# Patient Record
Sex: Female | Born: 1950
Health system: Southern US, Community
[De-identification: ages and names within clinical notes are randomized; demographics above are authoritative.]

## PROBLEM LIST (undated history)

## (undated) ENCOUNTER — Ambulatory Visit

## (undated) DIAGNOSIS — M1990 Unspecified osteoarthritis, unspecified site: Secondary | ICD-10-CM

## (undated) DIAGNOSIS — R531 Weakness: Secondary | ICD-10-CM

## (undated) DIAGNOSIS — J479 Bronchiectasis, uncomplicated: Secondary | ICD-10-CM

## (undated) DIAGNOSIS — F329 Major depressive disorder, single episode, unspecified: Secondary | ICD-10-CM

## (undated) DIAGNOSIS — N3941 Urge incontinence: Secondary | ICD-10-CM

## (undated) DIAGNOSIS — J45909 Unspecified asthma, uncomplicated: Secondary | ICD-10-CM

## (undated) DIAGNOSIS — E119 Type 2 diabetes mellitus without complications: Secondary | ICD-10-CM

## (undated) DIAGNOSIS — U071 COVID-19: Secondary | ICD-10-CM

## (undated) DIAGNOSIS — N390 Urinary tract infection, site not specified: Secondary | ICD-10-CM

## (undated) DIAGNOSIS — F5101 Primary insomnia: Secondary | ICD-10-CM

## (undated) DIAGNOSIS — R000 Tachycardia, unspecified: Secondary | ICD-10-CM

## (undated) DIAGNOSIS — G629 Polyneuropathy, unspecified: Secondary | ICD-10-CM

## (undated) DIAGNOSIS — I10 Essential (primary) hypertension: Secondary | ICD-10-CM

## (undated) DIAGNOSIS — G709 Myoneural disorder, unspecified: Secondary | ICD-10-CM

## (undated) DIAGNOSIS — B019 Varicella without complication: Secondary | ICD-10-CM

## (undated) DIAGNOSIS — K219 Gastro-esophageal reflux disease without esophagitis: Secondary | ICD-10-CM

## (undated) DIAGNOSIS — G4730 Sleep apnea, unspecified: Secondary | ICD-10-CM

## (undated) DIAGNOSIS — E782 Mixed hyperlipidemia: Secondary | ICD-10-CM

## (undated) DIAGNOSIS — G2581 Restless legs syndrome: Secondary | ICD-10-CM

## (undated) DIAGNOSIS — J189 Pneumonia, unspecified organism: Secondary | ICD-10-CM

## (undated) DIAGNOSIS — K589 Irritable bowel syndrome without diarrhea: Secondary | ICD-10-CM

## (undated) DIAGNOSIS — G603 Idiopathic progressive neuropathy: Secondary | ICD-10-CM

## (undated) DIAGNOSIS — F32A Depression, unspecified: Secondary | ICD-10-CM

## (undated) DIAGNOSIS — J9601 Acute respiratory failure with hypoxia: Secondary | ICD-10-CM

## (undated) HISTORY — DX: Acute respiratory failure with hypoxia: J96.01

## (undated) HISTORY — DX: Urge incontinence: N39.41

## (undated) HISTORY — DX: Irritable bowel syndrome, unspecified: K58.9

## (undated) HISTORY — DX: Bronchiectasis, uncomplicated: J47.9

## (undated) HISTORY — DX: Urinary tract infection, site not specified: N39.0

## (undated) HISTORY — DX: Type 2 diabetes mellitus without complications: E11.9

## (undated) HISTORY — DX: Gastro-esophageal reflux disease without esophagitis: K21.9

## (undated) HISTORY — DX: Primary insomnia: F51.01

## (undated) HISTORY — DX: Essential (primary) hypertension: I10

## (undated) HISTORY — DX: Major depressive disorder, single episode, unspecified: F32.9

## (undated) HISTORY — DX: Polyneuropathy, unspecified: G62.9

## (undated) HISTORY — PX: APPENDECTOMY: SHX54

## (undated) HISTORY — PX: REPLACEMENT TOTAL KNEE: SUR1224

## (undated) HISTORY — DX: Restless legs syndrome: G25.81

## (undated) HISTORY — DX: Depression, unspecified: F32.A

## (undated) HISTORY — DX: Mixed hyperlipidemia: E78.2

## (undated) HISTORY — PX: ABDOMINAL HYSTERECTOMY: SHX81

## (undated) HISTORY — DX: COVID-19: U07.1

## (undated) HISTORY — DX: Weakness: R53.1

## (undated) HISTORY — DX: Unspecified osteoarthritis, unspecified site: M19.90

## (undated) HISTORY — DX: Idiopathic progressive neuropathy: G60.3

## (undated) HISTORY — DX: Varicella without complication: B01.9

## (undated) HISTORY — PX: CHOLECYSTECTOMY: SHX55

## (undated) HISTORY — PX: TONSILLECTOMY: SUR1361

## (undated) HISTORY — DX: Tachycardia, unspecified: R00.0

## (undated) HISTORY — PX: HERNIA REPAIR: SHX51

---

## 2013-10-11 ENCOUNTER — Ambulatory Visit (INDEPENDENT_AMBULATORY_CARE_PROVIDER_SITE_OTHER): Payer: Self-pay | Admitting: Neurology

## 2013-10-11 ENCOUNTER — Encounter: Payer: Self-pay | Admitting: Neurology

## 2013-10-11 VITALS — BP 126/76 | HR 106 | Ht 63.0 in | Wt 194.0 lb

## 2013-10-11 DIAGNOSIS — R292 Abnormal reflex: Secondary | ICD-10-CM

## 2013-10-11 DIAGNOSIS — R262 Difficulty in walking, not elsewhere classified: Secondary | ICD-10-CM

## 2013-10-11 DIAGNOSIS — M62838 Other muscle spasm: Secondary | ICD-10-CM

## 2013-10-11 MED ORDER — CYCLOBENZAPRINE HCL 5 MG PO TABS: 5.0000 mg | ORAL_TABLET | Freq: Three times a day (TID) | ORAL | Status: DC | PRN

## 2013-10-11 NOTE — Patient Instructions (Signed)
1. EMG/NCV upper and lower extremity 2. Start Flexeril 5mg  at bedtime for muscle spasms 3. We will obtain records and discs of studies done at [facility] 4. Follow-up after EMG/NCV

## 2013-10-11 NOTE — Progress Notes (Signed)
NEUROLOGY CONSULTATION NOTE  Hailey Greene MRN: [id number] DOB: 01/29/1951  Referring provider: Dr. Malva Cogan [facility]) Primary care provider: none  Reason for consult:  Weakness, difficulty walking  Dear Dr Addison Lank:  Thank you for your kind referral of Hailey Greene for consultation of the above symptoms. Although her history is well known to you, please allow me to reiterate it for the purpose of our medical record. Records from [facility] ER were reviewed.  Patient was at [facility] x 2, records unavailable for review at this time.  HISTORY OF PRESENT ILLNESS: This is a pleasant 63 year old right-handed woman with no significant past medical history, no medical follow-up due to insurance issues, who reports that symptoms started last winter when she got depressed and saw a physician in [place] in June to ask for medication.  She does not recall medications prescribed, per records she was prescribed Celexa, Wellbutrin, Atarax, Ciprofloxacin, and Prilosec.  She states she was active, mobile, and independent prior to taking these medications, then she one day woke up and could not walk.  She stopped all the medications, however after a week continued to have difficulties ambulating.  She had been to [facility] twice, per [facility] records she was diagnosed with a UTI and anxiety on 6/25, CBC normal, Na, K, Cl low and treated, LFTs, CK, ammonia normal.  CT head negative.  She returned on 7/1 per records labs normal, MRI brain 6/29 negative.  She tells me that she had imaging of her spine but no results are available.  She had PT for 2 days in [facility] and was discharged home with PT exercises.  Her husband brought her to [facility] ER on 10/04/13 due to worsening mobility, now wheelchair-bound, and difficulty taking care of her.    She reports that she can move all her extremities without difficulty but cannot ambulate, "they won't  go where they are supposed to."  She has constant tingling on the right side of her face and both arms up to her elbows.  She denies any paresthesias or pain in her legs.  She has had difficulty sleeping because of uncontrollable kicking in both feet, and has been taking over the counter sleep medications.  She denies any bowel or bladder dysfunction, no perineal numbness.  She denies any neck or back pain.  She denies any shortness of breath except when she is moving around more. No dysarthria, dysphagia, ptosis, headaches, dizziness, diplopia.  No family history of similar symptoms.  She does endorse "mild depression" after her dogs died and her husband being sick recently.    PAST MEDICAL HISTORY: No past medical history on file.  PAST SURGICAL HISTORY: No past surgical history on file.  MEDICATIONS: No current outpatient prescriptions on file prior to visit.   No current facility-administered medications on file prior to visit.    ALLERGIES: No Known Allergies  FAMILY HISTORY: No family history on file.  SOCIAL HISTORY: History   Social History  . Marital Status: Married    Spouse Name: N/A    Number of Children: N/A  . Years of Education: N/A   Occupational History  . Not on file.   Social History Main Topics  . Smoking status: Former Smoker    Types: Cigarettes  . Smokeless tobacco: Not on file  . Alcohol Use: No  . Drug Use: No  . Sexual Activity: Not on file   Other Topics Concern  .  Not on file   Social History Narrative  . No narrative on file    REVIEW OF SYSTEMS: Constitutional: No fevers, chills, or sweats, no generalized fatigue, change in appetite Eyes: No visual changes, double vision, eye pain Ear, nose and throat: No hearing loss, ear pain, nasal congestion, sore throat Cardiovascular: No chest pain, palpitations Respiratory:  No shortness of breath at rest or with exertion, wheezes GastrointestinaI: No nausea, vomiting, diarrhea, abdominal pain,  fecal incontinence Genitourinary:  No dysuria, urinary retention or frequency Musculoskeletal:  No neck pain, back pain Integumentary: No rash, pruritus, skin lesions Neurological: as above Psychiatric: No depression, insomnia, anxiety Endocrine: No palpitations, fatigue, diaphoresis, mood swings, change in appetite, change in weight, increased thirst Hematologic/Lymphatic:  No anemia, purpura, petechiae. Allergic/Immunologic: no itchy/runny eyes, nasal congestion, recent allergic reactions, rashes  PHYSICAL EXAM: Filed Vitals:   10/11/13 1029  BP: 126/76  Pulse: 106   General: No acute distress Head:  Normocephalic/atraumatic Eyes: Fundoscopic exam shows bilateral sharp discs, no vessel changes, exudates, or hemorrhages Neck: supple, no paraspinal tenderness, full range of motion Back: No paraspinal tenderness Heart: regular rate and rhythm Lungs: Clear to auscultation bilaterally. Vascular: No carotid bruits. Skin/Extremities: No rash, no edema Neurological Exam: Mental status: alert and oriented to person, place, and time, no dysarthria or aphasia, Fund of knowledge is appropriate.  Recent and remote memory are intact.  Attention and concentration are normal.    Able to name objects and repeat phrases. Cranial nerves: CN I: not tested CN II: pupils equal, round and reactive to light, visual fields intact, fundi unremarkable. CN III, IV, VI:  full range of motion, no nystagmus, no ptosis CN V: facial sensation intact CN VII: upper and lower face symmetric CN VIII: hearing intact to finger rub CN IX, X: gag intact, uvula midline CN XI: sternocleidomastoid and trapezius muscles intact CN XII: tongue midline Bulk & Tone: normal, no fasciculations. Motor: 5/5 throughout with no pronator drift. Sensation: intact to light touch, cold, pin, vibration and joint position sense.  No extinction to double simultaneous stimulation.   Deep Tendon Reflexes: unable to elicit reflexes  throughout, no ankle clonus Plantar responses: downgoing bilaterally Cerebellar: no incoordination on finger to nose, heel to shin. No dysdiadochokinesia Gait: patient needed 2-person assist to stand up, she was able to hold herself up standing in place, then unable to take steps without support Tremor: none  IMPRESSION: This is a 63 year old right-handed woman with no significant past medical history presenting for inability to ambulate over the past month.  Her neurological exam shows 5/5 strength when sitting down, sensory exam normal, with note of absent reflexes throughout.  She is able to stand with assist, holds herself up when standing, but unable to take steps without support (?ataxic but difficult to determine due to limited ability to take steps). No other cerebellar signs on exam.  The etiology of her symptoms is unclear, she reports having an MRI of the brain and spine, however only MRI brain report is available from [facility] notes.  [facility] from [facility] will be requested for review.  EMG/NCV of the upper and lower extremity will be ordered to assess for polyradiculoneuropathy.  She is concerned about the spasms in her legs at night and will try a muscle relaxant to hopefully help with sleep as well.  She will continue with PT home exercises and will follow-up after the EMG.  Thank you for allowing me to participate in the care of  this patient. Please do not hesitate to call for any questions or concerns.   Patrcia Dolly, M.D.

## 2013-10-12 ENCOUNTER — Encounter: Payer: Self-pay | Admitting: Neurology

## 2013-10-12 DIAGNOSIS — R292 Abnormal reflex: Secondary | ICD-10-CM | POA: Insufficient documentation

## 2013-10-12 DIAGNOSIS — M62838 Other muscle spasm: Secondary | ICD-10-CM | POA: Insufficient documentation

## 2013-10-12 DIAGNOSIS — R262 Difficulty in walking, not elsewhere classified: Secondary | ICD-10-CM | POA: Insufficient documentation

## 2013-10-23 ENCOUNTER — Telehealth: Payer: Self-pay | Admitting: Neurology

## 2013-10-23 NOTE — Telephone Encounter (Signed)
Please let him know I would agree with seeing Behavioral Health, can you pls send referral. Also, we have not received records from [facility], pls request again. Thanks!

## 2013-10-23 NOTE — Telephone Encounter (Signed)
Pt's POA/husband called with concerns regarding patients mental health. He says she seems to be getting worse, still requiring daily help from a hired caregiver. Pt still having nightmares. Spouse reports pt is speaking to her father who has passed away. Spouse is very concerned and worried. Wonders if she needs sometime of Mental Health referral. Please call [phone] / Sherri S.

## 2013-10-23 NOTE — Telephone Encounter (Signed)
Please advise 

## 2013-10-30 ENCOUNTER — Other Ambulatory Visit: Payer: Self-pay | Admitting: *Deleted

## 2013-10-30 DIAGNOSIS — R4689 Other symptoms and signs involving appearance and behavior: Secondary | ICD-10-CM

## 2013-10-30 NOTE — Telephone Encounter (Signed)
Patients spouse notified of referral being made to [facility]

## 2013-11-11 ENCOUNTER — Ambulatory Visit: Payer: Self-pay | Admitting: Neurology

## 2013-11-20 ENCOUNTER — Ambulatory Visit (INDEPENDENT_AMBULATORY_CARE_PROVIDER_SITE_OTHER): Payer: Self-pay | Admitting: Neurology

## 2013-11-20 ENCOUNTER — Encounter: Payer: Self-pay | Admitting: Neurology

## 2013-11-20 DIAGNOSIS — G610 Guillain-Barre syndrome: Secondary | ICD-10-CM

## 2013-11-20 DIAGNOSIS — R262 Difficulty in walking, not elsewhere classified: Secondary | ICD-10-CM

## 2013-11-20 DIAGNOSIS — M62838 Other muscle spasm: Secondary | ICD-10-CM

## 2013-11-20 DIAGNOSIS — R292 Abnormal reflex: Secondary | ICD-10-CM

## 2013-11-20 NOTE — Procedures (Signed)
[facility]  [address] [place], [address]  [place], [place] [address] Tel: [phone] Fax:  [phone] Test Date:  11/20/2013  Patient: Hailey Greene DOB: 01/12/1951 Physician: Nita Sickle  Sex: Female Height:  Ref Phys: Patrcia Dolly  ID#: [id number] Temp: 34.0C Technician: Ala Bent R. NCS T.   Patient Complaints: Patient is a 63 year old female here for evaluation of bilateral leg and hand numbness and facial numbness.  NCV & EMG Findings: Extensive electrodiagnostic testing of the right upper and lower extremity shows:  1. Nearly all sensory responses including the median, ulnar, sural, and superficial peroneal sensory responses are absent. The radial sensory response is reduced in amplitude (R3.4 V). 2. Median motor study recorded from abductor pollicis brevis (APB) muscles obtained by stimulating the nerve at the wrist normal response, however proximal stimulation showed greater than 50% reduction in amplitude with decreased conduction velocity (Elbow-Wrist, 41 m/s), suggestive of conduction block in the forearm.  Additionally, recording from APB, the ulnar nerve was stimulated at the wrist and cubital tunnel and showed motor response, showing dual innervation of APB by median and ulnar nerves, consistent with an Riche-Cannieu anastomosis (RCA), a normal variant.  Right ulnar motor response recording at the abductor digiti minimi shows normal amplitude and latency, however there is decreased conduction velocity (A Elbow-B Elbow, 45 m/s). The ulnar motor response recording at the first dorsal interosseous muscle shows reduced amplitude (R4.71mV). 3. In the lower extremity, peroneal motor response shows reduced amplitude and conduction velocity at the extensor digitorum brevis and when recording at the tibialis anterior, there is prolonged distal onset latency (4.6 ms) and reduced amplitude (2.1 mV). The tibial motor response is within normal limits. 4. F wave responses  are within normal limits. H-reflex studies indicate that the left tibial H-reflex has prolonged latency (36.60 ms) and the right is absent.   5. Needle electrode examination shows chronic motor axon loss changes affecting the gastrocnemius, flexor digitorum longus and abductor digiti minimi muscles with active changes isolated to the medial gastrocnemius.   Impression: The electrophysiologic findings are most consistent with a patchy and subacute polyradiculoneuropathy, predominantly affecting sensory nerves greater than motor nerves. With respect to sensory abnormalities, these findings are severe in degree electrically.  Incidentally, there is evidence of a right Riche-Cannieu anastomosis, a normal variant.    ___________________________ Nita Sickle    Nerve Conduction Studies Anti Sensory Summary Table   Stim Site NR Peak (ms) Norm Peak (ms) P-T Amp (V) Norm P-T Amp  Right Median Anti Sensory (2nd Digit)  Wrist NR  <3.8  >10  Right Radial Anti Sensory (Base 1st Digit)  Wrist    2.8 <2.8 3.4 >10  Right Sup Peroneal Anti Sensory (Ant Lat Mall)  12 cm NR  <4.6  >3  Right Sural Anti Sensory (Lat Mall)  Calf NR  <4.6  >3  Right Ulnar Anti Sensory (5th Digit)  Wrist NR  <3.2  >5   Motor Summary Table   Stim Site NR Onset (ms) Norm Onset (ms) O-P Amp (mV) Norm O-P Amp Site1 Site2 Delta-0 (ms) Dist (cm) Vel (m/s) Norm Vel (m/s)  Right Median Motor (Abd Poll Brev)  Wrist    4.0 <4.0 10.7 >5 Elbow Wrist 5.6 23.0 41 >50  Elbow    9.6  5.5  Ulnar-wrist Elbow 4.9 0.0    Ulnar-wrist    4.7  7.9  Ulnar A-elbow Ulnar-wrist 4.8 0.0    Ulnar A-elbow    9.5  5.8         Right Peroneal Motor (Ext Dig Brev)  Ankle    4.7 <6.0 1.6 >2.5 B Fib Ankle 8.0 30.0 38 >40  B Fib    12.7  1.2  Poplt B Fib 2.7 9.0 33 >40  Poplt    15.4  1.0         Right Peroneal TA Motor (Tib Ant)  Fib Head    4.6 <4.5 2.1 >3 Poplit Fib Head 1.6 8.5 53 >40  Poplit    6.2  2.0         Post-exercise    2.7  2.7          Right Tibial Motor (Abd Hall Brev)  Ankle    4.4 <6.0 7.9 >4 Knee Ankle 8.9 37.0 42 >40  Knee    13.3  4.0         Right Ulnar Motor (Abd Dig Minimi)  Wrist    2.2 <3.1 7.7 >7 B Elbow Wrist 3.6 19.5 54 >50  B Elbow    5.8  6.9  A Elbow B Elbow 2.2 10.0 45 >50  A Elbow    8.0  6.9         Right Ulnar (FDI) Motor (1st DI)  Wrist    3.4 <4.5 4.1 >7         F Wave Studies   NR F-Lat (ms) Lat Norm (ms) L-R F-Lat (ms)  Right Tibial (Mrkrs) (Abd Hallucis)     54.54 <55   Right Ulnar (Mrkrs) (Abd Dig Min)     28.03 <33    H Reflex Studies   NR H-Lat (ms) Lat Norm (ms) L-R H-Lat (ms)  Left Tibial (Gastroc)     36.60 <35   Right Tibial (Gastroc)  NR  <35    EMG   Side Muscle Ins Act Fibs Psw Fasc Number Recrt Dur Dur. Amp Amp. Poly Poly. Comment  Right AntTibialis Nml Nml Nml Nml Nml Nml Nml Nml Nml Nml Nml Nml N/A  Right Gastroc Nml 1+ Nml Nml 1- Mod Nml Nml Nml Nml Nml Nml N/A  Right Flex Dig Long Nml Nml Nml Nml 1- Mod-R Some 1+ Some 1+ Nml Nml N/A  Right RectFemoris Nml Nml Nml Nml Nml Nml Nml Nml Nml Nml Nml Nml N/A  Right GluteusMed Nml Nml Nml Nml Nml Nml Nml Nml Nml Nml Nml Nml N/A  Right Lumbo Parasp Low Nml Nml Nml Nml Nml Nml Nml Nml Nml Nml Nml Nml N/A  Right 1stDorInt Nml Nml Nml Nml Nml Nml Nml Nml Nml Nml Nml Nml N/A  Right Abd Poll Brev Nml Nml Nml Nml Nml Nml Few 1+ Few 1- Nml Nml N/A  Right FlexPolLong Nml Nml Nml Nml Nml Nml Nml Nml Nml Nml Nml Nml N/A  Right Ext Indicis Nml Nml Nml Nml Nml Nml Nml Nml Nml Nml Nml Nml N/A  Right PronatorTeres Nml Nml Nml Nml Nml Nml Nml Nml Nml Nml Nml Nml N/A  Right Biceps Nml Nml Nml Nml Nml Nml Nml Nml Nml Nml Nml Nml N/A  Right Triceps Nml Nml Nml Nml Nml Nml Nml Nml Nml Nml Nml Nml N/A  Right Deltoid Nml Nml Nml Nml Nml Nml Nml Nml Nml Nml Nml Nml N/A  Right Cervical Parasp Low Nml Nml Nml Nml Nml Nml Nml Nml Nml Nml Nml Nml N/A  Right FlexDigProf 4,5 Nml Nml Nml Nml Nml Nml Nml Nml Nml Nml Nml Nml N/A  Right ABD Dig Min  Nml Nml Nml Nml 1- Mod Few 1+ Nml Nml Nml Nml N/A      Waveforms:

## 2013-11-20 NOTE — Progress Notes (Signed)
Hailey Greene brought in a note she wrote with more symptoms that have developed since her last visit with Dr. Karel Jarvis, she was here today for NCV/EMG studies. Note states as follows: hallucinations, dizziness, from elbows to fingertips tingling, legs and feet hurt badly.

## 2013-11-21 ENCOUNTER — Telehealth: Payer: Self-pay | Admitting: Neurology

## 2013-11-21 ENCOUNTER — Other Ambulatory Visit: Payer: Self-pay | Admitting: Family Medicine

## 2013-11-21 DIAGNOSIS — G610 Guillain-Barre syndrome: Secondary | ICD-10-CM

## 2013-11-21 NOTE — Telephone Encounter (Signed)
Called patient. Lmovm to return my call.

## 2013-11-21 NOTE — Telephone Encounter (Signed)
1-1/2 wks ago hallucinations , different each time. When she looks outside, straw people walking around her yard, not scary. Next time saw baby carriage. One time saw a guy with horse. No auditory hallucinations. When she turns, it goes away. None inside the house. Also started to have tingling on the both arms. Legs hurt, at night especially. Discussed EMG findings showing polyradiculoneuropathy. Would do more tests to further evaluated for cause of symptoms.  Tiffany, pls schedule MRI C-spine and L-spine with and without contrast for dx: polyradiculoneuropathy.  Also, pls schedule a lumbar puncture, send CSF for cell count, glucose, protein, IgG Index, oligoclonal bands, ACE level, Lyme Ab, flow cytometry, cytology.  She has a f/u on Monday, but let's get her scheduled for the tests. Thanks

## 2013-11-22 NOTE — Telephone Encounter (Signed)
Orders have been placed. Patient will receive a call from [facility] about LP appt. MRI scans have been scheduled for 12/09/13 @ [facility]. Patient has been made aware of MRI appts. F/U with Dr. Karel Jarvis has been moved to 12/25/13.

## 2013-11-25 ENCOUNTER — Ambulatory Visit: Payer: Self-pay | Admitting: Neurology

## 2013-11-28 ENCOUNTER — Encounter: Payer: Self-pay | Admitting: Neurology

## 2013-12-04 ENCOUNTER — Other Ambulatory Visit (HOSPITAL_COMMUNITY)
Admission: RE | Admit: 2013-12-04 | Discharge: 2013-12-04 | Disposition: A | Discharge status: Home / Self Care | Payer: Self-pay | Source: Ambulatory Visit | Attending: Neurology | Admitting: Neurology

## 2013-12-04 ENCOUNTER — Ambulatory Visit
Admission: RE | Admit: 2013-12-04 | Discharge: 2013-12-04 | Disposition: A | Discharge status: Home / Self Care | Payer: Self-pay | Source: Ambulatory Visit | Attending: Neurology | Admitting: Neurology

## 2013-12-04 ENCOUNTER — Other Ambulatory Visit: Payer: Self-pay | Admitting: *Deleted

## 2013-12-04 DIAGNOSIS — R292 Abnormal reflex: Secondary | ICD-10-CM

## 2013-12-04 DIAGNOSIS — R262 Difficulty in walking, not elsewhere classified: Secondary | ICD-10-CM

## 2013-12-04 DIAGNOSIS — M62838 Other muscle spasm: Secondary | ICD-10-CM

## 2013-12-04 DIAGNOSIS — G610 Guillain-Barre syndrome: Secondary | ICD-10-CM

## 2013-12-04 LAB — CSF CELL COUNT WITH DIFFERENTIAL
RBC COUNT CSF: 50 /mm3 — AB
Tube #: 1
WBC, CSF: 1 /mm3 (ref 0–5)

## 2013-12-04 LAB — PROTEIN, CSF: TOTAL PROTEIN, CSF: 30 mg/dL (ref 15–45)

## 2013-12-04 LAB — GLUCOSE, CSF: GLUCOSE CSF: 58 mg/dL (ref 43–76)

## 2013-12-04 MED ORDER — DIAZEPAM 5 MG PO TABS
10.0000 mg | ORAL_TABLET | Freq: Once | ORAL | Status: AC
  Administered 2013-12-04: 10 mg via ORAL

## 2013-12-04 NOTE — Progress Notes (Signed)
Two tiger-topped tubes of blood drawn for LP labs from right [facility] space without difficulty; site unremarkable.  Discharge instructions explained to patient and her husband.  Questions answered.  jkl

## 2013-12-04 NOTE — Discharge Instructions (Signed)
Lumbar Puncture Discharge Instructions  1. Go home and rest quietly for the next 24 hours.  It is important to lie flat for the next 24 hours.  Get up only to go to the restroom.  You may lie in the bed or on a couch on your back, your stomach, your left side or your right side.  You may have one pillow under your head.  You may have pillows between your knees while you are on your side or under your knees while you are on your back.  2. DO NOT drive today.  Recline the seat as far back as it will go, while still wearing your seat belt, on the way home.  3. You may get up to go to the bathroom as needed.  You may sit up for 10 minutes to eat.  You may resume your normal diet and medications unless otherwise indicated.  Drink plenty of extra fluids today and tomorrow.  4. The incidence of a spinal headache with nausea and/or vomiting is about 5% (one in 20 patients).  If you develop a headache, lie flat and drink plenty of fluids until the headache goes away.  Caffeinated beverages may be helpful.  If you develop severe nausea and vomiting or a headache that does not go away with flat bed rest, please call the physician who referred you here.  5. You may resume normal activities after your 24 hours of bed rest is over; however, do not exert yourself strongly or do any heavy lifting tomorrow.  Please call us at [phone] with any questions.  6. Call your physician for a follow-up appointment.

## 2013-12-06 LAB — ANGIOTENSIN CONVERTING ENZYME, CSF: ANGIO CONVERT ENZYME: 11 U/L (ref <=15)

## 2013-12-07 LAB — CSF IGG: IgG, CSF: 2.6 mg/dL (ref 0.8–7.7)

## 2013-12-09 ENCOUNTER — Ambulatory Visit (HOSPITAL_COMMUNITY)
Admission: RE | Admit: 2013-12-09 | Discharge: 2013-12-09 | Disposition: A | Discharge status: Home / Self Care | Payer: Self-pay | Source: Ambulatory Visit | Attending: Neurology | Admitting: Neurology

## 2013-12-09 DIAGNOSIS — G610 Guillain-Barre syndrome: Secondary | ICD-10-CM

## 2013-12-09 DIAGNOSIS — M47812 Spondylosis without myelopathy or radiculopathy, cervical region: Secondary | ICD-10-CM | POA: Insufficient documentation

## 2013-12-09 LAB — POCT I-STAT CREATININE: Creatinine, Ser: 1 mg/dL (ref 0.50–1.10)

## 2013-12-09 MED ORDER — GADOBENATE DIMEGLUMINE 529 MG/ML IV SOLN
20.0000 mL | Freq: Once | INTRAVENOUS | Status: AC | PRN
  Administered 2013-12-09: 18 mL via INTRAVENOUS

## 2013-12-10 LAB — OLIGOCLONAL BANDS, CSF + SERM

## 2013-12-11 ENCOUNTER — Telehealth: Payer: Self-pay | Admitting: Neurology

## 2013-12-11 NOTE — Telephone Encounter (Signed)
Discussed unremarkable MRI cervical and lumbar spine with patient. Discussed lumbar puncture results which are normal. I would like for her to see our neuromuscular specialist Dr. Allena Katz to weigh in for further evaluation and treatment. Patient expressed understanding.

## 2013-12-23 LAB — B. BURGDORFI ANTIBODIES, CSF: LYME AB: NEGATIVE

## 2013-12-24 ENCOUNTER — Ambulatory Visit (INDEPENDENT_AMBULATORY_CARE_PROVIDER_SITE_OTHER): Payer: Self-pay | Admitting: Neurology

## 2013-12-24 ENCOUNTER — Encounter: Payer: Self-pay | Admitting: Neurology

## 2013-12-24 ENCOUNTER — Telehealth: Payer: Self-pay | Admitting: Neurology

## 2013-12-24 VITALS — BP 120/74 | HR 102 | Ht 63.0 in | Wt 184.2 lb

## 2013-12-24 DIAGNOSIS — G609 Hereditary and idiopathic neuropathy, unspecified: Secondary | ICD-10-CM

## 2013-12-24 MED ORDER — NORTRIPTYLINE HCL 10 MG PO CAPS: 10.0000 mg | ORAL_CAPSULE | Freq: Every day | ORAL | Status: DC

## 2013-12-24 NOTE — Telephone Encounter (Signed)
Patient's husband notified that Rx has been sent to pharmacy.

## 2013-12-24 NOTE — Telephone Encounter (Signed)
Pt wants to knows if rx has been called in

## 2013-12-24 NOTE — Progress Notes (Signed)
[facility] Clinic Note - Initial Visit   Date: 12/24/2013  Hailey Greene MRN: [id number] DOB: 10/27/1950   Dear Dr. Delice Lesch:  Thank you for your kind referral of Hailey Greene for consultation of sensory neuropathy. Although her history is well known to you, please allow [place] to reiterate it for the purpose of our medical record. The patient was accompanied to the clinic by self.   History of Present Illness: Hailey Greene is a 63 y.o. right-handed Caucasian female with history of depression presenting for evaluation of sensory ataxia and gait problems.    She reports suffering from depression during early 2015.  Her husband suffered a heart attack in December and then kidney stone in March 2015.  She also lost two of her dogs very close to each other. She went to see her PCP because of problems with low energy, depression, and abdominal pain.  She was started on Celexa, Wellbutrin, Atarax, Ciprofloxacin, and Prilosec. She took it for a week and reported that it was not working. She was switched to another medication.  Prior to taking these medications, she was very active and healthy with no medical problems.  Overnight, she developed generalized shaking of her body and weakness.  Her husband and friend carried her to the car and took her to the emergency department at which time all her medications were stopped. She was readmitted one week later to [facility] and was diagnosed with UTI and anxiety on 6/25.  Labs including CBC normal, Na, K, Cl low and treated, LFTs, CK, ammonia normal. CT head negative. She returned on 7/1 per records labs normal, MRI brain 6/29 negative. She had PT for 2 days in [facility] and was discharged home with PT exercises. Again, she was admitted on 10/04/13 due to worsening mobility, now wheelchair-bound, and difficulty taking care of her.   Currently, she feels that she her legs are stronger than before.  She is  able to use a walker at home, but also has a wheelchair.    She is able to feed herself, bathe, and dress herself. She has not done any rehab because of financial reasons.  Initially she was having positional dizziness, but that has improved over the past few months.  She has numbness/tingling of the hands only and says that sensation of her fingers are like "brillo pads".  Denies any abnormal sensation of the feet.  She had difficulty with fine motor movement and is dropping things.  She had one fall last week and was able to stand up herself.    She is not update with her cancer screening.  She reports drinking 3 glasses of wine per day from December until April.  She saw Dr. Delice Lesch for these complaints in early July.  EMG performed by my self was concerning for sensory polyradiculoneuropathy so she underwent CSF testing which did not show any signs of inflammation or elevated protein.  Patient referred to be for further evaluation.   PMHx: None  PSHx: Appendectomy, hysterectomy, and tonsillectomy  Allergies:  NKDA  Medication:  mulitivitamin  Social History:  Lives with husband of 17 years. No children.  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  She quit smoking in 2010 and was previously smoking 1ppd for 5 years.  Highest level of education:  high school  Family history:  No family history of neuropathy.    Review of Systems:  CONSTITUTIONAL: No fevers, chills, night sweats, or weight loss.   EYES: No visual changes or eye pain ENT: No hearing changes.  No history of nose bleeds.   RESPIRATORY: No cough, wheezing and shortness of breath.   CARDIOVASCULAR: Negative for chest pain, and palpitations.   GI: Negative for abdominal discomfort, blood in stools or black stools.  No recent change in bowel habits.   GU:  No history of incontinence.   MUSCLOSKELETAL: +history of joint pain or swelling.  No myalgias.     SKIN: Negative for lesions, rash, and itching.   HEMATOLOGY/ONCOLOGY: Negative for prolonged bleeding, bruising easily, and swollen nodes.  No history of cancer.   ENDOCRINE: Negative for cold or heat intolerance, polydipsia or goiter.   PSYCH:  +depression or anxiety symptoms.   NEURO: As Above.   Vital Signs:  BP 120/74  Pulse 102  Ht '5\' 3"'  (1.6 m)  Wt 184 lb 4 oz (83.575 kg)  BMI 32.65 kg/m2  SpO2 97%  Neurological Exam: MENTAL STATUS including orientation to time, place, person, recent and remote memory, attention span and concentration, language, and fund of knowledge is normal.  Speech is not dysarthric.  CRANIAL NERVES: II:  No visual field defects.  Unremarkable fundi.   III-IV-VI: Pupils equal round and reactive to light.  Normal conjugate, extra-ocular eye movements in all directions of gaze.  No nystagmus.  No ptosis. V:  Normal facial sensation.   VII:  Normal facial symmetry and movements.  No pathologic facial reflexes.  VIII:  Normal hearing and vestibular function.   IX-X:  Normal palatal movement.   XI:  Normal shoulder shrug and head rotation.   XII:  Normal tongue strength and range of motion, no deviation or fasciculation.  MOTOR:  No atrophy, fasciculations or abnormal movements.  No pronator drift.  Tone is normal.    Right Upper Extremity:    Left Upper Extremity:    Deltoid  5/5   Deltoid  5/5   Biceps  5/5   Biceps  5/5   Triceps  5/5   Triceps  5/5   Wrist extensors  5/5   Wrist extensors  5/5   Wrist flexors  5/5   Wrist flexors  5/5   Finger extensors  5/5   Finger extensors  5/5   Finger flexors  5/5   Finger flexors  5/5   Dorsal interossei  4/5   Dorsal interossei  4/5   Abductor pollicis  5/5   Abductor pollicis  5/5   Tone (Ashworth scale)  0  Tone (Ashworth scale)  0   Right Lower Extremity:    Left Lower Extremity:    Hip flexors  4/5   Hip flexors  4/5   Adductor 4/5  Adductor 4/5  Hip extensors  5/5   Hip extensors  5/5   Knee  flexors  5/5   Knee flexors  5/5   Knee extensors  5/5   Knee extensors  5/5   Dorsiflexors  5/5   Dorsiflexors  5/5   Plantarflexors  5/5   Plantarflexors  5/5   Toe extensors  5/5   Toe extensors  5/5   Toe flexors  5/5   Toe flexors  5/5   Tone (Ashworth scale)  0  Tone (Ashworth scale)  0   MSRs:  Right  Left brachioradialis 2+  brachioradialis 2+  biceps 2+  biceps 2+  triceps 2+  triceps 2+  patellar 0  Patellar 0  ankle jerk 0  ankle jerk 0  Hoffman no  Hoffman no  plantar response down  plantar response down   SENSORY:  Pin prick and vibration diminished distal to knees bilaterally.  COORDINATION/GAIT: Normal finger-to- nose-finger and heel-to-shin.  Intact rapid alternating movements bilaterally.  Able to rise from a chair with using arms, but too unsteady to walk.   Data: CSF 12/09/2013: W1 R50 G58  P30, Lyme negative, ACE 11, IgG inex 2.6, OCB none, cytology neg  EMG 11/20/2913: The electrophysiologic findings are most consistent with a patchy and subacute polyradiculoneuropathy, predominantly affecting sensory nerves greater than motor nerves. With respect to sensory abnormalities, these findings are severe in degree electrically.  Incidentally, there is evidence of a right Riche-Cannieu anastomosis, a normal variant.   MRI lumbar spine wwo contrast 12/09/2013: 1. Moderate facet disease at L4-5 with bilateral facet joint effusions and synovial enhancement. There is an extraforaminal annular fissure on the left without resulting L4 nerve root encroachment.  2. Otherwise mild lumbar spondylosis with disc bulging and facet hypertrophy from L1-2 through L3-4. No significant spinal stenosis or nerve root encroachment.   MRI cervical spine wwo contrast 12/09/2013:  Mild multilevel cervical spondylosis    IMPRESSION: Mrs. Woodstock is a 63 year-old female presenting for evaluation of subacute onset of weakness and  sensory loss, starting in April 2015.  Around the same time, she was being treated for depression and UTI and her symptoms started very abruptly after taking these medications (Celexa, Wellbutrin, Atarax, Ciprofloxacin, and Prilosec), but she does not recall specifically if there was any particular one which may have triggered symptoms.  Her neurological exam shows a sensory neuropathy affecting the hands and feet which is consistent with her EMG (severe polyradiculoneuropathy affecting upper and lower extremity), except that there is also proximal leg weakness. Imaging shows multilevel disc bulge without spinal stenosis involving the lumbar region, which seems too mild to be causing her proximal leg weakness.  Her CSF studies do not show elevated protein or inflammatory changes, making AIDP/GBS less likely.  Several factors that could possibly contribute to sensory neuropathy:  (1) She reports to drinking 3 glasses of wine nightly for several months prior to onset of symptoms (December - April), (2) medication effect? (3) and need to exclude other possibilities such as vitamin deficiencies, autoimmune disorders (Srjogren's, Lupus), celiac disease, heavy metal toxicity, and possibly paraneoplastic sensory neuropathy, also.     PLAN/RECOMMENDATIONS:  1.  Check ESR, vitamin B12, vitamin B1, vitamin B6, ANA, SSA/B, celiac panel, copper, heavy metal screen, TSH, SPEP/UPEP with IFE, glucose tolerance test 2.  Going forward consider checking cryoglobulins, ANCA, RPR 3.  Start physical therapy for leg strengthening 4.  Encouraged to have cancer screening such as mammogram and colonoscopy by her PCP 5.  Return to clinic in 2-3 months.   The duration of this appointment visit was 60 minutes of face-to-face time with the patient.  Greater than 50% of this time was spent in counseling, explanation of diagnosis, planning of further management, and coordination of care.   Thank you for allowing me to  participate in patient's care.  If I can answer any additional questions, I would be pleased to do so.    Sincerely,    Donika K. Posey Pronto, DO

## 2013-12-25 ENCOUNTER — Other Ambulatory Visit: Payer: Self-pay | Admitting: *Deleted

## 2013-12-25 ENCOUNTER — Ambulatory Visit: Payer: Self-pay | Admitting: Neurology

## 2013-12-25 DIAGNOSIS — G609 Hereditary and idiopathic neuropathy, unspecified: Secondary | ICD-10-CM

## 2013-12-25 NOTE — Progress Notes (Signed)
Labs ordered and patient notified.  She said she would come in for labs at the beginning of next week.

## 2014-01-07 ENCOUNTER — Telehealth: Payer: Self-pay | Admitting: *Deleted

## 2014-01-07 ENCOUNTER — Telehealth: Payer: Self-pay | Admitting: Neurology

## 2014-01-07 ENCOUNTER — Other Ambulatory Visit: Payer: Self-pay | Admitting: Neurology

## 2014-01-07 LAB — TSH: TSH: 3.984 u[IU]/mL (ref 0.350–4.500)

## 2014-01-07 LAB — VITAMIN B12: VITAMIN B 12: 430 pg/mL (ref 211–911)

## 2014-01-07 LAB — SEDIMENTATION RATE: SED RATE: 40 mm/h — AB (ref 0–22)

## 2014-01-07 NOTE — Telephone Encounter (Signed)
Spoke with patient's husband and he said that they don't have money to do the other labs right now.  Informed  him that he may be able to get financial assistance.  Verlon AuLeslie in the lab instructed him to take his paper work to the [facility] financial assistance where they are currently getting help to see if they will cover part of the cost.  He agreed to try this.

## 2014-01-07 NOTE — Telephone Encounter (Signed)
Onalee Huaavid, Pt's spouse called requesting to speak to a nurse regarding the facility where she needs to have her lab work. C/B [phone]

## 2014-01-07 NOTE — Telephone Encounter (Signed)
Magda Paganini from [facility] called to let us know that patient is self pay and her labs would cost in the thousands.  Dr. Posey Pronto said that patient could just get the B12, ssa and ssb, TSH, copper and ESR today.  Magda Paganini will check the prices and let the patient know.

## 2014-01-08 LAB — SJOGREN'S SYNDROME ANTIBODS(SSA + SSB)
SSA (Ro) (ENA) Antibody, IgG: <1
SSB (La) (ENA) Antibody, IgG: <1

## 2014-01-08 LAB — COPPER, SERUM: Copper: 127 ug/dL (ref 70–175)

## 2014-01-17 ENCOUNTER — Telehealth: Payer: Self-pay | Admitting: Neurology

## 2014-01-17 NOTE — Telephone Encounter (Signed)
Called and discussed labs results are normal.  I would ideally like to order the remainder of the labs, but due to finanacial reasons, she is unable to afford any more testing.  They are looking into financial hardship resources.  She is very eager on trying steroids for treatment, which I am not opposed to but would like to complete the remaining of serology testing.  At this juncture, she expresses understanding that without knowing the underlying etiology, it's difficult to determine treatment options but would still like to try steroids.    We will look into the cost of solumedrol 1g once weekly for three weeks so she can decide if this is feasible and also let us know about additional blood work.  Hailey Fennell K. Allena KatzPatel, DO

## 2014-01-23 NOTE — Telephone Encounter (Signed)
I called Hailey Greene at short stay and she gave me the number to the pre-cert office for patient to call and answer some financial questions.  Called patient and gave her the number.  [phone])  She said that her husband has been over to speak with someone in the financial department and they told him she may be covered for everything.  She will call me back as soon as she finds out for sure and then go get her blood work.  Informed her that maybe we can schedule the injections after we get the lab results.

## 2014-02-05 ENCOUNTER — Ambulatory Visit (INDEPENDENT_AMBULATORY_CARE_PROVIDER_SITE_OTHER): Payer: Self-pay | Admitting: Neurology

## 2014-02-05 ENCOUNTER — Encounter: Payer: Self-pay | Admitting: Neurology

## 2014-02-05 VITALS — BP 130/78 | HR 88 | Ht 63.0 in | Wt 196.5 lb

## 2014-02-05 DIAGNOSIS — G549 Nerve root and plexus disorder, unspecified: Secondary | ICD-10-CM

## 2014-02-05 DIAGNOSIS — R278 Other lack of coordination: Secondary | ICD-10-CM

## 2014-02-05 DIAGNOSIS — G8220 Paraplegia, unspecified: Secondary | ICD-10-CM

## 2014-02-05 DIAGNOSIS — G610 Guillain-Barre syndrome: Secondary | ICD-10-CM

## 2014-02-05 MED ORDER — NORTRIPTYLINE HCL 25 MG PO CAPS: 25.0000 mg | ORAL_CAPSULE | Freq: Every day | ORAL | Status: DC

## 2014-02-05 NOTE — Progress Notes (Signed)
Follow-up Visit   Date: 02/05/2014    Hailey Greene MRN: [id number] DOB: 1950/06/23   Interim History: Hailey Greene is a 63 y.o. right-handed Caucasian female with history of depression returning to the clinic for follow-up of sensory neuropathy with paraparesis.  The patient was accompanied to the clinic by her husband who also provides collateral information.    History of present illness: She reports suffering from depression during early 2015. Her husband suffered a heart attack in December and then kidney stone in March 2015. She also lost two of her dogs very close to each other. She went to see her PCP because of problems with low energy, depression, and abdominal pain. She was started on Celexa, Wellbutrin, Atarax, Ciprofloxacin, and Prilosec. She took it for a week and reported that it was not working. She was switched to another medication. Prior to taking these medications, she was very active and healthy with no medical problems. Overnight, she developed generalized shaking of her body and weakness. Her husband and friend carried her to the car and took her to the emergency department at which time all her medications were stopped. She was readmitted one week later to [facility] and was diagnosed with UTI and anxiety on 6/25. Labs including CBC normal, Na, K, Cl low and treated, LFTs, CK, ammonia normal. CT head negative. She returned on 7/1 per records labs normal, MRI brain 6/29 negative. She had PT for 2 days in [facility] and was discharged home with PT exercises. Again, she was admitted on 10/04/13 due to worsening mobility, now wheelchair-bound, and difficulty taking care of her.   Currently, she feels that she her legs are stronger than before. She is able to use a walker at home, but also has a wheelchair. She is able to feed herself, bathe, and dress herself. She has not done any rehab because of financial reasons. Initially she was having  positional dizziness, but that has improved over the past few months.  She has numbness/tingling of the hands only and says that sensation of her fingers are like "brillo pads". Denies any abnormal sensation of the feet. She had difficulty with fine motor movement and is dropping things. She had one fall last week and was able to stand up herself.   She is not update with her cancer screening. She reports drinking 3 glasses of wine per day from December until April.  She saw Dr. Delice Lesch for these complaints in early July. EMG performed by my self was concerning for sensory polyradiculoneuropathy so she underwent CSF testing which did not show any signs of inflammation or elevated protein. Patient referred to be for further evaluation.  UPDATE 02/05/2014:  Since her last visit a month ago, she reports making slightly positive improvement, such that she is now able to bathe and dress herself.  She is able to walk with a walker much better and is less dependent on her husband.  She cannot walk unassisted due to imbalance, but does not complain of weakness.  No interval falls.  Her paresthesias of her hands have also improved slightly, but numbness of the legs remains unchanged.  Her husband is still working on Dietitian clearance from [place], but reports should be approved in the next 1-2 weeks.  For this reason, all her blood work which was ordered was not completed.      Medications:  Nortriptyline $RemoveBefore'10mg'[id number]$  qhs  Allergies: No Known Allergies  Review of Systems:  CONSTITUTIONAL: No fevers,  chills, night sweats, or weight loss.   EYES: No visual changes or eye pain ENT: No hearing changes.  No history of nose bleeds.   RESPIRATORY: No cough, wheezing and shortness of breath.   CARDIOVASCULAR: Negative for chest pain, and palpitations.   GI: Negative for abdominal discomfort, blood in stools or black stools.  No recent change in bowel habits.   GU:  No history of incontinence.     MUSCLOSKELETAL: No history of joint pain or swelling.  No myalgias.   SKIN: Negative for lesions, rash, and itching.   ENDOCRINE: Negative for cold or heat intolerance, polydipsia or goiter.   PSYCH:  No depression or anxiety symptoms.   NEURO: As Above.   Vital Signs:  BP 130/78 mmHg  Pulse 88  Ht $R'5\' 3"'AH$  (1.6 m)  Wt 196 lb 8 oz (89.132 kg)  BMI 34.82 kg/m2  SpO2 99%  Neurological Exam: MENTAL STATUS including orientation to time, place, person, recent and remote memory, attention span and concentration, language, and fund of knowledge is normal.  Speech is not dysarthric.  CRANIAL NERVES: No visual field defects. Pupils equal round and reactive to light.  Normal conjugate, extra-ocular eye movements in all directions of gaze.  No ptosis. Normal facial sensation.  Face is symmetric. Palate elevates symmetrically.  Tongue is midline.  MOTOR:  No atrophy, fasciculations or abnormal movements.  No pronator drift.  Tone is normal.    Right Upper Extremity:    Left Upper Extremity:    Deltoid  5/5   Deltoid  5/5   Biceps  5/5   Biceps  5/5   Triceps  5/5   Triceps  5/5   Wrist extensors  5/5   Wrist extensors  5/5   Wrist flexors  5/5   Wrist flexors  5/5   Finger extensors  5/5   Finger extensors  5/5   Finger flexors  5/5   Finger flexors  5/5   Dorsal interossei  5/5   Dorsal interossei  5/5   Abductor pollicis  5/5   Abductor pollicis  5/5   Tone (Ashworth scale)  0  Tone (Ashworth scale)  0   Right Lower Extremity:    Left Lower Extremity:    Hip flexors  5-/5   Hip flexors  5-/5 (improved)  Hip extensors  5/5   Hip extensors  5/5  (improved)  Knee flexors  5/5   Knee flexors  5/5   Knee extensors  5/5   Knee extensors  5/5   Dorsiflexors  5/5   Dorsiflexors  5/5   Plantarflexors  5/5   Plantarflexors  5/5   Toe extensors  5/5   Toe extensors  5/5   Toe flexors  5/5   Toe flexors  5/5   Tone (Ashworth scale)  0  Tone (Ashworth scale)  0    MSRs:  Right                                                                  Left brachioradialis 2+  brachioradialis 2+  biceps 2+  biceps 2+  triceps 2+  triceps 2+  patellar 0  Patellar 0  ankle jerk 0  ankle jerk 0  Hoffman no  Hoffman no  plantar response down  plantar response down   SENSORY:  Vibration intact at MCP, right knee 70%, left knee 100%, and absent at ankles bilaterally.  Pin prick intact in the upper extremities and remains absent in the upper and distal legs.  Proprioception is impaired at great toe bilaterally, intact at the ankles.  GAIT:  She is able to stand unassisted, but needs assistance to walk due to gait ataxia  Data: CSF 12/09/2013: W1 R50 G58 P30, Lyme negative, ACE 11, IgG inex 2.6, OCB none, cytology neg  EMG 11/20/2913: The electrophysiologic findings are most consistent with a patchy and subacute polyradiculoneuropathy, predominantly affecting sensory nerves greater than motor nerves. With respect to sensory abnormalities, these findings are severe in degree electrically.  Incidentally, there is evidence of a right Riche-Cannieu anastomosis, a normal variant.   MRI lumbar spine wwo contrast 12/09/2013: 1. Moderate facet disease at L4-5 with bilateral facet joint effusions and synovial enhancement. There is an extraforaminal annular fissure on the left without resulting L4 nerve root encroachment.  2. Otherwise mild lumbar spondylosis with disc bulging and facet hypertrophy from L1-2 through L3-4. No significant spinal stenosis or nerve root encroachment.   MRI cervical spine wwo contrast 12/09/2013: Mild multilevel cervical spondylosis   Labs 01/07/2014:  SSA/B neg, ESR 40, TSH 3.9, vitamin B12 430, copper 127,   IMPRESSION: Hailey Greene is a 63 year-old female returning for evaluation of sensory polyradiculoneuropathy manifesting as subacute onset of weakness and sensory loss, starting in April 2015. Around the same time, she was being treated for depression and UTI and her symptoms  started very abruptly after taking these medications (Celexa, Wellbutrin, Atarax, Ciprofloxacin, and Prilosec), but she does not recall specifically if there was any particular one which may have triggered symptoms.  Her neurological exam today shows improved motor strength of her proximal leg and intrinsic hand muscles. Has sensory deficits also showing improvement such that vibration is now present and her knees but remains absent distal to ankles bilaterally. Head greatest impairment continues to be secondary to sensory ataxia and gait imbalance. Slowly, she is now able to perform her own basic ADLs and is less dependent on her husband except for when walking unassisted.  Unfortunately, due to financial constraints, I have been unable to obtain complete serological testing for neuropathy. Preliminary workup looking for Sjogren's disease, inflammatory screening conditions. Thyroid disease, vitamin and cooperative deficiency has been normal.  Her CSF studies do not show elevated protein or inflammatory changes, making AIDP/GBS less likely.  Overall, I am pleased that she is showing slight improvement and there has been no progression since her last visit. At this time I would like to wait until she obtains financial clearance and will complete laboratory evaluation looking for other potential causes of neuropathy. Since there has been steady improvement without any intervention, alcohol-induced neuropathy versus medication effectis definitely possible.  I will continue to follow her clinically and see how symptoms evolve.    PLAN/RECOMMENDATIONS:  1. Check vitamin B1, vitamin B6, RPR, ANA, celiac panel, heavy metal screen, SPEP/UPEP with IFE, glucose tolerance test when financially approved for assistance 2. Increase notriptyline 25mg  at bedtime 3.  Literature on fall precautions provided 4.  Return to clinic in 10-months   The duration of this appointment visit was 30 minutes of face-to-face time  with the patient.  Greater than 50% of this time was spent in counseling, explanation of diagnosis, planning of further management, and coordination of care.   Thank you for allowing  me to participate in patient's care.  If I can answer any additional questions, I would be pleased to do so.    Sincerely,    Baeleigh Devincent K. Posey Pronto, DO

## 2014-02-05 NOTE — Patient Instructions (Addendum)
1.  Increase notriptyline 25mg  at bedtime 2.  Please let me know when your financial assistance is approved, so we can move forward with additional laboratory testing 3.  Return to clinic in 62-months   Neurology  Preventing Falls in the Home   Falls are common, often dreaded events in the lives of older people. Aside from the obvious injuries and even death that may result, falls can cause wide-ranging consequences including loss of independence, mental decline, decreased activity, and mobility. Younger people are also at risk of falling, especially those with chronic illnesses and fatigue.  Ways to reduce the risk for falling:  * Examine diet and medications. Warm foods and alcohol dilate blood vessels, which can lead to dizziness when standing. Sleep aids, antidepressants, and pain medications can also increase the likelihood of a fall.  * Get a vison exam. Poor vision, cataracts, and glaucoma increase the chances of falling.  * Check foot gear. Shoes should fit snugly and have a sturdy, nonskid sole and broad, low heel.  * Participate in a physician-approved exercise program to build and maintain muscle strength and improve balance and coordination.  * Increase vitamin D intake. Vitamin D improves muscle strength and increases the amount of calcium the body is able to absorb and deposit in bones.  How to prevent falls from common hazards:  * Floors - Remove all loose wires, cords, and throw rugs. Minimize clutter. Make sure rugs are anchored and smooth. Keep furniture in its usual place.  * Chairs - Use chairs with straight backs, armrests, and firm seats. Add firm cushions to existing pieces to add height.  * Bathroom - Install grab bars and non-skid tape in the tub or shower. Use a bathtub transfer bench or a shower chair with a back support. Use an elevated toilet seat and/or safety rails to assist standing from a low surface. Do not use towel racks or bathroom tissue holders to help  you stand.  * Lighting - Make sure halls, stairways, and entrances are well-lit. Install a night light in your bathroom or hallway. Make sure there is a light switch at the top and bottom of the staircase. Turn lights on if you get up in the middle of the night. Make sure lamps or light switches are within reach of the bed if you have to get up during the night.  * Kitchen - Install non-skid rubber mats near the sink and stove. Clean spills immediately. Store frequently used utensils, pots, and pans between waist and eye level. This helps prevent reaching and bending. Sit when getting things out of the lower cupboards.  * Living room / Bedrooms - Place furniture with wide spaces in between, giving enough room to move around. Establish a route through the living room that gives you something to hold onto as you walk.  * Stairs - Make sure treads, rails, and rugs are secure. Install a rail on both sides of the stairs. If stairs are a threat, it might be helpful to arrange most of your activities on the lower level to reduce the number of times you must climb the stairs.  * Entrances and doorways - Install metal handles on the walls adjacent to the doorknobs of all doors to make it more secure as you travel through the doorway.  Tips for maintaining balance:  * Keep at least one hand free at all times Try using a backpack or fanny pack to hold things rather than carrying them in your hands.  Never carry objects in both hands when walking as this interferes with keeping your balance.  * Attempt to swing both arms from front to back while walking. This might require a conscious effort if Parkinson's disease has diminished your movement. It will, however, help you to maintain balance and posture, and reduce fatigue.  * Consciously lift your feet off the ground when walking. Shuffling and dragging of the feet is a common culprit in losing your balance.  * When trying to navigate turns, use a "U" technique of  facing forward and making a wide turn, rather than pivoting sharply.  * Try to stand with your feet shoulder-length apart. When your feet are close together for any length of time, you increase your risk of losing your balance and falling.  * Do one thing at a time. Do not try to walk and accomplish another task, such as reading or looking around. The decrease in your automatic reflexes complicates motor function, so the less distraction, the better.  * Do not wear rubber or gripping soled shoes, they might "catch" on the floor and cause tripping.  * Move slowly when changing positions. Use deliberate, concentrated movements and, if needed, use a grab bar or walking aid. Count fifteen (15) seconds after standing to begin walking.  * If balance is a continuous problem, you might want to consider a walking aid such as a cane, walking stick, or walker. Once you have mastered walking with help, you may be ready to try it again on your own.  This information is provided by [facility] and is not intended to replace the medical advice of your physician or other health care providers. Please consult your physician or other health care providers for advice regarding your specific medical condition.

## 2014-02-25 ENCOUNTER — Telehealth: Payer: Self-pay | Admitting: Neurology

## 2014-02-25 NOTE — Telephone Encounter (Signed)
Left message for patient to call me back. 

## 2014-02-25 NOTE — Telephone Encounter (Signed)
The patient is now covered with [facility] financial aid and would like to finish up lab work.  Which labs are we waiting on doing?

## 2014-02-25 NOTE — Telephone Encounter (Signed)
Pt called and wants to talk to you about the blood work please call [phone]

## 2014-02-25 NOTE — Telephone Encounter (Signed)
Pt returning your call, please call back / Sherri S.

## 2014-02-26 ENCOUNTER — Other Ambulatory Visit: Payer: Self-pay | Admitting: *Deleted

## 2014-02-26 DIAGNOSIS — R292 Abnormal reflex: Secondary | ICD-10-CM

## 2014-02-26 DIAGNOSIS — M62838 Other muscle spasm: Secondary | ICD-10-CM

## 2014-02-26 DIAGNOSIS — R262 Difficulty in walking, not elsewhere classified: Secondary | ICD-10-CM

## 2014-02-26 NOTE — Telephone Encounter (Signed)
Left message for patient to call me back. 

## 2014-02-26 NOTE — Telephone Encounter (Signed)
Pt is returning your call please call her back  

## 2014-02-26 NOTE — Telephone Encounter (Signed)
Patient informed that lab orders are ready.

## 2014-02-26 NOTE — Telephone Encounter (Signed)
That's great  - let's check Vitamin B1, vitamin B6, RPR, ANA, celiac panel, heavy metal screen, SPEP/UPEP with IFE, glucose tolerance test.

## 2014-02-27 NOTE — Telephone Encounter (Signed)
Pt called/returning your call regarding her blood work. [phone]

## 2014-02-27 NOTE — Telephone Encounter (Signed)
Patient called to let us know that she is having her lab work done on 03-11-14.

## 2014-03-11 ENCOUNTER — Other Ambulatory Visit: Payer: Self-pay | Admitting: *Deleted

## 2014-03-11 ENCOUNTER — Other Ambulatory Visit: Payer: Self-pay

## 2014-03-11 DIAGNOSIS — G610 Guillain-Barre syndrome: Secondary | ICD-10-CM

## 2014-03-11 LAB — GLUCOSE TOLERANCE, 2 HOURS
Glucose, 1 Hour GTT: 229 mg/dL
Glucose, 2 hour: 217 mg/dL
Glucose, Fasting: 102 mg/dL — ABNORMAL HIGH (ref 70–99)

## 2014-03-12 LAB — GLIADIN ANTIBODIES, SERUM
GLIADIN IGG: 2 U (ref <20)
Gliadin IgA: 5 Units (ref <20)

## 2014-03-12 LAB — TISSUE TRANSGLUTAMINASE, IGA: Tissue Transglutaminase Ab, IgA: 1 U/mL (ref <4)

## 2014-03-12 LAB — RPR

## 2014-03-12 LAB — ANA: ANA: NEGATIVE

## 2014-03-13 LAB — HEAVY METALS, BLOOD: LEAD: 3 ug/dL (ref <10)

## 2014-03-13 LAB — UIFE/LIGHT CHAINS/TP QN, 24-HR UR
Albumin, U: DETECTED
TOTAL PROTEIN, URINE-UPE24: 4 mg/dL — AB (ref 5–24)

## 2014-03-14 LAB — VITAMIN B6: Vitamin B6: 27.6 ng/mL — ABNORMAL HIGH (ref 2.1–21.7)

## 2014-03-14 LAB — SPEP & IFE WITH QIG
ALBUMIN ELP: 56.1 % (ref 55.8–66.1)
ALPHA-1-GLOBULIN: 5.6 % — AB (ref 2.9–4.9)
Alpha-2-Globulin: 13.4 % — ABNORMAL HIGH (ref 7.1–11.8)
Beta 2: 6 % (ref 3.2–6.5)
Beta Globulin: 7.1 % (ref 4.7–7.2)
Gamma Globulin: 11.8 % (ref 11.1–18.8)
IgA: 192 mg/dL (ref 69–380)
IgG (Immunoglobin G), Serum: 786 mg/dL (ref 690–1700)
IgM, Serum: 114 mg/dL (ref 52–322)
Total Protein, Serum Electrophoresis: 6.8 g/dL (ref 6.0–8.3)

## 2014-03-14 LAB — RETICULIN ANTIBODIES, IGA W TITER: RETICULIN AB, IGA: NEGATIVE

## 2014-03-15 LAB — VITAMIN B1: VITAMIN B1 (THIAMINE): 43 nmol/L — AB (ref 8–30)

## 2014-04-11 ENCOUNTER — Encounter: Payer: Self-pay | Admitting: *Deleted

## 2014-04-11 ENCOUNTER — Ambulatory Visit (INDEPENDENT_AMBULATORY_CARE_PROVIDER_SITE_OTHER): Payer: Self-pay | Admitting: Neurology

## 2014-04-11 ENCOUNTER — Encounter: Payer: Self-pay | Admitting: Neurology

## 2014-04-11 VITALS — BP 154/88 | HR 95 | Ht 63.0 in | Wt 204.2 lb

## 2014-04-11 DIAGNOSIS — G603 Idiopathic progressive neuropathy: Secondary | ICD-10-CM | POA: Insufficient documentation

## 2014-04-11 DIAGNOSIS — G610 Guillain-Barre syndrome: Secondary | ICD-10-CM

## 2014-04-11 DIAGNOSIS — G549 Nerve root and plexus disorder, unspecified: Secondary | ICD-10-CM

## 2014-04-11 DIAGNOSIS — E1140 Type 2 diabetes mellitus with diabetic neuropathy, unspecified: Secondary | ICD-10-CM

## 2014-04-11 DIAGNOSIS — G629 Polyneuropathy, unspecified: Secondary | ICD-10-CM

## 2014-04-11 MED ORDER — GABAPENTIN 300 MG PO CAPS: ORAL_CAPSULE | ORAL | Status: DC

## 2014-04-11 NOTE — Patient Instructions (Addendum)
1.  Start gabapentin as follows: take 1 tablet bedtime for 1 week, then increase to 1 tablet twice daily. 2.  Continue notriptyline 25mg  at bedtime 3.  Establish care with PCP for diabetes management 4.  Return to clinic in 153-months

## 2014-04-11 NOTE — Progress Notes (Signed)
Follow-up Visit   Date: 04/11/2014    DIANARA SMULLEN MRN: [id number] DOB: 06-26-1950   Interim History: KATELAND LEISINGER is a 64 y.o. right-handed Caucasian female with history of depression returning to the clinic for follow-up of sensory neuropathy.  The patient was accompanied to the clinic by her husband who also provides collateral information.    History of present illness: She reports suffering from depression during early 2015. Her husband suffered a heart attack in December and then kidney stone in March 2015. She also lost two of her dogs very close to each other. She went to see her PCP because of problems with low energy, depression, and abdominal pain. She was started on Celexa, Wellbutrin, Atarax, Ciprofloxacin, and Prilosec. She took it for a week and reported that it was not working. She was switched to another medication. Prior to taking these medications, she was very active and healthy with no medical problems. Overnight, she developed generalized shaking of her body and weakness. Her husband and friend carried her to the car and took her to the emergency department at which time all her medications were stopped. She was readmitted one week later to [facility] and was diagnosed with UTI and anxiety on 6/25. Labs including CBC normal, Na, K, Cl low and treated, LFTs, CK, ammonia normal. CT head negative. She returned on 7/1 per records labs normal, MRI brain 6/29 negative. She had PT for 2 days in [facility] and was discharged home with PT exercises. Again, she was admitted on 10/04/13 due to worsening mobility, now wheelchair-bound, and difficulty taking care of her.   She has numbness/tingling of the hands only and says that sensation of her fingers are like "brillo pads". Denies any abnormal sensation of the feet. She had difficulty with fine motor movement and is dropping things. She had one fall last week and was able to stand up herself.   She  is not up-to-date with her cancer screening. She reports drinking 3 glasses of wine per day from December until April.  She saw Dr. Delice Lesch for these complaints in early July. EMG performed by my self was concerning for sensory polyradiculoneuropathy so she underwent CSF testing which did not show any signs of inflammation or elevated protein. Patient referred to be for further evaluation.  UPDATE 02/05/2014:  Since her last visit, she reports making slightly positive improvement, such that she is now able to bathe and dress herself.  She is able to walk with a walker much better and is less dependent on her husband.  She cannot walk unassisted due to imbalance, but does not complain of weakness.  No interval falls.  Her paresthesias of her hands have also improved slightly, but numbness of the legs remains unchanged.  Her husband is still working on Dietitian clearance from [place], but reports should be approved in the next 1-2 weeks.  For this reason, all her blood work which was ordered was not completed.     UPDATE 04/11/2013:  She has made significant improvements since her last visit and even walked unassisted to her appointment today!  She is using a walker only as needed at home.  Balance is much better, no interval falls.  She continues to have burning tingling and pain of the feet which is most bothersome.  Her husband is applying icyhot which helps somewhat.  She is helping out with chores around the home such as making the bed and laundry.  Her blood work  showed abnormal glucose tolerance test and we recommended that she f/u with a PCP but has not been able to do this yet.  Mood is also much better, husband says she smiles so much more now.     Medications:  Nortriptyline $RemoveBefore'25mg'[id number]$  qhs  Allergies: No Known Allergies  Review of Systems:  CONSTITUTIONAL: No fevers, chills, night sweats, or weight loss.   EYES: No visual changes or eye pain ENT: No hearing changes.  No history of nose  bleeds.   RESPIRATORY: No cough, wheezing and shortness of breath.   CARDIOVASCULAR: Negative for chest pain, and palpitations.   GI: Negative for abdominal discomfort, blood in stools or black stools.  No recent change in bowel habits.   GU:  No history of incontinence.   MUSCLOSKELETAL: No history of joint pain or swelling.  No myalgias.   SKIN: Negative for lesions, rash, and itching.   ENDOCRINE: Negative for cold or heat intolerance, polydipsia or goiter.   PSYCH:  No depression or anxiety symptoms.   NEURO: As Above.   Vital Signs:  BP 154/88 mmHg  Pulse 95  Ht $R'5\' 3"'HU$  (1.6 m)  Wt 204 lb 3 oz (92.619 kg)  BMI 36.18 kg/m2  SpO2 99%  Neurological Exam: MENTAL STATUS including orientation to time, place, person, recent and remote memory, attention span and concentration, language, and fund of knowledge is normal.  Speech is not dysarthric.  CRANIAL NERVES: No visual field defects. Pupils equal round and reactive to light.  Normal conjugate, extra-ocular eye movements in all directions of gaze.  No ptosis. Normal facial sensation.  Face is symmetric. Palate elevates symmetrically.  Tongue is midline.  MOTOR:  No atrophy, fasciculations or abnormal movements.  No pronator drift.  Tone is normal.    Right Upper Extremity:    Left Upper Extremity:    Deltoid  5/5   Deltoid  5/5   Biceps  5/5   Biceps  5/5   Triceps  5/5   Triceps  5/5   Wrist extensors  5/5   Wrist extensors  5/5   Wrist flexors  5/5   Wrist flexors  5/5   Finger extensors  5/5   Finger extensors  5/5   Finger flexors  5/5   Finger flexors  5/5   Dorsal interossei  5/5   Dorsal interossei  5/5   Abductor pollicis  5/5   Abductor pollicis  5/5   Tone (Ashworth scale)  0  Tone (Ashworth scale)  0   Right Lower Extremity:    Left Lower Extremity:    Hip flexors  5/5   Hip flexors  5/5 (improved)  Hip extensors  5/5   Hip extensors  5/5    Knee flexors  5/5   Knee flexors  5/5   Knee extensors  5/5   Knee  extensors  5/5   Dorsiflexors  5/5   Dorsiflexors  5/5   Plantarflexors  5/5   Plantarflexors  5/5   Toe extensors  5/5   Toe extensors  5/5   Toe flexors  5/5   Toe flexors  5/5   Tone (Ashworth scale)  0  Tone (Ashworth scale)  0    MSRs:  Right  Left brachioradialis 2+  brachioradialis 2+  biceps 2+  biceps 2+  triceps 2+  triceps 2+  patellar 0  Patellar 0  ankle jerk 0  ankle jerk 0  Hoffman no  Hoffman no  plantar response down  plantar response down   SENSORY:  Vibration intact at MCP, right knee 70%, left knee 70%, and 40-50% at ankles bilaterally.  Pin prick intact in the upper extremities and remains absent in the distal legs.  Proprioception is impaired at great toe bilaterally, intact at the ankles.  There is mild sway with Rhomberg testing.  GAIT: She is able to walk unassisted and rise from seated position without using arms!!  Gait appears slightly wide-based, but stable.  She is able to perform stressed gait, mild unsteadiness with tandem gait.  Data: CSF 12/09/2013: W1 R50 G58 P30, Lyme negative, ACE 11, IgG inex 2.6, OCB none, cytology neg  EMG 11/20/2913: The electrophysiologic findings are most consistent with a patchy and subacute polyradiculoneuropathy, predominantly affecting sensory nerves greater than motor nerves. With respect to sensory abnormalities, these findings are severe in degree electrically.  Incidentally, there is evidence of a right Riche-Cannieu anastomosis, a normal variant.   MRI lumbar spine wwo contrast 12/09/2013: 1. Moderate facet disease at L4-5 with bilateral facet joint effusions and synovial enhancement. There is an extraforaminal annular fissure on the left without resulting L4 nerve root encroachment.  2. Otherwise mild lumbar spondylosis with disc bulging and facet hypertrophy from L1-2 through L3-4. No significant spinal stenosis or nerve root encroachment.   MRI cervical  spine wwo contrast 12/09/2013: Mild multilevel cervical spondylosis   Labs 01/07/2014:  SSA/B neg, ESR 40*, TSH 3.9, vitamin B12 430, copper 127, 2hr glucose tolerance test abnormal 102/229, 217*, heavy metal screen negative, celiac panel negative, SPEP/UPEP no M protein, ANA neg, RPR neg, vitamin B1 43, vitamin B6 27   IMPRESSION: Mrs. 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 Around the same time, she was being treated for depression and UTI and her symptoms started very abruptly after taking these medications (Celexa, Wellbutrin, Atarax, Ciprofloxacin, and Prilosec), but she does not recall specifically if there was any particular one which may have triggered symptoms.  Her neurological exam today shows markedly improved gait.  She continues to have sensory deficits, but she is now able to perceive vibration at the ankle which was previously absent. Functionally, she is also doing much better and less dependent on her husband.  Mood is also doing great.  Her laboratory testing has largely been negative, except that her glucose tolerance test is suggestive of diabetes. Workup looking for Sjogren's disease, inflammatory/autoimmune conditions is negative. Thyroid, vitamin B and copper levels has been normal.  Her CSF studies do not show elevated protein or inflammatory changes, making AIDP/GBS less likely.  Clinically, she is doing great and making progress independent of any intervention.  With her new findings of diabetes, I have encouraged her to establish care with PCP for management, as this is likely a contributing factor to her neuropathy.  Other possibilities remain alcohol-induced neuropathy versus medication effects.   PLAN/RECOMMENDATIONS:  1. Start gabapentin 300mg  twice daily 2. Increase notriptyline 25mg  at bedtime 3.  Encouraged to establish care with  PCP for diabetes management 4.  Return to clinic in 45-months   The duration of this appointment visit was 30 minutes of face-to-face time with the patient.  Greater than 50% of this time was spent in counseling, explanation of diagnosis, planning of further management, and coordination of care.   Thank you for allowing me to participate in patient's care.  If I can answer any additional questions, I would be pleased to do so.    Sincerely,    Donika K. Posey Pronto, DO

## 2014-04-15 ENCOUNTER — Telehealth: Payer: Self-pay | Admitting: Neurology

## 2014-04-15 NOTE — Telephone Encounter (Signed)
Pt called/returning your call at 8:04AM. Pt stated that she is having a hard time with her phone. If you can not get in touch please leave her a message or she will call back. C/b [phone]

## 2014-04-15 NOTE — Telephone Encounter (Signed)
Left message for patient that her appointment is on February 29 at 2:00 with Marcos EkeGreg Calone, NP in internal medicine.  [address]  The payment for that day will be $80.

## 2014-05-26 ENCOUNTER — Encounter: Payer: Self-pay | Admitting: Family

## 2014-05-26 ENCOUNTER — Other Ambulatory Visit (INDEPENDENT_AMBULATORY_CARE_PROVIDER_SITE_OTHER): Payer: Self-pay

## 2014-05-26 ENCOUNTER — Ambulatory Visit (INDEPENDENT_AMBULATORY_CARE_PROVIDER_SITE_OTHER): Payer: Self-pay | Admitting: Family

## 2014-05-26 ENCOUNTER — Telehealth: Payer: Self-pay | Admitting: Family

## 2014-05-26 VITALS — BP 134/70 | HR 109 | Temp 98.1°F | Resp 18

## 2014-05-26 DIAGNOSIS — E1165 Type 2 diabetes mellitus with hyperglycemia: Secondary | ICD-10-CM

## 2014-05-26 DIAGNOSIS — G629 Polyneuropathy, unspecified: Secondary | ICD-10-CM

## 2014-05-26 DIAGNOSIS — IMO0002 Reserved for concepts with insufficient information to code with codable children: Secondary | ICD-10-CM | POA: Insufficient documentation

## 2014-05-26 DIAGNOSIS — E1142 Type 2 diabetes mellitus with diabetic polyneuropathy: Secondary | ICD-10-CM | POA: Insufficient documentation

## 2014-05-26 DIAGNOSIS — E119 Type 2 diabetes mellitus without complications: Secondary | ICD-10-CM | POA: Insufficient documentation

## 2014-05-26 LAB — HEMOGLOBIN A1C: Hgb A1c MFr Bld: 5.9 % (ref 4.6–6.5)

## 2014-05-26 NOTE — Progress Notes (Signed)
Pre visit review using our clinic review tool, if applicable. No additional management support is needed unless otherwise documented below in the visit note. 

## 2014-05-26 NOTE — Progress Notes (Signed)
Subjective:    Patient ID: Hailey Greene, female    DOB: Mar 18, 1951, 64 y.o.   MRN: [id number]  Chief Complaint  Patient presents with  . Establish Care    has diabetes and not on any medication, would like to see what type she has and what to do    HPI:  Hailey Greene is a 64 y.o. female who presents today to establish care and discuss her diabetes.   1) Diabetes - Was recently diagnosed in December of 2015 through an OGTT. Denies any associated symptoms polydipisia, polyphagia, or polyuria. Not currently prescribed any medication.  2) Polyradiculoneuropathy - This has been going for just under 1 year. Experiencing the associated symptoms of neuropathic pain in her legs, hands and feet. Pain is described as shooting/stabbing/heaviness and the intensity of the pain is rated a 10/10. Currently treated with gabapentin and nortriptyline and managed by Dr. Allena Katz of [facility].    No Known Allergies   Current Outpatient Prescriptions on File Prior to Visit  Medication Sig Dispense Refill  . gabapentin (NEURONTIN) 300 MG capsule Take 1 tablet bedtime for 1 week, then increase to 1 tablet twice daily. 60 capsule 5  . nortriptyline (PAMELOR) 25 MG capsule Take 1 capsule (25 mg total) by mouth at bedtime. 30 capsule 5   No current facility-administered medications on file prior to visit.    Past Medical History  Diagnosis Date  . Diabetes   . Neuropathy   . Depression   . Weakness   . Tachycardia   . Chicken pox   . Hypertension   . UTI (lower urinary tract infection)     Past Surgical History  Procedure Laterality Date  . Appendectomy    . Abdominal hysterectomy    . Tonsillectomy      Family History  Problem Relation Age of Onset  . Brain cancer Father   . Heart disease Maternal Grandmother     History   Social History  . Marital Status: Married    Spouse Name: N/A  . Number of Children: 3  . Years of Education: 12   Occupational History  .  Housewife    Social History Main Topics  . Smoking status: Former Smoker -- 2.00 packs/day for 12 years    Types: Cigarettes    Quit date: 05/25/2010  . Smokeless tobacco: Never Used  . Alcohol Use: No  . Drug Use: No  . Sexual Activity: Not on file   Other Topics Concern  . Not on file   Social History Narrative   Born and raised in [place], [place]. Currently lives in a house with her husband. 1 dog. Fun: Garden, feed birds, swimming   Denies any religious beliefs effecting health care.     Review of Systems  Eyes:       Denies changes in vision  Endocrine: Negative for polydipsia, polyphagia and polyuria.  Neurological: Positive for numbness.      Objective:    BP 134/70 mmHg  Pulse 109  Temp(Src) 98.1 F (36.7 C) (Oral)  Resp 18  SpO2 92% Nursing note and vital signs reviewed.  Physical Exam  Constitutional: She is oriented to person, place, and time. She appears well-developed and well-nourished. No distress.  Cardiovascular: Normal rate, regular rhythm, normal heart sounds and intact distal pulses.   Pulmonary/Chest: Effort normal and breath sounds normal.  Neurological: She is alert and oriented to person, place, and time.  Decreased sensation noted in all 4 extremities.  Patient is unable to determine sharp/dull or vibration.  Skin: Skin is warm and dry.  Psychiatric: She has a normal mood and affect. Her behavior is normal. Judgment and thought content normal.       Assessment & Plan:

## 2014-05-26 NOTE — Assessment & Plan Note (Signed)
Newly diagnosed type 2 diabetes. Obtain A1c. Continue lifestyle and dietary management at this time pending results. Discussed with patient importance of prevention for end organ damage. Patient has limited financial resources. If A1c is greater than 7 start metformin. Follow-up in 3 months or sooner pending blood work.

## 2014-05-26 NOTE — Telephone Encounter (Signed)
Please inform the patient that her blood work shows her A1c is 5.9. Therefore there are no changes that are needed for now and no medication is needed at this time. Therefore we can continue to monitor and recheck her A1c in about 6 months.

## 2014-05-26 NOTE — Assessment & Plan Note (Signed)
>>  ASSESSMENT AND PLAN FOR T2DM (TYPE 2 DIABETES MELLITUS) (HCC) WRITTEN ON 05/26/2014  3:37 PM BY CALONE, GREGORY, FNP  Newly diagnosed type 2 diabetes. Obtain A1c. Continue lifestyle and dietary management at this time pending results. Discussed with patient importance of prevention for end organ damage. Patient has limited financial resources. If A1c is greater than 7 start metformin. Follow-up in 3 months or sooner pending blood work.

## 2014-05-26 NOTE — Assessment & Plan Note (Signed)
Stable and slightly improving per patient. Currently treated with gabapentin and nortriptyline which is managed by Dr. Allena KatzPatel. Follow-up as needed.

## 2014-05-26 NOTE — Patient Instructions (Signed)
Thank you for choosing [facility].  Summary/Instructions:  Please stop by the lab on the basement level of the building for your blood work. Your results will be released to MyChart (or called to you) after review, usually within 72 hours after test completion. If any changes need to be made, you will be notified at that same time.  If your symptoms worsen or fail to improve, please contact our office for further instruction, or in case of emergency go directly to the emergency room at the closest medical facility.    Type 2 Diabetes Mellitus Type 2 diabetes mellitus, often simply referred to as type 2 diabetes, is a long-lasting (chronic) disease. In type 2 diabetes, the pancreas does not make enough insulin (a hormone), the cells are less responsive to the insulin that is made (insulin resistance), or both. Normally, insulin moves sugars from food into the tissue cells. The tissue cells use the sugars for energy. The lack of insulin or the lack of normal response to insulin causes excess sugars to build up in the blood instead of going into the tissue cells. As a result, high blood sugar (hyperglycemia) develops. The effect of high sugar (glucose) levels can cause many complications. Type 2 diabetes was also previously called adult-onset diabetes, but it can occur at any age.  RISK FACTORS  A person is predisposed to developing type 2 diabetes if someone in the family has the disease and also has one or more of the following primary risk factors:  Overweight.  An inactive lifestyle.  A history of consistently eating high-calorie foods. Maintaining a normal weight and regular physical activity can reduce the chance of developing type 2 diabetes. SYMPTOMS  A person with type 2 diabetes may not show symptoms initially. The symptoms of type 2 diabetes appear slowly. The symptoms include:  Increased thirst (polydipsia).  Increased urination (polyuria).  Increased urination during  the night (nocturia).  Weight loss. This weight loss may be rapid.  Frequent, recurring infections.  Tiredness (fatigue).  Weakness.  Vision changes, such as blurred vision.  Fruity smell to your breath.  Abdominal pain.  Nausea or vomiting.  Cuts or bruises which are slow to heal.  Tingling or numbness in the hands or feet. DIAGNOSIS Type 2 diabetes is frequently not diagnosed until complications of diabetes are present. Type 2 diabetes is diagnosed when symptoms or complications are present and when blood glucose levels are increased. Your blood glucose level may be checked by one or more of the following blood tests:  A fasting blood glucose test. You will not be allowed to eat for at least 8 hours before a blood sample is taken.  A random blood glucose test. Your blood glucose is checked at any time of the day regardless of when you ate.  A hemoglobin A1c blood glucose test. A hemoglobin A1c test provides information about blood glucose control over the previous 3 months.  An oral glucose tolerance test (OGTT). Your blood glucose is measured after you have not eaten (fasted) for 2 hours and then after you drink a glucose-containing beverage. TREATMENT   You may need to take insulin or diabetes medicine daily to keep blood glucose levels in the desired range.  If you use insulin, you may need to adjust the dosage depending on the carbohydrates that you eat with each meal or snack. The treatment goal is to maintain the before meal blood sugar (preprandial glucose) level at 70-130 mg/dL. HOME CARE INSTRUCTIONS   Have your  hemoglobin A1c level checked twice a year.  Perform daily blood glucose monitoring as directed by your health care provider.  Monitor urine ketones when you are ill and as directed by your health care provider.  Take your diabetes medicine or insulin as directed by your health care provider to maintain your blood glucose levels in the desired  range.  Never run out of diabetes medicine or insulin. It is needed every day.  If you are using insulin, you may need to adjust the amount of insulin given based on your intake of carbohydrates. Carbohydrates can raise blood glucose levels but need to be included in your diet. Carbohydrates provide vitamins, minerals, and fiber which are an essential part of a healthy diet. Carbohydrates are found in fruits, vegetables, whole grains, dairy products, legumes, and foods containing added sugars.  Eat healthy foods. You should make an appointment to see a registered dietitian to help you create an eating plan that is right for you.  Lose weight if you are overweight.  Carry a medical alert card or wear your medical alert jewelry.  Carry a 15-gram carbohydrate snack with you at all times to treat low blood glucose (hypoglycemia). Some examples of 15-gram carbohydrate snacks include:  Glucose tablets, 3 or 4.  Glucose gel, 15-gram tube.  Raisins, 2 tablespoons (24 grams).  Jelly beans, 6.  Animal crackers, 8.  Regular pop, 4 ounces (120 mL).  Gummy treats, 9.  Recognize hypoglycemia. Hypoglycemia occurs with blood glucose levels of 70 mg/dL and below. The risk for hypoglycemia increases when fasting or skipping meals, during or after intense exercise, and during sleep. Hypoglycemia symptoms can include:  Tremors or shakes.  Decreased ability to concentrate.  Sweating.  Increased heart rate.  Headache.  Dry mouth.  Hunger.  Irritability.  Anxiety.  Restless sleep.  Altered speech or coordination.  Confusion.  Treat hypoglycemia promptly. If you are alert and able to safely swallow, follow the 15:15 rule:  Take 15-20 grams of rapid-acting glucose or carbohydrate. Rapid-acting options include glucose gel, glucose tablets, or 4 ounces (120 mL) of fruit juice, regular soda, or low-fat milk.  Check your blood glucose level 15 minutes after taking the glucose.  Take  15-20 grams more of glucose if the repeat blood glucose level is still 70 mg/dL or below.  Eat a meal or snack within 1 hour once blood glucose levels return to normal.  Be alert to feeling very thirsty and urinating more frequently than usual, which are early signs of hyperglycemia. An early awareness of hyperglycemia allows for prompt treatment. Treat hyperglycemia as directed by your health care provider.  Engage in at least 150 minutes of moderate-intensity physical activity a week, spread over at least 3 days of the week or as directed by your health care provider. In addition, you should engage in resistance exercise at least 2 times a week or as directed by your health care provider. Try to spend no more than 90 minutes at one time inactive.  Adjust your medicine and food intake as needed if you start a new exercise or sport.  Follow your sick-day plan anytime you are unable to eat or drink as usual.  Do not use any tobacco products including cigarettes, chewing tobacco, or electronic cigarettes. If you need help quitting, ask your health care provider.  Limit alcohol intake to no more than 1 drink per day for nonpregnant women and 2 drinks per day for men. You should drink alcohol only when you are  also eating food. Talk with your health care provider whether alcohol is safe for you. Tell your health care provider if you drink alcohol several times a week.  Keep all follow-up visits as directed by your health care provider. This is important.  Schedule an eye exam soon after the diagnosis of type 2 diabetes and then annually.  Perform daily skin and foot care. Examine your skin and feet daily for cuts, bruises, redness, nail problems, bleeding, blisters, or sores. A foot exam by a health care provider should be done annually.  Brush your teeth and gums at least twice a day and floss at least once a day. Follow up with your dentist regularly.  Share your diabetes management plan with  your workplace or school.  Stay up-to-date with immunizations. It is recommended that people with diabetes who are over 63 years old get the pneumonia vaccine. In some cases, two separate shots may be given. Ask your health care provider if your pneumonia vaccination is up-to-date.  Learn to manage stress.  Obtain ongoing diabetes education and support as needed.  Participate in or seek rehabilitation as needed to maintain or improve independence and quality of life. Request a physical or occupational therapy referral if you are having foot or hand numbness, or difficulties with grooming, dressing, eating, or physical activity. SEEK MEDICAL CARE IF:   You are unable to eat food or drink fluids for more than 6 hours.  You have nausea and vomiting for more than 6 hours.  Your blood glucose level is over 240 mg/dL.  There is a change in mental status.  You develop an additional serious illness.  You have diarrhea for more than 6 hours.  You have been sick or have had a fever for a couple of days and are not getting better.  You have pain during any physical activity.  SEEK IMMEDIATE MEDICAL CARE IF:  You have difficulty breathing.  You have moderate to large ketone levels. MAKE SURE YOU:  Understand these instructions.  Will watch your condition.  Will get help right away if you are not doing well or get worse. Document Released: 03/14/2005 Document Revised: 07/29/2013 Document Reviewed: 10/11/2011 [facility] Patient Information 2015 [employer], [place]. This information is not intended to replace advice given to you by your health care provider. Make sure you discuss any questions you have with your health care provider.

## 2014-05-27 NOTE — Telephone Encounter (Signed)
LVM for pt to call back.

## 2014-05-27 NOTE — Telephone Encounter (Signed)
Pt aware of results 

## 2014-07-15 ENCOUNTER — Ambulatory Visit: Payer: Self-pay | Admitting: Neurology

## 2014-07-15 DIAGNOSIS — Z029 Encounter for administrative examinations, unspecified: Secondary | ICD-10-CM

## 2014-07-17 ENCOUNTER — Encounter: Payer: Self-pay | Admitting: Neurology

## 2016-11-03 DIAGNOSIS — K432 Incisional hernia without obstruction or gangrene: Secondary | ICD-10-CM | POA: Insufficient documentation

## 2018-02-28 DIAGNOSIS — Z01818 Encounter for other preprocedural examination: Secondary | ICD-10-CM

## 2018-06-01 DIAGNOSIS — Z01818 Encounter for other preprocedural examination: Secondary | ICD-10-CM

## 2018-12-10 DIAGNOSIS — Z01818 Encounter for other preprocedural examination: Secondary | ICD-10-CM

## 2018-12-11 DIAGNOSIS — G8929 Other chronic pain: Secondary | ICD-10-CM | POA: Insufficient documentation

## 2018-12-11 DIAGNOSIS — M25562 Pain in left knee: Secondary | ICD-10-CM | POA: Insufficient documentation

## 2018-12-31 ENCOUNTER — Encounter (HOSPITAL_COMMUNITY): Payer: Self-pay | Admitting: Family Medicine

## 2018-12-31 ENCOUNTER — Emergency Department (HOSPITAL_COMMUNITY): Payer: 59

## 2018-12-31 ENCOUNTER — Inpatient Hospital Stay (HOSPITAL_COMMUNITY)
Admission: EM | Admit: 2018-12-31 | Discharge: 2019-01-04 | DRG: 177 | Disposition: A | Discharge status: Home Health | Payer: 59 | Attending: Internal Medicine | Admitting: Internal Medicine

## 2018-12-31 ENCOUNTER — Other Ambulatory Visit: Payer: Self-pay

## 2018-12-31 DIAGNOSIS — R627 Adult failure to thrive: Secondary | ICD-10-CM | POA: Diagnosis present

## 2018-12-31 DIAGNOSIS — I10 Essential (primary) hypertension: Secondary | ICD-10-CM

## 2018-12-31 DIAGNOSIS — E1159 Type 2 diabetes mellitus with other circulatory complications: Secondary | ICD-10-CM

## 2018-12-31 DIAGNOSIS — E876 Hypokalemia: Secondary | ICD-10-CM | POA: Diagnosis present

## 2018-12-31 DIAGNOSIS — E669 Obesity, unspecified: Secondary | ICD-10-CM | POA: Diagnosis present

## 2018-12-31 DIAGNOSIS — Z87891 Personal history of nicotine dependence: Secondary | ICD-10-CM | POA: Diagnosis not present

## 2018-12-31 DIAGNOSIS — R197 Diarrhea, unspecified: Secondary | ICD-10-CM | POA: Diagnosis present

## 2018-12-31 DIAGNOSIS — E1142 Type 2 diabetes mellitus with diabetic polyneuropathy: Secondary | ICD-10-CM | POA: Diagnosis present

## 2018-12-31 DIAGNOSIS — U071 COVID-19: Secondary | ICD-10-CM | POA: Diagnosis present

## 2018-12-31 DIAGNOSIS — I152 Hypertension secondary to endocrine disorders: Secondary | ICD-10-CM

## 2018-12-31 DIAGNOSIS — E1169 Type 2 diabetes mellitus with other specified complication: Secondary | ICD-10-CM | POA: Diagnosis not present

## 2018-12-31 DIAGNOSIS — Z8249 Family history of ischemic heart disease and other diseases of the circulatory system: Secondary | ICD-10-CM | POA: Diagnosis not present

## 2018-12-31 DIAGNOSIS — R9431 Abnormal electrocardiogram [ECG] [EKG]: Secondary | ICD-10-CM | POA: Diagnosis present

## 2018-12-31 DIAGNOSIS — J9601 Acute respiratory failure with hypoxia: Secondary | ICD-10-CM | POA: Diagnosis present

## 2018-12-31 DIAGNOSIS — Z96659 Presence of unspecified artificial knee joint: Secondary | ICD-10-CM | POA: Diagnosis present

## 2018-12-31 DIAGNOSIS — Z6841 Body Mass Index (BMI) 40.0 and over, adult: Secondary | ICD-10-CM | POA: Diagnosis not present

## 2018-12-31 DIAGNOSIS — Z79899 Other long term (current) drug therapy: Secondary | ICD-10-CM | POA: Diagnosis not present

## 2018-12-31 DIAGNOSIS — J1289 Other viral pneumonia: Secondary | ICD-10-CM | POA: Diagnosis present

## 2018-12-31 DIAGNOSIS — G603 Idiopathic progressive neuropathy: Secondary | ICD-10-CM

## 2018-12-31 DIAGNOSIS — K648 Other hemorrhoids: Secondary | ICD-10-CM | POA: Diagnosis present

## 2018-12-31 DIAGNOSIS — F329 Major depressive disorder, single episode, unspecified: Secondary | ICD-10-CM | POA: Diagnosis present

## 2018-12-31 DIAGNOSIS — Z808 Family history of malignant neoplasm of other organs or systems: Secondary | ICD-10-CM

## 2018-12-31 DIAGNOSIS — E119 Type 2 diabetes mellitus without complications: Secondary | ICD-10-CM | POA: Diagnosis present

## 2018-12-31 DIAGNOSIS — IMO0002 Reserved for concepts with insufficient information to code with codable children: Secondary | ICD-10-CM | POA: Diagnosis present

## 2018-12-31 DIAGNOSIS — G629 Polyneuropathy, unspecified: Secondary | ICD-10-CM

## 2018-12-31 DIAGNOSIS — E1165 Type 2 diabetes mellitus with hyperglycemia: Secondary | ICD-10-CM | POA: Diagnosis present

## 2018-12-31 HISTORY — DX: COVID-19: U07.1

## 2018-12-31 LAB — CBC WITH DIFFERENTIAL/PLATELET
Abs Immature Granulocytes: 0.03 10*3/uL (ref 0.00–0.07)
Basophils Absolute: 0 10*3/uL (ref 0.0–0.1)
Basophils Relative: 0 %
Eosinophils Absolute: 0 10*3/uL (ref 0.0–0.5)
Eosinophils Relative: 0 %
HCT: 43.6 % (ref 36.0–46.0)
Hemoglobin: 14.6 g/dL (ref 12.0–15.0)
Immature Granulocytes: 1 %
Lymphocytes Relative: 10 %
Lymphs Abs: 0.5 10*3/uL — ABNORMAL LOW (ref 0.7–4.0)
MCH: 28.2 pg (ref 26.0–34.0)
MCHC: 33.5 g/dL (ref 30.0–36.0)
MCV: 84.3 fL (ref 80.0–100.0)
Monocytes Absolute: 0.5 10*3/uL (ref 0.1–1.0)
Monocytes Relative: 9 %
Neutro Abs: 4.4 10*3/uL (ref 1.7–7.7)
Neutrophils Relative %: 80 %
Platelets: 231 10*3/uL (ref 150–400)
RBC: 5.17 MIL/uL — ABNORMAL HIGH (ref 3.87–5.11)
RDW: 14.9 % (ref 11.5–15.5)
WBC: 5.4 10*3/uL (ref 4.0–10.5)
nRBC: 0 % (ref 0.0–0.2)

## 2018-12-31 LAB — COMPREHENSIVE METABOLIC PANEL
ALT: 26 U/L (ref 0–44)
AST: 22 U/L (ref 15–41)
Albumin: 4.1 g/dL (ref 3.5–5.0)
Alkaline Phosphatase: 89 U/L (ref 38–126)
Anion gap: 12 (ref 5–15)
BUN: 18 mg/dL (ref 8–23)
CO2: 24 mmol/L (ref 22–32)
Calcium: 8.9 mg/dL (ref 8.9–10.3)
Chloride: 98 mmol/L (ref 98–111)
Creatinine, Ser: 0.78 mg/dL (ref 0.44–1.00)
GFR calc Af Amer: >60 mL/min (ref >60)
GFR calc non Af Amer: >60 mL/min (ref >60)
Glucose, Bld: 155 mg/dL — ABNORMAL HIGH (ref 70–99)
Potassium: 3.2 mmol/L — ABNORMAL LOW (ref 3.5–5.1)
Sodium: 134 mmol/L — ABNORMAL LOW (ref 135–145)
Total Bilirubin: 0.8 mg/dL (ref 0.3–1.2)
Total Protein: 7.4 g/dL (ref 6.5–8.1)

## 2018-12-31 LAB — LACTIC ACID, PLASMA
Lactic Acid, Venous: 1.2 mmol/L (ref 0.5–1.9)
Lactic Acid, Venous: 0.8 mmol/L (ref 0.5–1.9)

## 2018-12-31 LAB — FERRITIN: Ferritin: 421 ng/mL — ABNORMAL HIGH (ref 11–307)

## 2018-12-31 LAB — POC OCCULT BLOOD, ED: Fecal Occult Bld: POSITIVE — AB

## 2018-12-31 LAB — BRAIN NATRIURETIC PEPTIDE: B Natriuretic Peptide: 51.3 pg/mL (ref 0.0–100.0)

## 2018-12-31 LAB — TYPE AND SCREEN
ABO/RH(D): A POS
Antibody Screen: NEGATIVE

## 2018-12-31 LAB — LIPASE, BLOOD: Lipase: 27 U/L (ref 11–51)

## 2018-12-31 LAB — C-REACTIVE PROTEIN: CRP: 0.8 mg/dL (ref <1.0)

## 2018-12-31 LAB — TROPONIN I (HIGH SENSITIVITY)
Troponin I (High Sensitivity): 4 ng/L (ref <18)
Troponin I (High Sensitivity): 5 ng/L (ref <18)

## 2018-12-31 LAB — D-DIMER, QUANTITATIVE: D-Dimer, Quant: 0.43 ug/mL-FEU (ref 0.00–0.50)

## 2018-12-31 LAB — GLUCOSE, CAPILLARY: Glucose-Capillary: 121 mg/dL — ABNORMAL HIGH (ref 70–99)

## 2018-12-31 LAB — SARS CORONAVIRUS 2 BY RT PCR (HOSPITAL ORDER, PERFORMED IN ~~LOC~~ HOSPITAL LAB): SARS Coronavirus 2: POSITIVE — AB

## 2018-12-31 LAB — PROCALCITONIN: Procalcitonin: <0.1 ng/mL

## 2018-12-31 LAB — LACTATE DEHYDROGENASE: LDH: 152 U/L (ref 98–192)

## 2018-12-31 MED ORDER — METHYLPREDNISOLONE SODIUM SUCC 125 MG IJ SOLR
60.0000 mg | Freq: Once | INTRAMUSCULAR | Status: AC
  Administered 2018-12-31: 60 mg via INTRAVENOUS
  Filled 2018-12-31: qty 2

## 2018-12-31 MED ORDER — ENOXAPARIN SODIUM 60 MG/0.6ML ~~LOC~~ SOLN
50.0000 mg | SUBCUTANEOUS | Status: DC
  Administered 2018-12-31 – 2019-01-03 (×4): 50 mg via SUBCUTANEOUS
  Filled 2018-12-31 (×5): qty 0.6

## 2018-12-31 MED ORDER — DEXAMETHASONE SODIUM PHOSPHATE 10 MG/ML IJ SOLN: 6.0000 mg | INTRAMUSCULAR | Status: DC

## 2018-12-31 MED ORDER — DIPHENHYDRAMINE HCL 25 MG PO CAPS
25.0000 mg | ORAL_CAPSULE | Freq: Once | ORAL | Status: AC
  Administered 2018-12-31: 21:00:00 25 mg via ORAL
  Filled 2018-12-31: qty 1

## 2018-12-31 MED ORDER — NORTRIPTYLINE HCL 25 MG PO CAPS: 25.0000 mg | ORAL_CAPSULE | Freq: Every day | ORAL | Status: DC

## 2018-12-31 MED ORDER — ACETAMINOPHEN 325 MG PO TABS
650.0000 mg | ORAL_TABLET | Freq: Four times a day (QID) | ORAL | Status: DC | PRN
  Administered 2019-01-03: 650 mg via ORAL
  Filled 2018-12-31: qty 2

## 2018-12-31 MED ORDER — ZINC SULFATE 220 (50 ZN) MG PO CAPS
220.0000 mg | ORAL_CAPSULE | Freq: Every day | ORAL | Status: DC
  Administered 2018-12-31 – 2019-01-04 (×5): 220 mg via ORAL
  Filled 2018-12-31 (×5): qty 1

## 2018-12-31 MED ORDER — POTASSIUM CHLORIDE 2 MEQ/ML IV SOLN
INTRAVENOUS | Status: AC
  Administered 2018-12-31: 20:00:00 via INTRAVENOUS
  Filled 2018-12-31: qty 1000

## 2018-12-31 MED ORDER — HYDROCOD POLST-CPM POLST ER 10-8 MG/5ML PO SUER: 5.0000 mL | Freq: Two times a day (BID) | ORAL | Status: DC | PRN

## 2018-12-31 MED ORDER — INSULIN ASPART 100 UNIT/ML ~~LOC~~ SOLN
0.0000 [IU] | Freq: Three times a day (TID) | SUBCUTANEOUS | Status: DC
  Administered 2018-12-31: 18:00:00 1 [IU] via SUBCUTANEOUS
  Administered 2019-01-02 – 2019-01-03 (×2): 2 [IU] via SUBCUTANEOUS
  Administered 2019-01-03: 13:00:00 1 [IU] via SUBCUTANEOUS
  Administered 2019-01-04: 12:00:00 3 [IU] via SUBCUTANEOUS
  Administered 2019-01-04: 08:00:00 1 [IU] via SUBCUTANEOUS
  Filled 2018-12-31: qty 0.09

## 2018-12-31 MED ORDER — SODIUM CHLORIDE 0.9 % IV SOLN
100.0000 mg | INTRAVENOUS | Status: AC
  Administered 2019-01-01 – 2019-01-04 (×4): 100 mg via INTRAVENOUS
  Filled 2018-12-31 (×4): qty 20

## 2018-12-31 MED ORDER — METHYLPREDNISOLONE SODIUM SUCC 125 MG IJ SOLR
60.0000 mg | Freq: Two times a day (BID) | INTRAMUSCULAR | Status: DC
  Administered 2019-01-01: 05:00:00 60 mg via INTRAVENOUS
  Filled 2018-12-31: qty 2

## 2018-12-31 MED ORDER — SODIUM CHLORIDE 0.9 % IV SOLN
200.0000 mg | Freq: Once | INTRAVENOUS | Status: AC
  Administered 2018-12-31: 200 mg via INTRAVENOUS
  Filled 2018-12-31: qty 40

## 2018-12-31 MED ORDER — POTASSIUM CHLORIDE CRYS ER 20 MEQ PO TBCR
40.0000 meq | EXTENDED_RELEASE_TABLET | Freq: Once | ORAL | Status: AC
  Administered 2018-12-31: 19:00:00 40 meq via ORAL
  Filled 2018-12-31: qty 2

## 2018-12-31 MED ORDER — VITAMIN C 500 MG PO TABS
500.0000 mg | ORAL_TABLET | Freq: Every day | ORAL | Status: DC
  Administered 2018-12-31 – 2019-01-04 (×5): 500 mg via ORAL
  Filled 2018-12-31 (×5): qty 1

## 2018-12-31 MED ORDER — ONDANSETRON HCL 4 MG/2ML IJ SOLN
4.0000 mg | Freq: Once | INTRAMUSCULAR | Status: AC
  Administered 2018-12-31: 11:00:00 4 mg via INTRAVENOUS
  Filled 2018-12-31: qty 2

## 2018-12-31 MED ORDER — ONDANSETRON HCL 4 MG/2ML IJ SOLN
4.0000 mg | Freq: Four times a day (QID) | INTRAMUSCULAR | Status: DC | PRN
  Administered 2018-12-31 – 2019-01-02 (×3): 4 mg via INTRAVENOUS
  Filled 2018-12-31 (×3): qty 2

## 2018-12-31 MED ORDER — GUAIFENESIN-DM 100-10 MG/5ML PO SYRP: 10.0000 mL | ORAL_SOLUTION | ORAL | Status: DC | PRN

## 2018-12-31 MED ORDER — ACETAMINOPHEN 325 MG PO TABS
650.0000 mg | ORAL_TABLET | Freq: Once | ORAL | Status: AC
  Administered 2018-12-31: 12:00:00 650 mg via ORAL
  Filled 2018-12-31: qty 2

## 2018-12-31 MED ORDER — SODIUM CHLORIDE 0.9 % IV BOLUS
1000.0000 mL | Freq: Once | INTRAVENOUS | Status: AC
  Administered 2018-12-31: 1000 mL via INTRAVENOUS

## 2018-12-31 MED ORDER — METHYLPREDNISOLONE SODIUM SUCC 125 MG IJ SOLR
60.0000 mg | INTRAMUSCULAR | Status: DC
  Administered 2018-12-31: 60 mg via INTRAVENOUS

## 2018-12-31 MED ORDER — SODIUM CHLORIDE 0.9% FLUSH
3.0000 mL | Freq: Two times a day (BID) | INTRAVENOUS | Status: DC
  Administered 2018-12-31 – 2019-01-04 (×8): 3 mL via INTRAVENOUS

## 2018-12-31 NOTE — ED Notes (Signed)
Notified patient's husband of transfer to [facility] and room #

## 2018-12-31 NOTE — ED Notes (Signed)
Patient stated to PA that she is having no symptoms r/t COVID. Spoke to patient's husband, husband states patient is coughing and gurgling, hasn't eaten in 3/4 days. Husband states patient has diarrhea. Husband states over weekend, patient went to [facility] for same symptoms and was later discharged. Husband says patient is in denial of diagnosis. Husband says patient has not eaten at all or had any hydration in 3/4 days.

## 2018-12-31 NOTE — ED Notes (Signed)
POC occult blood collected by PA at bedside with writer present. When patient rolled on to side for collection, o2 sat again dropped to 88% and patient became short of breath. Patient required assistance with bed mobility due to SOBR.

## 2018-12-31 NOTE — ED Notes (Signed)
Contacted [facility] to transport patient to [place]. Was given ETA of 30 minutes.

## 2018-12-31 NOTE — H&P (Signed)
History and Physical  Hailey Greene IOE:[id number] DOB: 28-Jan-1951 DOA: 12/31/2018  PCP: Blane Ohara, MD   Chief Complaint: Short of breath, COVID positive  HPI:  68 year old woman PMH including diabetes, hypertension, diagnosis COVID 10/3, PRESENTED to [facility] emergency department for failure to thrive, approximately 7 days without food and little to drink, shortness of breath, diarrhea.  Patient developed symptoms last Thursday 10/1 with cough, myalgias, shortness of breath and dyspnea on exertion as well as nausea, vomiting and diarrhea.  Symptoms gradually worsened.  She was seen in the emergency department at [facility] yesterday and discharged home.  Came back today for worsening symptoms.  Chart review: . 10/4 [place] ER visit: Presented for failure to thrive, diarrhea.  COVID positive.  Chest x-ray vague airspace opacities left midlung and lower lung, consider viral pneumonia.  EKG sinus rhythm, prolonged QT 469.  WBC 2.6, sodium 128, chloride 93, creatinine 1.7, ferritin 642, ALT 35, AST 40, LDH 595, CRP 11.2 . No hospitalizations or clinic notes in epic within the last 3 years.  ED Course: Nursing documentation summary: Patient coughing and gurgling, has not eaten in 3 to 4 days, diarrhea, seen at [facility] in the last 48 hours, patient denial of diagnosis.  Positive COVID test 12/29/2018 at [place].  Sat dropped to 89% and developed shortness of breath when collecting rectal temperature.  Review of Systems:  No fever, visual changes, sore throat, rash, chest pain, abdominal pain, diarrhea now, dysuria, bleeding  Positive for myalgias, vomiting, diarrhea recently, cough  PMH . Diabetes mellitus type 2 with peripheral neuropathy . Essential hypertension . Depression  PSH . Abdominal hysterectomy . Appendectomy . Tonsillectomy  Knee replacement  Social History . Drinks alcohol, no drugs, former smoker . Uses a walker  Allergies . None  Family history  includes: . Father with brain cancer, maternal grandmother with heart disease  Meds include: . Gabapentin 300 mg at night . Nortriptyline 25 mg at night  Physical Exam: Vitals:  . Temperature 100.1, respirations 19, pulse 86, blood pressure 161/81  Constitutional:   . Appears calm, mildly uncomfortable, nontoxic Eyes:  . pupils and irises appear normal . Normal lids ENMT:  . grossly normal hearing  . Lips appear normal Neck:  . neck appears normal, no masses . no thyromegaly Respiratory:  . Coarse breath sounds bilaterally with some rhonchi..  . Mild increased respiratory effort.  Able to speak in full sentences.  Wet cough. Cardiovascular:  . RRR, no m/r/g . No LE extremity edema   Abdomen:  . Obese, soft, nontender, nondistended.  No hernias noted. Musculoskeletal:  . Digits/nails BUE/bilateral lower extremities: no clubbing, cyanosis, petechiae, infection . RUE, LUE, RLE, LLE   . Grossly normal tone Skin:  . No rashes, lesions, ulcers . palpation of skin: no induration or nodules Psychiatric:  . Mental status o Mood, affect appropriate o Orientation to person, hospital, year  I have personally reviewed following labs and imaging studies  Labs:  Marland Kitchen Potassium 3.2, LFTs unremarkable . BNP 51, high-sensitivity troponin 4 . Lactic acid reassuring at 1.2 . CBC unremarkable . Fecal occult blood positive . D-dimer 0.43  Medical tests:   EKG independently reviewed: Sinus rhythm, prolonged QT  Significant Hospital Events   . 10/5 admit for COVID pneumonitis/pneumonia   Consults:  .    Procedures:  .   Significant Diagnostic Tests:  . 10/5 SARS-CoV-2 positive . 10/5 chest x-ray opacities left chest consistent with viral pneumonia (independently reviewed)   Micro Data:  .  10/5 blood cultures >    Antimicrobials:  .   ASSESSMENT/PLAN  COVID pneumonitis/pneumonia with associated acute hypoxic respiratory failure, FTT, abnormal chest x-ray, nausea,  vomiting and diarrhea. --Appears calm and comfortable on no oxygen at the present moment but was hypoxic earlier with movement.  Steroids 10/5 > , remdesivir 10/5 >, supplemental oxygen for SPO2 less than 94%. --COVID protocol, inflammatory labs now and in the morning --No signs or symptoms to suggest bacterial infection --CRP was high at [facility], follow-up study here.  Given minimal oxygen requirement and well-appearing status, and pending procalcitonin will defer Actemra for now.  She has no contraindications identified and was counseled and accepts the medication if needed.  Diabetes mellitus type 2 with peripheral neuropathy --Sliding scale insulin  Essential hypertension --Stable  Hypokalemia --Replete  Prolonged QT --Replete potassium.  Check magnesium.  Check EKG in a.m.  DVT prophylaxis:enoxaparin Code Status: Full Family Communication: husband by telephone Consults called: none    Time spent: 60 minutes  Murray Hodgkins, MD  [facility] Direct contact: see [email or web address]  7PM-7AM contact night coverage as below   1. Check the care team in [facility] and look for a) attending/consulting [facility] provider listed and b) the [facility] team listed 2. Log into [email or web address] and use [place]'s universal password to access. If you do not have the password, please contact the hospital operator. 3. Locate the [facility] provider you are looking for under [facility] and page to a number that you can be directly reached. 4. If you still have difficulty reaching the provider, please page the [facility] (Director on Call) for the Hospitalists listed on amion for assistance.  Severity of Illness: The appropriate patient status for this patient is INPATIENT. Inpatient status is judged to be reasonable and necessary in order to provide the required intensity of service to ensure the patient's safety. The patient's presenting symptoms, physical exam findings, and initial radiographic and laboratory data  in the context of their chronic comorbidities is felt to place them at high risk for further clinical deterioration. Furthermore, it is not anticipated that the patient will be medically stable for discharge from the hospital within 2 midnights of admission. The following factors support the patient status of inpatient.   " The patient's presenting symptoms include short of breath, n/v/d, cough. " The worrisome physical exam findings include bilateral rhonchi, cough. " The initial radiographic and laboratory data are worrisome because of left CXR abnormality. " The chronic co-morbidities include DM.   * I certify that at the point of admission it is my clinical judgment that the patient will require inpatient hospital care spanning beyond 2 midnights from the point of admission due to high intensity of service, high risk for further deterioration and high frequency of surveillance required.*   12/31/2018, 3:46 PM   Principal Problem:   2019 novel coronavirus disease (COVID-19) Active Problems:   Sensory neuropathy   Type II diabetes mellitus, uncontrolled ([place])   Benign essential HTN   Prolonged QT interval

## 2018-12-31 NOTE — Progress Notes (Signed)
Pharmacy Note - Remdesivir Dosing  O:  ALT: 26 CXR: Vague airspace opacities in the left chest, - or - Requiring supplemental O2: none currently   A/P:  Patient meets criteria for remdesivir.  Begin remdesivir 200 mg IV x 1, followed by 100 mg IV daily x 4 days  Monitor ALT, clinical progress  Peggyann Juba, PharmD, [place] [phone] 12/31/2018 5:09 PM

## 2018-12-31 NOTE — ED Notes (Signed)
ED TO INPATIENT HANDOFF REPORT  ED Nurse Name and Phone #: Elonzo Sopp RN [phone]  S Name/Age/Gender Hailey Greene 68 y.o. female Room/Bed: WA13/WA13  Code Status   Code Status: Not on file  Home/SNF/Other Home Patient oriented to: self, place, time and situation Is this baseline? Yes   Triage Complete: Triage complete  Chief Complaint covid pos  Triage Note Patient denies symptoms. Patient's husband states patient has had vomiting and diarrhea for 4 days. Per husband, patient hasn't eaten any food since last Tuesday, nothing to drink in 4 days.    Allergies No Known Allergies  Level of Care/Admitting Diagnosis ED Disposition    ED Disposition Condition Comment   Admit  Hospital Area: [facility] [[id number]]  Level of Care: Med-Surg [16]  Covid Evaluation: Confirmed COVID Positive  Diagnosis: 2019 novel coronavirus disease (COVID-19) [[id number]]  Admitting Physician: Standley Brooking [[id number]]  Attending Physician: Standley Brooking [[id number]]  Estimated length of stay: past midnight tomorrow  Certification:: I certify this patient will need inpatient services for at least 2 midnights  PT Class (Do Not Modify): Inpatient [101]  PT Acc Code (Do Not Modify): Private [1]       B Medical/Surgery History Past Medical History:  Diagnosis Date  . Chicken pox   . Depression   . Diabetes   . Hypertension   . Neuropathy   . Tachycardia   . UTI (lower urinary tract infection)   . Weakness    Past Surgical History:  Procedure Laterality Date  . ABDOMINAL HYSTERECTOMY    . APPENDECTOMY    . TONSILLECTOMY       A IV Location/Drains/Wounds Patient Lines/Drains/Airways Status   Active Line/Drains/Airways    Name:   Placement date:   Placement time:   Site:   Days:   Peripheral IV 12/31/18 Right Antecubital   12/31/18    1025    Antecubital   less than 1          Intake/Output Last 24 hours No intake or output data in the 24 hours ending  12/31/18 1322  Labs/Imaging Results for orders placed or performed during the hospital encounter of 12/31/18 (from the past 48 hour(s))  CBC with Differential     Status: Abnormal   Collection Time: 12/31/18 10:28 AM  Result Value Ref Range   WBC 5.4 4.0 - 10.5 K/uL   RBC 5.17 (H) 3.87 - 5.11 MIL/uL   Hemoglobin 14.6 12.0 - 15.0 g/dL   HCT 85.2 77.8 - 24.2 %   MCV 84.3 80.0 - 100.0 fL   MCH 28.2 26.0 - 34.0 pg   MCHC 33.5 30.0 - 36.0 g/dL   RDW 35.3 61.4 - 43.1 %   Platelets 231 150 - 400 K/uL   nRBC 0.0 0.0 - 0.2 %   Neutrophils Relative % 80 %   Neutro Abs 4.4 1.7 - 7.7 K/uL   Lymphocytes Relative 10 %   Lymphs Abs 0.5 (L) 0.7 - 4.0 K/uL   Monocytes Relative 9 %   Monocytes Absolute 0.5 0.1 - 1.0 K/uL   Eosinophils Relative 0 %   Eosinophils Absolute 0.0 0.0 - 0.5 K/uL   Basophils Relative 0 %   Basophils Absolute 0.0 0.0 - 0.1 K/uL   Immature Granulocytes 1 %   Abs Immature Granulocytes 0.03 0.00 - 0.07 K/uL    Comment: Performed at [facility], [address], [place], [place] [address]  Comprehensive metabolic panel  Status: Abnormal   Collection Time: 12/31/18 10:28 AM  Result Value Ref Range   Sodium 134 (L) 135 - 145 mmol/L   Potassium 3.2 (L) 3.5 - 5.1 mmol/L   Chloride 98 98 - 111 mmol/L   CO2 24 22 - 32 mmol/L   Glucose, Bld 155 (H) 70 - 99 mg/dL   BUN 18 8 - 23 mg/dL   Creatinine, Ser 0.62 0.44 - 1.00 mg/dL   Calcium 8.9 8.9 - 37.6 mg/dL   Total Protein 7.4 6.5 - 8.1 g/dL   Albumin 4.1 3.5 - 5.0 g/dL   AST 22 15 - 41 U/L   ALT 26 0 - 44 U/L   Alkaline Phosphatase 89 38 - 126 U/L   Total Bilirubin 0.8 0.3 - 1.2 mg/dL   GFR calc non Af Amer >60 >60 mL/min   GFR calc Af Amer >60 >60 mL/min   Anion gap 12 5 - 15    Comment: Performed at [facility], [address] [place], [place] [address]  Lipase, blood     Status: None   Collection Time: 12/31/18 10:28 AM  Result Value Ref Range   Lipase 27 11 - 51 U/L     Comment: Performed at [facility], [address], [place], [place] [address]  Troponin I (High Sensitivity)     Status: None   Collection Time: 12/31/18 10:28 AM  Result Value Ref Range   Troponin I (High Sensitivity) 4 <18 ng/L    Comment: (NOTE) Elevated high sensitivity troponin I (hsTnI) values and significant  changes across serial measurements may suggest ACS but many other  chronic and acute conditions are known to elevate hsTnI results.  Refer to the "Links" section for chest pain algorithms and additional  guidance. Performed at [facility], [address], [place], [place] [address]   D-dimer, quantitative (not at [facility])     Status: None   Collection Time: 12/31/18 10:28 AM  Result Value Ref Range   D-Dimer, Quant 0.43 0.00 - 0.50 ug/mL-FEU    Comment: (NOTE) At the manufacturer cut-off of 0.50 ug/mL FEU, this assay has been documented to exclude PE with a sensitivity and negative predictive value of 97 to 99%.  At this time, this assay has not been approved by the FDA to exclude DVT/VTE. Results should be correlated with clinical presentation. Performed at [facility], [address] [place], [place] [address]   Lactic acid, plasma     Status: None   Collection Time: 12/31/18 10:28 AM  Result Value Ref Range   Lactic Acid, Venous 1.2 0.5 - 1.9 mmol/L    Comment: Performed at [facility], [address], [place], [place] [address]  Blood culture (routine x 2)     Status: None (Preliminary result)   Collection Time: 12/31/18 10:40 AM   Specimen: BLOOD  Result Value Ref Range   Specimen Description      BLOOD RIGHT ANTECUBITAL Performed at [facility], [address] [place], [place] [address]    Special Requests      BOTTLES DRAWN AEROBIC AND ANAEROBIC Blood Culture adequate volume Performed at [facility], [address] [place], [place] [address]     Culture PENDING    Report Status PENDING   SARS Coronavirus 2 [facility] order, Performed in [facility] hospital lab) Nasopharyngeal Nasopharyngeal Swab     Status: Abnormal   Collection Time: 12/31/18 10:46 AM  Specimen: Nasopharyngeal Swab  Result Value Ref Range   SARS Coronavirus 2 POSITIVE (A) NEGATIVE    Comment: RESULT CALLED TO, READ BACK BY AND VERIFIED WITH:  MADDIE 12/31/18 1151 JM (NOTE) If result is NEGATIVE SARS-CoV-2 target nucleic acids are NOT DETECTED. The SARS-CoV-2 RNA is generally detectable in upper and lower  respiratory specimens during the acute phase of infection. The lowest  concentration of SARS-CoV-2 viral copies this assay can detect is 250  copies / mL. A negative result does not preclude SARS-CoV-2 infection  and should not be used as the sole basis for treatment or other  patient management decisions.  A negative result may occur with  improper specimen collection / handling, submission of specimen other  than nasopharyngeal swab, presence of viral mutation(s) within the  areas targeted by this assay, and inadequate number of viral copies  (<250 copies / mL). A negative result must be combined with clinical  observations, patient history, and epidemiological information. If result is POSITIVE SARS-CoV-2 target nucleic acids are DETECTED. The SARS-CoV- 2 RNA is generally detectable in upper and lower  respiratory specimens during the acute phase of infection.  Positive  results are indicative of active infection with SARS-CoV-2.  Clinical  correlation with patient history and other diagnostic information is  necessary to determine patient infection status.  Positive results do  not rule out bacterial infection or co-infection with other viruses. If result is PRESUMPTIVE POSTIVE SARS-CoV-2 nucleic acids MAY BE PRESENT.   A presumptive positive result was obtained on the submitted specimen  and confirmed on repeat testing.  While 2019 novel coronavirus   (SARS-CoV-2) nucleic acids may be present in the submitted sample  additional confirmatory testing may be necessary for epidemiological  and / or clinical management purposes  to differentiate between  SARS-CoV-2 and other Sarbecovirus currently known to infect humans.  If clinically indicated additional testing with an alternate test  methodology [phone]) is advised. Th e SARS-CoV-2 RNA is generally  detectable in upper and lower respiratory specimens during the acute  phase of infection. The expected result is Negative. Fact Sheet for Patients:  BoilerBrush.com.cyhttps://www.fda.gov/media/136312/download Fact Sheet for Healthcare Providers: https://pope.com/https://www.fda.gov/media/136313/download This test is not yet approved or cleared by the [place] FDA and has been authorized for detection and/or diagnosis of SARS-CoV-2 by FDA under an Emergency Use Authorization (EUA).  This EUA will remain in effect (meaning this test can be used) for the duration of the COVID-19 declaration under Section 564(b)(1) of the Act, 21 U.S.C. section 360bbb-3(b)(1), unless the authorization is terminated or revoked sooner. Performed at [facility], [address] [place], [place] [id number]   POC occult blood, ED RN will collect     Status: Abnormal   Collection Time: 12/31/18 12:06 PM  Result Value Ref Range   Fecal Occult Bld POSITIVE (A) NEGATIVE   Dg Chest Portable 1 View  Result Date: 12/31/2018 CLINICAL DATA:  Coban positive, shortness of breath. EXAM: PORTABLE CHEST 1 VIEW COMPARISON:  12/30/2018 FINDINGS: Heart size mediastinal contours are stable. Persistent left basilar and peripheral opacities are noted. No signs of dense consolidation or pleural effusion. No acute bone finding. IMPRESSION: Vague airspace opacities in the left chest, could be seen in setting of viral pneumonia. Consider follow-up to ensure resolution. Electronically Signed   By: Donzetta KohutGeoffrey  Wile M.D.   On: 12/31/2018 11:30     Pending Labs [employer] (From admission, onward)    Start     Ordered  12/31/18 1017  Lactic acid, plasma  Now then every 2 hours,   STAT     12/31/18 1016   12/31/18 1017  Blood culture (routine x 2)  BLOOD CULTURE X 2,   STAT     12/31/18 1016   12/31/18 1011  Urinalysis, Routine w reflex microscopic  Once,   STAT     12/31/18 1010   12/31/18 1010  Brain natriuretic peptide  Once,   STAT     12/31/18 1010          Vitals/Pain Today's Vitals   12/31/18 1130 12/31/18 1146 12/31/18 1230 12/31/18 1300  BP: (!) 159/74  (!) 158/77 (!) 155/87  Pulse: 87  94 89  Resp: 15  17 20   Temp:      TempSrc:      SpO2: 98%  94% 94%  Weight:  106.6 kg    Height:  5\' 3"  (1.6 m)    PainSc:  0-No pain      Isolation Precautions Airborne and Contact precautions  Medications Medications  sodium chloride 0.9 % bolus 1,000 mL (0 mLs Intravenous Stopped 12/31/18 1310)  ondansetron (ZOFRAN) injection 4 mg (4 mg Intravenous Given 12/31/18 1042)  acetaminophen (TYLENOL) tablet 650 mg (650 mg Oral Given 12/31/18 1206)    Mobility walks with device Low fall risk   Focused Assessments    R Recommendations: See Admitting Provider Note  Report given to:   Additional Notes:

## 2018-12-31 NOTE — ED Notes (Addendum)
Attempted to call report to [phone]. Staff not available to take report at this time.

## 2018-12-31 NOTE — ED Triage Notes (Signed)
Patient denies symptoms. Patient's husband states patient has had vomiting and diarrhea for 4 days. Per husband, patient hasn't eaten any food since last Tuesday, nothing to drink in 4 days.

## 2018-12-31 NOTE — ED Notes (Signed)
When rolling patient from side to side to collect rectal temp, patient's o2 sat dropped to 89% and patient became short of breath.

## 2018-12-31 NOTE — ED Notes (Addendum)
Called [facility] to speak with charge to give report. Staff notified Probation officer that charge nurse is not there either. Informed [facility] staff we will have to send to patient when [facility] arrives.  [facility] Charge Patty contacted [facility]. [facility] to contact [facility].

## 2018-12-31 NOTE — ED Notes (Signed)
Patient positive COVID test 12/29/2018 at [facility]

## 2018-12-31 NOTE — Plan of Care (Signed)
Pt admitted for covid. Education given at this time, will continue to reinforce throughout admission.    Problem: Education: Goal: Knowledge of risk factors and measures for prevention of condition will improve Outcome: Progressing   Problem: Coping: Goal: Psychosocial and spiritual needs will be supported Outcome: Progressing   Problem: Respiratory: Goal: Will maintain a patent airway Outcome: Progressing Goal: Complications related to the disease process, condition or treatment will be avoided or minimized Outcome: Progressing

## 2018-12-31 NOTE — ED Provider Notes (Signed)
[facility]-EMERGENCY DEPT Provider Note   CSN: [id number] Arrival date & time: 12/31/18  4098   History   Chief Complaint Chief Complaint  Patient presents with  . Shortness of Breath    COVID +    HPI Hailey Greene is a 68 y.o. female with past medical history significant for depression, diabetes, hypertension who presents for evaluation of multiple complaints.  Patient diagnosed with COVID on Saturday.  Patient she was taken via EMS to [facility] on Saturday for decreased p.o. intake.  They discharged her home.  According to patient's husband patient has had not any solid food intake since last Tuesday.  She has occasionally taken sips of ginger ale however has no appetite.  Patient with complaints of shortness of breath, watery diarrhea as well as generalized weakness.  Patient states she has not been taking anything for her symptoms.  She denies headache, vision changes, sore throat, congestion, rhinorrhea, chest pain, dysuria.  Denies additional aggravating or alleviating factors.  Patient's husband called their family provider who will family to take patient to the emergency department again due to decreased p.o. intake at home.  History obtained from patient, husband over the phone and past medical records. No interpreter was used.  Husband states patient is at baseline mentation.    HPI  Past Medical History:  Diagnosis Date  . Chicken pox   . Depression   . Diabetes   . Hypertension   . Neuropathy   . Tachycardia   . UTI (lower urinary tract infection)   . Weakness     Patient Active Problem List   Diagnosis Date Noted  . 2019 novel coronavirus disease (COVID-19) 12/31/2018  . Type II diabetes mellitus, uncontrolled (HCC) 05/26/2014  . Sensory neuropathy 04/11/2014  . Muscle spasm 10/12/2013  . Inability to walk 10/12/2013  . Areflexia 10/12/2013    Past Surgical History:  Procedure Laterality Date  . ABDOMINAL HYSTERECTOMY    .  APPENDECTOMY    . TONSILLECTOMY       OB History   No obstetric history on file.      Home Medications    Prior to Admission medications   Medication Sig Start Date End Date Taking? Authorizing Provider  gabapentin (NEURONTIN) 300 MG capsule Take 1 tablet bedtime for 1 week, then increase to 1 tablet twice daily. 04/11/14   Nita Sickle K, DO  nortriptyline (PAMELOR) 25 MG capsule Take 1 capsule (25 mg total) by mouth at bedtime. 02/05/14   Glendale Chard, DO    Family History Family History  Problem Relation Age of Onset  . Brain cancer Father   . Heart disease Maternal Grandmother     Social History Social History   Tobacco Use  . Smoking status: Former Smoker    Packs/day: 2.00    Years: 12.00    Pack years: 24.00    Types: Cigarettes    Quit date: 05/25/2010    Years since quitting: 8.6  . Smokeless tobacco: Never Used  Substance Use Topics  . Alcohol use: No  . Drug use: No     Allergies   Patient has no known allergies.   Review of Systems Review of Systems  Constitutional: Positive for activity change and appetite change. Negative for chills, diaphoresis, fatigue and fever.  HENT: Negative.   Respiratory: Positive for cough and shortness of breath. Negative for apnea, choking, chest tightness, wheezing and stridor.   Cardiovascular: Negative.   Gastrointestinal: Positive for abdominal  pain (Generalized), diarrhea and nausea. Negative for anal bleeding, blood in stool, constipation and vomiting.  Genitourinary: Negative.   Musculoskeletal: Negative.   Skin: Negative.   Neurological: Negative.   All other systems reviewed and are negative.  Physical Exam Updated Vital Signs BP (!) 155/87   Pulse 89   Temp 100.1 F (37.8 C) (Rectal)   Resp 20   Ht  (1.6 m)   Wt 106.6 kg   SpO2 94%   BMI 41.63 kg/m   Physical Exam Vitals signs and nursing note reviewed.  Constitutional:      General: She is not in acute distress.    Appearance: She  is not ill-appearing, toxic-appearing or diaphoretic.  HENT:     Head: Normocephalic and atraumatic.     Jaw: There is normal jaw occlusion.     Mouth/Throat:     Mouth: Mucous membranes are moist.     Pharynx: Oropharynx is clear.  Neck:     Musculoskeletal: Full passive range of motion without pain, normal range of motion and neck supple.     Vascular: No carotid bruit or JVD.     Trachea: Trachea and phonation normal.  Cardiovascular:     Rate and Rhythm: Normal rate.     Pulses: Normal pulses.          Radial pulses are 2+ on the right side and 2+ on the left side.       Posterior tibial pulses are 2+ on the right side and 2+ on the left side.     Heart sounds: Normal heart sounds.  Pulmonary:     Effort: Pulmonary effort is normal.     Breath sounds: Normal breath sounds and air entry.  Abdominal:     Comments: Soft, nontender without rebound or guarding.  Genitourinary:    Rectum: Guaiac result positive. Internal hemorrhoid present. No mass, tenderness, anal fissure or external hemorrhoid. Normal anal tone.     Comments: Brown stool in rectal vault with good tone.  Internal hemorrhoids. Musculoskeletal: Normal range of motion.     Right lower leg: She exhibits no tenderness. No edema.     Left lower leg: She exhibits no tenderness. No edema.     Comments: Moves all 4 extremities without difficulty.  Lower extremity without edema, tenderness, warmth.  Skin:    Comments: Brisk capillary refill.  No pallor.  Neurological:     Mental Status: She is alert.     Comments: Cranial nerves II through XII grossly intact.  Negative finger-to-nose, negative heel-to-shin.  No facial droop.    ED Treatments / Results  Labs (all labs ordered are listed, but only abnormal results are displayed) Labs Reviewed  SARS CORONAVIRUS 2 (HOSPITAL ORDER, PERFORMED IN [facility]) - Abnormal; Notable for the following components:      Result Value   SARS Coronavirus 2 POSITIVE (*)     All other components within normal limits  CBC WITH DIFFERENTIAL/PLATELET - Abnormal; Notable for the following components:   RBC 5.17 (*)    Lymphs Abs 0.5 (*)    All other components within normal limits  COMPREHENSIVE METABOLIC PANEL - Abnormal; Notable for the following components:   Sodium 134 (*)    Potassium 3.2 (*)    Glucose, Bld 155 (*)    All other components within normal limits  POC OCCULT BLOOD, ED - Abnormal; Notable for the following components:   Fecal Occult Bld POSITIVE (*)    All  other components within normal limits  CULTURE, BLOOD (ROUTINE X 2)  CULTURE, BLOOD (ROUTINE X 2)  LIPASE, BLOOD  D-DIMER, QUANTITATIVE (NOT AT [facility])  LACTIC ACID, PLASMA  BRAIN NATRIURETIC PEPTIDE  URINALYSIS, ROUTINE W REFLEX MICROSCOPIC  LACTIC ACID, PLASMA  POC OCCULT BLOOD, ED  TROPONIN I (HIGH SENSITIVITY)  TROPONIN I (HIGH SENSITIVITY)    EKG None  Radiology Dg Chest Portable 1 View  Result Date: 12/31/2018 CLINICAL DATA:  Coban positive, shortness of breath. EXAM: PORTABLE CHEST 1 VIEW COMPARISON:  12/30/2018 FINDINGS: Heart size mediastinal contours are stable. Persistent left basilar and peripheral opacities are noted. No signs of dense consolidation or pleural effusion. No acute bone finding. IMPRESSION: Vague airspace opacities in the left chest, could be seen in setting of viral pneumonia. Consider follow-up to ensure resolution. Electronically Signed   By: Donzetta KohutGeoffrey  Wile M.D.   On: 12/31/2018 11:30    Procedures Procedures (including critical care time)  Medications Ordered in ED Medications  sodium chloride 0.9 % bolus 1,000 mL (0 mLs Intravenous Stopped 12/31/18 1310)  ondansetron (ZOFRAN) injection 4 mg (4 mg Intravenous Given 12/31/18 1042)  acetaminophen (TYLENOL) tablet 650 mg (650 mg Oral Given 12/31/18 1206)   Initial Impression / Assessment and Plan / ED Course  I have reviewed the triage vital signs and the nursing notes.  Pertinent labs & imaging  results that were available during my care of the patient were reviewed by me and considered in my medical decision making (see chart for details).  68 year old female presents for evaluation multiple complaints.  Nonseptic appearing.  Rectal temp at 100.1.  Decreased p.o. intake, shortness of breath, generalized abdominal pain as well as diarrhea. [facility] without resolve of symptoms on Saturday.  Heart and lungs clear. She does appear mildly dry membranes. Abdomen soft, nontender without rebound or guarding.  She has had generalized weakness however nonfocal neurologic exam with negative finger-to-nose, heel-to-shin.  No unilateral weakness, sudden onset thunderclap headache.  Low suspicion for CVA, dissection as cause of weakness.  No evidence of DVT on exam however she has had decreased movement since her COVID diagnosis.  Will obtain labs, imaging.  I do not see any findings of her COVID diagnosis in epic.  Will also provide symptomatic management.  Labs and imaging personally reviewed: Lactic acid 1.2 Ddimer 0.43 CBC without leukocytosis. Hgb 14.6 Urinalysis pending Lipase 27 Troponin 4 BNP pending Occult blood positive, internal hemorrhoids on exam. No gross blood on exam. Have low suspicion for gastrointestinal hemorrhage, infectious process causing her occult positive stool. COVID positive DG chest airspace opacities in Left chest, possibly viral in nature. She  Rectal temp at 100.1. Given Tylenol PO.  Patient became hypoxic to 88% on RA, tachypnic and SOB with movement to the edge of the bed. Recovers when resting in bed. See nursing note. Uses walker at baseline and does not want to attempt further ambulation. Will need admission for hypoxia likely due to COVID 19. Low suspicion for ACS, PE, dissection, [place].  On reevaluation patient abdomen soft, nontender without rebound or guarding.  Does have occult positive however she does have internal hemorrhoids on exam.   No gross blood or melanotic stool. On reevaluation her abdomen is soft, nontender without rebound or guarding.  Low suspicion for diverticulitis, acute GI bleed, acute bacterial infectious process as cause of her diarrhea. Nonsurgical abdomen. Continues to have nonfocal neurologic exam without deficits.  Patient to be admitted for hypoxia likely due to her COVID  status.  No evidence of sepsis or sirs at this time.  The patient appears reasonably stabilized for admission considering the current resources, flow, and capabilities available in the ED at this time, and I doubt any other [facility] requiring further screening and/or treatment in the ED prior to admission.  Patient was discussed with attending physician, Dr. Vallery Ridge who agrees with above treatment, plan and disposition.       Hailey Greene was evaluated in Emergency Department on 12/31/2018 for the symptoms described in the history of present illness. She was evaluated in the context of the global COVID-19 pandemic, which necessitated consideration that the patient might be at risk for infection with the SARS-CoV-2 virus that causes COVID-19. Institutional protocols and algorithms that pertain to the evaluation of patients at risk for COVID-19 are in a state of rapid change based on information released by regulatory bodies including the CDC and federal and state organizations. These policies and algorithms were followed during the patient's care in the ED. Final Clinical Impressions(s) / ED Diagnoses   Final diagnoses:  COVID-19  Acute respiratory failure with hypoxia (HCC)  Diarrhea, unspecified type    ED Discharge Orders    None       Ridhaan Dreibelbis A, PA-C 12/31/18 1314    Charlesetta Shanks, MD 01/01/19 [phone]

## 2018-12-31 NOTE — ED Notes (Signed)
Date and time results received: 12/31/18 11:56 (use smartphrase ".now" to insert current time)  Test: COVID Critical Value: positive  Name of Provider Notified: Britni PA

## 2018-12-31 NOTE — Progress Notes (Signed)
Called and updated the pt's husband Shanon Brow. Informed him of pt's condition at this time and medications she will be receiving. He denies any questions or concerns. I explained that a nurse will be updating him twice daily for the duration of her stay. Encouraged him to contact the staff for any concerns.

## 2019-01-01 LAB — CBC WITH DIFFERENTIAL/PLATELET
Abs Immature Granulocytes: 0.02 10*3/uL (ref 0.00–0.07)
Basophils Absolute: 0 10*3/uL (ref 0.0–0.1)
Basophils Relative: 0 %
Eosinophils Absolute: 0 10*3/uL (ref 0.0–0.5)
Eosinophils Relative: 0 %
HCT: 40.7 % (ref 36.0–46.0)
Hemoglobin: 13.3 g/dL (ref 12.0–15.0)
Immature Granulocytes: 1 %
Lymphocytes Relative: 15 %
Lymphs Abs: 0.4 10*3/uL — ABNORMAL LOW (ref 0.7–4.0)
MCH: 27.8 pg (ref 26.0–34.0)
MCHC: 32.7 g/dL (ref 30.0–36.0)
MCV: 85 fL (ref 80.0–100.0)
Monocytes Absolute: 0.1 10*3/uL (ref 0.1–1.0)
Monocytes Relative: 4 %
Neutro Abs: 2 10*3/uL (ref 1.7–7.7)
Neutrophils Relative %: 80 %
Platelets: 226 10*3/uL (ref 150–400)
RBC: 4.79 MIL/uL (ref 3.87–5.11)
RDW: 15.1 % (ref 11.5–15.5)
WBC: 2.5 10*3/uL — ABNORMAL LOW (ref 4.0–10.5)
nRBC: 0 % (ref 0.0–0.2)

## 2019-01-01 LAB — MAGNESIUM: Magnesium: 2 mg/dL (ref 1.7–2.4)

## 2019-01-01 LAB — ABO/RH: ABO/RH(D): A POS

## 2019-01-01 LAB — TYPE AND SCREEN
ABO/RH(D): A POS
Antibody Screen: NEGATIVE

## 2019-01-01 LAB — HIV ANTIBODY (ROUTINE TESTING W REFLEX): HIV Screen 4th Generation wRfx: NONREACTIVE

## 2019-01-01 LAB — COMPREHENSIVE METABOLIC PANEL
ALT: 22 U/L (ref 0–44)
AST: 19 U/L (ref 15–41)
Albumin: 3.4 g/dL — ABNORMAL LOW (ref 3.5–5.0)
Alkaline Phosphatase: 81 U/L (ref 38–126)
Anion gap: 13 (ref 5–15)
BUN: 16 mg/dL (ref 8–23)
CO2: 22 mmol/L (ref 22–32)
Calcium: 8.4 mg/dL — ABNORMAL LOW (ref 8.9–10.3)
Chloride: 103 mmol/L (ref 98–111)
Creatinine, Ser: 0.65 mg/dL (ref 0.44–1.00)
GFR calc Af Amer: >60 mL/min (ref >60)
GFR calc non Af Amer: >60 mL/min (ref >60)
Glucose, Bld: 144 mg/dL — ABNORMAL HIGH (ref 70–99)
Potassium: 3.5 mmol/L (ref 3.5–5.1)
Sodium: 138 mmol/L (ref 135–145)
Total Bilirubin: 0.5 mg/dL (ref 0.3–1.2)
Total Protein: 6.4 g/dL — ABNORMAL LOW (ref 6.5–8.1)

## 2019-01-01 LAB — GLUCOSE, CAPILLARY
Glucose-Capillary: 126 mg/dL — ABNORMAL HIGH (ref 70–99)
Glucose-Capillary: 150 mg/dL — ABNORMAL HIGH (ref 70–99)
Glucose-Capillary: 128 mg/dL — ABNORMAL HIGH (ref 70–99)
Glucose-Capillary: 135 mg/dL — ABNORMAL HIGH (ref 70–99)

## 2019-01-01 LAB — BRAIN NATRIURETIC PEPTIDE: B Natriuretic Peptide: 108.5 pg/mL — ABNORMAL HIGH (ref 0.0–100.0)

## 2019-01-01 LAB — C-REACTIVE PROTEIN: CRP: 1 mg/dL — ABNORMAL HIGH (ref <1.0)

## 2019-01-01 LAB — D-DIMER, QUANTITATIVE: D-Dimer, Quant: 0.42 ug/mL-FEU (ref 0.00–0.50)

## 2019-01-01 LAB — HEMOGLOBIN A1C
Hgb A1c MFr Bld: 6.2 % — ABNORMAL HIGH (ref 4.8–5.6)
Mean Plasma Glucose: 131.24 mg/dL

## 2019-01-01 LAB — FERRITIN: Ferritin: 380 ng/mL — ABNORMAL HIGH (ref 11–307)

## 2019-01-01 MED ORDER — POTASSIUM CHLORIDE 2 MEQ/ML IV SOLN
INTRAVENOUS | Status: DC
  Administered 2019-01-01: 08:00:00 via INTRAVENOUS
  Filled 2019-01-01: qty 1000

## 2019-01-01 MED ORDER — POTASSIUM CHLORIDE CRYS ER 20 MEQ PO TBCR
40.0000 meq | EXTENDED_RELEASE_TABLET | Freq: Once | ORAL | Status: AC
  Administered 2019-01-01: 40 meq via ORAL
  Filled 2019-01-01: qty 2

## 2019-01-01 MED ORDER — PANTOPRAZOLE SODIUM 40 MG PO TBEC
40.0000 mg | DELAYED_RELEASE_TABLET | Freq: Every day | ORAL | Status: DC
  Administered 2019-01-01 – 2019-01-04 (×4): 40 mg via ORAL
  Filled 2019-01-01 (×4): qty 1

## 2019-01-01 MED ORDER — DULOXETINE HCL 60 MG PO CPEP
60.0000 mg | ORAL_CAPSULE | Freq: Two times a day (BID) | ORAL | Status: DC
  Administered 2019-01-01 – 2019-01-04 (×7): 60 mg via ORAL
  Filled 2019-01-01 (×10): qty 1

## 2019-01-01 MED ORDER — LORAZEPAM 2 MG/ML IJ SOLN
1.0000 mg | Freq: Once | INTRAMUSCULAR | Status: AC
  Administered 2019-01-01: 1 mg via INTRAVENOUS
  Filled 2019-01-01: qty 1

## 2019-01-01 MED ORDER — FUROSEMIDE 10 MG/ML IJ SOLN
20.0000 mg | Freq: Once | INTRAMUSCULAR | Status: AC
  Administered 2019-01-01: 11:00:00 20 mg via INTRAVENOUS
  Filled 2019-01-01: qty 2

## 2019-01-01 MED ORDER — CLONAZEPAM 1 MG PO TABS
1.0000 mg | ORAL_TABLET | Freq: Two times a day (BID) | ORAL | Status: DC
  Administered 2019-01-01 – 2019-01-04 (×7): 1 mg via ORAL
  Filled 2019-01-01 (×7): qty 1

## 2019-01-01 MED ORDER — NORTRIPTYLINE HCL 25 MG PO CAPS
25.0000 mg | ORAL_CAPSULE | Freq: Every day | ORAL | Status: DC
  Administered 2019-01-01 – 2019-01-03 (×3): 25 mg via ORAL
  Filled 2019-01-01 (×4): qty 1

## 2019-01-01 MED ORDER — GABAPENTIN 100 MG PO CAPS
200.0000 mg | ORAL_CAPSULE | Freq: Three times a day (TID) | ORAL | Status: DC
  Administered 2019-01-01 – 2019-01-04 (×11): 200 mg via ORAL
  Filled 2019-01-01 (×11): qty 2

## 2019-01-01 MED ORDER — ROPINIROLE HCL 1 MG PO TABS
3.0000 mg | ORAL_TABLET | Freq: Every day | ORAL | Status: DC
  Administered 2019-01-01 – 2019-01-03 (×3): 3 mg via ORAL
  Filled 2019-01-01 (×4): qty 3

## 2019-01-01 MED ORDER — HYDROCHLOROTHIAZIDE 25 MG PO TABS
25.0000 mg | ORAL_TABLET | Freq: Every day | ORAL | Status: DC
  Administered 2019-01-01 – 2019-01-04 (×4): 25 mg via ORAL
  Filled 2019-01-01 (×4): qty 1

## 2019-01-01 MED ORDER — METHYLPREDNISOLONE SODIUM SUCC 40 MG IJ SOLR
40.0000 mg | Freq: Every day | INTRAMUSCULAR | Status: DC
  Administered 2019-01-02 – 2019-01-03 (×2): 40 mg via INTRAVENOUS
  Filled 2019-01-01 (×3): qty 1

## 2019-01-01 NOTE — Evaluation (Signed)
Physical Therapy Evaluation Patient Details Name: Hailey Greene MRN: [id number] DOB: [birth year] Today's Date: 01/01/2019   History of Present Illness  Pt is a 68 y.o. female admitted 12/31/18 with COVID-19. PMH includes HTN, DM2, peripheral neuropathy, GERD.  Clinical Impression  Pt presents with an overall decrease in functional mobility secondary to above. PTA, pt ambulatory with rollator, lives with family who assist with ADL tasks as needed. Today, pt ambulatory with RW and min guard; limited by DOE with minimal activity. Pt would benefit from continued acute PT services to maximize functional mobility and independence prior to d/c home     Follow Up Recommendations Home health PT;Supervision for mobility/OOB(may progress to no PT)    Equipment Recommendations  None recommended by PT    Recommendations for Other Services       Precautions / Restrictions Precautions Precautions: Fall;Other (comment) Precaution Comments: Peripheral neuropathy; urinary frequency Restrictions Weight Bearing Restrictions: No      Mobility  Bed Mobility               General bed mobility comments: Received sitting in recliner  Transfers Overall transfer level: Needs assistance Equipment used: Rolling walker (2 wheeled) Transfers: Sit to/from Stand Sit to Stand: Supervision         General transfer comment: Stood from recliner and BSC to RW with supervision, reliant on BUE support to push into standing  Ambulation/Gait Ambulation/Gait assistance: Min guard Gait Distance (Feet): 24 Feet Assistive device: Rolling walker (2 wheeled) Gait Pattern/deviations: Step-through pattern;Decreased stride length;Trunk flexed Gait velocity: Decreased   General Gait Details: Slow gait with RW and min guard for balance due to 1x self-correct instability; pt declined further distance due to fatigue. DOE 3/4, unable to get reliable pulse ox read with ambulation. SpO2 >90% on RA upon return to  sitting  Stairs            Wheelchair Mobility    Modified Rankin (Stroke Patients Only)       Balance Overall balance assessment: Needs assistance   Sitting balance-Leahy Scale: Good Sitting balance - Comments: Assist to don socks sitting edge of recliner     Standing balance-Leahy Scale: Fair Standing balance comment: Can static stand without UE support; dynamic stability task improved with single UE support (posterior pericare)                             Pertinent Vitals/Pain Pain Assessment: No/denies pain    Home Living Family/patient expects to be discharged to:: Private residence Living Arrangements: Spouse/significant other;Children Available Help at Discharge: Family;Available 24 hours/day Type of Home: House Home Access: Stairs to enter Entrance Stairs-Rails: Right Entrance Stairs-Number of Steps: 2 Home Layout: One level Home Equipment: Walker - 2 wheels;Walker - 4 wheels;Tub bench;Hand held shower head      Prior Function Level of Independence: Needs assistance   Gait / Transfers Assistance Needed: "Furniture surfs" inside; uses RW or rollator for outdoor/community ambulation; limited by h/o bilateral foot neuropathy.  ADL's / Homemaking Assistance Needed: Reports husband assists as needed for bathing (uses tub bench and HH shower head with husband's assist) and toileting. Has been using Depends/diapers "since getting sick." Husband does 60 of household tasks. Difficulty reaching feet, wears slip-on shoes        Hand Dominance        Extremity/Trunk Assessment   Upper Extremity Assessment Upper Extremity Assessment: Generalized weakness(h/o peripheral neuropathy resulting in decreased fine motor control)  Lower Extremity Assessment Lower Extremity Assessment: Generalized weakness(h/o peripheral neuropathy, decreased light touch sensation in feet)    Cervical / Trunk Assessment Cervical / Trunk Assessment: Normal   Communication   Communication: No difficulties  Cognition Arousal/Alertness: Awake/alert Behavior During Therapy: WFL for tasks assessed/performed Overall Cognitive Status: Within Functional Limits for tasks assessed                                        General Comments General comments (skin integrity, edema, etc.): Pt initially stating she could not work with therapy today due to fatigue - education on disease process and max encouragement on importance of frequent mobility while admitted    Exercises     Assessment/Plan    PT Assessment Patient needs continued PT services  PT Problem List Decreased strength;Decreased activity tolerance;Decreased balance;Decreased mobility;Cardiopulmonary status limiting activity       PT Treatment Interventions Gait training;DME instruction;Stair training;Functional mobility training;Therapeutic activities;Therapeutic exercise;Balance training;Patient/family education    PT Goals (Current goals can be found in the Care Plan section)  Acute Rehab PT Goals Patient Stated Goal: Return home PT Goal Formulation: With patient Time For Goal Achievement: 01/15/19 Potential to Achieve Goals: Good    Frequency Min 3X/week   Barriers to discharge        Co-evaluation               AM-PAC PT "6 Clicks" Mobility  Outcome Measure Help needed turning from your back to your side while in a flat bed without using bedrails?: None Help needed moving from lying on your back to sitting on the side of a flat bed without using bedrails?: None Help needed moving to and from a bed to a chair (including a wheelchair)?: A Little Help needed standing up from a chair using your arms (e.g., wheelchair or bedside chair)?: A Little Help needed to walk in hospital room?: A Little Help needed climbing 3-5 steps with a railing? : A Little 6 Click Score: 20    End of Session   Activity Tolerance: Patient tolerated treatment well Patient  left: in chair;with call bell/phone within reach Nurse Communication: Mobility status PT Visit Diagnosis: Other abnormalities of gait and mobility (R26.89);Muscle weakness (generalized) (M62.81)    Time: 7209-4709 PT Time Calculation (min) (ACUTE ONLY): 31 min   Charges:   PT Evaluation $PT Eval Moderate Complexity: 1 Mod PT Treatments $Therapeutic Activity: 8-22 mins   Ina Homes, PT, DPT Acute Rehabilitation Services  Pager [phone] Office [phone]  Malachy Chamber 01/01/2019, 6:29 PM

## 2019-01-01 NOTE — Progress Notes (Signed)
PROGRESS NOTE                                                                                                                                                                                                             Patient Demographics:    Hailey Greene, is a 68 y.o. female, DOB - Oct 22, 1950, IOX:[id number]  Outpatient Primary MD for the patient is Cox, Elnita Maxwell, MD    LOS - 1  Admit date - 12/31/2018    Chief Complaint  Patient presents with   Shortness of Breath    COVID +        Brief Narrative -    Subjective:    Hailey Greene today has, No headache, No chest pain, No abdominal pain - No Nausea, No new weakness tingling or numbness, improved Cough & SOB.     Assessment  & Plan :     1. Acute Hypoxic Resp. Failure due to Acute Covid 19 Viral Pneumonitis during the ongoing 2020 Covid 19 Pandemic - mild disease, placed on steroid and rhabdo severe combination, already feeling better inflammatory markers stable.  Encouraged to sit up in chair in the daytime to use I-S and flutter valve for pulmonary toiletry.  Prone in bed at night.  Will try and titrate off oxygen.    COVID-19 Labs  Recent Labs    12/31/18 1028 12/31/18 1609 01/01/19 0215  DDIMER 0.43  --  0.42  FERRITIN  --  421* 380*  LDH  --  152  --   CRP  --  0.8 1.0*    Lab Results  Component Value Date   SARSCOV2NAA POSITIVE (A) 12/31/2018     Hepatic Function Latest Ref Rng & Units 01/01/2019 12/31/2018  Total Protein 6.5 - 8.1 g/dL 6.4(L) 7.4  Albumin 3.5 - 5.0 g/dL 3.4(L) 4.1  AST 15 - 41 U/L 19 22  ALT 0 - 44 U/L 22 26  Alk Phosphatase 38 - 126 U/L 81 89  Total Bilirubin 0.3 - 1.2 mg/dL 0.5 0.8        Component Value Date/Time   BNP 108.5 (H) 01/01/2019 0215      2.  Prolonged QTC.  Due to hypokalemia.  Resolved.  3.  GERD.  Added PPI.  4.  Diabetic peripheral neuropathy.  On Cymbalta and Neurontin continue.  5.  Mild  leukopenia.  Likely viral infection related.  Monitor.    6.  Hypertension.  On HCTZ resume from tomorrow.  Will give low-dose Lasix x1 on 01/01/2019 as she has mild peripheral edema.  Stop any IV fluids.  7. DM type II.  Glucophage held here on sliding scale.  Will check A1c for outpatient control.  Lab Results  Component Value Date   HGBA1C 5.9 05/26/2014   CBG (last 3)  Recent Labs    12/31/18 1804 01/01/19 0749  GLUCAP 121* 126*         Condition - Fair  Family Communication  :  None  Code Status :  Full  Diet :   Diet Order            Diet Heart Room service appropriate? Yes; Fluid consistency: Thin  Diet effective now               Disposition Plan  : Home on 01/05/2019 after she finishes Remdisvir course.  Consults  :  None  Procedures  :    PUD Prophylaxis : PPI  DVT Prophylaxis  :  Lovenox    Lab Results  Component Value Date   PLT 226 01/01/2019    Inpatient Medications  Scheduled Meds:  enoxaparin (LOVENOX) injection  50 mg Subcutaneous Q24H   insulin aspart  0-9 Units Subcutaneous TID WC   [START ON 01/02/2019] methylPREDNISolone (SOLU-MEDROL) injection  40 mg Intravenous Daily   sodium chloride flush  3 mL Intravenous Q12H   vitamin C  500 mg Oral Daily   zinc sulfate  220 mg Oral Daily   Continuous Infusions:  lactated ringers with kcl 50 mL/hr at 01/01/19 2094   remdesivir 100 mg in NS 250 mL     PRN Meds:.acetaminophen, chlorpheniramine-HYDROcodone, guaiFENesin-dextromethorphan, ondansetron (ZOFRAN) IV  Antibiotics  :    Anti-infectives (From admission, onward)   Start     Dose/Rate Route Frequency Ordered Stop   01/01/19 1600  remdesivir 100 mg in sodium chloride 0.9 % 250 mL IVPB     100 mg 500 mL/hr over 30 Minutes Intravenous Every 24 hours 12/31/18 1716 01/05/19 1559   12/31/18 1830  remdesivir 200 mg in sodium chloride 0.9 % 250 mL IVPB     200 mg 500 mL/hr over 30 Minutes Intravenous Once 12/31/18 1716  12/31/18 1900       Time Spent in minutes  30   Hailey Greene M.D on 01/01/2019 at 10:36 AM  To page go to [email or web address] - password [email or web address]  [facility] -  Office  [phone]    See all Orders from today for further details    Objective:   Vitals:   12/31/18 1736 12/31/18 2016 01/01/19 0400 01/01/19 0800  BP: (!) 146/71 (!) 151/73 (!) 144/76   Pulse: 76 86 84   Resp: '18 20  18  ' Temp: 97.9 F (36.6 C) 99 F (37.2 C) 98.8 F (37.1 C) 97.8 F (36.6 C)  TempSrc: Oral Oral Oral Axillary  SpO2: 93% 91% 97%   Weight:      Height:        Wt Readings from Last 3 Encounters:  12/31/18 106.6 kg  04/11/14 92.6 kg  02/05/14 89.1 kg     Intake/Output Summary (Last 24 hours) at 01/01/2019 1036 Last data filed at 12/31/2018 2300 Gross per 24 hour  Intake 400 ml  Output --  Net 400 ml     Physical Exam  Awake Alert, Oriented X 3, No new  F.N deficits, Normal affect [place].AT,PERRAL Supple Neck,No JVD, No cervical lymphadenopathy appriciated.  Symmetrical Chest wall movement, Good air movement bilaterally, CTAB RRR,No Gallops,Rubs or new Murmurs, No Parasternal Heave +ve B.Sounds, Abd Soft, No tenderness, No organomegaly appriciated, No rebound - guarding or rigidity. No Cyanosis, Clubbing or edema, No new Rash or bruise       Data Review:    CBC Recent Labs  Lab 12/31/18 1028 01/01/19 0215  WBC 5.4 2.5*  HGB 14.6 13.3  HCT 43.6 40.7  PLT 231 226  MCV 84.3 85.0  MCH 28.2 27.8  MCHC 33.5 32.7  RDW 14.9 15.1  LYMPHSABS 0.5* 0.4*  MONOABS 0.5 0.1  EOSABS 0.0 0.0  BASOSABS 0.0 0.0    Chemistries  Recent Labs  Lab 12/31/18 1028 01/01/19 0215  NA 134* 138  K 3.2* 3.5  CL 98 103  CO2 24 22  GLUCOSE 155* 144*  BUN 18 16  CREATININE 0.78 0.65  CALCIUM 8.9 8.4*  MG  --  2.0  AST 22 19  ALT 26 22  ALKPHOS 89 81  BILITOT 0.8 0.5    ------------------------------------------------------------------------------------------------------------------ No results for input(s): CHOL, HDL, LDLCALC, TRIG, CHOLHDL, LDLDIRECT in the last 72 hours.  Lab Results  Component Value Date   HGBA1C 5.9 05/26/2014   ------------------------------------------------------------------------------------------------------------------ No results for input(s): TSH, T4TOTAL, T3FREE, THYROIDAB in the last 72 hours.  Invalid input(s): FREET3  Cardiac Enzymes No results for input(s): CKMB, TROPONINI, MYOGLOBIN in the last 168 hours.  Invalid input(s): CK ------------------------------------------------------------------------------------------------------------------    Component Value Date/Time   BNP 108.5 (H) 01/01/2019 0215    Micro Results Recent Results (from the past 240 hour(s))  Blood culture (routine x 2)     Status: None (Preliminary result)   Collection Time: 12/31/18 10:40 AM   Specimen: BLOOD  Result Value Ref Range Status   Specimen Description   Final    BLOOD RIGHT ANTECUBITAL Performed at [facility], [place] [address], [place], [place] [address]    Special Requests   Final    BOTTLES DRAWN AEROBIC AND ANAEROBIC Blood Culture adequate volume Performed at [facility] [address], [place], [place] [address]    Culture   Final    NO GROWTH <12 HOURS Performed at [facility] [address], [place], [place] [address]    Report Status PENDING  Incomplete  SARS Coronavirus 2 [facility] order, Performed in [facility] hospital lab) Nasopharyngeal Nasopharyngeal Swab     Status: Abnormal   Collection Time: 12/31/18 10:46 AM   Specimen: Nasopharyngeal Swab  Result Value Ref Range Status   SARS Coronavirus 2 POSITIVE (A) NEGATIVE Final    Comment: RESULT CALLED TO, READ BACK BY AND VERIFIED WITH:  MADDIE 12/31/18 1151 JM (NOTE) If result is NEGATIVE SARS-CoV-2 target nucleic acids  are NOT DETECTED. The SARS-CoV-2 RNA is generally detectable in upper and lower  respiratory specimens during the acute phase of infection. The lowest  concentration of SARS-CoV-2 viral copies this assay can detect is 250  copies / mL. A negative result does not preclude SARS-CoV-2 infection  and should not be used as the sole basis for treatment or other  patient management decisions.  A negative result may occur with  improper specimen collection / handling, submission of specimen other  than nasopharyngeal swab, presence of viral mutation(s) within the  areas targeted by this assay, and inadequate number of viral copies  (<250 copies / mL). A negative result  must be combined with clinical  observations, patient history, and epidemiological information. If result is POSITIVE SARS-CoV-2 target nucleic acids are DETECTED. The SARS-CoV- 2 RNA is generally detectable in upper and lower  respiratory specimens during the acute phase of infection.  Positive  results are indicative of active infection with SARS-CoV-2.  Clinical  correlation with patient history and other diagnostic information is  necessary to determine patient infection status.  Positive results do  not rule out bacterial infection or co-infection with other viruses. If result is PRESUMPTIVE POSTIVE SARS-CoV-2 nucleic acids MAY BE PRESENT.   A presumptive positive result was obtained on the submitted specimen  and confirmed on repeat testing.  While 2019 novel coronavirus  (SARS-CoV-2) nucleic acids may be present in the submitted sample  additional confirmatory testing may be necessary for epidemiological  and / or clinical management purposes  to differentiate between  SARS-CoV-2 and other Sarbecovirus currently known to infect humans.  If clinically indicated additional testing with an alternate test  methodology [phone]) is advised. Th e SARS-CoV-2 RNA is generally  detectable in upper and lower respiratory specimens  during the acute  phase of infection. The expected result is Negative. Fact Sheet for Patients:  StrictlyIdeas.no Fact Sheet for Healthcare Providers: BankingDealers.co.za This test is not yet approved or cleared by the [place] FDA and has been authorized for detection and/or diagnosis of SARS-CoV-2 by FDA under an Emergency Use Authorization (EUA).  This EUA will remain in effect (meaning this test can be used) for the duration of the COVID-19 declaration under Section 564(b)(1) of the Act, 21 U.S.C. section 360bbb-3(b)(1), unless the authorization is terminated or revoked sooner. Performed at [facility], [place] [address] [place], [place] [address]   Blood culture (routine x 2)     Status: None (Preliminary result)   Collection Time: 12/31/18 10:50 AM   Specimen: BLOOD LEFT HAND  Result Value Ref Range Status   Specimen Description   Final    BLOOD LEFT HAND Performed at [facility] [address], [place], [place] [address]    Special Requests   Final    BOTTLES DRAWN AEROBIC AND ANAEROBIC Blood Culture adequate volume Performed at [facility] [address] [place], [place] [address]    Culture   Final    NO GROWTH <12 HOURS Performed at [facility] [address] [place], [place] [address]    Report Status PENDING  Incomplete    Radiology Reports Dg Chest Portable 1 View  Result Date: 12/31/2018 CLINICAL DATA:  [place] positive, shortness of breath. EXAM: PORTABLE CHEST 1 VIEW COMPARISON:  12/30/2018 FINDINGS: Heart size mediastinal contours are stable. Persistent left basilar and peripheral opacities are noted. No signs of dense consolidation or pleural effusion. No acute bone finding. IMPRESSION: Vague airspace opacities in the left chest, could be seen in setting of viral pneumonia. Consider follow-up to ensure resolution. Electronically Signed   By:  Zetta Bills M.D.   On: 12/31/2018 11:30

## 2019-01-01 NOTE — Plan of Care (Signed)
Will continue with plan of care. 

## 2019-01-01 NOTE — TOC Initial Note (Signed)
Transition of Care [facility]) - Initial/Assessment Note    Patient Details  Name: Hailey Greene MRN: [id number] Date of Birth: 02-17-1951  [facility] of [facility]) CM/SW Contact:    Ninfa Meeker, RN Phone Number: [phone] (working remotely) 01/01/2019, 1:27 PM  Clinical Narrative:   68 yr old female admitted for COVID 19 treatment. Patient lives home with husband. On Remdesivir, IV steroids, supplemental oxygen as needed. Per MD note patient may discharge home on 01/05/19 after last dose of Remdesivir. Case manager will continue to monitor for needs as patient medically improves, may she be blessed to do so.                        Patient Goals and CMS Choice        Expected Discharge Plan and Services                                                Prior Living Arrangements/Services                       Activities of Daily Living Home Assistive Devices/Equipment: Cane (specify quad or straight), Eyeglasses, Walker (specify type)(2-4 wheeled walkers, single point cane) ADL Screening (condition at time of admission) Patient's cognitive ability adequate to safely complete daily activities?: Yes Is the patient deaf or have difficulty hearing?: No Does the patient have difficulty seeing, even when wearing glasses/contacts?: No Does the patient have difficulty concentrating, remembering, or making decisions?: No Patient able to express need for assistance with ADLs?: No Does the patient have difficulty dressing or bathing?: No Independently performs ADLs?: Yes (appropriate for developmental age) Communication: Independent Dressing (OT): Independent Grooming: Independent Feeding: Independent Bathing: Independent Toileting: Independent In/Out Bed: Independent Walks in Home: Independent Does the patient have difficulty walking or climbing stairs?: Yes(secondary to weakness) Weakness of Legs: Both Weakness of Arms/Hands: None  Permission  Sought/Granted                  Emotional Assessment              Admission diagnosis:  Acute respiratory failure with hypoxia ([place]) [J96.01] Diarrhea, unspecified type [R19.7] COVID-19 [U07.1] Patient Active Problem List   Diagnosis Date Noted  . 2019 novel coronavirus disease (COVID-19) 12/31/2018  . Benign essential HTN 12/31/2018  . Prolonged QT interval 12/31/2018  . Type II diabetes mellitus, uncontrolled ([place]) 05/26/2014  . Sensory neuropathy 04/11/2014  . Muscle spasm 10/12/2013  . Inability to walk 10/12/2013  . Areflexia 10/12/2013   PCP:  Rochel Brome, MD Pharmacy:   [facility], [place] - [name] [address] Phone: [phone] Fax: [phone]     Social Determinants of Health (SDOH) Interventions    Readmission Risk Interventions No flowsheet data found.

## 2019-01-02 DIAGNOSIS — I10 Essential (primary) hypertension: Secondary | ICD-10-CM

## 2019-01-02 LAB — COMPREHENSIVE METABOLIC PANEL
ALT: 20 U/L (ref 0–44)
AST: 17 U/L (ref 15–41)
Albumin: 3.5 g/dL (ref 3.5–5.0)
Alkaline Phosphatase: 69 U/L (ref 38–126)
Anion gap: 15 (ref 5–15)
BUN: 25 mg/dL — ABNORMAL HIGH (ref 8–23)
CO2: 24 mmol/L (ref 22–32)
Calcium: 8.8 mg/dL — ABNORMAL LOW (ref 8.9–10.3)
Chloride: 99 mmol/L (ref 98–111)
Creatinine, Ser: 0.78 mg/dL (ref 0.44–1.00)
GFR calc Af Amer: >60 mL/min (ref >60)
GFR calc non Af Amer: >60 mL/min (ref >60)
Glucose, Bld: 98 mg/dL (ref 70–99)
Potassium: 3.5 mmol/L (ref 3.5–5.1)
Sodium: 138 mmol/L (ref 135–145)
Total Bilirubin: 0.4 mg/dL (ref 0.3–1.2)
Total Protein: 6.4 g/dL — ABNORMAL LOW (ref 6.5–8.1)

## 2019-01-02 LAB — CBC WITH DIFFERENTIAL/PLATELET
Abs Immature Granulocytes: 0.08 10*3/uL — ABNORMAL HIGH (ref 0.00–0.07)
Basophils Absolute: 0 10*3/uL (ref 0.0–0.1)
Basophils Relative: 0 %
Eosinophils Absolute: 0 10*3/uL (ref 0.0–0.5)
Eosinophils Relative: 0 %
HCT: 41.6 % (ref 36.0–46.0)
Hemoglobin: 13.4 g/dL (ref 12.0–15.0)
Immature Granulocytes: 1 %
Lymphocytes Relative: 13 %
Lymphs Abs: 1.2 10*3/uL (ref 0.7–4.0)
MCH: 27.8 pg (ref 26.0–34.0)
MCHC: 32.2 g/dL (ref 30.0–36.0)
MCV: 86.3 fL (ref 80.0–100.0)
Monocytes Absolute: 0.8 10*3/uL (ref 0.1–1.0)
Monocytes Relative: 9 %
Neutro Abs: 7.4 10*3/uL (ref 1.7–7.7)
Neutrophils Relative %: 77 %
Platelets: 255 10*3/uL (ref 150–400)
RBC: 4.82 MIL/uL (ref 3.87–5.11)
RDW: 15.5 % (ref 11.5–15.5)
WBC: 9.6 10*3/uL (ref 4.0–10.5)
nRBC: 0 % (ref 0.0–0.2)

## 2019-01-02 LAB — URINALYSIS, ROUTINE W REFLEX MICROSCOPIC
Bilirubin Urine: NEGATIVE
Glucose, UA: NEGATIVE mg/dL
Hgb urine dipstick: NEGATIVE
Ketones, ur: NEGATIVE mg/dL
Leukocytes,Ua: NEGATIVE
Nitrite: NEGATIVE
Protein, ur: NEGATIVE mg/dL
Specific Gravity, Urine: 1.02 (ref 1.005–1.030)
pH: 6 (ref 5.0–8.0)

## 2019-01-02 LAB — FERRITIN: Ferritin: 353 ng/mL — ABNORMAL HIGH (ref 11–307)

## 2019-01-02 LAB — BRAIN NATRIURETIC PEPTIDE: B Natriuretic Peptide: 62.9 pg/mL (ref 0.0–100.0)

## 2019-01-02 LAB — MAGNESIUM: Magnesium: 2 mg/dL (ref 1.7–2.4)

## 2019-01-02 LAB — GLUCOSE, CAPILLARY
Glucose-Capillary: 114 mg/dL — ABNORMAL HIGH (ref 70–99)
Glucose-Capillary: 102 mg/dL — ABNORMAL HIGH (ref 70–99)
Glucose-Capillary: 120 mg/dL — ABNORMAL HIGH (ref 70–99)
Glucose-Capillary: 164 mg/dL — ABNORMAL HIGH (ref 70–99)
Glucose-Capillary: 152 mg/dL — ABNORMAL HIGH (ref 70–99)

## 2019-01-02 LAB — D-DIMER, QUANTITATIVE: D-Dimer, Quant: 0.53 ug/mL-FEU — ABNORMAL HIGH (ref 0.00–0.50)

## 2019-01-02 LAB — C-REACTIVE PROTEIN: CRP: <0.8 mg/dL (ref <1.0)

## 2019-01-02 NOTE — Progress Notes (Signed)
PROGRESS NOTE                                                                                                                                                                                                             Patient Demographics:    Hailey Greene, is a 68 y.o. female, DOB - 1950-06-30, ZOX:[id number]  Outpatient Primary MD for the patient is Cox, Fritzi MandesKirsten, MD   Admit date - 12/31/2018   LOS - 2  Chief Complaint  Patient presents with  . Shortness of Breath    COVID +        Brief Narrative: Patient is a 68 y.o. female with PMHx of HTN, DM-2-diagnosed with COVID-19 on 10/3-presented with worsening shortness of breath, failure to thrive symptoms-she was found to have acute hypoxic respiratory failure secondary to COVID-19 pneumonia and admitted to the hospitalist service.  See below for further details.   Subjective:    Hailey DockerKathleen Cromie today feels much better-she is on room air.  No nausea or vomiting.   Assessment  & Plan :   Acute Hypoxic Resp Failure due to Covid 19 Viral pneumonia: Improved-on room air.  Continue steroids and Remdesivir.  Fever: afebrile/  O2 requirements: On room air  COVID-19 Labs: Recent Labs    12/31/18 1028 12/31/18 1609 01/01/19 0215 01/02/19 0207  DDIMER 0.43  --  0.42 0.53*  FERRITIN  --  421* 380* 353*  LDH  --  152  --   --   CRP  --  0.8 1.0* <0.8    Lab Results  Component Value Date   SARSCOV2NAA POSITIVE (A) 12/31/2018     COVID-19 Medications: Steroids: 10/5>> Remdesivir: 10/5>> Actemra: Not indicated Convalescent Plasma: Not indicated Research Studies:N/A  Other medications: Diuretics:Euvolemic-no need for lasix Antibiotics:Not needed as no evidence of bacterial infection  Prone/Incentive Spirometry:  encourage incentive spirometry use 3-4/hour.  DVT Prophylaxis  :  Lovenox  Prolonged QTC: Secondary to hypokalemia-resolved  GERD: Continue PPI   Diabetic peripheral neuropathy: Continue Cymbalta/Neurontin-appears stable  HTN: Controlled-continue HCTZ  DM-2: CBG stable with SSI  CBG (last 3)  Recent Labs    01/01/19 2107 01/02/19 0718 01/02/19 1119  GLUCAP 135* 102* 120*    Obesity: Estimated body mass index is 41.63 kg/m as calculated from the following:   Height as of this encounter: 5\' 3"  (  1.6 m).   Weight as of this encounter: 106.6 kg.   ABG: No results found for: PHART, PCO2ART, PO2ART, HCO3, TCO2, ACIDBASEDEF, O2SAT  Vent Settings: N/A  Condition - Stable  Family Communication  :  Spouse updated over the phone  Code Status :  Full Code  Diet :  Diet Order            Diet Heart Room service appropriate? Yes; Fluid consistency: Thin  Diet effective now               Disposition Plan  :  Remain hospitalized-  Barriers to discharge: IV Remdesivir x5 days  Consults  :  None  Procedures  :  None  Antibiotics  :    Anti-infectives (From admission, onward)   Start     Dose/Rate Route Frequency Ordered Stop   01/01/19 1600  remdesivir 100 mg in sodium chloride 0.9 % 250 mL IVPB     100 mg 500 mL/hr over 30 Minutes Intravenous Every 24 hours 12/31/18 1716 01/05/19 1559   12/31/18 1830  remdesivir 200 mg in sodium chloride 0.9 % 250 mL IVPB     200 mg 500 mL/hr over 30 Minutes Intravenous Once 12/31/18 1716 12/31/18 1900      Inpatient Medications  Scheduled Meds: . clonazePAM  1 mg Oral BID  . DULoxetine  60 mg Oral BID  . enoxaparin (LOVENOX) injection  50 mg Subcutaneous Q24H  . gabapentin  200 mg Oral TID  . hydrochlorothiazide  25 mg Oral Daily  . insulin aspart  0-9 Units Subcutaneous TID WC  . methylPREDNISolone (SOLU-MEDROL) injection  40 mg Intravenous Daily  . nortriptyline  25 mg Oral QHS  . pantoprazole  40 mg Oral Daily  . rOPINIRole  3 mg Oral QHS  . sodium chloride flush  3 mL Intravenous Q12H  . vitamin C  500 mg Oral Daily  . zinc sulfate  220 mg Oral Daily    Continuous Infusions: . remdesivir 100 mg in NS 250 mL Stopped (01/01/19 1632)   PRN Meds:.acetaminophen, chlorpheniramine-HYDROcodone, guaiFENesin-dextromethorphan, ondansetron (ZOFRAN) IV   Time Spent in minutes  25  See all Orders from today for further details   Oren Binet M.D on 01/02/2019 at 2:33 PM  To page go to [email or web address] - use universal password  [facility] -  Office  [phone]    Objective:   Vitals:   01/01/19 1625 01/01/19 2000 01/02/19 0442 01/02/19 0745  BP: 116/65 (!) 137/54 128/73 124/68  Pulse: 82 81 91 83  Resp: 18 18 20 18   Temp: 98.1 F (36.7 C) 99.8 F (37.7 C) 98.1 F (36.7 C) (!) 97.5 F (36.4 C)  TempSrc: Oral Oral Oral Oral  SpO2: 92% 94% 92% 90%  Weight:      Height:        Wt Readings from Last 3 Encounters:  12/31/18 106.6 kg  04/11/14 92.6 kg  02/05/14 89.1 kg     Intake/Output Summary (Last 24 hours) at 01/02/2019 1433 Last data filed at 01/01/2019 2000 Gross per 24 hour  Intake 640 ml  Output -  Net 640 ml     Physical Exam Gen Exam:Alert awake-not in any distress HEENT:atraumatic, normocephalic Chest: B/L clear to auscultation anteriorly CVS:S1S2 regular Abdomen:soft non tender, non distended Extremities:no edema Neurology: Non focal Skin: no rash   Data Review:    CBC Recent Labs  Lab 12/31/18 1028 01/01/19 0215 01/02/19 0207  WBC 5.4 2.5* 9.6  HGB 14.6  13.3 13.4  HCT 43.6 40.7 41.6  PLT 231 226 255  MCV 84.3 85.0 86.3  MCH 28.2 27.8 27.8  MCHC 33.5 32.7 32.2  RDW 14.9 15.1 15.5  LYMPHSABS 0.5* 0.4* 1.2  MONOABS 0.5 0.1 0.8  EOSABS 0.0 0.0 0.0  BASOSABS 0.0 0.0 0.0    Chemistries  Recent Labs  Lab 12/31/18 1028 01/01/19 0215 01/02/19 0207  NA 134* 138 138  K 3.2* 3.5 3.5  CL 98 103 99  CO2 24 22 24   GLUCOSE 155* 144* 98  BUN 18 16 25*  CREATININE 0.78 0.65 0.78  CALCIUM 8.9 8.4* 8.8*  MG  --  2.0 2.0  AST 22 19 17   ALT 26 22 20   ALKPHOS 89 81 69  BILITOT 0.8 0.5 0.4    ------------------------------------------------------------------------------------------------------------------ No results for input(s): CHOL, HDL, LDLCALC, TRIG, CHOLHDL, LDLDIRECT in the last 72 hours.  Lab Results  Component Value Date   HGBA1C 6.2 (H) 01/01/2019   ------------------------------------------------------------------------------------------------------------------ No results for input(s): TSH, T4TOTAL, T3FREE, THYROIDAB in the last 72 hours.  Invalid input(s): FREET3 ------------------------------------------------------------------------------------------------------------------ Recent Labs    01/01/19 0215 01/02/19 0207  FERRITIN 380* 353*    Coagulation profile No results for input(s): INR, PROTIME in the last 168 hours.  Recent Labs    01/01/19 0215 01/02/19 0207  DDIMER 0.42 0.53*    Cardiac Enzymes No results for input(s): CKMB, TROPONINI, MYOGLOBIN in the last 168 hours.  Invalid input(s): CK ------------------------------------------------------------------------------------------------------------------    Component Value Date/Time   BNP 62.9 01/02/2019 0207    Micro Results Recent Results (from the past 240 hour(s))  Blood culture (routine x 2)     Status: None (Preliminary result)   Collection Time: 12/31/18 10:40 AM   Specimen: BLOOD  Result Value Ref Range Status   Specimen Description   Final    BLOOD RIGHT ANTECUBITAL Performed at [facility], [address], [place], [facility] [address]    Special Requests   Final    BOTTLES DRAWN AEROBIC AND ANAEROBIC Blood Culture adequate volume Performed at [facility], [address] [place], [place]    Culture   Final    NO GROWTH 2 DAYS Performed at [facility], [address] [place], [facility] [address]    Report Status PENDING  Incomplete  SARS Coronavirus 2 [facility] order, Performed in [facility] hospital lab) Nasopharyngeal  Nasopharyngeal Swab     Status: Abnormal   Collection Time: 12/31/18 10:46 AM   Specimen: Nasopharyngeal Swab  Result Value Ref Range Status   SARS Coronavirus 2 POSITIVE (A) NEGATIVE Final    Comment: RESULT CALLED TO, READ BACK BY AND VERIFIED WITH:  MADDIE 12/31/18 1151 JM (NOTE) If result is NEGATIVE SARS-CoV-2 target nucleic acids are NOT DETECTED. The SARS-CoV-2 RNA is generally detectable in upper and lower  respiratory specimens during the acute phase of infection. The lowest  concentration of SARS-CoV-2 viral copies this assay can detect is 250  copies / mL. A negative result does not preclude SARS-CoV-2 infection  and should not be used as the sole basis for treatment or other  patient management decisions.  A negative result may occur with  improper specimen collection / handling, submission of specimen other  than nasopharyngeal swab, presence of viral mutation(s) within the  areas targeted by this assay, and inadequate number of viral copies  (<250 copies / mL). A negative result must be combined with clinical  observations, patient history, and  epidemiological information. If result is POSITIVE SARS-CoV-2 target nucleic acids are DETECTED. The SARS-CoV- 2 RNA is generally detectable in upper and lower  respiratory specimens during the acute phase of infection.  Positive  results are indicative of active infection with SARS-CoV-2.  Clinical  correlation with patient history and other diagnostic information is  necessary to determine patient infection status.  Positive results do  not rule out bacterial infection or co-infection with other viruses. If result is PRESUMPTIVE POSTIVE SARS-CoV-2 nucleic acids MAY BE PRESENT.   A presumptive positive result was obtained on the submitted specimen  and confirmed on repeat testing.  While 2019 novel coronavirus  (SARS-CoV-2) nucleic acids may be present in the submitted sample  additional confirmatory testing may be necessary  for epidemiological  and / or clinical management purposes  to differentiate between  SARS-CoV-2 and other Sarbecovirus currently known to infect humans.  If clinically indicated additional testing with an alternate test  methodology [phone]) is advised. Th e SARS-CoV-2 RNA is generally  detectable in upper and lower respiratory specimens during the acute  phase of infection. The expected result is Negative. Fact Sheet for Patients:  BoilerBrush.com.cy Fact Sheet for Healthcare Providers: https://pope.com/ This test is not yet approved or cleared by the [place] FDA and has been authorized for detection and/or diagnosis of SARS-CoV-2 by FDA under an Emergency Use Authorization (EUA).  This EUA will remain in effect (meaning this test can be used) for the duration of the COVID-19 declaration under Section 564(b)(1) of the Act, 21 U.S.C. section 360bbb-3(b)(1), unless the authorization is terminated or revoked sooner. Performed at [facility], [address], [place], [place] [address]   Blood culture (routine x 2)     Status: None (Preliminary result)   Collection Time: 12/31/18 10:50 AM   Specimen: BLOOD LEFT HAND  Result Value Ref Range Status   Specimen Description   Final    BLOOD LEFT HAND Performed at [facility], [address] [place], [place] [address]    Special Requests   Final    BOTTLES DRAWN AEROBIC AND ANAEROBIC Blood Culture adequate volume Performed at [facility], [address] [place], [place] [address]    Culture   Final    NO GROWTH 2 DAYS Performed at [facility], [address] [place], [place] [address]    Report Status PENDING  Incomplete    Radiology Reports Dg Chest Portable 1 View  Result Date: 12/31/2018 CLINICAL DATA:  C[place]an positive, shortness of breath. EXAM: PORTABLE CHEST 1 VIEW COMPARISON:  12/30/2018 FINDINGS: Heart  size mediastinal contours are stable. Persistent left basilar and peripheral opacities are noted. No signs of dense consolidation or pleural effusion. No acute bone finding. IMPRESSION: Vague airspace opacities in the left chest, could be seen in setting of viral pneumonia. Consider follow-up to ensure resolution. Electronically Signed   By: Donzetta Kohut M.D.   On: 12/31/2018 11:30

## 2019-01-02 NOTE — Plan of Care (Signed)
Will continue with plan of care. 

## 2019-01-03 LAB — COMPREHENSIVE METABOLIC PANEL
ALT: 21 U/L (ref 0–44)
AST: 18 U/L (ref 15–41)
Albumin: 3.4 g/dL — ABNORMAL LOW (ref 3.5–5.0)
Alkaline Phosphatase: 71 U/L (ref 38–126)
Anion gap: 13 (ref 5–15)
BUN: 25 mg/dL — ABNORMAL HIGH (ref 8–23)
CO2: 26 mmol/L (ref 22–32)
Calcium: 8.5 mg/dL — ABNORMAL LOW (ref 8.9–10.3)
Chloride: 94 mmol/L — ABNORMAL LOW (ref 98–111)
Creatinine, Ser: 0.81 mg/dL (ref 0.44–1.00)
GFR calc Af Amer: >60 mL/min (ref >60)
GFR calc non Af Amer: >60 mL/min (ref >60)
Glucose, Bld: 96 mg/dL (ref 70–99)
Potassium: 3.4 mmol/L — ABNORMAL LOW (ref 3.5–5.1)
Sodium: 133 mmol/L — ABNORMAL LOW (ref 135–145)
Total Bilirubin: 0.8 mg/dL (ref 0.3–1.2)
Total Protein: 6.4 g/dL — ABNORMAL LOW (ref 6.5–8.1)

## 2019-01-03 LAB — CBC WITH DIFFERENTIAL/PLATELET
Abs Immature Granulocytes: 0.02 10*3/uL (ref 0.00–0.07)
Basophils Absolute: 0 10*3/uL (ref 0.0–0.1)
Basophils Relative: 0 %
Eosinophils Absolute: 0 10*3/uL (ref 0.0–0.5)
Eosinophils Relative: 0 %
HCT: 41.6 % (ref 36.0–46.0)
Hemoglobin: 13.6 g/dL (ref 12.0–15.0)
Immature Granulocytes: 0 %
Lymphocytes Relative: 33 %
Lymphs Abs: 1.5 10*3/uL (ref 0.7–4.0)
MCH: 27.9 pg (ref 26.0–34.0)
MCHC: 32.7 g/dL (ref 30.0–36.0)
MCV: 85.4 fL (ref 80.0–100.0)
Monocytes Absolute: 0.6 10*3/uL (ref 0.1–1.0)
Monocytes Relative: 13 %
Neutro Abs: 2.4 10*3/uL (ref 1.7–7.7)
Neutrophils Relative %: 54 %
Platelets: 262 10*3/uL (ref 150–400)
RBC: 4.87 MIL/uL (ref 3.87–5.11)
RDW: 15 % (ref 11.5–15.5)
WBC: 4.6 10*3/uL (ref 4.0–10.5)
nRBC: 0 % (ref 0.0–0.2)

## 2019-01-03 LAB — C-REACTIVE PROTEIN: CRP: 6.5 mg/dL — ABNORMAL HIGH (ref <1.0)

## 2019-01-03 LAB — GLUCOSE, CAPILLARY
Glucose-Capillary: 84 mg/dL (ref 70–99)
Glucose-Capillary: 156 mg/dL — ABNORMAL HIGH (ref 70–99)
Glucose-Capillary: 197 mg/dL — ABNORMAL HIGH (ref 70–99)

## 2019-01-03 LAB — BRAIN NATRIURETIC PEPTIDE: B Natriuretic Peptide: 29.1 pg/mL (ref 0.0–100.0)

## 2019-01-03 LAB — D-DIMER, QUANTITATIVE: D-Dimer, Quant: 0.39 ug/mL-FEU (ref 0.00–0.50)

## 2019-01-03 LAB — FERRITIN: Ferritin: 326 ng/mL — ABNORMAL HIGH (ref 11–307)

## 2019-01-03 LAB — MAGNESIUM: Magnesium: 1.9 mg/dL (ref 1.7–2.4)

## 2019-01-03 MED ORDER — METHYLPREDNISOLONE SODIUM SUCC 40 MG IJ SOLR
40.0000 mg | Freq: Two times a day (BID) | INTRAMUSCULAR | Status: DC
  Administered 2019-01-03 – 2019-01-04 (×2): 40 mg via INTRAVENOUS
  Filled 2019-01-03 (×2): qty 1

## 2019-01-03 MED ORDER — POTASSIUM CHLORIDE CRYS ER 20 MEQ PO TBCR
40.0000 meq | EXTENDED_RELEASE_TABLET | Freq: Once | ORAL | Status: AC
  Administered 2019-01-03: 10:00:00 40 meq via ORAL
  Filled 2019-01-03: qty 2

## 2019-01-03 MED ORDER — MAGNESIUM SULFATE 2 GM/50ML IV SOLN
2.0000 g | Freq: Once | INTRAVENOUS | Status: AC
  Administered 2019-01-03: 13:00:00 2 g via INTRAVENOUS
  Filled 2019-01-03: qty 50

## 2019-01-03 MED ORDER — POTASSIUM CHLORIDE CRYS ER 20 MEQ PO TBCR
40.0000 meq | EXTENDED_RELEASE_TABLET | Freq: Four times a day (QID) | ORAL | Status: AC
  Administered 2019-01-03 (×2): 40 meq via ORAL
  Filled 2019-01-03 (×2): qty 2

## 2019-01-03 NOTE — Progress Notes (Signed)
Physical Therapy Treatment Patient Details Name: Hailey Greene MRN: [id number] DOB: [birth year] Today's Date: 01/03/2019    History of Present Illness Pt is a 68 y.o. female admitted 12/31/18 with COVID-19. PMH includes HTN, DM2, peripheral neuropathy, GERD.    PT Comments    Pt progressing with tx, was able to complete self care tasks on her own prior to tx. Is ambulating greater distances, on room air and with less assistance. 02 sats are remaining in 90s throughout tasks. Have reinforced seated there ex to be completed during down times.    Follow Up Recommendations  Home health PT;Supervision for mobility/OOB     Equipment Recommendations  None recommended by PT    Recommendations for Other Services       Precautions / Restrictions Precautions Precautions: Fall Precaution Comments: Peripheral neuropathy; urinary frequency Restrictions Weight Bearing Restrictions: No    Mobility  Bed Mobility               General bed mobility comments: pt received sitting in recliner  Transfers Overall transfer level: Needs assistance Equipment used: Rolling walker (2 wheeled) Transfers: Sit to/from Stand Sit to Stand: Modified independent (Device/Increase time)         General transfer comment: stands and transfers with mod I and RW  Ambulation/Gait Ambulation/Gait assistance: Supervision Gait Distance (Feet): 250 Feet Assistive device: Rolling walker (2 wheeled) Gait Pattern/deviations: Step-through pattern;Decreased stride length;Trunk flexed     General Gait Details: ambulates slowly, initially able to have conversation with therapist but towards end is breathing hard DOE 3/4 but sats on monitor via earlobe probe are >90 throughout.    Stairs             Wheelchair Mobility    Modified Rankin (Stroke Patients Only)       Balance Overall balance assessment: Needs assistance   Sitting balance-Leahy Scale: Good   Postural control: Other  (comment)(does not lean) Standing balance support: Bilateral upper extremity supported Standing balance-Leahy Scale: Fair                              Cognition Arousal/Alertness: Awake/alert Behavior During Therapy: WFL for tasks assessed/performed Overall Cognitive Status: Within Functional Limits for tasks assessed                                        Exercises General Exercises - Lower Extremity Ankle Circles/Pumps: 10 reps Long Arc Quad: 10 reps Hip ABduction/ADduction: 10 reps Hip Flexion/Marching: 10 reps    General Comments General comments (skin integrity, edema, etc.): Pt seems to be doing better with mobility, she is able to complete self care on her own, able to ambulate longer distances with less assistance and also on room air and maintain sats in 90s.      Pertinent Vitals/Pain Pain Assessment: No/denies pain    Home Living                      Prior Function            PT Goals (current goals can now be found in the care plan section) Acute Rehab PT Goals Patient Stated Goal: states she is to go home with spouse tomorrow Time For Goal Achievement: 01/15/19 Potential to Achieve Goals: Good Progress towards PT goals: Progressing toward goals    Frequency  Min 3X/week      PT Plan Current plan remains appropriate    Co-evaluation              AM-PAC PT "6 Clicks" Mobility   Outcome Measure  Help needed turning from your back to your side while in a flat bed without using bedrails?: None Help needed moving from lying on your back to sitting on the side of a flat bed without using bedrails?: None Help needed moving to and from a bed to a chair (including a wheelchair)?: A Little Help needed standing up from a chair using your arms (e.g., wheelchair or bedside chair)?: None Help needed to walk in hospital room?: A Little Help needed climbing 3-5 steps with a railing? : A Little 6 Click Score: 21     End of Session   Activity Tolerance: Patient tolerated treatment well Patient left: in chair;with call bell/phone within reach Nurse Communication: Mobility status PT Visit Diagnosis: Other abnormalities of gait and mobility (R26.89);Muscle weakness (generalized) (M62.81)     Time: 8341-9622 PT Time Calculation (min) (ACUTE ONLY): 26 min  Charges:  $Gait Training: 8-22 mins $Therapeutic Exercise: 8-22 mins                     Hailey Greene, PT    Hailey Greene 01/03/2019, 1:04 PM

## 2019-01-03 NOTE — Progress Notes (Signed)
PROGRESS NOTE                                                                                                                                                                                                             Patient Demographics:    Hailey Greene, is a 68 y.o. female, DOB - [birth year], TWS:[id number]  Outpatient Primary MD for the patient is Cox, Fritzi Mandes, MD   Admit date - 12/31/2018   LOS - 3  Chief Complaint  Patient presents with  . Shortness of Breath    COVID +        Brief Narrative: Patient is a 68 y.o. female with PMHx of HTN, DM-2-diagnosed with COVID-19 on 10/3-presented with worsening shortness of breath, failure to thrive symptoms-she was found to have acute hypoxic respiratory failure secondary to COVID-19 pneumonia and admitted to the hospitalist service.  See below for further details.   Subjective:   Lying comfortably in bed-no chest pain or shortness of breath.  Anxious about going home soon.   Assessment  & Plan :   Acute Hypoxic Resp Failure due to Covid 19 Viral pneumonia: Continues to slowly improve-on room air.  Plan is to continue steroids and Remdesivir.   Fever: afebrile  O2 requirements: On room air  COVID-19 Labs: Recent Labs    12/31/18 1609 01/01/19 0215 01/02/19 0207 01/03/19 0437  DDIMER  --  0.42 0.53* 0.39  FERRITIN 421* 380* 353* 326*  LDH 152  --   --   --   CRP 0.8 1.0* <0.8 6.5*    Lab Results  Component Value Date   SARSCOV2NAA POSITIVE (A) 12/31/2018     COVID-19 Medications: Steroids: 10/5>> Remdesivir: 10/5>> Actemra: Not indicated Convalescent Plasma: Not indicated Research Studies:N/A  Other medications: Diuretics:Euvolemic-1 dose of Lasix to maintain negative balance. Antibiotics:Not needed as no evidence of bacterial infection  Prone/Incentive Spirometry:  encourage incentive spirometry use 3-4/hour.  DVT Prophylaxis  :  Lovenox   Prolonged QTC: Secondary to hypokalemia-resolved  Hypokalemia: Replete and recheck.  GERD: Continue PPI  Diabetic peripheral neuropathy: Continue Cymbalta/Neurontin-appears stable  HTN: Controlled-continue HCTZ  DM-2: CBG stable with SSI  CBG (last 3)  Recent Labs    01/02/19 1645 01/02/19 2026 01/03/19 0756  GLUCAP 164* 152* 84    Obesity: Estimated body mass index is 41.63 kg/m as calculated  from the following:   Height as of this encounter: 5\' 3"  (1.6 m).   Weight as of this encounter: 106.6 kg.   ABG: No results found for: PHART, PCO2ART, PO2ART, HCO3, TCO2, ACIDBASEDEF, O2SAT  Vent Settings: N/A  Condition - Stable  Family Communication  :  Spouse updated over the phone on 10/7-we will update on 10/9.  Code Status :  Full Code  Diet :  Diet Order            Diet Heart Room service appropriate? Yes; Fluid consistency: Thin  Diet effective now               Disposition Plan  :  Remain hospitalized-suspect home in the next day or so if clinical improvement continues.  Barriers to discharge: IV Remdesivir x5 days  Consults  :  None  Procedures  :  None  Antibiotics  :    Anti-infectives (From admission, onward)   Start     Dose/Rate Route Frequency Ordered Stop   01/01/19 1600  remdesivir 100 mg in sodium chloride 0.9 % 250 mL IVPB     100 mg 500 mL/hr over 30 Minutes Intravenous Every 24 hours 12/31/18 1716 01/05/19 1559   12/31/18 1830  remdesivir 200 mg in sodium chloride 0.9 % 250 mL IVPB     200 mg 500 mL/hr over 30 Minutes Intravenous Once 12/31/18 1716 12/31/18 1900      Inpatient Medications  Scheduled Meds: . clonazePAM  1 mg Oral BID  . DULoxetine  60 mg Oral BID  . enoxaparin (LOVENOX) injection  50 mg Subcutaneous Q24H  . gabapentin  200 mg Oral TID  . hydrochlorothiazide  25 mg Oral Daily  . insulin aspart  0-9 Units Subcutaneous TID WC  . methylPREDNISolone (SOLU-MEDROL) injection  40 mg Intravenous Daily  . nortriptyline   25 mg Oral QHS  . pantoprazole  40 mg Oral Daily  . rOPINIRole  3 mg Oral QHS  . sodium chloride flush  3 mL Intravenous Q12H  . vitamin C  500 mg Oral Daily  . zinc sulfate  220 mg Oral Daily   Continuous Infusions: . remdesivir 100 mg in NS 250 mL Stopped (01/02/19 1707)   PRN Meds:.acetaminophen, chlorpheniramine-HYDROcodone, guaiFENesin-dextromethorphan, ondansetron (ZOFRAN) IV   Time Spent in minutes  25  See all Orders from today for further details   Jeoffrey MassedShanker Lui Bellis M.D on 01/03/2019 at 11:06 AM  To page go to [email or web address] - use universal password  [facility] -  Office  [phone]    Objective:   Vitals:   01/02/19 1600 01/02/19 1928 01/03/19 0430 01/03/19 0756  BP: (!) 110/55 112/90 121/72 (!) 103/92  Pulse:  82 84 74  Resp: 18 18 18 16   Temp: 98.3 F (36.8 C) 98.6 F (37 C) 99 F (37.2 C) 98.5 F (36.9 C)  TempSrc: Oral Oral Oral Oral  SpO2:  100% 98% 98%  Weight:      Height:        Wt Readings from Last 3 Encounters:  12/31/18 106.6 kg  04/11/14 92.6 kg  02/05/14 89.1 kg     Intake/Output Summary (Last 24 hours) at 01/03/2019 1106 Last data filed at 01/03/2019 1008 Gross per 24 hour  Intake 1330 ml  Output 400 ml  Net 930 ml     Physical Exam Gen Exam:Alert awake-not in any distress HEENT:atraumatic, normocephalic Chest: B/L clear to auscultation anteriorly CVS:S1S2 regular Abdomen:soft non tender, non distended Extremities:no edema Neurology: Non  focal Skin: no rash   Data Review:    CBC Recent Labs  Lab 12/31/18 1028 01/01/19 0215 01/02/19 0207 01/03/19 0437  WBC 5.4 2.5* 9.6 4.6  HGB 14.6 13.3 13.4 13.6  HCT 43.6 40.7 41.6 41.6  PLT 231 226 255 262  MCV 84.3 85.0 86.3 85.4  MCH 28.2 27.8 27.8 27.9  MCHC 33.5 32.7 32.2 32.7  RDW 14.9 15.1 15.5 15.0  LYMPHSABS 0.5* 0.4* 1.2 1.5  MONOABS 0.5 0.1 0.8 0.6  EOSABS 0.0 0.0 0.0 0.0  BASOSABS 0.0 0.0 0.0 0.0    Chemistries  Recent Labs  Lab 12/31/18 1028  01/01/19 0215 01/02/19 0207 01/03/19 0437  NA 134* 138 138 133*  K 3.2* 3.5 3.5 3.4*  CL 98 103 99 94*  CO2 24 22 24 26   GLUCOSE 155* 144* 98 96  BUN 18 16 25* 25*  CREATININE 0.78 0.65 0.78 0.81  CALCIUM 8.9 8.4* 8.8* 8.5*  MG  --  2.0 2.0 1.9  AST 22 19 17 18   ALT 26 22 20 21   ALKPHOS 89 81 69 71  BILITOT 0.8 0.5 0.4 0.8   ------------------------------------------------------------------------------------------------------------------ No results for input(s): CHOL, HDL, LDLCALC, TRIG, CHOLHDL, LDLDIRECT in the last 72 hours.  Lab Results  Component Value Date   HGBA1C 6.2 (H) 01/01/2019   ------------------------------------------------------------------------------------------------------------------ No results for input(s): TSH, T4TOTAL, T3FREE, THYROIDAB in the last 72 hours.  Invalid input(s): FREET3 ------------------------------------------------------------------------------------------------------------------ Recent Labs    01/02/19 0207 01/03/19 0437  FERRITIN 353* 326*    Coagulation profile No results for input(s): INR, PROTIME in the last 168 hours.  Recent Labs    01/02/19 0207 01/03/19 0437  DDIMER 0.53* 0.39    Cardiac Enzymes No results for input(s): CKMB, TROPONINI, MYOGLOBIN in the last 168 hours.  Invalid input(s): CK ------------------------------------------------------------------------------------------------------------------    Component Value Date/Time   BNP 29.1 01/03/2019 0437    Micro Results Recent Results (from the past 240 hour(s))  Blood culture (routine x 2)     Status: None (Preliminary result)   Collection Time: 12/31/18 10:40 AM   Specimen: BLOOD  Result Value Ref Range Status   Specimen Description   Final    BLOOD RIGHT ANTECUBITAL Performed at [facility], [place] [address], [place], [place] [address]    Special Requests   Final    BOTTLES DRAWN AEROBIC AND ANAEROBIC Blood Culture adequate volume  Performed at [facility] [address], [place], [place] [address]    Culture   Final    NO GROWTH 2 DAYS Performed at [facility] [address] [place], [place] [address]    Report Status PENDING  Incomplete  SARS Coronavirus 2 [facility] order, Performed in [facility] hospital lab) Nasopharyngeal Nasopharyngeal Swab     Status: Abnormal   Collection Time: 12/31/18 10:46 AM   Specimen: Nasopharyngeal Swab  Result Value Ref Range Status   SARS Coronavirus 2 POSITIVE (A) NEGATIVE Final    Comment: RESULT CALLED TO, READ BACK BY AND VERIFIED WITH:  MADDIE 12/31/18 1151 JM (NOTE) If result is NEGATIVE SARS-CoV-2 target nucleic acids are NOT DETECTED. The SARS-CoV-2 RNA is generally detectable in upper and lower  respiratory specimens during the acute phase of infection. The lowest  concentration of SARS-CoV-2 viral copies this assay can detect is 250  copies / mL. A negative result does not preclude SARS-CoV-2 infection  and should not be used as the sole basis for treatment or other  patient  management decisions.  A negative result may occur with  improper specimen collection / handling, submission of specimen other  than nasopharyngeal swab, presence of viral mutation(s) within the  areas targeted by this assay, and inadequate number of viral copies  (<250 copies / mL). A negative result must be combined with clinical  observations, patient history, and epidemiological information. If result is POSITIVE SARS-CoV-2 target nucleic acids are DETECTED. The SARS-CoV- 2 RNA is generally detectable in upper and lower  respiratory specimens during the acute phase of infection.  Positive  results are indicative of active infection with SARS-CoV-2.  Clinical  correlation with patient history and other diagnostic information is  necessary to determine patient infection status.  Positive results do  not rule out bacterial infection or co-infection with other  viruses. If result is PRESUMPTIVE POSTIVE SARS-CoV-2 nucleic acids MAY BE PRESENT.   A presumptive positive result was obtained on the submitted specimen  and confirmed on repeat testing.  While 2019 novel coronavirus  (SARS-CoV-2) nucleic acids may be present in the submitted sample  additional confirmatory testing may be necessary for epidemiological  and / or clinical management purposes  to differentiate between  SARS-CoV-2 and other Sarbecovirus currently known to infect humans.  If clinically indicated additional testing with an alternate test  methodology [phone]) is advised. Th e SARS-CoV-2 RNA is generally  detectable in upper and lower respiratory specimens during the acute  phase of infection. The expected result is Negative. Fact Sheet for Patients:  BoilerBrush.com.cy Fact Sheet for Healthcare Providers: https://pope.com/ This test is not yet approved or cleared by the [place] FDA and has been authorized for detection and/or diagnosis of SARS-CoV-2 by FDA under an Emergency Use Authorization (EUA).  This EUA will remain in effect (meaning this test can be used) for the duration of the COVID-19 declaration under Section 564(b)(1) of the Act, 21 U.S.C. section 360bbb-3(b)(1), unless the authorization is terminated or revoked sooner. Performed at [facility], [address] [place], [place] [address]   Blood culture (routine x 2)     Status: None (Preliminary result)   Collection Time: 12/31/18 10:50 AM   Specimen: BLOOD LEFT HAND  Result Value Ref Range Status   Specimen Description   Final    BLOOD LEFT HAND Performed at [facility], [address] [place], [place] [address]    Special Requests   Final    BOTTLES DRAWN AEROBIC AND ANAEROBIC Blood Culture adequate volume Performed at [facility], [address] [place], [place] [address]    Culture    Final    NO GROWTH 2 DAYS Performed at [facility], [address] [place], [place] [address]    Report Status PENDING  Incomplete    Radiology Reports Dg Chest Portable 1 View  Result Date: 12/31/2018 CLINICAL DATA:  [place] positive, shortness of breath. EXAM: PORTABLE CHEST 1 VIEW COMPARISON:  12/30/2018 FINDINGS: Heart size mediastinal contours are stable. Persistent left basilar and peripheral opacities are noted. No signs of dense consolidation or pleural effusion. No acute bone finding. IMPRESSION: Vague airspace opacities in the left chest, could be seen in setting of viral pneumonia. Consider follow-up to ensure resolution. Electronically Signed   By: Donzetta Kohut M.D.   On: 12/31/2018 11:30

## 2019-01-03 NOTE — Progress Notes (Signed)
Spoke with patient's spouse, updated him and answered any questions.

## 2019-01-03 NOTE — Progress Notes (Signed)
Pt stated had talked with family during the day/evening.

## 2019-01-03 NOTE — Progress Notes (Signed)
Pt inquired about what meds, if any, she would be doing home when discharged.  Nurse explained it will depend.  Pt did inquire about an inhaler.  Nurse asked if she has one at home and know how to use it. Pt did stated she has one at home but uncomfortable with use one. Nurse walked pt through how to use the inhaler - before use and during.  Nurse stressed on importance of holding breathing after inhaling meds.  Pt stated she understand the demo and explanation on proper use of hand held inhaler.

## 2019-01-04 LAB — COMPREHENSIVE METABOLIC PANEL
ALT: 19 U/L (ref 0–44)
AST: 14 U/L — ABNORMAL LOW (ref 15–41)
Albumin: 3.3 g/dL — ABNORMAL LOW (ref 3.5–5.0)
Alkaline Phosphatase: 69 U/L (ref 38–126)
Anion gap: 11 (ref 5–15)
BUN: 24 mg/dL — ABNORMAL HIGH (ref 8–23)
CO2: 28 mmol/L (ref 22–32)
Calcium: 8.9 mg/dL (ref 8.9–10.3)
Chloride: 91 mmol/L — ABNORMAL LOW (ref 98–111)
Creatinine, Ser: 0.76 mg/dL (ref 0.44–1.00)
GFR calc Af Amer: >60 mL/min (ref >60)
GFR calc non Af Amer: >60 mL/min (ref >60)
Glucose, Bld: 158 mg/dL — ABNORMAL HIGH (ref 70–99)
Potassium: 5.4 mmol/L — ABNORMAL HIGH (ref 3.5–5.1)
Sodium: 130 mmol/L — ABNORMAL LOW (ref 135–145)
Total Bilirubin: 0.5 mg/dL (ref 0.3–1.2)
Total Protein: 6.3 g/dL — ABNORMAL LOW (ref 6.5–8.1)

## 2019-01-04 LAB — FERRITIN: Ferritin: 321 ng/mL — ABNORMAL HIGH (ref 11–307)

## 2019-01-04 LAB — CBC WITH DIFFERENTIAL/PLATELET
Abs Immature Granulocytes: 0.03 10*3/uL (ref 0.00–0.07)
Basophils Absolute: 0 10*3/uL (ref 0.0–0.1)
Basophils Relative: 0 %
Eosinophils Absolute: 0 10*3/uL (ref 0.0–0.5)
Eosinophils Relative: 0 %
HCT: 40.9 % (ref 36.0–46.0)
Hemoglobin: 13.4 g/dL (ref 12.0–15.0)
Immature Granulocytes: 1 %
Lymphocytes Relative: 15 %
Lymphs Abs: 0.6 10*3/uL — ABNORMAL LOW (ref 0.7–4.0)
MCH: 27.7 pg (ref 26.0–34.0)
MCHC: 32.8 g/dL (ref 30.0–36.0)
MCV: 84.7 fL (ref 80.0–100.0)
Monocytes Absolute: 0.2 10*3/uL (ref 0.1–1.0)
Monocytes Relative: 4 %
Neutro Abs: 3.2 10*3/uL (ref 1.7–7.7)
Neutrophils Relative %: 80 %
Platelets: 276 10*3/uL (ref 150–400)
RBC: 4.83 MIL/uL (ref 3.87–5.11)
RDW: 14.5 % (ref 11.5–15.5)
WBC: 4 10*3/uL (ref 4.0–10.5)
nRBC: 0 % (ref 0.0–0.2)

## 2019-01-04 LAB — BRAIN NATRIURETIC PEPTIDE: B Natriuretic Peptide: 57.9 pg/mL (ref 0.0–100.0)

## 2019-01-04 LAB — D-DIMER, QUANTITATIVE: D-Dimer, Quant: 0.35 ug/mL-FEU (ref 0.00–0.50)

## 2019-01-04 LAB — GLUCOSE, CAPILLARY
Glucose-Capillary: 137 mg/dL — ABNORMAL HIGH (ref 70–99)
Glucose-Capillary: 239 mg/dL — ABNORMAL HIGH (ref 70–99)

## 2019-01-04 LAB — C-REACTIVE PROTEIN: CRP: 3.3 mg/dL — ABNORMAL HIGH (ref <1.0)

## 2019-01-04 LAB — MAGNESIUM: Magnesium: 2.1 mg/dL (ref 1.7–2.4)

## 2019-01-04 MED ORDER — SODIUM ZIRCONIUM CYCLOSILICATE 10 G PO PACK
10.0000 g | PACK | Freq: Every day | ORAL | Status: AC
  Administered 2019-01-04: 10 g via ORAL
  Filled 2019-01-04: qty 1

## 2019-01-04 MED ORDER — FUROSEMIDE 10 MG/ML IJ SOLN
20.0000 mg | Freq: Once | INTRAMUSCULAR | Status: AC
  Administered 2019-01-04: 20 mg via INTRAVENOUS
  Filled 2019-01-04: qty 2

## 2019-01-04 MED ORDER — PREDNISONE 10 MG PO TABS: ORAL_TABLET | ORAL | 0 refills | Status: DC

## 2019-01-04 NOTE — Discharge Summary (Signed)
PATIENT DETAILS Name: Hailey Greene Age: 68 y.o. Sex: female Date of Birth: 11-May-1950 MRN: [id number]. Admitting Physician: Standley Brooking, MD PCP:Cox, Fritzi Mandes, MD  Admit Date: 12/31/2018 Discharge date: 01/04/2019  Recommendations for Outpatient Follow-up:  1. Follow up with PCP in 1-2 weeks 2. Please obtain BMP/CBC in 2-3 days  Admitted From:  Home  Disposition: Home with home health services   Home Health:  Yes  Equipment/Devices: None  Discharge Condition: Stable  CODE STATUS: FULL CODE  Diet recommendation:  Diet Order            Diet - low sodium heart healthy        Diet Heart Room service appropriate? Yes; Fluid consistency: Thin  Diet effective now               Brief Summary: See H&P, Labs, Consult and Test reports for all details in brief,Patient is a 68 y.o. female with PMHx of HTN, DM-2-diagnosed with COVID-19 on 10/3-presented with worsening shortness of breath, failure to thrive symptoms-she was found to have acute hypoxic respiratory failure secondary to COVID-19 pneumonia and admitted to the hospitalist service.  See below for further details.  Brief Hospital Course: Acute Hypoxic Resp Failure due to Covid 19 Viral pneumonia: Much improved-has been on room air for the past several days-has completed a course of Remdesivir-Taper steroids over the next few days.  COVID-19 Medications: Steroids: 10/5>> Remdesivir: 10/5>>10/9 Actemra: Not indicated Convalescent Plasma: Not indicated Research Studies:N/A  COVID-19 Labs  Recent Labs    01/02/19 0207 01/03/19 0437 01/04/19 0400  DDIMER 0.53* 0.39 0.35  FERRITIN 353* 326* 321*  CRP <0.8 6.5* 3.3*    Lab Results  Component Value Date   SARSCOV2NAA POSITIVE (A) 12/31/2018    Prolonged QTC: Secondary to hypokalemia-resolved  Hypokalemia: Repleted-now with mild hyperkalemia-will be treated with Lokelma and Lasix x1.  Have asked patient to follow-up with PCP early next  week.  GERD: Continue PPI  Diabetic peripheral neuropathy: Continue Cymbalta/Neurontin-appears stable  HTN: Controlled-continue HCTZ  DM-2: CBG stable with SSI-resume oral hypoglycemic agents on discharge  Obesity: Estimated body mass index is 41.63 kg/m as calculated from the following:   Height as of this encounter: 5\' 3"  (1.6 m).   Weight as of this encounter: 106.6 kg.    Procedures/Studies: None  Discharge Diagnoses:  Principal Problem:   2019 novel coronavirus disease (COVID-19) Active Problems:   Sensory neuropathy   Type II diabetes mellitus, uncontrolled (HCC)   Benign essential HTN   Prolonged QT interval   Discharge Instructions:  Activity:  As tolerated   Discharge Instructions    Call MD for:  difficulty breathing, headache or visual disturbances   Complete by: As directed    Call MD for:  persistant dizziness or light-headedness   Complete by: As directed    Diet - low sodium heart healthy   Complete by: As directed    Increase activity slowly   Complete by: As directed      Allergies as of 01/04/2019   No Known Allergies     Medication List    STOP taking these medications   azithromycin 250 MG tablet Commonly known as: ZITHROMAX     TAKE these medications   clonazePAM 1 MG tablet Commonly known as: KLONOPIN Take 1 mg by mouth 2 (two) times daily.   DULoxetine 60 MG capsule Commonly known as: CYMBALTA Take 60 mg by mouth 2 (two) times daily.   gabapentin 300 MG capsule Commonly  known as: NEURONTIN Take 1 tablet bedtime for 1 week, then increase to 1 tablet twice daily.   hydrochlorothiazide 25 MG tablet Commonly known as: HYDRODIURIL Take 25 mg by mouth daily.   meloxicam 15 MG tablet Commonly known as: MOBIC Take 15 mg by mouth daily.   metFORMIN 500 MG tablet Commonly known as: GLUCOPHAGE Take 500 mg by mouth daily with breakfast.   nortriptyline 25 MG capsule Commonly known as: PAMELOR Take 1 capsule (25 mg  total) by mouth at bedtime.   omeprazole 20 MG capsule Commonly known as: PRILOSEC Take 20 mg by mouth daily.   ondansetron 4 MG disintegrating tablet Commonly known as: ZOFRAN-ODT Take 4 mg by mouth every 8 (eight) hours as needed for nausea or vomiting.   predniSONE 10 MG tablet Commonly known as: DELTASONE Take 40 mg daily for 1 day, 30 mg daily for 1 day, 20 mg daily for 1 days,10 mg daily for 1 day, then stop   rOPINIRole 3 MG tablet Commonly known as: REQUIP Take 3 mg by mouth at bedtime.      Follow-up Information    Care, [facility] Follow up.   Specialty: [place] Why: A representative from [facility] will contact you to arrange start date and time for your therapy.  Contact information: [place] [phone]        Rochel Brome, MD. Schedule an appointment as soon as possible for a visit in 1 week(s).   Specialties: Family Medicine, Interventional Cardiology, Radiology, Anesthesiology Contact information: [address] [place] [address] [phone]          No Known Allergies  Consultations:   None   Other Procedures/Studies: Dg Chest Portable 1 View  Result Date: 12/31/2018 CLINICAL DATA:  Coban positive, shortness of breath. EXAM: PORTABLE CHEST 1 VIEW COMPARISON:  12/30/2018 FINDINGS: Heart size mediastinal contours are stable. Persistent left basilar and peripheral opacities are noted. No signs of dense consolidation or pleural effusion. No acute bone finding. IMPRESSION: Vague airspace opacities in the left chest, could be seen in setting of viral pneumonia. Consider follow-up to ensure resolution. Electronically Signed   By: Zetta Bills M.D.   On: 12/31/2018 11:30      TODAY-DAY OF DISCHARGE:  Subjective:   Hailey Greene today has no headache,no chest abdominal pain,no new weakness tingling or numbness, feels much better wants to go home today.    Objective:   Blood pressure 116/80, pulse 85, temperature 98.1 F (36.7 C), temperature source Oral, resp. rate 18, height 5\' 3"  (1.6 m), weight 106.6 kg, SpO2 100 %.  Intake/Output Summary (Last 24 hours) at 01/04/2019 1003 Last data filed at 01/03/2019 1830 Gross per 24 hour  Intake 1490 ml  Output --  Net 1490 ml   Filed Weights   12/31/18 1146  Weight: 106.6 kg    Exam: Awake Alert, Oriented *3, No new F.N deficits, Normal affect [place].AT,PERRAL Supple Neck,No JVD, No cervical lymphadenopathy appriciated.  Symmetrical Chest wall movement, Good air movement bilaterally, CTAB RRR,No Gallops,Rubs or new Murmurs, No Parasternal Heave +ve B.Sounds, Abd Soft, Non tender, No organomegaly appriciated, No rebound -guarding or rigidity. No Cyanosis, Clubbing or edema, No new Rash or bruise   PERTINENT RADIOLOGIC STUDIES: Dg Chest Portable 1 View  Result Date: 12/31/2018 CLINICAL DATA:  Coban positive, shortness of breath. EXAM: PORTABLE CHEST 1 VIEW COMPARISON:  12/30/2018 FINDINGS: Heart size mediastinal contours are stable. Persistent left  basilar and peripheral opacities are noted. No signs of dense consolidation or pleural effusion. No acute bone finding. IMPRESSION: Vague airspace opacities in the left chest, could be seen in setting of viral pneumonia. Consider follow-up to ensure resolution. Electronically Signed   By: Donzetta Kohut M.D.   On: 12/31/2018 11:30     PERTINENT LAB RESULTS: CBC: Recent Labs    01/03/19 0437 01/04/19 0400  WBC 4.6 4.0  HGB 13.6 13.4  HCT 41.6 40.9  PLT 262 276   CMET CMP     Component Value Date/Time   NA 130 (L) 01/04/2019 0400   K 5.4 (H) 01/04/2019 0400   CL 91 (L) 01/04/2019 0400   CO2 28 01/04/2019 0400   GLUCOSE 158 (H) 01/04/2019 0400   BUN 24 (H) 01/04/2019 0400   CREATININE 0.76 01/04/2019 0400   CALCIUM 8.9 01/04/2019 0400   PROT 6.3 (L) 01/04/2019 0400   ALBUMIN 3.3 (L) 01/04/2019 0400   AST 14 (L) 01/04/2019 0400    ALT 19 01/04/2019 0400   ALKPHOS 69 01/04/2019 0400   BILITOT 0.5 01/04/2019 0400   GFRNONAA >60 01/04/2019 0400   GFRAA >60 01/04/2019 0400    GFR Estimated Creatinine Clearance: 79.8 mL/min (by C-G formula based on SCr of 0.76 mg/dL). No results for input(s): LIPASE, AMYLASE in the last 72 hours. No results for input(s): CKTOTAL, CKMB, CKMBINDEX, TROPONINI in the last 72 hours. Invalid input(s): POCBNP Recent Labs    01/03/19 0437 01/04/19 0400  DDIMER 0.39 0.35   No results for input(s): HGBA1C in the last 72 hours. No results for input(s): CHOL, HDL, LDLCALC, TRIG, CHOLHDL, LDLDIRECT in the last 72 hours. No results for input(s): TSH, T4TOTAL, T3FREE, THYROIDAB in the last 72 hours.  Invalid input(s): FREET3 Recent Labs    01/03/19 0437 01/04/19 0400  FERRITIN 326* 321*   Coags: No results for input(s): INR in the last 72 hours.  Invalid input(s): PT Microbiology: Recent Results (from the past 240 hour(s))  Blood culture (routine x 2)     Status: None (Preliminary result)   Collection Time: 12/31/18 10:40 AM   Specimen: BLOOD  Result Value Ref Range Status   Specimen Description   Final    BLOOD RIGHT ANTECUBITAL Performed at [facility], [address], [place], [place] [address]    Special Requests   Final    BOTTLES DRAWN AEROBIC AND ANAEROBIC Blood Culture adequate volume Performed at [facility], [address] [place], [place] [address]    Culture   Final    NO GROWTH 3 DAYS Performed at [facility], [address] [place], [place] [address]    Report Status PENDING  Incomplete  SARS Coronavirus 2 [facility] order, Performed in [facility] hospital lab) Nasopharyngeal Nasopharyngeal Swab     Status: Abnormal   Collection Time: 12/31/18 10:46 AM   Specimen: Nasopharyngeal Swab  Result Value Ref Range Status   SARS Coronavirus 2 POSITIVE (A) NEGATIVE Final    Comment: RESULT CALLED TO, READ BACK BY AND VERIFIED  WITH:  MADDIE 12/31/18 1151 JM (NOTE) If result is NEGATIVE SARS-CoV-2 target nucleic acids are NOT DETECTED. The SARS-CoV-2 RNA is generally detectable in upper and lower  respiratory specimens during the acute phase of infection. The lowest  concentration of SARS-CoV-2 viral copies this assay can detect is 250  copies / mL. A negative result does not preclude SARS-CoV-2 infection  and should not be used as the sole  basis for treatment or other  patient management decisions.  A negative result may occur with  improper specimen collection / handling, submission of specimen other  than nasopharyngeal swab, presence of viral mutation(s) within the  areas targeted by this assay, and inadequate number of viral copies  (<250 copies / mL). A negative result must be combined with clinical  observations, patient history, and epidemiological information. If result is POSITIVE SARS-CoV-2 target nucleic acids are DETECTED. The SARS-CoV- 2 RNA is generally detectable in upper and lower  respiratory specimens during the acute phase of infection.  Positive  results are indicative of active infection with SARS-CoV-2.  Clinical  correlation with patient history and other diagnostic information is  necessary to determine patient infection status.  Positive results do  not rule out bacterial infection or co-infection with other viruses. If result is PRESUMPTIVE POSTIVE SARS-CoV-2 nucleic acids MAY BE PRESENT.   A presumptive positive result was obtained on the submitted specimen  and confirmed on repeat testing.  While 2019 novel coronavirus  (SARS-CoV-2) nucleic acids may be present in the submitted sample  additional confirmatory testing may be necessary for epidemiological  and / or clinical management purposes  to differentiate between  SARS-CoV-2 and other Sarbecovirus currently known to infect humans.  If clinically indicated additional testing with an alternate test  methodology [phone]) is  advised. Th e SARS-CoV-2 RNA is generally  detectable in upper and lower respiratory specimens during the acute  phase of infection. The expected result is Negative. Fact Sheet for Patients:  BoilerBrush.com.cyhttps://www.fda.gov/media/136312/download Fact Sheet for Healthcare Providers: https://pope.com/https://www.fda.gov/media/136313/download This test is not yet approved or cleared by the [place] FDA and has been authorized for detection and/or diagnosis of SARS-CoV-2 by FDA under an Emergency Use Authorization (EUA).  This EUA will remain in effect (meaning this test can be used) for the duration of the COVID-19 declaration under Section 564(b)(1) of the Act, 21 U.S.C. section 360bbb-3(b)(1), unless the authorization is terminated or revoked sooner. Performed at [facility], [address] [place], [place] [address]   Blood culture (routine x 2)     Status: None (Preliminary result)   Collection Time: 12/31/18 10:50 AM   Specimen: BLOOD LEFT HAND  Result Value Ref Range Status   Specimen Description   Final    BLOOD LEFT HAND Performed at [facility], [address] [place], [place] [address]    Special Requests   Final    BOTTLES DRAWN AEROBIC AND ANAEROBIC Blood Culture adequate volume Performed at [facility], [address] [place], [place] [address]    Culture   Final    NO GROWTH 3 DAYS Performed at [name], [address] [place], [place] [address]    Report Status PENDING  Incomplete    FURTHER DISCHARGE INSTRUCTIONS:  Get Medicines reviewed and adjusted: Please take all your medications with you for your next visit with your Primary MD  Laboratory/radiological data: Please request your Primary MD to go over all hospital tests and procedure/radiological results at the follow up, please ask your Primary MD to get all Hospital records sent to his/her office.  In some cases, they will be blood work, cultures and  biopsy results pending at the time of your discharge. Please request that your primary care M.D. goes through all the records of your hospital data and follows up on these results.  Also Note the following: If you experience worsening of your admission symptoms,  develop shortness of breath, life threatening emergency, suicidal or homicidal thoughts you must seek medical attention immediately by calling 911 or calling your MD immediately  if symptoms less severe.  You must read complete instructions/literature along with all the possible adverse reactions/side effects for all the Medicines you take and that have been prescribed to you. Take any new Medicines after you have completely understood and accpet all the possible adverse reactions/side effects.   Do not drive when taking Pain medications or sleeping medications (Benzodaizepines)  Do not take more than prescribed Pain, Sleep and Anxiety Medications. It is not advisable to combine anxiety,sleep and pain medications without talking with your primary care practitioner  Special Instructions: If you have smoked or chewed Tobacco  in the last 2 yrs please stop smoking, stop any regular Alcohol  and or any Recreational drug use.  Wear Seat belts while driving.  Please note: You were cared for by a hospitalist during your hospital stay. Once you are discharged, your primary care physician will handle any further medical issues. Please note that NO REFILLS for any discharge medications will be authorized once you are discharged, as it is imperative that you return to your primary care physician (or establish a relationship with a primary care physician if you do not have one) for your post hospital discharge needs so that they can reassess your need for medications and monitor your lab values.  Total Time spent coordinating discharge including counseling, education and face to face time equals 35 minutes.  Signed: Matti Killingsworth 01/04/2019 10:03  AM

## 2019-01-04 NOTE — TOC Transition Note (Signed)
Transition of Care [facility]) - CM/SW Discharge Note   Patient Details  Name: Hailey Greene MRN: [id number] Date of Birth: 1951/01/17  [facility] CM/SW Contact:  Ninfa Meeker, RN Phone Number: [phone] (working remotely) 01/04/2019, 9:42 AM   Clinical Narrative:   68 yr old female admitted and treated for COVID 19. Thankfully, patient is improving and will discharge home today. Case manager spoke with patient via telephone to discuss need for Home Helaht. Choice was offered. Patient will have support of her husband at discharge, no further needs identified.       Final next level of care: [place] Barriers to Discharge: No Barriers Identified   Patient Goals and CMS Choice Patient states their goals for this hospitalization and ongoing recovery are:: continue to recover CMS Medicare.gov Compare Post Acute Care list provided to:: Patient Choice offered to / list presented to : Patient  Discharge Placement                       Discharge Plan and Services In-house Referral: NA Discharge Planning Services: CM Consult Post Acute Care Choice: Home Health          DME Arranged: N/A DME Agency: NA       HH Arranged: PT HH Agency: [place] Date [facility] Agency Contacted: 01/04/19 Time [place]: [phone] Representative spoke with at [place]: Adela Lank  Social Determinants of Health (SDOH) Interventions     Readmission Risk Interventions No flowsheet data found.

## 2019-01-04 NOTE — Care Management Important Message (Signed)
Important Message  Patient Details  Name: Hailey Greene MRN: [id number] Date of Birth: 24-Jun-1950   Medicare Important Message Given:  Yes - Important Message mailed due to current National Emergency  Verbal consent obtained due to current National Emergency  Relationship to patient: Spouse/Significant Other Contact Name: Shrika Milos Call Date: 01/04/19  Time: 1419 Phone: [phone] Outcome: Spoke with contact Important Message mailed to: Patient address on file    [place] 01/04/2019, 2:19 PM

## 2019-01-04 NOTE — Progress Notes (Signed)
PIV removed, Last dose of remdesivir Iv given, No Respiratory distress, RA 99%. D/c via wheelchair.

## 2019-01-05 LAB — CULTURE, BLOOD (ROUTINE X 2)
Culture: NO GROWTH
Culture: NO GROWTH
Special Requests: ADEQUATE
Special Requests: ADEQUATE

## 2019-01-25 ENCOUNTER — Ambulatory Visit: Payer: Self-pay | Admitting: Sports Medicine

## 2019-01-26 ENCOUNTER — Other Ambulatory Visit: Payer: Self-pay

## 2019-01-26 ENCOUNTER — Emergency Department (HOSPITAL_COMMUNITY): Payer: 59

## 2019-01-26 ENCOUNTER — Emergency Department (HOSPITAL_COMMUNITY)
Admission: EM | Admit: 2019-01-26 | Discharge: 2019-01-26 | Disposition: A | Discharge status: Home / Self Care | Payer: 59 | Attending: Emergency Medicine | Admitting: Emergency Medicine

## 2019-01-26 ENCOUNTER — Encounter (HOSPITAL_COMMUNITY): Payer: Self-pay | Admitting: Emergency Medicine

## 2019-01-26 DIAGNOSIS — R112 Nausea with vomiting, unspecified: Secondary | ICD-10-CM | POA: Diagnosis not present

## 2019-01-26 DIAGNOSIS — Z7984 Long term (current) use of oral hypoglycemic drugs: Secondary | ICD-10-CM | POA: Insufficient documentation

## 2019-01-26 DIAGNOSIS — Z96659 Presence of unspecified artificial knee joint: Secondary | ICD-10-CM | POA: Diagnosis not present

## 2019-01-26 DIAGNOSIS — Z79899 Other long term (current) drug therapy: Secondary | ICD-10-CM | POA: Insufficient documentation

## 2019-01-26 DIAGNOSIS — R61 Generalized hyperhidrosis: Secondary | ICD-10-CM | POA: Diagnosis not present

## 2019-01-26 DIAGNOSIS — E876 Hypokalemia: Secondary | ICD-10-CM

## 2019-01-26 DIAGNOSIS — Z87891 Personal history of nicotine dependence: Secondary | ICD-10-CM | POA: Diagnosis not present

## 2019-01-26 DIAGNOSIS — R531 Weakness: Secondary | ICD-10-CM | POA: Insufficient documentation

## 2019-01-26 DIAGNOSIS — G4700 Insomnia, unspecified: Secondary | ICD-10-CM | POA: Diagnosis not present

## 2019-01-26 DIAGNOSIS — E1140 Type 2 diabetes mellitus with diabetic neuropathy, unspecified: Secondary | ICD-10-CM | POA: Diagnosis not present

## 2019-01-26 DIAGNOSIS — I10 Essential (primary) hypertension: Secondary | ICD-10-CM | POA: Insufficient documentation

## 2019-01-26 LAB — CBC WITH DIFFERENTIAL/PLATELET
Abs Immature Granulocytes: 0.03 10*3/uL (ref 0.00–0.07)
Basophils Absolute: 0 10*3/uL (ref 0.0–0.1)
Basophils Relative: 0 %
Eosinophils Absolute: 0 10*3/uL (ref 0.0–0.5)
Eosinophils Relative: 0 %
HCT: 39.6 % (ref 36.0–46.0)
Hemoglobin: 13.3 g/dL (ref 12.0–15.0)
Immature Granulocytes: 0 %
Lymphocytes Relative: 15 %
Lymphs Abs: 1.2 10*3/uL (ref 0.7–4.0)
MCH: 29.2 pg (ref 26.0–34.0)
MCHC: 33.6 g/dL (ref 30.0–36.0)
MCV: 86.8 fL (ref 80.0–100.0)
Monocytes Absolute: 0.6 10*3/uL (ref 0.1–1.0)
Monocytes Relative: 8 %
Neutro Abs: 5.9 10*3/uL (ref 1.7–7.7)
Neutrophils Relative %: 77 %
Platelets: 228 10*3/uL (ref 150–400)
RBC: 4.56 MIL/uL (ref 3.87–5.11)
RDW: 15.4 % (ref 11.5–15.5)
WBC: 7.8 10*3/uL (ref 4.0–10.5)
nRBC: 0 % (ref 0.0–0.2)

## 2019-01-26 LAB — BASIC METABOLIC PANEL
Anion gap: 8 (ref 5–15)
BUN: 7 mg/dL — ABNORMAL LOW (ref 8–23)
CO2: 26 mmol/L (ref 22–32)
Calcium: 8.8 mg/dL — ABNORMAL LOW (ref 8.9–10.3)
Chloride: 99 mmol/L (ref 98–111)
Creatinine, Ser: 0.6 mg/dL (ref 0.44–1.00)
GFR calc Af Amer: >60 mL/min (ref >60)
GFR calc non Af Amer: >60 mL/min (ref >60)
Glucose, Bld: 126 mg/dL — ABNORMAL HIGH (ref 70–99)
Potassium: 3.2 mmol/L — ABNORMAL LOW (ref 3.5–5.1)
Sodium: 133 mmol/L — ABNORMAL LOW (ref 135–145)

## 2019-01-26 LAB — CBG MONITORING, ED: Glucose-Capillary: 141 mg/dL — ABNORMAL HIGH (ref 70–99)

## 2019-01-26 MED ORDER — ONDANSETRON HCL 4 MG/2ML IJ SOLN
4.0000 mg | Freq: Once | INTRAMUSCULAR | Status: AC
  Administered 2019-01-26: 4 mg via INTRAVENOUS
  Filled 2019-01-26: qty 2

## 2019-01-26 MED ORDER — POTASSIUM CHLORIDE CRYS ER 20 MEQ PO TBCR
40.0000 meq | EXTENDED_RELEASE_TABLET | Freq: Once | ORAL | Status: AC
  Administered 2019-01-26: 17:00:00 40 meq via ORAL
  Filled 2019-01-26: qty 2

## 2019-01-26 MED ORDER — SODIUM CHLORIDE 0.9 % IV BOLUS
1000.0000 mL | Freq: Once | INTRAVENOUS | Status: AC
  Administered 2019-01-26: 16:00:00 1000 mL via INTRAVENOUS

## 2019-01-26 NOTE — ED Notes (Signed)
Patient tolerated PO medication and fluids w/o any complaints or difficulties.

## 2019-01-26 NOTE — ED Notes (Signed)
Patient made aware of urinalysis. Patient stated they are unable to provide sample at this time.

## 2019-01-26 NOTE — Discharge Instructions (Signed)
Please continue medicines as prescribed Drink plenty of fluids  Follow up with your doctor

## 2019-01-26 NOTE — ED Provider Notes (Signed)
  Face-to-face evaluation   History: Patient presents for eval difficulty sleeping, general weakness, decreased appetite, for several days.  She reports periods of sweating mostly at nighttime.  She denies shortness of breath or cough.  She was hospitalized for COVID-19 infection, about 3 weeks ago.  No complications during hospitalization and discharged at 5 days.  Physical exam: Obese, alert and cooperative.  Abdomen soft nontender.  No respiratory distress.  Normal range of motion arms legs bilaterally.  Medical screening examination/treatment/procedure(s) were conducted as a shared visit with non-physician practitioner(s) and myself.  I personally evaluated the patient during the encounter    Daleen Bo, MD 01/27/19 1113

## 2019-01-26 NOTE — ED Provider Notes (Signed)
[facility]-EMERGENCY DEPT Provider Note   CSN: [id number] Arrival date & time: 01/26/19  1423     History   Chief Complaint Chief Complaint  Patient presents with  . Fatigue    HPI Hailey Greene is a 68 y.o. female with recent COVID-19 infection, DM, and HTN presents with generalized weakness. She was admitted to [facility] hospital from 10/5-10/9. After clinical improvement she was discharged home with home health services. She completed a course of Remdesivir and steroids while hospitalized.  She followed up with her doctor who started her on Keflex yesterday for possible secondary bacterial infection.  She normally ambulates with a walker.  Over the past 3 days she has developed sweats, insomnia, fatigue and generalized weakness.  She has not been eating and drinking well.  She has been taking her medicines as prescribed.  She reports ongoing nausea with one episode of vomiting this morning despite taking Zofran.  Her diarrhea has resolved but she is still having loose stools.  She denies fever, headache, chest pain, shortness of breath, cough, abdominal pain, urinary symptoms.  She says she is currently in the process of moving from [place] to [place] and she is felt so fatigued that she decided to come back to the emergency department.     HPI  Past Medical History:  Diagnosis Date  . Chicken pox   . Depression   . Diabetes (HCC)   . Hypertension   . Neuropathy   . Tachycardia   . UTI (lower urinary tract infection)   . Weakness     Patient Active Problem List   Diagnosis Date Noted  . 2019 novel coronavirus disease (COVID-19) 12/31/2018  . Benign essential HTN 12/31/2018  . Prolonged QT interval 12/31/2018  . Type II diabetes mellitus, uncontrolled (HCC) 05/26/2014  . Sensory neuropathy 04/11/2014  . Muscle spasm 10/12/2013  . Inability to walk 10/12/2013  . Areflexia 10/12/2013    Past Surgical History:  Procedure Laterality Date  . ABDOMINAL  HYSTERECTOMY    . APPENDECTOMY    . REPLACEMENT TOTAL KNEE    . TONSILLECTOMY       OB History   No obstetric history on file.      Home Medications    Prior to Admission medications   Medication Sig Start Date End Date Taking? Authorizing Provider  clonazePAM (KLONOPIN) 1 MG tablet Take 1 mg by mouth 2 (two) times daily.    [provider]  DULoxetine (CYMBALTA) 60 MG capsule Take 60 mg by mouth 2 (two) times daily.    [provider]  gabapentin (NEURONTIN) 300 MG capsule Take 1 tablet bedtime for 1 week, then increase to 1 tablet twice daily. Patient not taking: Reported on 12/31/2018 04/11/14   Nita Sickle K, DO  hydrochlorothiazide (HYDRODIURIL) 25 MG tablet Take 25 mg by mouth daily.    [provider]  meloxicam (MOBIC) 15 MG tablet Take 15 mg by mouth daily.    [provider]  metFORMIN (GLUCOPHAGE) 500 MG tablet Take 500 mg by mouth daily with breakfast.    [provider]  nortriptyline (PAMELOR) 25 MG capsule Take 1 capsule (25 mg total) by mouth at bedtime. Patient not taking: Reported on 12/31/2018 02/05/14   Nita Sickle K, DO  omeprazole (PRILOSEC) 20 MG capsule Take 20 mg by mouth daily.    [provider]  ondansetron (ZOFRAN-ODT) 4 MG disintegrating tablet Take 4 mg by mouth every 8 (eight) hours as needed for nausea  or vomiting.    [provider]  predniSONE (DELTASONE) 10 MG tablet Take 40 mg daily for 1 day, 30 mg daily for 1 day, 20 mg daily for 1 days,10 mg daily for 1 day, then stop 01/04/19   Ghimire, Werner LeanShanker M, MD  rOPINIRole (REQUIP) 3 MG tablet Take 3 mg by mouth at bedtime.    [provider]    Family History Family History  Problem Relation Age of Onset  . Brain cancer Father   . Heart disease Maternal Grandmother     Social History Social History   Tobacco Use  . Smoking status: Former Smoker    Packs/day: 2.00    Years: 12.00    Pack years: 24.00    Types: Cigarettes     Quit date: 05/25/2010    Years since quitting: 8.6  . Smokeless tobacco: Never Used  Substance Use Topics  . Alcohol use: Yes    Comment: occasional  . Drug use: No     Allergies   Patient has no known allergies.   Review of Systems Review of Systems  Constitutional: Positive for activity change, appetite change, diaphoresis and fatigue. Negative for fever.  Respiratory: Negative for cough, shortness of breath and wheezing.   Cardiovascular: Negative for chest pain.  Gastrointestinal: Positive for nausea and vomiting. Negative for abdominal pain and diarrhea.  Genitourinary: Negative for dysuria and frequency.  Neurological: Positive for weakness. Negative for syncope and headaches.  Psychiatric/Behavioral: Positive for sleep disturbance.  All other systems reviewed and are negative.    Physical Exam Updated Vital Signs BP (!) 147/68 (BP Location: Left Arm)   Pulse 77   Temp 97.6 F (36.4 C) (Oral)   Resp 18   Ht 5\' 3"  (1.6 m)   Wt 102.1 kg   SpO2 96%   BMI 39.86 kg/m   Physical Exam Vitals signs and nursing note reviewed.  Constitutional:      General: She is not in acute distress.    Appearance: She is well-developed. She is obese. She is not ill-appearing.  HENT:     Head: Normocephalic and atraumatic.  Eyes:     General: No scleral icterus.       Right eye: No discharge.        Left eye: No discharge.     Conjunctiva/sclera: Conjunctivae normal.     Pupils: Pupils are equal, round, and reactive to light.  Neck:     Musculoskeletal: Normal range of motion.  Cardiovascular:     Rate and Rhythm: Normal rate and regular rhythm.  Pulmonary:     Effort: Pulmonary effort is normal. No respiratory distress.     Breath sounds: Normal breath sounds.  Abdominal:     General: There is no distension.     Palpations: Abdomen is soft.     Tenderness: There is no abdominal tenderness.  Skin:    General: Skin is warm and dry.  Neurological:     Mental Status:  She is alert and oriented to person, place, and time.  Psychiatric:        Mood and Affect: Mood normal.        Behavior: Behavior normal.      ED Treatments / Results  Labs (all labs ordered are listed, but only abnormal results are displayed) Labs Reviewed  BASIC METABOLIC PANEL - Abnormal; Notable for the following components:      Result Value   Sodium 133 (*)    Potassium 3.2 (*)  Glucose, Bld 126 (*)    BUN 7 (*)    Calcium 8.8 (*)    All other components within normal limits  CBG MONITORING, ED - Abnormal; Notable for the following components:   Glucose-Capillary 141 (*)    All other components within normal limits  CBC WITH DIFFERENTIAL/PLATELET    EKG EKG Interpretation  Date/Time:  Saturday January 26 2019 15:18:59 EDT Ventricular Rate:  76 PR Interval:    QRS Duration: 100 QT Interval:  415 QTC Calculation: 467 R Axis:   23 Text Interpretation: Sinus rhythm Baseline wander in lead(s) V2 since last tracing no significant change Confirmed by Daleen Bo [phone]) on 01/26/2019 4:11:42 PM   Radiology Dg Chest Port 1 View  Result Date: 01/26/2019 CLINICAL DATA:  Fatigue and diaphoresis. COVID-19 viral infection. EXAM: PORTABLE CHEST 1 VIEW COMPARISON:  12/31/2018 FINDINGS: The heart size and mediastinal contours are within normal limits. Pleural-parenchymal scarring again seen in the left lung base. No evidence of pulmonary infiltrate or edema. No evidence of pleural effusion. IMPRESSION: Stable pleural-parenchymal scarring in left lung base. No active disease. Electronically Signed   By: Marlaine Hind M.D.   On: 01/26/2019 16:44    Procedures Procedures (including critical care time)  Medications Ordered in ED Medications  sodium chloride 0.9 % bolus 1,000 mL (0 mLs Intravenous Stopped 01/26/19 1833)  ondansetron (ZOFRAN) injection 4 mg (4 mg Intravenous Given 01/26/19 1614)  potassium chloride SA (KLOR-CON) CR tablet 40 mEq (40 mEq Oral Given 01/26/19  1708)     Initial Impression / Assessment and Plan / ED Course  I have reviewed the triage vital signs and the nursing notes.  Pertinent labs & imaging results that were available during my care of the patient were reviewed by me and considered in my medical decision making (see chart for details).  68 year old female presents with generalized weakness, fatigue, nausea, and sweats for the past several days.  She tested positive for Covid earlier this month and was hospitalized.  She is hypertensive but otherwise vital signs are normal.  Exam is unremarkable.  Will obtain lab work, chest x-ray, urine and reevaluate.  Will give IV fluids.  Shared visit with Dr. Eulis Foster.  CBC is normal.  BMP is remarkable for mild hyponatremia (133), mild hypokalemia (3.2).  Chest x-ray is negative.  Patient informed nursing that she was ready to be discharged.  UA still pending.  She denies any urinary symptoms.  I feel is reasonable that we do not need this specially since she is already on Keflex.  She tolerated p.o.  Encouraged follow-up with her primary care provider.  Final Clinical Impressions(s) / ED Diagnoses   Final diagnoses:  Generalized weakness  Hypokalemia    ED Discharge Orders    None       Recardo Evangelist, PA-C 01/26/19 2015    Daleen Bo, MD 01/27/19 1113

## 2019-01-26 NOTE — ED Triage Notes (Signed)
Patient arrived by self from home.   Pt c/o cold sweats and fatigue since yesterday. Pt c/o decreased appetite.   Pt was admitted for COVID-19 around October 5th.   History of DM, Neuropathy.

## 2019-01-28 ENCOUNTER — Other Ambulatory Visit: Payer: Self-pay | Admitting: Orthopedic Surgery

## 2019-01-29 ENCOUNTER — Other Ambulatory Visit: Payer: Self-pay

## 2019-01-30 ENCOUNTER — Other Ambulatory Visit: Payer: Self-pay | Admitting: Sports Medicine

## 2019-01-30 ENCOUNTER — Ambulatory Visit: Payer: Self-pay | Admitting: Sports Medicine

## 2019-01-30 DIAGNOSIS — M2041 Other hammer toe(s) (acquired), right foot: Secondary | ICD-10-CM

## 2019-01-30 DIAGNOSIS — M2042 Other hammer toe(s) (acquired), left foot: Secondary | ICD-10-CM

## 2019-02-25 ENCOUNTER — Ambulatory Visit: Admit: 2019-02-25 | Payer: 59 | Admitting: Orthopedic Surgery

## 2019-02-25 SURGERY — ARTHROPLASTY, KNEE, TOTAL
Anesthesia: Spinal | Site: Knee | Laterality: Left

## 2019-05-07 ENCOUNTER — Telehealth: Payer: Self-pay

## 2019-05-07 NOTE — Telephone Encounter (Signed)
Mrs. Talerico needs an appointment (televisit or in person.) We left patient a message. KC

## 2019-05-07 NOTE — Telephone Encounter (Signed)
Hailey Greene called to request a antibiotic.  Her husband and her son tested negative for covid and she continues to have low grade fever and generally not feeling well.  She has a follow-up scheduled with you but requested that you send something in if possible.

## 2019-05-08 ENCOUNTER — Ambulatory Visit (INDEPENDENT_AMBULATORY_CARE_PROVIDER_SITE_OTHER): Payer: 59 | Admitting: Family Medicine

## 2019-05-08 VITALS — BP 122/82 | HR 88 | Temp 97.1°F | Resp 14 | Ht 62.0 in | Wt 205.0 lb

## 2019-05-08 DIAGNOSIS — J0100 Acute maxillary sinusitis, unspecified: Secondary | ICD-10-CM

## 2019-05-08 DIAGNOSIS — J019 Acute sinusitis, unspecified: Secondary | ICD-10-CM | POA: Insufficient documentation

## 2019-05-08 MED ORDER — AMOXICILLIN-POT CLAVULANATE 875-125 MG PO TABS: 1.0000 | ORAL_TABLET | Freq: Two times a day (BID) | ORAL | 0 refills | Status: DC

## 2019-05-10 LAB — HM MAMMOGRAPHY

## 2019-05-10 LAB — HM DEXA SCAN: HM Dexa Scan: NORMAL

## 2019-05-12 NOTE — Progress Notes (Signed)
Acute Office Visit  Subjective:    Patient ID: Hailey Greene, female    DOB: 08/22/1950, 69 y.o.   MRN: [id number]  Chief Complaint  Patient presents with  . Sinusitis    HPI Patient is in today for sinus symptoms.  Patient complains of sore throat, headache, stuffy nose, insomnia for 5 days.  He has vomited 7-8 times but is able to hydrate.  Her shortness of breath is at its baseline.  She had COVID-19 in the fall 2020.  She denies any loss of sense of taste or smell.  Past Medical History:  Diagnosis Date  . Chicken pox   . Depression   . Diabetes (HCC)   . Hypertension   . Neuropathy   . Tachycardia   . UTI (lower urinary tract infection)   . Weakness     Past Surgical History:  Procedure Laterality Date  . ABDOMINAL HYSTERECTOMY    . APPENDECTOMY    . REPLACEMENT TOTAL KNEE    . TONSILLECTOMY      Family History  Problem Relation Age of Onset  . Brain cancer Father   . Heart disease Maternal Grandmother     Social History   Socioeconomic History  . Marital status: Married    Spouse name: Not on file  . Number of children: 3  . Years of education: 52  . Highest education level: Not on file  Occupational History  . Occupation: Housewife  Tobacco Use  . Smoking status: Former Smoker    Packs/day: 2.00    Years: 12.00    Pack years: 24.00    Types: Cigarettes    Quit date: 05/25/2010    Years since quitting: 8.9  . Smokeless tobacco: Never Used  Substance and Sexual Activity  . Alcohol use: Yes    Comment: occasional  . Drug use: No  . Sexual activity: Not on file  Other Topics Concern  . Not on file  Social History Narrative   Born and raised in [place], [place]. Currently lives in a house with her husband. 1 dog. Fun: Garden, feed birds, swimming   Denies any religious beliefs effecting health care.    Social Determinants of Health   Financial Resource Strain:   . Difficulty of Paying Living Expenses: Not on file  Food Insecurity:   .  Worried About Programme researcher, broadcasting/film/video in the Last Year: Not on file  . Ran Out of Food in the Last Year: Not on file  Transportation Needs:   . Lack of Transportation (Medical): Not on file  . Lack of Transportation (Non-Medical): Not on file  Physical Activity:   . Days of Exercise per Week: Not on file  . Minutes of Exercise per Session: Not on file  Stress:   . Feeling of Stress : Not on file  Social Connections:   . Frequency of Communication with Friends and Family: Not on file  . Frequency of Social Gatherings with Friends and Family: Not on file  . Attends Religious Services: Not on file  . Active Member of Clubs or Organizations: Not on file  . Attends Banker Meetings: Not on file  . Marital Status: Not on file  Intimate Partner Violence:   . Fear of Current or Ex-Partner: Not on file  . Emotionally Abused: Not on file  . Physically Abused: Not on file  . Sexually Abused: Not on file    Outpatient Medications Prior to Visit  Medication Sig Dispense Refill  .  BELSOMRA 10 MG TABS Take 1 tablet by mouth at bedtime.    . clonazePAM (KLONOPIN) 1 MG tablet Take 1 mg by mouth 2 (two) times daily.    . DULoxetine (CYMBALTA) 60 MG capsule Take 60 mg by mouth 2 (two) times daily.    Marland Kitchen gabapentin (NEURONTIN) 300 MG capsule Take 1 tablet bedtime for 1 week, then increase to 1 tablet twice daily. (Patient not taking: Reported on 12/31/2018) 60 capsule 5  . hydrochlorothiazide (HYDRODIURIL) 25 MG tablet Take 25 mg by mouth daily.    . meloxicam (MOBIC) 15 MG tablet Take 15 mg by mouth daily.    . metFORMIN (GLUCOPHAGE) 500 MG tablet Take 500 mg by mouth daily with breakfast.    . nortriptyline (PAMELOR) 25 MG capsule Take 1 capsule (25 mg total) by mouth at bedtime. (Patient not taking: Reported on 12/31/2018) 30 capsule 5  . omeprazole (PRILOSEC) 20 MG capsule Take 20 mg by mouth daily.    . ondansetron (ZOFRAN) 4 MG tablet     . ondansetron (ZOFRAN-ODT) 4 MG disintegrating  tablet Take 4 mg by mouth every 8 (eight) hours as needed for nausea or vomiting.    . predniSONE (DELTASONE) 10 MG tablet Take 40 mg daily for 1 day, 30 mg daily for 1 day, 20 mg daily for 1 days,10 mg daily for 1 day, then stop 10 tablet 0  . pregabalin (LYRICA) 200 MG capsule     . rOPINIRole (REQUIP) 3 MG tablet Take 3 mg by mouth at bedtime.    . sertraline (ZOLOFT) 100 MG tablet Take 100 mg by mouth daily.    . solifenacin (VESICARE) 5 MG tablet     . SYMBICORT 160-4.5 MCG/ACT inhaler SMARTSIG:2 Puff(s) Via Inhaler Morning-Night    . traMADol (ULTRAM) 50 MG tablet     . TRINTELLIX 10 MG TABS tablet Take 10 mg by mouth daily.    . valACYclovir (VALTREX) 500 MG tablet     . amoxicillin (AMOXIL) 875 MG tablet Take 875 mg by mouth 2 (two) times daily.     No facility-administered medications prior to visit.    No Known Allergies  Review of Systems Review of Systems  Constitutional: Negative for chills, diaphoresis, fever and malaise/fatigue.  HENT: see hpi.   Respiratory: Negative for cough and sputum production.   Cardiovascular: Negative for chest pain and palpitations.  Gastrointestinal: Negative for abdominal pain, constipation, diarrhea. Musculoskeletal: Negative for myalgias.   Neurological: Negative for dizziness and headaches.     Objective:     Physical Exam VS  BP 122/82, T 97.1, P88, Wt 205 lbs, RR 14.   Physical Exam Vitals reviewed.  Constitutional:      Appearance: Normal appearance.  Neck:     Vascular: No carotid bruit.  Heent: ncat, nose edema/erythema., TMs normal, Throat - mild erythema. No exudate. BL maxillary sinus tenderness.  Cardiovascular:     Rate and Rhythm: Normal rate and regular rhythm.     Heart sounds: Normal heart sounds.  Pulmonary:     Effort: Pulmonary effort is normal.     Breath sounds: Normal breath sounds. No wheezing, rhonchi or rales.  Abdominal:     General: Bowel sounds are normal.     Palpations: Abdomen is soft.      Tenderness: There is no abdominal tenderness.  Neurological:     Mental Status: ALERT Psychiatric:        Mood and Affect: Mood normal.  Behavior: Behavior normal.    Health Maintenance Due  Topic Date Due  . Hepatitis C Screening  02/14/1951  . FOOT EXAM  05/11/1961  . OPHTHALMOLOGY EXAM  05/11/1961  . URINE MICROALBUMIN  05/11/1961  . COLONOSCOPY  05/11/2001  . DEXA SCAN  05/11/2016  . PNA vac Low Risk Adult (1 of 2 - PCV13) 05/11/2016  . INFLUENZA VACCINE  10/27/2018    There are no preventive care reminders to display for this patient.   Lab Results  Component Value Date   TSH 3.984 01/07/2014   Lab Results  Component Value Date   WBC 7.8 01/26/2019   HGB 13.3 01/26/2019   HCT 39.6 01/26/2019   MCV 86.8 01/26/2019   PLT 228 01/26/2019   Lab Results  Component Value Date   NA 133 (L) 01/26/2019   K 3.2 (L) 01/26/2019   CO2 26 01/26/2019   GLUCOSE 126 (H) 01/26/2019   BUN 7 (L) 01/26/2019   CREATININE 0.60 01/26/2019   BILITOT 0.5 01/04/2019   ALKPHOS 69 01/04/2019   AST 14 (L) 01/04/2019   ALT 19 01/04/2019   PROT 6.3 (L) 01/04/2019   ALBUMIN 3.3 (L) 01/04/2019   CALCIUM 8.8 (L) 01/26/2019   ANIONGAP 8 01/26/2019   No results found for: CHOL No results found for: HDL No results found for: LDLCALC No results found for: TRIG No results found for: CHOLHDL Lab Results  Component Value Date   HGBA1C 6.2 (H) 01/01/2019       Assessment & Plan:  Acute non-recurrent maxillary sinusitis Augmentin rx given.  Recommend rest and fluids.     Problem List Items Addressed This Visit      Respiratory   Acute non-recurrent maxillary sinusitis - Primary   Relevant Medications   amoxicillin-clavulanate (AUGMENTIN) 875-125 MG tablet   valACYclovir (VALTREX) 500 MG tablet       Meds ordered this encounter  Medications  . amoxicillin-clavulanate (AUGMENTIN) 875-125 MG tablet    Sig: Take 1 tablet by mouth 2 (two) times daily.    Dispense:  20  tablet    Refill:  0     Blane Ohara, MD

## 2019-05-14 ENCOUNTER — Telehealth: Payer: Self-pay

## 2019-05-14 NOTE — Telephone Encounter (Signed)
Attempted to contact patient, no coicemail available. Per Dr. Sedalia Muta DEXA was normal no osteoporosis.

## 2019-05-15 ENCOUNTER — Ambulatory Visit: Payer: 59 | Admitting: Family Medicine

## 2019-05-15 ENCOUNTER — Other Ambulatory Visit: Payer: Self-pay

## 2019-05-16 ENCOUNTER — Encounter: Payer: Self-pay | Admitting: Family Medicine

## 2019-05-20 ENCOUNTER — Encounter: Payer: Self-pay | Admitting: Family Medicine

## 2019-05-20 NOTE — Patient Instructions (Signed)
Acute non-recurrent maxillary sinusitis Augmentin rx given.  Recommend rest and fluids.

## 2019-05-20 NOTE — Assessment & Plan Note (Signed)
Augmentin rx given.  Recommend rest and fluids.

## 2019-05-21 NOTE — Progress Notes (Signed)
Diagnostic Mammogram has been scheduled for 05/27/2019 at 8:50am.  Hailey Greene has been called and notified of appointment date and time.

## 2019-05-23 ENCOUNTER — Encounter: Payer: Self-pay | Admitting: Family Medicine

## 2019-05-23 ENCOUNTER — Other Ambulatory Visit: Payer: Self-pay

## 2019-05-23 DIAGNOSIS — M810 Age-related osteoporosis without current pathological fracture: Secondary | ICD-10-CM

## 2019-05-24 ENCOUNTER — Encounter: Payer: Self-pay | Admitting: Family Medicine

## 2019-05-24 DIAGNOSIS — Z1231 Encounter for screening mammogram for malignant neoplasm of breast: Secondary | ICD-10-CM

## 2019-05-28 ENCOUNTER — Other Ambulatory Visit: Payer: Self-pay | Admitting: Family Medicine

## 2019-05-30 ENCOUNTER — Encounter: Payer: Self-pay | Admitting: Family Medicine

## 2019-05-31 ENCOUNTER — Ambulatory Visit (INDEPENDENT_AMBULATORY_CARE_PROVIDER_SITE_OTHER): Payer: 59 | Admitting: Family Medicine

## 2019-05-31 ENCOUNTER — Other Ambulatory Visit: Payer: Self-pay

## 2019-05-31 ENCOUNTER — Encounter: Payer: Self-pay | Admitting: Family Medicine

## 2019-05-31 ENCOUNTER — Telehealth: Payer: Self-pay

## 2019-05-31 VITALS — BP 124/68 | HR 87 | Temp 96.4°F | Ht 63.0 in | Wt 213.0 lb

## 2019-05-31 DIAGNOSIS — Z01810 Encounter for preprocedural cardiovascular examination: Secondary | ICD-10-CM

## 2019-05-31 DIAGNOSIS — J4520 Mild intermittent asthma, uncomplicated: Secondary | ICD-10-CM

## 2019-05-31 DIAGNOSIS — M1712 Unilateral primary osteoarthritis, left knee: Secondary | ICD-10-CM | POA: Insufficient documentation

## 2019-05-31 DIAGNOSIS — G603 Idiopathic progressive neuropathy: Secondary | ICD-10-CM

## 2019-05-31 DIAGNOSIS — E1142 Type 2 diabetes mellitus with diabetic polyneuropathy: Secondary | ICD-10-CM | POA: Diagnosis not present

## 2019-05-31 DIAGNOSIS — I10 Essential (primary) hypertension: Secondary | ICD-10-CM

## 2019-05-31 DIAGNOSIS — E662 Morbid (severe) obesity with alveolar hypoventilation: Secondary | ICD-10-CM

## 2019-05-31 DIAGNOSIS — Z6837 Body mass index (BMI) 37.0-37.9, adult: Secondary | ICD-10-CM

## 2019-05-31 DIAGNOSIS — Z0000 Encounter for general adult medical examination without abnormal findings: Secondary | ICD-10-CM | POA: Insufficient documentation

## 2019-05-31 HISTORY — DX: Unilateral primary osteoarthritis, left knee: M17.12

## 2019-05-31 LAB — POCT URINALYSIS DIP (CLINITEK)
Bilirubin, UA: NEGATIVE
Blood, UA: NEGATIVE
Glucose, UA: NEGATIVE mg/dL
Ketones, POC UA: NEGATIVE mg/dL
Leukocytes, UA: NEGATIVE
Nitrite, UA: NEGATIVE
POC PROTEIN,UA: NEGATIVE
Spec Grav, UA: 1.01 (ref 1.010–1.025)
Urobilinogen, UA: 0.2 E.U./dL
pH, UA: 7.5 (ref 5.0–8.0)

## 2019-05-31 LAB — POCT UA - MICROALBUMIN: Microalbumin Ur, POC: 30 mg/L

## 2019-05-31 MED ORDER — SYMBICORT 160-4.5 MCG/ACT IN AERO: 2.0000 | INHALATION_SPRAY | Freq: Two times a day (BID) | RESPIRATORY_TRACT | 3 refills | Status: DC

## 2019-05-31 MED ORDER — PHENTERMINE HCL 37.5 MG PO CAPS: 37.5000 mg | ORAL_CAPSULE | ORAL | 1 refills | Status: DC

## 2019-05-31 NOTE — Progress Notes (Signed)
Subjective:  Patient ID: Hailey Greene, female    DOB: 1950-06-27  Age: 69 y.o. MRN: [id number]  Chief Complaint  Patient presents with  . Diabetes  . Hypertension  . left knee pain    Diabetes She presents for her follow-up diabetic visit. She has type 2 diabetes mellitus. The initial diagnosis of diabetes was made 1 year ago. Her disease course has been stable. Pertinent negatives for hypoglycemia include no dizziness, headaches or nervousness/anxiousness. Associated symptoms include polydipsia. Pertinent negatives for diabetes include no chest pain, no fatigue, no polyphagia and no polyuria. (Neuropathy in feet and hands.) Current diabetic treatment includes diet (Victoza). Her overall blood glucose range is 90-110 mg/dl.  Hypertension Pertinent negatives include no chest pain, headaches or shortness of breath.  Patient's hypertension is well controlled on lisinopril 10 mg once daily and HCTZ 25 mg once daily..  Patient has asthma that is well controlled on Symbicort.  She also suffers from depression and anxiety.  She currently takes Trintellix and clonazepam.  These help.  The patient presents for preoperative clearance for left TKR. She had her right TKR done last year and did well.   She had COVID 19 in November 2020 and has recovered.    Social History   Socioeconomic History  . Marital status: Married    Spouse name: Not on file  . Number of children: 3  . Years of education: 37  . Highest education level: Not on file  Occupational History  . Occupation: Housewife  Tobacco Use  . Smoking status: Former Smoker    Packs/day: 2.00    Years: 12.00    Pack years: 24.00    Types: Cigarettes    Quit date: 05/25/2010    Years since quitting: 9.0  . Smokeless tobacco: Never Used  Substance and Sexual Activity  . Alcohol use: Yes    Comment: occasional  . Drug use: No  . Sexual activity: Not on file  Other Topics Concern  . Not on file  Social History Narrative   Born and raised in [place], [place]. Currently lives in a house with her husband. 1 dog. Fun: Garden, feed birds, swimming   Denies any religious beliefs effecting health care.    Social Determinants of Health   Financial Resource Strain:   . Difficulty of Paying Living Expenses: Not on file  Food Insecurity:   . Worried About Charity fundraiser in the Last Year: Not on file  . Ran Out of Food in the Last Year: Not on file  Transportation Needs:   . Lack of Transportation (Medical): Not on file  . Lack of Transportation (Non-Medical): Not on file  Physical Activity:   . Days of Exercise per Week: Not on file  . Minutes of Exercise per Session: Not on file  Stress:   . Feeling of Stress : Not on file  Social Connections:   . Frequency of Communication with Friends and Family: Not on file  . Frequency of Social Gatherings with Friends and Family: Not on file  . Attends Religious Services: Not on file  . Active Member of Clubs or Organizations: Not on file  . Attends Archivist Meetings: Not on file  . Marital Status: Not on file   Past Medical History:  Diagnosis Date  . Acute hypoxemic respiratory failure due to COVID-19 ([place])   . Chicken pox   . Depression   . Diabetes ([place])   . GERD (gastroesophageal reflux disease)   .  Hypertension   . Idiopathic progressive neuropathy   . Major depressive disorder   . Mixed hyperlipidemia   . Neuropathy   . Osteoarthritis   . Primary insomnia   . RLS (restless legs syndrome)   . Tachycardia   . Urge incontinence   . UTI (lower urinary tract infection)   . Weakness    Family History  Problem Relation Age of Onset  . Thyroid disease Mother   . Cancer Mother   . Migraines Mother   . Brain cancer Father   . Heart disease Maternal Grandmother   . Diabetes Daughter    Past Surgical History:  Procedure Laterality Date  . ABDOMINAL HYSTERECTOMY    . APPENDECTOMY    . CHOLECYSTECTOMY    . HERNIA REPAIR    . REPLACEMENT  TOTAL KNEE Right   . TONSILLECTOMY     Review of Systems  Constitutional: Negative for chills, fatigue and fever.       Patient is concerned about her weight and would like to try adipex again. She was only on it briefly previously, but does not report any adverse affects. The patient is unable to exercise due to her left knee pain.   HENT: Negative for congestion, ear pain and sore throat.   Respiratory: Negative for cough and shortness of breath.   Cardiovascular: Negative for chest pain.  Gastrointestinal: Negative for abdominal pain, constipation, diarrhea, nausea and vomiting.  Endocrine: Positive for polydipsia. Negative for polyphagia and polyuria.  Genitourinary: Negative for dysuria and urgency.  Musculoskeletal: Positive for arthralgias. Negative for myalgias.       Left knee.  Neurological: Negative for dizziness and headaches.  Psychiatric/Behavioral: Negative for dysphoric mood. The patient is not nervous/anxious.      Objective:  BP 124/68 (BP Location: Right Arm, Patient Position: Sitting)   Pulse 87   Temp (!) 96.4 F (35.8 C) (Temporal)   Ht 5\' 3"  (1.6 m)   Wt 213 lb (96.6 kg)   SpO2 98%   BMI 37.73 kg/m   BP/Weight 05/31/2019 05/08/2019 01/26/2019  Systolic BP 124 122 153  Diastolic BP 68 82 68  Wt. (Lbs) 213 205 225  BMI 37.73 37.49 39.86    Physical Exam Vitals reviewed.  Constitutional:      General: She is not in acute distress.    Appearance: Normal appearance. She is obese.  Eyes:     Conjunctiva/sclera: Conjunctivae normal.  Neck:     Thyroid: No thyroid mass.     Vascular: No carotid bruit.  Cardiovascular:     Rate and Rhythm: Normal rate and regular rhythm.     Pulses: Normal pulses.     Heart sounds: No murmur.  Pulmonary:     Effort: Pulmonary effort is normal.     Breath sounds: Normal breath sounds.  Abdominal:     General: Bowel sounds are normal.     Palpations: Abdomen is soft. There is no mass.     Tenderness: There is no  abdominal tenderness.  Skin:    General: Skin is warm and dry.  Neurological:     Mental Status: She is alert and oriented to person, place, and time.     Cranial Nerves: No cranial nerve deficit.  Psychiatric:        Mood and Affect: Mood normal.        Behavior: Behavior normal.     Lab Results  Component Value Date   WBC 9.0 05/31/2019   HGB 14.3 05/31/2019  HCT 43.6 05/31/2019   PLT 248 05/31/2019   GLUCOSE 98 05/31/2019   CHOL 151 05/31/2019   TRIG 220 (H) 05/31/2019   HDL 45 05/31/2019   LDLCALC 70 05/31/2019   ALT 19 01/04/2019   AST 19 05/31/2019   NA 135 05/31/2019   K 4.9 05/31/2019   CL 93 (L) 05/31/2019   CREATININE 0.89 05/31/2019   BUN 8 05/31/2019   CO2 26 01/26/2019   TSH 1.520 05/31/2019   HGBA1C 5.6 05/31/2019   MICROALBUR 30 05/31/2019      Assessment & Plan:  Benign essential HTN Well controlled.  No changes to medicines.  Continue to work on eating a healthy diet and exercise.  Labs drawn today.   Mild intermittent asthma without complication Well controlled.  No changes to medicines.   Diabetic polyneuropathy associated with type 2 diabetes mellitus (HCC) Well controlled.  No changes to medicines.  Continue to work on eating a healthy diet and exercise.  Labs drawn today.   Idiopathic progressive neuropathy Fairly well controlled.  No changes to medicines.   Primary osteoarthritis of left knee Not Cleared for knee surgery. See below.  Preoperative cardiovascular examination Not Cleared for surgery. Chest xray ordered. There was a delay in getting CXR due to patient developed acute gastroenteritis the week of  CXR showed very small infiltrate. Recommend ct scan of chest.  EKG normal Labs good.  Urinalysis normal. Chest xray ordered.   Class 2 obesity with alveolar hypoventilation, serious comorbidity, and body mass index (BMI) of 37.0 to 37.9 in adult [facility]) Start on phentermine 37.5 mg once daily. Patient has been on this  briefly in the past. Work on diet and exercise.  Education given.  Follow up in 2 months.    Meds ordered this encounter  Medications  . SYMBICORT 160-4.5 MCG/ACT inhaler    Sig: Inhale 2 puffs into the lungs 2 times daily at 12 noon and 4 pm.    Dispense:  1 Inhaler    Refill:  3  . phentermine 37.5 MG capsule    Sig: Take 1 capsule (37.5 mg total) by mouth every morning.    Dispense:  30 capsule    Refill:  1       Follow-up: Return in about 2 months (around 07/31/2019) for weight  management. .  AVS was given to patient prior to departure.  Blane Ohara [facility] [phone]

## 2019-05-31 NOTE — Patient Instructions (Addendum)
Benign essential HTN Well controlled.  No changes to medicines.  Continue to work on eating a healthy diet and exercise.  Labs drawn today.   Mild intermittent asthma without complication Well controlled.  No changes to medicines.    Diabetic polyneuropathy associated with type 2 diabetes mellitus (HCC) Well controlled.  No changes to medicines.  Continue to work on eating a healthy diet and exercise.  Labs drawn today.   Idiopathic progressive neuropathy Fairly well controlled.  No changes to medicines.    Primary osteoarthritis of left knee Cleared for knee surgery.  Preoperative cardiovascular examination Cleared for surgery.  EKG normal Labs good.  Urinalysis normal. Chest xray ordered.   Class 2 obesity with alveolar hypoventilation, serious comorbidity, and body mass index (BMI) of 37.0 to 37.9 in adult [facility]) Start on phentermine 37.5 mg once daily. Patient has been on this briefly in the past. Work on diet and exercise.  Education given.  Follow up in 2 months.      Diabetes Mellitus and Nutrition, Adult When you have diabetes (diabetes mellitus), it is very important to have healthy eating habits because your blood sugar (glucose) levels are greatly affected by what you eat and drink. Eating healthy foods in the appropriate amounts, at about the same times every day, can help you:  Control your blood glucose.  Lower your risk of heart disease.  Improve your blood pressure.  Reach or maintain a healthy weight. Every person with diabetes is different, and each person has different needs for a meal plan. Your health care provider may recommend that you work with a diet and nutrition specialist (dietitian) to make a meal plan that is best for you. Your meal plan may vary depending on factors such as:  The calories you need.  The medicines you take.  Your weight.  Your blood glucose, blood pressure, and cholesterol levels.  Your activity level.  Other  health conditions you have, such as heart or kidney disease. How do carbohydrates affect me? Carbohydrates, also called carbs, affect your blood glucose level more than any other type of food. Eating carbs naturally raises the amount of glucose in your blood. Carb counting is a method for keeping track of how many carbs you eat. Counting carbs is important to keep your blood glucose at a healthy level, especially if you use insulin or take certain oral diabetes medicines. It is important to know how many carbs you can safely have in each meal. This is different for every person. Your dietitian can help you calculate how many carbs you should have at each meal and for each snack. Foods that contain carbs include:  Bread, cereal, rice, pasta, and crackers.  Potatoes and corn.  Peas, beans, and lentils.  Milk and yogurt.  Fruit and juice.  Desserts, such as cakes, cookies, ice cream, and candy. How does alcohol affect me? Alcohol can cause a sudden decrease in blood glucose (hypoglycemia), especially if you use insulin or take certain oral diabetes medicines. Hypoglycemia can be a life-threatening condition. Symptoms of hypoglycemia (sleepiness, dizziness, and confusion) are similar to symptoms of having too much alcohol. If your health care provider says that alcohol is safe for you, follow these guidelines:  Limit alcohol intake to no more than 1 drink per day for nonpregnant women and 2 drinks per day for men. One drink equals 12 oz of beer, 5 oz of wine, or 1 oz of hard liquor.  Do not drink on an empty stomach.  Keep yourself hydrated with water, diet soda, or unsweetened iced tea.  Keep in mind that regular soda, juice, and other mixers may contain a lot of sugar and must be counted as carbs. What are tips for following this plan?  Reading food labels  Start by checking the serving size on the "Nutrition Facts" label of packaged foods and drinks. The amount of calories, carbs,  fats, and other nutrients listed on the label is based on one serving of the item. Many items contain more than one serving per package.  Check the total grams (g) of carbs in one serving. You can calculate the number of servings of carbs in one serving by dividing the total carbs by 15. For example, if a food has 30 g of total carbs, it would be equal to 2 servings of carbs.  Check the number of grams (g) of saturated and trans fats in one serving. Choose foods that have low or no amount of these fats.  Check the number of milligrams (mg) of salt (sodium) in one serving. Most people should limit total sodium intake to less than 2,300 mg per day.  Always check the nutrition information of foods labeled as "low-fat" or "nonfat". These foods may be higher in added sugar or refined carbs and should be avoided.  Talk to your dietitian to identify your daily goals for nutrients listed on the label. Shopping  Avoid buying canned, premade, or processed foods. These foods tend to be high in fat, sodium, and added sugar.  Shop around the outside edge of the grocery store. This includes fresh fruits and vegetables, bulk grains, fresh meats, and fresh dairy. Cooking  Use low-heat cooking methods, such as baking, instead of high-heat cooking methods like deep frying.  Cook using healthy oils, such as olive, canola, or sunflower oil.  Avoid cooking with butter, cream, or high-fat meats. Meal planning  Eat meals and snacks regularly, preferably at the same times every day. Avoid going long periods of time without eating.  Eat foods high in fiber, such as fresh fruits, vegetables, beans, and whole grains. Talk to your dietitian about how many servings of carbs you can eat at each meal.  Eat 4-6 ounces (oz) of lean protein each day, such as lean meat, chicken, fish, eggs, or tofu. One oz of lean protein is equal to: ? 1 oz of meat, chicken, or fish. ? 1 egg. ?  cup of tofu.  Eat some foods each  day that contain healthy fats, such as avocado, nuts, seeds, and fish. Lifestyle  Check your blood glucose regularly.  Exercise regularly as told by your health care provider. This may include: ? 150 minutes of moderate-intensity or vigorous-intensity exercise each week. This could be brisk walking, biking, or water aerobics. ? Stretching and doing strength exercises, such as yoga or weightlifting, at least 2 times a week.  Take medicines as told by your health care provider.  Do not use any products that contain nicotine or tobacco, such as cigarettes and e-cigarettes. If you need help quitting, ask your health care provider.  Work with a Social worker or diabetes educator to identify strategies to manage stress and any emotional and social challenges. Questions to ask a health care provider  Do I need to meet with a diabetes educator?  Do I need to meet with a dietitian?  What number can I call if I have questions?  When are the best times to check my blood glucose? Where to find more  information:  American Diabetes Association: diabetes.org  Academy of Nutrition and Dietetics: www.eatright.[employer] of Diabetes and Digestive and Kidney Diseases (NIH): CarFlippers.tn Summary  A healthy meal plan will help you control your blood glucose and maintain a healthy lifestyle.  Working with a diet and nutrition specialist (dietitian) can help you make a meal plan that is best for you.  Keep in mind that carbohydrates (carbs) and alcohol have immediate effects on your blood glucose levels. It is important to count carbs and to use alcohol carefully. This information is not intended to replace advice given to you by your health care provider. Make sure you discuss any questions you have with your health care provider. Document Revised: 02/24/2017 Document Reviewed: 04/18/2016 Elsevier Patient Education  2020 Elsevier Inc. DASH Eating Plan DASH stands for "Dietary  Approaches to Stop Hypertension." The DASH eating plan is a healthy eating plan that has been shown to reduce high blood pressure (hypertension). It may also reduce your risk for type 2 diabetes, heart disease, and stroke. The DASH eating plan may also help with weight loss. What are tips for following this plan?  General guidelines  Avoid eating more than 2,300 mg (milligrams) of salt (sodium) a day. If you have hypertension, you may need to reduce your sodium intake to 1,500 mg a day.  Limit alcohol intake to no more than 1 drink a day for nonpregnant women and 2 drinks a day for men. One drink equals 12 oz of beer, 5 oz of wine, or 1 oz of hard liquor.  Work with your health care provider to maintain a healthy body weight or to lose weight. Ask what an ideal weight is for you.  Get at least 30 minutes of exercise that causes your heart to beat faster (aerobic exercise) most days of the week. Activities may include walking, swimming, or biking.  Work with your health care provider or diet and nutrition specialist (dietitian) to adjust your eating plan to your individual calorie needs. Reading food labels   Check food labels for the amount of sodium per serving. Choose foods with less than 5 percent of the Daily Value of sodium. Generally, foods with less than 300 mg of sodium per serving fit into this eating plan.  To find whole grains, look for the word "whole" as the first word in the ingredient list. Shopping  Buy products labeled as "low-sodium" or "no salt added."  Buy fresh foods. Avoid canned foods and premade or frozen meals. Cooking  Avoid adding salt when cooking. Use salt-free seasonings or herbs instead of table salt or sea salt. Check with your health care provider or pharmacist before using salt substitutes.  Do not fry foods. Cook foods using healthy methods such as baking, boiling, grilling, and broiling instead.  Cook with heart-healthy oils, such as olive, canola,  soybean, or sunflower oil. Meal planning  Eat a balanced diet that includes: ? 5 or more servings of fruits and vegetables each day. At each meal, try to fill half of your plate with fruits and vegetables. ? Up to 6-8 servings of whole grains each day. ? Less than 6 oz of lean meat, poultry, or fish each day. A 3-oz serving of meat is about the same size as a deck of cards. One egg equals 1 oz. ? 2 servings of low-fat dairy each day. ? A serving of nuts, seeds, or beans 5 times each week. ? Heart-healthy fats. Healthy fats called Omega-3 fatty acids are  found in foods such as flaxseeds and coldwater fish, like sardines, salmon, and mackerel.  Limit how much you eat of the following: ? Canned or prepackaged foods. ? Food that is high in trans fat, such as fried foods. ? Food that is high in saturated fat, such as fatty meat. ? Sweets, desserts, sugary drinks, and other foods with added sugar. ? Full-fat dairy products.  Do not salt foods before eating.  Try to eat at least 2 vegetarian meals each week.  Eat more home-cooked food and less restaurant, buffet, and fast food.  When eating at a restaurant, ask that your food be prepared with less salt or no salt, if possible. What foods are recommended? The items listed may not be a complete list. Talk with your dietitian about what dietary choices are best for you. Grains Whole-grain or whole-wheat bread. Whole-grain or whole-wheat pasta. Brown rice. Orpah Cobb. Bulgur. Whole-grain and low-sodium cereals. Pita bread. Low-fat, low-sodium crackers. Whole-wheat flour tortillas. Vegetables Fresh or frozen vegetables (raw, steamed, roasted, or grilled). Low-sodium or reduced-sodium tomato and vegetable juice. Low-sodium or reduced-sodium tomato sauce and tomato paste. Low-sodium or reduced-sodium canned vegetables. Fruits All fresh, dried, or frozen fruit. Canned fruit in natural juice (without added sugar). Meat and other protein  foods Skinless chicken or [place]. Ground chicken or [place]. Pork with fat trimmed off. Fish and seafood. Egg whites. Dried beans, peas, or lentils. Unsalted nuts, nut butters, and seeds. Unsalted canned beans. Lean cuts of beef with fat trimmed off. Low-sodium, lean deli meat. Dairy Low-fat (1%) or fat-free (skim) milk. Fat-free, low-fat, or reduced-fat cheeses. Nonfat, low-sodium ricotta or cottage cheese. Low-fat or nonfat yogurt. Low-fat, low-sodium cheese. Fats and oils Soft margarine without trans fats. Vegetable oil. Low-fat, reduced-fat, or light mayonnaise and salad dressings (reduced-sodium). Canola, safflower, olive, soybean, and sunflower oils. Avocado. Seasoning and other foods Herbs. Spices. Seasoning mixes without salt. Unsalted popcorn and pretzels. Fat-free sweets. What foods are not recommended? The items listed may not be a complete list. Talk with your dietitian about what dietary choices are best for you. Grains Baked goods made with fat, such as croissants, muffins, or some breads. Dry pasta or rice meal packs. Vegetables Creamed or fried vegetables. Vegetables in a cheese sauce. Regular canned vegetables (not low-sodium or reduced-sodium). Regular canned tomato sauce and paste (not low-sodium or reduced-sodium). Regular tomato and vegetable juice (not low-sodium or reduced-sodium). Rosita Fire. Olives. Fruits Canned fruit in a light or heavy syrup. Fried fruit. Fruit in cream or butter sauce. Meat and other protein foods Fatty cuts of meat. Ribs. Fried meat. Tomasa Blase. Sausage. Bologna and other processed lunch meats. Salami. Fatback. Hotdogs. Bratwurst. Salted nuts and seeds. Canned beans with added salt. Canned or smoked fish. Whole eggs or egg yolks. Chicken or [place] with skin. Dairy Whole or 2% milk, cream, and half-and-half. Whole or full-fat cream cheese. Whole-fat or sweetened yogurt. Full-fat cheese. Nondairy creamers. Whipped toppings. Processed cheese and cheese  spreads. Fats and oils Butter. Stick margarine. Lard. Shortening. Ghee. Bacon fat. Tropical oils, such as coconut, palm kernel, or palm oil. Seasoning and other foods Salted popcorn and pretzels. Onion salt, garlic salt, seasoned salt, table salt, and sea salt. Worcestershire sauce. Tartar sauce. Barbecue sauce. Teriyaki sauce. Soy sauce, including reduced-sodium. Steak sauce. Canned and packaged gravies. Fish sauce. Oyster sauce. Cocktail sauce. Horseradish that you find on the shelf. Ketchup. Mustard. Meat flavorings and tenderizers. Bouillon cubes. Hot sauce and Tabasco sauce. Premade or packaged marinades. Premade or packaged taco seasonings.  Relishes. Regular salad dressings. Where to find more information:  National Heart, Lung, and Blood Institute: PopSteam.is  American Heart Association: www.heart.org Summary  The DASH eating plan is a healthy eating plan that has been shown to reduce high blood pressure (hypertension). It may also reduce your risk for type 2 diabetes, heart disease, and stroke.  With the DASH eating plan, you should limit salt (sodium) intake to 2,300 mg a day. If you have hypertension, you may need to reduce your sodium intake to 1,500 mg a day.  When on the DASH eating plan, aim to eat more fresh fruits and vegetables, whole grains, lean proteins, low-fat dairy, and heart-healthy fats.  Work with your health care provider or diet and nutrition specialist (dietitian) to adjust your eating plan to your individual calorie needs. This information is not intended to replace advice given to you by your health care provider. Make sure you discuss any questions you have with your health care provider. Document Revised: 02/24/2017 Document Reviewed: 03/07/2016 Elsevier Patient Education  2020 [employer].

## 2019-06-01 LAB — LIPID PANEL
Chol/HDL Ratio: 3.4 ratio (ref 0.0–4.4)
Cholesterol, Total: 151 mg/dL (ref 100–199)
HDL: 45 mg/dL (ref >39)
LDL Chol Calc (NIH): 70 mg/dL (ref 0–99)
Triglycerides: 220 mg/dL — ABNORMAL HIGH (ref 0–149)
VLDL Cholesterol Cal: 36 mg/dL (ref 5–40)

## 2019-06-01 LAB — COMP. METABOLIC PANEL (12)
AST: 19 IU/L (ref 0–40)
Albumin/Globulin Ratio: 1.6 (ref 1.2–2.2)
Albumin: 4 g/dL (ref 3.8–4.8)
Alkaline Phosphatase: 117 IU/L (ref 39–117)
BUN/Creatinine Ratio: 9 — ABNORMAL LOW (ref 12–28)
BUN: 8 mg/dL (ref 8–27)
Bilirubin Total: 0.3 mg/dL (ref 0.0–1.2)
Calcium: 9.5 mg/dL (ref 8.7–10.3)
Chloride: 93 mmol/L — ABNORMAL LOW (ref 96–106)
Creatinine, Ser: 0.89 mg/dL (ref 0.57–1.00)
GFR calc Af Amer: 77 mL/min/{1.73_m2} (ref >59)
GFR calc non Af Amer: 67 mL/min/{1.73_m2} (ref >59)
Globulin, Total: 2.5 g/dL (ref 1.5–4.5)
Glucose: 98 mg/dL (ref 65–99)
Potassium: 4.9 mmol/L (ref 3.5–5.2)
Sodium: 135 mmol/L (ref 134–144)
Total Protein: 6.5 g/dL (ref 6.0–8.5)

## 2019-06-01 LAB — CBC WITH DIFFERENTIAL/PLATELET
Basophils Absolute: 0.1 10*3/uL (ref 0.0–0.2)
Basos: 1 %
EOS (ABSOLUTE): 0.2 10*3/uL (ref 0.0–0.4)
Eos: 2 %
Hematocrit: 43.6 % (ref 34.0–46.6)
Hemoglobin: 14.3 g/dL (ref 11.1–15.9)
Immature Grans (Abs): 0.1 10*3/uL (ref 0.0–0.1)
Immature Granulocytes: 1 %
Lymphocytes Absolute: 1.9 10*3/uL (ref 0.7–3.1)
Lymphs: 21 %
MCH: 29.4 pg (ref 26.6–33.0)
MCHC: 32.8 g/dL (ref 31.5–35.7)
MCV: 90 fL (ref 79–97)
Monocytes Absolute: 0.6 10*3/uL (ref 0.1–0.9)
Monocytes: 7 %
Neutrophils Absolute: 6.2 10*3/uL (ref 1.4–7.0)
Neutrophils: 68 %
Platelets: 248 10*3/uL (ref 150–450)
RBC: 4.87 x10E6/uL (ref 3.77–5.28)
RDW: 12.5 % (ref 11.7–15.4)
WBC: 9 10*3/uL (ref 3.4–10.8)

## 2019-06-01 LAB — TSH: TSH: 1.52 u[IU]/mL (ref 0.450–4.500)

## 2019-06-01 LAB — HEMOGLOBIN A1C
Est. average glucose Bld gHb Est-mCnc: 114 mg/dL
Hgb A1c MFr Bld: 5.6 % (ref 4.8–5.6)

## 2019-06-01 LAB — CARDIOVASCULAR RISK ASSESSMENT

## 2019-06-01 LAB — URINE CULTURE: Organism ID, Bacteria: NO GROWTH

## 2019-06-02 ENCOUNTER — Encounter: Payer: Self-pay | Admitting: Family Medicine

## 2019-06-02 DIAGNOSIS — Z6836 Body mass index (BMI) 36.0-36.9, adult: Secondary | ICD-10-CM | POA: Insufficient documentation

## 2019-06-02 DIAGNOSIS — J4520 Mild intermittent asthma, uncomplicated: Secondary | ICD-10-CM | POA: Insufficient documentation

## 2019-06-02 DIAGNOSIS — E662 Morbid (severe) obesity with alveolar hypoventilation: Secondary | ICD-10-CM | POA: Insufficient documentation

## 2019-06-02 LAB — MRSA CULTURE: MRSA Screen: NEGATIVE

## 2019-06-02 NOTE — Assessment & Plan Note (Signed)
Fairly well controlled.  No changes to medicines.   

## 2019-06-02 NOTE — Assessment & Plan Note (Signed)
Well controlled.  ?No changes to medicines.  ?Continue to work on eating a healthy diet and exercise.  ?Labs drawn today.  ?

## 2019-06-02 NOTE — Assessment & Plan Note (Signed)
Start on phentermine 37.5 mg once daily. Patient has been on this briefly in the past. Work on diet and exercise.  Education given.  Follow up in 2 months.

## 2019-06-02 NOTE — Assessment & Plan Note (Signed)
Cleared for knee surgery.

## 2019-06-02 NOTE — Assessment & Plan Note (Signed)
Well controlled.  No changes to medicines.  .   

## 2019-06-02 NOTE — Assessment & Plan Note (Signed)
Cleared for surgery.  EKG normal Labs good.  Urinalysis normal. Chest xray ordered.

## 2019-06-04 ENCOUNTER — Telehealth: Payer: Self-pay

## 2019-06-04 ENCOUNTER — Ambulatory Visit: Payer: 59 | Admitting: Family Medicine

## 2019-06-04 NOTE — Telephone Encounter (Signed)
Patient needs increased hydration. Tylenol for aching.

## 2019-06-04 NOTE — Telephone Encounter (Signed)
Mrs. Sammuel Bailiff called with c/o nausea and diarrhea.  She has been having chills but no fever since Friday.  She did receive her 1st covid vaccine last Monday but she did not experience any adverse symptoms during the first 48-72 hours.  She is concerned that she has the flu.

## 2019-06-04 NOTE — Telephone Encounter (Signed)
Patient informed. 

## 2019-06-06 ENCOUNTER — Telehealth (INDEPENDENT_AMBULATORY_CARE_PROVIDER_SITE_OTHER): Payer: 59 | Admitting: Family Medicine

## 2019-06-06 ENCOUNTER — Encounter: Payer: Self-pay | Admitting: Family Medicine

## 2019-06-06 VITALS — BP 137/76 | HR 72 | Temp 98.7°F | Ht 67.0 in | Wt 210.0 lb

## 2019-06-06 DIAGNOSIS — R197 Diarrhea, unspecified: Secondary | ICD-10-CM | POA: Diagnosis not present

## 2019-06-06 MED ORDER — ONDANSETRON HCL 4 MG PO TABS: ORAL_TABLET | ORAL | 0 refills | Status: DC

## 2019-06-06 NOTE — Progress Notes (Signed)
Virtual Visit via Telephone Note   This visit type was conducted due to national recommendations for restrictions regarding the COVID-19 Pandemic (e.g. social distancing) in an effort to limit this patient's exposure and mitigate transmission in our community.  Due to her co-morbid illnesses, this patient is at least at moderate risk for complications without adequate follow up.  This format is felt to be most appropriate for this patient at this time.  The patient did not have access to video technology/had technical difficulties with video requiring transitioning to audio format only (telephone).  All issues noted in this document were discussed and addressed.  No physical exam could be performed with this format.  Patient verbally consented to a telehealth visit.   Date:  06/06/2019   ID:  Hailey Greene, DOB [birth year], MRN [id number]  Patient Location: Home Provider Location: Office  PCP:  Rochel Brome, MD   Evaluation Performed:  Follow-Up Visit  Chief Complaint:  diarrhea  History of Present Illness:    Hailey Greene is a 69 y.o. female with nausea and diarrhea. Having 3-4 stools daily (water).  Experiencing abdominal cramping. No vomiting. No dysuria or increase frequency. Patient is drinking water or 7 up. No food. Taking emetrol for nausea, but not helping. Also has tried phenergan without relief.  The patient does not have symptoms concerning for COVID-19 infection (fever, chills, cough, or new shortness of breath).     Past Medical History:  Diagnosis Date  . Acute hypoxemic respiratory failure due to COVID-19 ([place])   . Chicken pox   . Depression   . Diabetes ([place])   . GERD (gastroesophageal reflux disease)   . Hypertension   . Idiopathic progressive neuropathy   . Major depressive disorder   . Mixed hyperlipidemia   . Neuropathy   . Osteoarthritis   . Primary insomnia   . RLS (restless legs syndrome)   . Tachycardia   . Urge incontinence   . UTI (lower  urinary tract infection)   . Weakness    Past Surgical History:  Procedure Laterality Date  . ABDOMINAL HYSTERECTOMY    . APPENDECTOMY    . CHOLECYSTECTOMY    . HERNIA REPAIR    . REPLACEMENT TOTAL KNEE Right   . TONSILLECTOMY       No outpatient medications have been marked as taking for the 06/06/19 encounter (Video Visit) with Rochel Brome, MD.     Allergies:   Patient has no known allergies.   Social History   Tobacco Use  . Smoking status: Former Smoker    Packs/day: 2.00    Years: 12.00    Pack years: 24.00    Types: Cigarettes    Quit date: 05/25/2010    Years since quitting: 9.0  . Smokeless tobacco: Never Used  Substance Use Topics  . Alcohol use: Yes    Comment: occasional  . Drug use: No     Family Hx: The patient's family history includes Brain cancer in her father; Cancer in her mother; Diabetes in her daughter; Heart disease in her maternal grandmother; Migraines in her mother; Thyroid disease in her mother.  ROS:   Review of Systems  Constitutional: Negative for chills, fever. Very malaise/fatigue.  HENT: Negative for congestion, ear pain and sore throat.   Respiratory: Negative for cough and shortness of breath  Cardiovascular: Negative for chest pain and palpitations.  Gastrointestinal: See HPI Genitourinary: Negative for dysuria and urgency.  Musculoskeletal: Achy.  Psychiatric/Behavioral: Negative for depression. Negative for  anhedonia. Negative for anxiety.     Labs/Other Tests and Data Reviewed:    Recent Labs: 01/04/2019: ALT 19; B Natriuretic Peptide 57.9; Magnesium 2.1 05/31/2019: BUN 8; Creatinine, Ser 0.89; Hemoglobin 14.3; Platelets 248; Potassium 4.9; Sodium 135; TSH 1.520   Recent Lipid Panel Lab Results  Component Value Date/Time   CHOL 151 05/31/2019 11:20 AM   TRIG 220 (H) 05/31/2019 11:20 AM   HDL 45 05/31/2019 11:20 AM   CHOLHDL 3.4 05/31/2019 11:20 AM   LDLCALC 70 05/31/2019 11:20 AM    Wt Readings from Last 3 Encounters:    06/06/19 210 lb (95.3 kg)  05/31/19 213 lb (96.6 kg)  05/08/19 205 lb (93 kg)     Objective:    Vital Signs:  BP 137/76   Pulse 72   Temp 98.7 F (37.1 C)   Ht 5\' 7"  (1.702 m)   Wt 210 lb (95.3 kg)   SpO2 98%   BMI 32.89 kg/m    VITAL SIGNS:  reviewed. No acute distress on phone.  ASSESSMENT & PLAN:   Diarrhea of presumed infectious origin Push fluids. Recommend bland diet.  Ordered ondansetron for nausea. Ordering stool studies - husband to come and pick up.   COVID-19 Education: The signs and symptoms of COVID-19 were discussed with the patient and how to seek care for testing (follow up with PCP or arrange E-visit). The importance of social distancing was discussed today.  Time:   Today, I have spent 10 minutes with the patient with telehealth technology discussing the above problems.     Medication Adjustments/Labs and Tests Ordered: Current medicines are reviewed at length with the patient today.  Concerns regarding medicines are outlined above.   Tests Ordered: Orders Placed This Encounter  Procedures  . Cdiff NAA+O+P+Stool Culture    Medication Changes: Meds ordered this encounter  Medications  . ondansetron (ZOFRAN) 4 MG tablet    Sig: TAKE ONE TABLET BY MOUTH EVERY EIGHT HOURS AS NEEDED NAUSEA    Dispense:  90 tablet    Refill:  0    Follow Up:  In Person prn  Signed, , MD  06/06/2019 3:10 PM    [facility] [place]

## 2019-06-06 NOTE — Telephone Encounter (Signed)
error 

## 2019-06-06 NOTE — Assessment & Plan Note (Signed)
Push fluids. Recommend bland diet.  Ordered ondansetron for nausea.

## 2019-06-06 NOTE — Patient Instructions (Signed)
Onsdansetron Rx sent for nausea. Ordered stool studies.  Diarrhea, Adult Diarrhea is when you pass loose and watery poop (stool) often. Diarrhea can make you feel weak and cause you to lose water in your body (get dehydrated). Losing water in your body can cause you to:  Feel tired and thirsty.  Have a dry mouth.  Go pee (urinate) less often. Diarrhea often lasts 2-3 days. However, it can last longer if it is a sign of something more serious. It is important to treat your diarrhea as told by your doctor. Follow these instructions at home: Eating and drinking     Follow these instructions as told by your doctor:  Take an ORS (oral rehydration solution). This is a drink that helps you replace fluids and minerals your body lost. It is sold at pharmacies and stores.  Drink plenty of fluids, such as: ? Water. ? Ice chips. ? Diluted fruit juice. ? Low-calorie sports drinks. ? Milk, if you want.  Avoid drinking fluids that have a lot of sugar or caffeine in them.  Eat bland, easy-to-digest foods in small amounts as you are able. These foods include: ? Bananas. ? Applesauce. ? Rice. ? Low-fat (lean) meats. ? Toast. ? Crackers.  Avoid alcohol.  Avoid spicy or fatty foods.  Medicines  Take over-the-counter and prescription medicines only as told by your doctor.  If you were prescribed an antibiotic medicine, take it as told by your doctor. Do not stop using the antibiotic even if you start to feel better. General instructions   Wash your hands often using soap and water. If soap and water are not available, use a hand sanitizer. Others in your home should wash their hands as well. Hands should be washed: ? After using the toilet or changing a diaper. ? Before preparing, cooking, or serving food. ? While caring for a sick person. ? While visiting someone in a hospital.  Drink enough fluid to keep your pee (urine) pale yellow.  Rest at home while you get better.  Watch  your condition for any changes.  Take a warm bath to help with any burning or pain from having diarrhea.  Keep all follow-up visits as told by your doctor. This is important. Contact a doctor if:  You have a fever.  Your diarrhea gets worse.  You have new symptoms.  You cannot keep fluids down.  You feel light-headed or dizzy.  You have a headache.  You have muscle cramps. Get help right away if:  You have chest pain.  You feel very weak or you pass out (faint).  You have bloody or black poop or poop that looks like tar.  You have very bad pain, cramping, or bloating in your belly (abdomen).  You have trouble breathing or you are breathing very quickly.  Your heart is beating very quickly.  Your skin feels cold and clammy.  You feel confused.  You have signs of losing too much water in your body, such as: ? Dark pee, very little pee, or no pee. ? Cracked lips. ? Dry mouth. ? Sunken eyes. ? Sleepiness. ? Weakness. Summary  Diarrhea is when you pass loose and watery poop (stool) often.  Diarrhea can make you feel weak and cause you to lose water in your body (get dehydrated).  Take an ORS (oral rehydration solution). This is a drink that is sold at pharmacies and stores.  Eat bland, easy-to-digest foods in small amounts as you are able.  Contact a  doctor if your condition gets worse. Get help right away if you have signs that you have lost too much water in your body. This information is not intended to replace advice given to you by your health care provider. Make sure you discuss any questions you have with your health care provider. Document Revised: 08/18/2017 Document Reviewed: 08/18/2017 Elsevier Patient Education  [place].

## 2019-06-10 ENCOUNTER — Other Ambulatory Visit: Payer: Self-pay | Admitting: Family Medicine

## 2019-06-10 ENCOUNTER — Other Ambulatory Visit: Payer: Self-pay

## 2019-06-10 MED ORDER — LEVOFLOXACIN 500 MG PO TABS: 500.0000 mg | ORAL_TABLET | Freq: Every day | ORAL | 0 refills | Status: DC

## 2019-06-11 ENCOUNTER — Other Ambulatory Visit: Payer: Self-pay | Admitting: Family Medicine

## 2019-06-13 ENCOUNTER — Other Ambulatory Visit: Payer: Self-pay

## 2019-06-13 ENCOUNTER — Other Ambulatory Visit: Payer: Self-pay | Admitting: Family Medicine

## 2019-06-13 DIAGNOSIS — Z01810 Encounter for preprocedural cardiovascular examination: Secondary | ICD-10-CM

## 2019-06-13 DIAGNOSIS — E1142 Type 2 diabetes mellitus with diabetic polyneuropathy: Secondary | ICD-10-CM

## 2019-06-21 ENCOUNTER — Other Ambulatory Visit: Payer: Self-pay | Admitting: Legal Medicine

## 2019-06-21 ENCOUNTER — Telehealth: Payer: Self-pay

## 2019-06-21 NOTE — Telephone Encounter (Signed)
Hailey Greene called requesting chest xray results.  Dr. Marina Goodell reviewed the results with suspected persistent small focus of pneumonia in the anterior right upper lobe.  He is going to let Dr. Sedalia Muta decide if further medication is needed.  He does not recommend surgery until xray is cleared.

## 2019-06-23 NOTE — Telephone Encounter (Signed)
Recommend ct scan of lungs to better characterize pulmonary infiltrate. Kc

## 2019-06-24 NOTE — Telephone Encounter (Signed)
Patient informed.  She is willing to proceed with referral for CT.

## 2019-06-26 ENCOUNTER — Encounter: Payer: Self-pay | Admitting: Family Medicine

## 2019-06-26 ENCOUNTER — Other Ambulatory Visit: Payer: Self-pay | Admitting: Family Medicine

## 2019-06-26 DIAGNOSIS — R0602 Shortness of breath: Secondary | ICD-10-CM

## 2019-06-27 ENCOUNTER — Other Ambulatory Visit: Payer: Self-pay | Admitting: Family Medicine

## 2019-06-27 ENCOUNTER — Encounter: Payer: Self-pay | Admitting: Family Medicine

## 2019-07-05 ENCOUNTER — Other Ambulatory Visit: Payer: Self-pay | Admitting: Orthopedic Surgery

## 2019-07-08 ENCOUNTER — Other Ambulatory Visit: Payer: Self-pay | Admitting: Family Medicine

## 2019-07-09 NOTE — Patient Instructions (Signed)
DUE TO COVID-19 ONLY ONE VISITOR IS ALLOWED TO COME WITH YOU AND STAY IN THE WAITING ROOM ONLY DURING PRE OP AND PROCEDURE DAY OF SURGERY. THE 1 VISITOR MAY VISIT WITH YOU AFTER SURGERY IN YOUR PRIVATE ROOM DURING VISITING HOURS ONLY!  YOU NEED TO HAVE A COVID 19 TEST ON 07/11/2019 @ 1:10 pm, THIS TEST MUST BE DONE BEFORE SURGERY, COME  [address], [place] , [address].  [facility]) ONCE YOUR COVID TEST IS COMPLETED, PLEASE BEGIN THE QUARANTINE INSTRUCTIONS AS OUTLINED IN YOUR HANDOUT.                TILA MILLIRONS  07/09/2019   Your procedure is scheduled on: Monday 07/15/2019   Report to [facility] Main  Entrance   Report to admitting at 0700     Call this number if you have problems the morning of surgery [phone]    Remember: Do not eat food after midnight; clear liquids from midnight till 0630 day of surgery consuming entire G2 ERAS/presurgery drink 0630 then nothing by mouth. BRUSH YOUR TEETH MORNING OF SURGERY AND RINSE YOUR MOUTH OUT, NO CHEWING GUM CANDY OR MINTS.     Take these medicines the morning of surgery with A SIP OF WATER: Duloxetine(Cymbalta); Omeprazole (Prilosec); Lyrica; Vesicare; Symbicort Inhaler; Trintellix  DO NOT TAKE ANY DIABETIC MEDICATIONS DAY OF YOUR SURGERY                               You may not have any metal on your body including hair pins and              piercings  Do not wear jewelry, make-up, lotions, powders or perfumes, deodorant             Do not wear nail polish on your fingernails.  Do not shave  48 hours prior to surgery.               Do not bring valuables to the hospital. [facility] IS NOT             RESPONSIBLE   FOR VALUABLES.  Contacts, dentures or bridgework may not be worn into surgery.  Leave suitcase in the car. After surgery it may be brought to your room.     Patients discharged the day of surgery will not be allowed to drive home. IF YOU ARE HAVING SURGERY AND GOING HOME THE SAME  DAY, YOU MUST HAVE AN ADULT TO DRIVE YOU HOME AND BE WITH YOU FOR 24 HOURS. YOU MAY GO HOME BY TAXI OR UBER OR ORTHERWISE, BUT AN ADULT MUST ACCOMPANY YOU HOME AND STAY WITH YOU FOR 24 HOURS.   _____________________________________________________________________             [facility] - Preparing for Surgery Before surgery, you can play an important role.  Because skin is not sterile, your skin needs to be as free of germs as possible.  You can reduce the number of germs on your skin by washing with CHG (chlorahexidine gluconate) soap before surgery.  CHG is an antiseptic cleaner which kills germs and bonds with the skin to continue killing germs even after washing. Please DO NOT use if you have an allergy to CHG or antibacterial soaps.  If your skin becomes reddened/irritated stop using the CHG and inform your nurse when you arrive at Short Stay. Do not shave (including legs and  underarms) for at least 48 hours prior to the first CHG shower.  You may shave your face/neck. Please follow these instructions carefully:  1.  Shower with CHG Soap the night before surgery and the  morning of Surgery.  2.  If you choose to wash your hair, wash your hair first as usual with your  normal  shampoo.  3.  After you shampoo, rinse your hair and body thoroughly to remove the  shampoo.                           4.  Use CHG as you would any other liquid soap.  You can apply chg directly  to the skin and wash                       Gently with a scrungie or clean washcloth.  5.  Apply the CHG Soap to your body ONLY FROM THE NECK DOWN.   Do not use on face/ open                           Wound or open sores. Avoid contact with eyes, ears mouth and genitals (private parts).                       Wash face,  Genitals (private parts) with your normal soap.             6.  Wash thoroughly, paying special attention to the area where your surgery  will be performed.  7.  Thoroughly rinse your body with warm water from  the neck down.  8.  DO NOT shower/wash with your normal soap after using and rinsing off  the CHG Soap.                9.  Pat yourself dry with a clean towel.            10.  Wear clean pajamas.            11.  Place clean sheets on your bed the night of your first shower and do not  sleep with pets. Day of Surgery : Do not apply any lotions/deodorants the morning of surgery.  Please wear clean clothes to the hospital/surgery center.  FAILURE TO FOLLOW THESE INSTRUCTIONS MAY RESULT IN THE CANCELLATION OF YOUR SURGERY PATIENT SIGNATURE_________________________________  NURSE SIGNATURE__________________________________  ________________________________________________________________________    CLEAR LIQUID DIET   Foods Allowed                                                                     Foods Excluded  Coffee and tea, regular and decaf                             liquids that you cannot  Plain Jell-O any favor except red or purple  see through such as: Fruit ices (not with fruit pulp)                                     milk, soups, orange juice  Iced Popsicles                                    All solid food Carbonated beverages, regular and diet                                    Cranberry, grape and apple juices Sports drinks like Gatorade Lightly seasoned clear broth or consume(fat free) Sugar, honey syrup  Sample Menu Breakfast                                Lunch                                     Supper Cranberry juice                    Beef broth                            Chicken broth Jell-O                                     Grape juice                           Apple juice Coffee or tea                        Jell-O                                      Popsicle                                                Coffee or tea                        Coffee or  tea  _____________________________________________________________________     CLEAR LIQUID DIET   Foods Allowed                                                                     Foods Excluded  Coffee and tea, regular and decaf  liquids that you cannot  Plain Jell-O any favor except red or purple                                           see through such as: Fruit ices (not with fruit pulp)                                     milk, soups, orange juice  Iced Popsicles                                    All solid food Carbonated beverages, regular and diet                                    Cranberry, grape and apple juices Sports drinks like Gatorade Lightly seasoned clear broth or consume(fat free) Sugar, honey syrup  Sample Menu Breakfast                                Lunch                                     Supper Cranberry juice                    Beef broth                            Chicken broth Jell-O                                     Grape juice                           Apple juice Coffee or tea                        Jell-O                                      Popsicle                                                Coffee or tea                        Coffee or tea  _____________________________________________________________________ How to Manage Your Diabetes Before and After Surgery  Why is it important to control my blood sugar before and after surgery? . Improving blood sugar levels before and after surgery helps healing and can limit problems. . A way of improving blood sugar control is eating a healthy diet by: o  Eating less sugar and carbohydrates o  Increasing activity/exercise o  Talking with your doctor about reaching your blood sugar goals . High blood sugars (greater than 180 mg/dL) can raise your risk of infections and slow your recovery, so you will need to focus on controlling your diabetes during the weeks before  surgery. . Make sure that the doctor who takes care of your diabetes knows about your planned surgery including the date and location.  How do I manage my blood sugar before surgery? . Check your blood sugar at least 4 times a day, starting 2 days before surgery, to make sure that the level is not too high or low. o Check your blood sugar the morning of your surgery when you wake up and every 2 hours until you get to the Short Stay unit. . If your blood sugar is less than 70 mg/dL, you will need to treat for low blood sugar: o Do not take insulin. o Treat a low blood sugar (less than 70 mg/dL) with  cup of clear juice (cranberry or apple), 4 glucose tablets, OR glucose gel. o Recheck blood sugar in 15 minutes after treatment (to make sure it is greater than 70 mg/dL). If your blood sugar is not greater than 70 mg/dL on recheck, call [phone] for further instructions. . Report your blood sugar to the short stay nurse when you get to Short Stay.  . If you are admitted to the hospital after surgery: o Your blood sugar will be checked by the staff and you will probably be given insulin after surgery (instead of oral diabetes medicines) to make sure you have good blood sugar levels. o The goal for blood sugar control after surgery is 80-180 mg/dL.   WHAT DO I DO ABOUT MY DIABETES MEDICATION?  Marland Kitchen. Do not take oral diabetes medicines (pills) the morning of surgery.  . THE NIGHT BEFORE SURGERY, take     units of       insulin.       . THE MORNING OF SURGERY, take   units of         insulin.  . The day of surgery, do not take other diabetes injectables, including Byetta (exenatide), Bydureon (exenatide ER), Victoza (liraglutide), or Trulicity (dulaglutide).  . If your CBG is greater than 220 mg/dL, you may take  of your sliding scale  . (correction) dose of insulin.    For patients with insulin pumps: Contact your diabetes doctor for specific instructions before surgery. Decrease basal  rates by 20% at midnight the night before your surgery. Note that if your surgery is planned to be longer than 2 hours, your insulin pump will be removed and intravenous (IV) insulin will be started and managed by the nurses and the anesthesiologist. You will be able to restart your insulin pump once you are awake and able to manage it.  Make sure to bring insulin pump supplies to the hospital with you in case the  site needs to be changed.  Patient Signature:  Date:   Nurse Signature:  Date:   Reviewed and Endorsed by [facility] Patient Education Committee, August 2015

## 2019-07-09 NOTE — Progress Notes (Signed)
LVMM with pts primary MD / Dr Mcneil Sober nurse Rayfield Citizen in regards to if pt had CT of lung preformed and questioning clearance for upcoming surgery scheduled for Monday 07/15/2019.

## 2019-07-09 NOTE — Progress Notes (Signed)
Spoke with Dr Tobin Chad office in regards to pts need for CT lung prior to receiving clearance. Office faxing clearance and updated report.

## 2019-07-11 ENCOUNTER — Encounter (HOSPITAL_COMMUNITY)
Admission: RE | Admit: 2019-07-11 | Discharge: 2019-07-11 | Disposition: A | Discharge status: Home / Self Care | Payer: 59 | Source: Ambulatory Visit | Attending: Orthopedic Surgery | Admitting: Orthopedic Surgery

## 2019-07-11 ENCOUNTER — Other Ambulatory Visit: Payer: Self-pay

## 2019-07-11 ENCOUNTER — Other Ambulatory Visit (HOSPITAL_COMMUNITY)
Admission: RE | Admit: 2019-07-11 | Discharge: 2019-07-11 | Disposition: A | Discharge status: Home / Self Care | Payer: 59 | Source: Ambulatory Visit | Attending: Orthopedic Surgery | Admitting: Orthopedic Surgery

## 2019-07-11 ENCOUNTER — Encounter (HOSPITAL_COMMUNITY): Payer: Self-pay

## 2019-07-11 DIAGNOSIS — Z20822 Contact with and (suspected) exposure to covid-19: Secondary | ICD-10-CM | POA: Insufficient documentation

## 2019-07-11 DIAGNOSIS — Z01812 Encounter for preprocedural laboratory examination: Secondary | ICD-10-CM | POA: Diagnosis not present

## 2019-07-11 HISTORY — DX: Pneumonia, unspecified organism: J18.9

## 2019-07-11 LAB — COMPREHENSIVE METABOLIC PANEL
ALT: 13 U/L (ref 0–44)
AST: 17 U/L (ref 15–41)
Albumin: 4 g/dL (ref 3.5–5.0)
Alkaline Phosphatase: 83 U/L (ref 38–126)
Anion gap: 11 (ref 5–15)
BUN: 13 mg/dL (ref 8–23)
CO2: 27 mmol/L (ref 22–32)
Calcium: 9.1 mg/dL (ref 8.9–10.3)
Chloride: 98 mmol/L (ref 98–111)
Creatinine, Ser: 0.8 mg/dL (ref 0.44–1.00)
GFR calc Af Amer: >60 mL/min (ref >60)
GFR calc non Af Amer: >60 mL/min (ref >60)
Glucose, Bld: 99 mg/dL (ref 70–99)
Potassium: 4.3 mmol/L (ref 3.5–5.1)
Sodium: 136 mmol/L (ref 135–145)
Total Bilirubin: 0.4 mg/dL (ref 0.3–1.2)
Total Protein: 7.2 g/dL (ref 6.5–8.1)

## 2019-07-11 LAB — CBC WITH DIFFERENTIAL/PLATELET
Abs Immature Granulocytes: 0.04 10*3/uL (ref 0.00–0.07)
Basophils Absolute: 0 10*3/uL (ref 0.0–0.1)
Basophils Relative: 0 %
Eosinophils Absolute: 0.1 10*3/uL (ref 0.0–0.5)
Eosinophils Relative: 1 %
HCT: 44.3 % (ref 36.0–46.0)
Hemoglobin: 13.8 g/dL (ref 12.0–15.0)
Immature Granulocytes: 0 %
Lymphocytes Relative: 23 %
Lymphs Abs: 2.1 10*3/uL (ref 0.7–4.0)
MCH: 28.5 pg (ref 26.0–34.0)
MCHC: 31.2 g/dL (ref 30.0–36.0)
MCV: 91.5 fL (ref 80.0–100.0)
Monocytes Absolute: 0.7 10*3/uL (ref 0.1–1.0)
Monocytes Relative: 7 %
Neutro Abs: 6.4 10*3/uL (ref 1.7–7.7)
Neutrophils Relative %: 69 %
Platelets: 291 10*3/uL (ref 150–400)
RBC: 4.84 MIL/uL (ref 3.87–5.11)
RDW: 14.5 % (ref 11.5–15.5)
WBC: 9.4 10*3/uL (ref 4.0–10.5)
nRBC: 0 % (ref 0.0–0.2)

## 2019-07-11 LAB — SURGICAL PCR SCREEN
MRSA, PCR: NEGATIVE
Staphylococcus aureus: NEGATIVE

## 2019-07-11 LAB — SARS CORONAVIRUS 2 (TAT 6-24 HRS): SARS Coronavirus 2: NEGATIVE

## 2019-07-11 NOTE — Progress Notes (Signed)
PCP Dr Blane Ohara  CXR / epic 05/31/2019  CT scan results in chart with surgical clearance  EKG / epic 05/31/2019  Patient verbalized understanding of instructions that were given to them at the PAT appointment. Patient was also instructed that they will need to review over the PAT instructions again at home before surgery.

## 2019-07-14 MED ORDER — BUPIVACAINE LIPOSOME 1.3 % IJ SUSP
20.0000 mL | Freq: Once | INTRAMUSCULAR | Status: DC
  Filled 2019-07-14: qty 20

## 2019-07-14 NOTE — Anesthesia Preprocedure Evaluation (Addendum)
Anesthesia Evaluation  Patient identified by MRN, date of birth, ID band Patient awake    Reviewed: Allergy & Precautions, NPO status , Patient's Chart, lab work & pertinent test results  Airway Mallampati: III  TM Distance: >3 FB Neck ROM: Full    Dental no notable dental hx. (+) Teeth Intact   Pulmonary asthma , pneumonia, resolved, former smoker,  Covid +, 12/31/2018   Pulmonary exam normal breath sounds clear to auscultation       Cardiovascular hypertension, Pt. on medications Normal cardiovascular exam Rhythm:Regular Rate:Normal     Neuro/Psych PSYCHIATRIC DISORDERS Depression Diabetic neuropathy Restless legs syndrome  Neuromuscular disease    GI/Hepatic Neg liver ROS, GERD  Medicated and Controlled,  Endo/Other  diabetes, Well Controlled, Type 2Obesity Hyperlipidemia  Renal/GU negative Renal ROS  negative genitourinary   Musculoskeletal  (+) Arthritis , Osteoarthritis,  OA left knee   Abdominal (+) + obese,   Peds  Hematology   Anesthesia Other Findings   Reproductive/Obstetrics                            Anesthesia Physical Anesthesia Plan  ASA: II  Anesthesia Plan: Spinal   Post-op Pain Management:  Regional for Post-op pain   Induction:   PONV Risk Score and Plan: 3 and Propofol infusion, Ondansetron and Treatment may vary due to age or medical condition  Airway Management Planned: Natural Airway and Simple Face Mask  Additional Equipment:   Intra-op Plan:   Post-operative Plan:   Informed Consent: I have reviewed the patients History and Physical, chart, labs and discussed the procedure including the risks, benefits and alternatives for the proposed anesthesia with the patient or authorized representative who has indicated his/her understanding and acceptance.     Dental advisory given  Plan Discussed with: CRNA and Surgeon  Anesthesia Plan Comments:         Anesthesia Quick Evaluation

## 2019-07-15 ENCOUNTER — Other Ambulatory Visit: Payer: Self-pay

## 2019-07-15 ENCOUNTER — Ambulatory Visit (HOSPITAL_COMMUNITY): Payer: 59 | Admitting: Anesthesiology

## 2019-07-15 ENCOUNTER — Encounter (HOSPITAL_COMMUNITY): Payer: Self-pay | Admitting: Orthopedic Surgery

## 2019-07-15 ENCOUNTER — Observation Stay (HOSPITAL_COMMUNITY)
Admission: RE | Admit: 2019-07-15 | Discharge: 2019-07-16 | Disposition: A | Discharge status: Home / Self Care | Payer: 59 | Source: Ambulatory Visit | Attending: Orthopedic Surgery | Admitting: Orthopedic Surgery

## 2019-07-15 ENCOUNTER — Encounter (HOSPITAL_COMMUNITY)
Admission: RE | Disposition: A | Discharge status: Home / Self Care | Payer: Self-pay | Source: Ambulatory Visit | Attending: Orthopedic Surgery

## 2019-07-15 DIAGNOSIS — E782 Mixed hyperlipidemia: Secondary | ICD-10-CM | POA: Insufficient documentation

## 2019-07-15 DIAGNOSIS — J4520 Mild intermittent asthma, uncomplicated: Secondary | ICD-10-CM | POA: Diagnosis not present

## 2019-07-15 DIAGNOSIS — Z6836 Body mass index (BMI) 36.0-36.9, adult: Secondary | ICD-10-CM | POA: Insufficient documentation

## 2019-07-15 DIAGNOSIS — Z96651 Presence of right artificial knee joint: Secondary | ICD-10-CM | POA: Insufficient documentation

## 2019-07-15 DIAGNOSIS — Z8616 Personal history of COVID-19: Secondary | ICD-10-CM | POA: Diagnosis not present

## 2019-07-15 DIAGNOSIS — Z79899 Other long term (current) drug therapy: Secondary | ICD-10-CM | POA: Insufficient documentation

## 2019-07-15 DIAGNOSIS — K219 Gastro-esophageal reflux disease without esophagitis: Secondary | ICD-10-CM | POA: Diagnosis not present

## 2019-07-15 DIAGNOSIS — Z791 Long term (current) use of non-steroidal anti-inflammatories (NSAID): Secondary | ICD-10-CM | POA: Insufficient documentation

## 2019-07-15 DIAGNOSIS — Z87891 Personal history of nicotine dependence: Secondary | ICD-10-CM | POA: Diagnosis not present

## 2019-07-15 DIAGNOSIS — M1712 Unilateral primary osteoarthritis, left knee: Principal | ICD-10-CM | POA: Insufficient documentation

## 2019-07-15 DIAGNOSIS — F329 Major depressive disorder, single episode, unspecified: Secondary | ICD-10-CM | POA: Diagnosis not present

## 2019-07-15 DIAGNOSIS — G603 Idiopathic progressive neuropathy: Secondary | ICD-10-CM | POA: Diagnosis not present

## 2019-07-15 DIAGNOSIS — E1142 Type 2 diabetes mellitus with diabetic polyneuropathy: Secondary | ICD-10-CM | POA: Diagnosis not present

## 2019-07-15 DIAGNOSIS — G2581 Restless legs syndrome: Secondary | ICD-10-CM | POA: Diagnosis not present

## 2019-07-15 DIAGNOSIS — Z7951 Long term (current) use of inhaled steroids: Secondary | ICD-10-CM | POA: Diagnosis not present

## 2019-07-15 DIAGNOSIS — Z7984 Long term (current) use of oral hypoglycemic drugs: Secondary | ICD-10-CM | POA: Insufficient documentation

## 2019-07-15 DIAGNOSIS — I10 Essential (primary) hypertension: Secondary | ICD-10-CM | POA: Diagnosis not present

## 2019-07-15 DIAGNOSIS — Z96659 Presence of unspecified artificial knee joint: Secondary | ICD-10-CM

## 2019-07-15 DIAGNOSIS — E662 Morbid (severe) obesity with alveolar hypoventilation: Secondary | ICD-10-CM | POA: Diagnosis not present

## 2019-07-15 HISTORY — DX: Presence of unspecified artificial knee joint: Z96.659

## 2019-07-15 HISTORY — PX: TOTAL KNEE ARTHROPLASTY: SHX125

## 2019-07-15 LAB — GLUCOSE, CAPILLARY
Glucose-Capillary: 113 mg/dL — ABNORMAL HIGH (ref 70–99)
Glucose-Capillary: 108 mg/dL — ABNORMAL HIGH (ref 70–99)

## 2019-07-15 SURGERY — ARTHROPLASTY, KNEE, TOTAL
Anesthesia: Spinal | Site: Knee | Laterality: Left

## 2019-07-15 MED ORDER — ACETAMINOPHEN 500 MG PO TABS
1000.0000 mg | ORAL_TABLET | Freq: Four times a day (QID) | ORAL | Status: AC
  Administered 2019-07-15 (×3): 1000 mg via ORAL
  Filled 2019-07-15 (×5): qty 2

## 2019-07-15 MED ORDER — PREGABALIN 100 MG PO CAPS
200.0000 mg | ORAL_CAPSULE | Freq: Three times a day (TID) | ORAL | Status: DC
  Administered 2019-07-15 – 2019-07-16 (×3): 200 mg via ORAL
  Filled 2019-07-15 (×3): qty 2

## 2019-07-15 MED ORDER — OXYCODONE HCL 5 MG/5ML PO SOLN: 5.0000 mg | Freq: Once | ORAL | Status: DC | PRN

## 2019-07-15 MED ORDER — DULOXETINE HCL 60 MG PO CPEP
60.0000 mg | ORAL_CAPSULE | Freq: Two times a day (BID) | ORAL | Status: DC
  Administered 2019-07-16: 08:00:00 60 mg via ORAL
  Filled 2019-07-15: qty 1

## 2019-07-15 MED ORDER — MOMETASONE FURO-FORMOTEROL FUM 200-5 MCG/ACT IN AERO
2.0000 | INHALATION_SPRAY | Freq: Two times a day (BID) | RESPIRATORY_TRACT | Status: DC
  Administered 2019-07-15 – 2019-07-16 (×2): 2 via RESPIRATORY_TRACT
  Filled 2019-07-15: qty 8.8

## 2019-07-15 MED ORDER — ONDANSETRON HCL 4 MG/2ML IJ SOLN
INTRAMUSCULAR | Status: DC | PRN
  Administered 2019-07-15: 4 mg via INTRAVENOUS

## 2019-07-15 MED ORDER — TRANEXAMIC ACID-NACL 1000-0.7 MG/100ML-% IV SOLN
1000.0000 mg | INTRAVENOUS | Status: AC
  Administered 2019-07-15: 1000 mg via INTRAVENOUS
  Filled 2019-07-15: qty 100

## 2019-07-15 MED ORDER — METHOCARBAMOL 500 MG IVPB - SIMPLE MED
500.0000 mg | Freq: Four times a day (QID) | INTRAVENOUS | Status: DC | PRN
  Filled 2019-07-15: qty 50

## 2019-07-15 MED ORDER — ONDANSETRON HCL 4 MG/2ML IJ SOLN: 4.0000 mg | Freq: Once | INTRAMUSCULAR | Status: DC | PRN

## 2019-07-15 MED ORDER — SODIUM CHLORIDE 0.9% FLUSH
INTRAVENOUS | Status: DC | PRN
  Administered 2019-07-15: 20 mL

## 2019-07-15 MED ORDER — MEPERIDINE HCL 50 MG/ML IJ SOLN: 6.2500 mg | INTRAMUSCULAR | Status: DC | PRN

## 2019-07-15 MED ORDER — PROPOFOL 1000 MG/100ML IV EMUL
INTRAVENOUS | Status: AC
  Filled 2019-07-15: qty 100

## 2019-07-15 MED ORDER — BUPIVACAINE-EPINEPHRINE 0.5% -1:200000 IJ SOLN
INTRAMUSCULAR | Status: AC
  Filled 2019-07-15: qty 1

## 2019-07-15 MED ORDER — 0.9 % SODIUM CHLORIDE (POUR BTL) OPTIME
TOPICAL | Status: DC | PRN
  Administered 2019-07-15: 1000 mL

## 2019-07-15 MED ORDER — ONDANSETRON HCL 4 MG/2ML IJ SOLN
INTRAMUSCULAR | Status: AC
  Filled 2019-07-15: qty 2

## 2019-07-15 MED ORDER — SENNOSIDES-DOCUSATE SODIUM 8.6-50 MG PO TABS: 1.0000 | ORAL_TABLET | Freq: Every evening | ORAL | Status: DC | PRN

## 2019-07-15 MED ORDER — ATORVASTATIN CALCIUM 20 MG PO TABS
20.0000 mg | ORAL_TABLET | Freq: Every day | ORAL | Status: DC
  Administered 2019-07-16: 08:00:00 20 mg via ORAL
  Filled 2019-07-15: qty 1

## 2019-07-15 MED ORDER — BISACODYL 5 MG PO TBEC: 5.0000 mg | DELAYED_RELEASE_TABLET | Freq: Every day | ORAL | Status: DC | PRN

## 2019-07-15 MED ORDER — FENTANYL CITRATE (PF) 100 MCG/2ML IJ SOLN
50.0000 ug | INTRAMUSCULAR | Status: DC
  Administered 2019-07-15: 09:00:00 50 ug via INTRAVENOUS
  Filled 2019-07-15: qty 2

## 2019-07-15 MED ORDER — MENTHOL 3 MG MT LOZG: 1.0000 | LOZENGE | OROMUCOSAL | Status: DC | PRN

## 2019-07-15 MED ORDER — LIRAGLUTIDE 18 MG/3ML ~~LOC~~ SOPN: 1.6000 mg | PEN_INJECTOR | Freq: Every day | SUBCUTANEOUS | Status: DC

## 2019-07-15 MED ORDER — DEXAMETHASONE SODIUM PHOSPHATE 10 MG/ML IJ SOLN: 8.0000 mg | Freq: Once | INTRAMUSCULAR | Status: DC

## 2019-07-15 MED ORDER — BUPIVACAINE LIPOSOME 1.3 % IJ SUSP
INTRAMUSCULAR | Status: DC | PRN
  Administered 2019-07-15: 20 mL

## 2019-07-15 MED ORDER — ROPIVACAINE HCL 7.5 MG/ML IJ SOLN
INTRAMUSCULAR | Status: DC | PRN
  Administered 2019-07-15: 20 mL via PERINEURAL

## 2019-07-15 MED ORDER — PROPOFOL 10 MG/ML IV BOLUS
INTRAVENOUS | Status: DC | PRN
  Administered 2019-07-15 (×3): 20 mg via INTRAVENOUS

## 2019-07-15 MED ORDER — ROPINIROLE HCL 1 MG PO TABS
3.0000 mg | ORAL_TABLET | Freq: Every day | ORAL | Status: DC
  Administered 2019-07-15: 22:00:00 3 mg via ORAL
  Filled 2019-07-15: qty 3

## 2019-07-15 MED ORDER — SODIUM CHLORIDE 0.9 % IV SOLN: INTRAVENOUS | Status: DC

## 2019-07-15 MED ORDER — DEXAMETHASONE SODIUM PHOSPHATE 10 MG/ML IJ SOLN
INTRAMUSCULAR | Status: AC
  Filled 2019-07-15: qty 1

## 2019-07-15 MED ORDER — FERROUS SULFATE 325 (65 FE) MG PO TABS
325.0000 mg | ORAL_TABLET | Freq: Three times a day (TID) | ORAL | Status: DC
  Administered 2019-07-15 – 2019-07-16 (×4): 325 mg via ORAL
  Filled 2019-07-15 (×4): qty 1

## 2019-07-15 MED ORDER — BUPIVACAINE-EPINEPHRINE 0.5% -1:200000 IJ SOLN
INTRAMUSCULAR | Status: DC | PRN
  Administered 2019-07-15: 30 mL

## 2019-07-15 MED ORDER — SODIUM CHLORIDE (PF) 0.9 % IJ SOLN
INTRAMUSCULAR | Status: AC
  Filled 2019-07-15: qty 20

## 2019-07-15 MED ORDER — GABAPENTIN 300 MG PO CAPS
300.0000 mg | ORAL_CAPSULE | Freq: Once | ORAL | Status: AC
  Administered 2019-07-15: 300 mg via ORAL
  Filled 2019-07-15: qty 1

## 2019-07-15 MED ORDER — CEFAZOLIN SODIUM-DEXTROSE 2-4 GM/100ML-% IV SOLN
2.0000 g | Freq: Four times a day (QID) | INTRAVENOUS | Status: AC
  Administered 2019-07-15 (×2): 2 g via INTRAVENOUS
  Filled 2019-07-15 (×2): qty 100

## 2019-07-15 MED ORDER — ASPIRIN EC 325 MG PO TBEC
325.0000 mg | DELAYED_RELEASE_TABLET | Freq: Two times a day (BID) | ORAL | Status: DC
  Administered 2019-07-16: 08:00:00 325 mg via ORAL
  Filled 2019-07-15: qty 1

## 2019-07-15 MED ORDER — DICYCLOMINE HCL 20 MG PO TABS
10.0000 mg | ORAL_TABLET | Freq: Four times a day (QID) | ORAL | Status: DC | PRN
  Filled 2019-07-15: qty 1

## 2019-07-15 MED ORDER — TRAMADOL HCL 50 MG PO TABS
50.0000 mg | ORAL_TABLET | Freq: Four times a day (QID) | ORAL | Status: DC
  Administered 2019-07-15 – 2019-07-16 (×4): 50 mg via ORAL
  Filled 2019-07-15 (×4): qty 1

## 2019-07-15 MED ORDER — PANTOPRAZOLE SODIUM 40 MG PO TBEC
40.0000 mg | DELAYED_RELEASE_TABLET | Freq: Every day | ORAL | Status: DC
  Filled 2019-07-15: qty 1

## 2019-07-15 MED ORDER — ONDANSETRON HCL 4 MG/2ML IJ SOLN: 4.0000 mg | Freq: Four times a day (QID) | INTRAMUSCULAR | Status: DC | PRN

## 2019-07-15 MED ORDER — WATER FOR IRRIGATION, STERILE IR SOLN
Status: DC | PRN
  Administered 2019-07-15: 2000 mL

## 2019-07-15 MED ORDER — VORTIOXETINE HBR 5 MG PO TABS
10.0000 mg | ORAL_TABLET | Freq: Every day | ORAL | Status: DC
  Administered 2019-07-16: 08:00:00 10 mg via ORAL
  Filled 2019-07-15: qty 2

## 2019-07-15 MED ORDER — DEXAMETHASONE SODIUM PHOSPHATE 10 MG/ML IJ SOLN
INTRAMUSCULAR | Status: DC | PRN
  Administered 2019-07-15: 10 mg via INTRAVENOUS

## 2019-07-15 MED ORDER — CELECOXIB 200 MG PO CAPS
400.0000 mg | ORAL_CAPSULE | Freq: Once | ORAL | Status: AC
  Administered 2019-07-15: 07:00:00 400 mg via ORAL
  Filled 2019-07-15: qty 2

## 2019-07-15 MED ORDER — PHENYLEPHRINE 40 MCG/ML (10ML) SYRINGE FOR IV PUSH (FOR BLOOD PRESSURE SUPPORT)
PREFILLED_SYRINGE | INTRAVENOUS | Status: DC | PRN
  Administered 2019-07-15: 80 ug via INTRAVENOUS

## 2019-07-15 MED ORDER — SUVOREXANT 10 MG PO TABS
10.0000 mg | ORAL_TABLET | Freq: Every evening | ORAL | Status: DC | PRN
  Filled 2019-07-15: qty 1

## 2019-07-15 MED ORDER — PROPOFOL 500 MG/50ML IV EMUL
INTRAVENOUS | Status: DC | PRN
  Administered 2019-07-15: 100 ug/kg/min via INTRAVENOUS

## 2019-07-15 MED ORDER — DOCUSATE SODIUM 100 MG PO CAPS
100.0000 mg | ORAL_CAPSULE | Freq: Two times a day (BID) | ORAL | Status: DC
  Administered 2019-07-15 – 2019-07-16 (×2): 100 mg via ORAL
  Filled 2019-07-15 (×2): qty 1

## 2019-07-15 MED ORDER — OXYCODONE HCL 5 MG PO TABS: 5.0000 mg | ORAL_TABLET | Freq: Once | ORAL | Status: DC | PRN

## 2019-07-15 MED ORDER — PROPOFOL 10 MG/ML IV BOLUS
INTRAVENOUS | Status: AC
  Filled 2019-07-15: qty 20

## 2019-07-15 MED ORDER — METOCLOPRAMIDE HCL 5 MG/ML IJ SOLN: 5.0000 mg | Freq: Three times a day (TID) | INTRAMUSCULAR | Status: DC | PRN

## 2019-07-15 MED ORDER — METFORMIN HCL 500 MG PO TABS
500.0000 mg | ORAL_TABLET | Freq: Every day | ORAL | Status: DC
  Administered 2019-07-16: 08:00:00 500 mg via ORAL
  Filled 2019-07-15: qty 1

## 2019-07-15 MED ORDER — GABAPENTIN 300 MG PO CAPS
300.0000 mg | ORAL_CAPSULE | Freq: Three times a day (TID) | ORAL | Status: DC
  Administered 2019-07-15 – 2019-07-16 (×3): 300 mg via ORAL
  Filled 2019-07-15 (×3): qty 1

## 2019-07-15 MED ORDER — DEXAMETHASONE SODIUM PHOSPHATE 10 MG/ML IJ SOLN
10.0000 mg | Freq: Once | INTRAMUSCULAR | Status: AC
  Administered 2019-07-16: 10 mg via INTRAVENOUS
  Filled 2019-07-15: qty 1

## 2019-07-15 MED ORDER — FENTANYL CITRATE (PF) 100 MCG/2ML IJ SOLN: 25.0000 ug | INTRAMUSCULAR | Status: DC | PRN

## 2019-07-15 MED ORDER — FLEET ENEMA 7-19 GM/118ML RE ENEM: 1.0000 | ENEMA | Freq: Once | RECTAL | Status: DC | PRN

## 2019-07-15 MED ORDER — DARIFENACIN HYDROBROMIDE ER 7.5 MG PO TB24
7.5000 mg | ORAL_TABLET | Freq: Every day | ORAL | Status: DC
  Administered 2019-07-16: 08:00:00 7.5 mg via ORAL
  Filled 2019-07-15: qty 1

## 2019-07-15 MED ORDER — MIRTAZAPINE 15 MG PO TABS
15.0000 mg | ORAL_TABLET | Freq: Every day | ORAL | Status: DC
  Administered 2019-07-15: 22:00:00 15 mg via ORAL
  Filled 2019-07-15: qty 1

## 2019-07-15 MED ORDER — OXYCODONE HCL 5 MG PO TABS
5.0000 mg | ORAL_TABLET | ORAL | Status: DC | PRN
  Administered 2019-07-15 – 2019-07-16 (×4): 10 mg via ORAL
  Filled 2019-07-15 (×4): qty 2

## 2019-07-15 MED ORDER — PANTOPRAZOLE SODIUM 40 MG PO TBEC
40.0000 mg | DELAYED_RELEASE_TABLET | Freq: Every day | ORAL | Status: DC
  Administered 2019-07-15 – 2019-07-16 (×2): 40 mg via ORAL
  Filled 2019-07-15: qty 1

## 2019-07-15 MED ORDER — AMITRIPTYLINE HCL 50 MG PO TABS
50.0000 mg | ORAL_TABLET | Freq: Every day | ORAL | Status: DC
  Administered 2019-07-15: 22:00:00 50 mg via ORAL
  Filled 2019-07-15 (×2): qty 1

## 2019-07-15 MED ORDER — DIPHENHYDRAMINE HCL 12.5 MG/5ML PO ELIX: 12.5000 mg | ORAL_SOLUTION | ORAL | Status: DC | PRN

## 2019-07-15 MED ORDER — LACTATED RINGERS IV SOLN: INTRAVENOUS | Status: DC

## 2019-07-15 MED ORDER — ZOLPIDEM TARTRATE 5 MG PO TABS
5.0000 mg | ORAL_TABLET | Freq: Every evening | ORAL | Status: DC | PRN
  Administered 2019-07-15: 22:00:00 5 mg via ORAL
  Filled 2019-07-15: qty 1

## 2019-07-15 MED ORDER — TRANEXAMIC ACID-NACL 1000-0.7 MG/100ML-% IV SOLN
1000.0000 mg | Freq: Once | INTRAVENOUS | Status: AC
  Administered 2019-07-15: 14:00:00 1000 mg via INTRAVENOUS
  Filled 2019-07-15: qty 100

## 2019-07-15 MED ORDER — METOCLOPRAMIDE HCL 5 MG PO TABS: 5.0000 mg | ORAL_TABLET | Freq: Three times a day (TID) | ORAL | Status: DC | PRN

## 2019-07-15 MED ORDER — PHENOL 1.4 % MT LIQD: 1.0000 | OROMUCOSAL | Status: DC | PRN

## 2019-07-15 MED ORDER — ONDANSETRON HCL 4 MG PO TABS
4.0000 mg | ORAL_TABLET | Freq: Four times a day (QID) | ORAL | Status: DC | PRN
  Administered 2019-07-16: 4 mg via ORAL
  Filled 2019-07-15: qty 1

## 2019-07-15 MED ORDER — HYDROCHLOROTHIAZIDE 25 MG PO TABS
25.0000 mg | ORAL_TABLET | Freq: Every day | ORAL | Status: DC
  Administered 2019-07-15 – 2019-07-16 (×2): 25 mg via ORAL
  Filled 2019-07-15 (×2): qty 1

## 2019-07-15 MED ORDER — POVIDONE-IODINE 10 % EX SWAB
2.0000 "application " | Freq: Once | CUTANEOUS | Status: AC
  Administered 2019-07-15: 2 via TOPICAL

## 2019-07-15 MED ORDER — METHOCARBAMOL 500 MG PO TABS
500.0000 mg | ORAL_TABLET | Freq: Four times a day (QID) | ORAL | Status: DC | PRN
  Administered 2019-07-15 – 2019-07-16 (×3): 500 mg via ORAL
  Filled 2019-07-15 (×3): qty 1

## 2019-07-15 MED ORDER — HYDROMORPHONE HCL 1 MG/ML IJ SOLN
0.5000 mg | INTRAMUSCULAR | Status: DC | PRN
  Administered 2019-07-15: 15:00:00 1 mg via INTRAVENOUS
  Filled 2019-07-15: qty 1

## 2019-07-15 MED ORDER — ACETAMINOPHEN 500 MG PO TABS
1000.0000 mg | ORAL_TABLET | Freq: Once | ORAL | Status: AC
  Administered 2019-07-15: 07:00:00 1000 mg via ORAL
  Filled 2019-07-15: qty 2

## 2019-07-15 MED ORDER — MIDAZOLAM HCL 2 MG/2ML IJ SOLN
1.0000 mg | INTRAMUSCULAR | Status: DC
  Administered 2019-07-15: 2 mg via INTRAVENOUS
  Filled 2019-07-15: qty 2

## 2019-07-15 MED ORDER — MEPIVACAINE HCL (PF) 2 % IJ SOLN
INTRAMUSCULAR | Status: DC | PRN
  Administered 2019-07-15: 3 mL via INTRATHECAL

## 2019-07-15 MED ORDER — ALUM & MAG HYDROXIDE-SIMETH 200-200-20 MG/5ML PO SUSP: 30.0000 mL | ORAL | Status: DC | PRN

## 2019-07-15 MED ORDER — CEFAZOLIN SODIUM-DEXTROSE 2-4 GM/100ML-% IV SOLN
2.0000 g | INTRAVENOUS | Status: AC
  Administered 2019-07-15: 2 g via INTRAVENOUS
  Filled 2019-07-15: qty 100

## 2019-07-15 MED ORDER — SODIUM CHLORIDE 0.9 % IR SOLN
Status: DC | PRN
  Administered 2019-07-15: 1000 mL

## 2019-07-15 SURGICAL SUPPLY — 63 items
ARTISURF 16M PLY L 6-9CD KNEE (Knees) ×3 IMPLANT
BAG ZIPLOCK 12X15 (MISCELLANEOUS) ×3 IMPLANT
BLADE SAGITTAL 13X1.27X60 (BLADE) ×2 IMPLANT
BLADE SAGITTAL 13X1.27X60MM (BLADE) ×1
BLADE SAW SGTL 83.5X18.5 (BLADE) ×3 IMPLANT
BLADE SURG 15 STRL LF DISP TIS (BLADE) ×1 IMPLANT
BLADE SURG 15 STRL SS (BLADE) ×2
BLADE SURG SZ10 CARB STEEL (BLADE) ×6 IMPLANT
BNDG ELASTIC 6X5.8 VLCR STR LF (GAUZE/BANDAGES/DRESSINGS) ×3 IMPLANT
BOWL SMART MIX CTS (DISPOSABLE) ×3 IMPLANT
CEMENT BONE SIMPLEX SPEEDSET (Cement) ×6 IMPLANT
CLOSURE STERI-STRIP 1/2X4 (GAUZE/BANDAGES/DRESSINGS) ×2
CLOSURE WOUND 1/2 X4 (GAUZE/BANDAGES/DRESSINGS) ×1
CLSR STERI-STRIP ANTIMIC 1/2X4 (GAUZE/BANDAGES/DRESSINGS) ×4 IMPLANT
COVER SURGICAL LIGHT HANDLE (MISCELLANEOUS) ×3 IMPLANT
COVER WAND RF STERILE (DRAPES) ×3 IMPLANT
CUFF TOURN SGL QUICK 34 (TOURNIQUET CUFF) ×2
CUFF TRNQT CYL 34X4.125X (TOURNIQUET CUFF) ×1 IMPLANT
DECANTER SPIKE VIAL GLASS SM (MISCELLANEOUS) ×6 IMPLANT
DRAPE INCISE IOBAN 66X45 STRL (DRAPES) ×6 IMPLANT
DRAPE U-SHAPE 47X51 STRL (DRAPES) ×3 IMPLANT
DRESSING AQUACEL AG SP 3.5X10 (GAUZE/BANDAGES/DRESSINGS) ×1 IMPLANT
DRSG AQUACEL AG ADV 3.5X10 (GAUZE/BANDAGES/DRESSINGS) ×3 IMPLANT
DRSG AQUACEL AG SP 3.5X10 (GAUZE/BANDAGES/DRESSINGS) ×3
DURAPREP 26ML APPLICATOR (WOUND CARE) ×6 IMPLANT
ELECT REM PT RETURN 15FT ADLT (MISCELLANEOUS) ×3 IMPLANT
FEMUR  CMT CCR STD SZ7 L KNEE (Knees) ×2 IMPLANT
FEMUR CMT CCR STD SZ7 L KNEE (Knees) ×1 IMPLANT
FEMUR CMTD CCR STD SZ7 L KNEE (Knees) ×1 IMPLANT
GLOVE BIOGEL M STRL SZ7.5 (GLOVE) ×3 IMPLANT
GLOVE BIOGEL PI IND STRL 7.5 (GLOVE) ×1 IMPLANT
GLOVE BIOGEL PI IND STRL 8.5 (GLOVE) ×2 IMPLANT
GLOVE BIOGEL PI INDICATOR 7.5 (GLOVE) ×2
GLOVE BIOGEL PI INDICATOR 8.5 (GLOVE) ×4
GLOVE SURG ORTHO 8.0 STRL STRW (GLOVE) ×9 IMPLANT
GOWN STRL REUS W/ TWL XL LVL3 (GOWN DISPOSABLE) ×2 IMPLANT
GOWN STRL REUS W/TWL XL LVL3 (GOWN DISPOSABLE) ×4
HANDPIECE INTERPULSE COAX TIP (DISPOSABLE) ×2
HOLDER FOLEY CATH W/STRAP (MISCELLANEOUS) ×3 IMPLANT
HOOD PEEL AWAY FLYTE STAYCOOL (MISCELLANEOUS) ×9 IMPLANT
KIT TURNOVER KIT A (KITS) IMPLANT
MANIFOLD NEPTUNE II (INSTRUMENTS) ×3 IMPLANT
NEEDLE HYPO 22GX1.5 SAFETY (NEEDLE) ×3 IMPLANT
NS IRRIG 1000ML POUR BTL (IV SOLUTION) ×3 IMPLANT
PACK TOTAL KNEE CUSTOM (KITS) ×3 IMPLANT
PENCIL SMOKE EVACUATOR (MISCELLANEOUS) IMPLANT
PROTECTOR NERVE ULNAR (MISCELLANEOUS) ×3 IMPLANT
SET HNDPC FAN SPRY TIP SCT (DISPOSABLE) ×1 IMPLANT
STEM POLY PAT PLY 32M KNEE (Knees) ×3 IMPLANT
STEM TIBIA 5 DEG SZ D L KNEE (Knees) ×1 IMPLANT
STRIP CLOSURE SKIN 1/2X4 (GAUZE/BANDAGES/DRESSINGS) ×2 IMPLANT
SUT BONE WAX W31G (SUTURE) ×3 IMPLANT
SUT MNCRL AB 3-0 PS2 18 (SUTURE) ×3 IMPLANT
SUT STRATAFIX 0 PDS 27 VIOLET (SUTURE) ×3
SUT STRATAFIX PDS+ 0 24IN (SUTURE) ×3 IMPLANT
SUT VIC AB 1 CT1 36 (SUTURE) ×3 IMPLANT
SUTURE STRATFX 0 PDS 27 VIOLET (SUTURE) ×1 IMPLANT
SYR CONTROL 10ML LL (SYRINGE) ×6 IMPLANT
TIBIA STEM 5 DEG SZ D L KNEE (Knees) ×3 IMPLANT
TRAY FOLEY MTR SLVR 16FR STAT (SET/KITS/TRAYS/PACK) ×3 IMPLANT
WATER STERILE IRR 1000ML POUR (IV SOLUTION) ×6 IMPLANT
WRAP KNEE MAXI GEL POST OP (GAUZE/BANDAGES/DRESSINGS) ×3 IMPLANT
YANKAUER SUCT BULB TIP 10FT TU (MISCELLANEOUS) ×3 IMPLANT

## 2019-07-15 NOTE — Anesthesia Procedure Notes (Signed)
Spinal  Patient location during procedure: OR Start time: 07/15/2019 9:31 AM End time: 07/15/2019 9:41 AM Staffing Performed: anesthesiologist  Anesthesiologist: Mal Amabile, MD Preanesthetic Checklist Completed: patient identified, IV checked, site marked, risks and benefits discussed, surgical consent, monitors and equipment checked, pre-op evaluation and timeout performed Spinal Block Patient position: sitting Prep: DuraPrep and site prepped and draped Patient monitoring: heart rate, cardiac monitor, continuous pulse ox and blood pressure Approach: midline Location: L4-5 Injection technique: single-shot Needle Needle type: Pencan  Needle gauge: 24 G Needle length: 9 cm Needle insertion depth: 9 cm Assessment Sensory level: T4 Additional Notes Patient tolerated procedure well. Adequate sensory level. Difficult with multiple attempts.

## 2019-07-15 NOTE — H&P (Signed)
Hailey Greene MRN:  [id number] DOB/SEX:  1950-05-07/female  CHIEF COMPLAINT:  Painful left Knee  HISTORY: Patient is a 69 y.o. female presented with a history of pain in the left knee. Onset of symptoms was gradual starting a few years ago with gradually worsening course since that time. Patient has been treated conservatively with over-the-counter NSAIDs and activity modification. Patient currently rates pain in the knee at 10 out of 10 with activity. There is pain at night.  PAST MEDICAL HISTORY: Patient Active Problem List   Diagnosis Date Noted  . Diarrhea of presumed infectious origin 06/06/2019  . Class 2 obesity with alveolar hypoventilation, serious comorbidity, and body mass index (BMI) of 37.0 to 37.9 in adult (HCC) 06/02/2019  . Mild intermittent asthma without complication 06/02/2019  . Preoperative cardiovascular examination 05/31/2019  . Primary osteoarthritis of left knee 05/31/2019  . Diabetic polyneuropathy associated with type 2 diabetes mellitus (HCC) 05/31/2019  . Acute non-recurrent maxillary sinusitis 05/08/2019  . 2019 novel coronavirus disease (COVID-19) 12/31/2018  . Benign essential HTN 12/31/2018  . Chronic pain of left knee 12/11/2018  . Incisional hernia 11/03/2016  . Idiopathic progressive neuropathy 04/11/2014   Past Medical History:  Diagnosis Date  . Acute hypoxemic respiratory failure due to COVID-19 (HCC)   . Chicken pox   . Depression   . Diabetes (HCC)   . GERD (gastroesophageal reflux disease)   . Hypertension   . Idiopathic progressive neuropathy   . Major depressive disorder   . Mixed hyperlipidemia   . Neuropathy   . Osteoarthritis   . Pneumonia   . Primary insomnia   . RLS (restless legs syndrome)   . Tachycardia   . Urge incontinence   . UTI (lower urinary tract infection)   . Weakness    Past Surgical History:  Procedure Laterality Date  . ABDOMINAL HYSTERECTOMY    . APPENDECTOMY    . CHOLECYSTECTOMY    . HERNIA REPAIR     . REPLACEMENT TOTAL KNEE Right   . TONSILLECTOMY       MEDICATIONS:   Medications Prior to Admission  Medication Sig Dispense Refill Last Dose  . acetaminophen (TYLENOL) 500 MG tablet Take 500-1,000 mg by mouth every 6 (six) hours as needed (for pain).     Marland Kitchen amitriptyline (ELAVIL) 50 MG tablet Take 50 mg by mouth at bedtime.     Marland Kitchen atorvastatin (LIPITOR) 20 MG tablet TAKE ONE (1) TABLET ONCE DAILY (Patient taking differently: Take 20 mg by mouth daily. ) 90 tablet 0   . BELSOMRA 10 MG TABS Take 10 mg by mouth at bedtime as needed (sleep.).      Marland Kitchen cyclobenzaprine (FLEXERIL) 10 MG tablet TAKE 1 TABLET 3 TIMES A DAY AS NEEDED (Patient taking differently: Take 10 mg by mouth at bedtime. ) 270 tablet 0   . dicyclomine (BENTYL) 20 MG tablet TAKE ONE TABLET EVERY 6 HOURS AS NEEDED (Patient taking differently: Take 10 mg by mouth every 6 (six) hours as needed (stomach problems/abdominal spasms.). ) 360 tablet 0   . DULoxetine (CYMBALTA) 60 MG capsule Take 60 mg by mouth 2 (two) times daily.      . hydrochlorothiazide (HYDRODIURIL) 25 MG tablet Take 25 mg by mouth daily.     . meloxicam (MOBIC) 15 MG tablet Take 15 mg by mouth daily as needed (pain.).      Marland Kitchen metFORMIN (GLUCOPHAGE) 500 MG tablet Take 500 mg by mouth daily with breakfast.     . mirtazapine (REMERON)  15 MG tablet Take 15 mg by mouth at bedtime.     . Multiple Vitamin (MULTIVITAMIN WITH MINERALS) TABS tablet Take 1 tablet by mouth daily.     Marland Kitchen omeprazole (PRILOSEC) 20 MG capsule TAKE ONE CAPSULE BY MOUTH DAILY BEFORE AMEAL (Patient taking differently: Take 20 mg by mouth daily. ) 90 capsule 1   . pregabalin (LYRICA) 200 MG capsule TAKE ONE (1) CAPSULE THREE (3) TIMES EACH DAY (Patient taking differently: Take 200 mg by mouth in the morning, at noon, and at bedtime. ) 270 capsule 0   . rOPINIRole (REQUIP) 3 MG tablet Take 3 mg by mouth at bedtime.     . solifenacin (VESICARE) 5 MG tablet TAKE ONE (1) TABLET ONCE DAILY (Patient taking  differently: Take 5 mg by mouth daily. ) 30 tablet 2   . SYMBICORT 160-4.5 MCG/ACT inhaler Inhale 2 puffs into the lungs 2 times daily at 12 noon and 4 pm. 1 Inhaler 3   . TRINTELLIX 10 MG TABS tablet TAKE 1 TABLET BY MOUTH ONCE DAILY AT [facility] TIME [facility] DAY (Patient taking differently: Take 10 mg by mouth daily. ) 90 tablet 0   . glucose blood (ONETOUCH VERIO) test strip      . Lancets (ONETOUCH DELICA PLUS LANCET33G) MISC      . levofloxacin (LEVAQUIN) 500 MG tablet Take 1 tablet (500 mg total) by mouth daily. Take 1 daily (Patient not taking: Reported on 07/09/2019) 10 tablet 0 Not Taking at Unknown time  . ondansetron (ZOFRAN) 4 MG tablet TAKE ONE TABLET BY MOUTH EVERY EIGHT HOURS AS NEEDED NAUSEA 90 tablet 0   . phentermine (ADIPEX-P) 37.5 MG tablet Take 37.5 mg by mouth daily before breakfast.     . traMADol (ULTRAM) 50 MG tablet Take 50-100 mg by mouth every 6 (six) hours as needed (pain).     Marland Kitchen VICTOZA 18 MG/3ML SOPN INJECT 1.2 MG ONCE DAILY (Patient taking differently: Inject 1.6 mg into the skin daily. ) 3 pen 4     ALLERGIES:  No Known Allergies  REVIEW OF SYSTEMS:  A comprehensive review of systems was negative except for: Musculoskeletal: positive for arthralgias and bone pain   FAMILY HISTORY:   Family History  Problem Relation Age of Onset  . Thyroid disease Mother   . Cancer Mother   . Migraines Mother   . Brain cancer Father   . Heart disease Maternal Grandmother   . Diabetes Daughter     SOCIAL HISTORY:   Social History   Tobacco Use  . Smoking status: Former Smoker    Packs/day: 2.00    Years: 12.00    Pack years: 24.00    Types: Cigarettes    Quit date: 05/25/2010    Years since quitting: 9.1  . Smokeless tobacco: Never Used  Substance Use Topics  . Alcohol use: Yes    Comment: occasional     EXAMINATION:  Vital signs in last 24 hours:    There were no vitals taken for this visit.  General Appearance:    Alert, cooperative, no distress, appears  stated age  Head:    Normocephalic, without obvious abnormality, atraumatic  Eyes:    PERRL, conjunctiva/corneas clear, EOM's intact, fundi    benign, both eyes  Ears:    Normal TM's and external ear canals, both ears  Nose:   Nares normal, septum midline, mucosa normal, no drainage    or sinus tenderness  Throat:   Lips, mucosa, and tongue normal; teeth  and gums normal  Neck:   Supple, symmetrical, trachea midline, no adenopathy;    thyroid:  no enlargement/tenderness/nodules; no carotid   bruit or JVD  Back:     Symmetric, no curvature, ROM normal, no CVA tenderness  Lungs:     Clear to auscultation bilaterally, respirations unlabored  Chest Wall:    No tenderness or deformity   Heart:    Regular rate and rhythm, S1 and S2 normal, no murmur, rub   or gallop  Breast Exam:    No tenderness, masses, or nipple abnormality  Abdomen:     Soft, non-tender, bowel sounds active all four quadrants,    no masses, no organomegaly  Genitalia:    Normal female without lesion, discharge or tenderness  Rectal:    Normal tone, no masses or tenderness;   guaiac negative stool  Extremities:   Extremities normal, atraumatic, no cyanosis or edema  Pulses:   2+ and symmetric all extremities  Skin:   Skin color, texture, turgor normal, no rashes or lesions  Lymph nodes:   Cervical, supraclavicular, and axillary nodes normal  Neurologic:   CNII-XII intact, normal strength, sensation and reflexes    throughout    Musculoskeletal:  ROM 0-120, Ligaments intact,  Imaging Review Plain radiographs demonstrate severe degenerative joint disease of the left knee. The overall alignment is neutral. The bone quality appears to be good for age and reported activity level.  Assessment/Plan: Primary osteoarthritis, left knee   The patient history, physical examination and imaging studies are consistent with advanced degenerative joint disease of the left knee. The patient has failed conservative treatment.  The  clearance notes were reviewed.  After discussion with the patient it was felt that Total Knee Replacement was indicated. The procedure,  risks, and benefits of total knee arthroplasty were presented and reviewed. The risks including but not limited to aseptic loosening, infection, blood clots, vascular injury, stiffness, patella tracking problems complications among others were discussed. The patient acknowledged the explanation, agreed to proceed with the plan.  Preoperative templating of the joint replacement has been completed, documented, and submitted to the Operating Room personnel in order to optimize intra-operative equipment management.    Patient's anticipated LOS is less than 2 midnights, meeting these requirements: - Lives within 1 hour of care - Has a competent adult at home to recover with post-op recover - NO history of  - Chronic pain requiring opiods  - Diabetes  - Coronary Artery Disease  - Heart failure  - Heart attack  - Stroke  - DVT/VTE  - Cardiac arrhythmia  - Respiratory Failure/COPD  - Renal failure  - Anemia  - Advanced Liver disease       Donia Ast 07/15/2019, 6:59 AM

## 2019-07-15 NOTE — Op Note (Signed)
TOTAL KNEE REPLACEMENT OPERATIVE NOTE:  07/15/2019  11:16 AM  PATIENT:  Hailey Greene  69 y.o. female  PRE-OPERATIVE DIAGNOSIS:  Primary Osteoarthritis Left Knee  POST-OPERATIVE DIAGNOSIS:  Primary Osteoarthritis Left Knee  PROCEDURE:  Procedure(s): TOTAL KNEE ARTHROPLASTY  SURGEON:  Surgeon(s): Vickey Huger, MD  PHYSICIAN ASSISTANT: Carlyon Shadow, PA-C   ANESTHESIA:   spinal  SPECIMEN: None  COUNTS:  Correct  TOURNIQUET:   Total Tourniquet Time Documented: Thigh (Left) - 37 minutes Total: Thigh (Left) - 37 minutes   DICTATION:  Indication for procedure:    The patient is a 69 y.o. female who has failed conservative treatment for Primary Osteoarthritis Left Knee.  Informed consent was obtained prior to anesthesia. The risks versus benefits of the operation were explain and in a way the patient can, and did, understand.    Description of procedure:     The patient was taken to the operating room and placed under anesthesia.  The patient was positioned in the usual fashion taking care that all body parts were adequately padded and/or protected.  A tourniquet was applied and the leg prepped and draped in the usual sterile fashion.  The extremity was exsanguinated with the esmarch and tourniquet inflated to 350 mmHg.  Pre-operative range of motion was normal.    A midline incision approximately 6-7 inches long was made with a #10 blade.  A new blade was used to make a parapatellar arthrotomy going 2-3 cm into the quadriceps tendon, over the patella, and alongside the medial aspect of the patellar tendon.  A synovectomy was then performed with the #10 blade and forceps. I then elevated the deep MCL off the medial tibial metaphysis subperiosteally around to the semimembranosus attachment.    I everted the patella and used calipers to measure patellar thickness.  I used the reamer to ream down to appropriate thickness to recreate the native thickness.  I then removed excess  bone with the rongeur and sagittal saw.  I used the appropriately sized template and drilled the three lug holes.  I then put the trial in place and measured the thickness with the calipers to ensure recreation of the native thickness.  The trial was then removed and the patella subluxed and the knee brought into flexion.  A homan retractor was place to retract and protect the patella and lateral structures.  A Z-retractor was place medially to protect the medial structures.  The extra-medullary alignment system was used to make cut the tibial articular surface perpendicular to the anamotic axis of the tibia and in 3 degrees of posterior slope.  The cut surface and alignment jig was removed.  I then used the intramedullary alignment guide to make a valgus cut on the distal femur.  I then marked out the epicondylar axis on the distal femur.    I then used the anterior referencing sizer and measured the femur to be a size 7.  The 4-In-1 cutting block was screwed into place in external rotation matching the posterior condylar angle, making our cuts perpendicular to the epicondylar axis.  Anterior, posterior and chamfer cuts were made with the sagittal saw.  The cutting block and cut pieces were removed.  A lamina spreader was placed in 90 degrees of flexion.  The ACL, PCL, menisci, and posterior condylar osteophytes were removed.  A 16 mm spacer blocked was found to offer good flexion and extension gap balance after minimal in degree releasing.   The scoop retractor was then placed and  the femoral finishing block was pinned in place.  The small sagittal saw was used as well as the lug drill to finish the femur.  The block and cut surfaces were removed and the medullary canal hole filled with autograft bone from the cut pieces.  The tibia was delivered forward in deep flexion and external rotation.  A size D tray was selected and pinned into place centered on the medial 1/3 of the tibial tubercle.  The reamer  and keel was used to prepare the tibia through the tray.    I then trialed with the size 7 femur, size D tibia, a 16 mm insert and the 32 patella.  I had excellent flexion/extension gap balance, excellent patella tracking.  Flexion was full and beyond 120 degrees; extension was zero.  These components were chosen and the staff opened them to me on the back table while the knee was lavaged copiously and the cement mixed.  The soft tissue was infiltrated with 60cc of exparel 1.3% through a 21 gauge needle.  I cemented in the components and removed all excess cement.  The polyethylene tibial component was snapped into place and the knee placed in extension while cement was hardening.  The capsule was infilltrated with a 60cc exparel/marcaine/saline mixture.   Once the cement was hard, the tourniquet was let down.  Hemostasis was obtained.  The arthrotomy was closed using a #1 stratofix running suture.  The deep soft tissues were closed with #0 vicryls and the subcuticular layer closed with #2-0 vicryl.  The skin was reapproximated and closed with 3.0 Monocryl.  The wound was covered with steristrips, aquacel dressing, and a TED stocking.   The patient was then awakened, extubated, and taken to the recovery room in stable condition.  BLOOD LOSS:  300cc COMPLICATIONS:  None.  PLAN OF CARE: Admit for overnight observation  PATIENT DISPOSITION:  PACU - hemodynamically stable.    Please fax a copy of this op note to my office at [phone] (please only include page 1 and 2 of the Case Information op note)

## 2019-07-15 NOTE — Plan of Care (Signed)
  Problem: Education: Goal: Knowledge of General Education information will improve Description: Including pain rating scale, medication(s)/side effects and non-pharmacologic comfort measures Outcome: Progressing   Problem: Activity: Goal: Risk for activity intolerance will decrease Outcome: Progressing   Problem: Coping: Goal: Level of anxiety will decrease Outcome: Progressing   Problem: Pain Managment: Goal: General experience of comfort will improve Outcome: Progressing   Problem: Education: Goal: Knowledge of the prescribed therapeutic regimen will improve Outcome: Progressing Goal: Individualized Educational Video(s) Outcome: Progressing   

## 2019-07-15 NOTE — Progress Notes (Signed)
Orthopedic Tech Progress Note Patient Details:  Hailey Greene 03-13-1951 [id number]  CPM Left Knee CPM Left Knee: On Left Knee Flexion (Degrees): 90 Left Knee Extension (Degrees): 0  Post Interventions Patient Tolerated: Well Instructions Provided: Care of device Ortho Devices Type of Ortho Device: CPM padding Ortho Device/Splint Location: LLE Ortho Device/Splint Interventions: Ordered, Application, Adjustment   Post Interventions Patient Tolerated: Well Instructions Provided: Care of device   Ancil Linsey 07/15/2019, 11:57 AM

## 2019-07-15 NOTE — Anesthesia Procedure Notes (Signed)
Anesthesia Regional Block: Adductor canal block   Pre-Anesthetic Checklist: ,, timeout performed, Correct Patient, Correct Site, Correct Laterality, Correct Procedure, Correct Position, site marked, Risks and benefits discussed,  Surgical consent,  Pre-op evaluation,  At surgeon's request and post-op pain management  Laterality: Left  Prep: chloraprep       Needles:  Injection technique: Single-shot  Needle Type: Echogenic Stimulator Needle     Needle Length: 9cm  Needle Gauge: 21   Needle insertion depth: 8 cm   Additional Needles:   Procedures:,,,, ultrasound used (permanent image in chart),,,,  Narrative:  Start time: 07/15/2019 8:45 AM End time: 07/15/2019 8:49 AM Injection made incrementally with aspirations every 5 mL.  Performed by: Personally  Anesthesiologist: Mal Amabile, MD  Additional Notes: Timeout performed. Patient sedated. Relevant anatomy ID'd using [place]. Incremental 2-75ml injection of LA with frequent aspiration. Patient tolerated procedure well.        Left Adductor Canal Block

## 2019-07-15 NOTE — Progress Notes (Signed)
Patient notified by this nurse that victoza medication SQ will need to be supplied by home medication supply- says that she will let her husband know. Call bell within reach- call bell within reach

## 2019-07-15 NOTE — Transfer of Care (Signed)
Immediate Anesthesia Transfer of Care Note  Patient: SUHEILY BIRKS  Procedure(s) Performed: TOTAL KNEE ARTHROPLASTY (Left Knee)  Patient Location: PACU  Anesthesia Type:Spinal  Level of Consciousness: awake and drowsy  Airway & Oxygen Therapy: Patient Spontanous Breathing and Patient connected to face mask oxygen  Post-op Assessment: Report given to RN and Post -op Vital signs reviewed and stable  Post vital signs: Reviewed and stable  Last Vitals:  Vitals Value Taken Time  BP 113/66 07/15/19 1106  Temp    Pulse 86 07/15/19 1107  Resp 15 07/15/19 1107  SpO2 93 % 07/15/19 1107  Vitals shown include unvalidated device data.  Last Pain:  Vitals:   07/15/19 0704  TempSrc: Oral         Complications: No apparent anesthesia complications

## 2019-07-15 NOTE — Plan of Care (Signed)

## 2019-07-15 NOTE — Anesthesia Postprocedure Evaluation (Signed)
Anesthesia Post Note  Patient: CORYN MOSSO  Procedure(s) Performed: TOTAL KNEE ARTHROPLASTY (Left Knee)     Patient location during evaluation: PACU Anesthesia Type: Spinal Level of consciousness: oriented, awake and alert and awake Pain management: pain level controlled Vital Signs Assessment: post-procedure vital signs reviewed and stable Respiratory status: spontaneous breathing, respiratory function stable and patient connected to nasal cannula oxygen Cardiovascular status: blood pressure returned to baseline and stable Postop Assessment: no headache, no backache, no apparent nausea or vomiting, spinal receding and patient able to bend at knees Anesthetic complications: no    Last Vitals:  Vitals:   07/15/19 1537 07/15/19 1823  BP: 118/61 (!) 118/51  Pulse: 81 90  Resp: 17 18  Temp: 36.5 C 36.7 C  SpO2: 95% 93%    Last Pain:  Vitals:   07/15/19 1823  TempSrc: Oral  PainSc:                  Tammie Ellsworth A.

## 2019-07-15 NOTE — Progress Notes (Signed)
AssistedDr. Foster with left, ultrasound guided, adductor canal block. Side rails up, monitors on throughout procedure. See vital signs in flow sheet. Tolerated Procedure well.  

## 2019-07-15 NOTE — Evaluation (Signed)
Physical Therapy Evaluation Patient Details Name: Hailey Greene MRN: [id number] DOB: 1950-08-16 Today's Date: 07/15/2019   History of Present Illness  Patient is 69 y.o. female s/p Lt TKA on 07/15/19 with PMH significant for OA, neuropathy, HLS, depression, HTN, DM, Acute Respiratory Failure due to COVID-19, Rt TKA.     Clinical Impression  [name] is a 69 y.o. feamel POD 0 s/p Lt TKA. Patient reports modified independence with RW for mobility at baseline. Patient is now limited by functional impairments (see PT problem list below) and requires min assist for transfers and gait with RW. Patient was able to ambulate ~55 feet with RW and min assist. Patient instructed in exercise to facilitate ROM and circulation. Patient will benefit from continued skilled PT interventions to address impairments and progress towards PLOF. Acute PT will follow to progress mobility and stair training in preparation for safe discharge home.     Follow Up Recommendations Follow surgeon's recommendation for DC plan and follow-up therapies;Home health PT    Equipment Recommendations  Rolling walker with 5" wheels    Recommendations for Other Services       Precautions / Restrictions Precautions Precautions: Fall Restrictions Weight Bearing Restrictions: Yes Other Position/Activity Restrictions: WBAT      Mobility  Bed Mobility Overal bed mobility: Needs Assistance Bed Mobility: Supine to Sit     Supine to sit: Min assist     General bed mobility comments: cues required for use of bed rail, assist needed to raise trunk up and to bring LE's to EOB.  Transfers Overall transfer level: Needs assistance Equipment used: Rolling walker (2 wheeled) Transfers: Sit to/from Stand Sit to Stand: Min assist            Ambulation/Gait Ambulation/Gait assistance: Min assist Gait Distance (Feet): 55 Feet Assistive device: Rolling walker (2 wheeled) Gait Pattern/deviations: Step-to  pattern;Decreased stride length;Decreased stance time - left;Decreased weight shift to left Gait velocity: decreased.   General Gait Details: verbal cues needed for safe step pattern and hand placement on RW. assist to manage walker position and safe proximity while negotiating turns and doorway.   Stairs       Wheelchair Mobility    Modified Rankin (Stroke Patients Only)       Balance Overall balance assessment: Needs assistance Sitting-balance support: Feet supported Sitting balance-Leahy Scale: Good     Standing balance support: During functional activity;Bilateral upper extremity supported Standing balance-Leahy Scale: Poor              Pertinent Vitals/Pain Pain Assessment: 0-10 Pain Score: 8  Pain Location: Lt TKA Pain Descriptors / Indicators: Aching Pain Intervention(s): Limited activity within patient's tolerance;Monitored during session;Repositioned;Ice applied    Home Living Family/patient expects to be discharged to:: Private residence Living Arrangements: Spouse/significant other;Children Available Help at Discharge: Family Type of Home: House Home Access: Stairs to enter Entrance Stairs-Rails: Can reach both(both entrances have rails) Entrance Stairs-Number of Steps: 3 in back and 6 at front Home Layout: One level Home Equipment: [place] - 2 wheels;Walker - 4 wheels;Cane - single point;Grab bars - toilet;Grab bars - tub/shower;Bedside commode;Electric scooter(lift chair, and scooter in storgae) Additional Comments: pt recently moved in November to a new home    Prior Function Level of Independence: Independent   Gait / Transfers Assistance Needed: using RW for gait           Hand Dominance   Dominant Hand: Right    Extremity/Trunk Assessment   Upper Extremity Assessment Upper Extremity  Assessment: Overall WFL for tasks assessed    Lower Extremity Assessment Lower Extremity Assessment: LLE deficits/detail LLE Deficits / Details: good  quad activation, no extensor lag with SLR LLE Sensation: WNL LLE Coordination: WNL    Cervical / Trunk Assessment Cervical / Trunk Assessment: Normal  Communication   Communication: No difficulties  Cognition Arousal/Alertness: Awake/alert Behavior During Therapy: WFL for tasks assessed/performed Overall Cognitive Status: Within Functional Limits for tasks assessed         General Comments      Exercises Total Joint Exercises Ankle Circles/Pumps: AROM;Both;20 reps;Seated Quad Sets: AROM;Left;5 reps;Seated Heel Slides: AROM;Left;5 reps;Seated   Assessment/Plan    PT Assessment Patient needs continued PT services  PT Problem List Decreased strength;Decreased range of motion;Decreased activity tolerance;Decreased balance;Decreased mobility;Decreased knowledge of use of DME;Decreased knowledge of precautions       PT Treatment Interventions DME instruction;Gait training;Stair training;Functional mobility training;Therapeutic activities;Therapeutic exercise;Balance training;Patient/family education    PT Goals (Current goals can be found in the Care Plan section)  Acute Rehab PT Goals Patient Stated Goal: to get back to gardening PT Goal Formulation: With patient Time For Goal Achievement: 07/22/19 Potential to Achieve Goals: Good    Frequency 7X/week    AM-PAC PT "6 Clicks" Mobility  Outcome Measure Help needed turning from your back to your side while in a flat bed without using bedrails?: None Help needed moving from lying on your back to sitting on the side of a flat bed without using bedrails?: A Little Help needed moving to and from a bed to a chair (including a wheelchair)?: A Little Help needed standing up from a chair using your arms (e.g., wheelchair or bedside chair)?: A Little Help needed to walk in hospital room?: A Little Help needed climbing 3-5 steps with a railing? : A Little 6 Click Score: 19    End of Session Equipment Utilized During Treatment:  Gait belt Activity Tolerance: Patient tolerated treatment well Patient left: in chair;with call bell/phone within reach;with chair alarm set Nurse Communication: Mobility status PT Visit Diagnosis: Muscle weakness (generalized) (M62.81);Difficulty in walking, not elsewhere classified (R26.2)    Time: 4270-6237 PT Time Calculation (min) (ACUTE ONLY): 28 min   Charges:   PT Evaluation $PT Eval Low Complexity: 1 Low PT Treatments $Gait Training: 8-22 mins        Wynn Maudlin, DPT Physical Therapist with [facility] [phone]  07/15/2019 7:01 PM

## 2019-07-16 ENCOUNTER — Encounter: Payer: Self-pay | Admitting: *Deleted

## 2019-07-16 DIAGNOSIS — M1712 Unilateral primary osteoarthritis, left knee: Secondary | ICD-10-CM | POA: Diagnosis not present

## 2019-07-16 LAB — CBC
HCT: 38 % (ref 36.0–46.0)
Hemoglobin: 11.8 g/dL — ABNORMAL LOW (ref 12.0–15.0)
MCH: 28.8 pg (ref 26.0–34.0)
MCHC: 31.1 g/dL (ref 30.0–36.0)
MCV: 92.7 fL (ref 80.0–100.0)
Platelets: 267 10*3/uL (ref 150–400)
RBC: 4.1 MIL/uL (ref 3.87–5.11)
RDW: 14.4 % (ref 11.5–15.5)
WBC: 14.5 10*3/uL — ABNORMAL HIGH (ref 4.0–10.5)
nRBC: 0 % (ref 0.0–0.2)

## 2019-07-16 LAB — BASIC METABOLIC PANEL
Anion gap: 11 (ref 5–15)
BUN: 16 mg/dL (ref 8–23)
CO2: 25 mmol/L (ref 22–32)
Calcium: 8.9 mg/dL (ref 8.9–10.3)
Chloride: 101 mmol/L (ref 98–111)
Creatinine, Ser: 0.91 mg/dL (ref 0.44–1.00)
GFR calc Af Amer: >60 mL/min (ref >60)
GFR calc non Af Amer: >60 mL/min (ref >60)
Glucose, Bld: 169 mg/dL — ABNORMAL HIGH (ref 70–99)
Potassium: 4.4 mmol/L (ref 3.5–5.1)
Sodium: 137 mmol/L (ref 135–145)

## 2019-07-16 MED ORDER — OXYCODONE HCL 5 MG PO TABS: 5.0000 mg | ORAL_TABLET | Freq: Four times a day (QID) | ORAL | 0 refills | Status: DC | PRN

## 2019-07-16 MED ORDER — ASPIRIN 325 MG PO TBEC: 325.0000 mg | DELAYED_RELEASE_TABLET | Freq: Two times a day (BID) | ORAL | 0 refills | Status: DC

## 2019-07-16 NOTE — Progress Notes (Signed)
SPORTS MEDICINE AND JOINT REPLACEMENT  Georgena Spurling, MD    Laurier Nancy, PA-C [address] [place], [place], [place]  [address]                             [phone]   PROGRESS NOTE  Subjective:  negative for Chest Pain  negative for Shortness of Breath  negative for Nausea/Vomiting   negative for Calf Pain  negative for Bowel Movement   Tolerating Diet: yes         Patient reports pain as 3 on 0-10 scale.    Objective: Vital signs in last 24 hours:    Patient Vitals for the past 24 hrs:  BP Temp Temp src Pulse Resp SpO2 Height Weight  07/16/19 0638 106/71 98.4 F (36.9 C) Oral 86 18 94 % -- --  07/16/19 0305 119/60 98 F (36.7 C) Oral 86 16 94 % -- --  07/15/19 2150 (!) 110/59 98.7 F (37.1 C) -- 86 18 91 % -- --  07/15/19 1823 (!) 118/51 98 F (36.7 C) Oral 90 18 93 % -- --  07/15/19 1537 118/61 97.7 F (36.5 C) Oral 81 17 95 % -- --  07/15/19 1419 117/61 97.6 F (36.4 C) Oral 91 18 96 % -- --  07/15/19 1324 137/68 97.6 F (36.4 C) Oral 84 17 98 % -- --  07/15/19 1300 -- -- -- -- -- -- 5\' 3"  (1.6 m) 92.5 kg  07/15/19 1234 128/88 97.6 F (36.4 C) Oral 80 14 99 % -- --  07/15/19 1215 133/73 97.7 F (36.5 C) -- 73 13 95 % -- --  07/15/19 1200 135/71 -- -- 76 18 100 % -- --  07/15/19 1145 129/70 -- -- 73 12 100 % -- --  07/15/19 1130 115/72 -- -- 80 (!) 24 100 % -- --  07/15/19 1115 117/63 -- -- 83 15 100 % -- --  07/15/19 1107 113/66 97.7 F (36.5 C) -- 85 16 100 % -- --  07/15/19 0851 -- -- -- 83 17 99 % -- --  07/15/19 0850 113/69 -- -- 81 (!) 25 98 % -- --  07/15/19 0849 -- -- -- 83 18 100 % -- --  07/15/19 0848 -- -- -- 82 13 100 % -- --  07/15/19 0847 -- -- -- 87 17 97 % -- --  07/15/19 0846 134/65 -- -- 81 15 100 % -- --  07/15/19 0845 -- -- -- 85 14 100 % -- --  07/15/19 0844 -- -- -- 83 17 100 % -- --  07/15/19 0843 -- -- -- (!) 168 19 96 % -- --  07/15/19 0842 -- -- -- 100 15 (!) 89 % -- --  07/15/19 0841 132/87 -- -- 73 17 90 % -- --     @flow {1959:LAST@   Intake/Output from previous day:   04/19 0701 - 04/20 0700 In: 3261.3 [P.O.:480; I.V.:2481.3] Out: 1500 [Urine:1400]   Intake/Output this shift:   No intake/output data recorded.   Intake/Output      04/19 0701 - 04/20 0700 04/20 0701 - 04/21 0700   P.O. 480    I.V. (mL/kg) 2481.3 (26.8)    IV Piggyback 300    Total Intake(mL/kg) 3261.3 (35.3)    Urine (mL/kg/hr) 1400 (0.6)    Blood 100    Total Output 1500    Net +1761.3            LABORATORY  DATA: Recent Labs    07/11/19 1151 07/16/19 0307  WBC 9.4 14.5*  HGB 13.8 11.8*  HCT 44.3 38.0  PLT 291 267   Recent Labs    07/11/19 1151 07/16/19 0307  NA 136 137  K 4.3 4.4  CL 98 101  CO2 27 25  BUN 13 16  CREATININE 0.80 0.91  GLUCOSE 99 169*  CALCIUM 9.1 8.9   No results found for: INR, PROTIME  Examination:  General appearance: alert, cooperative and no distress Extremities: extremities normal, atraumatic, no cyanosis or edema  Wound Exam: clean, dry, intact   Drainage:  None: wound tissue dry  Motor Exam: Quadriceps and Hamstrings Intact  Sensory Exam: Superficial Peroneal, Deep Peroneal and Tibial normal   Assessment:    1 Day Post-Op  Procedure(s) (LRB): TOTAL KNEE ARTHROPLASTY (Left)  ADDITIONAL DIAGNOSIS:  Active Problems:   S/P total knee replacement     Plan: Physical Therapy as ordered Weight Bearing as Tolerated (WBAT)  DVT Prophylaxis:  Aspirin  DISCHARGE PLAN: Home  Patient doing well, limited by pain. Should discharge home today       Patient's anticipated LOS is less than 2 midnights, meeting these requirements: - Lives within 1 hour of care - Has a competent adult at home to recover with post-op recover - NO history of  - Chronic pain requiring opiods  - Diabetes  - Coronary Artery Disease  - Heart failure  - Heart attack  - Stroke  - DVT/VTE  - Cardiac arrhythmia  - Respiratory Failure/COPD  - Renal failure  - Anemia  - Advanced Liver  disease        Donia Ast 07/16/2019, 7:29 AM

## 2019-07-16 NOTE — TOC Progression Note (Signed)
Transition of Care [facility]) - Progression Note    Patient Details  Name: Hailey Greene MRN: [id number] Date of Birth: 02-May-1950  [facility] of [facility]) CM/SW Contact  Clearance Coots, LCSW Phone Number: 07/16/2019, 2:47 PM  Clinical Narrative:    3 IN 1 ordered through [employer] and delivered to bedside   Expected Discharge Plan: Home/Self Care Barriers to Discharge: Barriers Resolved  Expected Discharge Plan and Services Expected Discharge Plan: Home/Self Care         Expected Discharge Date: 07/16/19               DME Arranged: 3-N-1 DME Agency: [employer] Date DME Agency Contacted: 07/16/19 Time DME Agency Contacted: [phone] Representative spoke with at DME Agency: Onsite order HH Arranged: NA HH Agency: NA         Social Determinants of Health (SDOH) Interventions    Readmission Risk Interventions No flowsheet data found.

## 2019-07-16 NOTE — TOC Progression Note (Signed)
Transition of Care [facility]) - Progression Note    Patient Details  Name: Hailey Greene MRN: [id number] Date of Birth: [birth year]  Transition of Care [facility]) CM/SW Contact  Clearance Coots, LCSW Phone Number: 07/16/2019, 9:32 AM  Clinical Narrative:    Therapy Plan: OPPT,  DME-Has RW and CPM   Expected Discharge Plan: Home/Self Care Barriers to Discharge: Barriers Resolved  Expected Discharge Plan and Services Expected Discharge Plan: Home/Self Care         Expected Discharge Date: 07/16/19               DME Arranged: N/A(Has RW and CPM) DME Agency: NA       HH Arranged: NA HH Agency: NA         Social Determinants of Health (SDOH) Interventions    Readmission Risk Interventions No flowsheet data found.

## 2019-07-16 NOTE — Progress Notes (Signed)
Physical Therapy Treatment Patient Details Name: Hailey Greene MRN: [id number] DOB: Jan 13, 1951 Today's Date: 07/16/2019    History of Present Illness Patient is 69 y.o. female s/p Lt TKA on 07/15/19 with PMH significant for OA, neuropathy, HLS, depression, HTN, DM, Acute Respiratory Failure due to COVID-19, Rt TKA.     PT Comments    POD # 1 am session Assisted to bathroom.  Assisted with amb in hallway and practiced stairs.  Then returned to room to perform some TE's following HEP handout.  Instructed on proper tech, freq as well as use of ICE.     Follow Up Recommendations  Follow surgeon's recommendation for DC plan and follow-up therapies;Home health PT     Equipment Recommendations  Rolling walker with 5" wheels;3in1 (PT)    Recommendations for Other Services       Precautions / Restrictions Precautions Precautions: Fall Precaution Comments: reviewed no pillow under knee Restrictions Weight Bearing Restrictions: No Other Position/Activity Restrictions: WBAT    Mobility  Bed Mobility               General bed mobility comments: OOB in recliner  Transfers Overall transfer level: Needs assistance Equipment used: Rolling walker (2 wheeled) Transfers: Sit to/from [employer] Sit to Stand: Supervision;Min guard Stand pivot transfers: Min guard       General transfer comment: 25% VC's on proper hand placement and safety with turns  Ambulation/Gait Ambulation/Gait assistance: Supervision;Min guard Gait Distance (Feet): 65 Feet Assistive device: Rolling walker (2 wheeled) Gait Pattern/deviations: Step-to pattern;Decreased stride length;Decreased stance time - left;Decreased weight shift to left Gait velocity: decreased.   General Gait Details: 25% VC's on proper walker to self distance and safety with turns   Stairs Stairs: Yes Stairs assistance: Supervision;Min guard Stair Management: Two rails;Step to pattern;Forwards Number of  Stairs: 2 General stair comments: 25% VC's on proper sequencing and safety   Wheelchair Mobility    Modified Rankin (Stroke Patients Only)       Balance                                            Cognition Arousal/Alertness: Awake/alert Behavior During Therapy: WFL for tasks assessed/performed Overall Cognitive Status: Within Functional Limits for tasks assessed                                 General Comments: AxO x 3      Exercises   Total Knee Replacement TE's following HEP handout 10 reps B LE ankle pumps 05 reps towel squeezes 05 reps knee presses 05 reps heel slides  05 reps SAQ's 05 reps SLR's 05 reps ABD Educated on use of gait belt to assist with TE's Followed by ICE     General Comments        Pertinent Vitals/Pain Pain Assessment: 0-10 Pain Score: 7  Pain Location: Lt TKA Pain Descriptors / Indicators: Aching;Grimacing;Operative site guarding;Tender Pain Intervention(s): Monitored during session;Premedicated before session;Repositioned;Ice applied    Home Living                      Prior Function            PT Goals (current goals can now be found in the care plan section) Progress towards PT goals: Progressing toward goals  Frequency    7X/week      PT Plan Current plan remains appropriate    Co-evaluation              AM-PAC PT "6 Clicks" Mobility   Outcome Measure  Help needed turning from your back to your side while in a flat bed without using bedrails?: A Little Help needed moving from lying on your back to sitting on the side of a flat bed without using bedrails?: A Little Help needed moving to and from a bed to a chair (including a wheelchair)?: A Little Help needed standing up from a chair using your arms (e.g., wheelchair or bedside chair)?: A Little Help needed to walk in hospital room?: A Little Help needed climbing 3-5 steps with a railing? : A Little 6 Click Score:  18    End of Session   Activity Tolerance: Patient tolerated treatment well Patient left: in chair;with call bell/phone within reach;with chair alarm set Nurse Communication: Mobility status PT Visit Diagnosis: Muscle weakness (generalized) (M62.81);Difficulty in walking, not elsewhere classified (R26.2)     Time: 1100-1140 PT Time Calculation (min) (ACUTE ONLY): 40 min  Charges:  $Gait Training: 8-22 mins $Therapeutic Exercise: 8-22 mins $Therapeutic Activity: 8-22 mins                     Rica Koyanagi  PTA Acute  Rehabilitation Services Pager      [phone] Office      [phone]

## 2019-07-16 NOTE — Progress Notes (Signed)
Physical Therapy Treatment Patient Details Name: Hailey Greene MRN: [id number] DOB: 1950/05/08 Today's Date: 07/16/2019    History of Present Illness Patient is 69 y.o. female s/p Lt TKA on 07/15/19 with PMH significant for OA, neuropathy, HLS, depression, HTN, DM, Acute Respiratory Failure due to COVID-19, Rt TKA.     PT Comments    POD # 1 pm session Assisted to bathroom.  Assisted with amb a greater distance in hallway. Addressed all mobility questions, discussed appropriate activity, educated on use of ICE.  Pt ready for D/C to home.   Follow Up Recommendations  Follow surgeon's recommendation for DC plan and follow-up therapies;Home health PT     Equipment Recommendations  Rolling walker with 5" wheels;3in1 (PT)    Recommendations for Other Services       Precautions / Restrictions Precautions Precautions: Fall Precaution Comments: reviewed no pillow under knee Restrictions Weight Bearing Restrictions: No Other Position/Activity Restrictions: WBAT    Mobility  Bed Mobility               General bed mobility comments: OOB in recliner  Transfers Overall transfer level: Needs assistance Equipment used: Rolling walker (2 wheeled) Transfers: Sit to/from [employer] Sit to Stand: Supervision;Min guard Stand pivot transfers: Min guard       General transfer comment: 25% VC's on proper hand placement and safety with turns  Ambulation/Gait Ambulation/Gait assistance: Supervision;Min guard Gait Distance (Feet): 75 Feet Assistive device: Rolling walker (2 wheeled) Gait Pattern/deviations: Step-to pattern;Decreased stride length;Decreased stance time - left;Decreased weight shift to left Gait velocity: decreased.   General Gait Details: 25% VC's on proper walker to self distance and safety with turns   Stairs Wheelchair Mobility    Modified Rankin (Stroke Patients Only)       Balance                                             Cognition Arousal/Alertness: Awake/alert Behavior During Therapy: WFL for tasks assessed/performed Overall Cognitive Status: Within Functional Limits for tasks assessed                                 General Comments: AxO x 3      Exercises      General Comments        Pertinent Vitals/Pain Pain Assessment: 0-10 Pain Score: 7  Pain Location: Lt TKA Pain Descriptors / Indicators: Aching;Grimacing;Operative site guarding;Tender Pain Intervention(s): Monitored during session;Premedicated before session;Repositioned;Ice applied    Home Living                      Prior Function            PT Goals (current goals can now be found in the care plan section) Progress towards PT goals: Progressing toward goals    Frequency    7X/week      PT Plan Current plan remains appropriate    Co-evaluation              AM-PAC PT "6 Clicks" Mobility   Outcome Measure  Help needed turning from your back to your side while in a flat bed without using bedrails?: A Little Help needed moving from lying on your back to sitting on the side of a flat bed without using bedrails?:  A Little Help needed moving to and from a bed to a chair (including a wheelchair)?: A Little Help needed standing up from a chair using your arms (e.g., wheelchair or bedside chair)?: A Little Help needed to walk in hospital room?: A Little Help needed climbing 3-5 steps with a railing? : A Little 6 Click Score: 18    End of Session   Activity Tolerance: Patient tolerated treatment well Patient left: in chair;with call bell/phone within reach;with chair alarm set Nurse Communication: Mobility status PT Visit Diagnosis: Muscle weakness (generalized) (M62.81);Difficulty in walking, not elsewhere classified (R26.2)     Time: 1315-1340 PT Time Calculation (min) (ACUTE ONLY): 25 min  Charges:  $Gait Training: 8-22 mins $Therapeutic Activity: 8-22 mins                      Felecia Shelling  PTA Acute  Rehabilitation Services Pager      [phone] Office      [phone]

## 2019-07-16 NOTE — Plan of Care (Signed)

## 2019-07-16 NOTE — Discharge Summary (Signed)
SPORTS MEDICINE & JOINT REPLACEMENT   Georgena Spurling, MD   Laurier Nancy, PA-C [address] [place], [place], [place]  [address]                             [phone]  PATIENT ID: Hailey Greene        MRN:  [id number]          DOB/AGE: June 05, 1950 / 69 y.o.    DISCHARGE SUMMARY  ADMISSION DATE:    07/15/2019 DISCHARGE DATE:   07/16/2019   ADMISSION DIAGNOSIS: S/P total knee replacement [Z96.659]    DISCHARGE DIAGNOSIS:  Primary Osteoarthritis Left Knee    ADDITIONAL DIAGNOSIS: Active Problems:   S/P total knee replacement  Past Medical History:  Diagnosis Date  . Acute hypoxemic respiratory failure due to COVID-19 (HCC)   . Chicken pox   . Depression   . Diabetes (HCC)   . GERD (gastroesophageal reflux disease)   . Hypertension   . Idiopathic progressive neuropathy   . Major depressive disorder   . Mixed hyperlipidemia   . Neuropathy   . Osteoarthritis   . Pneumonia   . Primary insomnia   . RLS (restless legs syndrome)   . Tachycardia   . Urge incontinence   . UTI (lower urinary tract infection)   . Weakness     PROCEDURE: Procedure(s): TOTAL KNEE ARTHROPLASTY on 07/15/2019  CONSULTS:    HISTORY:  See H&P in chart  HOSPITAL COURSE:  Hailey Greene is a 69 y.o. admitted on 07/15/2019 and found to have a diagnosis of Primary Osteoarthritis Left Knee.  After appropriate laboratory studies were obtained  they were taken to the operating room on 07/15/2019 and underwent Procedure(s): TOTAL KNEE ARTHROPLASTY.   They were given perioperative antibiotics:  Anti-infectives (From admission, onward)   Start     Dose/Rate Route Frequency Ordered Stop   07/15/19 1600  ceFAZolin (ANCEF) IVPB 2g/100 mL premix     2 g 200 mL/hr over 30 Minutes Intravenous Every 6 hours 07/15/19 1240 07/15/19 2220   07/15/19 0645  ceFAZolin (ANCEF) IVPB 2g/100 mL premix     2 g 200 mL/hr over 30 Minutes Intravenous On call to O.R. 07/15/19 [id number] 07/15/19 0948    .  Patient given  tranexamic acid IV or topical and exparel intra-operatively.  Tolerated the procedure well.    POD# 1: Vital signs were stable.  Patient denied Chest pain, shortness of breath, or calf pain.  Patient was started on Aspirin twice daily at 8am.  Consults to PT, OT, and care management were made.  The patient was weight bearing as tolerated.  CPM was placed on the operative leg 0-90 degrees for 6-8 hours a day. When out of the CPM, patient was placed in the foam block to achieve full extension. Incentive spirometry was taught.  Dressing was changed.       POD #2, Continued  PT for ambulation and exercise program.  IV saline locked.  O2 discontinued.    The remainder of the hospital course was dedicated to ambulation and strengthening.   The patient was discharged on 1 Day Post-Op in  Good condition.  Blood products given:none  DIAGNOSTIC STUDIES: Recent vital signs:  Patient Vitals for the past 24 hrs:  BP Temp Temp src Pulse Resp SpO2 Height Weight  07/16/19 0638 106/71 98.4 F (36.9 C) Oral 86 18 94 % -- --  07/16/19 0305 119/60 98 F (  36.7 C) Oral 86 16 94 % -- --  07/15/19 2150 (!) 110/59 98.7 F (37.1 C) -- 86 18 91 % -- --  07/15/19 1823 (!) 118/51 98 F (36.7 C) Oral 90 18 93 % -- --  07/15/19 1537 118/61 97.7 F (36.5 C) Oral 81 17 95 % -- --  07/15/19 1419 117/61 97.6 F (36.4 C) Oral 91 18 96 % -- --  07/15/19 1324 137/68 97.6 F (36.4 C) Oral 84 17 98 % -- --  07/15/19 1300 -- -- -- -- -- -- 5\' 3"  (1.6 m) 92.5 kg  07/15/19 1234 128/88 97.6 F (36.4 C) Oral 80 14 99 % -- --  07/15/19 1215 133/73 97.7 F (36.5 C) -- 73 13 95 % -- --  07/15/19 1200 135/71 -- -- 76 18 100 % -- --  07/15/19 1145 129/70 -- -- 73 12 100 % -- --  07/15/19 1130 115/72 -- -- 80 (!) 24 100 % -- --  07/15/19 1115 117/63 -- -- 83 15 100 % -- --  07/15/19 1107 113/66 97.7 F (36.5 C) -- 85 16 100 % -- --  07/15/19 0851 -- -- -- 83 17 99 % -- --  07/15/19 0850 113/69 -- -- 81 (!) 25 98 % -- --   07/15/19 0849 -- -- -- 83 18 100 % -- --  07/15/19 0848 -- -- -- 82 13 100 % -- --  07/15/19 0847 -- -- -- 87 17 97 % -- --  07/15/19 0846 134/65 -- -- 81 15 100 % -- --  07/15/19 0845 -- -- -- 85 14 100 % -- --  07/15/19 0844 -- -- -- 83 17 100 % -- --  07/15/19 0843 -- -- -- (!) 168 19 96 % -- --  07/15/19 0842 -- -- -- 100 15 (!) 89 % -- --  07/15/19 0841 132/87 -- -- 73 17 90 % -- --       Recent laboratory studies: Recent Labs    07/11/19 1151 07/16/19 0307  WBC 9.4 14.5*  HGB 13.8 11.8*  HCT 44.3 38.0  PLT 291 267   Recent Labs    07/11/19 1151 07/16/19 0307  NA 136 137  K 4.3 4.4  CL 98 101  CO2 27 25  BUN 13 16  CREATININE 0.80 0.91  GLUCOSE 99 169*  CALCIUM 9.1 8.9   No results found for: INR, PROTIME   Recent Radiographic Studies :  No results found.  DISCHARGE INSTRUCTIONS: Discharge Instructions    Call MD / Call 911   Complete by: As directed    If you experience chest pain or shortness of breath, CALL 911 and be transported to the hospital emergency room.  If you develope a fever above 101 F, pus (white drainage) or increased drainage or redness at the wound, or calf pain, call your surgeon's office.   Constipation Prevention   Complete by: As directed    Drink plenty of fluids.  Prune juice may be helpful.  You may use a stool softener, such as Colace (over the counter) 100 mg twice a day.  Use MiraLax (over the counter) for constipation as needed.   Diet - low sodium heart healthy   Complete by: As directed    Discharge instructions   Complete by: As directed    INSTRUCTIONS AFTER JOINT REPLACEMENT   Remove items at home which could result in a fall. This includes throw rugs or furniture in walking pathways ICE to  the affected joint every three hours while awake for 30 minutes at a time, for at least the first 3-5 days, and then as needed for pain and swelling.  Continue to use ice for pain and swelling. You may notice swelling that will progress  down to the foot and ankle.  This is normal after surgery.  Elevate your leg when you are not up walking on it.   Continue to use the breathing machine you got in the hospital (incentive spirometer) which will help keep your temperature down.  It is common for your temperature to cycle up and down following surgery, especially at night when you are not up moving around and exerting yourself.  The breathing machine keeps your lungs expanded and your temperature down.   DIET:  As you were doing prior to hospitalization, we recommend a well-balanced diet.  DRESSING / WOUND CARE / SHOWERING  Keep the surgical dressing until follow up.  The dressing is water proof, so you can shower without any extra covering.  IF THE DRESSING FALLS OFF or the wound gets wet inside, change the dressing with sterile gauze.  Please use good hand washing techniques before changing the dressing.  Do not use any lotions or creams on the incision until instructed by your surgeon.    ACTIVITY  Increase activity slowly as tolerated, but follow the weight bearing instructions below.   No driving for 6 weeks or until further direction given by your physician.  You cannot drive while taking narcotics.  No lifting or carrying greater than 10 lbs. until further directed by your surgeon. Avoid periods of inactivity such as sitting longer than an hour when not asleep. This helps prevent blood clots.  You may return to work once you are authorized by your doctor.     WEIGHT BEARING   Weight bearing as tolerated with assist device (walker, cane, etc) as directed, use it as long as suggested by your surgeon or therapist, typically at least 4-6 weeks.   EXERCISES  Results after joint replacement surgery are often greatly improved when you follow the exercise, range of motion and muscle strengthening exercises prescribed by your doctor. Safety measures are also important to protect the joint from further injury. Any time any of  these exercises cause you to have increased pain or swelling, decrease what you are doing until you are comfortable again and then slowly increase them. If you have problems or questions, call your caregiver or physical therapist for advice.   Rehabilitation is important following a joint replacement. After just a few days of immobilization, the muscles of the leg can become weakened and shrink (atrophy).  These exercises are designed to build up the tone and strength of the thigh and leg muscles and to improve motion. Often times heat used for twenty to thirty minutes before working out will loosen up your tissues and help with improving the range of motion but do not use heat for the first two weeks following surgery (sometimes heat can increase post-operative swelling).   These exercises can be done on a training (exercise) mat, on the floor, on a table or on a bed. Use whatever works the best and is most comfortable for you.    Use music or television while you are exercising so that the exercises are a pleasant break in your day. This will make your life better with the exercises acting as a break in your routine that you can look forward to.   Perform  all exercises about fifteen times, three times per day or as directed.  You should exercise both the operative leg and the other leg as well.   Exercises include:   Quad Sets - Tighten up the muscle on the front of the thigh (Quad) and hold for 5-10 seconds.   Straight Leg Raises - With your knee straight (if you were given a brace, keep it on), lift the leg to 60 degrees, hold for 3 seconds, and slowly lower the leg.  Perform this exercise against resistance later as your leg gets stronger.  Leg Slides: Lying on your back, slowly slide your foot toward your buttocks, bending your knee up off the floor (only go as far as is comfortable). Then slowly slide your foot back down until your leg is flat on the floor again.  Angel Wings: Lying on your back  spread your legs to the side as far apart as you can without causing discomfort.  Hamstring Strength:  Lying on your back, push your heel against the floor with your leg straight by tightening up the muscles of your buttocks.  Repeat, but this time bend your knee to a comfortable angle, and push your heel against the floor.  You may put a pillow under the heel to make it more comfortable if necessary.   A rehabilitation program following joint replacement surgery can speed recovery and prevent re-injury in the future due to weakened muscles. Contact your doctor or a physical therapist for more information on knee rehabilitation.    CONSTIPATION  Constipation is defined medically as fewer than three stools per week and severe constipation as less than one stool per week.  Even if you have a regular bowel pattern at home, your normal regimen is likely to be disrupted due to multiple reasons following surgery.  Combination of anesthesia, postoperative narcotics, change in appetite and fluid intake all can affect your bowels.   YOU MUST use at least one of the following options; they are listed in order of increasing strength to get the job done.  They are all available over the counter, and you may need to use some, POSSIBLY even all of these options:    Drink plenty of fluids (prune juice may be helpful) and high fiber foods Colace 100 mg by mouth twice a day  Senokot for constipation as directed and as needed Dulcolax (bisacodyl), take with full glass of water  Miralax (polyethylene glycol) once or twice a day as needed.  If you have tried all these things and are unable to have a bowel movement in the first 3-4 days after surgery call either your surgeon or your primary doctor.    If you experience loose stools or diarrhea, hold the medications until you stool forms back up.  If your symptoms do not get better within 1 week or if they get worse, check with your doctor.  If you experience "the  worst abdominal pain ever" or develop nausea or vomiting, please contact the office immediately for further recommendations for treatment.   ITCHING:  If you experience itching with your medications, try taking only a single pain pill, or even half a pain pill at a time.  You can also use Benadryl over the counter for itching or also to help with sleep.   TED HOSE STOCKINGS:  Use stockings on both legs until for at least 2 weeks or as directed by physician office. They may be removed at night for sleeping.  MEDICATIONS:  See  your medication summary on the "After Visit Summary" that nursing will review with you.  You may have some home medications which will be placed on hold until you complete the course of blood thinner medication.  It is important for you to complete the blood thinner medication as prescribed.  PRECAUTIONS:  If you experience chest pain or shortness of breath - call 911 immediately for transfer to the hospital emergency department.   If you develop a fever greater that 101 F, purulent drainage from wound, increased redness or drainage from wound, foul odor from the wound/dressing, or calf pain - CONTACT YOUR SURGEON.                                                   FOLLOW-UP APPOINTMENTS:  If you do not already have a post-op appointment, please call the office for an appointment to be seen by your surgeon.  Guidelines for how soon to be seen are listed in your "After Visit Summary", but are typically between 1-4 weeks after surgery.  OTHER INSTRUCTIONS:   Knee Replacement:  Do not place pillow under knee, focus on keeping the knee straight while resting. CPM instructions: 0-90 degrees, 2 hours in the morning, 2 hours in the afternoon, and 2 hours in the evening. Place foam block, curve side up under heel at all times except when in CPM or when walking.  DO NOT modify, tear, cut, or change the foam block in any way.   DENTAL ANTIBIOTICS:  In most cases prophylactic  antibiotics for Dental procdeures after total joint surgery are not necessary.  Exceptions are as follows:  1. History of prior total joint infection  2. Severely immunocompromised (Organ Transplant, cancer chemotherapy, Rheumatoid biologic meds such as Humera)  3. Poorly controlled diabetes (A1C &gt; 8.0, blood glucose over 200)  If you have one of these conditions, contact your surgeon for an antibiotic prescription, prior to your dental procedure.   MAKE SURE YOU:  Understand these instructions.  Get help right away if you are not doing well or get worse.    Thank you for letting us be a part of your medical care team.  It is a privilege we respect greatly.  We hope these instructions will help you stay on track for a fast and full recovery!   Increase activity slowly as tolerated   Complete by: As directed       DISCHARGE MEDICATIONS:   Allergies as of 07/16/2019   No Known Allergies     Medication List    STOP taking these medications   levofloxacin 500 MG tablet Commonly known as: LEVAQUIN   meloxicam 15 MG tablet Commonly known as: MOBIC   phentermine 37.5 MG tablet Commonly known as: ADIPEX-P     TAKE these medications   acetaminophen 500 MG tablet Commonly known as: TYLENOL Take 500-1,000 mg by mouth every 6 (six) hours as needed (for pain).   amitriptyline 50 MG tablet Commonly known as: ELAVIL Take 50 mg by mouth at bedtime.   aspirin 325 MG EC tablet Take 1 tablet (325 mg total) by mouth 2 (two) times daily.   atorvastatin 20 MG tablet Commonly known as: LIPITOR TAKE ONE (1) TABLET ONCE DAILY What changed: See the new instructions.   Belsomra 10 MG Tabs Generic drug: Suvorexant Take 10 mg by mouth at  bedtime as needed (sleep.).   cyclobenzaprine 10 MG tablet Commonly known as: FLEXERIL TAKE 1 TABLET 3 TIMES A DAY AS NEEDED What changed: See the new instructions.   dicyclomine 20 MG tablet Commonly known as: BENTYL TAKE ONE TABLET  EVERY 6 HOURS AS NEEDED What changed: See the new instructions.   DULoxetine 60 MG capsule Commonly known as: CYMBALTA Take 60 mg by mouth 2 (two) times daily.   hydrochlorothiazide 25 MG tablet Commonly known as: HYDRODIURIL Take 25 mg by mouth daily.   metFORMIN 500 MG tablet Commonly known as: GLUCOPHAGE Take 500 mg by mouth daily with breakfast.   mirtazapine 15 MG tablet Commonly known as: REMERON Take 15 mg by mouth at bedtime.   multivitamin with minerals Tabs tablet Take 1 tablet by mouth daily.   omeprazole 20 MG capsule Commonly known as: PRILOSEC TAKE ONE CAPSULE BY MOUTH DAILY BEFORE AMEAL What changed: See the new instructions.   ondansetron 4 MG tablet Commonly known as: ZOFRAN TAKE ONE TABLET BY MOUTH EVERY EIGHT HOURS AS NEEDED NAUSEA   OneTouch Delica Plus Lancet33G Misc   OneTouch Verio test strip Generic drug: glucose blood   oxyCODONE 5 MG immediate release tablet Commonly known as: Oxy IR/ROXICODONE Take 1-2 tablets (5-10 mg total) by mouth every 6 (six) hours as needed for moderate pain (pain score 4-6).   pregabalin 200 MG capsule Commonly known as: LYRICA TAKE ONE (1) CAPSULE THREE (3) TIMES EACH DAY What changed: See the new instructions.   rOPINIRole 3 MG tablet Commonly known as: REQUIP Take 3 mg by mouth at bedtime.   solifenacin 5 MG tablet Commonly known as: VESICARE TAKE ONE (1) TABLET ONCE DAILY What changed: See the new instructions.   Symbicort 160-4.5 MCG/ACT inhaler Generic drug: budesonide-formoterol Inhale 2 puffs into the lungs 2 times daily at 12 noon and 4 pm.   traMADol 50 MG tablet Commonly known as: ULTRAM Take 50-100 mg by mouth every 6 (six) hours as needed (pain).   Trintellix 10 MG Tabs tablet Generic drug: vortioxetine HBr TAKE 1 TABLET BY MOUTH ONCE DAILY AT [facility] TIME [facility] DAY What changed: See the new instructions.   Victoza 18 MG/3ML Sopn Generic drug: liraglutide INJECT 1.2 MG ONCE DAILY What  changed: See the new instructions.            Durable Medical Equipment  (From admission, onward)         Start     Ordered   07/15/19 1241  DME Walker rolling  Once    Question:  Patient needs a walker to treat with the following condition  Answer:  S/P total knee replacement   07/15/19 1240   07/15/19 1241  DME 3 n 1  Once     07/15/19 1240   07/15/19 1241  DME Bedside commode  Once    Question:  Patient needs a bedside commode to treat with the following condition  Answer:  S/P total knee replacement   07/15/19 1240          FOLLOW UP VISIT:    DISPOSITION: HOME VS. SNF  CONDITION:  Good   Guy Sandifer 07/16/2019, 7:33 AM

## 2019-07-19 ENCOUNTER — Emergency Department (HOSPITAL_COMMUNITY): Payer: 59

## 2019-07-19 ENCOUNTER — Other Ambulatory Visit: Payer: Self-pay

## 2019-07-19 ENCOUNTER — Encounter (HOSPITAL_COMMUNITY): Payer: Self-pay

## 2019-07-19 ENCOUNTER — Inpatient Hospital Stay (HOSPITAL_COMMUNITY)
Admission: EM | Admit: 2019-07-19 | Discharge: 2019-07-21 | DRG: 641 | Disposition: A | Discharge status: Home Health | Payer: 59 | Attending: Internal Medicine | Admitting: Internal Medicine

## 2019-07-19 ENCOUNTER — Telehealth: Payer: Self-pay

## 2019-07-19 DIAGNOSIS — R112 Nausea with vomiting, unspecified: Secondary | ICD-10-CM | POA: Diagnosis present

## 2019-07-19 DIAGNOSIS — F329 Major depressive disorder, single episode, unspecified: Secondary | ICD-10-CM | POA: Diagnosis present

## 2019-07-19 DIAGNOSIS — E1142 Type 2 diabetes mellitus with diabetic polyneuropathy: Secondary | ICD-10-CM | POA: Diagnosis present

## 2019-07-19 DIAGNOSIS — F419 Anxiety disorder, unspecified: Secondary | ICD-10-CM | POA: Diagnosis present

## 2019-07-19 DIAGNOSIS — Z79891 Long term (current) use of opiate analgesic: Secondary | ICD-10-CM

## 2019-07-19 DIAGNOSIS — Z833 Family history of diabetes mellitus: Secondary | ICD-10-CM

## 2019-07-19 DIAGNOSIS — Z96659 Presence of unspecified artificial knee joint: Secondary | ICD-10-CM

## 2019-07-19 DIAGNOSIS — F5101 Primary insomnia: Secondary | ICD-10-CM | POA: Diagnosis present

## 2019-07-19 DIAGNOSIS — Z90710 Acquired absence of both cervix and uterus: Secondary | ICD-10-CM

## 2019-07-19 DIAGNOSIS — Z7982 Long term (current) use of aspirin: Secondary | ICD-10-CM

## 2019-07-19 DIAGNOSIS — K219 Gastro-esophageal reflux disease without esophagitis: Secondary | ICD-10-CM | POA: Diagnosis present

## 2019-07-19 DIAGNOSIS — Z8349 Family history of other endocrine, nutritional and metabolic diseases: Secondary | ICD-10-CM

## 2019-07-19 DIAGNOSIS — E876 Hypokalemia: Secondary | ICD-10-CM | POA: Diagnosis not present

## 2019-07-19 DIAGNOSIS — Z96652 Presence of left artificial knee joint: Secondary | ICD-10-CM

## 2019-07-19 DIAGNOSIS — Z96653 Presence of artificial knee joint, bilateral: Secondary | ICD-10-CM | POA: Diagnosis present

## 2019-07-19 DIAGNOSIS — D649 Anemia, unspecified: Secondary | ICD-10-CM | POA: Diagnosis present

## 2019-07-19 DIAGNOSIS — Z20822 Contact with and (suspected) exposure to covid-19: Secondary | ICD-10-CM | POA: Diagnosis present

## 2019-07-19 DIAGNOSIS — I10 Essential (primary) hypertension: Secondary | ICD-10-CM | POA: Diagnosis not present

## 2019-07-19 DIAGNOSIS — E860 Dehydration: Secondary | ICD-10-CM | POA: Diagnosis not present

## 2019-07-19 DIAGNOSIS — Z7951 Long term (current) use of inhaled steroids: Secondary | ICD-10-CM

## 2019-07-19 DIAGNOSIS — I152 Hypertension secondary to endocrine disorders: Secondary | ICD-10-CM | POA: Diagnosis present

## 2019-07-19 DIAGNOSIS — G8929 Other chronic pain: Secondary | ICD-10-CM | POA: Diagnosis present

## 2019-07-19 DIAGNOSIS — G2581 Restless legs syndrome: Secondary | ICD-10-CM | POA: Diagnosis present

## 2019-07-19 DIAGNOSIS — E669 Obesity, unspecified: Secondary | ICD-10-CM | POA: Diagnosis present

## 2019-07-19 DIAGNOSIS — Z79899 Other long term (current) drug therapy: Secondary | ICD-10-CM

## 2019-07-19 DIAGNOSIS — M25562 Pain in left knee: Secondary | ICD-10-CM | POA: Diagnosis present

## 2019-07-19 DIAGNOSIS — Z6838 Body mass index (BMI) 38.0-38.9, adult: Secondary | ICD-10-CM

## 2019-07-19 DIAGNOSIS — R4182 Altered mental status, unspecified: Secondary | ICD-10-CM

## 2019-07-19 DIAGNOSIS — Z808 Family history of malignant neoplasm of other organs or systems: Secondary | ICD-10-CM

## 2019-07-19 DIAGNOSIS — M1990 Unspecified osteoarthritis, unspecified site: Secondary | ICD-10-CM | POA: Diagnosis present

## 2019-07-19 DIAGNOSIS — Z8249 Family history of ischemic heart disease and other diseases of the circulatory system: Secondary | ICD-10-CM

## 2019-07-19 DIAGNOSIS — J4520 Mild intermittent asthma, uncomplicated: Secondary | ICD-10-CM | POA: Diagnosis present

## 2019-07-19 DIAGNOSIS — G9340 Encephalopathy, unspecified: Secondary | ICD-10-CM | POA: Diagnosis not present

## 2019-07-19 DIAGNOSIS — E1159 Type 2 diabetes mellitus with other circulatory complications: Secondary | ICD-10-CM | POA: Diagnosis present

## 2019-07-19 DIAGNOSIS — E782 Mixed hyperlipidemia: Secondary | ICD-10-CM | POA: Diagnosis present

## 2019-07-19 DIAGNOSIS — Z7984 Long term (current) use of oral hypoglycemic drugs: Secondary | ICD-10-CM

## 2019-07-19 LAB — COMPREHENSIVE METABOLIC PANEL
ALT: 18 U/L (ref 0–44)
AST: 17 U/L (ref 15–41)
Albumin: 3.3 g/dL — ABNORMAL LOW (ref 3.5–5.0)
Alkaline Phosphatase: 84 U/L (ref 38–126)
Anion gap: 12 (ref 5–15)
BUN: 20 mg/dL (ref 8–23)
CO2: 23 mmol/L (ref 22–32)
Calcium: 8.7 mg/dL — ABNORMAL LOW (ref 8.9–10.3)
Chloride: 102 mmol/L (ref 98–111)
Creatinine, Ser: 0.7 mg/dL (ref 0.44–1.00)
GFR calc Af Amer: >60 mL/min (ref >60)
GFR calc non Af Amer: >60 mL/min (ref >60)
Glucose, Bld: 143 mg/dL — ABNORMAL HIGH (ref 70–99)
Potassium: 3.1 mmol/L — ABNORMAL LOW (ref 3.5–5.1)
Sodium: 137 mmol/L (ref 135–145)
Total Bilirubin: 1.2 mg/dL (ref 0.3–1.2)
Total Protein: 6.8 g/dL (ref 6.5–8.1)

## 2019-07-19 LAB — CBC
HCT: 38.3 % (ref 36.0–46.0)
Hemoglobin: 12.5 g/dL (ref 12.0–15.0)
MCH: 29 pg (ref 26.0–34.0)
MCHC: 32.6 g/dL (ref 30.0–36.0)
MCV: 88.9 fL (ref 80.0–100.0)
Platelets: 307 10*3/uL (ref 150–400)
RBC: 4.31 MIL/uL (ref 3.87–5.11)
RDW: 14.4 % (ref 11.5–15.5)
WBC: 12.2 10*3/uL — ABNORMAL HIGH (ref 4.0–10.5)
nRBC: 0 % (ref 0.0–0.2)

## 2019-07-19 LAB — RESPIRATORY PANEL BY RT PCR (FLU A&B, COVID)
Influenza A by PCR: NEGATIVE
Influenza B by PCR: NEGATIVE
SARS Coronavirus 2 by RT PCR: NEGATIVE

## 2019-07-19 LAB — AMMONIA: Ammonia: 10 umol/L (ref 9–35)

## 2019-07-19 LAB — TSH: TSH: 0.885 u[IU]/mL (ref 0.350–4.500)

## 2019-07-19 LAB — VITAMIN B12: Vitamin B-12: 114 pg/mL — ABNORMAL LOW (ref 180–914)

## 2019-07-19 LAB — MAGNESIUM: Magnesium: 2.2 mg/dL (ref 1.7–2.4)

## 2019-07-19 LAB — CBG MONITORING, ED: Glucose-Capillary: 144 mg/dL — ABNORMAL HIGH (ref 70–99)

## 2019-07-19 MED ORDER — POTASSIUM CHLORIDE CRYS ER 20 MEQ PO TBCR
40.0000 meq | EXTENDED_RELEASE_TABLET | Freq: Once | ORAL | Status: AC
  Administered 2019-07-19: 40 meq via ORAL
  Filled 2019-07-19: qty 2

## 2019-07-19 MED ORDER — METOCLOPRAMIDE HCL 5 MG/ML IJ SOLN
5.0000 mg | Freq: Once | INTRAMUSCULAR | Status: AC
  Administered 2019-07-19: 5 mg via INTRAVENOUS
  Filled 2019-07-19: qty 2

## 2019-07-19 MED ORDER — ENOXAPARIN SODIUM 40 MG/0.4ML ~~LOC~~ SOLN
40.0000 mg | Freq: Every day | SUBCUTANEOUS | Status: DC
  Administered 2019-07-19 – 2019-07-20 (×2): 40 mg via SUBCUTANEOUS
  Filled 2019-07-19 (×2): qty 0.4

## 2019-07-19 MED ORDER — SODIUM CHLORIDE 0.9% FLUSH
3.0000 mL | Freq: Two times a day (BID) | INTRAVENOUS | Status: DC
  Administered 2019-07-19 – 2019-07-21 (×2): 3 mL via INTRAVENOUS

## 2019-07-19 MED ORDER — CYCLOBENZAPRINE HCL 10 MG PO TABS
10.0000 mg | ORAL_TABLET | Freq: Every day | ORAL | Status: DC
  Filled 2019-07-19: qty 1

## 2019-07-19 MED ORDER — ATORVASTATIN CALCIUM 10 MG PO TABS
20.0000 mg | ORAL_TABLET | Freq: Every day | ORAL | Status: DC
  Administered 2019-07-20 – 2019-07-21 (×2): 20 mg via ORAL
  Filled 2019-07-19 (×2): qty 2

## 2019-07-19 MED ORDER — SODIUM CHLORIDE 0.9 % IV SOLN: INTRAVENOUS | Status: AC

## 2019-07-19 MED ORDER — HYDROCODONE-ACETAMINOPHEN 5-325 MG PO TABS: 1.0000 | ORAL_TABLET | Freq: Four times a day (QID) | ORAL | Status: DC | PRN

## 2019-07-19 MED ORDER — PREGABALIN 50 MG PO CAPS
200.0000 mg | ORAL_CAPSULE | Freq: Three times a day (TID) | ORAL | Status: DC
  Administered 2019-07-20 – 2019-07-21 (×3): 200 mg via ORAL
  Filled 2019-07-19 (×5): qty 4

## 2019-07-19 MED ORDER — VORTIOXETINE HBR 5 MG PO TABS
10.0000 mg | ORAL_TABLET | Freq: Every day | ORAL | Status: DC
  Administered 2019-07-21: 10 mg via ORAL
  Filled 2019-07-19 (×2): qty 2

## 2019-07-19 MED ORDER — ONDANSETRON HCL 4 MG/2ML IJ SOLN
4.0000 mg | Freq: Once | INTRAMUSCULAR | Status: AC
  Administered 2019-07-19: 4 mg via INTRAVENOUS
  Filled 2019-07-19: qty 2

## 2019-07-19 MED ORDER — ONDANSETRON HCL 4 MG/2ML IJ SOLN
4.0000 mg | Freq: Four times a day (QID) | INTRAMUSCULAR | Status: DC | PRN
  Administered 2019-07-19 – 2019-07-20 (×4): 4 mg via INTRAVENOUS
  Filled 2019-07-19 (×4): qty 2

## 2019-07-19 MED ORDER — SENNOSIDES-DOCUSATE SODIUM 8.6-50 MG PO TABS: 1.0000 | ORAL_TABLET | Freq: Every evening | ORAL | Status: DC | PRN

## 2019-07-19 MED ORDER — MOMETASONE FURO-FORMOTEROL FUM 200-5 MCG/ACT IN AERO
2.0000 | INHALATION_SPRAY | Freq: Two times a day (BID) | RESPIRATORY_TRACT | Status: DC
  Administered 2019-07-20 – 2019-07-21 (×2): 2 via RESPIRATORY_TRACT
  Filled 2019-07-19: qty 8.8

## 2019-07-19 MED ORDER — ACETAMINOPHEN 325 MG PO TABS
650.0000 mg | ORAL_TABLET | Freq: Four times a day (QID) | ORAL | Status: DC | PRN
  Administered 2019-07-21: 650 mg via ORAL
  Filled 2019-07-19: qty 2

## 2019-07-19 MED ORDER — DICYCLOMINE HCL 10 MG PO CAPS: 10.0000 mg | ORAL_CAPSULE | Freq: Four times a day (QID) | ORAL | Status: DC | PRN

## 2019-07-19 MED ORDER — DARIFENACIN HYDROBROMIDE ER 7.5 MG PO TB24
7.5000 mg | ORAL_TABLET | Freq: Every day | ORAL | Status: DC
  Administered 2019-07-20 – 2019-07-21 (×2): 7.5 mg via ORAL
  Filled 2019-07-19 (×2): qty 1

## 2019-07-19 MED ORDER — AMITRIPTYLINE HCL 25 MG PO TABS
50.0000 mg | ORAL_TABLET | Freq: Every day | ORAL | Status: DC
  Administered 2019-07-20: 22:00:00 50 mg via ORAL
  Filled 2019-07-19: qty 2

## 2019-07-19 MED ORDER — SODIUM CHLORIDE 0.9 % IV BOLUS
1000.0000 mL | Freq: Once | INTRAVENOUS | Status: AC
  Administered 2019-07-19: 1000 mL via INTRAVENOUS

## 2019-07-19 MED ORDER — SODIUM CHLORIDE 0.9% FLUSH: 3.0000 mL | Freq: Once | INTRAVENOUS | Status: DC

## 2019-07-19 MED ORDER — MIRTAZAPINE 15 MG PO TABS
15.0000 mg | ORAL_TABLET | Freq: Every day | ORAL | Status: DC
  Administered 2019-07-20: 15 mg via ORAL
  Filled 2019-07-19 (×2): qty 1

## 2019-07-19 MED ORDER — MORPHINE SULFATE (PF) 4 MG/ML IV SOLN: 4.0000 mg | INTRAVENOUS | Status: DC | PRN

## 2019-07-19 MED ORDER — BISACODYL 5 MG PO TBEC: 5.0000 mg | DELAYED_RELEASE_TABLET | Freq: Every day | ORAL | Status: DC | PRN

## 2019-07-19 MED ORDER — DULOXETINE HCL 30 MG PO CPEP
60.0000 mg | ORAL_CAPSULE | Freq: Two times a day (BID) | ORAL | Status: DC
  Administered 2019-07-20 – 2019-07-21 (×3): 60 mg via ORAL
  Filled 2019-07-19 (×4): qty 2

## 2019-07-19 MED ORDER — INSULIN ASPART 100 UNIT/ML ~~LOC~~ SOLN
0.0000 [IU] | Freq: Three times a day (TID) | SUBCUTANEOUS | Status: DC
  Administered 2019-07-20 – 2019-07-21 (×2): 1 [IU] via SUBCUTANEOUS

## 2019-07-19 MED ORDER — PANTOPRAZOLE SODIUM 40 MG PO TBEC
40.0000 mg | DELAYED_RELEASE_TABLET | Freq: Every day | ORAL | Status: DC
  Administered 2019-07-20 – 2019-07-21 (×2): 40 mg via ORAL
  Filled 2019-07-19 (×2): qty 1

## 2019-07-19 MED ORDER — ROPINIROLE HCL 1 MG PO TABS
3.0000 mg | ORAL_TABLET | Freq: Every day | ORAL | Status: DC
  Administered 2019-07-20: 3 mg via ORAL
  Filled 2019-07-19 (×2): qty 3

## 2019-07-19 NOTE — ED Triage Notes (Signed)
Patient had a left knee replacement in the past 2-3 days. Patient's husband states that the patient has not been right since surgery. Patient's husband states that the patient continuously states, "get me out of here." Patient ws saying the same things when writer saw the patient.

## 2019-07-19 NOTE — H&P (Signed)
History and Physical    Hailey Greene:[id number] DOB: 1951-02-27 DOA: 07/19/2019  PCP: Rochel Brome, MD   Patient coming from: Home   Chief Complaint: Left knee pain, confusion   HPI: Hailey Greene is a 69 y.o. female with medical history significant for type 2 diabetes mellitus, hypertension, BMI 36, depression, and chronic knee pain status post left total knee arthroplasty on 07/15/2019, now presenting to the emergency department with severe left knee pain and confusion.  The patient is accompanied by her husband who assists with the history.  Patient was discharged from the hospital on 07/16/2019 following her knee replacement, was given prescription for oxycodone in addition to her chronic pain medications she had already been taking, but has continued to complain of severe knee pain despite this and has been confused since returning home.  She has not complained of anything aside from her knee pain but has appeared anxious at times and frequently repeats something along the lines of "get me out of here."  Aside from the oxycodone, there has not been any new medications.  Patient has not fallen or hit her head.  She has not been eating or drinking much since returning home.  She had similar symptoms little over 10 years ago per report of her husband that was suspected secondary to medications at that time and had completely resolved.  ED Course: Upon arrival to the ED, patient is found to be afebrile, saturating well on room air, and with stable blood pressure.  Noncontrast head CT is negative for acute findings.  Chemistry panel with potassium 3.1.  CBC with leukocytosis that has decreased from recent hospitalization.  Patient was given a liter of saline and Zofran in the ED.  Urinalysis and Covid screening tests are pending.  Review of Systems:  All other systems reviewed and apart from HPI, are negative.  Past Medical History:  Diagnosis Date  . Acute hypoxemic respiratory failure  due to COVID-19 ([place])   . Chicken pox   . Depression   . Diabetes ([place])   . GERD (gastroesophageal reflux disease)   . Hypertension   . Idiopathic progressive neuropathy   . Major depressive disorder   . Mixed hyperlipidemia   . Neuropathy   . Osteoarthritis   . Pneumonia   . Primary insomnia   . RLS (restless legs syndrome)   . Tachycardia   . Urge incontinence   . UTI (lower urinary tract infection)   . Weakness     Past Surgical History:  Procedure Laterality Date  . ABDOMINAL HYSTERECTOMY    . APPENDECTOMY    . CHOLECYSTECTOMY    . HERNIA REPAIR    . REPLACEMENT TOTAL KNEE Right   . TONSILLECTOMY    . TOTAL KNEE ARTHROPLASTY Left 07/15/2019   Procedure: TOTAL KNEE ARTHROPLASTY;  Surgeon: Vickey Huger, MD;  Location: [facility];  Service: Orthopedics;  Laterality: Left;     reports that she quit smoking about 9 years ago. Her smoking use included cigarettes. She has a 24.00 pack-year smoking history. She has never used smokeless tobacco. She reports current alcohol use. She reports that she does not use drugs.  No Known Allergies  Family History  Problem Relation Age of Onset  . Thyroid disease Mother   . Cancer Mother   . Migraines Mother   . Brain cancer Father   . Heart disease Maternal Grandmother   . Diabetes Daughter      Prior to Admission medications   Medication  Sig Start Date End Date Taking? Authorizing Provider  acetaminophen (TYLENOL) 500 MG tablet Take 500-1,000 mg by mouth every 6 (six) hours as needed (for pain).   Yes [provider]  amitriptyline (ELAVIL) 50 MG tablet Take 50 mg by mouth at bedtime. 07/08/19  Yes [provider]  aspirin EC 325 MG EC tablet Take 1 tablet (325 mg total) by mouth 2 (two) times daily. 07/16/19  Yes Guy Sandifer, PA  atorvastatin (LIPITOR) 20 MG tablet TAKE ONE (1) TABLET ONCE DAILY Patient taking differently: Take 20 mg by mouth daily.  06/26/19  Yes Marianne Sofia, PA-C  BELSOMRA 10 MG TABS Take  10 mg by mouth at bedtime as needed (sleep.).  01/11/19  Yes [provider]  cyclobenzaprine (FLEXERIL) 10 MG tablet TAKE 1 TABLET 3 TIMES A DAY AS NEEDED Patient taking differently: Take 10 mg by mouth at bedtime.  06/27/19  Yes Marianne Sofia, PA-C  dicyclomine (BENTYL) 20 MG tablet TAKE ONE TABLET EVERY 6 HOURS AS NEEDED Patient taking differently: Take 10 mg by mouth every 6 (six) hours as needed (stomach problems/abdominal spasms.).  06/27/19  Yes Marianne Sofia, PA-C  DULoxetine (CYMBALTA) 60 MG capsule Take 60 mg by mouth 2 (two) times daily.  06/27/19  Yes [provider]  hydrochlorothiazide (HYDRODIURIL) 25 MG tablet Take 25 mg by mouth daily.   Yes [provider]  metFORMIN (GLUCOPHAGE) 500 MG tablet Take 500 mg by mouth daily with breakfast.   Yes [provider]  mirtazapine (REMERON) 15 MG tablet Take 15 mg by mouth at bedtime. 04/23/19  Yes [provider]  Multiple Vitamin (MULTIVITAMIN WITH MINERALS) TABS tablet Take 1 tablet by mouth daily.   Yes [provider]  omeprazole (PRILOSEC) 20 MG capsule TAKE ONE CAPSULE BY MOUTH DAILY BEFORE AMEAL Patient taking differently: Take 20 mg by mouth daily.  06/11/19  Yes Marianne Sofia, PA-C  ondansetron (ZOFRAN) 4 MG tablet TAKE ONE TABLET BY MOUTH EVERY EIGHT HOURS AS NEEDED NAUSEA Patient taking differently: Take 4 mg by mouth every 8 (eight) hours as needed for nausea.  06/06/19  Yes Cox, Kirsten, MD  oxyCODONE (OXY IR/ROXICODONE) 5 MG immediate release tablet Take 1-2 tablets (5-10 mg total) by mouth every 6 (six) hours as needed for moderate pain (pain score 4-6). 07/16/19  Yes Guy Sandifer, PA  pregabalin (LYRICA) 200 MG capsule TAKE ONE (1) CAPSULE THREE (3) TIMES EACH DAY Patient taking differently: Take 200 mg by mouth in the morning, at noon, and at bedtime.  06/27/19  Yes Marianne Sofia, PA-C  rOPINIRole (REQUIP) 3 MG tablet Take 3 mg by mouth at bedtime. 06/27/19  Yes [provider]   solifenacin (VESICARE) 5 MG tablet TAKE ONE (1) TABLET ONCE DAILY Patient taking differently: Take 5 mg by mouth daily.  06/27/19  Yes Marianne Sofia, PA-C  SYMBICORT 160-4.5 MCG/ACT inhaler Inhale 2 puffs into the lungs 2 times daily at 12 noon and 4 pm. 05/31/19  Yes Cox, Kirsten, MD  traMADol (ULTRAM) 50 MG tablet Take 50-100 mg by mouth every 6 (six) hours as needed (pain).   Yes [provider]  TRINTELLIX 10 MG TABS tablet TAKE 1 TABLET BY MOUTH ONCE DAILY AT [facility] TIME [facility] DAY Patient taking differently: Take 10 mg by mouth daily.  07/08/19  Yes Cox, Kirsten, MD  VICTOZA 18 MG/3ML SOPN INJECT 1.2 MG ONCE DAILY Patient taking differently: Inject 1.6 mg into the skin daily.  06/13/19  Yes Cox, Kirsten,  MD  glucose blood (ONETOUCH VERIO) test strip  09/11/18   [provider]  Lancets [facility] [place] PLUS [place]) MISC  09/11/18   [provider]    Physical Exam: Vitals:   07/19/19 1750 07/19/19 1800 07/19/19 1815 07/19/19 2000  BP: 138/67 125/71 132/63 (!) 154/78  Pulse: 93 91 91 92  Resp: 16   16  Temp:      TempSrc:      SpO2: 99% 97% 96% 95%  Weight:      Height:        Constitutional: NAD, calm  Eyes: PERTLA, lids and conjunctivae normal ENMT: Mucous membranes are moist. Posterior pharynx clear of any exudate or lesions.   Neck: normal, supple, no masses, no thyromegaly Respiratory:  no wheezing, no crackles. No accessory muscle use.  Cardiovascular: S1 & S2 heard, regular rate and rhythm. Mild LE edema.   Abdomen: No distension, no tenderness, soft. Bowel sounds active.  Musculoskeletal: no clubbing / cyanosis. No joint deformity upper and lower extremities.   Skin: Left knee slight erythema and dry/clean surgical dressing. Warm, dry, well-perfused. Neurologic: CN 2-12 grossly intact. Sensation intact. Strength 5/5 in all 4 limbs.  Psychiatric: Alert. Oriented to person and place only. Anxious. Cooperative.    Labs and Imaging on Admission: I  have personally reviewed following labs and imaging studies  CBC: Recent Labs  Lab 07/16/19 0307 07/19/19 1820  WBC 14.5* 12.2*  HGB 11.8* 12.5  HCT 38.0 38.3  MCV 92.7 88.9  PLT 267 307   Basic Metabolic Panel: Recent Labs  Lab 07/16/19 0307 07/19/19 1820  NA 137 137  K 4.4 3.1*  CL 101 102  CO2 25 23  GLUCOSE 169* 143*  BUN 16 20  CREATININE 0.91 0.70  CALCIUM 8.9 8.7*   GFR: Estimated Creatinine Clearance: 72.7 mL/min (by C-G formula based on SCr of 0.7 mg/dL). Liver Function Tests: Recent Labs  Lab 07/19/19 1820  AST 17  ALT 18  ALKPHOS 84  BILITOT 1.2  PROT 6.8  ALBUMIN 3.3*   No results for input(s): LIPASE, AMYLASE in the last 168 hours. No results for input(s): AMMONIA in the last 168 hours. Coagulation Profile: No results for input(s): INR, PROTIME in the last 168 hours. Cardiac Enzymes: No results for input(s): CKTOTAL, CKMB, CKMBINDEX, TROPONINI in the last 168 hours. BNP (last 3 results) No results for input(s): PROBNP in the last 8760 hours. HbA1C: No results for input(s): HGBA1C in the last 72 hours. CBG: Recent Labs  Lab 07/15/19 0658 07/15/19 1128 07/19/19 1757  GLUCAP 113* 108* 144*   Lipid Profile: No results for input(s): CHOL, HDL, LDLCALC, TRIG, CHOLHDL, LDLDIRECT in the last 72 hours. Thyroid Function Tests: No results for input(s): TSH, T4TOTAL, FREET4, T3FREE, THYROIDAB in the last 72 hours. Anemia Panel: No results for input(s): VITAMINB12, FOLATE, FERRITIN, TIBC, IRON, RETICCTPCT in the last 72 hours. Urine analysis:    Component Value Date/Time   COLORURINE YELLOW 12/31/2018 1028   APPEARANCEUR HAZY (A) 12/31/2018 1028   LABSPEC 1.020 12/31/2018 1028   PHURINE 6.0 12/31/2018 1028   GLUCOSEU NEGATIVE 12/31/2018 1028   HGBUR NEGATIVE 12/31/2018 1028   BILIRUBINUR negative 05/31/2019 1120   KETONESUR negative 05/31/2019 1120   KETONESUR NEGATIVE 12/31/2018 1028   PROTEINUR NEGATIVE 12/31/2018 1028   UROBILINOGEN 0.2  05/31/2019 1120   NITRITE Negative 05/31/2019 1120   NITRITE NEGATIVE 12/31/2018 1028   LEUKOCYTESUR Negative 05/31/2019 1120   LEUKOCYTESUR NEGATIVE 12/31/2018 1028   Sepsis Labs: @  LABRCNTIP(procalcitonin:4,lacticidven:4) ) Recent Results (from the past 240 hour(s))  SARS CORONAVIRUS 2 (TAT 6-24 HRS) Nasopharyngeal Nasopharyngeal Swab     Status: None   Collection Time: 07/11/19 11:07 AM   Specimen: Nasopharyngeal Swab  Result Value Ref Range Status   SARS Coronavirus 2 NEGATIVE NEGATIVE Final    Comment: (NOTE) SARS-CoV-2 target nucleic acids are NOT DETECTED. The SARS-CoV-2 RNA is generally detectable in upper and lower respiratory specimens during the acute phase of infection. Negative results do not preclude SARS-CoV-2 infection, do not rule out co-infections with other pathogens, and should not be used as the sole basis for treatment or other patient management decisions. Negative results must be combined with clinical observations, patient history, and epidemiological information. The expected result is Negative. Fact Sheet for Patients: HairSlick.nohttps://www.fda.gov/media/138098/download Fact Sheet for Healthcare Providers: quierodirigir.comhttps://www.fda.gov/media/138095/download This test is not yet approved or cleared by the [place] FDA and  has been authorized for detection and/or diagnosis of SARS-CoV-2 by FDA under an Emergency Use Authorization (EUA). This EUA will remain  in effect (meaning this test can be used) for the duration of the COVID-19 declaration under Section 56 4(b)(1) of the Act, 21 U.S.C. section 360bbb-3(b)(1), unless the authorization is terminated or revoked sooner. Performed at [facility], [address] [place], [place] [address]   Surgical pcr screen     Status: None   Collection Time: 07/11/19 11:51 AM   Specimen: Nasal Mucosa; Nasal Swab  Result Value Ref Range Status   MRSA, PCR NEGATIVE NEGATIVE Final   Staphylococcus aureus NEGATIVE NEGATIVE  Final    Comment: (NOTE) The Xpert SA Assay (FDA approved for NASAL specimens in patients 68 years of age and older), is one component of a comprehensive surveillance program. It is not intended to diagnose infection nor to guide or monitor treatment. Performed at [facility], [address] [place], [place] [address]      Radiological Exams on Admission: CT Head Wo Contrast  Result Date: 07/19/2019 CLINICAL DATA:  Altered mental status and confusion since knee surgery 2-3 days prior EXAM: CT HEAD WITHOUT CONTRAST TECHNIQUE: Contiguous axial images were obtained from the base of the skull through the vertex without intravenous contrast. COMPARISON:  CT November 03, 2016 FINDINGS: Brain: Prominent extra-axial CSF collection along the bilateral frontal convexities, possibly related to more diffuse changes of parenchymal volume loss or possible chronic subdural hygroma, overall unchanged from comparison study. No evidence of acute infarction, hemorrhage, hydrocephalus, extra-axial collection or mass lesion/mass effect. Patchy areas of white matter hypoattenuation are most compatible with chronic microvascular angiopathy. Vascular: This Atherosclerotic calcification of the carotid siphons. No hyperdense vessel. Skull: No calvarial fracture or suspicious osseous lesion. No scalp swelling or hematoma. Sinuses/Orbits: Paranasal sinuses and mastoid air cells are predominantly clear. Orbital structures are unremarkable aside from prior lens extractions. Other: Moderate right TMJ arthrosis. Anterior translation of the left occipital condyle at the time of exam. Possibly positional IMPRESSION: 1. No acute intracranial abnormality. 2. Prominent extra-axial CSF collection along the bilateral frontal convexities, possibly related to more diffuse changes of parenchymal volume loss or possible chronic subdural hygroma, overall unchanged from comparison study. 3. Chronic microvascular angiopathy. 4.  Moderate right TMJ arthrosis. Anterior translation of the left mandibular condyle, possibly positional though could correlate for symptoms of TMJ dysfunction. Electronically Signed   By: Kreg ShropshirePrice  DeHay M.D.   On: 07/19/2019 19:57    Assessment/Plan   1. Acute encephalopathy  - Presents with knee pain and confusion  since returning home on 4/20 after left TKA on 4/19  - Head CT negative for acute findings and no focal neurologic deficits identified on exam  - She is on several medications that could cause confusion but had been tolerating these previously and her presentation is more likely secondary to uncontrolled pain and/or dehydration  - Plan for pain-control, IVF hydration, check AMS labs, continue supportive care and monitoring    2. Hypokalemia  - Serum potasium is 3.1 in ED  - Replace, repeat chem panel in am   3. Depression  - Continue home medications (Remeron, Trintellix, Cymbalta)   4. Asthma  - Stable, continue ICS/LABA    5. Hypertension  - BP at goal, hold HCTZ while hydrating    6. Type II DM  - A1c was 5.6% in March 2021  - Check CBGs and use a low-intensity SSI for now     DVT prophylaxis: Lovenox  Code Status: Full  Family Communication: Husband updated at bedside Disposition Plan:  Patient is from: Home  Anticipated d/c is to: Home  Anticipated d/c date is: 07/20/19  Patient currently: Requiring ongoing workup for AMS  Consults called: None  Admission status: Observation     Briscoe Deutscher, MD [facility] Pager: See [email or web address]  If 7AM-7PM, please contact the daytime attending [email or web address]  07/19/2019, 9:40 PM

## 2019-07-19 NOTE — Telephone Encounter (Signed)
Hailey Greene called with c/o pain following her knee replacement with questions about when she is going to begin physical therapy.  She has medication on hand for the pain but it does not seem to be working well.  She was instructed to call the orthopedic office and speak to the nurse.

## 2019-07-19 NOTE — ED Provider Notes (Signed)
[facility]-EMERGENCY DEPT Provider Note   CSN: [id number] Arrival date & time: 07/19/19  1649     History No chief complaint on file.   Hailey Greene is a 69 y.o. female.  69 year old female brought in by husband for altered mental status.  Patient's husband states that she had a left knee replacement on Monday, no complications and was discharged home Tuesday.  Husband states since coming home on Tuesday, patient seems confused, is hallucinating, is not eating or drinking.  Patient asked to come to the hospital today, upon arrival kept saying "get me out of here."  Patient complains of feeling nauseous at this time, denies any pain, has difficulty answering other questions.  No other complaints or concerns.  Level 5 caveat applies due to altered mental status.        Past Medical History:  Diagnosis Date  . Acute hypoxemic respiratory failure due to COVID-19 (HCC)   . Chicken pox   . Depression   . Diabetes (HCC)   . GERD (gastroesophageal reflux disease)   . Hypertension   . Idiopathic progressive neuropathy   . Major depressive disorder   . Mixed hyperlipidemia   . Neuropathy   . Osteoarthritis   . Pneumonia   . Primary insomnia   . RLS (restless legs syndrome)   . Tachycardia   . Urge incontinence   . UTI (lower urinary tract infection)   . Weakness     Patient Active Problem List   Diagnosis Date Noted  . Acute encephalopathy 07/19/2019  . Hypokalemia 07/19/2019  . S/P total knee replacement 07/15/2019  . Diarrhea of presumed infectious origin 06/06/2019  . Class 2 obesity with alveolar hypoventilation, serious comorbidity, and body mass index (BMI) of 37.0 to 37.9 in adult (HCC) 06/02/2019  . Mild intermittent asthma without complication 06/02/2019  . Preoperative cardiovascular examination 05/31/2019  . Primary osteoarthritis of left knee 05/31/2019  . Diabetic polyneuropathy associated with type 2 diabetes mellitus (HCC) 05/31/2019   . Acute non-recurrent maxillary sinusitis 05/08/2019  . 2019 novel coronavirus disease (COVID-19) 12/31/2018  . Benign essential HTN 12/31/2018  . Chronic pain of left knee 12/11/2018  . Incisional hernia 11/03/2016  . Idiopathic progressive neuropathy 04/11/2014    Past Surgical History:  Procedure Laterality Date  . ABDOMINAL HYSTERECTOMY    . APPENDECTOMY    . CHOLECYSTECTOMY    . HERNIA REPAIR    . REPLACEMENT TOTAL KNEE Right   . TONSILLECTOMY    . TOTAL KNEE ARTHROPLASTY Left 07/15/2019   Procedure: TOTAL KNEE ARTHROPLASTY;  Surgeon: Dannielle Huh, MD;  Location: [facility];  Service: Orthopedics;  Laterality: Left;     OB History   No obstetric history on file.     Family History  Problem Relation Age of Onset  . Thyroid disease Mother   . Cancer Mother   . Migraines Mother   . Brain cancer Father   . Heart disease Maternal Grandmother   . Diabetes Daughter     Social History   Tobacco Use  . Smoking status: Former Smoker    Packs/day: 2.00    Years: 12.00    Pack years: 24.00    Types: Cigarettes    Quit date: 05/25/2010    Years since quitting: 9.1  . Smokeless tobacco: Never Used  Substance Use Topics  . Alcohol use: Yes    Comment: occasional  . Drug use: No    Home Medications Prior to Admission medications   Medication  Sig Start Date End Date Taking? Authorizing Provider  acetaminophen (TYLENOL) 500 MG tablet Take 500-1,000 mg by mouth every 6 (six) hours as needed (for pain).   Yes [provider]  amitriptyline (ELAVIL) 50 MG tablet Take 50 mg by mouth at bedtime. 07/08/19  Yes [provider]  aspirin EC 325 MG EC tablet Take 1 tablet (325 mg total) by mouth 2 (two) times daily. 07/16/19  Yes Guy Sandifer, PA  atorvastatin (LIPITOR) 20 MG tablet TAKE ONE (1) TABLET ONCE DAILY Patient taking differently: Take 20 mg by mouth daily.  06/26/19  Yes Marianne Sofia, PA-C  BELSOMRA 10 MG TABS Take 10 mg by mouth at bedtime as needed  (sleep.).  01/11/19  Yes [provider]  cyclobenzaprine (FLEXERIL) 10 MG tablet TAKE 1 TABLET 3 TIMES A DAY AS NEEDED Patient taking differently: Take 10 mg by mouth at bedtime.  06/27/19  Yes Marianne Sofia, PA-C  dicyclomine (BENTYL) 20 MG tablet TAKE ONE TABLET EVERY 6 HOURS AS NEEDED Patient taking differently: Take 10 mg by mouth every 6 (six) hours as needed (stomach problems/abdominal spasms.).  06/27/19  Yes Marianne Sofia, PA-C  DULoxetine (CYMBALTA) 60 MG capsule Take 60 mg by mouth 2 (two) times daily.  06/27/19  Yes [provider]  hydrochlorothiazide (HYDRODIURIL) 25 MG tablet Take 25 mg by mouth daily.   Yes [provider]  metFORMIN (GLUCOPHAGE) 500 MG tablet Take 500 mg by mouth daily with breakfast.   Yes [provider]  mirtazapine (REMERON) 15 MG tablet Take 15 mg by mouth at bedtime. 04/23/19  Yes [provider]  Multiple Vitamin (MULTIVITAMIN WITH MINERALS) TABS tablet Take 1 tablet by mouth daily.   Yes [provider]  omeprazole (PRILOSEC) 20 MG capsule TAKE ONE CAPSULE BY MOUTH DAILY BEFORE AMEAL Patient taking differently: Take 20 mg by mouth daily.  06/11/19  Yes Marianne Sofia, PA-C  ondansetron (ZOFRAN) 4 MG tablet TAKE ONE TABLET BY MOUTH EVERY EIGHT HOURS AS NEEDED NAUSEA Patient taking differently: Take 4 mg by mouth every 8 (eight) hours as needed for nausea.  06/06/19  Yes Cox, Kirsten, MD  oxyCODONE (OXY IR/ROXICODONE) 5 MG immediate release tablet Take 1-2 tablets (5-10 mg total) by mouth every 6 (six) hours as needed for moderate pain (pain score 4-6). 07/16/19  Yes Guy Sandifer, PA  pregabalin (LYRICA) 200 MG capsule TAKE ONE (1) CAPSULE THREE (3) TIMES EACH DAY Patient taking differently: Take 200 mg by mouth in the morning, at noon, and at bedtime.  06/27/19  Yes Marianne Sofia, PA-C  rOPINIRole (REQUIP) 3 MG tablet Take 3 mg by mouth at bedtime. 06/27/19  Yes [provider]  solifenacin (VESICARE) 5 MG  tablet TAKE ONE (1) TABLET ONCE DAILY Patient taking differently: Take 5 mg by mouth daily.  06/27/19  Yes Marianne Sofia, PA-C  SYMBICORT 160-4.5 MCG/ACT inhaler Inhale 2 puffs into the lungs 2 times daily at 12 noon and 4 pm. 05/31/19  Yes Cox, Kirsten, MD  traMADol (ULTRAM) 50 MG tablet Take 50-100 mg by mouth every 6 (six) hours as needed (pain).   Yes [provider]  TRINTELLIX 10 MG TABS tablet TAKE 1 TABLET BY MOUTH ONCE DAILY AT [facility] TIME [facility] DAY Patient taking differently: Take 10 mg by mouth daily.  07/08/19  Yes Cox, Kirsten, MD  VICTOZA 18 MG/3ML SOPN INJECT 1.2 MG ONCE DAILY Patient taking differently: Inject 1.6 mg into the skin daily.  06/13/19  Yes Cox, Kirsten,  MD  glucose blood (ONETOUCH VERIO) test strip  09/11/18   [provider]  Lancets [facility] [place] PLUS [place]) MISC  09/11/18   [provider]    Allergies    Patient has no known allergies.  Review of Systems   Review of Systems  Unable to perform ROS: Mental status change  Constitutional: Negative for fever.  Respiratory: Negative for shortness of breath.   Cardiovascular: Negative for chest pain.  Gastrointestinal: Positive for nausea. Negative for abdominal pain and vomiting.  Psychiatric/Behavioral: Positive for confusion and hallucinations.    Physical Exam Updated Vital Signs BP (!) 154/78   Pulse 92   Temp 97.7 F (36.5 C) (Oral)   Resp 16   Ht 5\' 3"  (1.6 m)   Wt 92.5 kg   SpO2 95%   BMI 36.12 kg/m   Physical Exam Vitals and nursing note reviewed.  Constitutional:      General: She is not in acute distress.    Appearance: She is well-developed. She is not diaphoretic.  HENT:     Head: Normocephalic and atraumatic.     Mouth/Throat:     Mouth: Mucous membranes are dry.  Eyes:     Extraocular Movements: Extraocular movements intact.     Pupils: Pupils are equal, round, and reactive to light.  Cardiovascular:     Rate and Rhythm: Normal rate and regular rhythm.      Pulses: Normal pulses.     Heart sounds: Normal heart sounds.  Pulmonary:     Effort: Pulmonary effort is normal.     Breath sounds: Normal breath sounds.  Abdominal:     Palpations: Abdomen is soft.     Tenderness: There is no abdominal tenderness.  Musculoskeletal:     Right lower leg: No edema.     Left lower leg: No edema.     Comments: Surgical site with dressing in place, no surrounding erythema, no significant leg/calf swelling  Skin:    General: Skin is warm and dry.     Findings: No erythema or rash.  Neurological:     Mental Status: She is alert.     Cranial Nerves: No cranial nerve deficit.     Sensory: No sensory deficit.     Motor: No weakness.     Coordination: Finger-Nose-Finger Test normal.     Comments: Alert to person, place, not alert to dates, events. Follows commands.  Psychiatric:        Behavior: Behavior normal.     ED Results / Procedures / Treatments   Labs (all labs ordered are listed, but only abnormal results are displayed) Labs Reviewed  COMPREHENSIVE METABOLIC PANEL - Abnormal; Notable for the following components:      Result Value   Potassium 3.1 (*)    Glucose, Bld 143 (*)    Calcium 8.7 (*)    Albumin 3.3 (*)    All other components within normal limits  CBC - Abnormal; Notable for the following components:   WBC 12.2 (*)    All other components within normal limits  CBG MONITORING, ED - Abnormal; Notable for the following components:   Glucose-Capillary 144 (*)    All other components within normal limits  RESPIRATORY PANEL BY RT PCR (FLU A&B, COVID)  URINALYSIS, ROUTINE W REFLEX MICROSCOPIC    EKG None  Radiology CT Head Wo Contrast  Result Date: 07/19/2019 CLINICAL DATA:  Altered mental status and confusion since knee surgery 2-3 days prior EXAM: CT HEAD WITHOUT CONTRAST  TECHNIQUE: Contiguous axial images were obtained from the base of the skull through the vertex without intravenous contrast. COMPARISON:  CT November 03, 2016 FINDINGS: Brain: Prominent extra-axial CSF collection along the bilateral frontal convexities, possibly related to more diffuse changes of parenchymal volume loss or possible chronic subdural hygroma, overall unchanged from comparison study. No evidence of acute infarction, hemorrhage, hydrocephalus, extra-axial collection or mass lesion/mass effect. Patchy areas of white matter hypoattenuation are most compatible with chronic microvascular angiopathy. Vascular: This Atherosclerotic calcification of the carotid siphons. No hyperdense vessel. Skull: No calvarial fracture or suspicious osseous lesion. No scalp swelling or hematoma. Sinuses/Orbits: Paranasal sinuses and mastoid air cells are predominantly clear. Orbital structures are unremarkable aside from prior lens extractions. Other: Moderate right TMJ arthrosis. Anterior translation of the left occipital condyle at the time of exam. Possibly positional IMPRESSION: 1. No acute intracranial abnormality. 2. Prominent extra-axial CSF collection along the bilateral frontal convexities, possibly related to more diffuse changes of parenchymal volume loss or possible chronic subdural hygroma, overall unchanged from comparison study. 3. Chronic microvascular angiopathy. 4. Moderate right TMJ arthrosis. Anterior translation of the left mandibular condyle, possibly positional though could correlate for symptoms of TMJ dysfunction. Electronically Signed   By: Lovena Le M.D.   On: 07/19/2019 19:57    Procedures Procedures (including critical care time)  Medications Ordered in ED Medications  sodium chloride 0.9 % bolus 1,000 mL (1,000 mLs Intravenous New Bag/Given (Non-Interop) 07/19/19 1830)  ondansetron (ZOFRAN) injection 4 mg (4 mg Intravenous Given 07/19/19 1831)    ED Course  I have reviewed the triage vital signs and the nursing notes.  Pertinent labs & imaging results that were available during my care of the patient were reviewed by me and  considered in my medical decision making (see chart for details).  Clinical Course as of Jul 19 2038  Fri Jul 18, 8877  7675 69 year old female brought in by husband for altered mental status since returning home from surgery on Tuesday.  Patient is postop from left knee replacement.  Has been states the patient is not eating or drinking, is confused with repetitive questioning and hallucinating at times.  Patient is alert to person and place, does not know date or events such as president.  Patient does have repetitive questioning such as I want to go there, just get me there.  CBC with mild leukocytosis with white count of 12,000, CMP with mild hypokalemia with potassium of 3.1.  CT head without acute findings to explain patient's condition.  Urinalysis pending, patient has had 1 L of normal saline and has not voided. Discussed results with Dr. Tamera Punt, ER attending who has seen the patient, plan is to admit for altered mental status.  Discussed results and plan of care with patient and her husband who verbalized understanding.  Case discussed with Dr. Myna Hidalgo, on-call with hospitalist service who will consult for admission.   [LM]    Clinical Course User Index [LM] Roque Lias   MDM Rules/Calculators/A&P                      Final Clinical Impression(s) / ED Diagnoses Final diagnoses:  Altered mental status, unspecified altered mental status type    Rx / DC Orders ED Discharge Orders    None       Roque Lias 07/19/19 2040    Malvin Johns, MD 07/19/19 2352

## 2019-07-19 NOTE — Progress Notes (Signed)
Patient arrived to unit at 2134, oriented to person and place but not date and situation, however later was unable to recall her last name. VSS, lungs clear, SR on the monitor, skin intact other than surgical incision which is covered by a silver hydrofiber dressing, +pp, +csm, neg homans, pt had a small BM during transfer from ED, new purewick placed, educated patient on call bell, bed in low position with alarm on, pt having some nausea and dry heaving, zofran given but was ineffective, on-call provider text paged for alternative, awaiting orders, will continue to monitor.

## 2019-07-19 NOTE — ED Notes (Signed)
Pure wick has been placed. Suction set to 45mmHg.  

## 2019-07-19 NOTE — ED Notes (Signed)
ED TO INPATIENT HANDOFF REPORT  Name/Age/Gender Hailey Greene 69 y.o. female  Code Status Code Status History    Date Active Date Inactive Code Status Order ID Comments User Context   07/15/2019 1240 07/16/2019 2223 Full Code [id number]  Guy Sandifer, [place] Inpatient   12/31/2018 1725 01/04/2019 1925 Full Code [id number]  Standley Brooking, MD Inpatient   Advance Care Planning Activity      Home/SNF/Other Home  Chief Complaint Acute encephalopathy [G93.40]  Level of Care/Admitting Diagnosis ED Disposition    ED Disposition Condition Comment   Admit  Hospital Area: [facility] [[id number]]  Level of Care: Telemetry [5]  Admit to tele based on following criteria: Other see comments  Comments: acute neurologic condition  Covid Evaluation: Asymptomatic Screening Protocol (No Symptoms)  Diagnosis: Acute encephalopathy [389373]  Admitting Physician: Briscoe Deutscher [[id number]]  Attending Physician: Briscoe Deutscher [[id number]]       Medical History Past Medical History:  Diagnosis Date  . Acute hypoxemic respiratory failure due to COVID-19 (HCC)   . Chicken pox   . Depression   . Diabetes (HCC)   . GERD (gastroesophageal reflux disease)   . Hypertension   . Idiopathic progressive neuropathy   . Major depressive disorder   . Mixed hyperlipidemia   . Neuropathy   . Osteoarthritis   . Pneumonia   . Primary insomnia   . RLS (restless legs syndrome)   . Tachycardia   . Urge incontinence   . UTI (lower urinary tract infection)   . Weakness     Allergies No Known Allergies  IV Location/Drains/Wounds Patient Lines/Drains/Airways Status   Active Line/Drains/Airways    Name:   Placement date:   Placement time:   Site:   Days:   Peripheral IV 07/19/19 Right Forearm   07/19/19    1825    Forearm   less than 1   Incision (Closed) 07/15/19 Knee Left   07/15/19    1033     4          Labs/Imaging Results for orders placed or performed during the  hospital encounter of 07/19/19 (from the past 48 hour(s))  CBG monitoring, ED     Status: Abnormal   Collection Time: 07/19/19  5:57 PM  Result Value Ref Range   Glucose-Capillary 144 (H) 70 - 99 mg/dL    Comment: Glucose reference range applies only to samples taken after fasting for at least 8 hours.  Comprehensive metabolic panel     Status: Abnormal   Collection Time: 07/19/19  6:20 PM  Result Value Ref Range   Sodium 137 135 - 145 mmol/L   Potassium 3.1 (L) 3.5 - 5.1 mmol/L   Chloride 102 98 - 111 mmol/L   CO2 23 22 - 32 mmol/L   Glucose, Bld 143 (H) 70 - 99 mg/dL    Comment: Glucose reference range applies only to samples taken after fasting for at least 8 hours.   BUN 20 8 - 23 mg/dL   Creatinine, Ser 3.55 0.44 - 1.00 mg/dL   Calcium 8.7 (L) 8.9 - 10.3 mg/dL   Total Protein 6.8 6.5 - 8.1 g/dL   Albumin 3.3 (L) 3.5 - 5.0 g/dL   AST 17 15 - 41 U/L   ALT 18 0 - 44 U/L   Alkaline Phosphatase 84 38 - 126 U/L   Total Bilirubin 1.2 0.3 - 1.2 mg/dL   GFR calc non Af Amer >60 >60 mL/min   GFR  calc Af Amer >60 >60 mL/min   Anion gap 12 5 - 15    Comment: Performed at [facility], [address] [place], [place] [address]  CBC     Status: Abnormal   Collection Time: 07/19/19  6:20 PM  Result Value Ref Range   WBC 12.2 (H) 4.0 - 10.5 K/uL   RBC 4.31 3.87 - 5.11 MIL/uL   Hemoglobin 12.5 12.0 - 15.0 g/dL   HCT 16.0 10.9 - 32.3 %   MCV 88.9 80.0 - 100.0 fL   MCH 29.0 26.0 - 34.0 pg   MCHC 32.6 30.0 - 36.0 g/dL   RDW 55.7 32.2 - 02.5 %   Platelets 307 150 - 400 K/uL   nRBC 0.0 0.0 - 0.2 %    Comment: Performed at [facility], [address] [place], [place] [address]   CT Head Wo Contrast  Result Date: 07/19/2019 CLINICAL DATA:  Altered mental status and confusion since knee surgery 2-3 days prior EXAM: CT HEAD WITHOUT CONTRAST TECHNIQUE: Contiguous axial images were obtained from the base of the skull through the vertex without  intravenous contrast. COMPARISON:  CT November 03, 2016 FINDINGS: Brain: Prominent extra-axial CSF collection along the bilateral frontal convexities, possibly related to more diffuse changes of parenchymal volume loss or possible chronic subdural hygroma, overall unchanged from comparison study. No evidence of acute infarction, hemorrhage, hydrocephalus, extra-axial collection or mass lesion/mass effect. Patchy areas of white matter hypoattenuation are most compatible with chronic microvascular angiopathy. Vascular: This Atherosclerotic calcification of the carotid siphons. No hyperdense vessel. Skull: No calvarial fracture or suspicious osseous lesion. No scalp swelling or hematoma. Sinuses/Orbits: Paranasal sinuses and mastoid air cells are predominantly clear. Orbital structures are unremarkable aside from prior lens extractions. Other: Moderate right TMJ arthrosis. Anterior translation of the left occipital condyle at the time of exam. Possibly positional IMPRESSION: 1. No acute intracranial abnormality. 2. Prominent extra-axial CSF collection along the bilateral frontal convexities, possibly related to more diffuse changes of parenchymal volume loss or possible chronic subdural hygroma, overall unchanged from comparison study. 3. Chronic microvascular angiopathy. 4. Moderate right TMJ arthrosis. Anterior translation of the left mandibular condyle, possibly positional though could correlate for symptoms of TMJ dysfunction. Electronically Signed   By: Kreg Shropshire M.D.   On: 07/19/2019 19:57    Pending Labs Unresulted Labs (From admission, onward)    Start     Ordered   07/19/19 2046  HIV Antibody (routine testing w rflx)  (HIV Antibody (Routine testing w reflex) panel)  Once,   STAT     07/19/19 2045   07/19/19 2045  Magnesium  Once,   STAT     07/19/19 2045   07/19/19 2045  TSH  Once,   STAT     07/19/19 2045   07/19/19 2045  Ammonia  Once,   STAT     07/19/19 2045   07/19/19 2045  Vitamin B12   Once,   STAT     07/19/19 2045   07/19/19 2045  RPR  Once,   STAT     07/19/19 2045   07/19/19 2023  Respiratory Panel by RT PCR (Flu A&B, Covid) - Nasopharyngeal Swab  (Tier 2 Respiratory Panel by RT PCR (Flu A&B, Covid) (TAT 2 hrs))  Once,   STAT    Question Answer Comment  Is this test for diagnosis or screening Screening   Symptomatic for COVID-19 as defined by CDC No   Hospitalized for COVID-19  No   Admitted to ICU for COVID-19 No   Previously tested for COVID-19 Yes   Resident in a congregate (group) care setting No   Employed in healthcare setting No   Pregnant No   Has patient completed COVID vaccination(s) (2 doses of Pfizer/Moderna 1 dose of [place]) Unknown      07/19/19 2022   07/19/19 1804  Urinalysis, Routine w reflex microscopic  ONCE - STAT,   STAT     07/19/19 1804          Vitals/Pain Today's Vitals   07/19/19 1750 07/19/19 1800 07/19/19 1815 07/19/19 2000  BP: 138/67 125/71 132/63 (!) 154/78  Pulse: 93 91 91 92  Resp: 16   16  Temp:      TempSrc:      SpO2: 99% 97% 96% 95%  Weight:      Height:      PainSc:        Isolation Precautions No active isolations  Medications Medications  potassium chloride SA (KLOR-CON) CR tablet 40 mEq (has no administration in time range)  acetaminophen (TYLENOL) tablet 650 mg (has no administration in time range)  HYDROcodone-acetaminophen (NORCO/VICODIN) 5-325 MG per tablet 1-2 tablet (has no administration in time range)  morphine 4 MG/ML injection 4 mg (has no administration in time range)  ondansetron (ZOFRAN) injection 4 mg (has no administration in time range)  sodium chloride 0.9 % bolus 1,000 mL (0 mLs Intravenous Stopped 07/19/19 2110)  ondansetron (ZOFRAN) injection 4 mg (4 mg Intravenous Given 07/19/19 1831)    Mobility non-ambulatory

## 2019-07-20 DIAGNOSIS — Z8249 Family history of ischemic heart disease and other diseases of the circulatory system: Secondary | ICD-10-CM | POA: Diagnosis not present

## 2019-07-20 DIAGNOSIS — G9340 Encephalopathy, unspecified: Secondary | ICD-10-CM | POA: Diagnosis present

## 2019-07-20 DIAGNOSIS — F5101 Primary insomnia: Secondary | ICD-10-CM | POA: Diagnosis present

## 2019-07-20 DIAGNOSIS — K219 Gastro-esophageal reflux disease without esophagitis: Secondary | ICD-10-CM | POA: Diagnosis present

## 2019-07-20 DIAGNOSIS — Z808 Family history of malignant neoplasm of other organs or systems: Secondary | ICD-10-CM | POA: Diagnosis not present

## 2019-07-20 DIAGNOSIS — R112 Nausea with vomiting, unspecified: Secondary | ICD-10-CM | POA: Diagnosis present

## 2019-07-20 DIAGNOSIS — E876 Hypokalemia: Secondary | ICD-10-CM | POA: Diagnosis present

## 2019-07-20 DIAGNOSIS — E669 Obesity, unspecified: Secondary | ICD-10-CM | POA: Diagnosis present

## 2019-07-20 DIAGNOSIS — E782 Mixed hyperlipidemia: Secondary | ICD-10-CM | POA: Diagnosis present

## 2019-07-20 DIAGNOSIS — Z96653 Presence of artificial knee joint, bilateral: Secondary | ICD-10-CM | POA: Diagnosis present

## 2019-07-20 DIAGNOSIS — E860 Dehydration: Secondary | ICD-10-CM | POA: Diagnosis present

## 2019-07-20 DIAGNOSIS — I10 Essential (primary) hypertension: Secondary | ICD-10-CM | POA: Diagnosis present

## 2019-07-20 DIAGNOSIS — E1142 Type 2 diabetes mellitus with diabetic polyneuropathy: Secondary | ICD-10-CM

## 2019-07-20 DIAGNOSIS — G2581 Restless legs syndrome: Secondary | ICD-10-CM | POA: Diagnosis present

## 2019-07-20 DIAGNOSIS — F419 Anxiety disorder, unspecified: Secondary | ICD-10-CM | POA: Diagnosis present

## 2019-07-20 DIAGNOSIS — M25562 Pain in left knee: Secondary | ICD-10-CM

## 2019-07-20 DIAGNOSIS — F329 Major depressive disorder, single episode, unspecified: Secondary | ICD-10-CM | POA: Diagnosis present

## 2019-07-20 DIAGNOSIS — G8929 Other chronic pain: Secondary | ICD-10-CM

## 2019-07-20 DIAGNOSIS — Z6838 Body mass index (BMI) 38.0-38.9, adult: Secondary | ICD-10-CM | POA: Diagnosis not present

## 2019-07-20 DIAGNOSIS — J4520 Mild intermittent asthma, uncomplicated: Secondary | ICD-10-CM | POA: Diagnosis present

## 2019-07-20 DIAGNOSIS — Z20822 Contact with and (suspected) exposure to covid-19: Secondary | ICD-10-CM | POA: Diagnosis present

## 2019-07-20 DIAGNOSIS — M1990 Unspecified osteoarthritis, unspecified site: Secondary | ICD-10-CM | POA: Diagnosis present

## 2019-07-20 DIAGNOSIS — Z90710 Acquired absence of both cervix and uterus: Secondary | ICD-10-CM | POA: Diagnosis not present

## 2019-07-20 DIAGNOSIS — D649 Anemia, unspecified: Secondary | ICD-10-CM | POA: Diagnosis present

## 2019-07-20 LAB — URINALYSIS, ROUTINE W REFLEX MICROSCOPIC
Bilirubin Urine: NEGATIVE
Glucose, UA: NEGATIVE mg/dL
Hgb urine dipstick: NEGATIVE
Ketones, ur: 20 mg/dL — AB
Leukocytes,Ua: NEGATIVE
Nitrite: NEGATIVE
Protein, ur: NEGATIVE mg/dL
Specific Gravity, Urine: 1.02 (ref 1.005–1.030)
pH: 6 (ref 5.0–8.0)

## 2019-07-20 LAB — BASIC METABOLIC PANEL
Anion gap: 7 (ref 5–15)
BUN: 16 mg/dL (ref 8–23)
CO2: 23 mmol/L (ref 22–32)
Calcium: 7.8 mg/dL — ABNORMAL LOW (ref 8.9–10.3)
Chloride: 105 mmol/L (ref 98–111)
Creatinine, Ser: 0.68 mg/dL (ref 0.44–1.00)
GFR calc Af Amer: >60 mL/min (ref >60)
GFR calc non Af Amer: >60 mL/min (ref >60)
Glucose, Bld: 122 mg/dL — ABNORMAL HIGH (ref 70–99)
Potassium: 3.5 mmol/L (ref 3.5–5.1)
Sodium: 135 mmol/L (ref 135–145)

## 2019-07-20 LAB — GLUCOSE, CAPILLARY
Glucose-Capillary: 129 mg/dL — ABNORMAL HIGH (ref 70–99)
Glucose-Capillary: 116 mg/dL — ABNORMAL HIGH (ref 70–99)
Glucose-Capillary: 103 mg/dL — ABNORMAL HIGH (ref 70–99)
Glucose-Capillary: 94 mg/dL (ref 70–99)

## 2019-07-20 LAB — MAGNESIUM: Magnesium: 2.1 mg/dL (ref 1.7–2.4)

## 2019-07-20 LAB — CBC
HCT: 34.1 % — ABNORMAL LOW (ref 36.0–46.0)
Hemoglobin: 10.7 g/dL — ABNORMAL LOW (ref 12.0–15.0)
MCH: 28.8 pg (ref 26.0–34.0)
MCHC: 31.4 g/dL (ref 30.0–36.0)
MCV: 91.7 fL (ref 80.0–100.0)
Platelets: 278 10*3/uL (ref 150–400)
RBC: 3.72 MIL/uL — ABNORMAL LOW (ref 3.87–5.11)
RDW: 14.6 % (ref 11.5–15.5)
WBC: 9.3 10*3/uL (ref 4.0–10.5)
nRBC: 0 % (ref 0.0–0.2)

## 2019-07-20 LAB — RPR: RPR Ser Ql: NONREACTIVE

## 2019-07-20 LAB — PHOSPHORUS: Phosphorus: 3.4 mg/dL (ref 2.5–4.6)

## 2019-07-20 LAB — HIV ANTIBODY (ROUTINE TESTING W REFLEX): HIV Screen 4th Generation wRfx: NONREACTIVE

## 2019-07-20 MED ORDER — TRAMADOL HCL 50 MG PO TABS
50.0000 mg | ORAL_TABLET | Freq: Four times a day (QID) | ORAL | Status: DC | PRN
  Administered 2019-07-20: 50 mg via ORAL
  Filled 2019-07-20: qty 1

## 2019-07-20 MED ORDER — SODIUM CHLORIDE 0.9 % IV SOLN
INTRAVENOUS | Status: DC | PRN
  Administered 2019-07-20: 1000 mL via INTRAVENOUS

## 2019-07-20 MED ORDER — POTASSIUM CHLORIDE CRYS ER 20 MEQ PO TBCR
40.0000 meq | EXTENDED_RELEASE_TABLET | Freq: Once | ORAL | Status: DC
  Filled 2019-07-20: qty 2

## 2019-07-20 MED ORDER — POTASSIUM CHLORIDE 10 MEQ/100ML IV SOLN
INTRAVENOUS | Status: AC
  Filled 2019-07-20: qty 100

## 2019-07-20 MED ORDER — VITAMIN B-12 1000 MCG PO TABS
1000.0000 ug | ORAL_TABLET | Freq: Every day | ORAL | Status: DC
  Administered 2019-07-20 – 2019-07-21 (×2): 1000 ug via ORAL
  Filled 2019-07-20 (×2): qty 1

## 2019-07-20 MED ORDER — SODIUM CHLORIDE 0.9 % IV SOLN: INTRAVENOUS | Status: AC

## 2019-07-20 MED ORDER — POTASSIUM CHLORIDE 10 MEQ/100ML IV SOLN
10.0000 meq | INTRAVENOUS | Status: AC
  Administered 2019-07-20 (×4): 10 meq via INTRAVENOUS
  Filled 2019-07-20 (×3): qty 100

## 2019-07-20 MED ORDER — KETOROLAC TROMETHAMINE 30 MG/ML IJ SOLN: 30.0000 mg | Freq: Three times a day (TID) | INTRAMUSCULAR | Status: DC | PRN

## 2019-07-20 NOTE — Evaluation (Signed)
Physical Therapy Evaluation Patient Details Name: Hailey Greene MRN: [id number] DOB: [birth year] Today's Date: 07/20/2019   History of Present Illness  Patient is 68 y.o. female s/p Lt TKA on 07/15/19 with PMH significant for OA, neuropathy, HLS, depression, HTN, DM,  COVID-19 12/2018. Admitted 07/19/19 with AMS, hallucinating, not eating and drinking. CT head normal.  Clinical Impression  The patient presents with confusion, not fully oriented. Patient becomes very anxious while perform TKA exercises, able to perform ~ 5 reps then has increased respirations and states":I can't do any more" patient  Assisted to [facility] with mod assistance for safety, somewhat impulsive when had urgency. Patient is quite different than at DC last week post  LTKA.. Pt admitted with above diagnosis.  Pt currently with functional limitations due to the deficits listed below (see PT Problem List). Pt will benefit from skilled PT to increase their independence and safety with mobility to allow discharge to the venue listed below.       Follow Up Recommendations Follow surgeon's recommendation for DC plan and follow-up therapies;Home health PT    Equipment Recommendations  None recommended by PT    Recommendations for Other Services       Precautions / Restrictions Precautions Precautions: Fall Precaution Comments: reviewed no pillow under knee Restrictions Weight Bearing Restrictions: No Other Position/Activity Restrictions: WBAT      Mobility  Bed Mobility   Bed Mobility: Supine to Sit;Sit to Supine     Supine to sit: Supervision Sit to supine: Min assist   General bed mobility comments: supervision to sitting for safety as patient was in urgent need for BSC, assist with LLE back onto bed  Transfers Overall transfer level: Needs assistance Equipment used: 1 person hand held assist Transfers: Sit to/from [employer] Transfers Sit to Stand: Mod assist Stand pivot transfers: Mod assist        General transfer comment: Cues for safety and assist with lines, Pt. has purewick and IV and was tangled up.  Pivot to [facility] then back to bed.  Ambulation/Gait                Stairs            Wheelchair Mobility    Modified Rankin (Stroke Patients Only)       Balance Overall balance assessment: Needs assistance Sitting-balance support: Feet supported Sitting balance-Leahy Scale: Good     Standing balance support: During functional activity;Bilateral upper extremity supported Standing balance-Leahy Scale: Poor Standing balance comment: requires steady assist                             Pertinent Vitals/Pain Pain Assessment: Faces Faces Pain Scale: Hurts a little bit Pain Location: Lt TKA Pain Descriptors / Indicators: Discomfort Pain Intervention(s): Monitored during session    Home Living Family/patient expects to be discharged to:: Private residence Living Arrangements: Spouse/significant other Available Help at Discharge: Family Type of Home: House Home Access: Stairs to enter Entrance Stairs-Rails: Can reach both Entrance Stairs-Number of Steps: 3 in back and 6 at front Home Layout: One level Home Equipment: Walker - 2 wheels;Walker - 4 wheels;Cane - single point;Grab bars - toilet;Grab bars - tub/shower;Bedside commode;Electric scooter Additional Comments: pt recently moved in November to a new home    Prior Function Level of Independence: Needs assistance   Gait / Transfers Assistance Needed: using RW for gait since TKA  ADL's / Homemaking Assistance Needed: Reports husband assists as  needed for bathing (uses tub bench and HH shower head with husband's assist) and toileting. Has been using Depends/diapers "since getting sick." Husband does 49 of household tasks. Difficulty reaching feet, wears slip-on shoes, (info from previous admission)        Hand Dominance        Extremity/Trunk Assessment   Upper Extremity  Assessment Upper Extremity Assessment: Defer to OT evaluation    Lower Extremity Assessment Lower Extremity Assessment: LLE deficits/detail LLE Deficits / Details: has quad with Knee press, requires assist for SLR, Knee flexion only to about 3o* supine and 60* sitting LLE Sensation: WNL       Communication      Cognition Arousal/Alertness: Awake/alert Behavior During Therapy: Impulsive Overall Cognitive Status: Impaired/Different from baseline Area of Impairment: Orientation;Safety/judgement;Awareness                 Orientation Level: Time;Situation       Safety/Judgement: Decreased awareness of safety Awareness: Emergent   General Comments: O to "[place]", that she had a TKA last week, not O to date: wednesday, March. Patient  scrambled to sitting with urgency for BM      General Comments      Exercises Total Joint Exercises Ankle Circles/Pumps: AROM;Both;20 reps;Seated Quad Sets: AROM;Left;10 reps;Supine Heel Slides: Left;AAROM;10 reps;Supine Hip ABduction/ADduction: AROM;Left;10 reps;Supine Straight Leg Raises: AAROM;10 reps;Supine;Left   Assessment/Plan    PT Assessment Patient needs continued PT services  PT Problem List Decreased strength;Decreased range of motion;Decreased activity tolerance;Decreased balance;Decreased mobility;Decreased knowledge of use of DME;Decreased knowledge of precautions       PT Treatment Interventions DME instruction;Gait training;Stair training;Functional mobility training;Therapeutic activities;Therapeutic exercise;Balance training;Patient/family education    PT Goals (Current goals can be found in the Care Plan section)  Acute Rehab PT Goals Patient Stated Goal: agreed to therapy PT Goal Formulation: Patient unable to participate in goal setting Time For Goal Achievement: 08/09/19 Potential to Achieve Goals: Good    Frequency Min 5X/week   Barriers to discharge        Co-evaluation                AM-PAC PT "6 Clicks" Mobility  Outcome Measure Help needed turning from your back to your side while in a flat bed without using bedrails?: A Little Help needed moving from lying on your back to sitting on the side of a flat bed without using bedrails?: A Little Help needed moving to and from a bed to a chair (including a wheelchair)?: A Lot Help needed standing up from a chair using your arms (e.g., wheelchair or bedside chair)?: A Lot Help needed to walk in hospital room?: A Lot Help needed climbing 3-5 steps with a railing? : A Lot 6 Click Score: 14    End of Session   Activity Tolerance: Patient limited by fatigue Patient left: in chair;with call bell/phone within reach;with bed alarm set Nurse Communication: Mobility status PT Visit Diagnosis: Muscle weakness (generalized) (M62.81);Difficulty in walking, not elsewhere classified (R26.2)    Time: 1610-9604 PT Time Calculation (min) (ACUTE ONLY): 27 min   Charges:   PT Evaluation $PT Eval Low Complexity: 1 Low PT Treatments $Therapeutic Activity: 8-22 mins        Tresa Endo PT Acute Rehabilitation Services Pager [phone] Office [phone]   Claretha Cooper 07/20/2019, 12:34 PM

## 2019-07-20 NOTE — Evaluation (Signed)
Occupational Therapy Evaluation Patient Details Name: Hailey Greene MRN: [id number] DOB: March 07, 1951 Today's Date: 07/20/2019    History of Present Illness Patient is 69 y.o. female s/p Lt TKA on 07/15/19 with PMH significant for OA, neuropathy, HLS, depression, HTN, DM,  COVID-19 12/2018. Admitted 07/19/19 with AMS, hallucinating, not eating and drinking. CT head normal.   Clinical Impression   Limited session today due to pt's adamant refusal to demonstrate OOB mobility. Pt assisted to EOB with supervision and back to supine with min assist for L LE. Pt internally distracted and perseverating on Trump after being asked who is the current president. Pt was ambulating with RW and assistance of her husband and assisted with LB ADL and all IADL since her TKA. Will follow acutely. Pt is very eager to go home.     Follow Up Recommendations  Follow surgeon's recommendation for DC plan and follow-up therapies;Home health OT;Supervision/Assistance - 24 hour(depending on progress)    Equipment Recommendations  None recommended by OT    Recommendations for Other Services       Precautions / Restrictions Precautions Precautions: Fall Precaution Comments: reviewed no pillow under knee Restrictions Weight Bearing Restrictions: No Other Position/Activity Restrictions: WBAT      Mobility Bed Mobility Overal bed mobility: Needs Assistance Bed Mobility: Supine to Sit;Sit to Supine     Supine to sit: Supervision Sit to supine: Min assist   General bed mobility comments: min assist for L LE back into bed, decreased awareness of safety or IV line  Transfers Overall transfer level: Needs assistance Equipment used: 1 person hand held assist Transfers: Sit to/from [employer] Sit to Stand: Mod assist Stand pivot transfers: Mod assist       General transfer comment: pt adamantly refused     Balance Overall balance assessment: Needs assistance Sitting-balance support:  Feet supported Sitting balance-Leahy Scale: Good     Standing balance support: During functional activity;Bilateral upper extremity supported Standing balance-Leahy Scale: Poor Standing balance comment: requires steady assist                           ADL either performed or assessed with clinical judgement   ADL Overall ADL's : Needs assistance/impaired Eating/Feeding: Independent;Bed level   Grooming: Wash/dry hands;Wash/dry face;Sitting;Supervision/safety;Set up   Upper Body Bathing: Minimal assistance;Sitting   Lower Body Bathing: Moderate assistance;Sit to/from stand   Upper Body Dressing : Minimal assistance;Sitting   Lower Body Dressing: Moderate assistance;Sit to/from stand                 General ADL Comments: Assisted pt to EOB and she impulsively returned to supine when asked to get to chair or give urine sample on BSC.     Vision Patient Visual Report: No change from baseline       Perception     Praxis      Pertinent Vitals/Pain Pain Assessment: Faces Faces Pain Scale: Hurts a little bit Pain Location: L knee Pain Descriptors / Indicators: Discomfort Pain Intervention(s): Repositioned     Hand Dominance Right   Extremity/Trunk Assessment Upper Extremity Assessment Upper Extremity Assessment: Overall WFL for tasks assessed   Lower Extremity Assessment Lower Extremity Assessment: Defer to PT evaluation LLE Deficits / Details: has quad with Knee press, requires assist for SLR, Knee flexion only to about 3o* supine and 60* sitting LLE Sensation: WNL   Cervical / Trunk Assessment Cervical / Trunk Assessment: Normal   Communication Communication Communication:  No difficulties   Cognition Arousal/Alertness: Awake/alert Behavior During Therapy: Impulsive Overall Cognitive Status: Impaired/Different from baseline Area of Impairment: Orientation;Memory;Safety/judgement;Awareness;Problem solving                 Orientation  Level: Disoriented to;Place;Situation   Memory: Decreased short-term memory   Safety/Judgement: Decreased awareness of deficits;Decreased awareness of safety Awareness: Intellectual Problem Solving: Decreased initiation;Requires verbal cues;Requires tactile cues General Comments: Pt perseverating on Trump   General Comments       Exercises Total Joint Exercises Ankle Circles/Pumps: AROM;Both;20 reps;Seated Quad Sets: AROM;Left;10 reps;Supine Heel Slides: Left;AAROM;10 reps;Supine Hip ABduction/ADduction: AROM;Left;10 reps;Supine Straight Leg Raises: AAROM;10 reps;Supine;Left   Shoulder Instructions      Home Living Family/patient expects to be discharged to:: Private residence Living Arrangements: Spouse/significant other Available Help at Discharge: Family;Available 24 hours/day Type of Home: House Home Access: Stairs to enter [employer] of Steps: 3 in back and 6 at front Entrance Stairs-Rails: Can reach both Home Layout: One level     Bathroom Shower/Tub: Chief Strategy Officer: Standard     Home Equipment: Environmental consultant - 2 wheels;Walker - 4 wheels;Cane - single point;Grab bars - toilet;Grab bars - tub/shower;Bedside commode;Electric scooter;Hand held shower head;Shower seat   Additional Comments: pt recently moved in November to a new home      Prior Functioning/Environment Level of Independence: Needs assistance  Gait / Transfers Assistance Needed: using RW for gait since TKA ADL's / Homemaking Assistance Needed: Reports husband assists as needed for bathing (uses tub bench and HH shower head with husband's assist) and toileting. Has been using Depends/diapers "since getting sick." Husband does majority of household tasks. Difficulty reaching feet, wears slip-on shoes, (info from previous admission)            OT Problem List: Decreased knowledge of use of DME or AE;Decreased cognition;Decreased safety awareness;Pain;Impaired balance (sitting  and/or standing)      OT Treatment/Interventions: Self-care/ADL training;DME and/or AE instruction;Patient/family education;Balance training;Therapeutic activities    OT Goals(Current goals can be found in the care plan section) Acute Rehab OT Goals Patient Stated Goal: to go home OT Goal Formulation: With patient Time For Goal Achievement: 08/03/19 Potential to Achieve Goals: Good ADL Goals Pt Will Perform Grooming: with supervision;standing Pt Will Perform Lower Body Bathing: with min assist;with caregiver independent in assisting;sit to/from stand Pt Will Perform Lower Body Dressing: with min assist;sit to/from stand;with caregiver independent in assisting Pt Will Transfer to Toilet: with supervision;ambulating;bedside commode Pt Will Perform Toileting - Clothing Manipulation and hygiene: with supervision;sit to/from stand Additional ADL Goal #1: Pt will be knowledgeble in use and availability of AE for LB ADL. Additional ADL Goal #2: Pt will demonstrate good safety awareness during ADL and mobility.  OT Frequency: Min 2X/week   Barriers to D/C:            Co-evaluation              AM-PAC OT "6 Clicks" Daily Activity     Outcome Measure Help from another person eating meals?: None Help from another person taking care of personal grooming?: A Little Help from another person toileting, which includes using toliet, bedpan, or urinal?: A Lot Help from another person bathing (including washing, rinsing, drying)?: A Lot Help from another person to put on and taking off regular upper body clothing?: A Little Help from another person to put on and taking off regular lower body clothing?: A Lot 6 Click Score: 16   End of Session  Activity Tolerance:   Patient left: in bed;with call bell/phone within reach;with bed alarm set  OT Visit Diagnosis: Pain;Muscle weakness (generalized) (M62.81);Other symptoms and signs involving cognitive function                Time:  1350-1410 OT Time Calculation (min): 20 min Charges:  OT General Charges $OT Visit: 1 Visit OT Evaluation $OT Eval Moderate Complexity: 1 Mod  Nestor Lewandowsky, OTR/L Acute Rehabilitation Services Pager: [phone] Office: [phone]  Malka So 07/20/2019, 2:28 PM

## 2019-07-20 NOTE — Progress Notes (Signed)
PROGRESS NOTE    Hailey Greene  ZOX:[id number] Hailey FlingDOB: [birth year] DOA: 07/19/2019 PCP: Blane Oharaox, Kirsten, MD   Brief Narrative:  HPI per Dr. Odie Seraimothy Opyd on 07/20/19 Hailey FlingKathleen A Greene is a 69 y.o. female with medical history significant for type 2 diabetes mellitus, hypertension, BMI 36, depression, and chronic knee pain status post left total knee arthroplasty on 07/15/2019, now presenting to the emergency department with severe left knee pain and confusion.  The patient is accompanied by her husband who assists with the history.  Patient was discharged from the hospital on 07/16/2019 following her knee replacement, was given prescription for oxycodone in addition to her chronic pain medications she had already been taking, but has continued to complain of severe knee pain despite this and has been confused since returning home.  She has not complained of anything aside from her knee pain but has appeared anxious at times and frequently repeats something along the lines of "get me out of here."  Aside from the oxycodone, there has not been any new medications.  Patient has not fallen or hit her head.  She has not been eating or drinking much since returning home.  She had similar symptoms little over 10 years ago per report of her husband that was suspected secondary to medications at that time and had completely resolved.  ED Course: Upon arrival to the ED, patient is found to be afebrile, saturating well on room air, and with stable blood pressure.  Noncontrast head CT is negative for acute findings.  Chemistry panel with potassium 3.1.  CBC with leukocytosis that has decreased from recent hospitalization.  Patient was given a liter of saline and Zofran in the ED.  Urinalysis and Covid screening tests are pending.  **Interim History  She continues to remain somewhat confused so we will hold her cyclobenzaprine and pain medication.  PT OT recommending home health PT.  Assessment & Plan:   Principal  Problem:   Acute encephalopathy Active Problems:   Benign essential HTN   Chronic pain of left knee   Diabetic polyneuropathy associated with type 2 diabetes mellitus (HCC)   Mild intermittent asthma without complication   S/P total knee replacement   Hypokalemia   Major depressive disorder  Acute Encephalopathy, persisting  -Presents with knee pain and confusion since returning home on 4/20 after left TKA on 4/19  -Head CT negative for acute findings and no focal neurologic deficits identified on exam  -She is on several medications that could cause confusion but had been tolerating these previously and her presentation is more likely secondary to uncontrolled pain and/or dehydration  -We will continue normal saline at 75 MLS per hour for 10 hours; Given NS bolus this AM  -Continue with supportive care and continue to monitor next-we will place on delirium precautions -She is a little bit more awake but confusion still persist so we will continue to monitor her carefully and if she is not improving will consult neurology -We will hold her cyclobenzaprine and we will stop her p.o. hydrocodone -We will continue to replete electrolytes -Judicious Use of Pain Meds and will stop Hydrocodone-Acetaminophen, Oxycodone, and IV morphine -Has been on multiple psychiatric medications including muscle normal, cyclobenzaprine, duloxetine, amitriptyline, pregabalin, ropinirole, and Trintellix these are all home medications; suspect that the interaction with the pain medications that she may become extremely confused -We will stop her hydrocodone-acetaminophen as well as oxycodone and IV Morphine and use po Tramadol and consider IV Ketorolac for significant pain otherwise  will use acetaminophen  Hypokalemia  -Serum potasium is 3.1 in ED  -Is replete this morning and it was 3.5 so we will give her an additional 40 mEq however refused to take it p.o. so we will give it to her IV -Check magnesium level and  it was 2.1X-continue to monitor and replete as necessary -Repeat CMP in a.m.  Depression(MDD) and Anxiety and Neuropathy -She is on Amitriptyline 50 mg p.o. nightly 60 mg p.o. twice daily, Duloxetine  60 mg po BID, Mirtazepine 15 milligrams p.o. nightly as well as Vortioextine HBr and Pregabalin 200 mg po TID -Hold and adjust some of these medications but will definitely stop her Cyclobenzaprine   RLS -C/w Ropinirole 3 mg po qHS  Primary Insomnia -Hold her home Belsomra -C/w Mirtazepine 15 mg po qHS for now   Asthma  -Stable -Continue with Dulera 2 puffs IH twice daily as a substitution for Symbicort  Hypertension  - BP at goal, hold HCTZ 25 mg p.o. daily while hydrating    Type II DM  -She takes Metformin 500 mg p.o. daily with breakfast as well as Victoza -A1c was 5.6% in March 2021  -Check CBGs and use a low-intensity SSI for now   -CBG's ranging from 103-129  Normocytic Anemia -Patient's Hgb/Hct went from 12.5/38.3 -> 10.7/34.1 -Check Anemia Panel in the AM -B12 was low so started supplementation -Continue to Monitor for S/Sx of Bleeding; Currently no overt bleeding noted -Repeat CBC in AM   GERD -Continue with omeprazole substitution with pantoprazole 40 mils p.o. daily  Nausea -? Medication induced -C/w Ondansetron 4 mg IV q6hprn Nausea and Vomiting   Obesity -Estimated body mass index is 38.51 kg/m as calculated from the following:   Height as of this encounter: 5\' 3"  (1.6 m).   Weight as of this encounter: 98.6 kg. -Weight Loss and Dietary Counseling given   DVT prophylaxis: Enoxaparin 40 mg sq q24h Code Status: FULL CODE  Family Communication: Discussed with Husband over the phone Disposition Plan: Patient is from home and she is persistently confused today so we will need to monitor until she gets back to baseline and will go home with home health PT and OT  Status is: Inpatient  Remains inpatient appropriate because:Altered mental  status   Dispo: The patient is from: Home              Anticipated d/c is to: Home              Anticipated d/c date is: 2 days              Patient currently is not medically stable to d/c.   Consultants:   None   Procedures: None  Antimicrobials:  Anti-infectives (From admission, onward)   None     Subjective: Seen and examined at bedside and she is little bit more oriented however still remains somewhat confused and did not know where she lives.  Was more nauseous today.  Denies any chest pain.  States that her knee still bothering her but her main complaint was nausea.  No other concerns or complaints at this time and I updated the husband via the telephone.  Objective: Vitals:   07/20/19 0653 07/20/19 0818 07/20/19 1003 07/20/19 1326  BP: (!) 147/71 140/69 138/61 140/60  Pulse: 94 91 83 87  Resp: 17  14 20   Temp: 97.7 F (36.5 C) 98.6 F (37 C) 98.2 F (36.8 C) 97.6 F (36.4 C)  TempSrc: Oral Oral Oral  Oral  SpO2: 95% 96% 96% 96%  Weight:      Height:        Intake/Output Summary (Last 24 hours) at 07/20/2019 1735 Last data filed at 07/20/2019 1400 Gross per 24 hour  Intake 1939.73 ml  Output 600 ml  Net 1339.73 ml   Filed Weights   07/19/19 1717 07/20/19 0500  Weight: 92.5 kg 98.6 kg   Examination: Physical Exam:  Constitutional: WN/WD obese Caucasian female currently in NAD and appears slightly agitated and mildly uncomfortable Eyes: Lids and conjunctivae normal, sclerae anicteric  ENMT: External Ears, Nose appear normal. Grossly normal hearing.  Neck: Appears normal, supple, no cervical masses, normal ROM, no appreciable thyromegaly; no JVD Respiratory: Diminished to auscultation bilaterally, no wheezing, rales, rhonchi or crackles. Normal respiratory effort and patient is not tachypenic. No accessory muscle use.  Cardiovascular: RRR, no murmurs / rubs / gallops. S1 and S2 auscultated.  Left and knee leg is wrapped Abdomen: Soft, non-tender, distended  secondary body habitus. Bowel sounds positive.  GU: Deferred. Musculoskeletal: No clubbing / cyanosis of digits/nails. No joint deformity upper and lower extremities.  Skin: No rashes, lesions, ulcers on limited skin evaluation. No induration; Warm and dry.  Neurologic: CN 2-12 grossly intact with no focal deficits. Romberg sign cerebellar reflexes not assessed.  Psychiatric: Mildly impaired judgment and insight. Alert and oriented x 1.  Slightly anxious mood and appropriate affect.   Data Reviewed: I have personally reviewed following labs and imaging studies  CBC: Recent Labs  Lab 07/16/19 0307 07/19/19 1820 07/20/19 0437  WBC 14.5* 12.2* 9.3  HGB 11.8* 12.5 10.7*  HCT 38.0 38.3 34.1*  MCV 92.7 88.9 91.7  PLT 267 307 278   Basic Metabolic Panel: Recent Labs  Lab 07/16/19 0307 07/19/19 1820 07/19/19 2200 07/20/19 0437  NA 137 137  --  135  K 4.4 3.1*  --  3.5  CL 101 102  --  105  CO2 25 23  --  23  GLUCOSE 169* 143*  --  122*  BUN 16 20  --  16  CREATININE 0.91 0.70  --  0.68  CALCIUM 8.9 8.7*  --  7.8*  MG  --   --  2.2 2.1  PHOS  --   --   --  3.4   GFR: Estimated Creatinine Clearance: 75.3 mL/min (by C-G formula based on SCr of 0.68 mg/dL). Liver Function Tests: Recent Labs  Lab 07/19/19 1820  AST 17  ALT 18  ALKPHOS 84  BILITOT 1.2  PROT 6.8  ALBUMIN 3.3*   No results for input(s): LIPASE, AMYLASE in the last 168 hours. Recent Labs  Lab 07/19/19 2200  AMMONIA 10   Coagulation Profile: No results for input(s): INR, PROTIME in the last 168 hours. Cardiac Enzymes: No results for input(s): CKTOTAL, CKMB, CKMBINDEX, TROPONINI in the last 168 hours. BNP (last 3 results) No results for input(s): PROBNP in the last 8760 hours. HbA1C: No results for input(s): HGBA1C in the last 72 hours. CBG: Recent Labs  Lab 07/15/19 1128 07/19/19 1757 07/20/19 0747 07/20/19 1154 07/20/19 1658  GLUCAP 108* 144* 129* 116* 103*   Lipid Profile: No results for  input(s): CHOL, HDL, LDLCALC, TRIG, CHOLHDL, LDLDIRECT in the last 72 hours. Thyroid Function Tests: Recent Labs    07/19/19 2200  TSH 0.885   Anemia Panel: Recent Labs    07/19/19 2200  VITAMINB12 114*   Sepsis Labs: No results for input(s): PROCALCITON, LATICACIDVEN in the last 168 hours.  Recent Results (from the past 240 hour(s))  SARS CORONAVIRUS 2 (TAT 6-24 HRS) Nasopharyngeal Nasopharyngeal Swab     Status: None   Collection Time: 07/11/19 11:07 AM   Specimen: Nasopharyngeal Swab  Result Value Ref Range Status   SARS Coronavirus 2 NEGATIVE NEGATIVE Final    Comment: (NOTE) SARS-CoV-2 target nucleic acids are NOT DETECTED. The SARS-CoV-2 RNA is generally detectable in upper and lower respiratory specimens during the acute phase of infection. Negative results do not preclude SARS-CoV-2 infection, do not rule out co-infections with other pathogens, and should not be used as the sole basis for treatment or other patient management decisions. Negative results must be combined with clinical observations, patient history, and epidemiological information. The expected result is Negative. Fact Sheet for Patients: HairSlick.no Fact Sheet for Healthcare Providers: quierodirigir.com This test is not yet approved or cleared by the [place] FDA and  has been authorized for detection and/or diagnosis of SARS-CoV-2 by FDA under an Emergency Use Authorization (EUA). This EUA will remain  in effect (meaning this test can be used) for the duration of the COVID-19 declaration under Section 56 4(b)(1) of the Act, 21 U.S.C. section 360bbb-3(b)(1), unless the authorization is terminated or revoked sooner. Performed at [facility], [address] [place], [place] [address]   Surgical pcr screen     Status: None   Collection Time: 07/11/19 11:51 AM   Specimen: Nasal Mucosa; Nasal Swab  Result Value Ref Range Status    MRSA, PCR NEGATIVE NEGATIVE Final   Staphylococcus aureus NEGATIVE NEGATIVE Final    Comment: (NOTE) The Xpert SA Assay (FDA approved for NASAL specimens in patients 39 years of age and older), is one component of a comprehensive surveillance program. It is not intended to diagnose infection nor to guide or monitor treatment. Performed at [facility], [address], [place], [place] [address]   Respiratory Panel by RT PCR (Flu A&B, Covid) - Nasopharyngeal Swab     Status: None   Collection Time: 07/19/19  8:23 PM   Specimen: Nasopharyngeal Swab  Result Value Ref Range Status   SARS Coronavirus 2 by RT PCR NEGATIVE NEGATIVE Final    Comment: (NOTE) SARS-CoV-2 target nucleic acids are NOT DETECTED. The SARS-CoV-2 RNA is generally detectable in upper respiratoy specimens during the acute phase of infection. The lowest concentration of SARS-CoV-2 viral copies this assay can detect is 131 copies/mL. A negative result does not preclude SARS-Cov-2 infection and should not be used as the sole basis for treatment or other patient management decisions. A negative result may occur with  improper specimen collection/handling, submission of specimen other than nasopharyngeal swab, presence of viral mutation(s) within the areas targeted by this assay, and inadequate number of viral copies (<131 copies/mL). A negative result must be combined with clinical observations, patient history, and epidemiological information. The expected result is Negative. Fact Sheet for Patients:  https://www.moore.com/ Fact Sheet for Healthcare Providers:  https://www.young.biz/ This test is not yet ap proved or cleared by the [place] FDA and  has been authorized for detection and/or diagnosis of SARS-CoV-2 by FDA under an Emergency Use Authorization (EUA). This EUA will remain  in effect (meaning this test can be used) for the duration of  the COVID-19 declaration under Section 564(b)(1) of the Act, 21 U.S.C. section 360bbb-3(b)(1), unless the authorization is terminated or revoked sooner.    Influenza A by PCR NEGATIVE NEGATIVE Final   Influenza B by PCR NEGATIVE NEGATIVE Final  Comment: (NOTE) The Xpert Xpress SARS-CoV-2/FLU/RSV assay is intended as an aid in  the diagnosis of influenza from Nasopharyngeal swab specimens and  should not be used as a sole basis for treatment. Nasal washings and  aspirates are unacceptable for Xpert Xpress SARS-CoV-2/FLU/RSV  testing. Fact Sheet for Patients: PinkCheek.be Fact Sheet for Healthcare Providers: GravelBags.it This test is not yet approved or cleared by the [place] FDA and  has been authorized for detection and/or diagnosis of SARS-CoV-2 by  FDA under an Emergency Use Authorization (EUA). This EUA will remain  in effect (meaning this test can be used) for the duration of the  Covid-19 declaration under Section 564(b)(1) of the Act, 21  U.S.C. section 360bbb-3(b)(1), unless the authorization is  terminated or revoked. Performed at [facility], [place] [address], [place], [place] [address]      RN Pressure Injury Documentation:     Estimated body mass index is 38.51 kg/m as calculated from the following:   Height as of this encounter: 5\' 3"  (1.6 m).   Weight as of this encounter: 98.6 kg.  Malnutrition Type:      Malnutrition Characteristics:      Nutrition Interventions:      Radiology Studies: CT Head Wo Contrast  Result Date: 07/19/2019 CLINICAL DATA:  Altered mental status and confusion since knee surgery 2-3 days prior EXAM: CT HEAD WITHOUT CONTRAST TECHNIQUE: Contiguous axial images were obtained from the base of the skull through the vertex without intravenous contrast. COMPARISON:  CT November 03, 2016 FINDINGS: Brain: Prominent extra-axial CSF collection along the  bilateral frontal convexities, possibly related to more diffuse changes of parenchymal volume loss or possible chronic subdural hygroma, overall unchanged from comparison study. No evidence of acute infarction, hemorrhage, hydrocephalus, extra-axial collection or mass lesion/mass effect. Patchy areas of white matter hypoattenuation are most compatible with chronic microvascular angiopathy. Vascular: This Atherosclerotic calcification of the carotid siphons. No hyperdense vessel. Skull: No calvarial fracture or suspicious osseous lesion. No scalp swelling or hematoma. Sinuses/Orbits: Paranasal sinuses and mastoid air cells are predominantly clear. Orbital structures are unremarkable aside from prior lens extractions. Other: Moderate right TMJ arthrosis. Anterior translation of the left occipital condyle at the time of exam. Possibly positional IMPRESSION: 1. No acute intracranial abnormality. 2. Prominent extra-axial CSF collection along the bilateral frontal convexities, possibly related to more diffuse changes of parenchymal volume loss or possible chronic subdural hygroma, overall unchanged from comparison study. 3. Chronic microvascular angiopathy. 4. Moderate right TMJ arthrosis. Anterior translation of the left mandibular condyle, possibly positional though could correlate for symptoms of TMJ dysfunction. Electronically Signed   By: Lovena Le M.D.   On: 07/19/2019 19:57   Scheduled Meds: . amitriptyline  50 mg Oral QHS  . atorvastatin  20 mg Oral Daily  . darifenacin  7.5 mg Oral Daily  . DULoxetine  60 mg Oral BID  . enoxaparin (LOVENOX) injection  40 mg Subcutaneous QHS  . insulin aspart  0-9 Units Subcutaneous TID WC  . mirtazapine  15 mg Oral QHS  . mometasone-formoterol  2 puff Inhalation BID  . pantoprazole  40 mg Oral Daily  . pregabalin  200 mg Oral TID  . rOPINIRole  3 mg Oral QHS  . sodium chloride flush  3 mL Intravenous Q12H  . vitamin B-12  1,000 mcg Oral Daily  . vortioxetine  HBr  10 mg Oral Daily   Continuous Infusions: . sodium chloride 20 mL/hr at 07/20/19 1018  . potassium chloride  LOS: 0 days   Kerney Elbe, DO [facility] PAGER is on AMION  If 7PM-7AM, please contact night-coverage [email or web address]

## 2019-07-21 LAB — CBC WITH DIFFERENTIAL/PLATELET
Abs Immature Granulocytes: 0.16 10*3/uL — ABNORMAL HIGH (ref 0.00–0.07)
Basophils Absolute: 0 10*3/uL (ref 0.0–0.1)
Basophils Relative: 0 %
Eosinophils Absolute: 0.3 10*3/uL (ref 0.0–0.5)
Eosinophils Relative: 3 %
HCT: 33.4 % — ABNORMAL LOW (ref 36.0–46.0)
Hemoglobin: 10.6 g/dL — ABNORMAL LOW (ref 12.0–15.0)
Immature Granulocytes: 2 %
Lymphocytes Relative: 22 %
Lymphs Abs: 2.2 10*3/uL (ref 0.7–4.0)
MCH: 28.8 pg (ref 26.0–34.0)
MCHC: 31.7 g/dL (ref 30.0–36.0)
MCV: 90.8 fL (ref 80.0–100.0)
Monocytes Absolute: 0.5 10*3/uL (ref 0.1–1.0)
Monocytes Relative: 5 %
Neutro Abs: 6.7 10*3/uL (ref 1.7–7.7)
Neutrophils Relative %: 68 %
Platelets: 268 10*3/uL (ref 150–400)
RBC: 3.68 MIL/uL — ABNORMAL LOW (ref 3.87–5.11)
RDW: 14.3 % (ref 11.5–15.5)
WBC: 9.9 10*3/uL (ref 4.0–10.5)
nRBC: 0 % (ref 0.0–0.2)

## 2019-07-21 LAB — COMPREHENSIVE METABOLIC PANEL
ALT: 10 U/L (ref 0–44)
AST: 11 U/L — ABNORMAL LOW (ref 15–41)
Albumin: 2.7 g/dL — ABNORMAL LOW (ref 3.5–5.0)
Alkaline Phosphatase: 64 U/L (ref 38–126)
Anion gap: 7 (ref 5–15)
BUN: 12 mg/dL (ref 8–23)
CO2: 23 mmol/L (ref 22–32)
Calcium: 8.1 mg/dL — ABNORMAL LOW (ref 8.9–10.3)
Chloride: 104 mmol/L (ref 98–111)
Creatinine, Ser: 0.7 mg/dL (ref 0.44–1.00)
GFR calc Af Amer: >60 mL/min (ref >60)
GFR calc non Af Amer: >60 mL/min (ref >60)
Glucose, Bld: 83 mg/dL (ref 70–99)
Potassium: 3.7 mmol/L (ref 3.5–5.1)
Sodium: 134 mmol/L — ABNORMAL LOW (ref 135–145)
Total Bilirubin: 0.7 mg/dL (ref 0.3–1.2)
Total Protein: 5.6 g/dL — ABNORMAL LOW (ref 6.5–8.1)

## 2019-07-21 LAB — GLUCOSE, CAPILLARY
Glucose-Capillary: 81 mg/dL (ref 70–99)
Glucose-Capillary: 143 mg/dL — ABNORMAL HIGH (ref 70–99)

## 2019-07-21 LAB — MAGNESIUM: Magnesium: 2 mg/dL (ref 1.7–2.4)

## 2019-07-21 LAB — PHOSPHORUS: Phosphorus: 4 mg/dL (ref 2.5–4.6)

## 2019-07-21 MED ORDER — HYDROCHLOROTHIAZIDE 12.5 MG PO TABS: 12.5000 mg | ORAL_TABLET | Freq: Every day | ORAL | 0 refills | Status: DC

## 2019-07-21 MED ORDER — CYANOCOBALAMIN 1000 MCG PO TABS: 1000.0000 ug | ORAL_TABLET | Freq: Every day | ORAL | 0 refills | Status: DC

## 2019-07-21 MED ORDER — SENNOSIDES-DOCUSATE SODIUM 8.6-50 MG PO TABS: 1.0000 | ORAL_TABLET | Freq: Every evening | ORAL | 0 refills | Status: DC | PRN

## 2019-07-21 NOTE — Discharge Summary (Signed)
Physician Discharge Summary  JAZSMINE MACARI YHC:[id number] DOB: 1950-07-20 DOA: 07/19/2019  PCP: Blane Ohara, MD  Admit date: 07/19/2019 Discharge date: 07/21/2019  Admitted From: Home Disposition: Home with Home Health PT/OT  Recommendations for Outpatient Follow-up:  1. Follow up with PCP in 1-2 weeks 2. Follow up with Orthopedic Surgery within 1-2 weeks; Has an appointment in AM  3. Please obtain CMP/CBC, Mag, Phos in one week 4. Please follow up on the following pending results:  Home Health: YES Equipment/Devices: None  Discharge Condition: Stable  CODE STATUS: FULL CODE Diet recommendation: Heart Healthy Carb Modified Diet  Brief/Interim Summary: HPI per Dr. Marcial Pacas Opyd on 07/20/19 Hailey Million Bryantis a 69 y.o.femalewith medical history significant fortype 2 diabetes mellitus, hypertension, BMI 36, depression, and chronic knee pain status post left total knee arthroplasty on 07/15/2019, now presenting to the emergency department with severe left knee pain and confusion. The patient is accompanied by her husband who assists with the history. Patient was discharged from the hospital on 07/16/2019 following her knee replacement, was given prescription for oxycodone in addition to her chronic pain medications she had already been taking, but has continued to complain of severe knee pain despite this and has been confused since returning home. She has not complained of anything aside from her knee pain but has appeared anxious at times and frequently repeats something along the lines of "get me out of here." Aside from the oxycodone, there has not been any new medications. Patient has not fallen or hit her head. She has not been eating or drinking much since returning home. She had similar symptoms little over 10 years ago per report of her husband that was suspected secondary to medications at that time and had completely resolved.  ED Course:Upon arrival to the ED, patient is  found to be afebrile, saturating well on room air, and with stable blood pressure. Noncontrast head CT is negative for acute findings. Chemistry panel with potassium 3.1. CBC with leukocytosis that has decreased from recent hospitalization. Patient was given a liter of saline and Zofran in the ED. Urinalysis and Covid screening tests are pending.  **Interim History  She continued to remain somewhat confused so we will hold her cyclobenzaprine and pain medication.  PT OT recommending home health PT. Today she is much more awake and alert and back to baseline.  Labs are relatively stable and she did have a mild hyponatremia but her hemoglobin/marker is stable from yesterday.  With her primary care physician for further medications adjustments and she has a orthopedic appointment in the a.m.  Currently she is stable for discharge given that she is back to her baseline and husband was updated and appreciated of care.  Discharge Diagnoses:  Principal Problem:   Acute encephalopathy Active Problems:   Benign essential HTN   Chronic pain of left knee   Diabetic polyneuropathy associated with type 2 diabetes mellitus (HCC)   Mild intermittent asthma without complication   S/P total knee replacement   Hypokalemia   Major depressive disorder  Acute Encephalopathy now improved -Presented with knee pain and confusion since returning home on 4/20 after left TKA on 4/19 -Head CT negative for acute findings and no focal neurologic deficits identified on exam -She is on several medications that could cause confusion but had been tolerating these previously and her presentation is more likely secondary to uncontrolled pain and/or dehydration -We will continue normal saline at 75 MLS per hour for 10 hours; Given NS bolus this  AM  -Continue with supportive care and continue to monitor next-we will place on delirium precautions -She is a little bit more awake but confusion still persist so we will  continue to monitor her carefully and if she is not improving will consult neurology -We will hold her cyclobenzaprine and we will stop her p.o. hydrocodone and stop these at discharge -We will continue to replete electrolytes -Judicious Use of Pain Meds and will stop Hydrocodone-Acetaminophen, Oxycodone, and IV morphine; we resumed her home tramadol -Has been on multiple psychiatric medications including muscle normal, cyclobenzaprine, duloxetine, amitriptyline, pregabalin, ropinirole, and Trintellix these are all home medications; suspect that the interaction with the pain medications that she may become extremely confused -We will stop her hydrocodone-acetaminophen as well as oxycodone and IV Morphine and use po Tramadol and consider IV Ketorolac for significant pain otherwise will use acetaminophen -Follow-up with PCP for further medication adjustments and titrating off of some of her other medications  Hypokalemia -Serum potasium is 3.1 in ED  -I is now improved to 3.7 -Check magnesium level and it was 2.0 -continue to monitor and replete as necessary -Repeat CMP within 1 week  Depression(MDD)and Anxiety and Neuropathy -She is on Amitriptyline 50 mg p.o. nightly 60 mg p.o. twice daily, Duloxetine  60 mg po BID, Mirtazepine 15 milligrams p.o. nightly as well as Vortioextine HBr and Pregabalin 200 mg po TID -Hold and adjust some of these medications but will definitely stop her Cyclobenzaprine  -Have her PCP follow-up and adjust on her medications as well  RLS -C/w Ropinirole 3 mg po qHS  Primary Insomnia -Hold her home Belsomra and recommend stopping this at discharge -C/w Mirtazepine 15 mg po qHS for now   Asthma -Stable -Continue with Dulera 2 puffs IH twice daily as a substitution for Symbicort  Hypertension -BP at goal, hold HCTZ 25 mg p.o. daily while hydratingand will resume at half the dose at 12.5 mg daily  Type II DM -She takes Metformin 500 mg p.o. daily  with breakfast as well as Victoza -A1c was 5.6% in March 2021 -Check CBGs and use a low-intensity SSI for now -CBG's ranging from 81-1 43  Normocytic Anemia -Patient's Hgb/Hct went from 12.5/38.3 -> 10.7/34.1 -> 10.6/33.4 -Check Anemia Panel as an outpatient -B12 was low so started supplementation -Continue to Monitor for S/Sx of Bleeding; Currently no overt bleeding noted -Repeat CBC in AM   GERD -Continue with omeprazole substitution with pantoprazole 40 mils p.o. daily while hospitalized and resume home medications  Nausea -? Medication induced and this is much improved -C/w Ondansetron 4 mg IV q6hprn Nausea and Vomiting   Obesity -Estimated body mass index is 38.51 kg/m as calculated from the following:   Height as of this encounter: 5\' 3"  (1.6 m).   Weight as of this encounter: 98.6 kg. -Weight Loss and Dietary Counseling given   Hyponatremia -Mild at 134 -Continue monitor and trend in the outpatient setting repeat CMP within 1 week  Discharge Instructions  Discharge Instructions    Call MD for:  difficulty breathing, headache or visual disturbances   Complete by: As directed    Call MD for:  extreme fatigue   Complete by: As directed    Call MD for:  hives   Complete by: As directed    Call MD for:  persistant dizziness or light-headedness   Complete by: As directed    Call MD for:  persistant nausea and vomiting   Complete by: As directed    Call  MD for:  redness, tenderness, or signs of infection (pain, swelling, redness, odor or green/yellow discharge around incision site)   Complete by: As directed    Call MD for:  severe uncontrolled pain   Complete by: As directed    Call MD for:  temperature >100.4   Complete by: As directed    Diet - low sodium heart healthy   Complete by: As directed    Diet Carb Modified   Complete by: As directed    Discharge instructions   Complete by: As directed    You were cared for by a hospitalist during your  hospital stay. If you have any questions about your discharge medications or the care you received while you were in the hospital after you are discharged, you can call the unit and ask to speak with the hospitalist on call if the hospitalist that took care of you is not available. Once you are discharged, your primary care physician will handle any further medical issues. Please note that NO REFILLS for any discharge medications will be authorized once you are discharged, as it is imperative that you return to your primary care physician (or establish a relationship with a primary care physician if you do not have one) for your aftercare needs so that they can reassess your need for medications and monitor your lab values.  Follow up with PCP and Orthopedic Surgery within 1-2 weeks. Take all medications as prescribed. If symptoms change or worsen please return to the ED for evaluation   Increase activity slowly   Complete by: As directed      Allergies as of 07/21/2019   No Known Allergies     Medication List    STOP taking these medications   Belsomra 10 MG Tabs Generic drug: Suvorexant   cyclobenzaprine 10 MG tablet Commonly known as: FLEXERIL   oxyCODONE 5 MG immediate release tablet Commonly known as: Oxy IR/ROXICODONE     TAKE these medications   acetaminophen 500 MG tablet Commonly known as: TYLENOL Take 500-1,000 mg by mouth every 6 (six) hours as needed (for pain).   amitriptyline 50 MG tablet Commonly known as: ELAVIL Take 50 mg by mouth at bedtime.   aspirin 325 MG EC tablet Take 1 tablet (325 mg total) by mouth 2 (two) times daily.   atorvastatin 20 MG tablet Commonly known as: LIPITOR TAKE ONE (1) TABLET ONCE DAILY What changed: See the new instructions.   cyanocobalamin 1000 MCG tablet Take 1 tablet (1,000 mcg total) by mouth daily. Start taking on: July 22, 2019   dicyclomine 20 MG tablet Commonly known as: BENTYL TAKE ONE TABLET EVERY 6 HOURS AS  NEEDED What changed: See the new instructions.   DULoxetine 60 MG capsule Commonly known as: CYMBALTA Take 60 mg by mouth 2 (two) times daily.   hydrochlorothiazide 12.5 MG tablet Commonly known as: HYDRODIURIL Take 1 tablet (12.5 mg total) by mouth daily. What changed:   medication strength  how much to take   metFORMIN 500 MG tablet Commonly known as: GLUCOPHAGE Take 500 mg by mouth daily with breakfast.   mirtazapine 15 MG tablet Commonly known as: REMERON Take 15 mg by mouth at bedtime.   multivitamin with minerals Tabs tablet Take 1 tablet by mouth daily.   omeprazole 20 MG capsule Commonly known as: PRILOSEC TAKE ONE CAPSULE BY MOUTH DAILY BEFORE AMEAL What changed: See the new instructions.   ondansetron 4 MG tablet Commonly known as: ZOFRAN TAKE ONE TABLET BY  MOUTH EVERY EIGHT HOURS AS NEEDED NAUSEA What changed:   how much to take  how to take this  when to take this  reasons to take this  additional instructions   OneTouch Delica Plus [id number] Misc   OneTouch Verio test strip Generic drug: glucose blood   pregabalin 200 MG capsule Commonly known as: LYRICA TAKE ONE (1) CAPSULE THREE (3) TIMES EACH DAY What changed: See the new instructions.   rOPINIRole 3 MG tablet Commonly known as: REQUIP Take 3 mg by mouth at bedtime.   senna-docusate 8.6-50 MG tablet Commonly known as: Senokot-S Take 1 tablet by mouth at bedtime as needed for mild constipation.   solifenacin 5 MG tablet Commonly known as: VESICARE TAKE ONE (1) TABLET ONCE DAILY What changed: See the new instructions.   Symbicort 160-4.5 MCG/ACT inhaler Generic drug: budesonide-formoterol Inhale 2 puffs into the lungs 2 times daily at 12 noon and 4 pm.   traMADol 50 MG tablet Commonly known as: ULTRAM Take 50-100 mg by mouth every 6 (six) hours as needed (pain).   Trintellix 10 MG Tabs tablet Generic drug: vortioxetine HBr TAKE 1 TABLET BY MOUTH ONCE DAILY AT [facility] TIME  [facility] DAY What changed: See the new instructions.   Victoza 18 MG/3ML Sopn Generic drug: liraglutide INJECT 1.2 MG ONCE DAILY What changed: See the new instructions.       No Known Allergies  Consultations:  None  Procedures/Studies: CT Head Wo Contrast  Result Date: 07/19/2019 CLINICAL DATA:  Altered mental status and confusion since knee surgery 2-3 days prior EXAM: CT HEAD WITHOUT CONTRAST TECHNIQUE: Contiguous axial images were obtained from the base of the skull through the vertex without intravenous contrast. COMPARISON:  CT November 03, 2016 FINDINGS: Brain: Prominent extra-axial CSF collection along the bilateral frontal convexities, possibly related to more diffuse changes of parenchymal volume loss or possible chronic subdural hygroma, overall unchanged from comparison study. No evidence of acute infarction, hemorrhage, hydrocephalus, extra-axial collection or mass lesion/mass effect. Patchy areas of white matter hypoattenuation are most compatible with chronic microvascular angiopathy. Vascular: This Atherosclerotic calcification of the carotid siphons. No hyperdense vessel. Skull: No calvarial fracture or suspicious osseous lesion. No scalp swelling or hematoma. Sinuses/Orbits: Paranasal sinuses and mastoid air cells are predominantly clear. Orbital structures are unremarkable aside from prior lens extractions. Other: Moderate right TMJ arthrosis. Anterior translation of the left occipital condyle at the time of exam. Possibly positional IMPRESSION: 1. No acute intracranial abnormality. 2. Prominent extra-axial CSF collection along the bilateral frontal convexities, possibly related to more diffuse changes of parenchymal volume loss or possible chronic subdural hygroma, overall unchanged from comparison study. 3. Chronic microvascular angiopathy. 4. Moderate right TMJ arthrosis. Anterior translation of the left mandibular condyle, possibly positional though could correlate for symptoms of  TMJ dysfunction. Electronically Signed   By: Lovena Le M.D.   On: 07/19/2019 19:57     Subjective: Seen and examined at bedside she is much more awake and alert and oriented.  She had no complaints and states that her knee is doing okay.  No nausea or vomiting.  She is back to baseline and she is stable for discharge and she will follow up with orthopedic surgery in the a.m.  Gust with her about titrating off of her some of her medications and she understands agrees with plan of care and she will follow with PCP for this.  Husband was updated via telephone and he is appreciative care and had no further questions.  Discharge Exam: Vitals:   07/21/19 0600 07/21/19 0914  BP: 134/67   Pulse: 77   Resp: 18   Temp: 98.4 F (36.9 C)   SpO2: 97% 95%   Vitals:   07/20/19 2011 07/21/19 0500 07/21/19 0600 07/21/19 0914  BP: (!) 141/77  134/67   Pulse: 88  77   Resp: 20  18   Temp: 99.1 F (37.3 C)  98.4 F (36.9 C)   TempSrc: Oral  Oral   SpO2: 97%  97% 95%  Weight:  96.7 kg    Height:       General: Pt is alert, awake, not in acute distress Cardiovascular: RRR, S1/S2 +, no rubs, no gallops Respiratory: Slightly diminished bilaterally, no wheezing, no rhonchi Abdominal: Soft, NT, distended secondary body habitus, bowel sounds + Extremities: Trace edema, no cyanosis; left knee incisions appear clean dry intact  The results of significant diagnostics from this hospitalization (including imaging, microbiology, ancillary and laboratory) are listed below for reference.    Microbiology: Recent Results (from the past 240 hour(s))  Respiratory Panel by RT PCR (Flu A&B, Covid) - Nasopharyngeal Swab     Status: None   Collection Time: 07/19/19  8:23 PM   Specimen: Nasopharyngeal Swab  Result Value Ref Range Status   SARS Coronavirus 2 by RT PCR NEGATIVE NEGATIVE Final    Comment: (NOTE) SARS-CoV-2 target nucleic acids are NOT DETECTED. The SARS-CoV-2 RNA is generally detectable in upper  respiratoy specimens during the acute phase of infection. The lowest concentration of SARS-CoV-2 viral copies this assay can detect is 131 copies/mL. A negative result does not preclude SARS-Cov-2 infection and should not be used as the sole basis for treatment or other patient management decisions. A negative result may occur with  improper specimen collection/handling, submission of specimen other than nasopharyngeal swab, presence of viral mutation(s) within the areas targeted by this assay, and inadequate number of viral copies (<131 copies/mL). A negative result must be combined with clinical observations, patient history, and epidemiological information. The expected result is Negative. Fact Sheet for Patients:  https://www.moore.com/https://www.fda.gov/media/142436/download Fact Sheet for Healthcare Providers:  https://www.young.biz/https://www.fda.gov/media/142435/download This test is not yet ap proved or cleared by the [place] FDA and  has been authorized for detection and/or diagnosis of SARS-CoV-2 by FDA under an Emergency Use Authorization (EUA). This EUA will remain  in effect (meaning this test can be used) for the duration of the COVID-19 declaration under Section 564(b)(1) of the Act, 21 U.S.C. section 360bbb-3(b)(1), unless the authorization is terminated or revoked sooner.    Influenza A by PCR NEGATIVE NEGATIVE Final   Influenza B by PCR NEGATIVE NEGATIVE Final    Comment: (NOTE) The Xpert Xpress SARS-CoV-2/FLU/RSV assay is intended as an aid in  the diagnosis of influenza from Nasopharyngeal swab specimens and  should not be used as a sole basis for treatment. Nasal washings and  aspirates are unacceptable for Xpert Xpress SARS-CoV-2/FLU/RSV  testing. Fact Sheet for Patients: https://www.moore.com/https://www.fda.gov/media/142436/download Fact Sheet for Healthcare Providers: https://www.young.biz/https://www.fda.gov/media/142435/download This test is not yet approved or cleared by the [place] FDA and  has been authorized for  detection and/or diagnosis of SARS-CoV-2 by  FDA under an Emergency Use Authorization (EUA). This EUA will remain  in effect (meaning this test can be used) for the duration of the  Covid-19 declaration under Section 564(b)(1) of the Act, 21  U.S.C. section 360bbb-3(b)(1), unless the authorization is  terminated or revoked. Performed at [facility], [address] [place],  [place] [address]     Labs: BNP (last 3 results) Recent Labs    01/02/19 0207 01/03/19 0437 01/04/19 0400  BNP 62.9 29.1 57.9   Basic Metabolic Panel: Recent Labs  Lab 07/16/19 0307 07/19/19 1820 07/19/19 2200 07/20/19 0437 07/21/19 0529  NA 137 137  --  135 134*  K 4.4 3.1*  --  3.5 3.7  CL 101 102  --  105 104  CO2 25 23  --  23 23  GLUCOSE 169* 143*  --  122* 83  BUN 16 20  --  16 12  CREATININE 0.91 0.70  --  0.68 0.70  CALCIUM 8.9 8.7*  --  7.8* 8.1*  MG  --   --  2.2 2.1 2.0  PHOS  --   --   --  3.4 4.0   Liver Function Tests: Recent Labs  Lab 07/19/19 1820 07/21/19 0529  AST 17 11*  ALT 18 10  ALKPHOS 84 64  BILITOT 1.2 0.7  PROT 6.8 5.6*  ALBUMIN 3.3* 2.7*   No results for input(s): LIPASE, AMYLASE in the last 168 hours. Recent Labs  Lab 07/19/19 2200  AMMONIA 10   CBC: Recent Labs  Lab 07/16/19 0307 07/19/19 1820 07/20/19 0437 07/21/19 0529  WBC 14.5* 12.2* 9.3 9.9  NEUTROABS  --   --   --  6.7  HGB 11.8* 12.5 10.7* 10.6*  HCT 38.0 38.3 34.1* 33.4*  MCV 92.7 88.9 91.7 90.8  PLT 267 307 278 268   Cardiac Enzymes: No results for input(s): CKTOTAL, CKMB, CKMBINDEX, TROPONINI in the last 168 hours. BNP: Invalid input(s): POCBNP CBG: Recent Labs  Lab 07/20/19 1154 07/20/19 1658 07/20/19 2016 07/21/19 0753 07/21/19 1146  GLUCAP 116* 103* 94 81 143*   D-Dimer No results for input(s): DDIMER in the last 72 hours. Hgb A1c No results for input(s): HGBA1C in the last 72 hours. Lipid Profile No results for input(s): CHOL, HDL, LDLCALC, TRIG,  CHOLHDL, LDLDIRECT in the last 72 hours. Thyroid function studies Recent Labs    07/19/19 2200  TSH 0.885   Anemia work up Recent Labs    07/19/19 2200  VITAMINB12 114*   Urinalysis    Component Value Date/Time   COLORURINE YELLOW 07/19/2019 1804   APPEARANCEUR CLEAR 07/19/2019 1804   LABSPEC 1.020 07/19/2019 1804   PHURINE 6.0 07/19/2019 1804   GLUCOSEU NEGATIVE 07/19/2019 1804   HGBUR NEGATIVE 07/19/2019 1804   BILIRUBINUR NEGATIVE 07/19/2019 1804   BILIRUBINUR negative 05/31/2019 1120   KETONESUR 20 (A) 07/19/2019 1804   PROTEINUR NEGATIVE 07/19/2019 1804   UROBILINOGEN 0.2 05/31/2019 1120   NITRITE NEGATIVE 07/19/2019 1804   LEUKOCYTESUR NEGATIVE 07/19/2019 1804   Sepsis Labs Invalid input(s): PROCALCITONIN,  WBC,  LACTICIDVEN Microbiology Recent Results (from the past 240 hour(s))  Respiratory Panel by RT PCR (Flu A&B, Covid) - Nasopharyngeal Swab     Status: None   Collection Time: 07/19/19  8:23 PM   Specimen: Nasopharyngeal Swab  Result Value Ref Range Status   SARS Coronavirus 2 by RT PCR NEGATIVE NEGATIVE Final    Comment: (NOTE) SARS-CoV-2 target nucleic acids are NOT DETECTED. The SARS-CoV-2 RNA is generally detectable in upper respiratoy specimens during the acute phase of infection. The lowest concentration of SARS-CoV-2 viral copies this assay can detect is 131 copies/mL. A negative result does not preclude SARS-Cov-2 infection and should not be used as the sole basis for treatment or other patient management decisions. A negative result may occur with  improper specimen collection/handling, submission of specimen other than nasopharyngeal swab, presence of viral mutation(s) within the areas targeted by this assay, and inadequate number of viral copies (<131 copies/mL). A negative result must be combined with clinical observations, patient history, and epidemiological information. The expected result is Negative. Fact Sheet for Patients:   https://www.moore.com/ Fact Sheet for Healthcare Providers:  https://www.young.biz/ This test is not yet ap proved or cleared by the [place] FDA and  has been authorized for detection and/or diagnosis of SARS-CoV-2 by FDA under an Emergency Use Authorization (EUA). This EUA will remain  in effect (meaning this test can be used) for the duration of the COVID-19 declaration under Section 564(b)(1) of the Act, 21 U.S.C. section 360bbb-3(b)(1), unless the authorization is terminated or revoked sooner.    Influenza A by PCR NEGATIVE NEGATIVE Final   Influenza B by PCR NEGATIVE NEGATIVE Final    Comment: (NOTE) The Xpert Xpress SARS-CoV-2/FLU/RSV assay is intended as an aid in  the diagnosis of influenza from Nasopharyngeal swab specimens and  should not be used as a sole basis for treatment. Nasal washings and  aspirates are unacceptable for Xpert Xpress SARS-CoV-2/FLU/RSV  testing. Fact Sheet for Patients: https://www.moore.com/ Fact Sheet for Healthcare Providers: https://www.young.biz/ This test is not yet approved or cleared by the [place] FDA and  has been authorized for detection and/or diagnosis of SARS-CoV-2 by  FDA under an Emergency Use Authorization (EUA). This EUA will remain  in effect (meaning this test can be used) for the duration of the  Covid-19 declaration under Section 564(b)(1) of the Act, 21  U.S.C. section 360bbb-3(b)(1), unless the authorization is  terminated or revoked. Performed at [facility], [address], [place], [place] [address]    Time coordinating discharge: 35 minutes  SIGNED:  Merlene Laughter, DO [facility] 07/21/2019, 12:27 PM Pager is on AMION  If 7PM-7AM, please contact night-coverage [email or web address] Password [email or web address]

## 2019-07-21 NOTE — Progress Notes (Signed)
Physical Therapy Treatment Patient Details Name: Hailey Greene MRN: [id number] DOB: 01-23-1951 Today's Date: 07/21/2019    History of Present Illness 69 y.o. female s/p Lt TKA on 07/15/19 with PMH significant for OA, neuropathy, HLS, depression, HTN, DM,  COVID-19 12/2018. Admitted 07/19/19 with AMS, hallucinating, not eating and drinking. CT head normal.    PT Comments    Progressing with mobility. Pt was able to walk a short distance with use of a RW. Gait is antalgic but she was able to tolerate some Wbing. Pt stated she felt her husband and son would be able to get her into the house safely. She seems to be set to dc home on today.     Follow Up Recommendations  Home health PT;Supervision/Assistance - 24 hour     Equipment Recommendations  None recommended by PT    Recommendations for Other Services       Precautions / Restrictions Precautions Precautions: Fall Precaution Comments: reviewed no pillow under knee Restrictions Weight Bearing Restrictions: No Other Position/Activity Restrictions: WBAT    Mobility  Bed Mobility Overal bed mobility: Needs Assistance Bed Mobility: Supine to Sit;Sit to Supine     Supine to sit: Min assist;HOB elevated Sit to supine: HOB elevated;Min assist   General bed mobility comments: Assist for L LE. Increased time.  Transfers Overall transfer level: Needs assistance Equipment used: Rolling walker (2 wheeled) Transfers: Sit to/from Stand Sit to Stand: Min guard         General transfer comment: close guard for safety. Cues for safety, hand placement.  Ambulation/Gait Ambulation/Gait assistance: Min guard Gait Distance (Feet): 15 Feet Assistive device: Rolling walker (2 wheeled) Gait Pattern/deviations: Antalgic;Step-to pattern     General Gait Details: Pt able to walk around the room with use of a RW. Gait is antalgic but she can weight bear with use of the RW. Remained in room-pt did not wish to go out into the  hallway.   Stairs             Wheelchair Mobility    Modified Rankin (Stroke Patients Only)       Balance Overall balance assessment: Needs assistance         Standing balance support: Bilateral upper extremity supported Standing balance-Leahy Scale: Poor                              Cognition Arousal/Alertness: Awake/alert Behavior During Therapy: Impulsive                                 Problem Solving: Requires verbal cues General Comments: improved cognition on today      Exercises      General Comments        Pertinent Vitals/Pain Pain Assessment: Faces Faces Pain Scale: Hurts even more Pain Location: L knee Pain Descriptors / Indicators: Discomfort;Sore Pain Intervention(s): Limited activity within patient's tolerance;Monitored during session;Repositioned    Home Living                      Prior Function            PT Goals (current goals can now be found in the care plan section) Progress towards PT goals: Progressing toward goals    Frequency    Min 5X/week      PT Plan Current plan remains appropriate  Co-evaluation              AM-PAC PT "6 Clicks" Mobility   Outcome Measure  Help needed turning from your back to your side while in a flat bed without using bedrails?: A Little Help needed moving from lying on your back to sitting on the side of a flat bed without using bedrails?: A Little Help needed moving to and from a bed to a chair (including a wheelchair)?: A Little Help needed standing up from a chair using your arms (e.g., wheelchair or bedside chair)?: A Little Help needed to walk in hospital room?: A Little Help needed climbing 3-5 steps with a railing? : A Lot 6 Click Score: 17    End of Session   Activity Tolerance: Patient limited by pain Patient left: in bed;with call bell/phone within reach;with bed alarm set   PT Visit Diagnosis: Difficulty in walking, not  elsewhere classified (R26.2);Pain Pain - Right/Left: Left Pain - part of body: Knee     Time: 1155-1208 PT Time Calculation (min) (ACUTE ONLY): 13 min  Charges:  $Gait Training: 8-22 mins                         Faye Ramsay, PT Acute Rehabilitation

## 2019-07-21 NOTE — Progress Notes (Signed)
Patient had some complaints of knee pain. Discussed the importance of wearing the bone foam with the patient. Encouraged patient to have husband bring bone foam to her while in hospital/use when at home. Also reminded patient of the importance of keeping knee/leg straight during recovery. Patient stated she would have her husband bring her the bone foam. Ortho tech was contacted to see if they had any bone foams, but no response.

## 2019-07-21 NOTE — TOC Progression Note (Signed)
Transition of Care [facility]) - Progression Note    Patient Details  Name: Hailey Greene MRN: [id number] Date of Birth: [birth year]  [facility] CM/SW Contact  Armanda Heritage, RN Phone Number: 07/21/2019, 1:23 PM  Clinical Narrative:  CM spoke with patient who reports she was expecting to receive [facility] services after her surgery but did not get a call from any agency and had to return to the hospital before anyone came out to see her.  Patient set up with [facility] for HHPT/OT.  CM spoke with patient to notify her that this arrangement was made, patient was getting into her vehicle at the time of conversation to transport home.  Per patient requests CM called her on her cell phone number and left a voicemail with [facility] number.  Patient is aware that agency can come out on Wednesday at the latest, CM requested that agency come out earlier if at all possible.  Patient has all dme at home.        Expected Discharge Plan: Home w Home Health Services Barriers to Discharge: No Barriers Identified  Expected Discharge Plan and Services Expected Discharge Plan: Home w Home Health Services   Discharge Planning Services: CM Consult Post Acute Care Choice: Home Health Living arrangements for the past 2 months: Single Family Home Expected Discharge Date: 07/21/19                         HH Arranged: PT, OT HH Agency: [facility] (formerly [facility]) Date HH Agency Contacted: 07/21/19 Time HH Agency Contacted: 1323 Representative spoke with at [facility] Agency: Laurelyn Sickle   Social Determinants of Health (SDOH) Interventions    Readmission Risk Interventions No flowsheet data found.

## 2019-07-21 NOTE — Progress Notes (Signed)
All discharge information including follow up appts and medications discussed with patient and husband at bedside. IV removed. All personal belongings sent with patient. No further questions.

## 2019-07-25 DIAGNOSIS — Z96659 Presence of unspecified artificial knee joint: Secondary | ICD-10-CM

## 2019-07-25 HISTORY — DX: Presence of unspecified artificial knee joint: Z96.659

## 2019-07-30 NOTE — Progress Notes (Signed)
Cancelled. Kc  

## 2019-07-31 ENCOUNTER — Ambulatory Visit (INDEPENDENT_AMBULATORY_CARE_PROVIDER_SITE_OTHER): Payer: 59 | Admitting: Family Medicine

## 2019-07-31 DIAGNOSIS — Z96659 Presence of unspecified artificial knee joint: Secondary | ICD-10-CM

## 2019-08-02 ENCOUNTER — Other Ambulatory Visit: Payer: Self-pay | Admitting: Family Medicine

## 2019-08-06 NOTE — Progress Notes (Signed)
No showed/cancelled. kc 

## 2019-08-07 ENCOUNTER — Telehealth (INDEPENDENT_AMBULATORY_CARE_PROVIDER_SITE_OTHER): Payer: 59 | Admitting: Physician Assistant

## 2019-08-07 ENCOUNTER — Encounter: Payer: Self-pay | Admitting: Physician Assistant

## 2019-08-07 VITALS — BP 151/81 | HR 104

## 2019-08-07 DIAGNOSIS — R110 Nausea: Secondary | ICD-10-CM | POA: Diagnosis not present

## 2019-08-07 MED ORDER — PROMETHAZINE HCL 25 MG PO TABS
25.0000 mg | ORAL_TABLET | Freq: Three times a day (TID) | ORAL | 1 refills | Status: DC | PRN
Start: 2019-08-07 — End: 2019-08-13

## 2019-08-07 NOTE — Assessment & Plan Note (Signed)
RECOMMEND CLEAR LIQUIDS/BLAND DIET STOP ZOFRAN AND TRY PHENERGAN INSTEAD  FOLLOW UP IF SYMPTOMS PERSIST , CHANGE OR WORSEN

## 2019-08-07 NOTE — Progress Notes (Signed)
Virtual Visit via Telephone Note   This visit type was conducted due to national recommendations for restrictions regarding the COVID-19 Pandemic (e.g. social distancing) in an effort to limit this patient's exposure and mitigate transmission in our community.  Due to her co-morbid illnesses, this patient is at least at moderate risk for complications without adequate follow up.  This format is felt to be most appropriate for this patient at this time.  The patient did not have access to video technology/had technical difficulties with video requiring transitioning to audio format only (telephone).  All issues noted in this document were discussed and addressed.  No physical exam could be performed with this format.  Patient verbally consented to a telehealth visit.   Date:  08/07/2019   ID:  Hailey Greene, DOB 04-Apr-1950, MRN [id number]  Patient Location: Home Provider Location: Office  PCP:  Blane Ohara, MD     Chief Complaint:  nausea  History of Present Illness:    Hailey Greene is a 69 y.o. female with nausea Pt states that since about midnight she has had nausea and diarrhea She has not had a fever, no vomiting, and minimal abdominal pain Denies urine symptoms, no chest pain, no shortness of breath No exposure to COVID and has had immunizations She states she has taken 2 doses of zofran and that has not helped nausea and would like to try another medication States her glucose ranges have been good and last checked was 112 Of note she has omeprazole at home but is not currently taking  The patient does not have symptoms concerning for COVID-19 infection (fever, chills, cough, or new shortness of breath).    Past Medical History:  Diagnosis Date  . Acute hypoxemic respiratory failure due to COVID-19 (HCC)   . Chicken pox   . Depression   . Diabetes (HCC)   . GERD (gastroesophageal reflux disease)   . Hypertension   . Idiopathic progressive neuropathy   . Major  depressive disorder   . Mixed hyperlipidemia   . Neuropathy   . Osteoarthritis   . Pneumonia   . Primary insomnia   . RLS (restless legs syndrome)   . Tachycardia   . Urge incontinence   . UTI (lower urinary tract infection)   . Weakness    Past Surgical History:  Procedure Laterality Date  . ABDOMINAL HYSTERECTOMY    . APPENDECTOMY    . CHOLECYSTECTOMY    . HERNIA REPAIR    . REPLACEMENT TOTAL KNEE Right   . TONSILLECTOMY    . TOTAL KNEE ARTHROPLASTY Left 07/15/2019   Procedure: TOTAL KNEE ARTHROPLASTY;  Surgeon: Dannielle Huh, MD;  Location: [facility];  Service: Orthopedics;  Laterality: Left;     No outpatient medications have been marked as taking for the 08/07/19 encounter (Video Visit) with Marianne Sofia, PA-C.     Allergies:   Patient has no known allergies.   Social History   Tobacco Use  . Smoking status: Former Smoker    Packs/day: 2.00    Years: 12.00    Pack years: 24.00    Types: Cigarettes    Quit date: 05/25/2010    Years since quitting: 9.2  . Smokeless tobacco: Never Used  Substance Use Topics  . Alcohol use: Yes    Comment: occasional  . Drug use: No     Family Hx: The patient's family history includes Brain cancer in her father; Cancer in her mother; Diabetes in her daughter; Heart disease in  her maternal grandmother; Migraines in her mother; Thyroid disease in her mother.  ROS:   Please see the history of present illness.    All other systems reviewed and are negative.  Labs/Other Tests and Data Reviewed:    Recent Labs: 01/04/2019: B Natriuretic Peptide 57.9 07/19/2019: TSH 0.885 07/21/2019: ALT 10; BUN 12; Creatinine, Ser 0.70; Hemoglobin 10.6; Magnesium 2.0; Platelets 268; Potassium 3.7; Sodium 134   Recent Lipid Panel Lab Results  Component Value Date/Time   CHOL 151 05/31/2019 11:20 AM   TRIG 220 (H) 05/31/2019 11:20 AM   HDL 45 05/31/2019 11:20 AM   CHOLHDL 3.4 05/31/2019 11:20 AM   LDLCALC 70 05/31/2019 11:20 AM    Wt Readings from  Last 3 Encounters:  07/21/19 213 lb 3 oz (96.7 kg)  07/15/19 203 lb 14.8 oz (92.5 kg)  07/11/19 204 lb (92.5 kg)     Objective:    Vital Signs:  BP (!) 151/81   Pulse (!) 104    VITAL SIGNS:  reviewed  ASSESSMENT & PLAN:    1. Gastroenteritis - recommend clear liquids, bland diet and stop zofran and try phenergan - also to take her prilosec as directed  COVID-19 Education: The signs and symptoms of COVID-19 were discussed with the patient and how to seek care for testing (follow up with PCP or arrange E-visit). The importance of social distancing was discussed today.  Time:   Today, I have spent 10 minutes with the patient with telehealth technology discussing the above problems.     Medication Adjustments/Labs and Tests Ordered: Current medicines are reviewed at length with the patient today.  Concerns regarding medicines are outlined above.   Tests Ordered: No orders of the defined types were placed in this encounter.   Medication Changes: Meds ordered this encounter  Medications  . promethazine (PHENERGAN) 25 MG tablet    Sig: Take 1 tablet (25 mg total) by mouth every 8 (eight) hours as needed for nausea or vomiting. DO NOT USE WITH ZOFRAN    Dispense:  20 tablet    Refill:  1    Order Specific Question:   Supervising Provider    AnswerShelton Silvas    Follow Up:  In Person prn  Signed, Webb Silversmith, PA-C  08/07/2019 11:58 AM    [facility]

## 2019-08-08 ENCOUNTER — Emergency Department (HOSPITAL_COMMUNITY)
Admission: EM | Admit: 2019-08-08 | Discharge: 2019-08-08 | Disposition: A | Discharge status: Home / Self Care | Payer: 59 | Attending: Emergency Medicine | Admitting: Emergency Medicine

## 2019-08-08 ENCOUNTER — Telehealth: Payer: Self-pay

## 2019-08-08 ENCOUNTER — Other Ambulatory Visit: Payer: Self-pay

## 2019-08-08 ENCOUNTER — Ambulatory Visit (INDEPENDENT_AMBULATORY_CARE_PROVIDER_SITE_OTHER): Payer: 59 | Admitting: Family Medicine

## 2019-08-08 ENCOUNTER — Encounter (HOSPITAL_COMMUNITY): Payer: Self-pay | Admitting: Emergency Medicine

## 2019-08-08 DIAGNOSIS — R110 Nausea: Secondary | ICD-10-CM | POA: Diagnosis not present

## 2019-08-08 DIAGNOSIS — R197 Diarrhea, unspecified: Secondary | ICD-10-CM | POA: Diagnosis not present

## 2019-08-08 DIAGNOSIS — Z5321 Procedure and treatment not carried out due to patient leaving prior to being seen by health care provider: Secondary | ICD-10-CM | POA: Diagnosis not present

## 2019-08-08 DIAGNOSIS — E1142 Type 2 diabetes mellitus with diabetic polyneuropathy: Secondary | ICD-10-CM

## 2019-08-08 MED ORDER — SODIUM CHLORIDE 0.9% FLUSH: 3.0000 mL | Freq: Once | INTRAVENOUS | Status: DC

## 2019-08-08 NOTE — ED Triage Notes (Signed)
Pt reports since during the night having diarrhea and nausea. Had knee surgery couple weeks ago

## 2019-08-08 NOTE — Telephone Encounter (Signed)
Mr. Niday called to report that Hailey Greene is not eating or drinking and he feels that she needs to go to the Emergency Department.  She had an appointment with [place] earlier this morning but she failed to come.  Mr. Raffety said the patient did not think that Dr. Sedalia Muta was going to be in the office and she didn't want to come.  He is going to take her to the ED for evaluation and treatment.

## 2019-08-09 ENCOUNTER — Emergency Department (HOSPITAL_COMMUNITY): Payer: 59

## 2019-08-09 ENCOUNTER — Other Ambulatory Visit: Payer: Self-pay

## 2019-08-09 ENCOUNTER — Emergency Department (HOSPITAL_COMMUNITY)
Admission: EM | Admit: 2019-08-09 | Discharge: 2019-08-09 | Disposition: A | Discharge status: Home / Self Care | Payer: 59 | Attending: Emergency Medicine | Admitting: Emergency Medicine

## 2019-08-09 ENCOUNTER — Encounter (HOSPITAL_COMMUNITY): Payer: Self-pay

## 2019-08-09 DIAGNOSIS — I10 Essential (primary) hypertension: Secondary | ICD-10-CM | POA: Insufficient documentation

## 2019-08-09 DIAGNOSIS — R109 Unspecified abdominal pain: Secondary | ICD-10-CM | POA: Insufficient documentation

## 2019-08-09 DIAGNOSIS — Z8616 Personal history of COVID-19: Secondary | ICD-10-CM | POA: Diagnosis not present

## 2019-08-09 DIAGNOSIS — Z96651 Presence of right artificial knee joint: Secondary | ICD-10-CM | POA: Insufficient documentation

## 2019-08-09 DIAGNOSIS — Z96652 Presence of left artificial knee joint: Secondary | ICD-10-CM | POA: Insufficient documentation

## 2019-08-09 DIAGNOSIS — R110 Nausea: Secondary | ICD-10-CM | POA: Diagnosis present

## 2019-08-09 DIAGNOSIS — Z7982 Long term (current) use of aspirin: Secondary | ICD-10-CM | POA: Diagnosis not present

## 2019-08-09 DIAGNOSIS — N3000 Acute cystitis without hematuria: Secondary | ICD-10-CM | POA: Diagnosis not present

## 2019-08-09 DIAGNOSIS — E1140 Type 2 diabetes mellitus with diabetic neuropathy, unspecified: Secondary | ICD-10-CM | POA: Insufficient documentation

## 2019-08-09 DIAGNOSIS — Z87891 Personal history of nicotine dependence: Secondary | ICD-10-CM | POA: Insufficient documentation

## 2019-08-09 DIAGNOSIS — Z794 Long term (current) use of insulin: Secondary | ICD-10-CM | POA: Diagnosis not present

## 2019-08-09 LAB — CBC
HCT: 42.7 % (ref 36.0–46.0)
Hemoglobin: 13.8 g/dL (ref 12.0–15.0)
MCH: 29.6 pg (ref 26.0–34.0)
MCHC: 32.3 g/dL (ref 30.0–36.0)
MCV: 91.4 fL (ref 80.0–100.0)
Platelets: 341 10*3/uL (ref 150–400)
RBC: 4.67 MIL/uL (ref 3.87–5.11)
RDW: 14.9 % (ref 11.5–15.5)
WBC: 10.5 10*3/uL (ref 4.0–10.5)
nRBC: 0 % (ref 0.0–0.2)

## 2019-08-09 LAB — COMPREHENSIVE METABOLIC PANEL
ALT: 14 U/L (ref 0–44)
AST: 17 U/L (ref 15–41)
Albumin: 4.1 g/dL (ref 3.5–5.0)
Alkaline Phosphatase: 106 U/L (ref 38–126)
Anion gap: 10 (ref 5–15)
BUN: 14 mg/dL (ref 8–23)
CO2: 26 mmol/L (ref 22–32)
Calcium: 9.4 mg/dL (ref 8.9–10.3)
Chloride: 103 mmol/L (ref 98–111)
Creatinine, Ser: 0.8 mg/dL (ref 0.44–1.00)
GFR calc Af Amer: >60 mL/min (ref >60)
GFR calc non Af Amer: >60 mL/min (ref >60)
Glucose, Bld: 126 mg/dL — ABNORMAL HIGH (ref 70–99)
Potassium: 3.6 mmol/L (ref 3.5–5.1)
Sodium: 139 mmol/L (ref 135–145)
Total Bilirubin: 1 mg/dL (ref 0.3–1.2)
Total Protein: 7.6 g/dL (ref 6.5–8.1)

## 2019-08-09 LAB — URINALYSIS, ROUTINE W REFLEX MICROSCOPIC
Bilirubin Urine: NEGATIVE
Glucose, UA: NEGATIVE mg/dL
Hgb urine dipstick: NEGATIVE
Ketones, ur: NEGATIVE mg/dL
Nitrite: POSITIVE — AB
Protein, ur: NEGATIVE mg/dL
Specific Gravity, Urine: 1.032 — ABNORMAL HIGH (ref 1.005–1.030)
WBC, UA: >50 WBC/hpf — ABNORMAL HIGH (ref 0–5)
pH: 7 (ref 5.0–8.0)

## 2019-08-09 LAB — LIPASE, BLOOD: Lipase: 19 U/L (ref 11–51)

## 2019-08-09 MED ORDER — ONDANSETRON 4 MG PO TBDP
4.0000 mg | ORAL_TABLET | Freq: Three times a day (TID) | ORAL | 0 refills | Status: DC | PRN
Start: 2019-08-09 — End: 2019-08-13

## 2019-08-09 MED ORDER — IOHEXOL 300 MG/ML  SOLN
100.0000 mL | Freq: Once | INTRAMUSCULAR | Status: AC | PRN
  Administered 2019-08-09: 100 mL via INTRAVENOUS

## 2019-08-09 MED ORDER — SODIUM CHLORIDE 0.9 % IV SOLN: INTRAVENOUS | Status: DC

## 2019-08-09 MED ORDER — CEPHALEXIN 500 MG PO CAPS
500.0000 mg | ORAL_CAPSULE | Freq: Four times a day (QID) | ORAL | 0 refills | Status: DC
Start: 2019-08-09 — End: 2019-09-12

## 2019-08-09 MED ORDER — SODIUM CHLORIDE 0.9 % IV BOLUS
1000.0000 mL | Freq: Once | INTRAVENOUS | Status: AC
  Administered 2019-08-09: 1000 mL via INTRAVENOUS

## 2019-08-09 MED ORDER — SODIUM CHLORIDE 0.9 % IV SOLN: 1.0000 g | Freq: Once | INTRAVENOUS | Status: DC

## 2019-08-09 MED ORDER — ONDANSETRON HCL 4 MG/2ML IJ SOLN
4.0000 mg | Freq: Once | INTRAMUSCULAR | Status: AC
  Administered 2019-08-09: 4 mg via INTRAVENOUS
  Filled 2019-08-09: qty 2

## 2019-08-09 MED ORDER — SODIUM CHLORIDE 0.9% FLUSH
3.0000 mL | Freq: Once | INTRAVENOUS | Status: AC
  Administered 2019-08-09: 3 mL via INTRAVENOUS

## 2019-08-09 NOTE — Discharge Instructions (Addendum)
Work-up here today shows evidence of urinary tract infection.  Take the Keflex antibiotic for the next 7 days.  Take the Zofran as needed for nausea and vomiting.  Urine culture sent.  But follow-up with your regular doctor.  Would expect improvement over the next 2 days if there is not any improvement you need to get seen again.

## 2019-08-09 NOTE — ED Provider Notes (Signed)
[facility]-EMERGENCY DEPT Provider Note   CSN: [id number] Arrival date & time: 08/09/19  0805     History Chief Complaint  Patient presents with  . Abdominal Pain  . Nausea  . Diarrhea    Hailey Greene is a 69 y.o. female.  Patient with a complaint of persistent nausea.  Not able to really eat or drink anything for the past several days.  Patient status post knee surgery still taking tramadol for that.  Primary care doctor did call in a different antinausea medicine.  Patient was on Zofran prior to that.  Patient was here yesterday with the wait was too long so she left.  Patient also with a complaint of mid abdominal pain.  Had some loose bowel movements past 2 days.  But has not had any significant frequency to the loose bowel movements.  No blood in the bowel movements.        Past Medical History:  Diagnosis Date  . Acute hypoxemic respiratory failure due to COVID-19 (HCC)   . Chicken pox   . Depression   . Diabetes (HCC)   . GERD (gastroesophageal reflux disease)   . Hypertension   . Idiopathic progressive neuropathy   . Major depressive disorder   . Mixed hyperlipidemia   . Neuropathy   . Osteoarthritis   . Pneumonia   . Primary insomnia   . RLS (restless legs syndrome)   . Tachycardia   . Urge incontinence   . UTI (lower urinary tract infection)   . Weakness     Patient Active Problem List   Diagnosis Date Noted  . Nausea 08/07/2019  . S/P TKR (total knee replacement) using cement, left 07/25/2019  . Acute encephalopathy 07/19/2019  . Hypokalemia 07/19/2019  . Major depressive disorder   . S/P total knee replacement 07/15/2019  . Diarrhea of presumed infectious origin 06/06/2019  . Class 2 obesity with alveolar hypoventilation, serious comorbidity, and body mass index (BMI) of 37.0 to 37.9 in adult (HCC) 06/02/2019  . Mild intermittent asthma without complication 06/02/2019  . Preoperative cardiovascular examination 05/31/2019    . Primary osteoarthritis of left knee 05/31/2019  . Diabetic polyneuropathy associated with type 2 diabetes mellitus (HCC) 05/31/2019  . Acute non-recurrent maxillary sinusitis 05/08/2019  . 2019 novel coronavirus disease (COVID-19) 12/31/2018  . Benign essential HTN 12/31/2018  . Chronic pain of left knee 12/11/2018  . Incisional hernia 11/03/2016  . Idiopathic progressive neuropathy 04/11/2014    Past Surgical History:  Procedure Laterality Date  . ABDOMINAL HYSTERECTOMY    . APPENDECTOMY    . CHOLECYSTECTOMY    . HERNIA REPAIR    . REPLACEMENT TOTAL KNEE Right   . TONSILLECTOMY    . TOTAL KNEE ARTHROPLASTY Left 07/15/2019   Procedure: TOTAL KNEE ARTHROPLASTY;  Surgeon: Dannielle Huh, MD;  Location: [facility];  Service: Orthopedics;  Laterality: Left;     OB History   No obstetric history on file.     Family History  Problem Relation Age of Onset  . Thyroid disease Mother   . Cancer Mother   . Migraines Mother   . Brain cancer Father   . Heart disease Maternal Grandmother   . Diabetes Daughter     Social History   Tobacco Use  . Smoking status: Former Smoker    Packs/day: 2.00    Years: 12.00    Pack years: 24.00    Types: Cigarettes    Quit date: 05/25/2010    Years since  quitting: 9.2  . Smokeless tobacco: Never Used  Substance Use Topics  . Alcohol use: Yes    Comment: occasional  . Drug use: No    Home Medications Prior to Admission medications   Medication Sig Start Date End Date Taking? Authorizing Provider  acetaminophen (TYLENOL) 500 MG tablet Take 500-1,000 mg by mouth every 6 (six) hours as needed (for pain).   Yes [provider]  amitriptyline (ELAVIL) 50 MG tablet Take 50 mg by mouth at bedtime. 07/08/19  Yes [provider]  aspirin EC 325 MG EC tablet Take 1 tablet (325 mg total) by mouth 2 (two) times daily. 07/16/19  Yes Guy Sandifer, PA  atorvastatin (LIPITOR) 20 MG tablet TAKE ONE (1) TABLET ONCE DAILY Patient taking  differently: Take 20 mg by mouth daily.  06/26/19  Yes Marianne Sofia, PA-C  dicyclomine (BENTYL) 20 MG tablet TAKE ONE TABLET EVERY 6 HOURS AS NEEDED Patient taking differently: Take 10 mg by mouth every 6 (six) hours as needed (stomach problems/abdominal spasms.).  06/27/19  Yes Marianne Sofia, PA-C  DULoxetine (CYMBALTA) 60 MG capsule Take 60 mg by mouth 2 (two) times daily.  06/27/19  Yes [provider]  hydrochlorothiazide (HYDRODIURIL) 12.5 MG tablet Take 1 tablet (12.5 mg total) by mouth daily. 07/21/19  Yes Sheikh, Omair Latif, DO  metFORMIN (GLUCOPHAGE) 500 MG tablet Take 500 mg by mouth daily with breakfast.   Yes [provider]  mirtazapine (REMERON) 15 MG tablet TAKE ONE TABLET BY MOUTH AT BEDTIME Patient taking differently: Take 15 mg by mouth at bedtime.  08/02/19  Yes Cox, Kirsten, MD  Multiple Vitamin (MULTIVITAMIN WITH MINERALS) TABS tablet Take 1 tablet by mouth daily.   Yes [provider]  omeprazole (PRILOSEC) 20 MG capsule TAKE ONE CAPSULE BY MOUTH DAILY BEFORE AMEAL Patient taking differently: Take 20 mg by mouth daily.  06/11/19  Yes Marianne Sofia, PA-C  ondansetron (ZOFRAN) 4 MG tablet TAKE ONE TABLET BY MOUTH EVERY EIGHT HOURS AS NEEDED NAUSEA Patient taking differently: Take 4 mg by mouth every 8 (eight) hours as needed for nausea.  06/06/19  Yes Cox, Kirsten, MD  pregabalin (LYRICA) 200 MG capsule TAKE ONE (1) CAPSULE THREE (3) TIMES EACH DAY Patient taking differently: Take 200 mg by mouth in the morning, at noon, and at bedtime.  06/27/19  Yes Marianne Sofia, PA-C  promethazine (PHENERGAN) 25 MG tablet Take 1 tablet (25 mg total) by mouth every 8 (eight) hours as needed for nausea or vomiting. DO NOT USE WITH ZOFRAN 08/07/19  Yes Marianne Sofia, PA-C  rOPINIRole (REQUIP) 3 MG tablet Take 3 mg by mouth at bedtime. 06/27/19  Yes [provider]  senna-docusate (SENOKOT-S) 8.6-50 MG tablet Take 1 tablet by mouth at bedtime as needed for mild constipation. 07/21/19   Yes Sheikh, Omair Latif, DO  solifenacin (VESICARE) 5 MG tablet TAKE ONE (1) TABLET ONCE DAILY Patient taking differently: Take 5 mg by mouth daily.  06/27/19  Yes Marianne Sofia, PA-C  SYMBICORT 160-4.5 MCG/ACT inhaler Inhale 2 puffs into the lungs 2 times daily at 12 noon and 4 pm. 05/31/19  Yes Cox, Kirsten, MD  traMADol (ULTRAM) 50 MG tablet Take 50-100 mg by mouth every 6 (six) hours as needed (pain).   Yes [provider]  VICTOZA 18 MG/3ML SOPN INJECT 1.2 MG ONCE DAILY Patient taking differently: Inject 1.6 mg into the skin daily.  06/13/19  Yes Cox, Kirsten, MD  vitamin B-12 1000 MCG tablet Take 1 tablet (  1,000 mcg total) by mouth daily. 07/22/19  Yes Sheikh, Omair Latif, DO  glucose blood (ONETOUCH VERIO) test strip  09/11/18   [provider]  Lancets [facility] DELICA PLUS [place]) MISC  09/11/18   [provider]  TRINTELLIX 10 MG TABS tablet TAKE 1 TABLET BY MOUTH ONCE DAILY AT [facility] TIME [facility] DAY Patient not taking: No sig reported 07/08/19   Blane Ohara, MD    Allergies    Patient has no known allergies.  Review of Systems   Review of Systems  Constitutional: Negative for chills and fever.  HENT: Negative for congestion, rhinorrhea and sore throat.   Eyes: Negative for visual disturbance.  Respiratory: Negative for cough and shortness of breath.   Cardiovascular: Negative for chest pain and leg swelling.  Gastrointestinal: Positive for abdominal pain, diarrhea and nausea. Negative for vomiting.  Genitourinary: Negative for dysuria.  Musculoskeletal: Negative for back pain and neck pain.  Skin: Negative for rash.  Neurological: Negative for dizziness, light-headedness and headaches.  Hematological: Does not bruise/bleed easily.  Psychiatric/Behavioral: Negative for confusion.    Physical Exam Updated Vital Signs BP (!) 162/75   Pulse 92   Temp 98.3 F (36.8 C) (Oral)   Resp 16   Ht 1.6 m (5\' 3" )   Wt 96.6 kg   SpO2 97%   BMI 37.73 kg/m    Physical Exam Vitals and nursing note reviewed.  Constitutional:      General: She is not in acute distress.    Appearance: Normal appearance. She is well-developed.  HENT:     Head: Normocephalic and atraumatic.     Mouth/Throat:     Mouth: Mucous membranes are dry.  Eyes:     Conjunctiva/sclera: Conjunctivae normal.  Cardiovascular:     Rate and Rhythm: Normal rate and regular rhythm.     Heart sounds: No murmur.  Pulmonary:     Effort: Pulmonary effort is normal. No respiratory distress.     Breath sounds: Normal breath sounds.  Abdominal:     Palpations: Abdomen is soft.     Tenderness: There is no abdominal tenderness. There is no guarding.  Musculoskeletal:        General: Normal range of motion.     Cervical back: Neck supple.  Skin:    General: Skin is warm and dry.  Neurological:     General: No focal deficit present.     Mental Status: She is alert. Mental status is at baseline.     Coordination: Abnormal coordination:      ED Results / Procedures / Treatments   Labs (all labs ordered are listed, but only abnormal results are displayed) Labs Reviewed  COMPREHENSIVE METABOLIC PANEL - Abnormal; Notable for the following components:      Result Value   Glucose, Bld 126 (*)    All other components within normal limits  LIPASE, BLOOD  CBC  URINALYSIS, ROUTINE W REFLEX MICROSCOPIC    EKG None  Radiology No results found.  Procedures Procedures (including critical care time)  Medications Ordered in ED Medications  0.9 %  sodium chloride infusion ( Intravenous New Bag/Given 08/09/19 0920)  sodium chloride flush (NS) 0.9 % injection 3 mL (3 mLs Intravenous Given 08/09/19 0841)  ondansetron (ZOFRAN) injection 4 mg (4 mg Intravenous Given 08/09/19 0919)  sodium chloride 0.9 % bolus 1,000 mL (1,000 mLs Intravenous Bolus from Bag 08/09/19 0919)    ED Course  I have reviewed the triage vital signs and the nursing notes.  Pertinent labs & imaging results  that were available during my care of the patient were reviewed by me and considered in my medical decision making (see chart for details).    MDM Rules/Calculators/A&P                      Patient's work-up here CT scan abdomen without any acute findings.  Labs without any significant abnormalities.  Urinalysis definitely consistent with urinary tract infection.  Urine sent for culture.  Wanted to give a dose of IV Rocephin here.  The patient just wanted oral medication.  So patient will take Keflex for the next 7 days follow-up with her primary care doctor.  Also added on backup prescription for dissolvable Zofran.  Patient will follow up with primary care doctor.  Nontoxic no acute distress here.  Did recommend that the patient does not need to take the tramadol this may be causing some of the nausea that would be best to stop it.    Final Clinical Impression(s) / ED Diagnoses Final diagnoses:  None    Rx / DC Orders ED Discharge Orders    None       Fredia Sorrow, MD 08/09/19 1355

## 2019-08-09 NOTE — ED Triage Notes (Signed)
Patient c/o mid abdominal pain, nausea, and diarrhea x 2 days. Patient states she has been feeling "foggy" when she wakes up in the middle of the might.

## 2019-08-13 ENCOUNTER — Ambulatory Visit (INDEPENDENT_AMBULATORY_CARE_PROVIDER_SITE_OTHER): Payer: 59 | Admitting: Family Medicine

## 2019-08-13 ENCOUNTER — Encounter: Payer: Self-pay | Admitting: Family Medicine

## 2019-08-13 ENCOUNTER — Other Ambulatory Visit: Payer: Self-pay

## 2019-08-13 VITALS — BP 128/64 | HR 116 | Temp 96.0°F | Resp 18 | Ht 67.0 in | Wt 207.0 lb

## 2019-08-13 DIAGNOSIS — E6609 Other obesity due to excess calories: Secondary | ICD-10-CM | POA: Diagnosis not present

## 2019-08-13 DIAGNOSIS — N3000 Acute cystitis without hematuria: Secondary | ICD-10-CM | POA: Diagnosis not present

## 2019-08-13 DIAGNOSIS — Z6832 Body mass index (BMI) 32.0-32.9, adult: Secondary | ICD-10-CM

## 2019-08-13 DIAGNOSIS — F332 Major depressive disorder, recurrent severe without psychotic features: Secondary | ICD-10-CM | POA: Diagnosis not present

## 2019-08-13 LAB — POCT URINALYSIS DIPSTICK
Bilirubin, UA: NEGATIVE
Blood, UA: NEGATIVE
Glucose, UA: NEGATIVE
Ketones, UA: POSITIVE
Leukocytes, UA: NEGATIVE
Nitrite, UA: NEGATIVE
Protein, UA: POSITIVE — AB
Spec Grav, UA: 1.02 (ref 1.010–1.025)
Urobilinogen, UA: 0.2 E.U./dL
pH, UA: 6 (ref 5.0–8.0)

## 2019-08-13 MED ORDER — DESVENLAFAXINE SUCCINATE ER 50 MG PO TB24: 50.0000 mg | ORAL_TABLET | Freq: Every day | ORAL | 2 refills | Status: DC

## 2019-08-13 NOTE — Progress Notes (Signed)
Established Patient Office Visit  Subjective:  Patient ID: Hailey Greene, female    DOB: October 22, 1950  Age: 69 y.o. MRN: [id number]  CC:  Chief Complaint  Patient presents with  . Follow-up    HPI Hailey Greene presents for follow up UTI. Patient went to ED due to persistent nausea. She was not able to eat or drink anything. She also had mid abdominal pain. patient was given keflex at ED.   Patient is very depressed. She is discouraged over not feeling well. Patient is not actively suicidal, but wishes she was not here often. She has failed on duloxetine and trintellix. She had been on ssris previously also without much success.   Obesity with BMI 32. She is particularly discouraged about her weight and is requesting a diet medication. She tries to eat healthy. She is unable to exercise due to her orthopedic issues.  PHQ9 SCORE ONLY 08/26/2019  PHQ-9 Total Score 21    Past Medical History:  Diagnosis Date  . Acute hypoxemic respiratory failure due to COVID-19 (HCC)   . Chicken pox   . Depression   . Diabetes (HCC)   . GERD (gastroesophageal reflux disease)   . Hypertension   . Idiopathic progressive neuropathy   . Major depressive disorder   . Mixed hyperlipidemia   . Neuropathy   . Osteoarthritis   . Pneumonia   . Primary insomnia   . RLS (restless legs syndrome)   . Tachycardia   . Urge incontinence   . UTI (lower urinary tract infection)   . Weakness     Past Surgical History:  Procedure Laterality Date  . ABDOMINAL HYSTERECTOMY    . APPENDECTOMY    . CHOLECYSTECTOMY    . HERNIA REPAIR    . REPLACEMENT TOTAL KNEE Right   . TONSILLECTOMY    . TOTAL KNEE ARTHROPLASTY Left 07/15/2019   Procedure: TOTAL KNEE ARTHROPLASTY;  Surgeon: Dannielle Huh, MD;  Location: [facility];  Service: Orthopedics;  Laterality: Left;    Family History  Problem Relation Age of Onset  . Thyroid disease Mother   . Cancer Mother   . Migraines Mother   . Brain cancer Father   .  Heart disease Maternal Grandmother   . Diabetes Daughter     Social History   Socioeconomic History  . Marital status: Married    Spouse name: Not on file  . Number of children: 3  . Years of education: 49  . Highest education level: Not on file  Occupational History  . Occupation: Housewife  Tobacco Use  . Smoking status: Former Smoker    Packs/day: 2.00    Years: 12.00    Pack years: 24.00    Types: Cigarettes    Quit date: 05/25/2010    Years since quitting: 9.2  . Smokeless tobacco: Never Used  Substance and Sexual Activity  . Alcohol use: Yes    Comment: occasional  . Drug use: No  . Sexual activity: Not on file  Other Topics Concern  . Not on file  Social History Narrative   Born and raised in [place], [place]. Currently lives in a house with her husband. 1 dog. Fun: Garden, feed birds, swimming   Denies any religious beliefs effecting health care.    Social Determinants of Health   Financial Resource Strain:   . Difficulty of Paying Living Expenses:   Food Insecurity:   . Worried About Programme researcher, broadcasting/film/video in the Last Year:   . Ran  Out of Food in the Last Year:   Transportation Needs:   . Lack of Transportation (Medical):   Marland Kitchen Lack of Transportation (Non-Medical):   Physical Activity:   . Days of Exercise per Week:   . Minutes of Exercise per Session:   Stress:   . Feeling of Stress :   Social Connections:   . Frequency of Communication with Friends and Family:   . Frequency of Social Gatherings with Friends and Family:   . Attends Religious Services:   . Active Member of Clubs or Organizations:   . Attends Banker Meetings:   Marland Kitchen Marital Status:   Intimate Partner Violence:   . Fear of Current or Ex-Partner:   . Emotionally Abused:   Marland Kitchen Physically Abused:   . Sexually Abused:     Outpatient Medications Prior to Visit  Medication Sig Dispense Refill  . acetaminophen (TYLENOL) 500 MG tablet Take 500-1,000 mg by mouth every 6 (six) hours as  needed (for pain).    Marland Kitchen amitriptyline (ELAVIL) 50 MG tablet Take 50 mg by mouth at bedtime.    Marland Kitchen aspirin EC 325 MG EC tablet Take 1 tablet (325 mg total) by mouth 2 (two) times daily. 30 tablet 0  . atorvastatin (LIPITOR) 20 MG tablet TAKE ONE (1) TABLET ONCE DAILY (Patient taking differently: Take 20 mg by mouth daily. ) 90 tablet 0  . cephALEXin (KEFLEX) 500 MG capsule Take 1 capsule (500 mg total) by mouth 4 (four) times daily. 28 capsule 0  . dicyclomine (BENTYL) 20 MG tablet TAKE ONE TABLET EVERY 6 HOURS AS NEEDED (Patient taking differently: Take 10 mg by mouth every 6 (six) hours as needed (stomach problems/abdominal spasms.). ) 360 tablet 0  . DULoxetine (CYMBALTA) 60 MG capsule Take 60 mg by mouth 2 (two) times daily.     Marland Kitchen glucose blood (ONETOUCH VERIO) test strip     . hydrochlorothiazide (HYDRODIURIL) 12.5 MG tablet Take 1 tablet (12.5 mg total) by mouth daily. 30 tablet 0  . Lancets (ONETOUCH DELICA PLUS LANCET33G) MISC     . metFORMIN (GLUCOPHAGE) 500 MG tablet Take 500 mg by mouth daily with breakfast.    . mirtazapine (REMERON) 15 MG tablet TAKE ONE TABLET BY MOUTH AT BEDTIME (Patient taking differently: Take 15 mg by mouth at bedtime. ) 90 tablet 0  . Multiple Vitamin (MULTIVITAMIN WITH MINERALS) TABS tablet Take 1 tablet by mouth daily.    Marland Kitchen omeprazole (PRILOSEC) 20 MG capsule TAKE ONE CAPSULE BY MOUTH DAILY BEFORE Hailey (Patient taking differently: Take 20 mg by mouth daily. ) 90 capsule 1  . ondansetron (ZOFRAN) 4 MG tablet TAKE ONE TABLET BY MOUTH EVERY EIGHT HOURS AS NEEDED NAUSEA (Patient taking differently: Take 4 mg by mouth every 8 (eight) hours as needed for nausea. ) 90 tablet 0  . pregabalin (LYRICA) 200 MG capsule TAKE ONE (1) CAPSULE THREE (3) TIMES EACH DAY (Patient taking differently: Take 200 mg by mouth in the morning, at noon, and at bedtime. ) 270 capsule 0  . rOPINIRole (REQUIP) 3 MG tablet Take 3 mg by mouth at bedtime.    . senna-docusate (SENOKOT-S) 8.6-50 MG  tablet Take 1 tablet by mouth at bedtime as needed for mild constipation. 30 tablet 0  . solifenacin (VESICARE) 5 MG tablet TAKE ONE (1) TABLET ONCE DAILY (Patient taking differently: Take 5 mg by mouth daily. ) 30 tablet 2  . SYMBICORT 160-4.5 MCG/ACT inhaler Inhale 2 puffs into the lungs 2 times daily  at 12 noon and 4 pm. 1 Inhaler 3  . traMADol (ULTRAM) 50 MG tablet Take 50-100 mg by mouth every 6 (six) hours as needed (pain).    . TRINTELLIX 10 MG TABS tablet TAKE 1 TABLET BY MOUTH ONCE DAILY AT [facility] TIME EACH DAY 90 tablet 0  . VICTOZA 18 MG/3ML SOPN INJECT 1.2 MG ONCE DAILY (Patient taking differently: Inject 1.6 mg into the skin daily. ) 3 pen 4  . vitamin B-12 1000 MCG tablet Take 1 tablet (1,000 mcg total) by mouth daily. 30 tablet 0  . ondansetron (ZOFRAN ODT) 4 MG disintegrating tablet Take 1 tablet (4 mg total) by mouth every 8 (eight) hours as needed for nausea or vomiting. 20 tablet 0  . promethazine (PHENERGAN) 25 MG tablet Take 1 tablet (25 mg total) by mouth every 8 (eight) hours as needed for nausea or vomiting. DO NOT USE WITH ZOFRAN 20 tablet 1   No facility-administered medications prior to visit.    No Known Allergies  ROS Review of Systems  Constitutional: Positive for fatigue. Negative for chills and fever.  HENT: Negative for congestion and sore throat.   Respiratory: Positive for shortness of breath. Negative for cough.   Cardiovascular: Negative for chest pain and palpitations.  Gastrointestinal: Positive for nausea. Negative for abdominal pain and diarrhea.  Genitourinary: Positive for dysuria and frequency.  Musculoskeletal: Positive for arthralgias.  Psychiatric/Behavioral: Positive for dysphoric mood. The patient is nervous/anxious. The patient is not hyperactive.       Objective:    Physical Exam  Constitutional: She appears well-developed and well-nourished.  Neck:  Negative for carotid bruits.   Cardiovascular: Normal rate, regular rhythm and  normal heart sounds.  Pulmonary/Chest: Effort normal and breath sounds normal.  Abdominal: Soft. Bowel sounds are normal. There is no abdominal tenderness.  Psychiatric: She has a normal mood and affect. Her behavior is normal.    BP 128/64   Pulse (!) 116   Temp (!) 96 F (35.6 C)   Resp 18   Ht 5\' 7"  (1.702 m)   Wt 207 lb (93.9 kg)   BMI 32.42 kg/m  Wt Readings from Last 3 Encounters:  08/13/19 207 lb (93.9 kg)  08/09/19 213 lb (96.6 kg)  07/21/19 213 lb 3 oz (96.7 kg)     Health Maintenance Due  Topic Date Due  . Hepatitis C Screening  Never done  . FOOT EXAM  Never done  . OPHTHALMOLOGY EXAM  Never done  . COLONOSCOPY  Never done  . PNA vac Low Risk Adult (1 of 2 - PCV13) Never done    There are no preventive care reminders to display for this patient.  Lab Results  Component Value Date   TSH 0.885 07/19/2019   Lab Results  Component Value Date   WBC 10.5 08/09/2019   HGB 13.8 08/09/2019   HCT 42.7 08/09/2019   MCV 91.4 08/09/2019   PLT 341 08/09/2019   Lab Results  Component Value Date   NA 139 08/09/2019   K 3.6 08/09/2019   CO2 26 08/09/2019   GLUCOSE 126 (H) 08/09/2019   BUN 14 08/09/2019   CREATININE 0.80 08/09/2019   BILITOT 1.0 08/09/2019   ALKPHOS 106 08/09/2019   AST 17 08/09/2019   ALT 14 08/09/2019   PROT 7.6 08/09/2019   ALBUMIN 4.1 08/09/2019   CALCIUM 9.4 08/09/2019   ANIONGAP 10 08/09/2019   Lab Results  Component Value Date   CHOL 151 05/31/2019   Lab  Results  Component Value Date   HDL 45 05/31/2019   Lab Results  Component Value Date   LDLCALC 70 05/31/2019   Lab Results  Component Value Date   TRIG 220 (H) 05/31/2019   Lab Results  Component Value Date   CHOLHDL 3.4 05/31/2019   Lab Results  Component Value Date   HGBA1C 5.6 05/31/2019      Assessment & Plan:   1. UTI - complete antibiotics. - POCT urinalysis dipstick  2. Depression, severe, recurrent Start on pristiq 50 mg once daily. Stop  trintellix.  Contracted for safety.  3. Class 1 obesity due to excess calories with serious comorbidity and body mass index (BMI) of 32.0 to 32.9 in adult Recommend continue to work on eating healthy diet and exercise.  Meds ordered this encounter  Medications  . desvenlafaxine (PRISTIQ) 50 MG 24 hr tablet    Sig: Take 1 tablet (50 mg total) by mouth daily.    Dispense:  30 tablet    Refill:  2    Follow-up: Return in about 4 weeks (around 09/10/2019) for fasting.    Rochel Brome, MD

## 2019-08-14 ENCOUNTER — Inpatient Hospital Stay: Payer: 59 | Admitting: Family Medicine

## 2019-08-15 ENCOUNTER — Other Ambulatory Visit: Payer: Self-pay | Admitting: Family Medicine

## 2019-08-19 ENCOUNTER — Other Ambulatory Visit: Payer: Self-pay

## 2019-08-19 ENCOUNTER — Telehealth: Payer: Self-pay

## 2019-08-19 MED ORDER — PHENTERMINE HCL 37.5 MG PO TABS: 37.5000 mg | ORAL_TABLET | Freq: Every morning | ORAL | 0 refills | Status: DC

## 2019-08-19 NOTE — Telephone Encounter (Signed)
Received a fax from patients pharmacy stating they were out of stock with Adipex. (this has already been addressed)   While speaking with patient she asked that "since she recently had knee surgery, should she still be falling as much." I informed the patient that she should not be falling at all. She informed me she has PT tomorrow, I asked if she has addressed this issue with them? She says she has not told them, so I advised her to make them aware. She reports falling last Wednesday and today. Denies having hit her head or having any injuries. I did tell her that I would inform you and she also stated that she would move her appt (09/12/2019 @ 10:15) up if she was to fall again to be re-evaluated.

## 2019-08-19 NOTE — Telephone Encounter (Signed)
Agree. Kc  

## 2019-08-29 ENCOUNTER — Encounter: Payer: Self-pay | Admitting: Family Medicine

## 2019-09-12 ENCOUNTER — Ambulatory Visit (INDEPENDENT_AMBULATORY_CARE_PROVIDER_SITE_OTHER): Payer: 59 | Admitting: Family Medicine

## 2019-09-12 ENCOUNTER — Encounter: Payer: Self-pay | Admitting: Family Medicine

## 2019-09-12 ENCOUNTER — Other Ambulatory Visit: Payer: Self-pay

## 2019-09-12 VITALS — BP 114/60 | HR 88 | Temp 96.2°F | Ht 67.0 in | Wt 213.2 lb

## 2019-09-12 DIAGNOSIS — E782 Mixed hyperlipidemia: Secondary | ICD-10-CM

## 2019-09-12 DIAGNOSIS — E6609 Other obesity due to excess calories: Secondary | ICD-10-CM

## 2019-09-12 DIAGNOSIS — F5101 Primary insomnia: Secondary | ICD-10-CM | POA: Insufficient documentation

## 2019-09-12 DIAGNOSIS — F330 Major depressive disorder, recurrent, mild: Secondary | ICD-10-CM | POA: Insufficient documentation

## 2019-09-12 DIAGNOSIS — G603 Idiopathic progressive neuropathy: Secondary | ICD-10-CM

## 2019-09-12 DIAGNOSIS — I10 Essential (primary) hypertension: Secondary | ICD-10-CM | POA: Diagnosis not present

## 2019-09-12 DIAGNOSIS — Z96659 Presence of unspecified artificial knee joint: Secondary | ICD-10-CM

## 2019-09-12 DIAGNOSIS — J4520 Mild intermittent asthma, uncomplicated: Secondary | ICD-10-CM

## 2019-09-12 DIAGNOSIS — E1142 Type 2 diabetes mellitus with diabetic polyneuropathy: Secondary | ICD-10-CM | POA: Diagnosis not present

## 2019-09-12 DIAGNOSIS — E66811 Obesity, class 1: Secondary | ICD-10-CM

## 2019-09-12 DIAGNOSIS — Z6833 Body mass index (BMI) 33.0-33.9, adult: Secondary | ICD-10-CM

## 2019-09-12 MED ORDER — TIZANIDINE HCL 4 MG PO TABS: 4.0000 mg | ORAL_TABLET | Freq: Every day | ORAL | 2 refills | Status: DC

## 2019-09-12 MED ORDER — PHENTERMINE HCL 37.5 MG PO TABS: 37.5000 mg | ORAL_TABLET | Freq: Every morning | ORAL | 2 refills | Status: DC

## 2019-09-12 MED ORDER — ONDANSETRON HCL 4 MG PO TABS: ORAL_TABLET | ORAL | 1 refills | Status: DC

## 2019-09-12 NOTE — Progress Notes (Signed)
Established Patient Office Visit  Subjective:  Patient ID: Hailey Greene, female    DOB: December 13, 1950  Age: 69 y.o. MRN: [id number]  CC:  Chief Complaint  Patient presents with  . Diabetes  . Hyperlipidemia    HPI Hailey Greene presents for follow up of diabetes, hyperlipidemia.  Diabetes: The patient presents with history of (type 2) diabetes mellitus with neuropathy. The patient maintains a diabetic diet and is exercising at physical therapy.  She is keeping a glucose diary. Sugars runs 116-118. She is compliant with taking metformin 500 mg once daily, lyrica 200 mg one three times a day, and amitritypline. Patient specifically denies associated symptoms, including fatigue, polyphagia polyuria, and polyphagia. Patient denies hypoglycemia. In regard to preventative care, the patient performs foot self-exams daily and next ophthalmology exam is in 11/2019.  Mixed hyperlipidemia: Pt presents with hyperlipidemia.  Compliance with treatment has been good; The patient is compliant with medications (lipitor) maintains a low cholesterol diet , follows up as directed , and maintains an exercise regimen . The patient denies experiencing any hypercholesterolemia related symptoms.   Asthma: Patient using symbicort. Denies dyspnea. She has some cough due to allergies.  Obesity with BMI 33. Eating healthy. Taking phentermine for 3 months. She has gained weight since her last visit.   GERD: Taking omeprazole which helps.   Urge incontinence: Vesicare helps.   Hypertension: Currently on hctz 25 mg once daily.   Depression: Currently, pt is doing well on trintellix. She is on mirtazepine for insomnia, as well as amitriptyline.   Past Medical History:  Diagnosis Date  . Acute hypoxemic respiratory failure due to COVID-19 (HCC)   . Chicken pox   . Depression   . Diabetes (HCC)   . GERD (gastroesophageal reflux disease)   . Hypertension   . Idiopathic progressive neuropathy   . Major  depressive disorder   . Mixed hyperlipidemia   . Neuropathy   . Osteoarthritis   . Pneumonia   . Primary insomnia   . RLS (restless legs syndrome)   . Tachycardia   . Urge incontinence   . UTI (lower urinary tract infection)   . Weakness     Past Surgical History:  Procedure Laterality Date  . ABDOMINAL HYSTERECTOMY    . APPENDECTOMY    . CHOLECYSTECTOMY    . HERNIA REPAIR    . REPLACEMENT TOTAL KNEE Right   . TONSILLECTOMY    . TOTAL KNEE ARTHROPLASTY Left 07/15/2019   Procedure: TOTAL KNEE ARTHROPLASTY;  Surgeon: Dannielle Huh, MD;  Location: [facility];  Service: Orthopedics;  Laterality: Left;    Family History  Problem Relation Age of Onset  . Thyroid disease Mother   . Cancer Mother   . Migraines Mother   . Brain cancer Father   . Heart disease Maternal Grandmother   . Diabetes Daughter     Social History   Socioeconomic History  . Marital status: Married    Spouse name: Not on file  . Number of children: 3  . Years of education: 32  . Highest education level: Not on file  Occupational History  . Occupation: Housewife  Tobacco Use  . Smoking status: Former Smoker    Packs/day: 2.00    Years: 12.00    Pack years: 24.00    Types: Cigarettes    Quit date: 05/25/2010    Years since quitting: 9.3  . Smokeless tobacco: Never Used  Vaping Use  . Vaping Use: Never used  Substance and  Sexual Activity  . Alcohol use: Yes    Comment: occasional  . Drug use: No  . Sexual activity: Not on file  Other Topics Concern  . Not on file  Social History Narrative   Born and raised in [place], [place]. Currently lives in a house with her husband. 1 dog. Fun: Garden, feed birds, swimming   Denies any religious beliefs effecting health care.    Social Determinants of Health   Financial Resource Strain:   . Difficulty of Paying Living Expenses:   Food Insecurity:   . Worried About Charity fundraiser in the Last Year:   . Arboriculturist in the Last Year:   Transportation  Needs:   . Film/video editor (Medical):   Marland Kitchen Lack of Transportation (Non-Medical):   Physical Activity:   . Days of Exercise per Week:   . Minutes of Exercise per Session:   Stress:   . Feeling of Stress :   Social Connections:   . Frequency of Communication with Friends and Family:   . Frequency of Social Gatherings with Friends and Family:   . Attends Religious Services:   . Active Member of Clubs or Organizations:   . Attends Archivist Meetings:   Marland Kitchen Marital Status:   Intimate Partner Violence:   . Fear of Current or Ex-Partner:   . Emotionally Abused:   Marland Kitchen Physically Abused:   . Sexually Abused:     Outpatient Medications Prior to Visit  Medication Sig Dispense Refill  . acetaminophen (TYLENOL) 500 MG tablet Take 500-1,000 mg by mouth every 6 (six) hours as needed (for pain).    Marland Kitchen aspirin EC 325 MG EC tablet Take 1 tablet (325 mg total) by mouth 2 (two) times daily. 30 tablet 0  . atorvastatin (LIPITOR) 20 MG tablet TAKE ONE (1) TABLET ONCE DAILY (Patient taking differently: Take 20 mg by mouth daily. ) 90 tablet 0  . dicyclomine (BENTYL) 20 MG tablet TAKE ONE TABLET EVERY 6 HOURS AS NEEDED (Patient taking differently: Take 10 mg by mouth every 6 (six) hours as needed (stomach problems/abdominal spasms.). ) 360 tablet 0  . glucose blood (ONETOUCH VERIO) test strip     . hydrochlorothiazide (HYDRODIURIL) 12.5 MG tablet Take 1 tablet (12.5 mg total) by mouth daily. 30 tablet 0  . Lancets (ONETOUCH DELICA PLUS [id number]) MISC     . metFORMIN (GLUCOPHAGE) 500 MG tablet Take 500 mg by mouth daily with breakfast.    . Multiple Vitamin (MULTIVITAMIN WITH MINERALS) TABS tablet Take 1 tablet by mouth daily.    Marland Kitchen omeprazole (PRILOSEC) 20 MG capsule TAKE ONE CAPSULE BY MOUTH DAILY BEFORE AMEAL (Patient taking differently: Take 20 mg by mouth daily. ) 90 capsule 1  . pregabalin (LYRICA) 200 MG capsule TAKE ONE (1) CAPSULE THREE (3) TIMES EACH DAY (Patient taking differently:  Take 200 mg by mouth in the morning, at noon, and at bedtime. ) 270 capsule 0  . rOPINIRole (REQUIP) 3 MG tablet Take 3 mg by mouth at bedtime.    . senna-docusate (SENOKOT-S) 8.6-50 MG tablet Take 1 tablet by mouth at bedtime as needed for mild constipation. 30 tablet 0  . solifenacin (VESICARE) 5 MG tablet TAKE ONE (1) TABLET ONCE DAILY (Patient taking differently: Take 5 mg by mouth daily. ) 30 tablet 2  . SYMBICORT 160-4.5 MCG/ACT inhaler Inhale 2 puffs into the lungs 2 times daily at 12 noon and 4 pm. 1 Inhaler 3  . traMADol (ULTRAM)  50 MG tablet Take 50-100 mg by mouth every 6 (six) hours as needed (pain).    . TRINTELLIX 10 MG TABS tablet TAKE 1 TABLET BY MOUTH [facility]CE [facility] 90 tablet 0  . VICTOZA 18 MG/3ML SOPN INJECT 1.2 MG ONCE DAILY (Patient taking differently: Inject 1.6 mg into the skin daily. ) 3 pen 4  . vitamin B-12 1000 MCG tablet Take 1 tablet (1,000 mcg total) by mouth daily. 30 tablet 0  . amitriptyline (ELAVIL) 50 MG tablet Take 50 mg by mouth at bedtime.    . cephALEXin (KEFLEX) 500 MG capsule Take 1 capsule (500 mg total) by mouth 4 (four) times daily. 28 capsule 0  . desvenlafaxine (PRISTIQ) 50 MG 24 hr tablet Take 1 tablet (50 mg total) by mouth daily. 30 tablet 2  . mirtazapine (REMERON) 15 MG tablet TAKE ONE TABLET BY MOUTH AT BEDTIME (Patient taking differently: Take 15 mg by mouth at bedtime. ) 90 tablet 0  . ondansetron (ZOFRAN) 4 MG tablet TAKE ONE TABLET BY MOUTH EVERY EIGHT HOURS AS NEEDED NAUSEA (Patient taking differently: Take 4 mg by mouth every 8 (eight) hours as needed for nausea. ) 90 tablet 0  . phentermine (ADIPEX-P) 37.5 MG tablet Take 1 tablet (37.5 mg total) by mouth every morning. 30 tablet 0   No facility-administered medications prior to visit.    No Known Allergies  ROS Review of Systems  Constitutional: Positive for fatigue. Negative for chills and fever.  HENT: Negative for congestion, ear pain, rhinorrhea and sore  throat.   Respiratory: Positive for cough. Negative for shortness of breath.   Cardiovascular: Negative for chest pain and palpitations.  Gastrointestinal: Negative for abdominal pain, constipation, diarrhea, nausea and vomiting.  Endocrine: Positive for polydipsia. Negative for polyphagia.  Genitourinary: Negative for dysuria, frequency and hematuria.  Musculoskeletal: Negative for arthralgias, back pain and myalgias.  Neurological: Positive for weakness (Patient fell 4 x on June 1st. She describes it as her left knee went out on her and she slumped down. She had no LOC. or injuries.). Negative for headaches.  Psychiatric/Behavioral: Negative for dysphoric mood. The patient is not nervous/anxious.       Objective:    Physical Exam Constitutional:      Appearance: She is obese.  Cardiovascular:     Rate and Rhythm: Normal rate and regular rhythm.     Heart sounds: Normal heart sounds. No murmur heard.  No gallop.   Pulmonary:     Breath sounds: Normal breath sounds.  Musculoskeletal:     Comments: Walking very well using a walker. S/P Left TKR.  Neurological:     Mental Status: She is alert.  Psychiatric:        Mood and Affect: Mood normal.    Diabetic Foot Exam - Simple   Simple Foot Form Visual Inspection No deformities, no ulcerations, no other skin breakdown bilaterally: Yes Sensation Testing See comments: Yes Pulse Check Posterior Tibialis and Dorsalis pulse intact bilaterally: Yes Comments Numbness on toes and under foot about 1/2 way proximal.     BP 114/60   Pulse 88   Temp (!) 96.2 F (35.7 C)   Ht 5\' 7"  (1.702 m)   Wt 213 lb 3.2 oz (96.7 kg)   BMI 33.39 kg/m  Wt Readings from Last 3 Encounters:  09/12/19 213 lb 3.2 oz (96.7 kg)  08/13/19 207 lb (93.9 kg)  08/09/19 213 lb (96.6 kg)     Health Maintenance Due  Topic Date Due  . Hepatitis C Screening  Never done  . FOOT EXAM  Never done  . OPHTHALMOLOGY EXAM  Never done  . COLONOSCOPY  Never  done  . PNA vac Low Risk Adult (1 of 2 - PCV13) Never done    There are no preventive care reminders to display for this patient.  Lab Results  Component Value Date   TSH 0.885 07/19/2019   Lab Results  Component Value Date   WBC 10.5 08/09/2019   HGB 13.8 08/09/2019   HCT 42.7 08/09/2019   MCV 91.4 08/09/2019   PLT 341 08/09/2019   Lab Results  Component Value Date   NA 139 08/09/2019   K 3.6 08/09/2019   CO2 26 08/09/2019   GLUCOSE 126 (H) 08/09/2019   BUN 14 08/09/2019   CREATININE 0.80 08/09/2019   BILITOT 1.0 08/09/2019   ALKPHOS 106 08/09/2019   AST 17 08/09/2019   ALT 14 08/09/2019   PROT 7.6 08/09/2019   ALBUMIN 4.1 08/09/2019   CALCIUM 9.4 08/09/2019   ANIONGAP 10 08/09/2019   Lab Results  Component Value Date   CHOL 151 05/31/2019   Lab Results  Component Value Date   HDL 45 05/31/2019   Lab Results  Component Value Date   LDLCALC 70 05/31/2019   Lab Results  Component Value Date   TRIG 220 (H) 05/31/2019   Lab Results  Component Value Date   CHOLHDL 3.4 05/31/2019   Lab Results  Component Value Date   HGBA1C 5.6 05/31/2019      Assessment & Plan:  1. Diabetic polyneuropathy associated with type 2 diabetes mellitus (HCC) Control: good Recommend check sugars fasting daily. Recommend check feet daily. Recommend annual eye exams. Medicines: no changes Continue to work on eating a healthy diet and exercise.  Labs drawn today.   - Hemoglobin A1c - Lipid panel  2. Benign essential HTN Well controlled.  No changes to medicines.  Continue to work on eating a healthy diet and exercise.  Labs drawn today.  - CBC with Differential/Platelet - Comprehensive metabolic panel  3. Mild recurrent major depression (HCC) The current medical regimen is effective;  continue present plan and medications.  4. Mixed hyperlipidemia Well controlled.  No changes to medicines.  Continue to work on eating a healthy diet and exercise.  Labs drawn  today.  - lipid panel - CMP  5. Status post total knee replacement, unspecified laterality Recovering well.  6. Mild intermittent asthma without complication The current medical regimen is effective;  continue present plan and medications.  7. Idiopathic progressive neuropathy Continue lyrica. Stop amitriptyline as it may be contributing to her difficulty losing weight.  8. Class 1 obesity due to excess calories with serious comorbidity and body mass index (BMI) of 33.0 to 33.9 in adult Recommend continue to work on eating healthy diet and exercise.  9. Primary insomnia Stop amitriptyline and mirtazepine. Start on tizanidine (she tried one of her husband's and it worked very well.)  10. Falls: left leg giving out. Patient to discuss with physical therapy. She is not describing syncope.  Meds ordered this encounter  Medications  . tiZANidine (ZANAFLEX) 4 MG tablet    Sig: Take 1 tablet (4 mg total) by mouth at bedtime.    Dispense:  30 tablet    Refill:  2  . ondansetron (ZOFRAN) 4 MG tablet    Sig: TAKE ONE TABLET BY MOUTH EVERY EIGHT HOURS AS NEEDED NAUSEA    Dispense:  90 tablet    Refill:  1  . phentermine (ADIPEX-P) 37.5 MG tablet    Sig: Take 1 tablet (37.5 mg total) by mouth every morning.    Dispense:  30 tablet    Refill:  2    Follow-up: Return in about 3 months (around 12/13/2019) for fasting.    Blane Ohara, MD

## 2019-09-13 LAB — LIPID PANEL
Chol/HDL Ratio: 3.5 ratio (ref 0.0–4.4)
Cholesterol, Total: 174 mg/dL (ref 100–199)
HDL: 50 mg/dL (ref >39)
LDL Chol Calc (NIH): 84 mg/dL (ref 0–99)
Triglycerides: 239 mg/dL — ABNORMAL HIGH (ref 0–149)
VLDL Cholesterol Cal: 40 mg/dL (ref 5–40)

## 2019-09-13 LAB — CBC WITH DIFFERENTIAL/PLATELET
Basophils Absolute: 0.1 10*3/uL (ref 0.0–0.2)
Basos: 1 %
EOS (ABSOLUTE): 0.1 10*3/uL (ref 0.0–0.4)
Eos: 1 %
Hematocrit: 39.9 % (ref 34.0–46.6)
Hemoglobin: 13.3 g/dL (ref 11.1–15.9)
Immature Grans (Abs): 0 10*3/uL (ref 0.0–0.1)
Immature Granulocytes: 1 %
Lymphocytes Absolute: 2.3 10*3/uL (ref 0.7–3.1)
Lymphs: 27 %
MCH: 29.5 pg (ref 26.6–33.0)
MCHC: 33.3 g/dL (ref 31.5–35.7)
MCV: 89 fL (ref 79–97)
Monocytes Absolute: 0.6 10*3/uL (ref 0.1–0.9)
Monocytes: 6 %
Neutrophils Absolute: 5.6 10*3/uL (ref 1.4–7.0)
Neutrophils: 64 %
Platelets: 323 10*3/uL (ref 150–450)
RBC: 4.51 x10E6/uL (ref 3.77–5.28)
RDW: 13.2 % (ref 11.7–15.4)
WBC: 8.6 10*3/uL (ref 3.4–10.8)

## 2019-09-13 LAB — COMPREHENSIVE METABOLIC PANEL
ALT: 8 IU/L (ref 0–32)
AST: 10 IU/L (ref 0–40)
Albumin/Globulin Ratio: 1.2 (ref 1.2–2.2)
Albumin: 3.9 g/dL (ref 3.8–4.8)
Alkaline Phosphatase: 132 IU/L — ABNORMAL HIGH (ref 48–121)
BUN/Creatinine Ratio: 14 (ref 12–28)
BUN: 12 mg/dL (ref 8–27)
Bilirubin Total: <0.2 mg/dL (ref 0.0–1.2)
CO2: 28 mmol/L (ref 20–29)
Calcium: 9.5 mg/dL (ref 8.7–10.3)
Chloride: 98 mmol/L (ref 96–106)
Creatinine, Ser: 0.85 mg/dL (ref 0.57–1.00)
GFR calc Af Amer: 81 mL/min/{1.73_m2} (ref >59)
GFR calc non Af Amer: 71 mL/min/{1.73_m2} (ref >59)
Globulin, Total: 3.3 g/dL (ref 1.5–4.5)
Glucose: 111 mg/dL — ABNORMAL HIGH (ref 65–99)
Potassium: 5 mmol/L (ref 3.5–5.2)
Sodium: 140 mmol/L (ref 134–144)
Total Protein: 7.2 g/dL (ref 6.0–8.5)

## 2019-09-13 LAB — HEMOGLOBIN A1C
Est. average glucose Bld gHb Est-mCnc: 111 mg/dL
Hgb A1c MFr Bld: 5.5 % (ref 4.8–5.6)

## 2019-09-13 LAB — CARDIOVASCULAR RISK ASSESSMENT

## 2019-09-17 ENCOUNTER — Other Ambulatory Visit: Payer: Self-pay

## 2019-09-17 MED ORDER — FENOFIBRATE 160 MG PO TABS: 160.0000 mg | ORAL_TABLET | Freq: Every day | ORAL | 2 refills | Status: DC

## 2019-09-24 ENCOUNTER — Other Ambulatory Visit: Payer: Self-pay | Admitting: Physician Assistant

## 2019-09-26 ENCOUNTER — Other Ambulatory Visit: Payer: Self-pay | Admitting: Physician Assistant

## 2019-09-28 ENCOUNTER — Other Ambulatory Visit: Payer: Self-pay | Admitting: Family Medicine

## 2019-10-01 ENCOUNTER — Other Ambulatory Visit: Payer: Self-pay

## 2019-10-01 MED ORDER — PHENTERMINE HCL 37.5 MG PO TABS: 37.5000 mg | ORAL_TABLET | Freq: Every morning | ORAL | 0 refills | Status: DC

## 2019-10-02 ENCOUNTER — Other Ambulatory Visit: Payer: Self-pay | Admitting: Family Medicine

## 2019-10-11 ENCOUNTER — Other Ambulatory Visit: Payer: Self-pay

## 2019-10-11 DIAGNOSIS — F330 Major depressive disorder, recurrent, mild: Secondary | ICD-10-CM

## 2019-10-11 MED ORDER — AMITRIPTYLINE HCL 50 MG PO TABS: 50.0000 mg | ORAL_TABLET | Freq: Every day | ORAL | 2 refills | Status: DC

## 2019-10-11 NOTE — Telephone Encounter (Signed)
Patient states she would like to restart Amitriptyline 50 mg, she thought she would not need it however she does.

## 2019-11-12 ENCOUNTER — Other Ambulatory Visit: Payer: Self-pay | Admitting: Family Medicine

## 2019-12-17 ENCOUNTER — Ambulatory Visit: Payer: 59 | Admitting: Family Medicine

## 2019-12-19 ENCOUNTER — Encounter: Payer: Self-pay | Admitting: Family Medicine

## 2019-12-19 ENCOUNTER — Ambulatory Visit (INDEPENDENT_AMBULATORY_CARE_PROVIDER_SITE_OTHER): Payer: Medicare Other | Admitting: Family Medicine

## 2019-12-19 ENCOUNTER — Other Ambulatory Visit: Payer: Self-pay

## 2019-12-19 ENCOUNTER — Other Ambulatory Visit: Payer: Self-pay | Admitting: Family Medicine

## 2019-12-19 VITALS — BP 124/70 | HR 50 | Temp 97.3°F | Ht 63.0 in | Wt 228.0 lb

## 2019-12-19 DIAGNOSIS — E782 Mixed hyperlipidemia: Secondary | ICD-10-CM

## 2019-12-19 DIAGNOSIS — I10 Essential (primary) hypertension: Secondary | ICD-10-CM

## 2019-12-19 DIAGNOSIS — Z23 Encounter for immunization: Secondary | ICD-10-CM

## 2019-12-19 DIAGNOSIS — G603 Idiopathic progressive neuropathy: Secondary | ICD-10-CM

## 2019-12-19 DIAGNOSIS — E1142 Type 2 diabetes mellitus with diabetic polyneuropathy: Secondary | ICD-10-CM

## 2019-12-19 DIAGNOSIS — F332 Major depressive disorder, recurrent severe without psychotic features: Secondary | ICD-10-CM

## 2019-12-19 MED ORDER — VICTOZA 18 MG/3ML ~~LOC~~ SOPN: 1.8000 mg | PEN_INJECTOR | Freq: Every day | SUBCUTANEOUS | Status: DC

## 2019-12-19 MED ORDER — VORTIOXETINE HBR 20 MG PO TABS: 20.0000 mg | ORAL_TABLET | Freq: Every day | ORAL | 0 refills | Status: DC

## 2019-12-19 NOTE — Progress Notes (Signed)
   Covid-19 Vaccination Clinic  Name:  Hailey Greene    MRN: [id number] DOB: [birth year]  12/19/2019  Hailey Greene was observed post Covid-19 immunization for 15 minutes without incident. She was provided with Vaccine Information Sheet and instruction to access the V-Safe system.   Hailey Greene was instructed to call 911 with any severe reactions post vaccine: Marland Kitchen Difficulty breathing  . Swelling of face and throat  . A fast heartbeat  . A bad rash all over body  . Dizziness and weakness

## 2019-12-19 NOTE — Progress Notes (Signed)
Established Patient Office Visit  Subjective:  Patient ID: Hailey Greene, female    DOB: 29-Aug-1950  Age: 69 y.o. MRN: [id number]  CC:  Chief Complaint  Patient presents with  . Diabetes  . Hyperlipidemia  . Gastroesophageal Reflux  . Hypertension  . Depression    HPI Hailey Greene presents for follow up of diabetes, hyperlipidemia.  Diabetes: The patient presents with history of (type 2) diabetes mellitus with neuropathy. The patient maintains a diabetic diet and is exercising at physical therapy.  She is keeping a glucose diary. Sugars runs 114-121. She is compliant with taking metformin 500 mg once daily, lyrica 200 mg one three times a day,Victoza 1.2 mg once daily and amitritypline 50 mg once daily.  Patient denies hypoglycemia. In regard to preventative care, the patient performs foot self-exams daily and next ophthalmology exam is in 11/2019.  Mixed hyperlipidemia: Pt presents with hyperlipidemia.  Compliance with treatment has been good; currently on Lipitor 20 mg once daily daily and fenofibrate 160 mg once daily.  The patient is compliant with medications (lipitor) maintains a low cholesterol diet , follows up as directed , and maintains an exercise regimen . The patient denies experiencing any hypercholesterolemia related symptoms.   Asthma: Patient using symbicort. Denies dyspnea. She has some cough due to allergies.  Obesity with BMI 33. Eating healthy.  She did phentermine for 3 months prior to her knee surgery.  Has not been taking recently.  GERD: Taking omeprazole which helps.   Urge incontinence: Vesicare helps.   Hypertension: Currently on hctz 25 mg once daily.   Depression: Currently, pt is now doing well with depression.  She is currently on Trintellix 10 mg daily.  I discontinued his her Cymbalta because it was not working.  I put her on mirtazapine to help her sleep.  She does not feel she needs that anymore.  This also might be contributing to weight  gain.    Past Medical History:  Diagnosis Date  . Acute hypoxemic respiratory failure due to COVID-19 (HCC)   . Chicken pox   . Depression   . Diabetes (HCC)   . GERD (gastroesophageal reflux disease)   . Hypertension   . Idiopathic progressive neuropathy   . Major depressive disorder   . Mixed hyperlipidemia   . Neuropathy   . Osteoarthritis   . Pneumonia   . Primary insomnia   . RLS (restless legs syndrome)   . Tachycardia   . Urge incontinence   . UTI (lower urinary tract infection)   . Weakness     Past Surgical History:  Procedure Laterality Date  . ABDOMINAL HYSTERECTOMY    . APPENDECTOMY    . CHOLECYSTECTOMY    . HERNIA REPAIR    . REPLACEMENT TOTAL KNEE Right   . TONSILLECTOMY    . TOTAL KNEE ARTHROPLASTY Left 07/15/2019   Procedure: TOTAL KNEE ARTHROPLASTY;  Surgeon: Dannielle Huh, MD;  Location: [facility];  Service: Orthopedics;  Laterality: Left;    Family History  Problem Relation Age of Onset  . Thyroid disease Mother   . Cancer Mother   . Migraines Mother   . Brain cancer Father   . Heart disease Maternal Grandmother   . Diabetes Daughter     Social History   Socioeconomic History  . Marital status: Married    Spouse name: Not on file  . Number of children: 3  . Years of education: 24  . Highest education level: Not on file  Occupational History  . Occupation: Housewife  Tobacco Use  . Smoking status: Former Smoker    Packs/day: 2.00    Years: 12.00    Pack years: 24.00    Types: Cigarettes    Quit date: 05/25/2010    Years since quitting: 9.5  . Smokeless tobacco: Never Used  Vaping Use  . Vaping Use: Never used  Substance and Sexual Activity  . Alcohol use: Yes    Comment: occasional  . Drug use: No  . Sexual activity: Not on file  Other Topics Concern  . Not on file  Social History Narrative   Born and raised in [place], [place]. Currently lives in a house with her husband. 1 dog. Fun: Garden, feed birds, swimming   Denies any  religious beliefs effecting health care.    Social Determinants of Health   Financial Resource Strain:   . Difficulty of Paying Living Expenses: Not on file  Food Insecurity:   . Worried About Programme researcher, broadcasting/film/video in the Last Year: Not on file  . Ran Out of Food in the Last Year: Not on file  Transportation Needs:   . Lack of Transportation (Medical): Not on file  . Lack of Transportation (Non-Medical): Not on file  Physical Activity:   . Days of Exercise per Week: Not on file  . Minutes of Exercise per Session: Not on file  Stress:   . Feeling of Stress : Not on file  Social Connections:   . Frequency of Communication with Friends and Family: Not on file  . Frequency of Social Gatherings with Friends and Family: Not on file  . Attends Religious Services: Not on file  . Active Member of Clubs or Organizations: Not on file  . Attends Banker Meetings: Not on file  . Marital Status: Not on file  Intimate Partner Violence:   . Fear of Current or Ex-Partner: Not on file  . Emotionally Abused: Not on file  . Physically Abused: Not on file  . Sexually Abused: Not on file    Outpatient Medications Prior to Visit  Medication Sig Dispense Refill  . amitriptyline (ELAVIL) 50 MG tablet Take 1 tablet (50 mg total) by mouth at bedtime. 90 tablet 2  . acetaminophen (TYLENOL) 500 MG tablet Take 500-1,000 mg by mouth every 6 (six) hours as needed (for pain).    Marland Kitchen aspirin EC 325 MG EC tablet Take 1 tablet (325 mg total) by mouth 2 (two) times daily. 30 tablet 0  . atorvastatin (LIPITOR) 20 MG tablet TAKE ONE (1) TABLET ONCE DAILY 90 tablet 0  . cyclobenzaprine (FLEXERIL) 10 MG tablet TAKE 1 TABLET 3 TIMES A DAY AS NEEDED 270 tablet 0  . dicyclomine (BENTYL) 20 MG tablet TAKE ONE TABLET EVERY 6 HOURS AS NEEDED 360 tablet 0  . fenofibrate 160 MG tablet Take 1 tablet (160 mg total) by mouth daily. 30 tablet 2  . glucose blood (ONETOUCH VERIO) test strip     . hydrochlorothiazide  (HYDRODIURIL) 25 MG tablet TAKE ONE (1) TABLET ONCE DAILY 90 tablet 1  . Lancets (ONETOUCH DELICA PLUS LANCET33G) MISC     . metFORMIN (GLUCOPHAGE) 500 MG tablet Take 500 mg by mouth daily with breakfast.    . Multiple Vitamin (MULTIVITAMIN WITH MINERALS) TABS tablet Take 1 tablet by mouth daily.    Marland Kitchen omeprazole (PRILOSEC) 20 MG capsule TAKE ONE CAPSULE BY MOUTH DAILY BEFORE AMEAL (Patient taking differently: Take 20 mg by mouth daily. ) 90 capsule  1  . ondansetron (ZOFRAN) 4 MG tablet TAKE ONE TABLET BY MOUTH EVERY EIGHT HOURS AS NEEDED NAUSEA 90 tablet 1  . pregabalin (LYRICA) 200 MG capsule TAKE ONE (1) CAPSULE THREE (3) TIMES EACH DAY 270 capsule 0  . senna-docusate (SENOKOT-S) 8.6-50 MG tablet Take 1 tablet by mouth at bedtime as needed for mild constipation. 30 tablet 0  . solifenacin (VESICARE) 5 MG tablet TAKE ONE (1) TABLET ONCE DAILY (Patient taking differently: Take 5 mg by mouth daily. ) 30 tablet 2  . SYMBICORT 160-4.5 MCG/ACT inhaler Inhale 2 puffs into the lungs 2 times daily at 12 noon and 4 pm. 1 Inhaler 3  . traMADol (ULTRAM) 50 MG tablet Take 50-100 mg by mouth every 6 (six) hours as needed (pain).    . vitamin B-12 1000 MCG tablet Take 1 tablet (1,000 mcg total) by mouth daily. 30 tablet 0  . DULoxetine (CYMBALTA) 60 MG capsule Take 60 mg by mouth 2 (two) times daily.    . hydrochlorothiazide (HYDRODIURIL) 12.5 MG tablet Take 1 tablet (12.5 mg total) by mouth daily. 30 tablet 0  . mirtazapine (REMERON) 15 MG tablet TAKE ONE TABLET BY MOUTH AT BEDTIME 90 tablet 1  . phentermine (ADIPEX-P) 37.5 MG tablet Take 1 tablet (37.5 mg total) by mouth every morning. 30 tablet 0  . rOPINIRole (REQUIP) 3 MG tablet Take 3 mg by mouth at bedtime.    Marland Kitchen. tiZANidine (ZANAFLEX) 4 MG tablet Take 1 tablet (4 mg total) by mouth at bedtime. 30 tablet 2  . TRINTELLIX 10 MG TABS tablet TAKE 1 TABLET BY MOUTH EVERY DAY 90 tablet 0  . VICTOZA 18 MG/3ML SOPN INJECT 1.2 MG ONCE DAILY (Patient taking  differently: Inject 1.6 mg into the skin daily. ) 3 pen 4   No facility-administered medications prior to visit.    No Known Allergies  ROS Review of Systems  Constitutional: Positive for fatigue and unexpected weight change. Negative for chills and fever.  HENT: Positive for congestion. Negative for ear pain, rhinorrhea and sore throat.   Eyes: Positive for visual disturbance.  Respiratory: Positive for cough and shortness of breath.   Cardiovascular: Negative for chest pain and palpitations.  Gastrointestinal: Negative for abdominal pain, constipation, diarrhea, nausea and vomiting.  Endocrine: Positive for polydipsia and polyphagia.  Genitourinary: Negative for dysuria, frequency and hematuria.  Musculoskeletal: Positive for arthralgias (Knees and fingers.). Negative for back pain and myalgias.  Skin: Positive for rash (Unsure how long its been there.).  Neurological: Positive for weakness Larey Seat(Fell on August 1.  Denies any injuries.  Patient feels like it is because of her knees feeling weak.) and headaches.  Psychiatric/Behavioral: Negative for dysphoric mood. The patient is not nervous/anxious.       Objective:    Physical Exam Constitutional:      Appearance: She is obese.  Cardiovascular:     Rate and Rhythm: Normal rate and regular rhythm.     Heart sounds: Normal heart sounds. No murmur heard.  No gallop.   Pulmonary:     Breath sounds: Normal breath sounds.  Musculoskeletal:     Comments: Walking very well using a walker. S/P Left TKR.  Neurological:     Mental Status: She is alert.  Psychiatric:        Mood and Affect: Mood normal.    Diabetic Foot Exam - Simple   No data filed      BP 124/70   Pulse (!) 50   Temp (!) 97.3  F (36.3 C)   Ht 5\' 3"  (1.6 m)   Wt 228 lb (103.4 kg)   SpO2 97%   BMI 40.39 kg/m  Wt Readings from Last 3 Encounters:  12/19/19 228 lb (103.4 kg)  09/12/19 213 lb 3.2 oz (96.7 kg)  08/13/19 207 lb (93.9 kg)     Health  Maintenance Due  Topic Date Due  . Hepatitis C Screening  Never done  . FOOT EXAM  Never done  . OPHTHALMOLOGY EXAM  Never done  . COLONOSCOPY  Never done  . PNA vac Low Risk Adult (1 of 2 - PCV13) Never done    There are no preventive care reminders to display for this patient.  Lab Results  Component Value Date   TSH 0.885 07/19/2019   Lab Results  Component Value Date   WBC 8.1 12/19/2019   HGB 13.1 12/19/2019   HCT 40.6 12/19/2019   MCV 85 12/19/2019   PLT 296 12/19/2019   Lab Results  Component Value Date   NA 138 12/19/2019   K 4.7 12/19/2019   CO2 25 12/19/2019   GLUCOSE 108 (H) 12/19/2019   BUN 9 12/19/2019   CREATININE 0.84 12/19/2019   BILITOT 0.2 12/19/2019   ALKPHOS 128 (H) 12/19/2019   AST 12 12/19/2019   ALT 12 12/19/2019   PROT 6.5 12/19/2019   ALBUMIN 4.3 12/19/2019   CALCIUM 9.5 12/19/2019   ANIONGAP 10 08/09/2019   Lab Results  Component Value Date   CHOL 150 12/19/2019   Lab Results  Component Value Date   HDL 49 12/19/2019   Lab Results  Component Value Date   LDLCALC 77 12/19/2019   Lab Results  Component Value Date   TRIG 136 12/19/2019   Lab Results  Component Value Date   CHOLHDL 3.1 12/19/2019   Lab Results  Component Value Date   HGBA1C 6.0 (H) 12/19/2019      Assessment & Plan:  1. Diabetic polyneuropathy associated with type 2 diabetes mellitus (HCC) Control: good Recommend check sugars fasting daily. Recommend check feet daily. Recommend annual eye exams. Medicines: Increase Victoza to 1.8 mg daily. Continue to work on eating a healthy diet and exercise.  Labs drawn today.   - Hemoglobin A1c - Lipid panel  2. Benign essential HTN Well controlled.  No changes to medicines.  Continue to work on eating a healthy diet and exercise.  Labs drawn today.  - CBC with Differential/Platelet - Comprehensive metabolic panel  3. Mixed hyperlipidemia Well controlled.  No changes to medicines.  Continue to work on  eating a healthy diet and exercise.  Labs drawn today.  - lipid panel - CMP  4.  Idiopathic progressive neuropathy/diabetic neuropathy. Continue Lyrica and amitriptyline.  5. Severe episode of recurrent major depressive disorder, without psychotic features (HCC) Increase Trintellix to 20 mg once daily.  Remain off Cymbalta.  Stop mirtazapine. - vortioxetine HBr (TRINTELLIX) 20 MG TABS tablet; Take 1 tablet (20 mg total) by mouth daily.  Dispense: 90 tablet; Refill: 0 - Ambulatory referral to Psychology  6. Need for immunization against influenza - Flu Vaccine QUAD High Dose(Fluad)  7. Encounter for immunization - Pfizer SARS-COV-2 Vaccine   Follow-up: Return in about 4 weeks (around 01/16/2020).    01/18/2020, MD

## 2019-12-19 NOTE — Patient Instructions (Addendum)
Discontinue tizanidine.  Start on Flexeril 5 mg 3 times a day as needed (patient has prescription at home) Discontinue mirtazapine.  Increase Trintellix to 20 mg once daily. Please be sure you are not taking duloxetine (Cymbalta) Increase Victoza to 1.8 mg daily. Double check medication list upon returning home to be sure there is no other medications you are taking that I am unaware of. Work on diet and exercise. Refer to our chronic care management pharmacist, Luan Moore, PhD Refer to counseling.   Diabetes Mellitus and Nutrition, Adult When you have diabetes (diabetes mellitus), it is very important to have healthy eating habits because your blood sugar (glucose) levels are greatly affected by what you eat and drink. Eating healthy foods in the appropriate amounts, at about the same times every day, can help you:  Control your blood glucose.  Lower your risk of heart disease.  Improve your blood pressure.  Reach or maintain a healthy weight. Every person with diabetes is different, and each person has different needs for a meal plan. Your health care provider may recommend that you work with a diet and nutrition specialist (dietitian) to make a meal plan that is best for you. Your meal plan may vary depending on factors such as:  The calories you need.  The medicines you take.  Your weight.  Your blood glucose, blood pressure, and cholesterol levels.  Your activity level.  Other health conditions you have, such as heart or kidney disease. How do carbohydrates affect me? Carbohydrates, also called carbs, affect your blood glucose level more than any other type of food. Eating carbs naturally raises the amount of glucose in your blood. Carb counting is a method for keeping track of how many carbs you eat. Counting carbs is important to keep your blood glucose at a healthy level, especially if you use insulin or take certain oral diabetes medicines. It is important to know  how many carbs you can safely have in each meal. This is different for every person. Your dietitian can help you calculate how many carbs you should have at each meal and for each snack. Foods that contain carbs include:  Bread, cereal, rice, pasta, and crackers.  Potatoes and corn.  Peas, beans, and lentils.  Milk and yogurt.  Fruit and juice.  Desserts, such as cakes, cookies, ice cream, and candy. How does alcohol affect me? Alcohol can cause a sudden decrease in blood glucose (hypoglycemia), especially if you use insulin or take certain oral diabetes medicines. Hypoglycemia can be a life-threatening condition. Symptoms of hypoglycemia (sleepiness, dizziness, and confusion) are similar to symptoms of having too much alcohol. If your health care provider says that alcohol is safe for you, follow these guidelines:  Limit alcohol intake to no more than 1 drink per day for nonpregnant women and 2 drinks per day for men. One drink equals 12 oz of beer, 5 oz of wine, or 1 oz of hard liquor.  Do not drink on an empty stomach.  Keep yourself hydrated with water, diet soda, or unsweetened iced tea.  Keep in mind that regular soda, juice, and other mixers may contain a lot of sugar and must be counted as carbs. What are tips for following this plan?  Reading food labels  Start by checking the serving size on the "Nutrition Facts" label of packaged foods and drinks. The amount of calories, carbs, fats, and other nutrients listed on the label is based on one serving of the item. Many items  contain more than one serving per package.  Check the total grams (g) of carbs in one serving. You can calculate the number of servings of carbs in one serving by dividing the total carbs by 15. For example, if a food has 30 g of total carbs, it would be equal to 2 servings of carbs.  Check the number of grams (g) of saturated and trans fats in one serving. Choose foods that have low or no amount of these  fats.  Check the number of milligrams (mg) of salt (sodium) in one serving. Most people should limit total sodium intake to less than 2,300 mg per day.  Always check the nutrition information of foods labeled as "low-fat" or "nonfat". These foods may be higher in added sugar or refined carbs and should be avoided.  Talk to your dietitian to identify your daily goals for nutrients listed on the label. Shopping  Avoid buying canned, premade, or processed foods. These foods tend to be high in fat, sodium, and added sugar.  Shop around the outside edge of the grocery store. This includes fresh fruits and vegetables, bulk grains, fresh meats, and fresh dairy. Cooking  Use low-heat cooking methods, such as baking, instead of high-heat cooking methods like deep frying.  Cook using healthy oils, such as olive, canola, or sunflower oil.  Avoid cooking with butter, cream, or high-fat meats. Meal planning  Eat meals and snacks regularly, preferably at the same times every day. Avoid going long periods of time without eating.  Eat foods high in fiber, such as fresh fruits, vegetables, beans, and whole grains. Talk to your dietitian about how many servings of carbs you can eat at each meal.  Eat 4-6 ounces (oz) of lean protein each day, such as lean meat, chicken, fish, eggs, or tofu. One oz of lean protein is equal to: ? 1 oz of meat, chicken, or fish. ? 1 egg. ?  cup of tofu.  Eat some foods each day that contain healthy fats, such as avocado, nuts, seeds, and fish. Lifestyle  Check your blood glucose regularly.  Exercise regularly as told by your health care provider. This may include: ? 150 minutes of moderate-intensity or vigorous-intensity exercise each week. This could be brisk walking, biking, or water aerobics. ? Stretching and doing strength exercises, such as yoga or weightlifting, at least 2 times a week.  Take medicines as told by your health care provider.  Do not use any  products that contain nicotine or tobacco, such as cigarettes and e-cigarettes. If you need help quitting, ask your health care provider.  Work with a Veterinary surgeon or diabetes educator to identify strategies to manage stress and any emotional and social challenges. Questions to ask a health care provider  Do I need to meet with a diabetes educator?  Do I need to meet with a dietitian?  What number can I call if I have questions?  When are the best times to check my blood glucose? Where to find more information:  American Diabetes Association: diabetes.org  Academy of Nutrition and Dietetics: www.eatright.[employer] of Diabetes and Digestive and Kidney Diseases (NIH): CarFlippers.tn Summary  A healthy meal plan will help you control your blood glucose and maintain a healthy lifestyle.  Working with a diet and nutrition specialist (dietitian) can help you make a meal plan that is best for you.  Keep in mind that carbohydrates (carbs) and alcohol have immediate effects on your blood glucose levels. It is important to  count carbs and to use alcohol carefully. This information is not intended to replace advice given to you by your health care provider. Make sure you discuss any questions you have with your health care provider. Document Revised: 02/24/2017 Document Reviewed: 04/18/2016 Elsevier Patient Education  2020 Elsevier Inc.  Major Depressive Disorder, Adult Major depressive disorder (MDD) is a mental health condition. MDD often makes you feel sad, hopeless, or helpless. MDD can also cause symptoms in your body. MDD can affect your:  Work.  School.  Relationships.  Other normal activities. MDD can range from mild to very bad. It may occur once (single episode MDD). It can also occur many times (recurrent MDD). The main symptoms of MDD often include:  Feeling sad, depressed, or irritable most of the time.  Loss of interest. MDD symptoms also  include:  Sleeping too much or too little.  Eating too much or too little.  A change in your weight.  Feeling tired (fatigue) or having low energy.  Feeling worthless.  Feeling guilty.  Trouble making decisions.  Trouble thinking clearly.  Thoughts of suicide or harming others.  Feeling weak.  Feeling agitated.  Keeping yourself from being around other people (isolation). Follow these instructions at home: Activity  Do these things as told by your doctor: ? Go back to your normal activities. ? Exercise regularly. ? Spend time outdoors. Alcohol  Talk with your doctor about how alcohol can affect your antidepressant medicines.  Do not drink alcohol. Or, limit how much alcohol you drink. ? This means no more than 1 drink a day for nonpregnant women and 2 drinks a day for men. One drink equals one of these:  12 oz of beer.  5 oz of wine.  1 oz of hard liquor. General instructions  Take over-the-counter and prescription medicines only as told by your doctor.  Eat a healthy diet.  Get plenty of sleep.  Find activities that you enjoy. Make time to do them.  Think about joining a support group. Your doctor may be able to suggest a group for you.  Keep all follow-up visits as told by your doctor. This is important. Where to find more information:  [employer] on Mental Illness: ? www.nami.org  [place] of Mental Health: ? http://www.maynard.net/  National Suicide Prevention Lifeline: ? 817-182-7929. This is free, 24-hour help. Contact a doctor if:  Your symptoms get worse.  You have new symptoms. Get help right away if:  You self-harm.  You see, hear, taste, smell, or feel things that are not present (hallucinate). If you ever feel like you may hurt yourself or others, or have thoughts about taking your own life, get help right away. You can go to your nearest emergency department or call:  Your local emergency services (911 in  the [place]  A suicide crisis helpline, such as the National Suicide Prevention Lifeline: ? (805)215-9704. This is open 24 hours a day. This information is not intended to replace advice given to you by your health care provider. Make sure you discuss any questions you have with your health care provider. Document Revised: 02/24/2017 Document Reviewed: 11/29/2015 Elsevier Patient Education  2020 [employer].

## 2019-12-20 LAB — CBC WITH DIFFERENTIAL/PLATELET
Basophils Absolute: 0.1 10*3/uL (ref 0.0–0.2)
Basos: 1 %
EOS (ABSOLUTE): 0.1 10*3/uL (ref 0.0–0.4)
Eos: 1 %
Hematocrit: 40.6 % (ref 34.0–46.6)
Hemoglobin: 13.1 g/dL (ref 11.1–15.9)
Immature Grans (Abs): 0 10*3/uL (ref 0.0–0.1)
Immature Granulocytes: 0 %
Lymphocytes Absolute: 1.7 10*3/uL (ref 0.7–3.1)
Lymphs: 21 %
MCH: 27.4 pg (ref 26.6–33.0)
MCHC: 32.3 g/dL (ref 31.5–35.7)
MCV: 85 fL (ref 79–97)
Monocytes Absolute: 0.6 10*3/uL (ref 0.1–0.9)
Monocytes: 7 %
Neutrophils Absolute: 5.7 10*3/uL (ref 1.4–7.0)
Neutrophils: 70 %
Platelets: 296 10*3/uL (ref 150–450)
RBC: 4.78 x10E6/uL (ref 3.77–5.28)
RDW: 13.6 % (ref 11.7–15.4)
WBC: 8.1 10*3/uL (ref 3.4–10.8)

## 2019-12-20 LAB — LIPID PANEL
Chol/HDL Ratio: 3.1 ratio (ref 0.0–4.4)
Cholesterol, Total: 150 mg/dL (ref 100–199)
HDL: 49 mg/dL (ref >39)
LDL Chol Calc (NIH): 77 mg/dL (ref 0–99)
Triglycerides: 136 mg/dL (ref 0–149)
VLDL Cholesterol Cal: 24 mg/dL (ref 5–40)

## 2019-12-20 LAB — COMPREHENSIVE METABOLIC PANEL
ALT: 12 IU/L (ref 0–32)
AST: 12 IU/L (ref 0–40)
Albumin/Globulin Ratio: 2 (ref 1.2–2.2)
Albumin: 4.3 g/dL (ref 3.8–4.8)
Alkaline Phosphatase: 128 IU/L — ABNORMAL HIGH (ref 44–121)
BUN/Creatinine Ratio: 11 — ABNORMAL LOW (ref 12–28)
BUN: 9 mg/dL (ref 8–27)
Bilirubin Total: 0.2 mg/dL (ref 0.0–1.2)
CO2: 25 mmol/L (ref 20–29)
Calcium: 9.5 mg/dL (ref 8.7–10.3)
Chloride: 98 mmol/L (ref 96–106)
Creatinine, Ser: 0.84 mg/dL (ref 0.57–1.00)
GFR calc Af Amer: 83 mL/min/{1.73_m2} (ref >59)
GFR calc non Af Amer: 72 mL/min/{1.73_m2} (ref >59)
Globulin, Total: 2.2 g/dL (ref 1.5–4.5)
Glucose: 108 mg/dL — ABNORMAL HIGH (ref 65–99)
Potassium: 4.7 mmol/L (ref 3.5–5.2)
Sodium: 138 mmol/L (ref 134–144)
Total Protein: 6.5 g/dL (ref 6.0–8.5)

## 2019-12-20 LAB — HEMOGLOBIN A1C
Est. average glucose Bld gHb Est-mCnc: 126 mg/dL
Hgb A1c MFr Bld: 6 % — ABNORMAL HIGH (ref 4.8–5.6)

## 2019-12-20 LAB — CARDIOVASCULAR RISK ASSESSMENT

## 2019-12-26 ENCOUNTER — Other Ambulatory Visit: Payer: Self-pay | Admitting: Family Medicine

## 2019-12-26 ENCOUNTER — Other Ambulatory Visit: Payer: Self-pay | Admitting: Physician Assistant

## 2019-12-30 ENCOUNTER — Other Ambulatory Visit: Payer: Self-pay | Admitting: Family Medicine

## 2019-12-30 ENCOUNTER — Other Ambulatory Visit: Payer: Self-pay | Admitting: Physician Assistant

## 2019-12-31 ENCOUNTER — Other Ambulatory Visit: Payer: Self-pay

## 2020-01-06 ENCOUNTER — Other Ambulatory Visit: Payer: Self-pay | Admitting: Family Medicine

## 2020-01-09 ENCOUNTER — Other Ambulatory Visit: Payer: Self-pay | Admitting: Family Medicine

## 2020-01-09 DIAGNOSIS — E1142 Type 2 diabetes mellitus with diabetic polyneuropathy: Secondary | ICD-10-CM

## 2020-01-16 ENCOUNTER — Encounter: Payer: Self-pay | Admitting: Family Medicine

## 2020-01-16 ENCOUNTER — Ambulatory Visit (INDEPENDENT_AMBULATORY_CARE_PROVIDER_SITE_OTHER): Payer: 59 | Admitting: Family Medicine

## 2020-01-16 ENCOUNTER — Other Ambulatory Visit: Payer: Self-pay

## 2020-01-16 VITALS — BP 122/68 | HR 58 | Temp 97.9°F | Ht 63.0 in | Wt 230.4 lb

## 2020-01-16 DIAGNOSIS — E1142 Type 2 diabetes mellitus with diabetic polyneuropathy: Secondary | ICD-10-CM | POA: Diagnosis not present

## 2020-01-16 DIAGNOSIS — G603 Idiopathic progressive neuropathy: Secondary | ICD-10-CM | POA: Diagnosis not present

## 2020-01-16 DIAGNOSIS — F330 Major depressive disorder, recurrent, mild: Secondary | ICD-10-CM | POA: Diagnosis not present

## 2020-01-16 MED ORDER — LORATADINE 10 MG PO TABS
ORAL_TABLET | ORAL | 3 refills | Status: DC
Start: 2020-01-16 — End: 2020-01-22

## 2020-01-16 MED ORDER — VICTOZA 18 MG/3ML ~~LOC~~ SOPN: 1.8000 mg | PEN_INJECTOR | Freq: Every day | SUBCUTANEOUS | 2 refills | Status: DC

## 2020-01-16 MED ORDER — OMEPRAZOLE 20 MG PO CPDR
20.0000 mg | DELAYED_RELEASE_CAPSULE | Freq: Every day | ORAL | 3 refills | Status: DC
Start: 2020-01-16 — End: 2020-01-22

## 2020-01-16 MED ORDER — PREGABALIN 200 MG PO CAPS: 200.0000 mg | ORAL_CAPSULE | Freq: Two times a day (BID) | ORAL | 0 refills | Status: DC

## 2020-01-16 MED ORDER — FLUTICASONE PROPIONATE 50 MCG/ACT NA SUSP: 2.0000 | Freq: Every day | NASAL | 6 refills | Status: DC

## 2020-01-16 NOTE — Patient Instructions (Addendum)
Diabetes: Increase victoza 1.8 mg once daily.  Continue metformin 500 mg once daily.  Decrease Lyrica to 200 mg one twice a day.  Try to decrease amitriptyline 50 mg 1/2 pill daily at night, then discontinue if able.  Depression: Continue trintellix 20 mg once  Daily.  Start on flonase   Diabetes Mellitus and Nutrition, Adult When you have diabetes (diabetes mellitus), it is very important to have healthy eating habits because your blood sugar (glucose) levels are greatly affected by what you eat and drink. Eating healthy foods in the appropriate amounts, at about the same times every day, can help you:  Control your blood glucose.  Lower your risk of heart disease.  Improve your blood pressure.  Reach or maintain a healthy weight. Every person with diabetes is different, and each person has different needs for a meal plan. Your health care provider may recommend that you work with a diet and nutrition specialist (dietitian) to make a meal plan that is best for you. Your meal plan may vary depending on factors such as:  The calories you need.  The medicines you take.  Your weight.  Your blood glucose, blood pressure, and cholesterol levels.  Your activity level.  Other health conditions you have, such as heart or kidney disease. How do carbohydrates affect me? Carbohydrates, also called carbs, affect your blood glucose level more than any other type of food. Eating carbs naturally raises the amount of glucose in your blood. Carb counting is a method for keeping track of how many carbs you eat. Counting carbs is important to keep your blood glucose at a healthy level, especially if you use insulin or take certain oral diabetes medicines. It is important to know how many carbs you can safely have in each meal. This is different for every person. Your dietitian can help you calculate how many carbs you should have at each meal and for each snack. Foods that contain carbs  include:  Bread, cereal, rice, pasta, and crackers.  Potatoes and corn.  Peas, beans, and lentils.  Milk and yogurt.  Fruit and juice.  Desserts, such as cakes, cookies, ice cream, and candy. How does alcohol affect me? Alcohol can cause a sudden decrease in blood glucose (hypoglycemia), especially if you use insulin or take certain oral diabetes medicines. Hypoglycemia can be a life-threatening condition. Symptoms of hypoglycemia (sleepiness, dizziness, and confusion) are similar to symptoms of having too much alcohol. If your health care provider says that alcohol is safe for you, follow these guidelines:  Limit alcohol intake to no more than 1 drink per day for nonpregnant women and 2 drinks per day for men. One drink equals 12 oz of beer, 5 oz of wine, or 1 oz of hard liquor.  Do not drink on an empty stomach.  Keep yourself hydrated with water, diet soda, or unsweetened iced tea.  Keep in mind that regular soda, juice, and other mixers may contain a lot of sugar and must be counted as carbs. What are tips for following this plan?  Reading food labels  Start by checking the serving size on the "Nutrition Facts" label of packaged foods and drinks. The amount of calories, carbs, fats, and other nutrients listed on the label is based on one serving of the item. Many items contain more than one serving per package.  Check the total grams (g) of carbs in one serving. You can calculate the number of servings of carbs in one serving by dividing the  total carbs by 15. For example, if a food has 30 g of total carbs, it would be equal to 2 servings of carbs.  Check the number of grams (g) of saturated and trans fats in one serving. Choose foods that have low or no amount of these fats.  Check the number of milligrams (mg) of salt (sodium) in one serving. Most people should limit total sodium intake to less than 2,300 mg per day.  Always check the nutrition information of foods labeled  as "low-fat" or "nonfat". These foods may be higher in added sugar or refined carbs and should be avoided.  Talk to your dietitian to identify your daily goals for nutrients listed on the label. Shopping  Avoid buying canned, premade, or processed foods. These foods tend to be high in fat, sodium, and added sugar.  Shop around the outside edge of the grocery store. This includes fresh fruits and vegetables, bulk grains, fresh meats, and fresh dairy. Cooking  Use low-heat cooking methods, such as baking, instead of high-heat cooking methods like deep frying.  Cook using healthy oils, such as olive, canola, or sunflower oil.  Avoid cooking with butter, cream, or high-fat meats. Meal planning  Eat meals and snacks regularly, preferably at the same times every day. Avoid going long periods of time without eating.  Eat foods high in fiber, such as fresh fruits, vegetables, beans, and whole grains. Talk to your dietitian about how many servings of carbs you can eat at each meal.  Eat 4-6 ounces (oz) of lean protein each day, such as lean meat, chicken, fish, eggs, or tofu. One oz of lean protein is equal to: ? 1 oz of meat, chicken, or fish. ? 1 egg. ?  cup of tofu.  Eat some foods each day that contain healthy fats, such as avocado, nuts, seeds, and fish. Lifestyle  Check your blood glucose regularly.  Exercise regularly as told by your health care provider. This may include: ? 150 minutes of moderate-intensity or vigorous-intensity exercise each week. This could be brisk walking, biking, or water aerobics. ? Stretching and doing strength exercises, such as yoga or weightlifting, at least 2 times a week.  Take medicines as told by your health care provider.  Do not use any products that contain nicotine or tobacco, such as cigarettes and e-cigarettes. If you need help quitting, ask your health care provider.  Work with a Social worker or diabetes educator to identify strategies to  manage stress and any emotional and social challenges. Questions to ask a health care provider  Do I need to meet with a diabetes educator?  Do I need to meet with a dietitian?  What number can I call if I have questions?  When are the best times to check my blood glucose? Where to find more information:  American Diabetes Association: diabetes.org  Academy of Nutrition and Dietetics: www.eatright.[employer] of Diabetes and Digestive and Kidney Diseases (NIH): [employer] Summary  A healthy meal plan will help you control your blood glucose and maintain a healthy lifestyle.  Working with a diet and nutrition specialist (dietitian) can help you make a meal plan that is best for you.  Keep in mind that carbohydrates (carbs) and alcohol have immediate effects on your blood glucose levels. It is important to count carbs and to use alcohol carefully. This information is not intended to replace advice given to you by your health care provider. Make sure you discuss any questions you have with your  health care provider. Document Revised: 02/24/2017 Document Reviewed: 04/18/2016 Elsevier Patient Education  2020 [employer].

## 2020-01-16 NOTE — Progress Notes (Signed)
Established Patient Office Visit  Subjective:  Patient ID: Hailey Greene, female    DOB: 04-Mar-1951  Age: 69 y.o. MRN: [id number]  CC:  Chief Complaint  Patient presents with  . Diabetes    4W follow up    HPI Hailey Greene presents for follow up of diabetes, hyperlipidemia.  Depression: Increased Trintellix to 20 mg once daily and discontinued Cymbalta.  Stopped mirtazapine.   Diabetes: The patient presents with history of (type 2) diabetes mellitus with neuropathy. The patient maintains a diabetic diet and is exercising at physical therapy.  She is keeping a glucose diary. Sugars runs 112-115.Marland Kitchen She is compliant with taking metformin 500 mg once daily, lyrica 200 mg one three times a day,Victoza 1.2 mg once daily and amitritypline 50 mg once daily.   Morbid obesity: Walking around the outside of the house. Eating healthy, but may be overeating.    Past Medical History:  Diagnosis Date  . Acute hypoxemic respiratory failure due to COVID-19 (HCC)   . Chicken pox   . Depression   . Diabetes (HCC)   . GERD (gastroesophageal reflux disease)   . Hypertension   . Idiopathic progressive neuropathy   . Major depressive disorder   . Mixed hyperlipidemia   . Neuropathy   . Osteoarthritis   . Pneumonia   . Primary insomnia   . RLS (restless legs syndrome)   . Tachycardia   . Urge incontinence   . UTI (lower urinary tract infection)   . Weakness     Past Surgical History:  Procedure Laterality Date  . ABDOMINAL HYSTERECTOMY    . APPENDECTOMY    . CHOLECYSTECTOMY    . HERNIA REPAIR    . REPLACEMENT TOTAL KNEE Right   . TONSILLECTOMY    . TOTAL KNEE ARTHROPLASTY Left 07/15/2019   Procedure: TOTAL KNEE ARTHROPLASTY;  Surgeon: Dannielle Huh, MD;  Location: [facility];  Service: Orthopedics;  Laterality: Left;    Family History  Problem Relation Age of Onset  . Thyroid disease Mother   . Cancer Mother   . Migraines Mother   . Brain cancer Father   . Heart disease  Maternal Grandmother   . Diabetes Daughter     Social History   Socioeconomic History  . Marital status: Married    Spouse name: Not on file  . Number of children: 3  . Years of education: 70  . Highest education level: Not on file  Occupational History  . Occupation: Housewife  Tobacco Use  . Smoking status: Former Smoker    Packs/day: 2.00    Years: 12.00    Pack years: 24.00    Types: Cigarettes    Quit date: 05/25/2010    Years since quitting: 9.6  . Smokeless tobacco: Never Used  Vaping Use  . Vaping Use: Never used  Substance and Sexual Activity  . Alcohol use: Yes    Comment: occasional  . Drug use: No  . Sexual activity: Not on file  Other Topics Concern  . Not on file  Social History Narrative   Born and raised in [place], [place]. Currently lives in a house with her husband. 1 dog. Fun: Garden, feed birds, swimming   Denies any religious beliefs effecting health care.    Social Determinants of Health   Financial Resource Strain:   . Difficulty of Paying Living Expenses: Not on file  Food Insecurity: No Food Insecurity  . Worried About Programme researcher, broadcasting/film/video in the Last Year: Never  true  . Ran Out of Food in the Last Year: Never true  Transportation Needs:   . Lack of Transportation (Medical): Not on file  . Lack of Transportation (Non-Medical): Not on file  Physical Activity:   . Days of Exercise per Week: Not on file  . Minutes of Exercise per Session: Not on file  Stress:   . Feeling of Stress : Not on file  Social Connections:   . Frequency of Communication with Friends and Family: Not on file  . Frequency of Social Gatherings with Friends and Family: Not on file  . Attends Religious Services: Not on file  . Active Member of Clubs or Organizations: Not on file  . Attends Banker Meetings: Not on file  . Marital Status: Not on file  Intimate Partner Violence:   . Fear of Current or Ex-Partner: Not on file  . Emotionally Abused: Not on file    . Physically Abused: Not on file  . Sexually Abused: Not on file    Outpatient Medications Prior to Visit  Medication Sig Dispense Refill  . acetaminophen (TYLENOL) 500 MG tablet Take 500-1,000 mg by mouth every 6 (six) hours as needed (for pain).    Marland Kitchen aspirin EC 325 MG EC tablet Take 1 tablet (325 mg total) by mouth 2 (two) times daily. 30 tablet 0  . Multiple Vitamin (MULTIVITAMIN WITH MINERALS) TABS tablet Take 1 tablet by mouth daily.    . vitamin B-12 1000 MCG tablet Take 1 tablet (1,000 mcg total) by mouth daily. 30 tablet 0  . ALLERGY RELIEF 10 MG tablet TAKE ONE (1) TABLET BY MOUTH ONCE DAILY 90 tablet 1  . amitriptyline (ELAVIL) 50 MG tablet Take 1 tablet (50 mg total) by mouth at bedtime. 90 tablet 2  . atorvastatin (LIPITOR) 20 MG tablet TAKE ONE (1) TABLET ONCE DAILY 90 tablet 0  . BELSOMRA 10 MG TABS TAKE 1 TABLET BY MOUTH WITHIN 30 MINUTES OF BEDTIME. ONLY TAKE IF WITHIN 7 HOURS OF WAKING TIME 30 tablet 0  . cyclobenzaprine (FLEXERIL) 10 MG tablet TAKE 1 TABLET 3 TIMES A DAY AS NEEDED 270 tablet 0  . dicyclomine (BENTYL) 20 MG tablet TAKE ONE TABLET EVERY 6 HOURS AS NEEDED 360 tablet 0  . fenofibrate 160 MG tablet Take 1 tablet (160 mg total) by mouth daily. 30 tablet 2  . glucose blood (ONETOUCH VERIO) test strip     . hydrochlorothiazide (HYDRODIURIL) 25 MG tablet TAKE ONE (1) TABLET ONCE DAILY 90 tablet 1  . Lancets (ONETOUCH DELICA PLUS LANCET33G) MISC     . metFORMIN (GLUCOPHAGE) 500 MG tablet TAKE 1 TABLET ONCE DAILY WITH MORNING MEAL 90 tablet 1  . omeprazole (PRILOSEC) 20 MG capsule TAKE ONE CAPSULE BY MOUTH DAILY BEFORE AMEAL (Patient taking differently: Take 20 mg by mouth daily. ) 90 capsule 1  . ondansetron (ZOFRAN) 4 MG tablet TAKE ONE TABLET BY MOUTH EVERY EIGHT HOURS AS NEEDED NAUSEA 90 tablet 1  . pregabalin (LYRICA) 200 MG capsule TAKE ONE (1) CAPSULE THREE (3) TIMES EACH DAY 270 capsule 0  . rOPINIRole (REQUIP) 3 MG tablet TAKE ONE (1) TABLET BY MOUTH ONCE  DAILY (1-3 HOURS BEFORE BEDTIME) 90 tablet 0  . senna-docusate (SENOKOT-S) 8.6-50 MG tablet Take 1 tablet by mouth at bedtime as needed for mild constipation. 30 tablet 0  . solifenacin (VESICARE) 5 MG tablet TAKE ONE (1) TABLET ONCE DAILY (Patient taking differently: Take 5 mg by mouth daily. ) 30 tablet 2  .  SYMBICORT 160-4.5 MCG/ACT inhaler Inhale 2 puffs into the lungs 2 times daily at 12 noon and 4 pm. 1 Inhaler 3  . traMADol (ULTRAM) 50 MG tablet Take 50-100 mg by mouth every 6 (six) hours as needed (pain).    . TRINTELLIX 10 MG TABS tablet TAKE 1 TABLET BY MOUTH EVERY DAY 90 tablet 0  . VICTOZA 18 MG/3ML SOPN INJECT 1.2 MG UNDER THE SKIN ONCE DAILY 15 mL 2  . vortioxetine HBr (TRINTELLIX) 20 MG TABS tablet Take 1 tablet (20 mg total) by mouth daily. 90 tablet 0   No facility-administered medications prior to visit.    No Known Allergies  ROS Review of Systems  Constitutional: Positive for fatigue and unexpected weight change. Negative for chills and fever.  HENT: Positive for congestion and sore throat. Negative for ear pain and rhinorrhea.   Eyes: Negative for visual disturbance.  Respiratory: Positive for cough. Negative for shortness of breath.   Cardiovascular: Negative for chest pain and palpitations.  Gastrointestinal: Negative for abdominal pain, constipation, diarrhea, nausea and vomiting.  Endocrine: Positive for polydipsia and polyphagia. Negative for polyuria.  Genitourinary: Negative for dysuria, frequency and hematuria.  Musculoskeletal: Negative for arthralgias, back pain and myalgias.  Skin: Positive for rash.  Neurological: Positive for weakness. Negative for headaches.  Psychiatric/Behavioral: Negative for dysphoric mood. The patient is not nervous/anxious.       Objective:    Physical Exam Constitutional:      Appearance: She is obese.  HENT:     Right Ear: Tympanic membrane normal.     Left Ear: Tympanic membrane normal.     Nose: Congestion (pale  edema) and rhinorrhea present.     Mouth/Throat:     Mouth: Mucous membranes are moist.     Pharynx: No oropharyngeal exudate or posterior oropharyngeal erythema.  Neck:     Vascular: No carotid bruit.  Cardiovascular:     Rate and Rhythm: Normal rate and regular rhythm.     Heart sounds: Normal heart sounds. No murmur heard.   Pulmonary:     Effort: Pulmonary effort is normal.     Breath sounds: Normal breath sounds.  Abdominal:     General: Bowel sounds are normal.     Tenderness: There is no abdominal tenderness.  Musculoskeletal:     Comments: Walking very well using a walker. S/P Left TKR.  Lymphadenopathy:     Cervical: No cervical adenopathy.  Neurological:     Mental Status: She is alert and oriented to person, place, and time.  Psychiatric:        Mood and Affect: Mood normal.    Diabetic Foot Exam - Simple   Simple Foot Form Visual Inspection No deformities, no ulcerations, no other skin breakdown bilaterally: Yes Sensation Testing See comments: Yes Pulse Check Posterior Tibialis and Dorsalis pulse intact bilaterally: Yes Comments Pt could not feel any touch on her right foot until I got to #8. Left foot was fine. Pt feet look good just a little dry.     BP 122/68 (BP Location: Right Arm, Patient Position: Sitting, Cuff Size: Normal)   Pulse (!) 58   Temp 97.9 F (36.6 C) (Temporal)   Ht 5\' 3"  (1.6 m)   Wt 230 lb 6.4 oz (104.5 kg)   SpO2 99%   BMI 40.81 kg/m  Wt Readings from Last 3 Encounters:  01/16/20 230 lb 6.4 oz (104.5 kg)  12/19/19 228 lb (103.4 kg)  09/12/19 213 lb 3.2 oz (96.7 kg)  Health Maintenance Due  Topic Date Due  . Hepatitis C Screening  Never done  . OPHTHALMOLOGY EXAM  Never done  . COLONOSCOPY  Never done  . PNA vac Low Risk Adult (1 of 2 - PCV13) Never done    There are no preventive care reminders to display for this patient.  Lab Results  Component Value Date   TSH 0.885 07/19/2019   Lab Results  Component Value  Date   WBC 8.1 12/19/2019   HGB 13.1 12/19/2019   HCT 40.6 12/19/2019   MCV 85 12/19/2019   PLT 296 12/19/2019   Lab Results  Component Value Date   NA 138 12/19/2019   K 4.7 12/19/2019   CO2 25 12/19/2019   GLUCOSE 108 (H) 12/19/2019   BUN 9 12/19/2019   CREATININE 0.84 12/19/2019   BILITOT 0.2 12/19/2019   ALKPHOS 128 (H) 12/19/2019   AST 12 12/19/2019   ALT 12 12/19/2019   PROT 6.5 12/19/2019   ALBUMIN 4.3 12/19/2019   CALCIUM 9.5 12/19/2019   ANIONGAP 10 08/09/2019   Lab Results  Component Value Date   CHOL 150 12/19/2019   Lab Results  Component Value Date   HDL 49 12/19/2019   Lab Results  Component Value Date   LDLCALC 77 12/19/2019   Lab Results  Component Value Date   TRIG 136 12/19/2019   Lab Results  Component Value Date   CHOLHDL 3.1 12/19/2019   Lab Results  Component Value Date   HGBA1C 6.0 (H) 12/19/2019      Assessment & Plan:  1. Diabetic polyneuropathy associated with type 2 diabetes mellitus (HCC) Control: good Medicines: Increase Victoza to 1.8 mg daily. Continue to work on eating a healthy diet and exercise.   2. Morbid obesity, bmi 40, with comorbidities Decrease lyrica to 200 mg one twice a day.  Decrease amitriptiline to 50 mg 1/2 daily.  3.  Idiopathic progressive neuropathy/diabetic neuropathy. Continue Lyrica and amitriptyline, but at decreased doses.  4. Recurrent major mild depressive disorder, without psychotic features (HCC) Continue Trintellix to 20 mg once daily.    Follow-up: Return in about 2 months (around 03/24/2020) for fasting. Needs AWV after 12/10 with Flonnie HailstoneShannon Heaton, NP or Dr. Judee Claraorum.    Blane OharaKirsten Rajni Holsworth, MD

## 2020-01-17 NOTE — Chronic Care Management (AMB) (Signed)
Chronic Care Management Pharmacy  Name: Hailey Greene  MRN: [id number] DOB: [birth year]  Chief Complaint/ HPI  Hailey Greene,  69 y.o. , female presents for their Initial CCM visit with the clinical pharmacist In office.  PCP : Rochel Brome, MD  Their chronic conditions include: hypertension, asthma, type 2 diabetes, diabetic polyneuropathy, osteoarthritis of knee, major depressive disorder, depression, hyperlipidemia, insomnia.   Office Visits: 01/16/2020 - Increase Trintellix. Remain off Cymbalta. Stop Mirtazapine.  12/19/2019 - COVID vaccine. Increase Victoza to 1.8 mg daily. Trintellix 20 mg daily. Referral to psychology. Flu vaccine given.  09/12/2019 - continue Lyrica. D/C amitriptyline and mirtazapine due to difficulty losing weight. Begin tizanidine.  08/13/2019 - acute cystitis and depression - change Trintellix to Pristiq 50 mg daily.  08/07/2019 - video visit with Adron Bene for gastroenteritis - recommend clear liquids, bland diet, change zofran to promethazine. Continue prilosec as prescribed.  Consult Visit: 10/10/2019 - ortho visit - continue home exercise program.  08/22/2019 - ortho visit - doing well. Continue with PT.  08/09/2019 - ED visit for acute cystitis - keflex for 7 days and OD zofran.  08/08/2019 - ED visit waited 1 hour.  07/25/2019 - ortho visit - doing home health physical therapy.  07/19/2019 - ED to hospital admission - altered mental status after knee replacement.  Medications: Outpatient Encounter Medications as of 01/21/2020  Medication Sig  . acetaminophen (TYLENOL) 500 MG tablet Take 500-1,000 mg by mouth every 6 (six) hours as needed (for pain).  Marland Kitchen amitriptyline (ELAVIL) 50 MG tablet Take 1 tablet (50 mg total) by mouth at bedtime.  Marland Kitchen aspirin EC 325 MG EC tablet Take 1 tablet (325 mg total) by mouth 2 (two) times daily.  Marland Kitchen atorvastatin (LIPITOR) 20 MG tablet TAKE ONE (1) TABLET ONCE DAILY  . BELSOMRA 10 MG TABS TAKE 1 TABLET BY MOUTH  WITHIN 30 MINUTES OF BEDTIME. ONLY TAKE IF WITHIN 7 HOURS OF WAKING TIME  . cyclobenzaprine (FLEXERIL) 10 MG tablet TAKE 1 TABLET 3 TIMES A DAY AS NEEDED  . dicyclomine (BENTYL) 20 MG tablet TAKE ONE TABLET EVERY 6 HOURS AS NEEDED  . fenofibrate 160 MG tablet Take 1 tablet (160 mg total) by mouth daily.  . fluticasone (FLONASE) 50 MCG/ACT nasal spray Place 2 sprays into both nostrils daily.  Marland Kitchen glucose blood (ONETOUCH VERIO) test strip   . hydrochlorothiazide (HYDRODIURIL) 25 MG tablet TAKE ONE (1) TABLET ONCE DAILY  . Lancets (ONETOUCH DELICA PLUS [id number]) MISC   . liraglutide (VICTOZA) 18 MG/3ML SOPN Inject 1.8 mg into the skin daily.  Marland Kitchen loratadine (ALLERGY RELIEF) 10 MG tablet TAKE ONE (1) TABLET BY MOUTH ONCE DAILY  . metFORMIN (GLUCOPHAGE) 500 MG tablet TAKE 1 TABLET ONCE DAILY WITH MORNING MEAL  . Multiple Vitamin (MULTIVITAMIN WITH MINERALS) TABS tablet Take 1 tablet by mouth daily.  Marland Kitchen omeprazole (PRILOSEC) 20 MG capsule Take 1 capsule (20 mg total) by mouth daily.  . ondansetron (ZOFRAN) 4 MG tablet TAKE ONE TABLET BY MOUTH EVERY EIGHT HOURS AS NEEDED NAUSEA  . pregabalin (LYRICA) 200 MG capsule Take 1 capsule (200 mg total) by mouth 2 (two) times daily.  Marland Kitchen rOPINIRole (REQUIP) 3 MG tablet TAKE ONE (1) TABLET BY MOUTH ONCE DAILY (1-3 HOURS BEFORE BEDTIME)  . solifenacin (VESICARE) 5 MG tablet TAKE ONE (1) TABLET ONCE DAILY (Patient taking differently: Take 5 mg by mouth daily. )  . SYMBICORT 160-4.5 MCG/ACT inhaler Inhale 2 puffs into the lungs 2 times daily at 12 noon  and 4 pm.  . vitamin B-12 1000 MCG tablet Take 1 tablet (1,000 mcg total) by mouth daily.  Marland Kitchen vortioxetine HBr (TRINTELLIX) 20 MG TABS tablet Take 1 tablet (20 mg total) by mouth daily.   No facility-administered encounter medications on file as of 01/21/2020.     Current Diagnosis/Assessment:  Goals Addressed            This Visit's Progress   . Pharmacy Care Plan       CARE PLAN ENTRY (see longitudinal plan  of care for additional care plan information)  Current Barriers:  . Chronic Disease Management support, education, and care coordination needs related to Hypertension, Hyperlipidemia, Diabetes, and COPD   Hypertension BP Readings from Last 3 Encounters:  01/16/20 122/68  12/19/19 124/70  09/12/19 114/60   . Pharmacist Clinical Goal(s): o Over the next 90 days, patient will work with PharmD and providers to maintain BP goal <130/80 . Current regimen:  o Hydrochlorothiazide 25 mg daily . Interventions: o Discussed increasing exercise as tolerated to goal of 30 minutes each day. Patient currently around 10 minutes daily.  . Patient self care activities - Over the next 90 days, patient will: o Check BP weekly, document, and provide at future appointments o Ensure daily salt intake < 2300 mg/day  Hyperlipidemia Lab Results  Component Value Date/Time   LDLCALC 77 12/19/2019 09:35 AM   . Pharmacist Clinical Goal(s): o Over the next 90 days, patient will work with PharmD and providers to achieve LDL goal < 70 . Current regimen:  . Atorvastatin 20 mg daily . Fenofibrate 160 mg daily  . Interventions: o Discussed lifestyle modifications and weight loss goal.  o Encouraged activity goal of 30 minutes each day activity.  . Patient self care activities - Over the next 90 days, patient will: o Continue taking medication as prescribed.  o Contact pharmacist or provider with any questions or concerns.   Diabetes Lab Results  Component Value Date/Time   HGBA1C 6.0 (H) 12/19/2019 09:35 AM   HGBA1C 5.5 09/12/2019 10:22 AM   . Pharmacist Clinical Goal(s): o Over the next 90 days, patient will work with PharmD and providers to maintain A1c goal <7% . Current regimen:  o Victoza 1.8 mg daily  o Metformin 500 mg daily with morning meal . Interventions: o Reviewed home blood sugar readings.  o Discussed healthy eating and activity.  o Recommend considering Ozempic 0.5 mg weekly.   . Patient self care activities - Over the next 90 days, patient will: o Check blood sugar once daily, document, and provide at future appointments o Contact provider with any episodes of hypoglycemia  Depression . Pharmacist Clinical Goal(s) o Over the next 90 days, patient will work with PharmD and providers to manage symptoms of depression  . Current regimen:  o Trintellix 20 mg daily o Amitriptyline 50 mg daily . Interventions: o Patient reports goal of decreasing amitriptyline to 25 mg daily. Tablets are difficult to cut in half. Recommend sending prescription as Amitriptyline 25 mg - 2 tablets daily to allow tapering down in therapy.  o Patient's husband is having surgery next week and she will be responsible for his care. She is concerned that Trintellix won't be enough during this stressful time. Patient is at the max dose of Trintellix currently.  . Patient self care activities - Over the next 90 days, patient will: o Work to reduce dose of amitriptyline as directed.  o Consider counseling for symptoms of depression.  o  Contact pharmacist or provider with any questions or concerns.   Medication management . Pharmacist Clinical Goal(s): o Over the next 90 days, patient will work with PharmD and providers to achieve optimal medication adherence . Current pharmacy: [facility]  . Interventions o Comprehensive medication review performed. o Utilize [facility] for medication synchronization, packaging and delivery . Patient self care activities - Over the next 90 days, patient will: o Focus on medication adherence by pill box o Take medications as prescribed o Report any questions or concerns to PharmD and/or provider(s)  Initial goal documentation        COPD / Asthma / Tobacco   Eosinophil count:   Lab Results  Component Value Date/Time   EOSPCT 3 07/21/2019 05:29 AM  %                               Eos (Absolute):  Lab Results  Component Value Date/Time    EOSABS 0.1 12/19/2019 09:35 AM    Tobacco Status:  Social History   Tobacco Use  Smoking Status Former Smoker  . Packs/day: 2.00  . Years: 12.00  . Pack years: 24.00  . Types: Cigarettes  . Quit date: 05/25/2010  . Years since quitting: 9.6  Smokeless Tobacco Never Used    Patient has failed these meds in past: none reported  Patient is currently controlled on the following medications:   Flonase nasal spray 2 sprays in both nostrils daily  Loratadine 10 mg daily  Symbicort 160-4.5 mcg/act 2 puffs twice daily   Albuterol prn inhaler use  Using maintenance inhaler regularly? Yes Frequency of rescue inhaler use:  daily  We discussed:  proper inhaler technique. Patient reports symptoms well controlled with current therapy.   Plan  Continue current medications   Diabetes   Recent Relevant Labs: Lab Results  Component Value Date/Time   HGBA1C 6.0 (H) 12/19/2019 09:35 AM   HGBA1C 5.5 09/12/2019 10:22 AM   MICROALBUR 30 05/31/2019 11:19 AM    Kidney Function Lab Results  Component Value Date/Time   CREATININE 0.84 12/19/2019 09:35 AM   CREATININE 0.85 09/12/2019 10:22 AM   GFRNONAA 72 12/19/2019 09:35 AM   GFRAA 83 12/19/2019 09:35 AM   K 4.7 12/19/2019 09:35 AM   K 5.0 09/12/2019 10:22 AM     Checking BG: Daily  Recent FBG Readings: Recent pre-meal BG readings: 112-116 mg/dL  Patient has failed these meds in past: none reported Patient is currently controlled on the following medications:   Victoza 1.8 mg daily  Metformin 500 mg daily with morning meal  One Touch Verio   One touch Delica lancets Last diabetic Foot exam: 12/2019 Last diabetic Eye exam: 12/2019  We discussed: diet and exercise extensively. Patient reports good blood sugar control. We discussed the option of changing Victoza daily to a  once weekly Ozempic for maintained blood sugar control and continued assistance with weight loss. Patient is interested in the   Diet: Patient  is working to improve diet. She is working on a goal of weight loss.  Exercise: Patient is currently walking or completing 10 minutes of activity daily. She hopes to slowly increase to 30 minutes tolerated each day.   Plan  Consider changing daily Victoza to once weekly Ozempic 0.5 mg weekly.  Hypertension   BP today is:  <130/80  Office blood pressures are  BP Readings from Last 3 Encounters:  01/16/20 122/68  12/19/19 124/70  09/12/19 114/60   Kidney Function Lab Results  Component Value Date/Time   CREATININE 0.84 12/19/2019 09:35 AM   CREATININE 0.85 09/12/2019 10:22 AM   GFRNONAA 72 12/19/2019 09:35 AM   GFRAA 83 12/19/2019 09:35 AM   K 4.7 12/19/2019 09:35 AM   K 5.0 09/12/2019 10:22 AM     Patient has failed these meds in the past: none rported Patient is currently controlled on the following medications:   Hydrochlorothiazide 25 mg daily   Patient checks BP at home several times per month  Patient home BP readings are ranging: 120/80  We discussed diet and exercise extensively.   Plan  Consider adding ace inhibitor for renal protection.   Hyperlipidemia   LDL goal < 70  Lipid Panel     Component Value Date/Time   CHOL 150 12/19/2019 0935   TRIG 136 12/19/2019 0935   HDL 49 12/19/2019 0935   LDLCALC 77 12/19/2019 0935    Hepatic Function Latest Ref Rng & Units 12/19/2019 09/12/2019 08/09/2019  Total Protein 6.0 - 8.5 g/dL 6.5 7.2 7.6  Albumin 3.8 - 4.8 g/dL 4.3 3.9 4.1  AST 0 - 40 IU/L '12 10 17  ' ALT 0 - 32 IU/L '12 8 14  ' Alk Phosphatase 44 - 121 IU/L 128(H) 132(H) 106  Total Bilirubin 0.0 - 1.2 mg/dL 0.2 <0.2 1.0     The 10-year ASCVD risk score Mikey Bussing DC Jr., et al., 2013) is: 16%   Values used to calculate the score:     Age: 31 years     Sex: Female     Is Non-Hispanic African American: No     Diabetic: Yes     Tobacco smoker: No     Systolic Blood Pressure: 757 mmHg     Is BP treated: Yes     HDL Cholesterol: 49 mg/dL     Total  Cholesterol: 150 mg/dL   Patient has failed these meds in past: none reported Patient is currently uncontrolled on the following medications:  . Atorvastatin 20 mg daily . Fenofibrate 160 mg daily   We discussed:  diet and exercise extensively. Discussed diet and exercise during visit. Discussed medication delivery to improve access to medication. Will review next lipid panel to assess need for increased dose.   Plan  Continue current medications   Osteoarthritis   Patient has failed these meds in past: meloxicam, naproxen, oxycodone, tramadol  Patient is currently controlled on the following medications:  . Acetaminophen 500 mg - 1-2 tablets by mouth every 6 hours prn pain . Aspirin EC 325 mg bid  We discussed:  Patient reports osteoarthritis pain is well controlled with current therapy since knee replacement. .   Plan  Continue current medications  Insomnia   Patient has failed these meds in past: mirtazapine, nortriptyline Patient is currently controlled on the following medications:  . Belsomra 10 mg daily prn  We discussed:  Patient states she is trying to avoid taking this medication and takes very rarely. Patient states she sleeps well (8-10 hours of sleep). She has some nights without interruption for bathroom trips but occasionally gets up 1-2 times to urinate.   Plan  Continue current medications  Major Depression Disorder   Patient has failed these meds in past: bupropion, citalopram, desvenlafaxine, duloxetine, mirtazapine, nortriptyline Patient is currently controlled on the following medications:  . Amitriptyline 50 mg daily at bedtime . Trintellix 20 mg daily   We discussed:  Patient reports good control at this time but  has some concerns with her husband's upcoming surgery and recover. Pharmacist counseled patient to contact pharmacist or provider  if her symptoms of depression worsen.   Patient states that she is working to reduce dose of amitriptyline  but tablets disintegrate when cutting in half. Pharmacist is requesting prescription for 25 mg sent to pharmacy.   Plan  Continue current medications  GERD   Patient has failed these meds in past: none reported Patient is currently controlled on the following medications:  . Omeprazole 20 mg daily   We discussed:  Patient reports being out of omeprazole currently. She is experiencing symptoms of acid reflux. Pharmacist coordinating refill of omeprazole. Discussed lifestyle modifications of elevating head of bed, avoiding tight waist bands and frequent small meals.   Plan  Continue current medications   Urge Incontienance   Patient has failed these meds in past: Enablex Patient is currently controlled on the following medications:  . Vesicare 5 mg daily   We discussed:  Patient reports good control of bladder. Sleeps well and doesn't have to get up every night to urinate. Gets up 1-2 times per night.   Plan  Continue current medications  Restless Leg Syndrome   Patient has failed these meds in past: tizanidine Patient is currently controlled on the following medications:  . Ropinirole 3 mg 1 tablet 1-3 hours before bedtime  We discussed:  Patient reports good control of symptoms at this time. Sleep is not interrupted due to legs.   Plan  Continue current medications  Neuropathy   Patient has failed these meds in past: none reported Patient is currently controlled on the following medications:  . Lyrica 200 mg bid  We discussed:  Patient reports symptoms of neuropathy well controlled. She is hoping to resume driving. She has not been driving previously due to inability to feel feet. Reports that she can now feel her feet.   Plan  Continue current medications   Osteopenia / Osteoporosis   Last DEXA Scan: 04/2019  T-Score femoral neck: -0.8  T-Score lumbar spine: 0.4     No results found for: VD25OH   Patient is not a candidate for pharmacologic  treatment  Patient has failed these meds in past: none reported Patient is currently controlled on the following medications:  . None reported  We discussed:  Recommend [phone] units of vitamin D daily. Recommend 1200 mg of calcium daily from dietary and supplemental sources. Recommend weight-bearing and muscle strengthening exercises for building and maintaining bone density.   Patient reports eating cottage cheese often and enjoys dark leafy greens. Encouraged patient to read food labels to determine how much calcium she gets from her diet most days.   Recommend patient consider supplementation with calcium if diet not consistent with calcium intake. Discussed benefits of Vitamin D supplementation. Patient plans to order with next order of supplements from [employer].   Plan  Continue current medications and control with diet and exercise. Consider Vitamin D 1000 unit supplement.    Health Maintenance   Patient is currently controlled on the following medications:   Supplements . Vitamin B-12 1000 mcg daily supplementation . Multiple Vitamin daily - supplementation PRN Medications . Ondansetron 4 mg every 8 hours prn nausea . Dicyclomine 20 mg every 6 hours prn - stomach pain . Flexeril 10 mg tid prn - muscle pain  We discussed:  Patient states well controlled. Rarely using prn medications.   Plan  Continue current medications  Vaccines   Reviewed and discussed  patient's vaccination history.Patient reports both pneumonia vaccines received at pharmacy. Reports receiving both Shingrix vaccines. She thinks the pharmacy completed her Tetanus shot as well.   Immunization History  Administered Date(s) Administered  . Fluad Quad(high Dose 65+) 12/19/2019  . PFIZER SARS-COV-2 Vaccination 05/27/2019, 06/17/2019, 12/19/2019  . Zoster Recombinat (Shingrix) 04/04/2018    Plan  Recommended patient receive annual flu vaccine in office.   Medication Management   Pt uses  [facility] for all medications Uses pill box? Yes Pt endorses good compliance  We discussed: Verbal consent obtained for [facility] enhanced pharmacy services (medication synchronization, adherence packaging, delivery coordination). A medication sync plan was created to allow patient to get all medications delivered once every 30 to 90 days per patient preference. Patient understands they have freedom to choose pharmacy and clinical pharmacist will coordinate care between all prescribers and [facility].   Plan  Utilize [facility] for medication synchronization, packaging and delivery    Follow up: 1 month phone visit

## 2020-01-21 ENCOUNTER — Other Ambulatory Visit: Payer: Self-pay

## 2020-01-21 ENCOUNTER — Ambulatory Visit: Payer: 59

## 2020-01-21 DIAGNOSIS — E782 Mixed hyperlipidemia: Secondary | ICD-10-CM

## 2020-01-21 DIAGNOSIS — E1142 Type 2 diabetes mellitus with diabetic polyneuropathy: Secondary | ICD-10-CM

## 2020-01-21 DIAGNOSIS — I10 Essential (primary) hypertension: Secondary | ICD-10-CM

## 2020-01-21 NOTE — Patient Instructions (Addendum)
Visit Information  Thank you for your time discussing your medications. I look forward to working with you to achieve your health care goals. Below is a summary of what we talked about during our visit.   Goals Addressed            This Visit's Progress   . Pharmacy Care Plan       CARE PLAN ENTRY (see longitudinal plan of care for additional care plan information)  Current Barriers:  . Chronic Disease Management support, education, and care coordination needs related to Hypertension, Hyperlipidemia, Diabetes, and COPD   Hypertension BP Readings from Last 3 Encounters:  01/16/20 122/68  12/19/19 124/70  09/12/19 114/60   . Pharmacist Clinical Goal(s): o Over the next 90 days, patient will work with PharmD and providers to maintain BP goal <130/80 . Current regimen:  o Hydrochlorothiazide 25 mg daily . Interventions: o Discussed increasing exercise as tolerated to goal of 30 minutes each day. Patient currently around 10 minutes daily.  . Patient self care activities - Over the next 90 days, patient will: o Check BP weekly, document, and provide at future appointments o Ensure daily salt intake < 2300 mg/day  Hyperlipidemia Lab Results  Component Value Date/Time   LDLCALC 77 12/19/2019 09:35 AM   . Pharmacist Clinical Goal(s): o Over the next 90 days, patient will work with PharmD and providers to achieve LDL goal < 70 . Current regimen:  . Atorvastatin 20 mg daily . Fenofibrate 160 mg daily  . Interventions: o Discussed lifestyle modifications and weight loss goal.  o Encouraged activity goal of 30 minutes each day activity.  . Patient self care activities - Over the next 90 days, patient will: o Continue taking medication as prescribed.  o Contact pharmacist or provider with any questions or concerns.   Diabetes Lab Results  Component Value Date/Time   HGBA1C 6.0 (H) 12/19/2019 09:35 AM   HGBA1C 5.5 09/12/2019 10:22 AM   . Pharmacist Clinical Goal(s): o Over  the next 90 days, patient will work with PharmD and providers to maintain A1c goal <7% . Current regimen:  o Victoza 1.8 mg daily  o Metformin 500 mg daily with morning meal . Interventions: o Reviewed home blood sugar readings.  o Discussed healthy eating and activity.  o Recommend considering Ozempic 0.5 mg weekly.  . Patient self care activities - Over the next 90 days, patient will: o Check blood sugar once daily, document, and provide at future appointments o Contact provider with any episodes of hypoglycemia  Depression . Pharmacist Clinical Goal(s) o Over the next 90 days, patient will work with PharmD and providers to manage symptoms of depression  . Current regimen:  o Trintellix 20 mg daily o Amitriptyline 50 mg daily . Interventions: o Patient reports goal of decreasing amitriptyline to 25 mg daily. Tablets are difficult to cut in half. Recommend sending prescription as Amitriptyline 25 mg - 2 tablets daily to allow tapering down in therapy.  o Patient's husband is having surgery next week and she will be responsible for his care. She is concerned that Trintellix won't be enough during this stressful time. Patient is at the max dose of Trintellix currently.  . Patient self care activities - Over the next 90 days, patient will: o Work to reduce dose of amitriptyline as directed.  o Consider counseling for symptoms of depression.  o Contact pharmacist or provider with any questions or concerns.   Medication management . Pharmacist Clinical Goal(s): o  Over the next 90 days, patient will work with PharmD and providers to achieve optimal medication adherence . Current pharmacy: [facility]  . Interventions o Comprehensive medication review performed. o Utilize [facility] for medication synchronization, packaging and delivery . Patient self care activities - Over the next 90 days, patient will: o Focus on medication adherence by pill box o Take medications as  prescribed o Report any questions or concerns to PharmD and/or provider(s)  Initial goal documentation        Hailey Greene was given information about Chronic Care Management services today including:  1. CCM service includes personalized support from designated clinical staff supervised by her physician, including individualized plan of care and coordination with other care providers 2. 24/7 contact phone numbers for assistance for urgent and routine care needs. 3. Standard insurance, coinsurance, copays and deductibles apply for chronic care management only during months in which we provide at least 20 minutes of these services. Most insurances cover these services at 100%, however patients may be responsible for any copay, coinsurance and/or deductible if applicable. This service may help you avoid the need for more expensive face-to-face services. 4. Only one practitioner may furnish and bill the service in a calendar month. 5. The patient may stop CCM services at any time (effective at the end of the month) by phone call to the office staff.  Patient agreed to services and verbal consent obtained.   The patient verbalized understanding of instructions provided today and agreed to receive a mailed copy of patient instruction and/or educational materials. Telephone follow up appointment with pharmacy team member scheduled for:01/2020  Juliane Lack, PharmD Clinical Pharmacist [facility] [phone] (office) [phone] (mobile)  DASH Eating Plan DASH stands for "Dietary Approaches to Stop Hypertension." The DASH eating plan is a healthy eating plan that has been shown to reduce high blood pressure (hypertension). It may also reduce your risk for type 2 diabetes, heart disease, and stroke. The DASH eating plan may also help with weight loss. What are tips for following this plan?  General guidelines  Avoid eating more than 2,300 mg (milligrams) of salt (sodium) a day. If  you have hypertension, you may need to reduce your sodium intake to 1,500 mg a day.  Limit alcohol intake to no more than 1 drink a day for nonpregnant women and 2 drinks a day for men. One drink equals 12 oz of beer, 5 oz of wine, or 1 oz of hard liquor.  Work with your health care provider to maintain a healthy body weight or to lose weight. Ask what an ideal weight is for you.  Get at least 30 minutes of exercise that causes your heart to beat faster (aerobic exercise) most days of the week. Activities may include walking, swimming, or biking.  Work with your health care provider or diet and nutrition specialist (dietitian) to adjust your eating plan to your individual calorie needs. Reading food labels   Check food labels for the amount of sodium per serving. Choose foods with less than 5 percent of the Daily Value of sodium. Generally, foods with less than 300 mg of sodium per serving fit into this eating plan.  To find whole grains, look for the word "whole" as the first word in the ingredient list. Shopping  Buy products labeled as "low-sodium" or "no salt added."  Buy fresh foods. Avoid canned foods and premade or frozen meals. Cooking  Avoid adding salt when cooking. Use salt-free seasonings or  herbs instead of table salt or sea salt. Check with your health care provider or pharmacist before using salt substitutes.  Do not fry foods. Cook foods using healthy methods such as baking, boiling, grilling, and broiling instead.  Cook with heart-healthy oils, such as olive, canola, soybean, or sunflower oil. Meal planning  Eat a balanced diet that includes: ? 5 or more servings of fruits and vegetables each day. At each meal, try to fill half of your plate with fruits and vegetables. ? Up to 6-8 servings of whole grains each day. ? Less than 6 oz of lean meat, poultry, or fish each day. A 3-oz serving of meat is about the same size as a deck of cards. One egg equals 1 oz. ? 2  servings of low-fat dairy each day. ? A serving of nuts, seeds, or beans 5 times each week. ? Heart-healthy fats. Healthy fats called Omega-3 fatty acids are found in foods such as flaxseeds and coldwater fish, like sardines, salmon, and mackerel.  Limit how much you eat of the following: ? Canned or prepackaged foods. ? Food that is high in trans fat, such as fried foods. ? Food that is high in saturated fat, such as fatty meat. ? Sweets, desserts, sugary drinks, and other foods with added sugar. ? Full-fat dairy products.  Do not salt foods before eating.  Try to eat at least 2 vegetarian meals each week.  Eat more home-cooked food and less restaurant, buffet, and fast food.  When eating at a restaurant, ask that your food be prepared with less salt or no salt, if possible. What foods are recommended? The items listed may not be a complete list. Talk with your dietitian about what dietary choices are best for you. Grains Whole-grain or whole-wheat bread. Whole-grain or whole-wheat pasta. [place] Bunton rice. Orpah Cobb. Bulgur. Whole-grain and low-sodium cereals. Pita bread. Low-fat, low-sodium crackers. Whole-wheat flour tortillas. Vegetables Fresh or frozen vegetables (raw, steamed, roasted, or grilled). Low-sodium or reduced-sodium tomato and vegetable juice. Low-sodium or reduced-sodium tomato sauce and tomato paste. Low-sodium or reduced-sodium canned vegetables. Fruits All fresh, dried, or frozen fruit. Canned fruit in natural juice (without added sugar). Meat and other protein foods Skinless chicken or [place]. Ground chicken or [place]. Pork with fat trimmed off. Fish and seafood. Egg whites. Dried beans, peas, or lentils. Unsalted nuts, nut butters, and seeds. Unsalted canned beans. Lean cuts of beef with fat trimmed off. Low-sodium, lean deli meat. Dairy Low-fat (1%) or fat-free (skim) milk. Fat-free, low-fat, or reduced-fat cheeses. Nonfat, low-sodium ricotta or cottage cheese.  Low-fat or nonfat yogurt. Low-fat, low-sodium cheese. Fats and oils Soft margarine without trans fats. Vegetable oil. Low-fat, reduced-fat, or light mayonnaise and salad dressings (reduced-sodium). Canola, safflower, olive, soybean, and sunflower oils. Avocado. Seasoning and other foods Herbs. Spices. Seasoning mixes without salt. Unsalted popcorn and pretzels. Fat-free sweets. What foods are not recommended? The items listed may not be a complete list. Talk with your dietitian about what dietary choices are best for you. Grains Baked goods made with fat, such as croissants, muffins, or some breads. Dry pasta or rice meal packs. Vegetables Creamed or fried vegetables. Vegetables in a cheese sauce. Regular canned vegetables (not low-sodium or reduced-sodium). Regular canned tomato sauce and paste (not low-sodium or reduced-sodium). Regular tomato and vegetable juice (not low-sodium or reduced-sodium). Rosita Fire. Olives. Fruits Canned fruit in a light or heavy syrup. Fried fruit. Fruit in cream or butter sauce. Meat and other protein foods Fatty cuts of meat. Ribs.  Fried meat. Tomasa BlaseBacon. Sausage. Bologna and other processed lunch meats. Salami. Fatback. Hotdogs. Bratwurst. Salted nuts and seeds. Canned beans with added salt. Canned or smoked fish. Whole eggs or egg yolks. Chicken or [place] with skin. Dairy Whole or 2% milk, cream, and half-and-half. Whole or full-fat cream cheese. Whole-fat or sweetened yogurt. Full-fat cheese. Nondairy creamers. Whipped toppings. Processed cheese and cheese spreads. Fats and oils Butter. Stick margarine. Lard. Shortening. Ghee. Bacon fat. Tropical oils, such as coconut, palm kernel, or palm oil. Seasoning and other foods Salted popcorn and pretzels. Onion salt, garlic salt, seasoned salt, table salt, and sea salt. Worcestershire sauce. Tartar sauce. Barbecue sauce. Teriyaki sauce. Soy sauce, including reduced-sodium. Steak sauce. Canned and packaged gravies. Fish sauce.  Oyster sauce. Cocktail sauce. Horseradish that you find on the shelf. Ketchup. Mustard. Meat flavorings and tenderizers. Bouillon cubes. Hot sauce and Tabasco sauce. Premade or packaged marinades. Premade or packaged taco seasonings. Relishes. Regular salad dressings. Where to find more information:  National Heart, Lung, and Blood Institute: PopSteam.iswww.nhlbi.nih.gov  American Heart Association: www.heart.org Summary  The DASH eating plan is a healthy eating plan that has been shown to reduce high blood pressure (hypertension). It may also reduce your risk for type 2 diabetes, heart disease, and stroke.  With the DASH eating plan, you should limit salt (sodium) intake to 2,300 mg a day. If you have hypertension, you may need to reduce your sodium intake to 1,500 mg a day.  When on the DASH eating plan, aim to eat more fresh fruits and vegetables, whole grains, lean proteins, low-fat dairy, and heart-healthy fats.  Work with your health care provider or diet and nutrition specialist (dietitian) to adjust your eating plan to your individual calorie needs. This information is not intended to replace advice given to you by your health care provider. Make sure you discuss any questions you have with your health care provider. Document Revised: 02/24/2017 Document Reviewed: 03/07/2016 Elsevier Patient Education  2020 [employer]

## 2020-01-22 ENCOUNTER — Other Ambulatory Visit: Payer: Self-pay

## 2020-01-22 DIAGNOSIS — F332 Major depressive disorder, recurrent severe without psychotic features: Secondary | ICD-10-CM

## 2020-01-22 DIAGNOSIS — F330 Major depressive disorder, recurrent, mild: Secondary | ICD-10-CM

## 2020-01-22 DIAGNOSIS — E1142 Type 2 diabetes mellitus with diabetic polyneuropathy: Secondary | ICD-10-CM

## 2020-01-22 MED ORDER — BELSOMRA 10 MG PO TABS
ORAL_TABLET | ORAL | 1 refills | Status: DC
Start: 2020-01-22 — End: 2020-02-05

## 2020-01-22 MED ORDER — AMITRIPTYLINE HCL 50 MG PO TABS: 50.0000 mg | ORAL_TABLET | Freq: Every day | ORAL | 1 refills | Status: DC

## 2020-01-22 MED ORDER — DICYCLOMINE HCL 20 MG PO TABS
ORAL_TABLET | ORAL | 1 refills | Status: DC
Start: 2020-01-22 — End: 2020-03-24

## 2020-01-22 MED ORDER — VORTIOXETINE HBR 20 MG PO TABS: 20.0000 mg | ORAL_TABLET | Freq: Every day | ORAL | 1 refills | Status: DC

## 2020-01-22 MED ORDER — SYMBICORT 160-4.5 MCG/ACT IN AERO
2.0000 | INHALATION_SPRAY | Freq: Two times a day (BID) | RESPIRATORY_TRACT | 5 refills | Status: DC
Start: 2020-01-22 — End: 2020-12-22

## 2020-01-22 MED ORDER — SOLIFENACIN SUCCINATE 5 MG PO TABS
5.0000 mg | ORAL_TABLET | Freq: Every day | ORAL | 1 refills | Status: DC
Start: 2020-01-22 — End: 2020-06-13

## 2020-01-22 MED ORDER — METFORMIN HCL 500 MG PO TABS
ORAL_TABLET | ORAL | 1 refills | Status: DC
Start: 2020-01-22 — End: 2020-06-24

## 2020-01-22 MED ORDER — HYDROCHLOROTHIAZIDE 25 MG PO TABS
ORAL_TABLET | ORAL | 1 refills | Status: DC
Start: 2020-01-22 — End: 2020-03-24

## 2020-01-22 MED ORDER — ATORVASTATIN CALCIUM 20 MG PO TABS
ORAL_TABLET | ORAL | 1 refills | Status: DC
Start: 2020-01-22 — End: 2020-03-24

## 2020-01-22 MED ORDER — FLUTICASONE PROPIONATE 50 MCG/ACT NA SUSP
2.0000 | Freq: Every day | NASAL | 6 refills | Status: DC
Start: 2020-01-22 — End: 2020-03-22

## 2020-01-22 MED ORDER — ROPINIROLE HCL 3 MG PO TABS
ORAL_TABLET | ORAL | 1 refills | Status: DC
Start: 2020-01-22 — End: 2020-06-13

## 2020-01-22 MED ORDER — OMEPRAZOLE 20 MG PO CPDR
20.0000 mg | DELAYED_RELEASE_CAPSULE | Freq: Every day | ORAL | 3 refills | Status: DC
Start: 2020-01-22 — End: 2020-03-24

## 2020-01-22 MED ORDER — CYCLOBENZAPRINE HCL 10 MG PO TABS
ORAL_TABLET | ORAL | 1 refills | Status: DC
Start: 2020-01-22 — End: 2020-09-18

## 2020-01-22 MED ORDER — LORATADINE 10 MG PO TABS
ORAL_TABLET | ORAL | 3 refills | Status: DC
Start: 2020-01-22 — End: 2020-03-24

## 2020-01-22 MED ORDER — PREGABALIN 200 MG PO CAPS: 200.0000 mg | ORAL_CAPSULE | Freq: Two times a day (BID) | ORAL | 1 refills | Status: DC

## 2020-01-22 MED ORDER — ONETOUCH DELICA PLUS LANCET33G MISC: 2 refills | Status: DC

## 2020-01-22 MED ORDER — FENOFIBRATE 160 MG PO TABS
160.0000 mg | ORAL_TABLET | Freq: Every day | ORAL | 1 refills | Status: DC
Start: 2020-01-22 — End: 2020-02-05

## 2020-01-22 MED ORDER — ONDANSETRON HCL 4 MG PO TABS: ORAL_TABLET | ORAL | 1 refills | Status: DC

## 2020-01-22 MED ORDER — ONETOUCH VERIO VI STRP: ORAL_STRIP | 2 refills | Status: DC

## 2020-02-04 ENCOUNTER — Other Ambulatory Visit: Payer: Self-pay | Admitting: Family Medicine

## 2020-02-04 MED ORDER — AMITRIPTYLINE HCL 25 MG PO TABS: 25.0000 mg | ORAL_TABLET | Freq: Two times a day (BID) | ORAL | 2 refills | Status: DC | PRN

## 2020-02-04 MED ORDER — OZEMPIC (0.25 OR 0.5 MG/DOSE) 2 MG/1.5ML ~~LOC~~ SOPN: PEN_INJECTOR | SUBCUTANEOUS | 1 refills | Status: DC

## 2020-02-04 MED ORDER — RAMIPRIL 2.5 MG PO CAPS: 2.5000 mg | ORAL_CAPSULE | Freq: Every day | ORAL | 2 refills | Status: DC

## 2020-02-04 NOTE — Progress Notes (Signed)
Discontinue victoza. Start on ozempic 0.25 mg once weekly.  Start on ramipril 2.5 mg once weekly.  Change amitriptyline 50 mg one at night then change to 25 mg 1-2 qhs.

## 2020-02-05 ENCOUNTER — Other Ambulatory Visit: Payer: Self-pay

## 2020-02-06 MED ORDER — FENOFIBRATE 160 MG PO TABS: 160.0000 mg | ORAL_TABLET | Freq: Every day | ORAL | 1 refills | Status: DC

## 2020-02-06 MED ORDER — BELSOMRA 10 MG PO TABS: ORAL_TABLET | ORAL | 1 refills | Status: DC

## 2020-02-12 ENCOUNTER — Other Ambulatory Visit: Payer: Self-pay

## 2020-02-12 ENCOUNTER — Ambulatory Visit: Payer: 59

## 2020-02-12 DIAGNOSIS — E1142 Type 2 diabetes mellitus with diabetic polyneuropathy: Secondary | ICD-10-CM

## 2020-02-12 DIAGNOSIS — I10 Essential (primary) hypertension: Secondary | ICD-10-CM

## 2020-02-12 DIAGNOSIS — E782 Mixed hyperlipidemia: Secondary | ICD-10-CM

## 2020-02-12 NOTE — Patient Instructions (Addendum)
Visit Information  Goals Addressed            This Visit's Progress   . Pharmacy Care Plan       CARE PLAN ENTRY (see longitudinal plan of care for additional care plan information)  Current Barriers:  . Chronic Disease Management support, education, and care coordination needs related to Hypertension, Hyperlipidemia, Diabetes, and COPD   Hypertension BP Readings from Last 3 Encounters:  01/16/20 122/68  12/19/19 124/70  09/12/19 114/60   . Pharmacist Clinical Goal(s): o Over the next 90 days, patient will work with PharmD and providers to maintain BP goal <130/80 . Current regimen:  o Hydrochlorothiazide 25 mg daily o Ramipril 2.5 mg daily  . Interventions: o Discussed increasing exercise as tolerated to goal of 30 minutes each day. Patient currently around 20 minutes daily.  . Patient self care activities - Over the next 90 days, patient will: o Check BP weekly, document, and provide at future appointments o Ensure daily salt intake < 2300 mg/day  Hyperlipidemia Lab Results  Component Value Date/Time   LDLCALC 77 12/19/2019 09:35 AM   . Pharmacist Clinical Goal(s): o Over the next 90 days, patient will work with PharmD and providers to achieve LDL goal < 70 . Current regimen:  . Atorvastatin 20 mg daily . Fenofibrate 160 mg daily  . Interventions: o Discussed lifestyle modifications and weight loss goal.  o Encouraged activity goal of 30 minutes each day activity.  . Patient self care activities - Over the next 90 days, patient will: o Continue taking medication as prescribed.  o Contact pharmacist or provider with any questions or concerns.   Diabetes Lab Results  Component Value Date/Time   HGBA1C 6.0 (H) 12/19/2019 09:35 AM   HGBA1C 5.5 09/12/2019 10:22 AM   . Pharmacist Clinical Goal(s): o Over the next 90 days, patient will work with PharmD and providers to maintain A1c goal <7% . Current regimen:  o Ozempic 0.5 mg weekly o Metformin 500 mg daily with  morning meal . Interventions: o Reviewed home blood sugar readings.  o Discussed healthy eating and activity.  o Patient will begin Ozempic 0.5 mg weekly this Sunday.  . Patient self care activities - Over the next 90 days, patient will: o Check blood sugar once daily, document, and provide at future appointments o Contact provider with any episodes of hypoglycemia  Depression . Pharmacist Clinical Goal(s) o Over the next 90 days, patient will work with PharmD and providers to manage symptoms of depression  . Current regimen:  o Trintellix 20 mg daily o Amitriptyline 25 mg daily . Interventions: o Patient reports taking Amitriptyline 25 mg daily currently. She hopes to taper off medication as tolerated. We discussed cutting dose in half as tolerated.   o Patient reports increasing walking to 20 minutes daily currently.   . Patient self care activities - Over the next 90 days, patient will: o Work to reduce dose of amitriptyline as directed.  o Consider counseling for symptoms of depression.  o Contact pharmacist or provider with any questions or concerns.   Medication management . Pharmacist Clinical Goal(s): o Over the next 90 days, patient will work with PharmD and providers to achieve optimal medication adherence . Current pharmacy: [facility] . Interventions o Comprehensive medication review performed. o Utilize [facility] pharmacy for medication synchronization, packaging and delivery . Patient self care activities - Over the next 90 days, patient will: o Focus on medication adherence by pill box o Take  medications as prescribed o Report any questions or concerns to PharmD and/or provider(s)  Please see past updates related to this goal by clicking on the "Past Updates" button in the selected goal         The patient verbalized understanding of instructions, educational materials, and care plan provided today and declined offer to receive copy of patient  instructions, educational materials, and care plan.   Face to Face appointment with pharmacist scheduled for: 02/2020  Juliane Lack, PharmD, [facility] Clinical Pharmacist [facility] [phone] (office) [phone] (mobile)   Carbohydrate Counting for Diabetes Mellitus, Adult  Carbohydrate counting is a method of keeping track of how many carbohydrates you eat. Eating carbohydrates naturally increases the amount of sugar (glucose) in the blood. Counting how many carbohydrates you eat helps keep your blood glucose within normal limits, which helps you manage your diabetes (diabetes mellitus). It is important to know how many carbohydrates you can safely have in each meal. This is different for every person. A diet and nutrition specialist (registered dietitian) can help you make a meal plan and calculate how many carbohydrates you should have at each meal and snack. Carbohydrates are found in the following foods:  Grains, such as breads and cereals.  Dried beans and soy products.  Starchy vegetables, such as potatoes, peas, and corn.  Fruit and fruit juices.  Milk and yogurt.  Sweets and snack foods, such as cake, cookies, candy, chips, and soft drinks. How do I count carbohydrates? There are two ways to count carbohydrates in food. You can use either of the methods or a combination of both. Reading "Nutrition Facts" on packaged food The "Nutrition Facts" list is included on the labels of almost all packaged foods and beverages in the [place] It includes:  The serving size.  Information about nutrients in each serving, including the grams (g) of carbohydrate per serving. To use the "Nutrition Facts":  Decide how many servings you will have.  Multiply the number of servings by the number of carbohydrates per serving.  The resulting number is the total amount of carbohydrates that you will be having. Learning standard serving sizes of other foods When you eat  carbohydrate foods that are not packaged or do not include "Nutrition Facts" on the label, you need to measure the servings in order to count the amount of carbohydrates:  Measure the foods that you will eat with a food scale or measuring cup, if needed.  Decide how many standard-size servings you will eat.  Multiply the number of servings by 15. Most carbohydrate-rich foods have about 15 g of carbohydrates per serving. ? For example, if you eat 8 oz (170 g) of strawberries, you will have eaten 2 servings and 30 g of carbohydrates (2 servings x 15 g = 30 g).  For foods that have more than one food mixed, such as soups and casseroles, you must count the carbohydrates in each food that is included. The following list contains standard serving sizes of common carbohydrate-rich foods. Each of these servings has about 15 g of carbohydrates:   hamburger bun or  English muffin.   oz (15 mL) syrup.   oz (14 g) jelly.  1 slice of bread.  1 six-inch tortilla.  3 oz (85 g) cooked rice or pasta.  4 oz (113 g) cooked dried beans.  4 oz (113 g) starchy vegetable, such as peas, corn, or potatoes.  4 oz (113 g) hot cereal.  4 oz (113 g) mashed  potatoes or  of a large baked potato.  4 oz (113 g) canned or frozen fruit.  4 oz (120 mL) fruit juice.  4-6 crackers.  6 chicken nuggets.  6 oz (170 g) unsweetened dry cereal.  6 oz (170 g) plain fat-free yogurt or yogurt sweetened with artificial sweeteners.  8 oz (240 mL) milk.  8 oz (170 g) fresh fruit or one small piece of fruit.  24 oz (680 g) popped popcorn. Example of carbohydrate counting Sample meal  3 oz (85 g) chicken breast.  6 oz (170 g) Shakera Ebrahimi rice.  4 oz (113 g) corn.  8 oz (240 mL) milk.  8 oz (170 g) strawberries with sugar-free whipped topping. Carbohydrate calculation 1. Identify the foods that contain carbohydrates: ? Rice. ? Corn. ? Milk. ? Strawberries. 2. Calculate how many servings you have of each  food: ? 2 servings rice. ? 1 serving corn. ? 1 serving milk. ? 1 serving strawberries. 3. Multiply each number of servings by 15 g: ? 2 servings rice x 15 g = 30 g. ? 1 serving corn x 15 g = 15 g. ? 1 serving milk x 15 g = 15 g. ? 1 serving strawberries x 15 g = 15 g. 4. Add together all of the amounts to find the total grams of carbohydrates eaten: ? 30 g + 15 g + 15 g + 15 g = 75 g of carbohydrates total. Summary  Carbohydrate counting is a method of keeping track of how many carbohydrates you eat.  Eating carbohydrates naturally increases the amount of sugar (glucose) in the blood.  Counting how many carbohydrates you eat helps keep your blood glucose within normal limits, which helps you manage your diabetes.  A diet and nutrition specialist (registered dietitian) can help you make a meal plan and calculate how many carbohydrates you should have at each meal and snack. This information is not intended to replace advice given to you by your health care provider. Make sure you discuss any questions you have with your health care provider. Document Revised: 10/06/2016 Document Reviewed: 08/26/2015 Elsevier Patient Education  2020 [employer].

## 2020-02-12 NOTE — Chronic Care Management (AMB) (Signed)
Chronic Care Management Pharmacy  Name: Hailey Greene  MRN: [id number] DOB: 1950-08-18  Chief Complaint/ HPI  Hailey Greene,  69 y.o. , female presents for their Follow-Up CCM visit with the clinical pharmacist via telephone due to COVID-19 Pandemic.  PCP : Hailey Brome, MD  Their chronic conditions include: hypertension, asthma, type 2 diabetes, diabetic polyneuropathy, osteoarthritis of knee, major depressive disorder, depression, hyperlipidemia, insomnia.   Office Visits: 01/16/2020 - Increase Trintellix. Remain off Cymbalta. Stop Mirtazapine.  12/19/2019 - COVID vaccine. Increase Victoza to 1.8 mg daily. Trintellix 20 mg daily. Referral to psychology. Flu vaccine given.  09/12/2019 - continue Lyrica. D/C amitriptyline and mirtazapine due to difficulty losing weight. Begin tizanidine.  08/13/2019 - acute cystitis and depression - change Trintellix to Pristiq 50 mg daily.  08/07/2019 - video visit with Adron Bene for gastroenteritis - recommend clear liquids, bland diet, change zofran to promethazine. Continue prilosec as prescribed.  Consult Visit: 10/10/2019 - ortho visit - continue home exercise program.  08/22/2019 - ortho visit - doing well. Continue with PT.  08/09/2019 - ED visit for acute cystitis - keflex for 7 days and OD zofran.  08/08/2019 - ED visit waited 1 hour.  07/25/2019 - ortho visit - doing home health physical therapy.  07/19/2019 - ED to hospital admission - altered mental status after knee replacement.  Medications: Outpatient Encounter Medications as of 02/12/2020  Medication Sig  . acetaminophen (TYLENOL) 500 MG tablet Take 500-1,000 mg by mouth every 6 (six) hours as needed (for pain).  Marland Kitchen amitriptyline (ELAVIL) 25 MG tablet Take 1 tablet (25 mg total) by mouth 2 (two) times daily as needed (insomnia).  Marland Kitchen aspirin EC 325 MG EC tablet Take 1 tablet (325 mg total) by mouth 2 (two) times daily.  Marland Kitchen atorvastatin (LIPITOR) 20 MG tablet TAKE ONE (1)  TABLET ONCE DAILY  . cyclobenzaprine (FLEXERIL) 10 MG tablet TAKE 1 TABLET 3 TIMES A DAY AS NEEDED  . dicyclomine (BENTYL) 20 MG tablet TAKE ONE TABLET EVERY 6 HOURS AS NEEDED  . fenofibrate 160 MG tablet Take 1 tablet (160 mg total) by mouth daily.  . fluticasone (FLONASE) 50 MCG/ACT nasal spray Place 2 sprays into both nostrils daily.  Marland Kitchen glucose blood (ONETOUCH VERIO) test strip E11.42 Use new test strip each time when checking FBS  . hydrochlorothiazide (HYDRODIURIL) 25 MG tablet TAKE ONE (1) TABLET ONCE DAILY  . Lancets (ONETOUCH DELICA PLUS GTXMIW80H) MISC E 11.42 Use new lancet each time when checking FBS  . loratadine (ALLERGY RELIEF) 10 MG tablet TAKE ONE (1) TABLET BY MOUTH ONCE DAILY  . metFORMIN (GLUCOPHAGE) 500 MG tablet TAKE 1 TABLET ONCE DAILY WITH MORNING MEAL  . Multiple Vitamin (MULTIVITAMIN WITH MINERALS) TABS tablet Take 1 tablet by mouth daily.  Marland Kitchen omeprazole (PRILOSEC) 20 MG capsule Take 1 capsule (20 mg total) by mouth daily.  . ondansetron (ZOFRAN) 4 MG tablet TAKE ONE TABLET BY MOUTH EVERY EIGHT HOURS AS NEEDED NAUSEA  . pregabalin (LYRICA) 200 MG capsule Take 1 capsule (200 mg total) by mouth 2 (two) times daily.  . ramipril (ALTACE) 2.5 MG capsule Take 1 capsule (2.5 mg total) by mouth daily.  Marland Kitchen rOPINIRole (REQUIP) 3 MG tablet TAKE ONE (1) TABLET BY MOUTH ONCE DAILY (1-3 HOURS BEFORE BEDTIME)  . Semaglutide,0.25 or 0.5MG/DOS, (OZEMPIC, 0.25 OR 0.5 MG/DOSE,) 2 MG/1.5ML SOPN 0.25 mg weekly.  . solifenacin (VESICARE) 5 MG tablet Take 1 tablet (5 mg total) by mouth daily.  . Suvorexant ([place]) 10  MG TABS TAKE 1 TABLET BY MOUTH WITHIN 30 MINUTES OF BEDTIME. ONLY TAKE IF WITHIN 7 HOURS OF WAKING TIME  . SYMBICORT 160-4.5 MCG/ACT inhaler Inhale 2 puffs into the lungs 2 times daily at 12 noon and 4 pm.  . vitamin B-12 1000 MCG tablet Take 1 tablet (1,000 mcg total) by mouth daily.  Marland Kitchen vortioxetine HBr (TRINTELLIX) 20 MG TABS tablet Take 1 tablet (20 mg total) by mouth daily.    No facility-administered encounter medications on file as of 02/12/2020.     Current Diagnosis/Assessment:  Goals Addressed            This Visit's Progress   . Pharmacy Care Plan       CARE PLAN ENTRY (see longitudinal plan of care for additional care plan information)  Current Barriers:  . Chronic Disease Management support, education, and care coordination needs related to Hypertension, Hyperlipidemia, Diabetes, and COPD   Hypertension BP Readings from Last 3 Encounters:  01/16/20 122/68  12/19/19 124/70  09/12/19 114/60   . Pharmacist Clinical Goal(s): o Over the next 90 days, patient will work with PharmD and providers to maintain BP goal <130/80 . Current regimen:  o Hydrochlorothiazide 25 mg daily o Ramipril 2.5 mg daily  . Interventions: o Discussed increasing exercise as tolerated to goal of 30 minutes each day. Patient currently around 20 minutes daily.  . Patient self care activities - Over the next 90 days, patient will: o Check BP weekly, document, and provide at future appointments o Ensure daily salt intake < 2300 mg/day  Hyperlipidemia Lab Results  Component Value Date/Time   LDLCALC 77 12/19/2019 09:35 AM   . Pharmacist Clinical Goal(s): o Over the next 90 days, patient will work with PharmD and providers to achieve LDL goal < 70 . Current regimen:  . Atorvastatin 20 mg daily . Fenofibrate 160 mg daily  . Interventions: o Discussed lifestyle modifications and weight loss goal.  o Encouraged activity goal of 30 minutes each day activity.  . Patient self care activities - Over the next 90 days, patient will: o Continue taking medication as prescribed.  o Contact pharmacist or provider with any questions or concerns.   Diabetes Lab Results  Component Value Date/Time   HGBA1C 6.0 (H) 12/19/2019 09:35 AM   HGBA1C 5.5 09/12/2019 10:22 AM   . Pharmacist Clinical Goal(s): o Over the next 90 days, patient will work with PharmD and providers to  maintain A1c goal <7% . Current regimen:  o Ozempic 0.5 mg weekly o Metformin 500 mg daily with morning meal . Interventions: o Reviewed home blood sugar readings.  o Discussed healthy eating and activity.  o Patient will begin Ozempic 0.5 mg weekly this Sunday.  . Patient self care activities - Over the next 90 days, patient will: o Check blood sugar once daily, document, and provide at future appointments o Contact provider with any episodes of hypoglycemia  Depression . Pharmacist Clinical Goal(s) o Over the next 90 days, patient will work with PharmD and providers to manage symptoms of depression  . Current regimen:  o Trintellix 20 mg daily o Amitriptyline 25 mg daily . Interventions: o Patient reports taking Amitriptyline 25 mg daily currently. She hopes to taper off medication as tolerated. We discussed cutting dose in half as tolerated.   o Patient reports increasing walking to 20 minutes daily currently.   . Patient self care activities - Over the next 90 days, patient will: o Work to reduce dose of amitriptyline  as directed.  o Consider counseling for symptoms of depression.  o Contact pharmacist or provider with any questions or concerns.   Medication management . Pharmacist Clinical Goal(s): o Over the next 90 days, patient will work with PharmD and providers to achieve optimal medication adherence . Current pharmacy: [facility] . Interventions o Comprehensive medication review performed. o Utilize [facility] for medication synchronization, packaging and delivery . Patient self care activities - Over the next 90 days, patient will: o Focus on medication adherence by pill box o Take medications as prescribed o Report any questions or concerns to PharmD and/or provider(s)  Please see past updates related to this goal by clicking on the "Past Updates" button in the selected goal         COPD / Asthma / Tobacco   Eosinophil count:   Lab Results   Component Value Date/Time   EOSPCT 3 07/21/2019 05:29 AM  %                               Eos (Absolute):  Lab Results  Component Value Date/Time   EOSABS 0.1 12/19/2019 09:35 AM    Tobacco Status:  Social History   Tobacco Use  Smoking Status Former Smoker  . Packs/day: 2.00  . Years: 12.00  . Pack years: 24.00  . Types: Cigarettes  . Quit date: 05/25/2010  . Years since quitting: 9.7  Smokeless Tobacco Never Used    Patient has failed these meds in past: none reported  Patient is currently controlled on the following medications:   Flonase nasal spray 2 sprays in both nostrils daily  Loratadine 10 mg daily  Symbicort 160-4.5 mcg/act 2 puffs twice daily   Albuterol prn inhaler use  Using maintenance inhaler regularly? Yes Frequency of rescue inhaler use:  daily  We discussed:  proper inhaler technique. Patient reports symptoms well controlled with current therapy.   Plan  Continue current medications   Diabetes   Recent Relevant Labs: Lab Results  Component Value Date/Time   HGBA1C 6.0 (H) 12/19/2019 09:35 AM   HGBA1C 5.5 09/12/2019 10:22 AM   MICROALBUR 30 05/31/2019 11:19 AM    Kidney Function Lab Results  Component Value Date/Time   CREATININE 0.84 12/19/2019 09:35 AM   CREATININE 0.85 09/12/2019 10:22 AM   GFRNONAA 72 12/19/2019 09:35 AM   GFRAA 83 12/19/2019 09:35 AM   K 4.7 12/19/2019 09:35 AM   K 5.0 09/12/2019 10:22 AM     Checking BG: Daily  Recent FBG Readings: Recent pre-meal BG readings: highest lately has been 116 mg/dL  Patient has failed these meds in past: none reported Patient is currently controlled on the following medications:   Ozempic 0.5 mg weekly on Sundays  Metformin 500 mg daily with morning meal  One Touch Verio   One touch Delica lancets  Last diabetic Foot exam: 12/2019 Last diabetic Eye exam: 12/2019  We discussed: diet and exercise extensively.Patient will begin Ozempic 0.5 mg weekly this Sunday.    Diet: Patient is working to improve diet. She is working on a goal of weight loss. Patient asked today about potatoes. Discussed that sweet potato would be preferred over white and limiting portions as they are carbohydrates.   Exercise: Patient has increased walking or completing 20 minutes of activity daily. She hopes to slowly increase to 30 minutes tolerated each day.   Plan  Consider current medications Hypertension   BP today is:  <  130/80  Office blood pressures are  BP Readings from Last 3 Encounters:  01/16/20 122/68  12/19/19 124/70  09/12/19 114/60   Kidney Function Lab Results  Component Value Date/Time   CREATININE 0.84 12/19/2019 09:35 AM   CREATININE 0.85 09/12/2019 10:22 AM   GFRNONAA 72 12/19/2019 09:35 AM   GFRAA 83 12/19/2019 09:35 AM   K 4.7 12/19/2019 09:35 AM   K 5.0 09/12/2019 10:22 AM     Patient has failed these meds in the past: none rported Patient is currently controlled on the following medications:   Hydrochlorothiazide 25 mg daily   Ramipril 2.5 mg daily   Patient checks BP at home several times per month  Patient home BP readings are ranging: 120/80  We discussed diet and exercise extensively. Patient has started ramipril 2.5 mg daily. Will have labs checked in December.   Plan  Continue current medications.   Hyperlipidemia   LDL goal < 70  Lipid Panel     Component Value Date/Time   CHOL 150 12/19/2019 0935   TRIG 136 12/19/2019 0935   HDL 49 12/19/2019 0935   LDLCALC 77 12/19/2019 0935    Hepatic Function Latest Ref Rng & Units 12/19/2019 09/12/2019 08/09/2019  Total Protein 6.0 - 8.5 g/dL 6.5 7.2 7.6  Albumin 3.8 - 4.8 g/dL 4.3 3.9 4.1  AST 0 - 40 IU/L '12 10 17  ' ALT 0 - 32 IU/L '12 8 14  ' Alk Phosphatase 44 - 121 IU/L 128(H) 132(H) 106  Total Bilirubin 0.0 - 1.2 mg/dL 0.2 <0.2 1.0     The 10-year ASCVD risk score Mikey Bussing DC Jr., et al., 2013) is: 16%   Values used to calculate the score:     Age: 69 years     Sex:  Female     Is Non-Hispanic African American: No     Diabetic: Yes     Tobacco smoker: No     Systolic Blood Pressure: 111 mmHg     Is BP treated: Yes     HDL Cholesterol: 49 mg/dL     Total Cholesterol: 150 mg/dL   Patient has failed these meds in past: none reported Patient is currently uncontrolled on the following medications:  . Atorvastatin 20 mg daily . Fenofibrate 160 mg daily   We discussed:  diet and exercise extensively. Discussed diet and exercise during visit. Will review next lipid panel in December  to assess need for increased dose.   Plan  Continue current medications   Osteoarthritis   Patient has failed these meds in past: meloxicam, naproxen, oxycodone, tramadol  Patient is currently controlled on the following medications:  . Acetaminophen 500 mg - 1-2 tablets by mouth every 6 hours prn pain . Aspirin EC 325 mg bid  We discussed:  Patient reports osteoarthritis pain is well controlled with current therapy since knee replacement. Patient tolerating walking 20 minutes daily currently. Working to increase to 30 minutes as tolerated.   Plan  Continue current medications  Insomnia   Patient has failed these meds in past: mirtazapine, nortriptyline Patient is currently controlled on the following medications:  . Belsomra 10 mg daily prn  We discussed:   Reports sleep is better with exercise. Reports using Belsomra only once in the past week. Encouraged patient to continue exercise and continue working on reducing taking medication.   Plan  Continue current medications  Major Depression Disorder   Patient has failed these meds in past: bupropion, citalopram, desvenlafaxine, duloxetine, mirtazapine, nortriptyline Patient is  currently controlled on the following medications:  . Amitriptyline 25 mg daily at bedtime . Trintellix 20 mg daily   We discussed:   Patient has cut amitriptyline back to 25 mg daily. Working to cut 25 mg tablet in half to  further reduce dose. Patient reports doing well in spite of her husband's recovery from surgery. Thankful for the support of her son at home to help her. Patient has expressed concern for resources to pay utilities during this time. Pharmacist referred patient to [place] resource. Patient has called to leave a message but has not heard back. Patient reports that exercise is helping her manage symptoms.   Plan  Continue current medications  GERD   Patient has failed these meds in past: none reported Patient is currently controlled on the following medications:  . Omeprazole 20 mg daily   We discussed:  Patient reports medication has been delivered. Reports good control at this time.   Plan  Continue current medications    Neuropathy   Patient has failed these meds in past: none reported Patient is currently controlled on the following medications:  . Lyrica 200 mg bid  We discussed:  Patient reports symptoms of neuropathy well controlled. She is walking 20 minutes daily. Patient has not had to drive yet.  She is concerned to resume at this time.   Plan  Continue current medications   Osteopenia / Osteoporosis   Last DEXA Scan: 04/2019  T-Score femoral neck: -0.8  T-Score lumbar spine: 0.4     No results found for: VD25OH   Patient is not a candidate for pharmacologic treatment  Patient has failed these meds in past: none reported Patient is currently controlled on the following medications:  . None reported  We discussed:  Recommend [phone] units of vitamin D daily. Recommend 1200 mg of calcium daily from dietary and supplemental sources. Recommend weight-bearing and muscle strengthening exercises for building and maintaining bone density.   Patient reports eating cottage cheese often and enjoys dark leafy greens. Encouraged patient to read food labels to determine how much calcium she gets from her diet most days.   Recommend patient consider  supplementation with calcium if diet not consistent with calcium intake. Discussed benefits of Vitamin D supplementation. Patient plans to order with next order of supplements from [employer].   Plan  Continue current medications and control with diet and exercise. Consider Vitamin D 1000 unit supplement. Recommend Vitamin D level checked with next labs.    Health Maintenance   Patient is currently controlled on the following medications:   Supplements . Vitamin B-12 1000 mcg daily supplementation . Multiple Vitamin daily - supplementation PRN Medications . Ondansetron 4 mg every 8 hours prn nausea . Dicyclomine 20 mg every 6 hours prn - stomach pain . Flexeril 10 mg tid prn - muscle pain  We discussed:  Patient states well controlled. Rarely using prn medications.   Plan  Continue current medications  Vaccines   Reviewed and discussed patient's vaccination history.Patient reports both pneumonia vaccines received at pharmacy. Reports receiving both Shingrix vaccines. She thinks the pharmacy completed her Tetanus shot as well.   Immunization History  Administered Date(s) Administered  . Fluad Quad(high Dose 65+) 12/19/2019  . PFIZER SARS-COV-2 Vaccination 05/27/2019, 06/17/2019, 12/19/2019  . Zoster Recombinat (Shingrix) 04/04/2018    Plan  Recommended patient receive annual flu vaccine in office.   Medication Management   Pt uses [facility] for all medications Uses pill box? Yes Pt  endorses good compliance  We discussed: Verbal consent obtained for [facility] enhanced pharmacy services (medication synchronization, adherence packaging, delivery coordination). A medication sync plan was created to allow patient to get all medications delivered once every 30 to 90 days per patient preference. Patient understands they have freedom to choose pharmacy and clinical pharmacist will coordinate care between all prescribers and [facility].   Plan  Utilize [facility] for medication synchronization, packaging and delivery    Follow up: 1 month in-person visit

## 2020-03-10 ENCOUNTER — Other Ambulatory Visit: Payer: Self-pay

## 2020-03-10 ENCOUNTER — Ambulatory Visit (INDEPENDENT_AMBULATORY_CARE_PROVIDER_SITE_OTHER): Payer: 59 | Admitting: Family Medicine

## 2020-03-10 ENCOUNTER — Encounter: Payer: Self-pay | Admitting: Family Medicine

## 2020-03-10 VITALS — BP 112/60 | HR 67 | Resp 18 | Ht 63.0 in | Wt 228.6 lb

## 2020-03-10 DIAGNOSIS — Z0000 Encounter for general adult medical examination without abnormal findings: Secondary | ICD-10-CM

## 2020-03-10 DIAGNOSIS — Z6841 Body Mass Index (BMI) 40.0 and over, adult: Secondary | ICD-10-CM | POA: Insufficient documentation

## 2020-03-10 NOTE — Patient Instructions (Addendum)
List the Names of Other Physician/Practitioners you currently use: 1.  Dr. Sedalia Muta 2.  Dr. Precious Bard   Ms. Beverely Pace , Thank you for taking time to come for your Medicare Wellness Visit. I appreciate your ongoing commitment to your health goals. Please review the following plan we discussed and let me know if I can assist you in the future.   These are the goals we discussed: Weight loss-[facility] [address] [place], [place] [address] [phone] Pt to call [employer] [employer] to discuss coverage for a weight loss program  cologuard-send for evaluation of stool for colon cancer We will check with pharmacy about immunizations Pt to ask for a copy of eye exam next week at eye doctor Goals    . Pharmacy Care Plan     CARE PLAN ENTRY (see longitudinal plan of care for additional care plan information)  Current Barriers:  . Chronic Disease Management support, education, and care coordination needs related to Hypertension, Hyperlipidemia, Diabetes, and COPD   Hypertension BP Readings from Last 3 Encounters:  01/16/20 122/68  12/19/19 124/70  09/12/19 114/60   . Pharmacist Clinical Goal(s): o Over the next 90 days, patient will work with PharmD and providers to maintain BP goal <130/80 . Current regimen:  o Hydrochlorothiazide 25 mg daily o Ramipril 2.5 mg daily  . Interventions: o Discussed increasing exercise as tolerated to goal of 30 minutes each day. Patient currently around 20 minutes daily.  . Patient self care activities - Over the next 90 days, patient will: o Check BP weekly, document, and provide at future appointments o Ensure daily salt intake < 2300 mg/day  Hyperlipidemia Lab Results  Component Value Date/Time   LDLCALC 77 12/19/2019 09:35 AM   . Pharmacist Clinical Goal(s): o Over the next 90 days, patient will work with PharmD and providers to achieve LDL goal < 70 . Current regimen:  . Atorvastatin 20 mg daily . Fenofibrate 160 mg daily   . Interventions: o Discussed lifestyle modifications and weight loss goal.  o Encouraged activity goal of 30 minutes each day activity.  . Patient self care activities - Over the next 90 days, patient will: o Continue taking medication as prescribed.  o Contact pharmacist or provider with any questions or concerns.   Diabetes Lab Results  Component Value Date/Time   HGBA1C 6.0 (H) 12/19/2019 09:35 AM   HGBA1C 5.5 09/12/2019 10:22 AM   . Pharmacist Clinical Goal(s): o Over the next 90 days, patient will work with PharmD and providers to maintain A1c goal <7% . Current regimen:  o Ozempic 0.5 mg weekly o Metformin 500 mg daily with morning meal . Interventions: o Reviewed home blood sugar readings.  o Discussed healthy eating and activity.  o Patient will begin Ozempic 0.5 mg weekly this Sunday.  . Patient self care activities - Over the next 90 days, patient will: o Check blood sugar once daily, document, and provide at future appointments o Contact provider with any episodes of hypoglycemia  Depression . Pharmacist Clinical Goal(s) o Over the next 90 days, patient will work with PharmD and providers to manage symptoms of depression  . Current regimen:  o Trintellix 20 mg daily o Amitriptyline 25 mg daily . Interventions: o Patient reports taking Amitriptyline 25 mg daily currently. She hopes to taper off medication as tolerated. We discussed cutting dose in half as tolerated.   o Patient reports increasing walking to 20 minutes daily currently.   . Patient self care  activities - Over the next 90 days, patient will: o Work to reduce dose of amitriptyline as directed.  o Consider counseling for symptoms of depression.  o Contact pharmacist or provider with any questions or concerns.   Medication management . Pharmacist Clinical Goal(s): o Over the next 90 days, patient will work with PharmD and providers to achieve optimal medication adherence . Current pharmacy: [facility] . Interventions o Comprehensive medication review performed. o Utilize [facility] for medication synchronization, packaging and delivery . Patient self care activities - Over the next 90 days, patient will: o Focus on medication adherence by pill box o Take medications as prescribed o Report any questions or concerns to PharmD and/or provider(s)  Please see past updates related to this goal by clicking on the "Past Updates" button in the selected goal      . Set My Weight Loss Goal     Follow Up Date 03/10/2021   - set weight loss goal  Get to 160lbs   Why is this important?  Looks and for herself Losing only 5 to 15 percent of your weight makes a big difference in your health.    Notes:        This is a list of the screening recommended for you and due dates:  Health Maintenance  Topic Date Due  .  Hepatitis C: One time screening is recommended by Center for Disease Control  (CDC) for  adults born from [birth year] through 1964.   Never done  . Eye exam for diabetics  Never done  . Colon Cancer Screening  Never done  . Pneumonia vaccines (1 of 2 - PCV13) Never done  . Hemoglobin A1C  06/17/2020  . Complete foot exam   01/15/2021  . Mammogram  05/26/2021  . Tetanus Vaccine  05/09/2022  . Flu Shot  Completed  . DEXA scan (bone density measurement)  Completed  . COVID-19 Vaccine  Completed

## 2020-03-10 NOTE — Progress Notes (Signed)
Subjective:    Hailey Greene is a 69 y.o. female who presents for Medicare Annual/Subsequent preventive examination. Diagnostic mammogram-negative-repeat in 1 year DEXA-no osteoporosis-repeat 2 years Immunizations at [facility] drug Colonoscopy in [place] COVID -10/20 COVID x3  Preventive Screening-Counseling & Management  Tobacco Social History   Tobacco Use  Smoking Status Former Smoker  . Packs/day: 2.00  . Years: 12.00  . Pack years: 24.00  . Types: Cigarettes  . Quit date: 05/25/2010  . Years since quitting: 9.8  Smokeless Tobacco Never Used     Problems Prior to Visit 1. COPD 2. RLS 3. DM with neuropathy-pt no longer needing a walker-pt completed PT and is walking better 4. Overactive bladder 5. IBS 6. [place]-uses walker walker -last fall      10/27/19 Glucose fasting 116, 112-started ozempic weekly, +metformin Neuropathy-lyrica-down from 2 to 1/day Current Problems (verified) Patient Active Problem List   Diagnosis Date Noted  . Mild recurrent major depression (HCC) 09/12/2019  . Mixed hyperlipidemia 09/12/2019  . Primary insomnia 09/12/2019  . Nausea 08/07/2019  . Status post total knee replacement 07/25/2019  . Acute encephalopathy 07/19/2019  . Hypokalemia 07/19/2019  . Major depressive disorder   . S/P total knee replacement 07/15/2019  . Diarrhea of presumed infectious origin 06/06/2019  . Class 1 obesity due to excess calories with serious comorbidity and body mass index (BMI) of 33.0 to 33.9 in adult 06/02/2019  . Mild intermittent asthma without complication 06/02/2019  . Preoperative cardiovascular examination 05/31/2019  . Primary osteoarthritis of left knee 05/31/2019  . Diabetic polyneuropathy associated with type 2 diabetes mellitus (HCC) 05/31/2019  . Acute non-recurrent maxillary sinusitis 05/08/2019  . 2019 novel coronavirus disease (COVID-19) 12/31/2018  . Benign essential HTN 12/31/2018  . Chronic pain of left knee 12/11/2018  .  Incisional hernia 11/03/2016  . Idiopathic progressive neuropathy 04/11/2014    Medications Prior to Visit Current Outpatient Medications on File Prior to Visit  Medication Sig Dispense Refill  . acetaminophen (TYLENOL) 500 MG tablet Take 500-1,000 mg by mouth every 6 (six) hours as needed (for pain).    Marland Kitchen amitriptyline (ELAVIL) 25 MG tablet Take 1 tablet (25 mg total) by mouth 2 (two) times daily as needed (insomnia). (Patient taking differently: Take 25 mg by mouth daily.) 60 tablet 2  . aspirin EC 325 MG EC tablet Take 1 tablet (325 mg total) by mouth 2 (two) times daily. 30 tablet 0  . atorvastatin (LIPITOR) 20 MG tablet TAKE ONE (1) TABLET ONCE DAILY 90 tablet 1  . cyclobenzaprine (FLEXERIL) 10 MG tablet TAKE 1 TABLET 3 TIMES A DAY AS NEEDED 270 tablet 1  . dicyclomine (BENTYL) 20 MG tablet TAKE ONE TABLET EVERY 6 HOURS AS NEEDED 360 tablet 1  . fenofibrate 160 MG tablet Take 1 tablet (160 mg total) by mouth daily. 90 tablet 1  . fluticasone (FLONASE) 50 MCG/ACT nasal spray Place 2 sprays into both nostrils daily. 16 g 6  . glucose blood (ONETOUCH VERIO) test strip E11.42 Use new test strip each time when checking FBS 100 each 2  . hydrochlorothiazide (HYDRODIURIL) 25 MG tablet TAKE ONE (1) TABLET ONCE DAILY 90 tablet 1  . Lancets (ONETOUCH DELICA PLUS LANCET33G) MISC E 11.42 Use new lancet each time when checking FBS 100 each 2  . loratadine (ALLERGY RELIEF) 10 MG tablet TAKE ONE (1) TABLET BY MOUTH ONCE DAILY 90 tablet 3  . metFORMIN (GLUCOPHAGE) 500 MG tablet TAKE 1 TABLET ONCE DAILY WITH MORNING MEAL 90 tablet  1  . Multiple Vitamin (MULTIVITAMIN WITH MINERALS) TABS tablet Take 1 tablet by mouth daily.    Marland Kitchen omeprazole (PRILOSEC) 20 MG capsule Take 1 capsule (20 mg total) by mouth daily. 90 capsule 3  . ondansetron (ZOFRAN) 4 MG tablet TAKE ONE TABLET BY MOUTH EVERY EIGHT HOURS AS NEEDED NAUSEA 90 tablet 1  . pregabalin (LYRICA) 200 MG capsule Take 1 capsule (200 mg total) by mouth 2  (two) times daily. 180 capsule 1  . ramipril (ALTACE) 2.5 MG capsule Take 1 capsule (2.5 mg total) by mouth daily. 30 capsule 2  . rOPINIRole (REQUIP) 3 MG tablet TAKE ONE (1) TABLET BY MOUTH ONCE DAILY (1-3 HOURS BEFORE BEDTIME) 90 tablet 1  . Semaglutide,0.25 or 0.5MG /DOS, (OZEMPIC, 0.25 OR 0.5 MG/DOSE,) 2 MG/1.5ML SOPN 0.25 mg weekly. (Patient taking differently: Inject 0.5 mg into the skin once a week. 0.25 mg weekly.) 1.5 mL 1  . solifenacin (VESICARE) 5 MG tablet Take 1 tablet (5 mg total) by mouth daily. 90 tablet 1  . Suvorexant (BELSOMRA) 10 MG TABS TAKE 1 TABLET BY MOUTH WITHIN 30 MINUTES OF BEDTIME. ONLY TAKE IF WITHIN 7 HOURS OF WAKING TIME 90 tablet 1  . SYMBICORT 160-4.5 MCG/ACT inhaler Inhale 2 puffs into the lungs 2 times daily at 12 noon and 4 pm. 1 each 5  . vitamin B-12 1000 MCG tablet Take 1 tablet (1,000 mcg total) by mouth daily. 30 tablet 0  . vortioxetine HBr (TRINTELLIX) 20 MG TABS tablet Take 1 tablet (20 mg total) by mouth daily. 90 tablet 1   No current facility-administered medications on file prior to visit.    Current Medications (verified) Current Outpatient Medications  Medication Sig Dispense Refill  . acetaminophen (TYLENOL) 500 MG tablet Take 500-1,000 mg by mouth every 6 (six) hours as needed (for pain).    Marland Kitchen amitriptyline (ELAVIL) 25 MG tablet Take 1 tablet (25 mg total) by mouth 2 (two) times daily as needed (insomnia). (Patient taking differently: Take 25 mg by mouth daily.) 60 tablet 2  . aspirin EC 325 MG EC tablet Take 1 tablet (325 mg total) by mouth 2 (two) times daily. 30 tablet 0  . atorvastatin (LIPITOR) 20 MG tablet TAKE ONE (1) TABLET ONCE DAILY 90 tablet 1  . cyclobenzaprine (FLEXERIL) 10 MG tablet TAKE 1 TABLET 3 TIMES A DAY AS NEEDED 270 tablet 1  . dicyclomine (BENTYL) 20 MG tablet TAKE ONE TABLET EVERY 6 HOURS AS NEEDED 360 tablet 1  . fenofibrate 160 MG tablet Take 1 tablet (160 mg total) by mouth daily. 90 tablet 1  . fluticasone  (FLONASE) 50 MCG/ACT nasal spray Place 2 sprays into both nostrils daily. 16 g 6  . glucose blood (ONETOUCH VERIO) test strip E11.42 Use new test strip each time when checking FBS 100 each 2  . hydrochlorothiazide (HYDRODIURIL) 25 MG tablet TAKE ONE (1) TABLET ONCE DAILY 90 tablet 1  . Lancets (ONETOUCH DELICA PLUS LANCET33G) MISC E 11.42 Use new lancet each time when checking FBS 100 each 2  . loratadine (ALLERGY RELIEF) 10 MG tablet TAKE ONE (1) TABLET BY MOUTH ONCE DAILY 90 tablet 3  . metFORMIN (GLUCOPHAGE) 500 MG tablet TAKE 1 TABLET ONCE DAILY WITH MORNING MEAL 90 tablet 1  . Multiple Vitamin (MULTIVITAMIN WITH MINERALS) TABS tablet Take 1 tablet by mouth daily.    Marland Kitchen omeprazole (PRILOSEC) 20 MG capsule Take 1 capsule (20 mg total) by mouth daily. 90 capsule 3  . ondansetron (ZOFRAN) 4 MG tablet  TAKE ONE TABLET BY MOUTH EVERY EIGHT HOURS AS NEEDED NAUSEA 90 tablet 1  . pregabalin (LYRICA) 200 MG capsule Take 1 capsule (200 mg total) by mouth 2 (two) times daily. 180 capsule 1  . ramipril (ALTACE) 2.5 MG capsule Take 1 capsule (2.5 mg total) by mouth daily. 30 capsule 2  . rOPINIRole (REQUIP) 3 MG tablet TAKE ONE (1) TABLET BY MOUTH ONCE DAILY (1-3 HOURS BEFORE BEDTIME) 90 tablet 1  . Semaglutide,0.25 or 0.5MG /DOS, (OZEMPIC, 0.25 OR 0.5 MG/DOSE,) 2 MG/1.5ML SOPN 0.25 mg weekly. (Patient taking differently: Inject 0.5 mg into the skin once a week. 0.25 mg weekly.) 1.5 mL 1  . solifenacin (VESICARE) 5 MG tablet Take 1 tablet (5 mg total) by mouth daily. 90 tablet 1  . Suvorexant (BELSOMRA) 10 MG TABS TAKE 1 TABLET BY MOUTH WITHIN 30 MINUTES OF BEDTIME. ONLY TAKE IF WITHIN 7 HOURS OF WAKING TIME 90 tablet 1  . SYMBICORT 160-4.5 MCG/ACT inhaler Inhale 2 puffs into the lungs 2 times daily at 12 noon and 4 pm. 1 each 5  . vitamin B-12 1000 MCG tablet Take 1 tablet (1,000 mcg total) by mouth daily. 30 tablet 0  . vortioxetine HBr (TRINTELLIX) 20 MG TABS tablet Take 1 tablet (20 mg total) by mouth  daily. 90 tablet 1   No current facility-administered medications for this visit.     Allergies (verified) Patient has no known allergies.   PAST HISTORY  Family History Family History  Problem Relation Age of Onset  . Thyroid disease Mother   . Cancer Mother   . Migraines Mother   . Brain cancer Father   . Heart disease Maternal Grandmother   . Diabetes Daughter     Social History Social History   Tobacco Use  . Smoking status: Former Smoker    Packs/day: 2.00    Years: 12.00    Pack years: 24.00    Types: Cigarettes    Quit date: 05/25/2010    Years since quitting: 9.8  . Smokeless tobacco: Never Used  Substance Use Topics  . Alcohol use: Yes    Comment: occasional     Are there smokers in your home (other than you)? Smoked in the past-quit, husband quit smoking  Risk Factors Current exercise habits: limited Dietary issues discussed: Diabetic diet  Cardiac risk factors: Hyperlipidemia/HTN/DM  Depression Screen-takes Trintellix/Belsomra-started 3 months ago-worried not as helpful. Husband alcoholic, son in [place] (Note: if answer to eithIn your present state of health, do you have any difficulty performing the following activities?:  Driving? Learning to drive again Managing money? No difficulty Feeding yourself? No difficulty Getting from bed to chair? Chair to standing difficult Climbing a flight of stairs? One at a time Preparing food and eating?: cook at home-no difficulty Bathing or showering?  No difficulty Getting dressed: no difficuty Getting to the toilet? No difficulty-bars on the toilet Using the toilet: uses bars to get up Moving around from place to place: improved after therapy-[facility]HC PT to assist after having COVID In the past year have you fallen or had a near fall? Last fall August 1   Do you have more than one partner? Married 23years Hearing Difficulties:aides that pt does not wear  Do you feel that you have a problem with memory none   Do  you feel safe at home?  yes  Cognitive Testing-no concerns    Advanced Directives have been discussed with the patient? yes  List the Names of Other Physician/Practitioners you currently use:  1.  Dr. Sedalia Mutaox 2.  Dr. Precious BardSnipes  Immunization History  Administered Date(s) Administered  . Fluad Quad(high Dose 65+) 12/19/2019  . PFIZER SARS-COV-2 Vaccination 05/27/2019, 06/17/2019, 12/19/2019  . Zoster Recombinat (Shingrix) 04/04/2018    Screening Tests Health Maintenance  Topic Date Due  . Hepatitis C Screening  Never done  . OPHTHALMOLOGY EXAM  Never done  . COLONOSCOPY  Never done  . PNA vac Low Risk Adult (1 of 2 - PCV13) Never done  . HEMOGLOBIN A1C  06/17/2020  . FOOT EXAM  01/15/2021  . MAMMOGRAM  05/26/2021  . TETANUS/TDAP  05/09/2022  . INFLUENZA VACCINE  Completed  . DEXA SCAN  Completed  . COVID-19 Vaccine  Completed    All answers were reviewed with the patient and necessary referrals were made:  Hailey KaufmannLISA LEIGH Hailey Losey, MD   03/10/2020   Review of Systems -concern for weight loss, recently knee replacement -last one 4/21 with PT improving pts ROM   Objective:     Vision by Snellen chart: right eye:20/50, left eye 20/40, bilat 20/40 Body mass index is 40.49 kg/m. BP 112/60   Pulse 67   Resp 18   Ht 5\' 3"  (1.6 m)   Wt 228 lb 9.6 oz (103.7 kg)   SpO2 98%   BMI 40.49 kg/m    Assessment:   1. Medicare annual wellness visit, subsequent Pt will complete cologuard Pt request weight loss clinic  2. BMI 40.0-44.9, adult (HCC) Weight loss clinic recommended-pt will call [employer] for coverage Plan:    Patient Instructions (the written plan) was given to the patient.  Medicare Attestation I have personally reviewed: The patient's medical and social history Their use of alcohol, tobacco or illicit drugs Their current medications and supplements The patient's functional ability including ADLs,fall risks, home safety risks, cognitive, and hearing and visual  impairment Diet and physical activities Evidence for depression or mood disorders  The patient's weight, height, BMI, and visual acuity have been recorded in the chart.  I have made referrals, counseling, and provided education to the patient based on review of the above and I have provided the patient with a written personalized care plan for preventive services.     Hailey KaufmannLISA LEIGH Ravina Milner, MD   03/10/2020

## 2020-03-15 ENCOUNTER — Encounter: Payer: Self-pay | Admitting: Family Medicine

## 2020-03-19 ENCOUNTER — Other Ambulatory Visit: Payer: Self-pay | Admitting: Family Medicine

## 2020-03-23 NOTE — Chronic Care Management (AMB) (Signed)
Chronic Care Management Pharmacy  Name: Hailey Greene  MRN: [id number] DOB: January 29, 1951  Chief Complaint/ HPI  Hailey Greene,  69 y.o. , female presents for their Follow-Up CCM visit with the clinical pharmacist via telephone due to COVID-19 Pandemic.  PCP : Rochel Brome, MD  Their chronic conditions include: hypertension, asthma, type 2 diabetes, diabetic polyneuropathy, osteoarthritis of knee, major depressive disorder, depression, hyperlipidemia, insomnia.   Office Visits: 03/10/2020 - AWV with Dr. Holly Bodily  Consult Visit:  Medications:  No Known Allergies  SDOH Screenings   Alcohol Screen: Not on file  Depression (PHQ2-9): Medium Risk  . PHQ-2 Score: 14  Financial Resource Strain: High Risk  . Difficulty of Paying Living Expenses: Hard  Food Insecurity: No Food Insecurity  . Worried About Charity fundraiser in the Last Year: Never true  . Ran Out of Food in the Last Year: Never true  Housing: Low Risk   . Last Housing Risk Score: 0  Physical Activity: Insufficiently Active  . Days of Exercise per Week: 7 days  . Minutes of Exercise per Session: 20 min  Social Connections: Not on file  Stress: Not on file  Tobacco Use: Medium Risk  . Smoking Tobacco Use: Former Smoker  . Smokeless Tobacco Use: Never Used  Transportation Needs: No Transportation Needs  . Lack of Transportation (Medical): No  . Lack of Transportation (Non-Medical): No   Current Diagnosis/Assessment:  Goals Addressed            This Visit's Progress   . Pharmacy Care Plan       CARE PLAN ENTRY (see longitudinal plan of care for additional care plan information)  Current Barriers:  . Chronic Disease Management support, education, and care coordination needs related to Hypertension, Hyperlipidemia, Diabetes, and COPD   Hypertension BP Readings from Last 3 Encounters:  03/24/20 130/78  03/10/20 112/60  01/16/20 122/68   . Pharmacist Clinical Goal(s): o Over the next 90 days,  patient will work with PharmD and providers to maintain BP goal <130/80 . Current regimen:  o Hydrochlorothiazide 25 mg daily o Ramipril 2.5 mg daily  . Interventions: o Discussed increasing exercise as tolerated to goal of 30 minutes each day. Patient currently around 20 minutes daily.  o Patient currently walking 1000 steps per day and hoping to increase by 300 steps daily each week for the next few weeks.  . Patient self care activities - Over the next 90 days, patient will: o Check BP weekly, document, and provide at future appointments o Ensure daily salt intake < 2300 mg/day  Hyperlipidemia Lab Results  Component Value Date/Time   LDLCALC 77 12/19/2019 09:35 AM   . Pharmacist Clinical Goal(s): o Over the next 90 days, patient will work with PharmD and providers to achieve LDL goal < 70 . Current regimen:  . Atorvastatin 20 mg daily . Fenofibrate 160 mg daily  . Interventions: o Discussed lifestyle modifications and weight loss goal.  o Encouraged activity goal of 30 minutes each day activity.  . Patient self care activities - Over the next 90 days, patient will: o Continue taking medication as prescribed.  o Contact pharmacist or provider with any questions or concerns.   Diabetes Lab Results  Component Value Date/Time   HGBA1C 6.0 (H) 12/19/2019 09:35 AM   HGBA1C 5.5 09/12/2019 10:22 AM   . Pharmacist Clinical Goal(s): o Over the next 90 days, patient will work with PharmD and providers to maintain A1c goal <7% . Current  regimen:  o Ozempic 1 mg weekly o Metformin 500 mg daily with morning meal . Interventions: o Reviewed home blood sugar readings.  o Discussed healthy eating and activity.  o Patient will begin Ozempic 1 mg weekly.   . Patient self care activities - Over the next 90 days, patient will: o Check blood sugar once daily, document, and provide at future appointments o Contact provider with any episodes of hypoglycemia  Depression . Pharmacist  Clinical Goal(s) o Over the next 90 days, patient will work with PharmD and providers to manage symptoms of depression  . Current regimen:  o Trintellix 20 mg daily . Interventions: o Patient has been able to stop taking amitriptyline.  o Patient reports increasing walking to 1000 minutes daily currently.   . Patient self care activities - Over the next 90 days, patient will:  o Consider counseling for symptoms of depression.  o Contact pharmacist or provider with any questions or concerns.   Medication management . Pharmacist Clinical Goal(s): o Over the next 90 days, patient will work with PharmD and providers to achieve optimal medication adherence . Current pharmacy: [facility] . Interventions o Comprehensive medication review performed. o Utilize [facility] for medication synchronization, packaging and delivery . Patient self care activities - Over the next 90 days, patient will: o Focus on medication adherence by pill box o Take medications as prescribed o Report any questions or concerns to PharmD and/or provider(s)  Please see past updates related to this goal by clicking on the "Past Updates" button in the selected goal         COPD / Asthma / Tobacco   Eosinophil count:   Lab Results  Component Value Date/Time   EOSPCT 3 07/21/2019 05:29 AM  %                               Eos (Absolute):  Lab Results  Component Value Date/Time   EOSABS 0.1 12/19/2019 09:35 AM    Tobacco Status:  Social History   Tobacco Use  Smoking Status Former Smoker  . Packs/day: 2.00  . Years: 12.00  . Pack years: 24.00  . Types: Cigarettes  . Quit date: 05/25/2010  . Years since quitting: 9.8  Smokeless Tobacco Never Used    Patient has failed these meds in past: none reported  Patient is currently controlled on the following medications:   Flonase nasal spray 2 sprays in both nostrils daily  Loratadine 10 mg daily  Symbicort 160-4.5 mcg/act 2 puffs twice  daily   Albuterol prn inhaler use  Using maintenance inhaler regularly? Yes Frequency of rescue inhaler use:  daily  We discussed:  proper inhaler technique. Patient reports symptoms well controlled with current therapy.   Plan  Continue current medications   Diabetes   Recent Relevant Labs: Lab Results  Component Value Date/Time   HGBA1C 6.0 (H) 12/19/2019 09:35 AM   HGBA1C 5.5 09/12/2019 10:22 AM   MICROALBUR 30 05/31/2019 11:19 AM    Kidney Function Lab Results  Component Value Date/Time   CREATININE 0.84 12/19/2019 09:35 AM   CREATININE 0.85 09/12/2019 10:22 AM   GFRNONAA 72 12/19/2019 09:35 AM   GFRAA 83 12/19/2019 09:35 AM   K 4.7 12/19/2019 09:35 AM   K 5.0 09/12/2019 10:22 AM    Checking BG: Daily  Recent FBG Readings: Recent pre-meal BG readings: highest lately has been 116 mg/dL  Patient has failed these  meds in past: none reported Patient is currently controlled on the following medications:   Ozempic 1 mg weekly on Sundays  Metformin 500 mg daily with morning meal  One Touch Verio   One touch Delica lancets  Last diabetic Foot exam: 12/2019 Last diabetic Eye exam: 12/2019  We discussed: diet and exercise extensively.Patient will begin Ozempic 1 mg weekly this Sunday.   Diet: Patient is working to improve diet. She is working on a goal of weight loss. She has had some success the past few months and hopes to be able to continue this success.   Exercise: Patient has increased walking or completing 1000 steps daily. She hopes to slowly increase each week by 300 steps daily.   Plan  Increase Ozempic 1 mg weekly.  Hypertension   BP today is:  <130/80  Office blood pressures are  BP Readings from Last 3 Encounters:  03/24/20 130/78  03/10/20 112/60  01/16/20 122/68   Kidney Function Lab Results  Component Value Date/Time   CREATININE 0.84 12/19/2019 09:35 AM   CREATININE 0.85 09/12/2019 10:22 AM   GFRNONAA 72 12/19/2019 09:35 AM    GFRAA 83 12/19/2019 09:35 AM   K 4.7 12/19/2019 09:35 AM   K 5.0 09/12/2019 10:22 AM     Patient has failed these meds in the past: none rported Patient is currently controlled on the following medications:   Hydrochlorothiazide 25 mg daily   Ramipril 2.5 mg daily   Patient checks BP at home several times per month  Patient home BP readings are ranging: 120/80  We discussed diet and exercise extensively. Patient had labs checked today.  Plan  Continue current medications.   Hyperlipidemia   LDL goal < 70  Lipid Panel     Component Value Date/Time   CHOL 150 12/19/2019 0935   TRIG 136 12/19/2019 0935   HDL 49 12/19/2019 0935   LDLCALC 77 12/19/2019 0935    Hepatic Function Latest Ref Rng & Units 12/19/2019 09/12/2019 08/09/2019  Total Protein 6.0 - 8.5 g/dL 6.5 7.2 7.6  Albumin 3.8 - 4.8 g/dL 4.3 3.9 4.1  AST 0 - 40 IU/L '12 10 17  ' ALT 0 - 32 IU/L '12 8 14  ' Alk Phosphatase 44 - 121 IU/L 128(H) 132(H) 106  Total Bilirubin 0.0 - 1.2 mg/dL 0.2 <0.2 1.0     The 10-year ASCVD risk score Mikey Bussing DC Jr., et al., 2013) is: 18%   Values used to calculate the score:     Age: 30 years     Sex: Female     Is Non-Hispanic African American: No     Diabetic: Yes     Tobacco smoker: No     Systolic Blood Pressure: 675 mmHg     Is BP treated: Yes     HDL Cholesterol: 49 mg/dL     Total Cholesterol: 150 mg/dL   Patient has failed these meds in past: none reported Patient is currently uncontrolled on the following medications:  . Atorvastatin 20 mg daily . Fenofibrate 160 mg daily   We discussed:  diet and exercise extensively. Discussed diet and exercise during visit. Patient had lipid panel drawn today. Will review results. Patient continuing to work on healthy diet and exercise.   Plan  Continue current medications   Osteoarthritis   Patient has failed these meds in past: meloxicam, naproxen, oxycodone, tramadol  Patient is currently controlled on the following  medications:  . Acetaminophen 500 mg - 1-2 tablets by mouth every  6 hours prn pain . Aspirin EC 325 mg bid  We discussed:  Patient reports osteoarthritis pain is well controlled with current therapy since knee replacement. Patient tolerating walking 20 minutes daily currently. Working to increase to 30 minutes as tolerated.   Plan  Continue current medications  Insomnia   Patient has failed these meds in past: mirtazapine, nortriptyline, Belsomra Patient is currently controlled on the following medications:  . None reported  We discussed:   Reports sleep is much better with exercise. Reports stopping Belsomra and amitriptyline 3 weeks ago. Patient states she is sleeping well with out problems.   Plan  Continue current medications  Major Depression Disorder   PHQ9 SCORE ONLY 03/10/2020 12/22/2019 08/26/2019  PHQ-9 Total Score '14 21 21   ' Patient has failed these meds in past: bupropion, citalopram, desvenlafaxine, duloxetine, amitriptyline, mirtazapine, nortriptyline Patient is currently controlled on the following medications:  . Trintellix 20 mg daily   We discussed:   Patient was able to stop taking amitriptyline 3 weeks ago. She reports well controlled currently. Denies concerns at this time. Trying to stay busy, eat healthy and exercise to keep symptoms well controlled.   Plan  Continue current medications  GERD   Patient has failed these meds in past: none reported Patient is currently controlled on the following medications:  . Omeprazole 20 mg daily   We discussed:  Patient reports medication has been delivered. Reports good control at this time. Pharmacist would like to consider trial of tapering off or replacing with ranitidine at next visit.  Plan  Continue current medications    Neuropathy   Patient has failed these meds in past: none reported Patient is currently controlled on the following medications:  . Lyrica 100 mg bid  We discussed:  Lyrica  dose reduced to 100 mg bid during visit with Dr. Tobie Poet. Patient hopes to eventually cut back to 100 mg daily. Discussed current neuropathy improvement with healthier diet and exercise. Patient states her foot exam with Dr. Tobie Poet today was improved as well. Patient plans to get a good pair of tennis shoes for support.    Plan  Continue current medications   Osteopenia / Osteoporosis   Last DEXA Scan: 04/2019  T-Score femoral neck: -0.8  T-Score lumbar spine: 0.4     No results found for: VD25OH   Patient is not a candidate for pharmacologic treatment  Patient has failed these meds in past: none reported Patient is currently controlled on the following medications:  . None reported  We discussed:  Recommend [phone] units of vitamin D daily. Recommend 1200 mg of calcium daily from dietary and supplemental sources. Recommend weight-bearing and muscle strengthening exercises for building and maintaining bone density.   Patient reports eating cottage cheese often and enjoys dark leafy greens. Encouraged patient to read food labels to determine how much calcium she gets from her diet most days.   Recommend patient consider supplementation with calcium if diet not consistent with calcium intake. Discussed benefits of Vitamin D supplementation. Patient plans to order with next order of supplements from [employer].   Plan  Continue current medications and control with diet and exercise. Consider Vitamin D 1000 unit supplement. Recommend Vitamin D level checked with next labs.    Health Maintenance   Patient is currently controlled on the following medications:   Supplements . Vitamin B-12 1000 mcg daily supplementation . Multiple Vitamin daily - supplementation PRN Medications . Ondansetron 4 mg every 8 hours prn nausea .  Dicyclomine 20 mg every 6 hours prn - stomach pain . Flexeril 10 mg tid prn - muscle pain  We discussed:  Patient states well controlled. Rarely using prn  medications.   Plan  Continue current medications  Vaccines   Reviewed and discussed patient's vaccination history.Patient reports both pneumonia vaccines received at pharmacy. Reports receiving both Shingrix vaccines. She thinks the pharmacy completed her Tetanus shot as well.   Immunization History  Administered Date(s) Administered  . Fluad Quad(high Dose 65+) 12/19/2019  . Influenza-Unspecified 01/17/2018, 12/14/2018  . PFIZER SARS-COV-2 Vaccination 05/27/2019, 06/17/2019, 12/19/2019  . Pneumococcal Conjugate-13 04/06/2017  . Tdap 03/04/2019  . Zoster Recombinat (Shingrix) 08/02/2017, 04/04/2018    Plan  Recommended patient receive annual flu vaccine in office.   Medication Management   Pt uses [facility] for all medications Uses pill box? Yes Pt endorses good compliance  We discussed: Verbal consent obtained for [facility] enhanced pharmacy services (medication synchronization, adherence packaging, delivery coordination). A medication sync plan was created to allow patient to get all medications delivered once every 30 to 90 days per patient preference. Patient understands they have freedom to choose pharmacy and clinical pharmacist will coordinate care between all prescribers and [facility].   Plan  Utilize [facility] for medication synchronization, packaging and delivery    Follow up: 1 month phone visit

## 2020-03-24 ENCOUNTER — Ambulatory Visit (INDEPENDENT_AMBULATORY_CARE_PROVIDER_SITE_OTHER): Payer: 59 | Admitting: Family Medicine

## 2020-03-24 ENCOUNTER — Other Ambulatory Visit: Payer: Self-pay

## 2020-03-24 ENCOUNTER — Ambulatory Visit: Payer: 59

## 2020-03-24 ENCOUNTER — Encounter: Payer: Self-pay | Admitting: Family Medicine

## 2020-03-24 ENCOUNTER — Telehealth: Payer: Self-pay

## 2020-03-24 VITALS — BP 130/78 | HR 81 | Temp 97.2°F | Ht 63.0 in | Wt 226.0 lb

## 2020-03-24 DIAGNOSIS — I10 Essential (primary) hypertension: Secondary | ICD-10-CM

## 2020-03-24 DIAGNOSIS — E1142 Type 2 diabetes mellitus with diabetic polyneuropathy: Secondary | ICD-10-CM | POA: Diagnosis not present

## 2020-03-24 DIAGNOSIS — G2581 Restless legs syndrome: Secondary | ICD-10-CM

## 2020-03-24 DIAGNOSIS — F330 Major depressive disorder, recurrent, mild: Secondary | ICD-10-CM | POA: Diagnosis not present

## 2020-03-24 DIAGNOSIS — E782 Mixed hyperlipidemia: Secondary | ICD-10-CM | POA: Diagnosis not present

## 2020-03-24 DIAGNOSIS — K219 Gastro-esophageal reflux disease without esophagitis: Secondary | ICD-10-CM

## 2020-03-24 DIAGNOSIS — J301 Allergic rhinitis due to pollen: Secondary | ICD-10-CM

## 2020-03-24 DIAGNOSIS — F332 Major depressive disorder, recurrent severe without psychotic features: Secondary | ICD-10-CM

## 2020-03-24 MED ORDER — OZEMPIC (1 MG/DOSE) 2 MG/1.5ML ~~LOC~~ SOPN
1.0000 mg | PEN_INJECTOR | SUBCUTANEOUS | 1 refills | Status: DC
Start: 2020-03-24 — End: 2020-07-15

## 2020-03-24 MED ORDER — FENOFIBRATE 160 MG PO TABS
160.0000 mg | ORAL_TABLET | Freq: Every day | ORAL | 1 refills | Status: DC
Start: 2020-03-24 — End: 2020-09-14

## 2020-03-24 MED ORDER — LORATADINE 10 MG PO TABS
ORAL_TABLET | ORAL | 3 refills | Status: DC
Start: 2020-03-24 — End: 2020-12-22

## 2020-03-24 MED ORDER — VORTIOXETINE HBR 20 MG PO TABS: 20.0000 mg | ORAL_TABLET | Freq: Every day | ORAL | 1 refills | Status: DC

## 2020-03-24 MED ORDER — FLUTICASONE PROPIONATE 50 MCG/ACT NA SUSP: NASAL | 2 refills | Status: DC

## 2020-03-24 MED ORDER — RAMIPRIL 2.5 MG PO CAPS: 2.5000 mg | ORAL_CAPSULE | Freq: Every day | ORAL | 1 refills | Status: DC

## 2020-03-24 MED ORDER — HYDROCHLOROTHIAZIDE 25 MG PO TABS
ORAL_TABLET | ORAL | 1 refills | Status: DC
Start: 2020-03-24 — End: 2020-09-14

## 2020-03-24 MED ORDER — ATORVASTATIN CALCIUM 20 MG PO TABS
ORAL_TABLET | ORAL | 1 refills | Status: DC
Start: 2020-03-24 — End: 2020-09-14

## 2020-03-24 MED ORDER — PREGABALIN 100 MG PO CAPS: 100.0000 mg | ORAL_CAPSULE | Freq: Two times a day (BID) | ORAL | 0 refills | Status: DC

## 2020-03-24 MED ORDER — OMEPRAZOLE 20 MG PO CPDR: 20.0000 mg | DELAYED_RELEASE_CAPSULE | Freq: Every day | ORAL | 3 refills | Status: DC

## 2020-03-24 MED ORDER — DICYCLOMINE HCL 20 MG PO TABS: ORAL_TABLET | ORAL | 1 refills | Status: DC

## 2020-03-24 MED ORDER — ONDANSETRON HCL 4 MG PO TABS: ORAL_TABLET | ORAL | 1 refills | Status: DC

## 2020-03-24 NOTE — Chronic Care Management (AMB) (Addendum)
Chronic Care Management Pharmacy Assistant   Name: Hailey Greene  MRN: [id number] DOB: 27-Oct-1950  Reason for Encounter: Medication Review  Patient Questions:  1.  Have you seen any other providers since your last visit? No  2.  Any changes in your medicines or health? No  PCP : Blane Ohara, MD  Allergies:  No Known Allergies  Medications: Outpatient Encounter Medications as of 03/24/2020  Medication Sig   acetaminophen (TYLENOL) 500 MG tablet Take 500-1,000 mg by mouth every 6 (six) hours as needed (for pain).   aspirin EC 325 MG EC tablet Take 1 tablet (325 mg total) by mouth 2 (two) times daily.   atorvastatin (LIPITOR) 20 MG tablet TAKE ONE (1) TABLET ONCE DAILY   cyclobenzaprine (FLEXERIL) 10 MG tablet TAKE 1 TABLET 3 TIMES A DAY AS NEEDED   dicyclomine (BENTYL) 20 MG tablet TAKE ONE TABLET EVERY 6 HOURS AS NEEDED   fenofibrate 160 MG tablet Take 1 tablet (160 mg total) by mouth daily.   fluticasone (FLONASE) 50 MCG/ACT nasal spray SPRAY 2 SPRAYS INTO EACH NOSTRIL EVERY DAY   glucose blood (ONETOUCH VERIO) test strip E11.42 Use new test strip each time when checking FBS   hydrochlorothiazide (HYDRODIURIL) 25 MG tablet TAKE ONE (1) TABLET ONCE DAILY   Lancets (ONETOUCH DELICA PLUS LANCET33G) MISC E 11.42 Use new lancet each time when checking FBS   loratadine (ALLERGY RELIEF) 10 MG tablet TAKE ONE (1) TABLET BY MOUTH ONCE DAILY   metFORMIN (GLUCOPHAGE) 500 MG tablet TAKE 1 TABLET ONCE DAILY WITH MORNING MEAL   Multiple Vitamin (MULTIVITAMIN WITH MINERALS) TABS tablet Take 1 tablet by mouth daily.   omeprazole (PRILOSEC) 20 MG capsule Take 1 capsule (20 mg total) by mouth daily.   ondansetron (ZOFRAN) 4 MG tablet TAKE ONE TABLET BY MOUTH EVERY EIGHT HOURS AS NEEDED NAUSEA   pregabalin (LYRICA) 100 MG capsule Take 1 capsule (100 mg total) by mouth 2 (two) times daily.   ramipril (ALTACE) 2.5 MG capsule Take 1 capsule (2.5 mg total) by mouth daily.   rOPINIRole (REQUIP) 3  MG tablet TAKE ONE (1) TABLET BY MOUTH ONCE DAILY (1-3 HOURS BEFORE BEDTIME)   Semaglutide, 1 MG/DOSE, (OZEMPIC, 1 MG/DOSE,) 2 MG/1.5ML SOPN Inject 1 mg into the skin once a week.   solifenacin (VESICARE) 5 MG tablet Take 1 tablet (5 mg total) by mouth daily.   Suvorexant (BELSOMRA) 10 MG TABS TAKE 1 TABLET BY MOUTH WITHIN 30 MINUTES OF BEDTIME. ONLY TAKE IF WITHIN 7 HOURS OF WAKING TIME (Patient not taking: Reported on 03/24/2020)   SYMBICORT 160-4.5 MCG/ACT inhaler Inhale 2 puffs into the lungs 2 times daily at 12 noon and 4 pm.   vitamin B-12 1000 MCG tablet Take 1 tablet (1,000 mcg total) by mouth daily.   vortioxetine HBr (TRINTELLIX) 20 MG TABS tablet Take 1 tablet (20 mg total) by mouth daily.   No facility-administered encounter medications on file as of 03/24/2020.    Current Diagnosis: Patient Active Problem List   Diagnosis Date Noted   BMI 40.0-44.9, adult (HCC) 03/10/2020   Mild recurrent major depression (HCC) 09/12/2019   Mixed hyperlipidemia 09/12/2019   Primary insomnia 09/12/2019   Nausea 08/07/2019   Status post total knee replacement 07/25/2019   Acute encephalopathy 07/19/2019   Hypokalemia 07/19/2019   Major depressive disorder    S/P total knee replacement 07/15/2019   Diarrhea of presumed infectious origin 06/06/2019   Class 1 obesity due to excess calories with serious  comorbidity and body mass index (BMI) of 33.0 to 33.9 in adult 06/02/2019   Mild intermittent asthma without complication 06/02/2019   Medicare annual wellness visit, subsequent 05/31/2019   Primary osteoarthritis of left knee 05/31/2019   Diabetic polyneuropathy associated with type 2 diabetes mellitus (HCC) 05/31/2019   Acute non-recurrent maxillary sinusitis 05/08/2019   2019 novel coronavirus disease (COVID-19) 12/31/2018   Benign essential HTN 12/31/2018   Chronic pain of left knee 12/11/2018   Incisional hernia 11/03/2016   Idiopathic progressive neuropathy 04/11/2014   Reviewed chart  for medication changes ahead of medication coordination call.  No OVs, Consults, or hospital visits since last care coordination call/Pharmacist visit.   No medication changes indicated OR if recent visit, treatment plan here.  BP Readings from Last 3 Encounters:  03/24/20 130/78  03/10/20 112/60  01/16/20 122/68    Lab Results  Component Value Date   HGBA1C 6.0 (H) 12/19/2019     Patient obtains medications through [facility]  90 Days   Last adherence delivery included:  Ramipril 2.5 mg 1 capsule daily Metformin 500 mg 1 tablet every morning Atorvastatin 20 mg 1 tablet daily Amitriptyline 25 mg 1 tablet daily as needed.   Patient declined the following medications last month: Unable to locate   Patient is due for next adherence delivery on: 03/31/2020.  Called patient and reviewed medications and coordinated delivery.  This delivery to include: Atorvastatin 20 mg 1 tablet daily   Dicylclomine 20 mg every 6 hours prn  Fenofibrate 160 mg 1 tablet daily   Hydrochlorothiazide 25 mg 1 tablet daily  Loratadine 10 mg 1 tablet daily   Omeprazole 20 mg 1 tablet daily  Ondansetron 4 mg q8h prn nausea  One Touch Verio test strips daily  One Touch Delica Lancets daily  Ozempic 1 mg/dose (4mg /84mL) Inject in the skin once a week as directed Pregabalin 100 mg 1 capsule twice daily Ramipril 2.5 mg 1 tablet daily Ropinirole 3 mg 1 tablet daily   Solifenacin (Vesicare) 5 mg 1 tablet daily  Trintellix 20 mg 1 tablet daily    Patient will not need a a short fill or acute of medications, prior to adherence delivery. (Delivery already scheduled for today)  Patient declined the following medications  Amitriptyline 25 mg 1 tablet daily as needed  - D/C Amitriptyline 50 mg 1 tablet daily as needed at bedtime- D/C Belsomra 10 mg within 30 mins of bedtime as needed D/C Cyclobenzaprine 10 mg tid prn - PRN has enough on hand Flonase nasal spray - 2 sprays into both nostrils daily - Picked up from  [facility] 03/24/20 Metformin 500 mg daily with morning meal  Symbicort 160-4.5 2 puffs bid  - Has enough on hand Victoza 0.6mg /0.75mL Inject 1.2 mg SQ once daily- D/C Victoza 0.6mg /0.13mL inject 1.8 mg SQ once daily- D/C   Patient needs refills for : NONE  Confirmed delivery date of 03/31/2020 , advised patient that pharmacy will contact them the morning of delivery.  Follow-Up:  05/29/2020 and Pharmacist Review   Occupational hygienist, CPP notified  Phil Dopp, [facility] Clinical Pharmacy Assistant [phone]

## 2020-03-24 NOTE — Progress Notes (Signed)
Subjective:  Patient ID: Hailey Greene, female    DOB: 1950-06-17  Age: 69 y.o. MRN: [id number]  Chief Complaint  Patient presents with  . Hyperlipidemia  . Hypertension  . Diabetes    HPI  Hypertension- hctz, ramipril. Low salt diet.  Hyperlipidemia- lipitor, fenofibrate. Low fat diet.  Diabetes- Ozempic 0.5 mg once weekly and  Metformin 500 mg once daily. Pt has weaned down lyrica to 100 mg one twice a day and amitriptyline has discontinued. Eating healthy (fruits,vegetables, fish.) Using a pedometer. Depression: doing well on trintellix. Pt is not taking belsomra and is sleeping well.  Obesity: pt is adjusting diet and increasing exercise. She has weaned off amitritpyline and is currently weaning down lyrica as these could be contributing to her difficulty with weight loss.  Current Outpatient Medications on File Prior to Visit  Medication Sig Dispense Refill  . acetaminophen (TYLENOL) 500 MG tablet Take 500-1,000 mg by mouth every 6 (six) hours as needed (for pain).    Marland Kitchen aspirin EC 325 MG EC tablet Take 1 tablet (325 mg total) by mouth 2 (two) times daily. 30 tablet 0  . cyclobenzaprine (FLEXERIL) 10 MG tablet TAKE 1 TABLET 3 TIMES A DAY AS NEEDED 270 tablet 1  . glucose blood (ONETOUCH VERIO) test strip E11.42 Use new test strip each time when checking FBS 100 each 2  . Lancets (ONETOUCH DELICA PLUS LANCET33G) MISC E 11.42 Use new lancet each time when checking FBS 100 each 2  . metFORMIN (GLUCOPHAGE) 500 MG tablet TAKE 1 TABLET ONCE DAILY WITH MORNING MEAL 90 tablet 1  . Multiple Vitamin (MULTIVITAMIN WITH MINERALS) TABS tablet Take 1 tablet by mouth daily.    Marland Kitchen rOPINIRole (REQUIP) 3 MG tablet TAKE ONE (1) TABLET BY MOUTH ONCE DAILY (1-3 HOURS BEFORE BEDTIME) 90 tablet 1  . solifenacin (VESICARE) 5 MG tablet Take 1 tablet (5 mg total) by mouth daily. 90 tablet 1  . Suvorexant (BELSOMRA) 10 MG TABS TAKE 1 TABLET BY MOUTH WITHIN 30 MINUTES OF BEDTIME. ONLY TAKE IF WITHIN 7  HOURS OF WAKING TIME (Patient not taking: Reported on 03/24/2020) 90 tablet 1  . SYMBICORT 160-4.5 MCG/ACT inhaler Inhale 2 puffs into the lungs 2 times daily at 12 noon and 4 pm. 1 each 5  . vitamin B-12 1000 MCG tablet Take 1 tablet (1,000 mcg total) by mouth daily. 30 tablet 0   No current facility-administered medications on file prior to visit.   Past Medical History:  Diagnosis Date  . Acute hypoxemic respiratory failure due to COVID-19 (HCC)   . Chicken pox   . Depression   . Diabetes (HCC)   . GERD (gastroesophageal reflux disease)   . Hypertension   . Idiopathic progressive neuropathy   . Major depressive disorder   . Mixed hyperlipidemia   . Neuropathy   . Osteoarthritis   . Pneumonia   . Primary insomnia   . RLS (restless legs syndrome)   . Tachycardia   . Urge incontinence   . UTI (lower urinary tract infection)   . Weakness    Past Surgical History:  Procedure Laterality Date  . ABDOMINAL HYSTERECTOMY    . APPENDECTOMY    . CHOLECYSTECTOMY    . HERNIA REPAIR    . REPLACEMENT TOTAL KNEE Right   . TONSILLECTOMY    . TOTAL KNEE ARTHROPLASTY Left 07/15/2019   Procedure: TOTAL KNEE ARTHROPLASTY;  Surgeon: Dannielle Huh, MD;  Location: [facility];  Service: Orthopedics;  Laterality: Left;  Family History  Problem Relation Age of Onset  . Thyroid disease Mother   . Cancer Mother   . Migraines Mother   . Brain cancer Father   . Heart disease Maternal Grandmother   . Diabetes Daughter    Social History   Socioeconomic History  . Marital status: Married    Spouse name: Not on file  . Number of children: 3  . Years of education: 37  . Highest education level: Not on file  Occupational History  . Occupation: Housewife  Tobacco Use  . Smoking status: Former Smoker    Packs/day: 2.00    Years: 12.00    Pack years: 24.00    Types: Cigarettes    Quit date: 05/25/2010    Years since quitting: 9.8  . Smokeless tobacco: Never Used  Vaping Use  . Vaping Use:  Never used  Substance and Sexual Activity  . Alcohol use: Yes    Comment: occasional  . Drug use: No  . Sexual activity: Not on file  Other Topics Concern  . Not on file  Social History Narrative   Born and raised in [place], [place]. Currently lives in a house with her husband. 1 dog. Fun: Garden, feed birds, swimming   Denies any religious beliefs effecting health care.    Social Determinants of Health   Financial Resource Strain: High Risk  . Difficulty of Paying Living Expenses: Hard  Food Insecurity: No Food Insecurity  . Worried About Programme researcher, broadcasting/film/video in the Last Year: Never true  . Ran Out of Food in the Last Year: Never true  Transportation Needs: No Transportation Needs  . Lack of Transportation (Medical): No  . Lack of Transportation (Non-Medical): No  Physical Activity: Insufficiently Active  . Days of Exercise per Week: 7 days  . Minutes of Exercise per Session: 20 min  Stress: Not on file  Social Connections: Not on file    Review of Systems  Constitutional: Negative for chills, fatigue and fever.  HENT: Negative for congestion, ear pain, rhinorrhea and sore throat.   Respiratory: Negative for cough and shortness of breath.   Cardiovascular: Negative for chest pain.  Gastrointestinal: Negative for abdominal pain, constipation, diarrhea, nausea and vomiting.  Genitourinary: Negative for dysuria and urgency.  Musculoskeletal: Positive for arthralgias. Negative for back pain and myalgias.  Neurological: Negative for dizziness, weakness, light-headedness and headaches.  Psychiatric/Behavioral: Negative for dysphoric mood. The patient is not nervous/anxious.      Objective:  BP 130/78   Pulse 81   Temp (!) 97.2 F (36.2 C)   Ht 5\' 3"  (1.6 m)   Wt 226 lb (102.5 kg)   SpO2 97%   BMI 40.03 kg/m   BP/Weight 03/24/2020 03/10/2020 01/16/2020  Systolic BP 130 112 122  Diastolic BP 78 60 68  Wt. (Lbs) 226 228.6 230.4  BMI 40.03 40.49 40.81    Physical  Exam Vitals reviewed.  Constitutional:      Appearance: Normal appearance. She is normal weight.  Neck:     Vascular: No carotid bruit.  Cardiovascular:     Rate and Rhythm: Normal rate and regular rhythm.     Pulses: Normal pulses.     Heart sounds: Normal heart sounds.  Pulmonary:     Effort: Pulmonary effort is normal. No respiratory distress.     Breath sounds: Normal breath sounds.  Abdominal:     General: Abdomen is flat. Bowel sounds are normal.     Palpations: Abdomen is soft.  Tenderness: There is no abdominal tenderness.  Neurological:     Mental Status: She is alert and oriented to person, place, and time.  Psychiatric:        Mood and Affect: Mood normal.        Behavior: Behavior normal.     Diabetic Foot Exam - Simple   Simple Foot Form Diabetic Foot exam was performed with the following findings: Yes 03/24/2020 10:03 AM  Visual Inspection No deformities, no ulcerations, no other skin breakdown bilaterally: Yes Sensation Testing Intact to touch and monofilament testing bilaterally: Yes Pulse Check Posterior Tibialis and Dorsalis pulse intact bilaterally: Yes Comments      Lab Results  Component Value Date   WBC 9.2 03/24/2020   HGB 12.9 03/24/2020   HCT 39.3 03/24/2020   PLT 354 03/24/2020   GLUCOSE 100 (H) 03/24/2020   CHOL 157 03/24/2020   TRIG 82 03/24/2020   HDL 50 03/24/2020   LDLCALC 91 03/24/2020   ALT 7 03/24/2020   AST 11 03/24/2020   NA 138 03/24/2020   K 4.9 03/24/2020   CL 99 03/24/2020   CREATININE 0.89 03/24/2020   BUN 16 03/24/2020   CO2 25 03/24/2020   TSH 0.885 07/19/2019   HGBA1C 5.9 (H) 03/24/2020   MICROALBUR 30 05/31/2019      Assessment & Plan:   1. Diabetic polyneuropathy associated with type 2 diabetes mellitus (HCC) Well controlled. Increase ozempic to 1 mg weekly for weight loss. May need to discontinue metformin. Decrease lyrica to 100 mg one twice a day.  Continue to eat diabetic diet. Recommend try to  gradually increase steps as she is not used to walking.  - CBC with Differential/Platelet - Hemoglobin A1c - Semaglutide, 1 MG/DOSE, (OZEMPIC, 1 MG/DOSE,) 2 MG/1.5ML SOPN; Inject 1 mg into the skin once a week.  Dispense: 6 mL; Refill: 1 - pregabalin (LYRICA) 100 MG capsule; Take 1 capsule (100 mg total) by mouth 2 (two) times daily.  Dispense: 180 capsule; Refill: 0 - Cardiovascular Risk Assessment  2. Mild episode of recurrent major depressive disorder (HCC) The current medical regimen is effective;  continue present plan and medications. - vortioxetine HBr (TRINTELLIX) 20 MG TABS tablet; Take 1 tablet (20 mg total) by mouth daily.  Dispense: 90 tablet; Refill: 1  3. Essential hypertension The current medical regimen is effective;  continue present plan and medications. - hydrochlorothiazide (HYDRODIURIL) 25 MG tablet; TAKE ONE (1) TABLET ONCE DAILY  Dispense: 90 tablet; Refill: 1 - ramipril (ALTACE) 2.5 MG capsule; Take 1 capsule (2.5 mg total) by mouth daily.  Dispense: 90 capsule; Refill: 1 - Comprehensive metabolic panel  4. Mixed hyperlipidemia The current medical regimen is effective;  continue present plan and medications. Low fat diet and exercise.  - atorvastatin (LIPITOR) 20 MG tablet; TAKE ONE (1) TABLET ONCE DAILY  Dispense: 90 tablet; Refill: 1 - fenofibrate 160 MG tablet; Take 1 tablet (160 mg total) by mouth daily.  Dispense: 90 tablet; Refill: 1 - Lipid panel  5. Morbid obesity with body mass index (BMI) of 40.0 to 49.9 (HCC) Increase ozempic to 1 mg weekly for weight loss. May need to discontinue metformin. Decrease lyrica to 100 mg one twice a day. Continue to eat diabetic diet. Recommend try to gradually increase steps as she is not used to walking.  Pt to call back if wishes [facility]. - Semaglutide, 1 MG/DOSE, (OZEMPIC, 1 MG/DOSE,) 2 MG/1.5ML SOPN; Inject 1 mg into the  skin once a week.  Dispense: 6 mL; Refill: 1  6.  Gastroesophageal reflux disease without esophagitis The current medical regimen is effective;  continue present plan and medications. - omeprazole (PRILOSEC) 20 MG capsule; Take 1 capsule (20 mg total) by mouth daily.  Dispense: 90 capsule; Refill: 3  7. RLS (restless legs syndrome) The current medical regimen is effective;  continue present plan and medications.  8. Non-seasonal allergic rhinitis due to pollen The current medical regimen is effective;  continue present plan and medications. - fluticasone (FLONASE) 50 MCG/ACT nasal spray; SPRAY 2 SPRAYS INTO EACH NOSTRIL EVERY DAY  Dispense: 48 mL; Refill: 2 - loratadine (ALLERGY RELIEF) 10 MG tablet; TAKE ONE (1) TABLET BY MOUTH ONCE DAILY  Dispense: 90 tablet; Refill: 3    Meds ordered this encounter  Medications  . atorvastatin (LIPITOR) 20 MG tablet    Sig: TAKE ONE (1) TABLET ONCE DAILY    Dispense:  90 tablet    Refill:  1  . dicyclomine (BENTYL) 20 MG tablet    Sig: TAKE ONE TABLET EVERY 6 HOURS AS NEEDED    Dispense:  360 tablet    Refill:  1  . fenofibrate 160 MG tablet    Sig: Take 1 tablet (160 mg total) by mouth daily.    Dispense:  90 tablet    Refill:  1  . fluticasone (FLONASE) 50 MCG/ACT nasal spray    Sig: SPRAY 2 SPRAYS INTO EACH NOSTRIL EVERY DAY    Dispense:  48 mL    Refill:  2  . hydrochlorothiazide (HYDRODIURIL) 25 MG tablet    Sig: TAKE ONE (1) TABLET ONCE DAILY    Dispense:  90 tablet    Refill:  1  . loratadine (ALLERGY RELIEF) 10 MG tablet    Sig: TAKE ONE (1) TABLET BY MOUTH ONCE DAILY    Dispense:  90 tablet    Refill:  3  . omeprazole (PRILOSEC) 20 MG capsule    Sig: Take 1 capsule (20 mg total) by mouth daily.    Dispense:  90 capsule    Refill:  3  . ondansetron (ZOFRAN) 4 MG tablet    Sig: TAKE ONE TABLET BY MOUTH EVERY EIGHT HOURS AS NEEDED NAUSEA    Dispense:  90 tablet    Refill:  1  . ramipril (ALTACE) 2.5 MG capsule    Sig: Take 1 capsule (2.5 mg total) by mouth daily.     Dispense:  90 capsule    Refill:  1  . vortioxetine HBr (TRINTELLIX) 20 MG TABS tablet    Sig: Take 1 tablet (20 mg total) by mouth daily.    Dispense:  90 tablet    Refill:  1  . Semaglutide, 1 MG/DOSE, (OZEMPIC, 1 MG/DOSE,) 2 MG/1.5ML SOPN    Sig: Inject 1 mg into the skin once a week.    Dispense:  6 mL    Refill:  1    90 day rx.  . pregabalin (LYRICA) 100 MG capsule    Sig: Take 1 capsule (100 mg total) by mouth 2 (two) times daily.    Dispense:  180 capsule    Refill:  0    Orders Placed This Encounter  Procedures  . CBC with Differential/Platelet  . Comprehensive metabolic panel  . Hemoglobin A1c  . Lipid panel  . Cardiovascular Risk Assessment      Follow-up: Return in about 3 months (around 06/22/2020) for fasting.  An After Visit Summary was printed  and given to the patient.  Rochel Brome, MD [facility] [phone]

## 2020-03-24 NOTE — Patient Instructions (Addendum)
Visit Information  Goals Addressed            This Visit's Progress   . Pharmacy Care Plan       CARE PLAN ENTRY (see longitudinal plan of care for additional care plan information)  Current Barriers:  . Chronic Disease Management support, education, and care coordination needs related to Hypertension, Hyperlipidemia, Diabetes, and COPD   Hypertension BP Readings from Last 3 Encounters:  03/24/20 130/78  03/10/20 112/60  01/16/20 122/68   . Pharmacist Clinical Goal(s): o Over the next 90 days, patient will work with PharmD and providers to maintain BP goal <130/80 . Current regimen:  o Hydrochlorothiazide 25 mg daily o Ramipril 2.5 mg daily  . Interventions: o Discussed increasing exercise as tolerated to goal of 30 minutes each day. Patient currently around 20 minutes daily.  o Patient currently walking 1000 steps per day and hoping to increase by 300 steps daily each week for the next few weeks.  . Patient self care activities - Over the next 90 days, patient will: o Check BP weekly, document, and provide at future appointments o Ensure daily salt intake < 2300 mg/day  Hyperlipidemia Lab Results  Component Value Date/Time   LDLCALC 77 12/19/2019 09:35 AM   . Pharmacist Clinical Goal(s): o Over the next 90 days, patient will work with PharmD and providers to achieve LDL goal < 70 . Current regimen:  . Atorvastatin 20 mg daily . Fenofibrate 160 mg daily  . Interventions: o Discussed lifestyle modifications and weight loss goal.  o Encouraged activity goal of 30 minutes each day activity.  . Patient self care activities - Over the next 90 days, patient will: o Continue taking medication as prescribed.  o Contact pharmacist or provider with any questions or concerns.   Diabetes Lab Results  Component Value Date/Time   HGBA1C 6.0 (H) 12/19/2019 09:35 AM   HGBA1C 5.5 09/12/2019 10:22 AM   . Pharmacist Clinical Goal(s): o Over the next 90 days, patient will work  with PharmD and providers to maintain A1c goal <7% . Current regimen:  o Ozempic 1 mg weekly o Metformin 500 mg daily with morning meal . Interventions: o Reviewed home blood sugar readings.  o Discussed healthy eating and activity.  o Patient will begin Ozempic 1 mg weekly.   . Patient self care activities - Over the next 90 days, patient will: o Check blood sugar once daily, document, and provide at future appointments o Contact provider with any episodes of hypoglycemia  Depression . Pharmacist Clinical Goal(s) o Over the next 90 days, patient will work with PharmD and providers to manage symptoms of depression  . Current regimen:  o Trintellix 20 mg daily . Interventions: o Patient has been able to stop taking amitriptyline.  o Patient reports increasing walking to 1000 minutes daily currently.   . Patient self care activities - Over the next 90 days, patient will:  o Consider counseling for symptoms of depression.  o Contact pharmacist or provider with any questions or concerns.   Medication management . Pharmacist Clinical Goal(s): o Over the next 90 days, patient will work with PharmD and providers to achieve optimal medication adherence . Current pharmacy: [facility] . Interventions o Comprehensive medication review performed. o Utilize [facility] for medication synchronization, packaging and delivery . Patient self care activities - Over the next 90 days, patient will: o Focus on medication adherence by pill box o Take medications as prescribed o Report any questions or  concerns to PharmD and/or provider(s)  Please see past updates related to this goal by clicking on the "Past Updates" button in the selected goal         Print copy of patient instructions, educational materials, and care plan provided in person.  Telephone follow up appointment with pharmacy team member scheduled for: January 2022  Burnice Logan, [facility]      Exercises To Do  While Sitting  Exercises that you do while sitting (chair exercises) can give you many of the same benefits as full exercise. Benefits include strengthening your heart, burning calories, and keeping muscles and joints healthy. Exercise can also improve your mood and help with depression and anxiety. You may benefit from chair exercises if you are unable to do standing exercises because of:  Diabetic foot pain.  Obesity.  Illness.  Arthritis.  Recovery from surgery or injury.  Breathing problems.  Balance problems.  Another type of disability. Before starting chair exercises, check with your health care provider or a physical therapist to find out how much exercise you can tolerate and which exercises are safe for you. If your health care provider approves:  Start out slowly and build up over time. Aim to work up to about 10-20 minutes for each exercise session.  Make exercise part of your daily routine.  Drink water when you exercise. Do not wait until you are thirsty. Drink every 10-15 minutes.  Stop exercising right away if you have pain, nausea, shortness of breath, or dizziness.  If you are exercising in a wheelchair, make sure to lock the wheels.  Ask your health care provider whether you can do tai chi or yoga. Many positions in these mind-body exercises can be modified to do while seated. Warm-up Before starting other exercises: 1. Sit up as straight as you can. Have your knees bent at 90 degrees, which is the shape of the capital letter "L." Keep your feet flat on the floor. 2. Sit at the front edge of your chair, if you can. 3. Pull in (tighten) the muscles in your abdomen and stretch your spine and neck as straight as you can. Hold this position for a few minutes. 4. Breathe in and out evenly. Try to concentrate on your breathing, and relax your mind. Stretching Exercise A: Arm stretch 1. Hold your arms out straight in front of your body. 2. Bend your hands at the  wrist with your fingers pointing up, as if signaling someone to stop. Notice the slight tension in your forearms as you hold the position. 3. Keeping your arms out and your hands bent, rotate your hands outward as far as you can and hold this stretch. Aim to have your thumbs pointing up and your pinkie fingers pointing down. Slowly repeat arm stretches for one minute as tolerated. Exercise B: Leg stretch 1. If you can move your legs, try to "draw" letters on the floor with the toes of your foot. Write your name with one foot. 2. Write your name with the toes of your other foot. Slowly repeat the movements for one minute as tolerated. Exercise C: Reach for the sky 1. Reach your hands as far over your head as you can to stretch your spine. 2. Move your hands and arms as if you are climbing a rope. Slowly repeat the movements for one minute as tolerated. Range of motion exercises Exercise A: Shoulder roll 1. Let your arms hang loosely at your sides. 2. Lift just your shoulders up  toward your ears, then let them relax back down. 3. When your shoulders feel loose, rotate your shoulders in backward and forward circles. Do shoulder rolls slowly for one minute as tolerated. Exercise B: March in place 1. As if you are marching, pump your arms and lift your legs up and down. Lift your knees as high as you can. ? If you are unable to lift your knees, just pump your arms and move your ankles and feet up and down. March in place for one minute as tolerated. Exercise C: Seated jumping jacks 1. Let your arms hang down straight. 2. Keeping your arms straight, lift them up over your head. Aim to point your fingers to the ceiling. 3. While you lift your arms, straighten your legs and slide your heels along the floor to your sides, as wide as you can. 4. As you bring your arms back down to your sides, slide your legs back together. ? If you are unable to use your legs, just move your arms. Slowly repeat  seated jumping jacks for one minute as tolerated. Strengthening exercises Exercise A: Shoulder squeeze 1. Hold your arms straight out from your body to your sides, with your elbows bent and your fists pointed at the ceiling. 2. Keeping your arms in the bent position, move them forward so your elbows and forearms meet in front of your face. 3. Open your arms back out as wide as you can with your elbows still bent, until you feel your shoulder blades squeezing together. Hold for 5 seconds. Slowly repeat the movements forward and backward for one minute as tolerated. Contact a health care provider if you:  Had to stop exercising due to any of the following: ? Pain. ? Nausea. ? Shortness of breath. ? Dizziness. ? Fatigue.  Have significant pain or soreness after exercising. Get help right away if you have:  Chest pain.  Difficulty breathing. These symptoms may represent a serious problem that is an emergency. Do not wait to see if the symptoms will go away. Get medical help right away. Call your local emergency services (911 in the [place] Do not drive yourself to the hospital. This information is not intended to replace advice given to you by your health care provider. Make sure you discuss any questions you have with your health care provider. Document Revised: 07/05/2018 Document Reviewed: 01/25/2017 Elsevier Patient Education  2020 [employer].

## 2020-03-24 NOTE — Patient Instructions (Signed)
Increase ozempic to 1 mg once weekly.  Decrease lyrica to 100 mg one twice a day.  Call back if wishes to go to [facility] clinic in [place],

## 2020-03-25 LAB — LIPID PANEL
Chol/HDL Ratio: 3.1 ratio (ref 0.0–4.4)
Cholesterol, Total: 157 mg/dL (ref 100–199)
HDL: 50 mg/dL (ref >39)
LDL Chol Calc (NIH): 91 mg/dL (ref 0–99)
Triglycerides: 82 mg/dL (ref 0–149)
VLDL Cholesterol Cal: 16 mg/dL (ref 5–40)

## 2020-03-25 LAB — COMPREHENSIVE METABOLIC PANEL
ALT: 7 IU/L (ref 0–32)
AST: 11 IU/L (ref 0–40)
Albumin/Globulin Ratio: 1.7 (ref 1.2–2.2)
Albumin: 4 g/dL (ref 3.8–4.8)
Alkaline Phosphatase: 92 IU/L (ref 44–121)
BUN/Creatinine Ratio: 18 (ref 12–28)
BUN: 16 mg/dL (ref 8–27)
Bilirubin Total: 0.3 mg/dL (ref 0.0–1.2)
CO2: 25 mmol/L (ref 20–29)
Calcium: 9.6 mg/dL (ref 8.7–10.3)
Chloride: 99 mmol/L (ref 96–106)
Creatinine, Ser: 0.89 mg/dL (ref 0.57–1.00)
GFR calc Af Amer: 77 mL/min/{1.73_m2} (ref >59)
GFR calc non Af Amer: 67 mL/min/{1.73_m2} (ref >59)
Globulin, Total: 2.4 g/dL (ref 1.5–4.5)
Glucose: 100 mg/dL — ABNORMAL HIGH (ref 65–99)
Potassium: 4.9 mmol/L (ref 3.5–5.2)
Sodium: 138 mmol/L (ref 134–144)
Total Protein: 6.4 g/dL (ref 6.0–8.5)

## 2020-03-25 LAB — CBC WITH DIFFERENTIAL/PLATELET
Basophils Absolute: 0 10*3/uL (ref 0.0–0.2)
Basos: 0 %
EOS (ABSOLUTE): 0.1 10*3/uL (ref 0.0–0.4)
Eos: 1 %
Hematocrit: 39.3 % (ref 34.0–46.6)
Hemoglobin: 12.9 g/dL (ref 11.1–15.9)
Immature Grans (Abs): 0 10*3/uL (ref 0.0–0.1)
Immature Granulocytes: 0 %
Lymphocytes Absolute: 1.9 10*3/uL (ref 0.7–3.1)
Lymphs: 20 %
MCH: 27.9 pg (ref 26.6–33.0)
MCHC: 32.8 g/dL (ref 31.5–35.7)
MCV: 85 fL (ref 79–97)
Monocytes Absolute: 0.5 10*3/uL (ref 0.1–0.9)
Monocytes: 6 %
Neutrophils Absolute: 6.7 10*3/uL (ref 1.4–7.0)
Neutrophils: 73 %
Platelets: 354 10*3/uL (ref 150–450)
RBC: 4.63 x10E6/uL (ref 3.77–5.28)
RDW: 13.8 % (ref 11.7–15.4)
WBC: 9.2 10*3/uL (ref 3.4–10.8)

## 2020-03-25 LAB — HEMOGLOBIN A1C
Est. average glucose Bld gHb Est-mCnc: 123 mg/dL
Hgb A1c MFr Bld: 5.9 % — ABNORMAL HIGH (ref 4.8–5.6)

## 2020-03-25 LAB — CARDIOVASCULAR RISK ASSESSMENT

## 2020-03-29 ENCOUNTER — Other Ambulatory Visit: Payer: Self-pay | Admitting: Family Medicine

## 2020-04-02 ENCOUNTER — Telehealth: Payer: Self-pay

## 2020-04-02 NOTE — Progress Notes (Signed)
Chronic Care Management Pharmacy Assistant   Name: Hailey Greene  MRN: [id number] DOB: 1950-06-05  Reason for Encounter: Medication Review   Left message for Patient to call     PCP : Blane Ohara, MD  Allergies:  No Known Allergies  Medications: Outpatient Encounter Medications as of 04/02/2020  Medication Sig  . acetaminophen (TYLENOL) 500 MG tablet Take 500-1,000 mg by mouth every 6 (six) hours as needed (for pain).  Marland Kitchen aspirin EC 325 MG EC tablet Take 1 tablet (325 mg total) by mouth 2 (two) times daily.  Marland Kitchen atorvastatin (LIPITOR) 20 MG tablet TAKE ONE (1) TABLET ONCE DAILY  . cyclobenzaprine (FLEXERIL) 10 MG tablet TAKE 1 TABLET 3 TIMES A DAY AS NEEDED  . dicyclomine (BENTYL) 20 MG tablet TAKE ONE TABLET EVERY 6 HOURS AS NEEDED  . fenofibrate 160 MG tablet Take 1 tablet (160 mg total) by mouth daily.  . fluticasone (FLONASE) 50 MCG/ACT nasal spray SPRAY 2 SPRAYS INTO EACH NOSTRIL EVERY DAY  . glucose blood (ONETOUCH VERIO) test strip E11.42 Use new test strip each time when checking FBS  . hydrochlorothiazide (HYDRODIURIL) 25 MG tablet TAKE ONE (1) TABLET ONCE DAILY  . Lancets (ONETOUCH DELICA PLUS LANCET33G) MISC E 11.42 Use new lancet each time when checking FBS  . loratadine (ALLERGY RELIEF) 10 MG tablet TAKE ONE (1) TABLET BY MOUTH ONCE DAILY  . metFORMIN (GLUCOPHAGE) 500 MG tablet TAKE 1 TABLET ONCE DAILY WITH MORNING MEAL  . Multiple Vitamin (MULTIVITAMIN WITH MINERALS) TABS tablet Take 1 tablet by mouth daily.  Marland Kitchen omeprazole (PRILOSEC) 20 MG capsule Take 1 capsule (20 mg total) by mouth daily.  . ondansetron (ZOFRAN) 4 MG tablet TAKE ONE TABLET BY MOUTH EVERY EIGHT HOURS AS NEEDED NAUSEA  . pregabalin (LYRICA) 100 MG capsule Take 1 capsule (100 mg total) by mouth 2 (two) times daily.  . ramipril (ALTACE) 2.5 MG capsule Take 1 capsule (2.5 mg total) by mouth daily.  Marland Kitchen rOPINIRole (REQUIP) 3 MG tablet TAKE ONE (1) TABLET BY MOUTH ONCE DAILY (1-3 HOURS BEFORE BEDTIME)   . Semaglutide, 1 MG/DOSE, (OZEMPIC, 1 MG/DOSE,) 2 MG/1.5ML SOPN Inject 1 mg into the skin once a week.  . solifenacin (VESICARE) 5 MG tablet Take 1 tablet (5 mg total) by mouth daily.  . Suvorexant (BELSOMRA) 10 MG TABS TAKE 1 TABLET BY MOUTH WITHIN 30 MINUTES OF BEDTIME. ONLY TAKE IF WITHIN 7 HOURS OF WAKING TIME (Patient not taking: Reported on 03/24/2020)  . SYMBICORT 160-4.5 MCG/ACT inhaler Inhale 2 puffs into the lungs 2 times daily at 12 noon and 4 pm.  . vitamin B-12 1000 MCG tablet Take 1 tablet (1,000 mcg total) by mouth daily.  Marland Kitchen vortioxetine HBr (TRINTELLIX) 20 MG TABS tablet Take 1 tablet (20 mg total) by mouth daily.   No facility-administered encounter medications on file as of 04/02/2020.    Current Diagnosis: Patient Active Problem List   Diagnosis Date Noted  . BMI 40.0-44.9, adult (HCC) 03/10/2020  . Mild recurrent major depression (HCC) 09/12/2019  . Mixed hyperlipidemia 09/12/2019  . Primary insomnia 09/12/2019  . Nausea 08/07/2019  . Status post total knee replacement 07/25/2019  . Acute encephalopathy 07/19/2019  . Hypokalemia 07/19/2019  . Major depressive disorder   . S/P total knee replacement 07/15/2019  . Diarrhea of presumed infectious origin 06/06/2019  . Class 1 obesity due to excess calories with serious comorbidity and body mass index (BMI) of 33.0 to 33.9 in adult 06/02/2019  . Mild intermittent  asthma without complication 06/02/2019  . Medicare annual wellness visit, subsequent 05/31/2019  . Primary osteoarthritis of left knee 05/31/2019  . Diabetic polyneuropathy associated with type 2 diabetes mellitus (HCC) 05/31/2019  . Acute non-recurrent maxillary sinusitis 05/08/2019  . 2019 novel coronavirus disease (COVID-19) 12/31/2018  . Benign essential HTN 12/31/2018  . Chronic pain of left knee 12/11/2018  . Incisional hernia 11/03/2016  . Idiopathic progressive neuropathy 04/11/2014     BP Readings from Last 3 Encounters:  03/24/20 130/78   03/10/20 112/60  01/16/20 122/68    Lab Results  Component Value Date   HGBA1C 5.9 (H) 03/24/2020      Leilani Able, CMA Clinical Pharmacist Assistant [phone]

## 2020-04-06 NOTE — Chronic Care Management (AMB) (Signed)
Chronic Care Management Pharmacy Assistant   Name: Hailey Greene  MRN: [id number] DOB: 1950/06/13  Reason for Encounter: Medication Review  Patient Questions:  1.  Have you seen any other providers since your last visit? No  2.  Any changes in your medicines or health? No   PCP : Blane Ohara, MD  Allergies:  No Known Allergies  Medications: Outpatient Encounter Medications as of 04/02/2020  Medication Sig  . acetaminophen (TYLENOL) 500 MG tablet Take 500-1,000 mg by mouth every 6 (six) hours as needed (for pain).  Marland Kitchen aspirin EC 325 MG EC tablet Take 1 tablet (325 mg total) by mouth 2 (two) times daily.  Marland Kitchen atorvastatin (LIPITOR) 20 MG tablet TAKE ONE (1) TABLET ONCE DAILY  . cyclobenzaprine (FLEXERIL) 10 MG tablet TAKE 1 TABLET 3 TIMES A DAY AS NEEDED  . dicyclomine (BENTYL) 20 MG tablet TAKE ONE TABLET EVERY 6 HOURS AS NEEDED  . fenofibrate 160 MG tablet Take 1 tablet (160 mg total) by mouth daily.  . fluticasone (FLONASE) 50 MCG/ACT nasal spray SPRAY 2 SPRAYS INTO EACH NOSTRIL EVERY DAY  . glucose blood (ONETOUCH VERIO) test strip E11.42 Use new test strip each time when checking FBS  . hydrochlorothiazide (HYDRODIURIL) 25 MG tablet TAKE ONE (1) TABLET ONCE DAILY  . Lancets (ONETOUCH DELICA PLUS LANCET33G) MISC E 11.42 Use new lancet each time when checking FBS  . loratadine (ALLERGY RELIEF) 10 MG tablet TAKE ONE (1) TABLET BY MOUTH ONCE DAILY  . metFORMIN (GLUCOPHAGE) 500 MG tablet TAKE 1 TABLET ONCE DAILY WITH MORNING MEAL  . Multiple Vitamin (MULTIVITAMIN WITH MINERALS) TABS tablet Take 1 tablet by mouth daily.  Marland Kitchen omeprazole (PRILOSEC) 20 MG capsule Take 1 capsule (20 mg total) by mouth daily.  . ondansetron (ZOFRAN) 4 MG tablet TAKE ONE TABLET BY MOUTH EVERY EIGHT HOURS AS NEEDED NAUSEA  . pregabalin (LYRICA) 100 MG capsule Take 1 capsule (100 mg total) by mouth 2 (two) times daily.  . ramipril (ALTACE) 2.5 MG capsule Take 1 capsule (2.5 mg total) by mouth daily.  Marland Kitchen  rOPINIRole (REQUIP) 3 MG tablet TAKE ONE (1) TABLET BY MOUTH ONCE DAILY (1-3 HOURS BEFORE BEDTIME)  . Semaglutide, 1 MG/DOSE, (OZEMPIC, 1 MG/DOSE,) 2 MG/1.5ML SOPN Inject 1 mg into the skin once a week.  . solifenacin (VESICARE) 5 MG tablet Take 1 tablet (5 mg total) by mouth daily.  . Suvorexant (BELSOMRA) 10 MG TABS TAKE 1 TABLET BY MOUTH WITHIN 30 MINUTES OF BEDTIME. ONLY TAKE IF WITHIN 7 HOURS OF WAKING TIME (Patient not taking: Reported on 03/24/2020)  . SYMBICORT 160-4.5 MCG/ACT inhaler Inhale 2 puffs into the lungs 2 times daily at 12 noon and 4 pm.  . vitamin B-12 1000 MCG tablet Take 1 tablet (1,000 mcg total) by mouth daily.  Marland Kitchen vortioxetine HBr (TRINTELLIX) 20 MG TABS tablet Take 1 tablet (20 mg total) by mouth daily.   No facility-administered encounter medications on file as of 04/02/2020.    Current Diagnosis: Patient Active Problem List   Diagnosis Date Noted  . BMI 40.0-44.9, adult (HCC) 03/10/2020  . Mild recurrent major depression (HCC) 09/12/2019  . Mixed hyperlipidemia 09/12/2019  . Primary insomnia 09/12/2019  . Nausea 08/07/2019  . Status post total knee replacement 07/25/2019  . Acute encephalopathy 07/19/2019  . Hypokalemia 07/19/2019  . Major depressive disorder   . S/P total knee replacement 07/15/2019  . Diarrhea of presumed infectious origin 06/06/2019  . Class 1 obesity due to excess calories with  serious comorbidity and body mass index (BMI) of 33.0 to 33.9 in adult 06/02/2019  . Mild intermittent asthma without complication 06/02/2019  . Medicare annual wellness visit, subsequent 05/31/2019  . Primary osteoarthritis of left knee 05/31/2019  . Diabetic polyneuropathy associated with type 2 diabetes mellitus (HCC) 05/31/2019  . Acute non-recurrent maxillary sinusitis 05/08/2019  . 2019 novel coronavirus disease (COVID-19) 12/31/2018  . Benign essential HTN 12/31/2018  . Chronic pain of left knee 12/11/2018  . Incisional hernia 11/03/2016  . Idiopathic  progressive neuropathy 04/11/2014   Reviewed chart for medication changes ahead of medication coordination call.  No OVs, Consults, or hospital visits since last care coordination call/Pharmacist visit.   No medication changes indicated  BP Readings from Last 3 Encounters:  03/24/20 130/78  03/10/20 112/60  01/16/20 122/68    Lab Results  Component Value Date   HGBA1C 5.9 (H) 03/24/2020     Patient obtains medications through [facility]  90 Days   Last adherence delivery included: Atorvastatin 20 mg 1 tablet daily   Dicylclomine 20 mg every 6 hours prn  Fenofibrate 160 mg 1 tablet daily   Hydrochlorothiazide 25 mg 1 tablet daily  Loratadine 10 mg 1 tablet daily   Omeprazole 20 mg 1 tablet daily  Ondansetron 4 mg q8h prn nausea  One Touch Verio test strips daily  One Touch Delica Lancets daily  Ozempic 1 mg/dose (4mg /35mL) Inject in the skin once a week as directed Pregabalin 100 mg 1 capsule twice daily Ramipril 2.5 mg 1 tablet daily Ropinirole 3 mg 1 tablet daily   Solifenacin (Vesicare) 5 mg 1 tablet daily  Trintellix 20 mg 1 tablet daily    Patient declined the following last month  Amitriptyline 25 mg 1 tablet daily as needed  - D/C Amitriptyline 50 mg 1 tablet daily as needed at bedtime- D/C Belsomra 10 mg within 30 mins of bedtime as needed D/C Cyclobenzaprine 10 mg tid prn - PRN has enough on hand Flonase nasal spray - 2 sprays into both nostrils daily - Picked up from [facility] 03/24/20 Metformin 500 mg daily with morning meal  Symbicort 160-4.5 2 puffs bid  - Has enough on hand Victoza 0.6mg /0.62mL Inject 1.2 mg SQ once daily- D/C Victoza 0.6mg /0.14mL inject 1.8 mg SQ once daily- D/C    Patient is due for next adherence delivery on: 04/10/2020 . Called patient and reviewed medications and coordinated delivery.  This delivery to include: Metformin 500 mg 1 tablet daily.  Patient will need an acute fill prior to next adherence delivery of Belsomra 10 mg on 05/04/2020  .  Patient declined the following medications due to receiving 90 day supply on 04/02/2020 Atorvastatin 20 mg 1 tablet daily   Dicylclomine 20 mg every 6 hours prn  Fenofibrate 160 mg 1 tablet daily   Hydrochlorothiazide 25 mg 1 tablet daily  Loratadine 10 mg 1 tablet daily   Omeprazole 20 mg 1 tablet daily  Ondansetron 4 mg q8h prn nausea  One Touch Verio test strips daily  One Touch Delica Lancets daily  Ozempic 1 mg/dose (4mg /73mL) Inject in the skin once a week as directed Pregabalin 100 mg 1 capsule twice daily Ramipril 2.5 mg 1 tablet daily Ropinirole 3 mg 1 tablet daily   Solifenacin (Vesicare) 5 mg 1 tablet daily  Trintellix 20 mg 1 tablet daily     Patient does not need refills for any medications  Confirmed delivery date of 04/10/2020, advised patient that pharmacy will contact them the morning of  delivery.  Patient states she is not checking blood pressure or blood sugar daily therefore she does not have any readings to provide today.   Follow-Up:  Occupational hygienist and Pharmacist Review   Levada Dy. Manson Passey, CPP notified  Jomarie Longs, [facility] Clinical Pharmacy Assistant [phone]

## 2020-04-23 NOTE — Chronic Care Management (AMB) (Unsigned)
Chronic Care Management Pharmacy  Name: Hailey Greene  MRN: [id number] DOB: September 26, 1950  Chief Complaint/ HPI  Hailey Greene,  70 y.o. , female presents for their Follow-Up CCM visit with the clinical pharmacist via telephone due to COVID-19 Pandemic.  PCP : Hailey Brome, MD  Their chronic conditions include: hypertension, asthma, type 2 diabetes, diabetic polyneuropathy, osteoarthritis of knee, major depressive disorder, depression, hyperlipidemia, insomnia.   Office Visits: 03/10/2020 - AWV with Dr. Holly Greene  Consult Visit:  Medications:  No Known Allergies  SDOH Screenings   Alcohol Screen: Not on file  Depression (PHQ2-9): Medium Risk  . PHQ-2 Score: 14  Financial Resource Strain: High Risk  . Difficulty of Paying Living Expenses: Hard  Food Insecurity: No Food Insecurity  . Worried About Charity fundraiser in the Last Year: Never true  . Ran Out of Food in the Last Year: Never true  Housing: Low Risk   . Last Housing Risk Score: 0  Physical Activity: Insufficiently Active  . Days of Exercise per Week: 7 days  . Minutes of Exercise per Session: 20 min  Social Connections: Not on file  Stress: Not on file  Tobacco Use: Medium Risk  . Smoking Tobacco Use: Former Smoker  . Smokeless Tobacco Use: Never Used  Transportation Needs: No Transportation Needs  . Lack of Transportation (Medical): No  . Lack of Transportation (Non-Medical): No   Current Diagnosis/Assessment:  Goals Addressed   None     COPD / Asthma / Tobacco   Eosinophil count:   Lab Results  Component Value Date/Time   EOSPCT 3 07/21/2019 05:29 AM  %                               Eos (Absolute):  Lab Results  Component Value Date/Time   EOSABS 0.1 03/24/2020 10:01 AM    Tobacco Status:  Social History   Tobacco Use  Smoking Status Former Smoker  . Packs/day: 2.00  . Years: 12.00  . Pack years: 24.00  . Types: Cigarettes  . Quit date: 05/25/2010  . Years since quitting: 9.9   Smokeless Tobacco Never Used    Patient has failed these meds in past: none reported  Patient is currently controlled on the following medications:   Flonase nasal spray 2 sprays in both nostrils daily  Loratadine 10 mg daily  Symbicort 160-4.5 mcg/act 2 puffs twice daily   Albuterol prn inhaler use  Using maintenance inhaler regularly? Yes Frequency of rescue inhaler use:  daily  We discussed:  proper inhaler technique. Patient reports symptoms well controlled with current therapy.   Plan  Continue current medications   Diabetes   Recent Relevant Labs: Lab Results  Component Value Date/Time   HGBA1C 5.9 (H) 03/24/2020 10:01 AM   HGBA1C 6.0 (H) 12/19/2019 09:35 AM   MICROALBUR 30 05/31/2019 11:19 AM    Kidney Function Lab Results  Component Value Date/Time   CREATININE 0.89 03/24/2020 10:01 AM   CREATININE 0.84 12/19/2019 09:35 AM   GFRNONAA 67 03/24/2020 10:01 AM   GFRAA 77 03/24/2020 10:01 AM   K 4.9 03/24/2020 10:01 AM   K 4.7 12/19/2019 09:35 AM    Checking BG: Daily  Recent FBG Readings: Recent pre-meal BG readings: highest lately has been 116 mg/dL  Patient has failed these meds in past: none reported Patient is currently controlled on the following medications:   Ozempic 1 mg weekly on Sundays  Metformin 500 mg daily with morning meal  One Touch Verio   One touch Delica lancets  Last diabetic Foot exam: 12/2019 Last diabetic Eye exam: 12/2019  We discussed: diet and exercise extensively.Patient will begin Ozempic 1 mg weekly this Sunday.   Diet: Patient is working to improve diet. She is working on a goal of weight loss. She has had some success the past few months and hopes to be able to continue this success.   Exercise: Patient has increased walking or completing 1000 steps daily. She hopes to slowly increase each week by 300 steps daily.   Plan  Increase Ozempic 1 mg weekly.  Hypertension   BP today is:  <130/80  Office blood  pressures are  BP Readings from Last 3 Encounters:  03/24/20 130/78  03/10/20 112/60  01/16/20 122/68   Kidney Function Lab Results  Component Value Date/Time   CREATININE 0.89 03/24/2020 10:01 AM   CREATININE 0.84 12/19/2019 09:35 AM   GFRNONAA 67 03/24/2020 10:01 AM   GFRAA 77 03/24/2020 10:01 AM   K 4.9 03/24/2020 10:01 AM   K 4.7 12/19/2019 09:35 AM     Patient has failed these meds in the past: none rported Patient is currently controlled on the following medications:   Hydrochlorothiazide 25 mg daily   Ramipril 2.5 mg daily   Patient checks BP at home several times per month  Patient home BP readings are ranging: 120/80  We discussed diet and exercise extensively. Patient had labs checked today.  Plan  Continue current medications.   Hyperlipidemia   LDL goal < 70  Lipid Panel     Component Value Date/Time   CHOL 157 03/24/2020 1001   TRIG 82 03/24/2020 1001   HDL 50 03/24/2020 1001   LDLCALC 91 03/24/2020 1001    Hepatic Function Latest Ref Rng & Units 03/24/2020 12/19/2019 09/12/2019  Total Protein 6.0 - 8.5 g/dL 6.4 6.5 7.2  Albumin 3.8 - 4.8 g/dL 4.0 4.3 3.9  AST 0 - 40 IU/L '11 12 10  ' ALT 0 - 32 IU/L '7 12 8  ' Alk Phosphatase 44 - 121 IU/L 92 128(H) 132(H)  Total Bilirubin 0.0 - 1.2 mg/dL 0.3 0.2 <0.2     The 10-year ASCVD risk score Mikey Bussing DC Jr., et al., 2013) is: 18.2%   Values used to calculate the score:     Age: 63 years     Sex: Female     Is Non-Hispanic African American: No     Diabetic: Yes     Tobacco smoker: No     Systolic Blood Pressure: 276 mmHg     Is BP treated: Yes     HDL Cholesterol: 50 mg/dL     Total Cholesterol: 157 mg/dL   Patient has failed these meds in past: none reported Patient is currently uncontrolled on the following medications:  . Atorvastatin 20 mg daily . Fenofibrate 160 mg daily   We discussed:  diet and exercise extensively. Discussed diet and exercise during visit. Patient had lipid panel drawn today.  Will review results. Patient continuing to work on healthy diet and exercise.   Plan  Continue current medications   Osteoarthritis   Patient has failed these meds in past: meloxicam, naproxen, oxycodone, tramadol  Patient is currently controlled on the following medications:  . Acetaminophen 500 mg - 1-2 tablets by mouth every 6 hours prn pain . Aspirin EC 325 mg bid  We discussed:  Patient reports osteoarthritis pain is well controlled with  current therapy since knee replacement. Patient tolerating walking 20 minutes daily currently. Working to increase to 30 minutes as tolerated.   Plan  Continue current medications  Insomnia   Patient has failed these meds in past: mirtazapine, nortriptyline, Belsomra Patient is currently controlled on the following medications:  . None reported  We discussed:   Reports sleep is much better with exercise. Reports stopping Belsomra and amitriptyline 3 weeks ago. Patient states she is sleeping well with out problems.   Plan  Continue current medications  Major Depression Disorder   PHQ9 SCORE ONLY 03/10/2020 12/22/2019 08/26/2019  PHQ-9 Total Score '14 21 21   ' Patient has failed these meds in past: bupropion, citalopram, desvenlafaxine, duloxetine, amitriptyline, mirtazapine, nortriptyline Patient is currently controlled on the following medications:  . Trintellix 20 mg daily   We discussed:   Patient was able to stop taking amitriptyline 3 weeks ago. She reports well controlled currently. Denies concerns at this time. Trying to stay busy, eat healthy and exercise to keep symptoms well controlled.   Plan  Continue current medications  GERD   Patient has failed these meds in past: none reported Patient is currently controlled on the following medications:  . Omeprazole 20 mg daily   We discussed:  Patient reports medication has been delivered. Reports good control at this time. Pharmacist would like to consider trial of tapering  off or replacing with ranitidine at next visit.  Plan  Continue current medications    Neuropathy   Patient has failed these meds in past: none reported Patient is currently controlled on the following medications:  . Lyrica 100 mg bid  We discussed:  Lyrica dose reduced to 100 mg bid during visit with Dr. Tobie Poet. Patient hopes to eventually cut back to 100 mg daily. Discussed current neuropathy improvement with healthier diet and exercise. Patient states her foot exam with Dr. Tobie Poet today was improved as well. Patient plans to get a good pair of tennis shoes for support.    Plan  Continue current medications   Osteopenia / Osteoporosis   Last DEXA Scan: 04/2019  T-Score femoral neck: -0.8  T-Score lumbar spine: 0.4     No results found for: VD25OH   Patient is not a candidate for pharmacologic treatment  Patient has failed these meds in past: none reported Patient is currently controlled on the following medications:  . None reported  We discussed:  Recommend [phone] units of vitamin D daily. Recommend 1200 mg of calcium daily from dietary and supplemental sources. Recommend weight-bearing and muscle strengthening exercises for building and maintaining bone density.   Patient reports eating cottage cheese often and enjoys dark leafy greens. Encouraged patient to read food labels to determine how much calcium she gets from her diet most days.   Recommend patient consider supplementation with calcium if diet not consistent with calcium intake. Discussed benefits of Vitamin D supplementation. Patient plans to order with next order of supplements from [employer].   Plan  Continue current medications and control with diet and exercise. Consider Vitamin D 1000 unit supplement. Recommend Vitamin D level checked with next labs.    Health Maintenance   Patient is currently controlled on the following medications:   Supplements . Vitamin B-12 1000 mcg daily  supplementation . Multiple Vitamin daily - supplementation PRN Medications . Ondansetron 4 mg every 8 hours prn nausea . Dicyclomine 20 mg every 6 hours prn - stomach pain . Flexeril 10 mg tid prn - muscle pain  We  discussed:  Patient states well controlled. Rarely using prn medications.   Plan  Continue current medications  Vaccines   Reviewed and discussed patient's vaccination history.Patient reports both pneumonia vaccines received at pharmacy. Reports receiving both Shingrix vaccines. She thinks the pharmacy completed her Tetanus shot as well.   Immunization History  Administered Date(s) Administered  . Fluad Quad(high Dose 65+) 12/19/2019  . Influenza-Unspecified 01/17/2018, 12/14/2018  . PFIZER(Purple Top)SARS-COV-2 Vaccination 05/27/2019, 06/17/2019, 12/19/2019  . Pneumococcal Conjugate-13 04/06/2017  . Tdap 03/04/2019  . Zoster Recombinat (Shingrix) 08/02/2017, 04/04/2018    Plan  Recommended patient receive annual flu vaccine in office.   Medication Management   Pt uses [facility] for all medications Uses pill box? Yes Pt endorses good compliance  We discussed: Verbal consent obtained for [facility] enhanced pharmacy services (medication synchronization, adherence packaging, delivery coordination). A medication sync plan was created to allow patient to get all medications delivered once every 30 to 90 days per patient preference. Patient understands they have freedom to choose pharmacy and clinical pharmacist will coordinate care between all prescribers and [facility].   Plan  Utilize [facility] pharmacy for medication synchronization, packaging and delivery    Follow up: 1 month phone visit

## 2020-04-24 ENCOUNTER — Ambulatory Visit: Payer: 59

## 2020-05-04 ENCOUNTER — Telehealth: Payer: Self-pay

## 2020-05-04 NOTE — Progress Notes (Signed)
Chronic Care Management Pharmacy Assistant   Name: Hailey Greene  MRN: [id number] DOB: 01/11/1951  Reason for Encounter: Medication Review for Upstream delivery  Patient Questions:  1.  Have you seen any other providers since your last visit? No  2.  Any changes in your medicines or health? No    PCP : Blane Ohara, MD  Allergies:  No Known Allergies  Medications: Outpatient Encounter Medications as of 05/04/2020  Medication Sig  . acetaminophen (TYLENOL) 500 MG tablet Take 500-1,000 mg by mouth every 6 (six) hours as needed (for pain).  Marland Kitchen aspirin EC 325 MG EC tablet Take 1 tablet (325 mg total) by mouth 2 (two) times daily.  Marland Kitchen atorvastatin (LIPITOR) 20 MG tablet TAKE ONE (1) TABLET ONCE DAILY  . cyclobenzaprine (FLEXERIL) 10 MG tablet TAKE 1 TABLET 3 TIMES A DAY AS NEEDED  . dicyclomine (BENTYL) 20 MG tablet TAKE ONE TABLET EVERY 6 HOURS AS NEEDED  . fenofibrate 160 MG tablet Take 1 tablet (160 mg total) by mouth daily.  . fluticasone (FLONASE) 50 MCG/ACT nasal spray SPRAY 2 SPRAYS INTO EACH NOSTRIL EVERY DAY  . glucose blood (ONETOUCH VERIO) test strip E11.42 Use new test strip each time when checking FBS  . hydrochlorothiazide (HYDRODIURIL) 25 MG tablet TAKE ONE (1) TABLET ONCE DAILY  . Lancets (ONETOUCH DELICA PLUS LANCET33G) MISC E 11.42 Use new lancet each time when checking FBS  . loratadine (ALLERGY RELIEF) 10 MG tablet TAKE ONE (1) TABLET BY MOUTH ONCE DAILY  . metFORMIN (GLUCOPHAGE) 500 MG tablet TAKE 1 TABLET ONCE DAILY WITH MORNING MEAL  . Multiple Vitamin (MULTIVITAMIN WITH MINERALS) TABS tablet Take 1 tablet by mouth daily.  Marland Kitchen omeprazole (PRILOSEC) 20 MG capsule Take 1 capsule (20 mg total) by mouth daily.  . ondansetron (ZOFRAN) 4 MG tablet TAKE ONE TABLET BY MOUTH EVERY EIGHT HOURS AS NEEDED NAUSEA  . pregabalin (LYRICA) 100 MG capsule Take 1 capsule (100 mg total) by mouth 2 (two) times daily.  . ramipril (ALTACE) 2.5 MG capsule Take 1 capsule (2.5 mg total)  by mouth daily.  Marland Kitchen rOPINIRole (REQUIP) 3 MG tablet TAKE ONE (1) TABLET BY MOUTH ONCE DAILY (1-3 HOURS BEFORE BEDTIME)  . Semaglutide, 1 MG/DOSE, (OZEMPIC, 1 MG/DOSE,) 2 MG/1.5ML SOPN Inject 1 mg into the skin once a week.  . solifenacin (VESICARE) 5 MG tablet Take 1 tablet (5 mg total) by mouth daily.  . Suvorexant (BELSOMRA) 10 MG TABS TAKE 1 TABLET BY MOUTH WITHIN 30 MINUTES OF BEDTIME. ONLY TAKE IF WITHIN 7 HOURS OF WAKING TIME (Patient not taking: Reported on 03/24/2020)  . SYMBICORT 160-4.5 MCG/ACT inhaler Inhale 2 puffs into the lungs 2 times daily at 12 noon and 4 pm.  . vitamin B-12 1000 MCG tablet Take 1 tablet (1,000 mcg total) by mouth daily.  Marland Kitchen vortioxetine HBr (TRINTELLIX) 20 MG TABS tablet Take 1 tablet (20 mg total) by mouth daily.   No facility-administered encounter medications on file as of 05/04/2020.    Current Diagnosis: Patient Active Problem List   Diagnosis Date Noted  . BMI 40.0-44.9, adult (HCC) 03/10/2020  . Mild recurrent major depression (HCC) 09/12/2019  . Mixed hyperlipidemia 09/12/2019  . Primary insomnia 09/12/2019  . Nausea 08/07/2019  . Status post total knee replacement 07/25/2019  . Acute encephalopathy 07/19/2019  . Hypokalemia 07/19/2019  . Major depressive disorder   . S/P total knee replacement 07/15/2019  . Diarrhea of presumed infectious origin 06/06/2019  . Class 1 obesity due  to excess calories with serious comorbidity and body mass index (BMI) of 33.0 to 33.9 in adult 06/02/2019  . Mild intermittent asthma without complication 06/02/2019  . Medicare annual wellness visit, subsequent 05/31/2019  . Primary osteoarthritis of left knee 05/31/2019  . Diabetic polyneuropathy associated with type 2 diabetes mellitus (HCC) 05/31/2019  . Acute non-recurrent maxillary sinusitis 05/08/2019  . 2019 novel coronavirus disease (COVID-19) 12/31/2018  . Benign essential HTN 12/31/2018  . Chronic pain of left knee 12/11/2018  . Incisional hernia 11/03/2016   . Idiopathic progressive neuropathy 04/11/2014    Reviewed chart for medication changes ahead of medication coordination call.  No OVs, Consults, or hospital visits since last care coordination call/Pharmacist visit. (If appropriate, list visit date, provider name)  No medication changes indicated OR if recent visit, treatment plan here.  BP Readings from Last 3 Encounters:  03/24/20 130/78  03/10/20 112/60  01/16/20 122/68    Lab Results  Component Value Date   HGBA1C 5.9 (H) 03/24/2020     Patient obtains medications through Vials  90 Days   Last adherence delivery included:  Metformin 500 mg 1 tablet daily  Patient declined (meds) last month due to PRN use/additional supply on hand. 90 day supply on 04/02/2020 Atorvastatin 20 mg1 tabletdaily  Dicylclomine 20 mgevery6 hoursprn  Fenofibrate 160 mg1 tabletdaily  Hydrochlorothiazide 25 mg1 tabletdaily  Loratadine 10 mg1 tabletdaily  Omeprazole 20 mg1 tabletdaily  Ondansetron 4 mg q8h prn nausea  One Touch Veriotest stripsdaily  One Touch Delica Lancets daily Ozempic 1 mg/dose (4mg /87mL) Inject in the skin once a week as directed Pregabalin100 mg1 capsule twice daily Ramipril 2.5 mg 1 tablet daily Ropinirole 3 mg1 tabletdaily  Solifenacin (Vesicare)5 mg 1 tabletdaily  Trintellix 20 mg1 tabletdaily  Explanation of abundance on hand (ie #30 due to overlapping fills or previous adherence issues etc)  Patient is due for next adherence delivery on: 05/05/2020 Called patient and reviewed medications and coordinated delivery.  This delivery to include: Belsomra 10 mg  Ondansetron 4 mg   Patient declined the following medications due to getting 90DS on 04/01/20 Atorvastatin 20 mg1 tabletdaily  Dicylclomine 20 mgevery6 hoursprn  Fenofibrate 160 mg1 tabletdaily  Hydrochlorothiazide 25 mg1 tabletdaily  Loratadine 10 mg1 tabletdaily  Omeprazole 20 mg1 tabletdaily  One Touch Veriotest  stripsdaily  One Touch Delica Lancets daily Ozempic 1 mg/dose (4mg /76mL) Inject in the skin once a week as directed Pregabalin100 mg1 capsule twice daily Ramipril 2.5 mg 1 tablet daily Ropinirole 3 mg1 tabletdaily  Solifenacin (Vesicare)5 mg 1 tabletdaily  Trintellix 20 mg1 tabletdaily   Patient needs refills for None  Confirmed delivery date of 05/05/20, advised patient that pharmacy will contact them the morning of delivery.  Follow-Up:  Coordination of Enhanced Pharmacy Services and Pharmacist Review  1m, CPP notified  07/03/20, [facility] Clinical Pharmacist Assistant [phone]

## 2020-05-19 ENCOUNTER — Other Ambulatory Visit: Payer: Self-pay

## 2020-05-19 ENCOUNTER — Telehealth: Payer: Self-pay

## 2020-05-19 MED ORDER — ONDANSETRON HCL 4 MG PO TABS: ORAL_TABLET | ORAL | 1 refills | Status: DC

## 2020-05-19 NOTE — Progress Notes (Signed)
Chronic Care Management Pharmacy Assistant   Name: Hailey Greene  MRN: [id number] DOB: 1950/09/02  Reason for Encounter: Medication Review for Upstream delivery  Patient Questions:  1.  Have you seen any other providers since your last visit? No  2.  Any changes in your medicines or health? No  PCP : Blane Ohara, MD  Allergies:  No Known Allergies  Medications: Outpatient Encounter Medications as of 05/19/2020  Medication Sig  . acetaminophen (TYLENOL) 500 MG tablet Take 500-1,000 mg by mouth every 6 (six) hours as needed (for pain).  Marland Kitchen aspirin EC 325 MG EC tablet Take 1 tablet (325 mg total) by mouth 2 (two) times daily.  Marland Kitchen atorvastatin (LIPITOR) 20 MG tablet TAKE ONE (1) TABLET ONCE DAILY  . cyclobenzaprine (FLEXERIL) 10 MG tablet TAKE 1 TABLET 3 TIMES A DAY AS NEEDED  . dicyclomine (BENTYL) 20 MG tablet TAKE ONE TABLET EVERY 6 HOURS AS NEEDED  . fenofibrate 160 MG tablet Take 1 tablet (160 mg total) by mouth daily.  . fluticasone (FLONASE) 50 MCG/ACT nasal spray SPRAY 2 SPRAYS INTO EACH NOSTRIL EVERY DAY  . glucose blood (ONETOUCH VERIO) test strip E11.42 Use new test strip each time when checking FBS  . hydrochlorothiazide (HYDRODIURIL) 25 MG tablet TAKE ONE (1) TABLET ONCE DAILY  . Lancets (ONETOUCH DELICA PLUS LANCET33G) MISC E 11.42 Use new lancet each time when checking FBS  . loratadine (ALLERGY RELIEF) 10 MG tablet TAKE ONE (1) TABLET BY MOUTH ONCE DAILY  . metFORMIN (GLUCOPHAGE) 500 MG tablet TAKE 1 TABLET ONCE DAILY WITH MORNING MEAL  . Multiple Vitamin (MULTIVITAMIN WITH MINERALS) TABS tablet Take 1 tablet by mouth daily.  Marland Kitchen omeprazole (PRILOSEC) 20 MG capsule Take 1 capsule (20 mg total) by mouth daily.  . ondansetron (ZOFRAN) 4 MG tablet TAKE ONE TABLET BY MOUTH EVERY EIGHT HOURS AS NEEDED NAUSEA  . pregabalin (LYRICA) 100 MG capsule Take 1 capsule (100 mg total) by mouth 2 (two) times daily.  . ramipril (ALTACE) 2.5 MG capsule Take 1 capsule (2.5 mg total) by  mouth daily.  Marland Kitchen rOPINIRole (REQUIP) 3 MG tablet TAKE ONE (1) TABLET BY MOUTH ONCE DAILY (1-3 HOURS BEFORE BEDTIME)  . Semaglutide, 1 MG/DOSE, (OZEMPIC, 1 MG/DOSE,) 2 MG/1.5ML SOPN Inject 1 mg into the skin once a week.  . solifenacin (VESICARE) 5 MG tablet Take 1 tablet (5 mg total) by mouth daily.  . Suvorexant (BELSOMRA) 10 MG TABS TAKE 1 TABLET BY MOUTH WITHIN 30 MINUTES OF BEDTIME. ONLY TAKE IF WITHIN 7 HOURS OF WAKING TIME (Patient not taking: Reported on 03/24/2020)  . SYMBICORT 160-4.5 MCG/ACT inhaler Inhale 2 puffs into the lungs 2 times daily at 12 noon and 4 pm.  . vitamin B-12 1000 MCG tablet Take 1 tablet (1,000 mcg total) by mouth daily.  Marland Kitchen vortioxetine HBr (TRINTELLIX) 20 MG TABS tablet Take 1 tablet (20 mg total) by mouth daily.   No facility-administered encounter medications on file as of 05/19/2020.    Current Diagnosis: Patient Active Problem List   Diagnosis Date Noted  . BMI 40.0-44.9, adult (HCC) 03/10/2020  . Mild recurrent major depression (HCC) 09/12/2019  . Mixed hyperlipidemia 09/12/2019  . Primary insomnia 09/12/2019  . Nausea 08/07/2019  . Status post total knee replacement 07/25/2019  . Acute encephalopathy 07/19/2019  . Hypokalemia 07/19/2019  . Major depressive disorder   . S/P total knee replacement 07/15/2019  . Diarrhea of presumed infectious origin 06/06/2019  . Class 1 obesity due to excess  calories with serious comorbidity and body mass index (BMI) of 33.0 to 33.9 in adult 06/02/2019  . Mild intermittent asthma without complication 06/02/2019  . Medicare annual wellness visit, subsequent 05/31/2019  . Primary osteoarthritis of left knee 05/31/2019  . Diabetic polyneuropathy associated with type 2 diabetes mellitus (HCC) 05/31/2019  . Acute non-recurrent maxillary sinusitis 05/08/2019  . 2019 novel coronavirus disease (COVID-19) 12/31/2018  . Benign essential HTN 12/31/2018  . Chronic pain of left knee 12/11/2018  . Incisional hernia 11/03/2016   . Idiopathic progressive neuropathy 04/11/2014    Spoke with patient on 05/04/20, and we discussed her medications needed and coordinated her delivery. I spoke with patient today to make sure there was nothing that she needed.   She got 90day refills for her medications on 03/25/20 and 04/11/20, except for Ondansetron and Fluticasone.   Patient stated the only thing she needed was her Ondansetron and Fluticasone nasal spray, the nasal spay she is completely out of.  I told her I would get this coordinated and out for delivery for her.   I have sent the form to Lucia Gaskins, CPP for review.  Follow-Up:  Coordination of Enhanced Pharmacy Services and Pharmacist Review  Lucia Gaskins, CPP notified  Leilani Able, [facility] Clinical Pharmacist Assistant [phone]

## 2020-05-28 ENCOUNTER — Telehealth: Payer: Self-pay

## 2020-05-28 NOTE — Progress Notes (Signed)
Chronic Care Management Pharmacy Assistant   Name: Hailey Greene  MRN: [id number] DOB: Jun 02, 1950  Reason for Encounter: Medication Review for Upstream delivery  Patient Questions:  1.  Have you seen any other providers since your last visit? No  2.  Any changes in your medicines or health? No  PCP : Blane Ohara, MD  Allergies:  No Known Allergies  Medications: Outpatient Encounter Medications as of 05/28/2020  Medication Sig  . acetaminophen (TYLENOL) 500 MG tablet Take 500-1,000 mg by mouth every 6 (six) hours as needed (for pain).  Marland Kitchen aspirin EC 325 MG EC tablet Take 1 tablet (325 mg total) by mouth 2 (two) times daily.  Marland Kitchen atorvastatin (LIPITOR) 20 MG tablet TAKE ONE (1) TABLET ONCE DAILY  . cyclobenzaprine (FLEXERIL) 10 MG tablet TAKE 1 TABLET 3 TIMES A DAY AS NEEDED  . dicyclomine (BENTYL) 20 MG tablet TAKE ONE TABLET EVERY 6 HOURS AS NEEDED  . fenofibrate 160 MG tablet Take 1 tablet (160 mg total) by mouth daily.  . fluticasone (FLONASE) 50 MCG/ACT nasal spray SPRAY 2 SPRAYS INTO EACH NOSTRIL EVERY DAY  . glucose blood (ONETOUCH VERIO) test strip E11.42 Use new test strip each time when checking FBS  . hydrochlorothiazide (HYDRODIURIL) 25 MG tablet TAKE ONE (1) TABLET ONCE DAILY  . Lancets (ONETOUCH DELICA PLUS LANCET33G) MISC E 11.42 Use new lancet each time when checking FBS  . loratadine (ALLERGY RELIEF) 10 MG tablet TAKE ONE (1) TABLET BY MOUTH ONCE DAILY  . metFORMIN (GLUCOPHAGE) 500 MG tablet TAKE 1 TABLET ONCE DAILY WITH MORNING MEAL  . Multiple Vitamin (MULTIVITAMIN WITH MINERALS) TABS tablet Take 1 tablet by mouth daily.  Marland Kitchen omeprazole (PRILOSEC) 20 MG capsule Take 1 capsule (20 mg total) by mouth daily.  . ondansetron (ZOFRAN) 4 MG tablet TAKE ONE TABLET BY MOUTH EVERY EIGHT HOURS AS NEEDED NAUSEA  . pregabalin (LYRICA) 100 MG capsule Take 1 capsule (100 mg total) by mouth 2 (two) times daily.  . ramipril (ALTACE) 2.5 MG capsule Take 1 capsule (2.5 mg total) by  mouth daily.  Marland Kitchen rOPINIRole (REQUIP) 3 MG tablet TAKE ONE (1) TABLET BY MOUTH ONCE DAILY (1-3 HOURS BEFORE BEDTIME)  . Semaglutide, 1 MG/DOSE, (OZEMPIC, 1 MG/DOSE,) 2 MG/1.5ML SOPN Inject 1 mg into the skin once a week.  . solifenacin (VESICARE) 5 MG tablet Take 1 tablet (5 mg total) by mouth daily.  . Suvorexant (BELSOMRA) 10 MG TABS TAKE 1 TABLET BY MOUTH WITHIN 30 MINUTES OF BEDTIME. ONLY TAKE IF WITHIN 7 HOURS OF WAKING TIME (Patient not taking: Reported on 03/24/2020)  . SYMBICORT 160-4.5 MCG/ACT inhaler Inhale 2 puffs into the lungs 2 times daily at 12 noon and 4 pm.  . vitamin B-12 1000 MCG tablet Take 1 tablet (1,000 mcg total) by mouth daily.  Marland Kitchen vortioxetine HBr (TRINTELLIX) 20 MG TABS tablet Take 1 tablet (20 mg total) by mouth daily.   No facility-administered encounter medications on file as of 05/28/2020.    Current Diagnosis: Patient Active Problem List   Diagnosis Date Noted  . BMI 40.0-44.9, adult (HCC) 03/10/2020  . Mild recurrent major depression (HCC) 09/12/2019  . Mixed hyperlipidemia 09/12/2019  . Primary insomnia 09/12/2019  . Nausea 08/07/2019  . Status post total knee replacement 07/25/2019  . Acute encephalopathy 07/19/2019  . Hypokalemia 07/19/2019  . Major depressive disorder   . S/P total knee replacement 07/15/2019  . Diarrhea of presumed infectious origin 06/06/2019  . Class 1 obesity due to excess  calories with serious comorbidity and body mass index (BMI) of 33.0 to 33.9 in adult 06/02/2019  . Mild intermittent asthma without complication 06/02/2019  . Medicare annual wellness visit, subsequent 05/31/2019  . Primary osteoarthritis of left knee 05/31/2019  . Diabetic polyneuropathy associated with type 2 diabetes mellitus (HCC) 05/31/2019  . Acute non-recurrent maxillary sinusitis 05/08/2019  . 2019 novel coronavirus disease (COVID-19) 12/31/2018  . Benign essential HTN 12/31/2018  . Chronic pain of left knee 12/11/2018  . Incisional hernia 11/03/2016   . Idiopathic progressive neuropathy 04/11/2014   Reviewed chart for medication changes ahead of medication coordination call.  No OVs, Consults, or hospital visits since last care coordination call/Pharmacist visit. (If appropriate, list visit date, provider name)  No medication changes indicated OR if recent visit, treatment plan here.  BP Readings from Last 3 Encounters:  03/24/20 130/78  03/10/20 112/60  01/16/20 122/68    Lab Results  Component Value Date   HGBA1C 5.9 (H) 03/24/2020     Patient obtains medications through Vials  90 Days   Last adherence delivery included:  Belsomra 10 mg  Ondansetron 4 mg   Patient declined (meds) last month due to PRN use/additional supply on hand. Atorvastatin 20 mg1 tabletdaily  Dicylclomine 20 mgevery6 hoursprn  Fenofibrate 160 mg1 tabletdaily  Hydrochlorothiazide 25 mg1 tabletdaily  Loratadine 10 mg1 tabletdaily  Omeprazole 20 mg1 tabletdaily  Ondansetron 4 mg q8h prn nausea  One Touch Veriotest stripsdaily  One Touch Delica Lancets daily Ozempic 1 mg/dose (4mg /35mL) Inject in the skin once a week as directed Pregabalin100 mg1 capsule twice daily Ramipril 2.5 mg 1 tablet daily Ropinirole 3 mg1 tabletdaily  Solifenacin (Vesicare)5 mg 1 tabletdaily  Trintellix 20 mg1 tabletdaily  Patient is due for next adherence delivery on: 06/26/20 A no fill form was submitted on 05/28/20  Called patient and reviewed medications and coordinated delivery.  This delivery to include: None got a 90DS on 04/02/20  Patient declined the following medications (meds) due to (reason) torvastatin 20 mg1 tabletdaily  Dicylclomine 20 mgevery6 hoursprn  Fenofibrate 160 mg1 tabletdaily  Hydrochlorothiazide 25 mg1 tabletdaily  Loratadine 10 mg1 tabletdaily  Omeprazole 20 mg1 tabletdaily  Ondansetron 4 mg q8h prn nausea  One Touch Veriotest stripsdaily  One Touch Delica Lancets daily Ozempic 1 mg/dose  (4mg /68mL) Inject in the skin once a week as directed Pregabalin100 mg1 capsule twice daily Ramipril 2.5 mg 1 tablet daily Ropinirole 3 mg1 tabletdaily  Solifenacin (Vesicare)5 mg 1 tabletdaily  Trintellix 20 mg1 tabletdaily  Patient needs refills for: None  Confirmed delivery date of 06/26/20, advised patient that pharmacy will contact them the morning of delivery.  Follow-Up:  1m and Pharmacist Review  08/26/20, CPP  Occupational hygienist, Lucia Gaskins Clinical Pharmacist Assistant [phone]

## 2020-06-09 ENCOUNTER — Telehealth: Payer: Self-pay

## 2020-06-09 NOTE — Progress Notes (Signed)
Chronic Care Management Pharmacy Assistant   Name: Hailey Greene  MRN: [id number] DOB: 1950/12/14   Reason for Encounter:  Medication needed and medication change    Medications: Outpatient Encounter Medications as of 06/09/2020  Medication Sig  . acetaminophen (TYLENOL) 500 MG tablet Take 500-1,000 mg by mouth every 6 (six) hours as needed (for pain).  Marland Kitchen aspirin EC 325 MG EC tablet Take 1 tablet (325 mg total) by mouth 2 (two) times daily.  Marland Kitchen atorvastatin (LIPITOR) 20 MG tablet TAKE ONE (1) TABLET ONCE DAILY  . cyclobenzaprine (FLEXERIL) 10 MG tablet TAKE 1 TABLET 3 TIMES A DAY AS NEEDED  . dicyclomine (BENTYL) 20 MG tablet TAKE ONE TABLET EVERY 6 HOURS AS NEEDED  . fenofibrate 160 MG tablet Take 1 tablet (160 mg total) by mouth daily.  . fluticasone (FLONASE) 50 MCG/ACT nasal spray SPRAY 2 SPRAYS INTO EACH NOSTRIL EVERY DAY  . glucose blood (ONETOUCH VERIO) test strip E11.42 Use new test strip each time when checking FBS  . hydrochlorothiazide (HYDRODIURIL) 25 MG tablet TAKE ONE (1) TABLET ONCE DAILY  . Lancets (ONETOUCH DELICA PLUS LANCET33G) MISC E 11.42 Use new lancet each time when checking FBS  . loratadine (ALLERGY RELIEF) 10 MG tablet TAKE ONE (1) TABLET BY MOUTH ONCE DAILY  . metFORMIN (GLUCOPHAGE) 500 MG tablet TAKE 1 TABLET ONCE DAILY WITH MORNING MEAL  . Multiple Vitamin (MULTIVITAMIN WITH MINERALS) TABS tablet Take 1 tablet by mouth daily.  Marland Kitchen omeprazole (PRILOSEC) 20 MG capsule Take 1 capsule (20 mg total) by mouth daily.  . ondansetron (ZOFRAN) 4 MG tablet TAKE ONE TABLET BY MOUTH EVERY EIGHT HOURS AS NEEDED NAUSEA  . pregabalin (LYRICA) 100 MG capsule Take 1 capsule (100 mg total) by mouth 2 (two) times daily.  . ramipril (ALTACE) 2.5 MG capsule Take 1 capsule (2.5 mg total) by mouth daily.  Marland Kitchen rOPINIRole (REQUIP) 3 MG tablet TAKE ONE (1) TABLET BY MOUTH ONCE DAILY (1-3 HOURS BEFORE BEDTIME)  . Semaglutide, 1 MG/DOSE, (OZEMPIC, 1 MG/DOSE,) 2 MG/1.5ML SOPN Inject 1  mg into the skin once a week.  . solifenacin (VESICARE) 5 MG tablet Take 1 tablet (5 mg total) by mouth daily.  . Suvorexant (BELSOMRA) 10 MG TABS TAKE 1 TABLET BY MOUTH WITHIN 30 MINUTES OF BEDTIME. ONLY TAKE IF WITHIN 7 HOURS OF WAKING TIME (Patient not taking: Reported on 03/24/2020)  . SYMBICORT 160-4.5 MCG/ACT inhaler Inhale 2 puffs into the lungs 2 times daily at 12 noon and 4 pm.  . vitamin B-12 1000 MCG tablet Take 1 tablet (1,000 mcg total) by mouth daily.  Marland Kitchen vortioxetine HBr (TRINTELLIX) 20 MG TABS tablet Take 1 tablet (20 mg total) by mouth daily.   No facility-administered encounter medications on file as of 06/09/2020.    Patient called and stated she is requesting Dr. Sedalia Muta send in a script for a sleeping pill, some antibiotics for her sore throat and new stomach medication, as to the one she is currently taking is not working.  She stated she had had flu like symptoms and sore throat for a few days now along with a headache.   I advise here that Dr. Sedalia Muta would not likely call in medication with our seeing her first, but I would reach out to Lucia Gaskins, CPP.  The patient stated she has an appointment next week but didn't want to wait.  I suggested she may be able to do a phone visit.  I have informed Lucia Gaskins, CPP of  what the patient is requesting.  Lucia Gaskins, CPP stated she would in fact need to schedule and appointment, this could be a televisit, and if needed she may need to come to the office to be swabbed.   I have called the patient to let her know. She stated she would call the office today (06/09/20) to schedule and appointment.   Leilani Able, CMA Clinical Pharmacist Assistant [phone]

## 2020-06-13 ENCOUNTER — Other Ambulatory Visit: Payer: Self-pay | Admitting: Family Medicine

## 2020-06-23 NOTE — Progress Notes (Signed)
Subjective:  Patient ID: Hailey Greene, female    DOB: 19-Jul-1950  Age: 70 y.o. MRN: [id number]  Chief Complaint  Patient presents with  . Diabetes  . Hypertension  . Hyperlipidemia    HPI Diabetic polyneuropathy associated with type 2 diabetes mellitus (HCC) Patient is not checking her sugars.  Her A1c's have been good. Denies excessive thirst, hunger, or urination. The patient has worked very hard on eating healthy and has lost weight was frustrated because she does not seem to be able to continue to lose weight.  She is requesting to start on phentermine.  She did well on this last year prior to her knee surgeries.  Currently taking metformin 500 mg once daily in a.m., Ozempic 1 mg once a week.  Lyrica has decreased to 100 mg twice daily.  Symptoms seem to be fairly well controlled.    Essential hypertension HCTZ 25 mg once daily, ramipril 2.5 mg once daily. Blood pressure is well controlled.  Mixed hyperlipidemia Lipitor 20 mg once daily, aspirin 81 mg once daily, and fenofibrate 160 mg once daily. Eats a low-fat diet.  Moderate recurrent major depressive disorder, without psychotic features (HCC) Currently on Trintellix 20 mg daily.  She is having difficulty sleeping with Belsomra.  Gastroesophageal reflux disease without esophagitis Omeprazole 20 mg once daily  RLS (restless legs syndrome) Requip 3 mg 1 tablet before bed. Allergic rhinitis currently taking Claritin and Flonase.  Asthma: On Symbicort 2 puffs twice daily. Current Outpatient Medications on File Prior to Visit  Medication Sig Dispense Refill  . acetaminophen (TYLENOL) 500 MG tablet Take 500-1,000 mg by mouth every 6 (six) hours as needed (for pain). (Patient not taking: Reported on 06/24/2020)    . atorvastatin (LIPITOR) 20 MG tablet TAKE ONE (1) TABLET ONCE DAILY 90 tablet 1  . cyclobenzaprine (FLEXERIL) 10 MG tablet TAKE 1 TABLET 3 TIMES A DAY AS NEEDED (Patient not taking: Reported on 06/24/2020) 270  tablet 1  . dicyclomine (BENTYL) 20 MG tablet TAKE ONE TABLET EVERY 6 HOURS AS NEEDED 360 tablet 1  . fenofibrate 160 MG tablet Take 1 tablet (160 mg total) by mouth daily. 90 tablet 1  . fluticasone (FLONASE) 50 MCG/ACT nasal spray SPRAY 2 SPRAYS INTO EACH NOSTRIL EVERY DAY 48 mL 2  . hydrochlorothiazide (HYDRODIURIL) 25 MG tablet TAKE ONE (1) TABLET ONCE DAILY 90 tablet 1  . loratadine (ALLERGY RELIEF) 10 MG tablet TAKE ONE (1) TABLET BY MOUTH ONCE DAILY 90 tablet 3  . Multiple Vitamin (MULTIVITAMIN WITH MINERALS) TABS tablet Take 1 tablet by mouth daily.    Marland Kitchen omeprazole (PRILOSEC) 20 MG capsule Take 1 capsule (20 mg total) by mouth daily. 90 capsule 3  . ondansetron (ZOFRAN) 4 MG tablet TAKE ONE TABLET BY MOUTH EVERY EIGHT HOURS AS NEEDED NAUSEA 90 tablet 1  . ramipril (ALTACE) 2.5 MG capsule Take 1 capsule (2.5 mg total) by mouth daily. 90 capsule 1  . rOPINIRole (REQUIP) 3 MG tablet TAKE ONE TABLET BY MOUTH ONCE DAILY 1-3 hours BEFORE bedtime 90 tablet 1  . Semaglutide, 1 MG/DOSE, (OZEMPIC, 1 MG/DOSE,) 2 MG/1.5ML SOPN Inject 1 mg into the skin once a week. 6 mL 1  . solifenacin (VESICARE) 5 MG tablet TAKE ONE TABLET BY MOUTH ONCE DAILY 90 tablet 1  . SYMBICORT 160-4.5 MCG/ACT inhaler Inhale 2 puffs into the lungs 2 times daily at 12 noon and 4 pm. 1 each 5  . vitamin B-12 1000 MCG tablet Take 1 tablet (1,000 mcg  total) by mouth daily. 30 tablet 0  . vortioxetine HBr (TRINTELLIX) 20 MG TABS tablet Take 1 tablet (20 mg total) by mouth daily. 90 tablet 1   No current facility-administered medications on file prior to visit.   Past Medical History:  Diagnosis Date  . Acute hypoxemic respiratory failure due to COVID-19 (HCC)   . Chicken pox   . Depression   . Diabetes (HCC)   . GERD (gastroesophageal reflux disease)   . Hypertension   . Idiopathic progressive neuropathy   . Major depressive disorder   . Mixed hyperlipidemia   . Neuropathy   . Osteoarthritis   . Pneumonia   . Primary  insomnia   . RLS (restless legs syndrome)   . Tachycardia   . Urge incontinence   . UTI (lower urinary tract infection)   . Weakness    Past Surgical History:  Procedure Laterality Date  . ABDOMINAL HYSTERECTOMY    . APPENDECTOMY    . CHOLECYSTECTOMY    . HERNIA REPAIR    . REPLACEMENT TOTAL KNEE Right   . TONSILLECTOMY    . TOTAL KNEE ARTHROPLASTY Left 07/15/2019   Procedure: TOTAL KNEE ARTHROPLASTY;  Surgeon: Dannielle HuhLucey, Steve, MD;  Location: [facility];  Service: Orthopedics;  Laterality: Left;    Family History  Problem Relation Age of Onset  . Thyroid disease Mother   . Cancer Mother   . Migraines Mother   . Brain cancer Father   . Heart disease Maternal Grandmother   . Diabetes Daughter    Social History   Socioeconomic History  . Marital status: Married    Spouse name: Not on file  . Number of children: 3  . Years of education: 5812  . Highest education level: Not on file  Occupational History  . Occupation: Housewife  Tobacco Use  . Smoking status: Former Smoker    Packs/day: 2.00    Years: 12.00    Pack years: 24.00    Types: Cigarettes    Quit date: 05/25/2010    Years since quitting: 10.1  . Smokeless tobacco: Never Used  Vaping Use  . Vaping Use: Never used  Substance and Sexual Activity  . Alcohol use: Yes    Comment: occasional  . Drug use: No  . Sexual activity: Not on file  Other Topics Concern  . Not on file  Social History Narrative   [place] Currently lives in a house with her husband. 1 dog. Fun: Garden, feed birds, swimming   Denies any religious beliefs effecting health care.    Social Determinants of Health   Financial Resource Strain: High Risk  . Difficulty of Paying Living Expenses: Hard  Food Insecurity: No Food Insecurity  . Worried About Programme researcher, broadcasting/film/videounning Out of Food in the Last Year: Never true  . Ran Out of Food in the Last Year: Never true  Transportation Needs: No Transportation Needs  . Lack of Transportation  (Medical): No  . Lack of Transportation (Non-Medical): No  Physical Activity: Insufficiently Active  . Days of Exercise per Week: 7 days  . Minutes of Exercise per Session: 20 min  Stress: Not on file  Social Connections: Not on file    Review of Systems  Constitutional: Positive for fatigue. Negative for chills and fever.  HENT: Positive for rhinorrhea. Negative for congestion, ear pain and sore throat.   Respiratory: Positive for cough (chronic). Negative for shortness of breath.   Cardiovascular: Negative for chest pain.  Gastrointestinal: Positive  for abdominal pain (relieved by bentyl ). Negative for constipation, diarrhea, nausea and vomiting.  Genitourinary: Negative for dysuria and urgency.  Musculoskeletal: Negative for back pain and myalgias.  Neurological: Positive for light-headedness. Negative for dizziness, weakness and headaches.  Psychiatric/Behavioral: Negative for dysphoric mood. The patient is nervous/anxious.      Objective:  BP (!) 110/58   Pulse 88   Temp (!) 97.5 F (36.4 C)   Resp 18   Ht 5\' 3"  (1.6 m)   Wt 221 lb (100.2 kg)   BMI 39.15 kg/m   BP/Weight 06/24/2020 03/24/2020 03/10/2020  Systolic BP 110 130 112  Diastolic BP 58 78 60  Wt. (Lbs) 221 226 228.6  BMI 39.15 40.03 40.49    Physical Exam Vitals reviewed.  Constitutional:      Appearance: Normal appearance. She is normal weight.  Neck:     Vascular: No carotid bruit.  Cardiovascular:     Rate and Rhythm: Normal rate and regular rhythm.     Pulses: Normal pulses.     Heart sounds: Normal heart sounds.  Pulmonary:     Effort: Pulmonary effort is normal. No respiratory distress.     Breath sounds: Normal breath sounds.  Abdominal:     General: Abdomen is flat. Bowel sounds are normal.     Palpations: Abdomen is soft.     Tenderness: There is no abdominal tenderness.  Neurological:     Mental Status: She is alert and oriented to person, place, and time.  Psychiatric:        Mood  and Affect: Mood normal.        Behavior: Behavior normal.     Diabetic Foot Exam - Simple   No data filed      Lab Results  Component Value Date   WBC 10.5 06/24/2020   HGB 12.8 06/24/2020   HCT 39.0 06/24/2020   PLT 350 06/24/2020   GLUCOSE 86 06/24/2020   CHOL 141 06/24/2020   TRIG 119 06/24/2020   HDL 52 06/24/2020   LDLCALC 68 06/24/2020   ALT 11 06/24/2020   AST 15 06/24/2020   NA 138 06/24/2020   K 4.4 06/24/2020   CL 98 06/24/2020   CREATININE 1.00 06/24/2020   BUN 11 06/24/2020   CO2 23 06/24/2020   TSH 0.885 07/19/2019   HGBA1C 5.7 (H) 06/24/2020   MICROALBUR 30 05/31/2019      Assessment & Plan:   1. Diabetic polyneuropathy associated with type 2 diabetes mellitus (HCC) Control: great Recommend check feet daily. Recommend annual eye exams. Medicines: No changes Continue to work on eating a healthy diet and exercise.  Labs drawn today.   - Hemoglobin A1c - CBC with Differential/Platelet  2. Essential hypertension Well controlled.  No changes to medicines.  Continue to work on eating a healthy diet and exercise.  Labs drawn today.  - Comprehensive metabolic panel  3. Mixed hyperlipidemia Well controlled.  No changes to medicines.  Continue to work on eating a healthy diet and exercise.  Labs drawn today.  - Lipid panel  4. Moderate recurrent major depression (HCC) The current medical regimen is fairly effective;  continue present plan and medications. She feels if she can get some good sleep and help with weight loss, she will have less depression.  Start on trazodone 50 mg once daily at night. Stop belsomra.  5. Gastroesophageal reflux disease without esophagitis The current medical regimen is effective;  continue present plan and medications.  6. RLS (restless  legs syndrome) The current medical regimen is effective;  continue present plan and medications.  7. Visit for screening mammogram - MM DIGITAL SCREENING BILATERAL  8.  Encounter for hepatitis C screening test for low risk patient - Hepatitis panel, acute  9. Non-seasonal allergic rhinitis due to pollen The current medical regimen is effective;  continue present plan and medications.  10. Mild persistent asthma without complication The current medical regimen is effective;  continue present plan and medications.  11. Severe obesity with body mass index (BMI) of 35.0 to 39.9 with serious comorbidity (HCC) Start on phentermine 37.5 mg once daily.  Recommend continue to work on eating healthy diet and exercise.   Meds ordered this encounter  Medications  . traZODone (DESYREL) 50 MG tablet    Sig: Take 0.5-1 tablets (25-50 mg total) by mouth at bedtime as needed for sleep.    Dispense:  30 tablet    Refill:  5    Orders Placed This Encounter  Procedures  . MM DIGITAL SCREENING BILATERAL  . Lipid panel  . Hemoglobin A1c  . Comprehensive metabolic panel  . CBC with Differential/Platelet  . Hepatitis panel, acute  . Cardiovascular Risk Assessment    I reviewed by the computer Follow-up: Return in about 3 months (around 09/24/2020) for fasting.  An After Visit Summary was printed and given to the patient.  Blane Ohara, MD [facility] [phone]

## 2020-06-24 ENCOUNTER — Ambulatory Visit (INDEPENDENT_AMBULATORY_CARE_PROVIDER_SITE_OTHER): Payer: 59 | Admitting: Family Medicine

## 2020-06-24 ENCOUNTER — Ambulatory Visit (INDEPENDENT_AMBULATORY_CARE_PROVIDER_SITE_OTHER): Payer: 59

## 2020-06-24 ENCOUNTER — Telehealth: Payer: Self-pay | Admitting: Family Medicine

## 2020-06-24 ENCOUNTER — Other Ambulatory Visit: Payer: Self-pay

## 2020-06-24 ENCOUNTER — Telehealth: Payer: Self-pay

## 2020-06-24 VITALS — BP 110/58 | HR 88 | Temp 97.5°F | Resp 18 | Ht 63.0 in | Wt 221.0 lb

## 2020-06-24 DIAGNOSIS — E782 Mixed hyperlipidemia: Secondary | ICD-10-CM | POA: Diagnosis not present

## 2020-06-24 DIAGNOSIS — E1142 Type 2 diabetes mellitus with diabetic polyneuropathy: Secondary | ICD-10-CM

## 2020-06-24 DIAGNOSIS — G2581 Restless legs syndrome: Secondary | ICD-10-CM

## 2020-06-24 DIAGNOSIS — I10 Essential (primary) hypertension: Secondary | ICD-10-CM

## 2020-06-24 DIAGNOSIS — J301 Allergic rhinitis due to pollen: Secondary | ICD-10-CM

## 2020-06-24 DIAGNOSIS — Z1159 Encounter for screening for other viral diseases: Secondary | ICD-10-CM

## 2020-06-24 DIAGNOSIS — K219 Gastro-esophageal reflux disease without esophagitis: Secondary | ICD-10-CM

## 2020-06-24 DIAGNOSIS — F332 Major depressive disorder, recurrent severe without psychotic features: Secondary | ICD-10-CM

## 2020-06-24 DIAGNOSIS — J4530 Mild persistent asthma, uncomplicated: Secondary | ICD-10-CM

## 2020-06-24 DIAGNOSIS — Z1231 Encounter for screening mammogram for malignant neoplasm of breast: Secondary | ICD-10-CM

## 2020-06-24 DIAGNOSIS — F331 Major depressive disorder, recurrent, moderate: Secondary | ICD-10-CM

## 2020-06-24 MED ORDER — ONETOUCH DELICA PLUS LANCET33G MISC: 2 refills | Status: DC

## 2020-06-24 MED ORDER — ONETOUCH VERIO W/DEVICE KIT: PACK | 0 refills | Status: DC

## 2020-06-24 MED ORDER — METFORMIN HCL 500 MG PO TABS: ORAL_TABLET | ORAL | 1 refills | Status: DC

## 2020-06-24 MED ORDER — ONETOUCH VERIO VI STRP: ORAL_STRIP | 2 refills | Status: DC

## 2020-06-24 MED ORDER — TRAZODONE HCL 50 MG PO TABS: 25.0000 mg | ORAL_TABLET | Freq: Every evening | ORAL | 5 refills | Status: DC | PRN

## 2020-06-24 MED ORDER — PREGABALIN 100 MG PO CAPS: 100.0000 mg | ORAL_CAPSULE | Freq: Two times a day (BID) | ORAL | 0 refills | Status: DC

## 2020-06-24 NOTE — Progress Notes (Signed)
Chronic Care Management Pharmacy Note  06/24/2020 Name:  Hailey Greene MRN:  [id number] DOB:  1951/02/19   Plan Update:   Patient's insurance pays for [employer] and [employer] testing supplies.   Patient is requesting a refill on phentermine for weight loss.   Pharmacist coordinating delivery of trazodone this week.   Patient is still hoping to reduce medications at future visits. She does not feel like omeprazole is optional this time but will discuss at follow-up to see if symptoms well managed by healthier eating and weight loss.   Subjective: Hailey Greene is an 70 y.o. year old female who is a primary patient of Cox, Kirsten, MD.  The CCM team was consulted for assistance with disease management and care coordination needs.    Engaged with patient face to face for follow up visit in response to provider referral for pharmacy case management and/or care coordination services.   Consent to Services:  The patient was given information about Chronic Care Management services, agreed to services, and gave verbal consent prior to initiation of services.  Please see initial visit note for detailed documentation.   Patient Care Team: Rochel Brome, MD as PCP - General (Family Medicine) Vickey Huger, MD as Consulting Physician (Orthopedic Surgery)  Recent office visits: Visit today in office prior to pharmacist visit  Recent consult visits: None reported  Hospital visits: None in previous 6 months  Objective:  Lab Results  Component Value Date   CREATININE 0.89 03/24/2020   BUN 16 03/24/2020   GFRNONAA 67 03/24/2020   GFRAA 77 03/24/2020   NA 138 03/24/2020   K 4.9 03/24/2020   CALCIUM 9.6 03/24/2020   CO2 25 03/24/2020   GLUCOSE 100 (H) 03/24/2020    Lab Results  Component Value Date/Time   HGBA1C 5.9 (H) 03/24/2020 10:01 AM   HGBA1C 6.0 (H) 12/19/2019 09:35 AM   MICROALBUR 30 05/31/2019 11:19 AM    Last diabetic Eye exam: No results found for: HMDIABEYEEXA   Last diabetic Foot exam: No results found for: HMDIABFOOTEX   Lab Results  Component Value Date   CHOL 157 03/24/2020   HDL 50 03/24/2020   LDLCALC 91 03/24/2020   TRIG 82 03/24/2020   CHOLHDL 3.1 03/24/2020    Hepatic Function Latest Ref Rng & Units 03/24/2020 12/19/2019 09/12/2019  Total Protein 6.0 - 8.5 g/dL 6.4 6.5 7.2  Albumin 3.8 - 4.8 g/dL 4.0 4.3 3.9  AST 0 - 40 IU/L '11 12 10  ' ALT 0 - 32 IU/L '7 12 8  ' Alk Phosphatase 44 - 121 IU/L 92 128(H) 132(H)  Total Bilirubin 0.0 - 1.2 mg/dL 0.3 0.2 <0.2    Lab Results  Component Value Date/Time   TSH 0.885 07/19/2019 10:00 PM   TSH 1.520 05/31/2019 11:20 AM   TSH 3.984 01/07/2014 08:45 AM    CBC Latest Ref Rng & Units 03/24/2020 12/19/2019 09/12/2019  WBC 3.4 - 10.8 x10E3/uL 9.2 8.1 8.6  Hemoglobin 11.1 - 15.9 g/dL 12.9 13.1 13.3  Hematocrit 34.0 - 46.6 % 39.3 40.6 39.9  Platelets 150 - 450 x10E3/uL 354 296 323    No results found for: VD25OH  Clinical ASCVD: No  The 10-year ASCVD risk score Mikey Bussing DC Jr., et al., 2013) is: 14.9%   Values used to calculate the score:     Age: 67 years     Sex: Female     Is Non-Hispanic African American: No     Diabetic: Yes     Tobacco  smoker: No     Systolic Blood Pressure: 615 mmHg     Is BP treated: Yes     HDL Cholesterol: 50 mg/dL     Total Cholesterol: 157 mg/dL    Depression screen [facility] 2/9 06/24/2020 03/10/2020 12/22/2019  Decreased Interest '3 1 3  ' Down, Depressed, Hopeless 2 3 -  PHQ - 2 Score '5 4 3  ' Altered sleeping 2 0 3  Tired, decreased energy '2 3 3  ' Change in appetite 0 3 3  Feeling bad or failure about yourself  '1 3 3  ' Trouble concentrating '1 1 3  ' Moving slowly or fidgety/restless 2 0 3  Suicidal thoughts 0 0 0  PHQ-9 Score '13 14 21  ' Difficult doing work/chores Somewhat difficult - Extremely dIfficult     Social History   Tobacco Use  Smoking Status Former Smoker  . Packs/day: 2.00  . Years: 12.00  . Pack years: 24.00  . Types: Cigarettes  . Quit date:  05/25/2010  . Years since quitting: 10.0  Smokeless Tobacco Never Used   BP Readings from Last 3 Encounters:  06/24/20 (!) 110/58  03/24/20 130/78  03/10/20 112/60   Pulse Readings from Last 3 Encounters:  06/24/20 88  03/24/20 81  03/10/20 67   Wt Readings from Last 3 Encounters:  06/24/20 221 lb (100.2 kg)  03/24/20 226 lb (102.5 kg)  03/10/20 228 lb 9.6 oz (103.7 kg)   BMI Readings from Last 3 Encounters:  06/24/20 39.15 kg/m  03/24/20 40.03 kg/m  03/10/20 40.49 kg/m    Assessment/Interventions: Review of patient past medical history, allergies, medications, health status, including review of consultants reports, laboratory and other test data, was performed as part of comprehensive evaluation and provision of chronic care management services.   SDOH:  (Social Determinants of Health) assessments and interventions performed: Yes  SDOH Screenings   Alcohol Screen: Not on file  Depression (PHQ2-9): Medium Risk  . PHQ-2 Score: 13  Financial Resource Strain: High Risk  . Difficulty of Paying Living Expenses: Hard  Food Insecurity: No Food Insecurity  . Worried About Charity fundraiser in the Last Year: Never true  . Ran Out of Food in the Last Year: Never true  Housing: Low Risk   . Last Housing Risk Score: 0  Physical Activity: Insufficiently Active  . Days of Exercise per Week: 7 days  . Minutes of Exercise per Session: 20 min  Social Connections: Not on file  Stress: Not on file  Tobacco Use: Medium Risk  . Smoking Tobacco Use: Former Smoker  . Smokeless Tobacco Use: Never Used  Transportation Needs: No Transportation Needs  . Lack of Transportation (Medical): No  . Lack of Transportation (Non-Medical): No    CCM Care Plan  No Known Allergies  Medications Reviewed Today    Reviewed by Burnice Logan, [facility] (Pharmacist) on 06/24/20 at 1137  Med List Status: <None>  Medication Order Taking? Sig Documenting Provider Last Dose Status Informant  acetaminophen  (TYLENOL) 500 MG tablet [id number] No Take 500-1,000 mg by mouth every 6 (six) hours as needed (for pain).  Patient not taking: Reported on 06/24/2020   [provider] Not Taking Active Spouse/Significant Other  ASPIRIN 81 PO [id number] Yes Take 81 mg by mouth daily. [provider] Taking Active   atorvastatin (LIPITOR) 20 MG tablet [id number] Yes TAKE ONE (1) TABLET ONCE DAILY Cox, Kirsten, MD Taking Active   cyclobenzaprine (FLEXERIL) 10 MG tablet [id number] No TAKE 1 TABLET 3 TIMES  A DAY AS NEEDED  Patient not taking: Reported on 06/24/2020   Rochel Brome, MD Not Taking Active   dicyclomine (BENTYL) 20 MG tablet [id number] Yes TAKE ONE TABLET EVERY 6 HOURS AS NEEDED Cox, Kirsten, MD Taking Active   fenofibrate 160 MG tablet [id number] Yes Take 1 tablet (160 mg total) by mouth daily. Cox, Kirsten, MD Taking Active   fluticasone [facility]) 50 MCG/ACT nasal spray [id number] Yes SPRAY 2 SPRAYS INTO EACH NOSTRIL EVERY DAY Cox, Kirsten, MD Taking Active   glucose blood (ONETOUCH VERIO) test strip [id number] No E11.42 Use new test strip each time when checking FBS  Patient not taking: Reported on 06/24/2020   Rochel Brome, MD Not Taking Active   hydrochlorothiazide (HYDRODIURIL) 25 MG tablet [id number] Yes TAKE ONE (1) TABLET ONCE DAILY Cox, Kirsten, MD Taking Active   Lancets (ONETOUCH DELICA PLUS DTOIZT24P) [place] [id number] No E 11.42 Use new lancet each time when checking FBS  Patient not taking: Reported on 06/24/2020   Rochel Brome, MD Not Taking Active   loratadine (ALLERGY RELIEF) 10 MG tablet [id number] Yes TAKE ONE (1) TABLET BY MOUTH ONCE DAILY Cox, Kirsten, MD Taking Active   metFORMIN (GLUCOPHAGE) 500 MG tablet [id number] Yes TAKE 1 TABLET ONCE DAILY WITH MORNING MEAL Cox, Kirsten, MD Taking Active   Multiple Vitamin (MULTIVITAMIN WITH MINERALS) TABS tablet [id number] Yes Take 1 tablet by mouth daily. [provider] Taking Active Spouse/Significant Other  omeprazole (PRILOSEC)  20 MG capsule [id number] Yes Take 1 capsule (20 mg total) by mouth daily. Cox, Kirsten, MD Taking Active   ondansetron (ZOFRAN) 4 MG tablet [id number] Yes TAKE ONE TABLET BY MOUTH EVERY EIGHT HOURS AS NEEDED NAUSEA Cox, Kirsten, MD Taking Active   pregabalin (LYRICA) 100 MG capsule [id number] Yes Take 1 capsule (100 mg total) by mouth 2 (two) times daily. Cox, Kirsten, MD Taking Active   ramipril (ALTACE) 2.5 MG capsule [id number] Yes Take 1 capsule (2.5 mg total) by mouth daily. Cox, Kirsten, MD Taking Active   rOPINIRole (REQUIP) 3 MG tablet [id number] Yes TAKE ONE TABLET BY MOUTH ONCE DAILY 1-3 hours BEFORE bedtime Cox, Kirsten, MD Taking Active   Semaglutide, 1 MG/DOSE, (OZEMPIC, 1 MG/DOSE,) 2 MG/1.5ML SOPN [id number] Yes Inject 1 mg into the skin once a week. Cox, Kirsten, MD Taking Active   solifenacin (VESICARE) 5 MG tablet [id number] Yes TAKE ONE TABLET BY MOUTH ONCE DAILY Cox, Elnita Maxwell, MD Taking Active   SYMBICORT 160-4.5 MCG/ACT inhaler [id number] Yes Inhale 2 puffs into the lungs 2 times daily at 12 noon and 4 pm. Rochel Brome, MD Taking Active   traZODone (DESYREL) 50 MG tablet [id number] Yes Take 0.5-1 tablets (25-50 mg total) by mouth at bedtime as needed for sleep. Cox, Kirsten, MD Taking Active   vitamin B-12 1000 MCG tablet [id number] Yes Take 1 tablet (1,000 mcg total) by mouth daily. Raiford [place], DO Taking Active Spouse/Significant Other  vortioxetine HBr (TRINTELLIX) 20 MG TABS tablet [id number] Yes Take 1 tablet (20 mg total) by mouth daily. CoxElnita Maxwell, MD Taking Active           Patient Active Problem List   Diagnosis Date Noted  . BMI 40.0-44.9, adult ([place]) 03/10/2020  . Mild recurrent major depression ([place]) 09/12/2019  . Mixed hyperlipidemia 09/12/2019  . Primary insomnia 09/12/2019  . Nausea 08/07/2019  . Status post total knee replacement 07/25/2019  . Acute encephalopathy 07/19/2019  . Hypokalemia 07/19/2019  . Major depressive disorder   . S/P total knee  replacement 07/15/2019  .  Diarrhea of presumed infectious origin 06/06/2019  . Class 1 obesity due to excess calories with serious comorbidity and body mass index (BMI) of 33.0 to 33.9 in adult 06/02/2019  . Mild intermittent asthma without complication 02/40/9735  . Medicare annual wellness visit, subsequent 05/31/2019  . Primary osteoarthritis of left knee 05/31/2019  . Diabetic polyneuropathy associated with type 2 diabetes mellitus ([place]) 05/31/2019  . Acute non-recurrent maxillary sinusitis 05/08/2019  . 2019 novel coronavirus disease (COVID-19) 12/31/2018  . Benign essential HTN 12/31/2018  . Chronic pain of left knee 12/11/2018  . Incisional hernia 11/03/2016  . Idiopathic progressive neuropathy 04/11/2014    Immunization History  Administered Date(s) Administered  . Fluad Quad(high Dose 65+) 12/19/2019  . Influenza-Unspecified 01/17/2018, 12/14/2018  . PFIZER(Purple Top)SARS-COV-2 Vaccination 05/27/2019, 06/17/2019, 12/19/2019  . Pneumococcal Conjugate-13 04/06/2017  . Tdap 03/04/2019  . Zoster Recombinat (Shingrix) 08/02/2017, 04/04/2018    Conditions to be addressed/monitored:  Hypertension, Hyperlipidemia and Diabetes  Care Plan : [place]  Updates made by Burnice Logan, [place] since 06/24/2020 12:00 AM    Problem: dm, htn, hld   Priority: High  Onset Date: 06/24/2020    Long-Range Goal: Disease Management   Start Date: 06/24/2020  Expected End Date: 06/24/2021  This Visit's Progress: On track  Priority: High  Note:     Current Barriers:  . Unable to independently monitor therapeutic efficacy  Pharmacist Clinical Goal(s):  Marland Kitchen Patient will achieve adherence to monitoring guidelines and medication adherence to achieve therapeutic efficacy . maintain control of diabetes as evidenced by a1c  through collaboration with PharmD and provider.   Interventions: . 1:1 collaboration with Rochel Brome, MD regarding development and update of comprehensive plan of  care as evidenced by provider attestation and co-signature . Inter-disciplinary care team collaboration (see longitudinal plan of care) . Comprehensive medication review performed; medication list updated in electronic medical record  Hypertension (BP goal <130/80) -Controlled -Current treatment: . hydrochlorothiazide 25 mg daily  . Ramipril 2.5 mg daily  -Medications previously tried: none reported  -Current home readings: not checking  -Current dietary habits: eating more fruits and vegetables. Eats meat weekly. Limiting snack and drinking more water.  -Current exercise habits: walking and doing some home exercises each day.  -Denies hypotensive/hypertensive symptoms -Educated on BP goals and benefits of medications for prevention of heart attack, stroke and kidney damage; Daily salt intake goal < 2300 mg; Exercise goal of 150 minutes per week; -Counseled to monitor BP at home as needed, document, and provide log at future appointments -Counseled on diet and exercise extensively Recommended to continue current medication  Hyperlipidemia: (LDL goal < 70) -Not ideally controlled -Current treatment: . atorvastatin 20 mg daily  . Fenofibrate 160 mg daily  -Medications previously tried: none reported  -Current dietary patterns: eating healthier - more fruits and vegetables -Current exercise habits: walking and completing home exercises -Educated on Cholesterol goals;  Benefits of statin for ASCVD risk reduction; Importance of limiting foods high in cholesterol; Exercise goal of 150 minutes per week; -Counseled on diet and exercise extensively Recommended to continue current medication  Diabetes (A1c goal <7%) -Controlled -Current medications: . metformin 500 mg daily in the morning with food . Ozempic 1 mg weekly  . [employer] Delica and Verio testing supplies  -Medications previously tried: victoza  -Current home glucose readings . fasting glucose: <130 mg/dL . post prandial  glucose: not reported -Denies hypoglycemic/hyperglycemic symptoms -Current meal patterns:  . breakfast:  toast . lunch:  vegetables, apples,  eating meat once a week . dinner: vegetables, eating meat once a week, grapes . snacks: limiting snacks . drinks: coffee, water or water with sugar free flavoring added -Current exercise: completing home exercises and walking outdoors -Educated on A1c and blood sugar goals; Exercise goal of 150 minutes per week; Benefits of weight loss; Benefits of routine self-monitoring of blood sugar; Carbohydrate counting and/or plate method Pharamcist determine verio and delica covered under [employer]. Requested scripts sent to [facility] from Dr. Alyse Low nurse.  -Counseled to check feet daily and get yearly eye exams -Counseled on diet and exercise extensively Recommended to continue current medication Collaborated with [employer] and [facility] to determine testing supplies.     Patient Goals/Self-Care Activities . Patient will:  - take medications as prescribed focus on medication adherence by using [facility].  check glucose daily, document, and provide at future appointments target a minimum of 150 minutes of moderate intensity exercise weekly  Follow Up Plan: Telephone follow up appointment with care management team member scheduled for:10/08/2020       Medication Assistance: None required.  Patient affirms current coverage meets needs.  Patient's preferred pharmacy is:  [facility] - [place], [place] - [address] [place] [address] Phone: [phone] Fax: [phone]  [id number] [place] [place] [address]Phone: [phone]: [phone] Uses pill box? Yes Pt endorses good compliance  We discussed: Benefits of medication synchronization, packaging and delivery as well as enhanced pharmacist oversight with [facility]. Patient  decided to: Utilize [facility] pharmacy for medication synchronization, packaging and delivery  Care Plan and Follow Up Patient Decision:  Patient agrees to Care Plan and Follow-up.  Plan: Telephone follow up appointment with care management team member scheduled for:  10/08/2020

## 2020-06-24 NOTE — Progress Notes (Signed)
Chronic Care Management Pharmacy Assistant   Name: Hailey Greene  MRN: [id number] DOB: 1951/03/03   Reason for Encounter: Medication coordination for delivery     Medications: Outpatient Encounter Medications as of 06/24/2020  Medication Sig  . acetaminophen (TYLENOL) 500 MG tablet Take 500-1,000 mg by mouth every 6 (six) hours as needed (for pain). (Patient not taking: Reported on 06/24/2020)  . ASPIRIN 81 PO Take 81 mg by mouth daily.  Marland Kitchen atorvastatin (LIPITOR) 20 MG tablet TAKE ONE (1) TABLET ONCE DAILY  . cyclobenzaprine (FLEXERIL) 10 MG tablet TAKE 1 TABLET 3 TIMES A DAY AS NEEDED (Patient not taking: Reported on 06/24/2020)  . dicyclomine (BENTYL) 20 MG tablet TAKE ONE TABLET EVERY 6 HOURS AS NEEDED  . fenofibrate 160 MG tablet Take 1 tablet (160 mg total) by mouth daily.  . fluticasone (FLONASE) 50 MCG/ACT nasal spray SPRAY 2 SPRAYS INTO EACH NOSTRIL EVERY DAY  . glucose blood (ONETOUCH VERIO) test strip E11.42 Use new test strip each time when checking FBS (Patient not taking: Reported on 06/24/2020)  . hydrochlorothiazide (HYDRODIURIL) 25 MG tablet TAKE ONE (1) TABLET ONCE DAILY  . Lancets (ONETOUCH DELICA PLUS LANCET33G) MISC E 11.42 Use new lancet each time when checking FBS (Patient not taking: Reported on 06/24/2020)  . loratadine (ALLERGY RELIEF) 10 MG tablet TAKE ONE (1) TABLET BY MOUTH ONCE DAILY  . metFORMIN (GLUCOPHAGE) 500 MG tablet TAKE 1 TABLET ONCE DAILY WITH MORNING MEAL  . Multiple Vitamin (MULTIVITAMIN WITH MINERALS) TABS tablet Take 1 tablet by mouth daily.  Marland Kitchen omeprazole (PRILOSEC) 20 MG capsule Take 1 capsule (20 mg total) by mouth daily.  . ondansetron (ZOFRAN) 4 MG tablet TAKE ONE TABLET BY MOUTH EVERY EIGHT HOURS AS NEEDED NAUSEA  . pregabalin (LYRICA) 100 MG capsule Take 1 capsule (100 mg total) by mouth 2 (two) times daily.  . ramipril (ALTACE) 2.5 MG capsule Take 1 capsule (2.5 mg total) by mouth daily.  Marland Kitchen rOPINIRole (REQUIP) 3 MG tablet TAKE ONE  TABLET BY MOUTH ONCE DAILY 1-3 hours BEFORE bedtime  . Semaglutide, 1 MG/DOSE, (OZEMPIC, 1 MG/DOSE,) 2 MG/1.5ML SOPN Inject 1 mg into the skin once a week.  . solifenacin (VESICARE) 5 MG tablet TAKE ONE TABLET BY MOUTH ONCE DAILY  . SYMBICORT 160-4.5 MCG/ACT inhaler Inhale 2 puffs into the lungs 2 times daily at 12 noon and 4 pm.  . traZODone (DESYREL) 50 MG tablet Take 0.5-1 tablets (25-50 mg total) by mouth at bedtime as needed for sleep.  . vitamin B-12 1000 MCG tablet Take 1 tablet (1,000 mcg total) by mouth daily.  Marland Kitchen vortioxetine HBr (TRINTELLIX) 20 MG TABS tablet Take 1 tablet (20 mg total) by mouth daily.   No facility-administered encounter medications on file as of 06/24/2020.    Reviewed chart for medication changes ahead of medication coordination call.  No OVs, Consults, or hospital visits since last care coordination call/Pharmacist visit. (If appropriate, list visit date, provider name)  No medication changes indicated OR if recent visit, treatment plan here.  BP Readings from Last 3 Encounters:  06/24/20 (!) 110/58  03/24/20 130/78  03/10/20 112/60    Lab Results  Component Value Date   HGBA1C 5.9 (H) 03/24/2020     Lucia Gaskins, CPP, notified me that the patient stated she was going to be out of medication soon.  According to [facility] and our [employer] the patient should not need medication until later in April.   I have called and left a  message for the patient to call me, so that I can coordinate what is needed.   Patient obtains medications through [facility]  90 Days   Last adherence delivery included:  Belsomra 10 mg  Ondansetron 4 mg   Patient declined (meds) last month due to PRN use/additional supply on hand. Due to 90ds on 04/02/20 torvastatin 20 mg1 tabletdaily  Dicylclomine 20 mgevery6 hoursprn  Fenofibrate 160 mg1 tabletdaily  Hydrochlorothiazide 25 mg1 tabletdaily  Loratadine 10 mg1 tabletdaily  Omeprazole 20 mg1 tabletdaily   Ondansetron 4 mg q8h prn nausea  One Touch Veriotest stripsdaily  One Touch Delica Lancets daily Ozempic 1 mg/dose (4mg /32mL) Inject in the skin once a week as directed Pregabalin100 mg1 capsule twice daily Ramipril 2.5 mg 1 tablet daily Ropinirole 3 mg1 tabletdaily  Solifenacin (Vesicare)5 mg 1 tabletdaily  Trintellix 20 mg1 tabletdaily  Patient is due for next adherence delivery on: 06/26/20 Called patient and reviewed medications and coordinated delivery.  This delivery to include: Atorvastatin 20 mg1 tabletdaily  Dicylclomine 20 mgevery6 hoursprn  Fenofibrate 160 mg1 tabletdaily  Hydrochlorothiazide 25 mg1 tabletdaily  Loratadine 10 mg1 tabletdaily  Omeprazole 20 mg1 tabletdaily  Ondansetron 4 mg q8h prn nausea   Ozempic 1 mg/dose (4mg /80mL) Inject in the skin once a week as directed Pregabalin100 mg1 capsule twice daily Ramipril 2.5 mg 1 tablet daily Ropinirole 3 mg1 tabletdaily  Solifenacin (Vesicare)5 mg 1 tabletdaily  Trintellix 20 mg1 tabletdaily Preservision Metformin 500mg  Symbicort Vitamin B 12  daily  Patient declined the following medications (meds) due to (reason) Belsomra has been discontinued  Patient needs refills for  Metformin Preservision Pregabalin  Confirmed delivery date of 06/26/20, advised patient that pharmacy will contact them the morning of delivery.  , CMA Clinical Pharmacist Assistant [phone]

## 2020-06-24 NOTE — Patient Instructions (Signed)
Start on trazodone 50 mg once at night. Stop belsomra.

## 2020-06-24 NOTE — Progress Notes (Signed)
  Chronic Care Management   Note  06/24/2020 Name: SANGITA ZANI MRN: [id number] DOB: 1950-07-06  STORMEY WILBORN is a 70 y.o. year old female who is a primary care patient of Cox, Kirsten, MD. I reached out to Clarisa Fling by phone today in response to a referral sent by Ms. Catalina [place] PCP, Cox, Kirsten, MD.   Ms. Brager was given information about Chronic Care Management services today including:  1. CCM service includes personalized support from designated clinical staff supervised by her physician, including individualized plan of care and coordination with other care providers 2. 24/7 contact phone numbers for assistance for urgent and routine care needs. 3. Service will only be billed when office clinical staff spend 20 minutes or more in a month to coordinate care. 4. Only one practitioner may furnish and bill the service in a calendar month. 5. The patient may stop CCM services at any time (effective at the end of the month) by phone call to the office staff.   Patient agreed to services and verbal consent obtained.   Follow up plan:   Carley Perdue [facility] Scheduler

## 2020-06-24 NOTE — Patient Instructions (Addendum)
Visit Information  Goals Addressed            This Visit's Progress   . Learn More About My Health       Timeframe:  Long-Range Goal Priority:  High Start Date:                             Expected End Date:                        Follow Up Date 10/08/2020    - tell my story and reason for my visit - repeat what I heard to make sure I understand - bring a list of my medicines to the visit    Why is this important?    The best way to learn about your health and care is by talking to the doctor and nurse.   They will answer your questions and give you information in the way that you like best.    Notes:     . Lifestyle Change-Hypertension       Timeframe:  Long-Range Goal Priority:  High Start Date:                             Expected End Date:                       Follow Up Date 10/08/2020    - ask questions to understand    Why is this important?    The changes that you are asked to make may be hard to do.   This is especially true when the changes are life-long.   Knowing why it is important to you is the first step.   Working on the change with your family or support person helps you not feel alone.   Reward yourself and family or support person when goals are met. This can be an activity you choose like bowling, hiking, biking, swimming or shooting hoops.     Notes:     Marland Kitchen Manage My Medicine       Timeframe:  Long-Range Goal Priority:  High Start Date:                             Expected End Date:                       Follow Up Date 10/08/2020    - call for medicine refill 2 or 3 days before it runs out - keep a list of all the medicines I take; vitamins and herbals too - use a pillbox to sort medicine    Why is this important?   . These steps will help you keep on track with your medicines.   Notes:       Patient Care Plan: CCM Pharmacy Care Plan    Problem Identified: dm, htn, hld   Priority: High  Onset Date: 06/24/2020     Long-Range Goal: Disease Management   Start Date: 06/24/2020  Expected End Date: 06/24/2021  This Visit's Progress: On track  Priority: High  Note:     Current Barriers:  . Unable to independently monitor therapeutic efficacy  Pharmacist Clinical Goal(s):  Marland Kitchen Patient will achieve adherence to monitoring guidelines and medication adherence to achieve therapeutic efficacy .  maintain control of diabetes as evidenced by a1c  through collaboration with PharmD and provider.   Interventions: . 1:1 collaboration with Rochel Brome, MD regarding development and update of comprehensive plan of care as evidenced by provider attestation and co-signature . Inter-disciplinary care team collaboration (see longitudinal plan of care) . Comprehensive medication review performed; medication list updated in electronic medical record  Hypertension (BP goal <130/80) -Controlled -Current treatment: . hydrochlorothiazide 25 mg daily  . Ramipril 2.5 mg daily  -Medications previously tried: none reported  -Current home readings: not checking  -Current dietary habits: eating more fruits and vegetables. Eats meat weekly. Limiting snack and drinking more water.  -Current exercise habits: walking and doing some home exercises each day.  -Denies hypotensive/hypertensive symptoms -Educated on BP goals and benefits of medications for prevention of heart attack, stroke and kidney damage; Daily salt intake goal < 2300 mg; Exercise goal of 150 minutes per week; -Counseled to monitor BP at home as needed, document, and provide log at future appointments -Counseled on diet and exercise extensively Recommended to continue current medication  Hyperlipidemia: (LDL goal < 70) -Not ideally controlled -Current treatment: . atorvastatin 20 mg daily  . Fenofibrate 160 mg daily  -Medications previously tried: none reported  -Current dietary patterns: eating healthier - more fruits and vegetables -Current exercise habits:  walking and completing home exercises -Educated on Cholesterol goals;  Benefits of statin for ASCVD risk reduction; Importance of limiting foods high in cholesterol; Exercise goal of 150 minutes per week; -Counseled on diet and exercise extensively Recommended to continue current medication  Diabetes (A1c goal <7%) -Controlled -Current medications: . metformin 500 mg daily in the morning with food . Ozempic 1 mg weekly  . [employer] Delica and Verio testing supplies  -Medications previously tried: victoza  -Current home glucose readings . fasting glucose: <130 mg/dL . post prandial glucose: not reported -Denies hypoglycemic/hyperglycemic symptoms -Current meal patterns:  . breakfast:  toast . lunch:  vegetables, apples, eating meat once a week . dinner: vegetables, eating meat once a week, grapes . snacks: limiting snacks . drinks: coffee, water or water with sugar free flavoring added -Current exercise: completing home exercises and walking outdoors -Educated on A1c and blood sugar goals; Exercise goal of 150 minutes per week; Benefits of weight loss; Benefits of routine self-monitoring of blood sugar; Carbohydrate counting and/or plate method Pharamcist determine verio and delica covered under [employer]. Requested scripts sent to [facility] from Dr. Alyse Low nurse.  -Counseled to check feet daily and get yearly eye exams -Counseled on diet and exercise extensively Recommended to continue current medication Collaborated with [employer] and [facility] to determine testing supplies.     Patient Goals/Self-Care Activities . Patient will:  - take medications as prescribed focus on medication adherence by using [facility].  check glucose daily, document, and provide at future appointments target a minimum of 150 minutes of moderate intensity exercise weekly  Follow Up Plan: Telephone follow up appointment with care management team member scheduled for:10/08/2020       The patient verbalized  understanding of instructions, educational materials, and care plan provided today and declined offer to receive copy of patient instructions, educational materials, and care plan.  Telephone follow up appointment with pharmacy team member scheduled for: 09/2020  Burnice Logan, [facility]  Gastroesophageal Reflux Disease, Adult  Gastroesophageal reflux (GER) happens when acid from the stomach flows up into the tube that connects the mouth and the stomach (esophagus). Normally, food travels down the esophagus and stays  in the stomach to be digested. With GER, food and stomach acid sometimes move back up into the esophagus. You may have a disease called gastroesophageal reflux disease (GERD) if the reflux:  Happens often.  Causes frequent or very bad symptoms.  Causes problems such as damage to the esophagus. When this happens, the esophagus becomes sore and swollen. Over time, GERD can make small holes (ulcers) in the lining of the esophagus. What are the causes? This condition is caused by a problem with the muscle between the esophagus and the stomach. When this muscle is weak or not normal, it does not close properly to keep food and acid from coming back up from the stomach. The muscle can be weak because of:  Tobacco use.  Pregnancy.  Having a certain type of hernia (hiatal hernia).  Alcohol use.  Certain foods and drinks, such as coffee, chocolate, onions, and peppermint. What increases the risk?  Being overweight.  Having a disease that affects your connective tissue.  Taking NSAIDs, such a ibuprofen. What are the signs or symptoms?  Heartburn.  Difficult or painful swallowing.  The feeling of having a lump in the throat.  A bitter taste in the mouth.  Bad breath.  Having a lot of saliva.  Having an upset or bloated stomach.  Burping.  Chest pain. Different conditions can cause chest pain. Make sure you see your doctor if you have chest pain.  Shortness of  breath or wheezing.  A long-term cough or a cough at night.  Wearing away of the surface of teeth (tooth enamel).  Weight loss. How is this treated?  Making changes to your diet.  Taking medicine.  Having surgery. Treatment will depend on how bad your symptoms are. Follow these instructions at home: Eating and drinking  Follow a diet as told by your doctor. You may need to avoid foods and drinks such as: ? Coffee and tea, with or without caffeine. ? Drinks that contain alcohol. ? Energy drinks and sports drinks. ? Bubbly (carbonated) drinks or sodas. ? Chocolate and cocoa. ? Peppermint and mint flavorings. ? Garlic and onions. ? Horseradish. ? Spicy and acidic foods. These include peppers, chili powder, curry powder, vinegar, hot sauces, and BBQ sauce. ? Citrus fruit juices and citrus fruits, such as oranges, lemons, and limes. ? Tomato-based foods. These include red sauce, chili, salsa, and pizza with red sauce. ? Fried and fatty foods. These include donuts, french fries, potato chips, and high-fat dressings. ? High-fat meats. These include hot dogs, rib eye steak, sausage, ham, and bacon. ? High-fat dairy items, such as whole milk, butter, and cream cheese.  Eat small meals often. Avoid eating large meals.  Avoid drinking large amounts of liquid with your meals.  Avoid eating meals during the 2-3 hours before bedtime.  Avoid lying down right after you eat.  Do not exercise right after you eat.   Lifestyle  Do not smoke or use any products that contain nicotine or tobacco. If you need help quitting, ask your doctor.  Try to lower your stress. If you need help doing this, ask your doctor.  If you are overweight, lose an amount of weight that is healthy for you. Ask your doctor about a safe weight loss goal.   General instructions  Pay attention to any changes in your symptoms.  Take over-the-counter and prescription medicines only as told by your doctor.  Do  not take aspirin, ibuprofen, or other NSAIDs unless your doctor says it  is okay.  Wear loose clothes. Do not wear anything tight around your waist.  Raise (elevate) the head of your bed about 6 inches (15 cm). You may need to use a wedge to do this.  Avoid bending over if this makes your symptoms worse.  Keep all follow-up visits. Contact a doctor if:  You have new symptoms.  You lose weight and you do not know why.  You have trouble swallowing or it hurts to swallow.  You have wheezing or a cough that keeps happening.  You have a hoarse voice.  Your symptoms do not get better with treatment. Get help right away if:  You have sudden pain in your arms, neck, jaw, teeth, or back.  You suddenly feel sweaty, dizzy, or light-headed.  You have chest pain or shortness of breath.  You vomit and the vomit is green, yellow, or black, or it looks like blood or coffee grounds.  You faint.  Your poop (stool) is red, bloody, or black.  You cannot swallow, drink, or eat. These symptoms may represent a serious problem that is an emergency. Do not wait to see if the symptoms will go away. Get medical help right away. Call your local emergency services (911 in the [place] Do not drive yourself to the hospital. Summary  If a person has gastroesophageal reflux disease (GERD), food and stomach acid move back up into the esophagus and cause symptoms or problems such as damage to the esophagus.  Treatment will depend on how bad your symptoms are.  Follow a diet as told by your doctor.  Take all medicines only as told by your doctor. This information is not intended to replace advice given to you by your health care provider. Make sure you discuss any questions you have with your health care provider. Document Revised: 09/23/2019 Document Reviewed: 09/23/2019 Elsevier Patient Education  [place].

## 2020-06-25 ENCOUNTER — Other Ambulatory Visit: Payer: Self-pay | Admitting: Family Medicine

## 2020-06-25 LAB — LIPID PANEL
Chol/HDL Ratio: 2.7 ratio (ref 0.0–4.4)
Cholesterol, Total: 141 mg/dL (ref 100–199)
HDL: 52 mg/dL (ref >39)
LDL Chol Calc (NIH): 68 mg/dL (ref 0–99)
Triglycerides: 119 mg/dL (ref 0–149)
VLDL Cholesterol Cal: 21 mg/dL (ref 5–40)

## 2020-06-25 LAB — CBC WITH DIFFERENTIAL/PLATELET
Basophils Absolute: 0.1 10*3/uL (ref 0.0–0.2)
Basos: 1 %
EOS (ABSOLUTE): 0.2 10*3/uL (ref 0.0–0.4)
Eos: 2 %
Hematocrit: 39 % (ref 34.0–46.6)
Hemoglobin: 12.8 g/dL (ref 11.1–15.9)
Immature Grans (Abs): 0.1 10*3/uL (ref 0.0–0.1)
Immature Granulocytes: 1 %
Lymphocytes Absolute: 2.3 10*3/uL (ref 0.7–3.1)
Lymphs: 22 %
MCH: 28.7 pg (ref 26.6–33.0)
MCHC: 32.8 g/dL (ref 31.5–35.7)
MCV: 87 fL (ref 79–97)
Monocytes Absolute: 0.6 10*3/uL (ref 0.1–0.9)
Monocytes: 6 %
Neutrophils Absolute: 7.2 10*3/uL — ABNORMAL HIGH (ref 1.4–7.0)
Neutrophils: 68 %
Platelets: 350 10*3/uL (ref 150–450)
RBC: 4.46 x10E6/uL (ref 3.77–5.28)
RDW: 13.5 % (ref 11.7–15.4)
WBC: 10.5 10*3/uL (ref 3.4–10.8)

## 2020-06-25 LAB — COMPREHENSIVE METABOLIC PANEL
ALT: 11 IU/L (ref 0–32)
AST: 15 IU/L (ref 0–40)
Albumin/Globulin Ratio: 2 (ref 1.2–2.2)
Albumin: 4 g/dL (ref 3.8–4.8)
Alkaline Phosphatase: 73 IU/L (ref 44–121)
BUN/Creatinine Ratio: 11 — ABNORMAL LOW (ref 12–28)
BUN: 11 mg/dL (ref 8–27)
Bilirubin Total: 0.3 mg/dL (ref 0.0–1.2)
CO2: 23 mmol/L (ref 20–29)
Calcium: 9.1 mg/dL (ref 8.7–10.3)
Chloride: 98 mmol/L (ref 96–106)
Creatinine, Ser: 1 mg/dL (ref 0.57–1.00)
Globulin, Total: 2 g/dL (ref 1.5–4.5)
Glucose: 86 mg/dL (ref 65–99)
Potassium: 4.4 mmol/L (ref 3.5–5.2)
Sodium: 138 mmol/L (ref 134–144)
Total Protein: 6 g/dL (ref 6.0–8.5)
eGFR: 61 mL/min/{1.73_m2} (ref >59)

## 2020-06-25 LAB — CARDIOVASCULAR RISK ASSESSMENT

## 2020-06-25 LAB — HEMOGLOBIN A1C
Est. average glucose Bld gHb Est-mCnc: 117 mg/dL
Hgb A1c MFr Bld: 5.7 % — ABNORMAL HIGH (ref 4.8–5.6)

## 2020-06-25 LAB — HEPATITIS PANEL, ACUTE
Hep A IgM: NEGATIVE
Hep B C IgM: NEGATIVE
Hep C Virus Ab: <0.1 s/co ratio (ref 0.0–0.9)
Hepatitis B Surface Ag: NEGATIVE

## 2020-06-25 MED ORDER — PHENTERMINE HCL 37.5 MG PO CAPS
37.5000 mg | ORAL_CAPSULE | ORAL | 1 refills | Status: DC
Start: 2020-06-25 — End: 2020-08-13

## 2020-06-25 NOTE — Progress Notes (Signed)
hent

## 2020-06-30 ENCOUNTER — Telehealth: Payer: Self-pay

## 2020-06-30 NOTE — Telephone Encounter (Signed)
Mammogram scheduled at [facility] 07/28/2020 at 11:30. Patient is aware.

## 2020-07-12 ENCOUNTER — Encounter: Payer: Self-pay | Admitting: Family Medicine

## 2020-07-15 ENCOUNTER — Other Ambulatory Visit: Payer: Self-pay | Admitting: Family Medicine

## 2020-07-15 DIAGNOSIS — E1142 Type 2 diabetes mellitus with diabetic polyneuropathy: Secondary | ICD-10-CM

## 2020-08-13 ENCOUNTER — Other Ambulatory Visit: Payer: Self-pay | Admitting: Family Medicine

## 2020-08-26 ENCOUNTER — Other Ambulatory Visit: Payer: Self-pay | Admitting: Family Medicine

## 2020-09-13 ENCOUNTER — Other Ambulatory Visit: Payer: Self-pay | Admitting: Family Medicine

## 2020-09-13 DIAGNOSIS — E782 Mixed hyperlipidemia: Secondary | ICD-10-CM

## 2020-09-13 DIAGNOSIS — I10 Essential (primary) hypertension: Secondary | ICD-10-CM

## 2020-09-13 DIAGNOSIS — F330 Major depressive disorder, recurrent, mild: Secondary | ICD-10-CM

## 2020-09-15 ENCOUNTER — Other Ambulatory Visit: Payer: Self-pay | Admitting: Family Medicine

## 2020-09-15 DIAGNOSIS — E1142 Type 2 diabetes mellitus with diabetic polyneuropathy: Secondary | ICD-10-CM

## 2020-09-16 ENCOUNTER — Telehealth: Payer: Self-pay

## 2020-09-16 ENCOUNTER — Other Ambulatory Visit: Payer: Self-pay

## 2020-09-16 MED ORDER — ONDANSETRON HCL 4 MG PO TABS: ORAL_TABLET | ORAL | 1 refills | Status: DC

## 2020-09-16 NOTE — Progress Notes (Signed)
Chronic Care Management Pharmacy Assistant   Name: NICCI VAUGHAN  MRN: [id number] DOB: [birth year]   Reason for Encounter: Medication coordination for [facility]    Recent office visits:  06/30/20-Mammogram appointment set for 07/28/20   Recent consult visits:  none  Hospital visits:  09/18/20-ED-Orthostatic hypotension, patient was given IV fluids, symptoms were improved, patient was discharged. No other medication changes were noted.   Medications: Outpatient Encounter Medications as of 09/16/2020  Medication Sig   acetaminophen (TYLENOL) 500 MG tablet Take 500-1,000 mg by mouth every 6 (six) hours as needed (for pain). (Patient not taking: Reported on 06/24/2020)   ASPIRIN 81 PO Take 81 mg by mouth daily.   atorvastatin (LIPITOR) 20 MG tablet TAKE ONE TABLET BY MOUTH ONCE DAILY   Blood Glucose Monitoring Suppl (ONETOUCH VERIO) w/Device KIT Use daily to check FBS   cyclobenzaprine (FLEXERIL) 10 MG tablet TAKE 1 TABLET 3 TIMES A DAY AS NEEDED (Patient not taking: Reported on 06/24/2020)   dicyclomine (BENTYL) 20 MG tablet TAKE ONE TABLET BY MOUTH EVERY 6 HOURS AS NEEDED   fenofibrate 160 MG tablet TAKE ONE TABLET BY MOUTH ONCE DAILY   fluticasone (FLONASE) 50 MCG/ACT nasal spray SPRAY 2 SPRAYS INTO EACH NOSTRIL EVERY DAY   glucose blood (ONETOUCH VERIO) test strip E11.42 Use new test strip each time when checking FBS   hydrochlorothiazide (HYDRODIURIL) 25 MG tablet TAKE ONE TABLET BY MOUTH ONCE DAILY   Lancets (ONETOUCH DELICA PLUS PHXTAV69V) MISC E 11.42 Use new lancet each time when checking FBS   loratadine (ALLERGY RELIEF) 10 MG tablet TAKE ONE (1) TABLET BY MOUTH ONCE DAILY   metFORMIN (GLUCOPHAGE) 500 MG tablet TAKE 1 TABLET ONCE DAILY WITH MORNING MEAL   Multiple Vitamin (MULTIVITAMIN WITH MINERALS) TABS tablet Take 1 tablet by mouth daily.   omeprazole (PRILOSEC) 20 MG capsule Take 1 capsule (20 mg total) by mouth daily.   ondansetron (ZOFRAN) 4 MG tablet TAKE ONE TABLET BY  MOUTH EVERY EIGHT HOURS AS NEEDED NAUSEA   OZEMPIC, 1 MG/DOSE, 4 MG/3ML SOPN INJECT 97m into THE SKIN ONCE A WEEK AS DIRECTED   phentermine (ADIPEX-P) 37.5 MG tablet TAKE ONE TABLET BY MOUTH EVERY MORNING   pregabalin (LYRICA) 100 MG capsule TAKE ONE CAPSULE BY MOUTH TWICE DAILY   ramipril (ALTACE) 2.5 MG capsule TAKE ONE CAPSULE BY MOUTH ONCE DAILY   rOPINIRole (REQUIP) 3 MG tablet TAKE ONE TABLET BY MOUTH ONCE DAILY 1-3 hours BEFORE bedtime   solifenacin (VESICARE) 5 MG tablet TAKE ONE TABLET BY MOUTH ONCE DAILY   SYMBICORT 160-4.5 MCG/ACT inhaler Inhale 2 puffs into the lungs 2 times daily at 12 noon and 4 pm.   traZODone (DESYREL) 50 MG tablet Take 0.5-1 tablets (25-50 mg total) by mouth at bedtime as needed for sleep.   TRINTELLIX 20 MG TABS tablet TAKE ONE TABLET BY MOUTH ONCE DAILY   vitamin B-12 1000 MCG tablet Take 1 tablet (1,000 mcg total) by mouth daily.   No facility-administered encounter medications on file as of 09/16/2020.    Reviewed chart for medication changes ahead of medication coordination call.  No OVs, Consults, or hospital visits since last care coordination call/Pharmacist visit. (If appropriate, list visit date, provider name)  No medication changes indicated OR if recent visit, treatment plan here.  BP Readings from Last 3 Encounters:  06/24/20 (!) 110/58  03/24/20 130/78  03/10/20 112/60    Lab Results  Component Value Date   HGBA1C 5.7 (H) 06/24/2020  Patient was unable to be reached prior to [facility] filling her medications.    Patient obtains medications through [facility]  90 Days   Last adherence delivery included:  Atorvastatin 20 mg 1 tablet daily   Dicylclomine 20 mg every 6 hours prn Fenofibrate 160 mg 1 tablet daily   Hydrochlorothiazide 25 mg 1 tablet daily Loratadine 10 mg 1 tablet daily   Omeprazole 20 mg 1 tablet daily Ondansetron 4 mg q8h prn nausea   Ozempic 1 mg/dose (42m/3mL) Inject in the skin once a week as directed Pregabalin  100 mg 1 capsule twice daily Ramipril 2.5 mg 1 tablet daily Ropinirole 3 mg 1 tablet daily   Solifenacin (Vesicare) 5 mg 1 tablet daily Trintellix 20 mg 1 tablet daily   Preservision Metformin 5042mSymbicort Vitamin B 12  100044mdaily   Patient declined (meds) last month due to PRN use/additional supply on hand. Belsomra has been discontinued   Patient is due for next adherence delivery on: 09/25/20. Called patient and reviewed medications and coordinated delivery.  This delivery to include: Atorvastatin 20 mg 1 tablet daily   Dicylclomine 20 mg every 6 hours prn Fenofibrate 160 mg 1 tablet daily   Hydrochlorothiazide 25 mg 1 tablet daily Loratadine 10 mg 1 tablet daily   Omeprazole 20 mg 1 tablet daily Ondansetron 4 mg q8h prn nausea   Ozempic 1 mg/dose (4mg27mL) Inject in the skin once a week as directed Pregabalin 100 mg 1 capsule twice daily Ramipril 2.5 mg 1 tablet daily Ropinirole 3 mg 1 tablet daily   Solifenacin (Vesicare) 5 mg 1 tablet daily Trintellix 20 mg 1 tablet daily   Metformin 500mg40mzadone 50mg 29matient declined the following medications (meds) due to (reason) Preservision-has on hand Symbicort- has on hand Vitamin B 12  1000mcg 76my-gets from other pharmacy  Patient needs refills for  None.  Confirmed delivery date of 09/25/20, advised patient that pharmacy will contact them the morning of delivery.    Tami KiClarita LeberliAlgercist Assistant [phone]

## 2020-09-18 ENCOUNTER — Emergency Department (HOSPITAL_COMMUNITY)
Admission: EM | Admit: 2020-09-18 | Discharge: 2020-09-18 | Disposition: A | Discharge status: Home / Self Care | Payer: 59 | Attending: Emergency Medicine | Admitting: Emergency Medicine

## 2020-09-18 ENCOUNTER — Telehealth: Payer: Self-pay

## 2020-09-18 ENCOUNTER — Encounter (HOSPITAL_COMMUNITY): Payer: Self-pay | Admitting: Emergency Medicine

## 2020-09-18 DIAGNOSIS — I951 Orthostatic hypotension: Secondary | ICD-10-CM | POA: Diagnosis not present

## 2020-09-18 DIAGNOSIS — Z8616 Personal history of COVID-19: Secondary | ICD-10-CM | POA: Insufficient documentation

## 2020-09-18 DIAGNOSIS — Z7982 Long term (current) use of aspirin: Secondary | ICD-10-CM | POA: Diagnosis not present

## 2020-09-18 DIAGNOSIS — I10 Essential (primary) hypertension: Secondary | ICD-10-CM | POA: Diagnosis not present

## 2020-09-18 DIAGNOSIS — Z79899 Other long term (current) drug therapy: Secondary | ICD-10-CM | POA: Insufficient documentation

## 2020-09-18 DIAGNOSIS — Z7984 Long term (current) use of oral hypoglycemic drugs: Secondary | ICD-10-CM | POA: Insufficient documentation

## 2020-09-18 DIAGNOSIS — J4520 Mild intermittent asthma, uncomplicated: Secondary | ICD-10-CM | POA: Insufficient documentation

## 2020-09-18 DIAGNOSIS — E871 Hypo-osmolality and hyponatremia: Secondary | ICD-10-CM | POA: Insufficient documentation

## 2020-09-18 DIAGNOSIS — Z7951 Long term (current) use of inhaled steroids: Secondary | ICD-10-CM | POA: Diagnosis not present

## 2020-09-18 DIAGNOSIS — Z87891 Personal history of nicotine dependence: Secondary | ICD-10-CM | POA: Insufficient documentation

## 2020-09-18 DIAGNOSIS — R42 Dizziness and giddiness: Secondary | ICD-10-CM | POA: Diagnosis present

## 2020-09-18 DIAGNOSIS — E1142 Type 2 diabetes mellitus with diabetic polyneuropathy: Secondary | ICD-10-CM | POA: Diagnosis not present

## 2020-09-18 DIAGNOSIS — Z96653 Presence of artificial knee joint, bilateral: Secondary | ICD-10-CM | POA: Insufficient documentation

## 2020-09-18 LAB — BASIC METABOLIC PANEL
Anion gap: 10 (ref 5–15)
BUN: 11 mg/dL (ref 8–23)
CO2: 26 mmol/L (ref 22–32)
Calcium: 8.9 mg/dL (ref 8.9–10.3)
Chloride: 93 mmol/L — ABNORMAL LOW (ref 98–111)
Creatinine, Ser: 1.12 mg/dL — ABNORMAL HIGH (ref 0.44–1.00)
GFR, Estimated: 53 mL/min — ABNORMAL LOW (ref >60)
Glucose, Bld: 98 mg/dL (ref 70–99)
Potassium: 4.2 mmol/L (ref 3.5–5.1)
Sodium: 129 mmol/L — ABNORMAL LOW (ref 135–145)

## 2020-09-18 LAB — TROPONIN I (HIGH SENSITIVITY)
Troponin I (High Sensitivity): 3 ng/L (ref <18)
Troponin I (High Sensitivity): 3 ng/L (ref <18)

## 2020-09-18 LAB — CBC WITH DIFFERENTIAL/PLATELET
Abs Immature Granulocytes: 0.05 10*3/uL (ref 0.00–0.07)
Basophils Absolute: 0 10*3/uL (ref 0.0–0.1)
Basophils Relative: 0 %
Eosinophils Absolute: 0.1 10*3/uL (ref 0.0–0.5)
Eosinophils Relative: 1 %
HCT: 37.9 % (ref 36.0–46.0)
Hemoglobin: 12.5 g/dL (ref 12.0–15.0)
Immature Granulocytes: 1 %
Lymphocytes Relative: 15 %
Lymphs Abs: 1.5 10*3/uL (ref 0.7–4.0)
MCH: 29.8 pg (ref 26.0–34.0)
MCHC: 33 g/dL (ref 30.0–36.0)
MCV: 90.2 fL (ref 80.0–100.0)
Monocytes Absolute: 0.6 10*3/uL (ref 0.1–1.0)
Monocytes Relative: 6 %
Neutro Abs: 7.7 10*3/uL (ref 1.7–7.7)
Neutrophils Relative %: 77 %
Platelets: 376 10*3/uL (ref 150–400)
RBC: 4.2 MIL/uL (ref 3.87–5.11)
RDW: 13.8 % (ref 11.5–15.5)
WBC: 9.9 10*3/uL (ref 4.0–10.5)
nRBC: 0 % (ref 0.0–0.2)

## 2020-09-18 LAB — URINALYSIS, ROUTINE W REFLEX MICROSCOPIC
Bilirubin Urine: NEGATIVE
Glucose, UA: NEGATIVE mg/dL
Hgb urine dipstick: NEGATIVE
Ketones, ur: NEGATIVE mg/dL
Leukocytes,Ua: NEGATIVE
Nitrite: NEGATIVE
Protein, ur: NEGATIVE mg/dL
Specific Gravity, Urine: 1.018 (ref 1.005–1.030)
pH: 6 (ref 5.0–8.0)

## 2020-09-18 MED ORDER — SODIUM CHLORIDE 0.9 % IV BOLUS
1000.0000 mL | Freq: Once | INTRAVENOUS | Status: AC
  Administered 2020-09-18: 1000 mL via INTRAVENOUS

## 2020-09-18 NOTE — Discharge Instructions (Addendum)
You have been evaluated for your symptoms.  You have been diagnosed with orthostatic hypotension, likely due to dehydration.  Please stay hydrated.  Follow up closely with your doctor for further care.  Return if you have any concerns.

## 2020-09-18 NOTE — ED Notes (Signed)
Pt verbalizes understanding of discharge instructions. Opportunity for questions and answers were provided. Pt discharged from the ED.   ?

## 2020-09-18 NOTE — ED Provider Notes (Signed)
Emergency Medicine Provider Triage Evaluation Note  Hailey Greene , a 70 y.o. female  was evaluated in triage.  Pt complains of frequent falls.    History of osteoarthritis and neuropathy.  On my examination, patient reports that over the course of the past month she has been experiencing frequent falls.  She reports that she will be walking somewhere and then all of a sudden she will get lightheaded as if she is going to pass out.  She states that she usually remembers the fall, but cannot tell me exactly how she fell today.  Denies any associated chest pain, shortness of breath, diaphoresis, nausea.  She just feels weak and lightheaded prior to the fall.  Review of Systems  Positive: Falls, lightheadedness, near syncope/syncope Negative: Chest pain, SOB  Physical Exam  BP 116/84   Pulse 88   Temp 98.6 F (37 C) (Oral)   Resp 18   SpO2 98%  Gen:   Awake, no distress   Resp:  Normal effort  MSK:   Moves extremities without difficulty  Other:    Medical Decision Making  Medically screening exam initiated at 1:51 PM.  Appropriate orders placed.  AMARILYS LYLES was informed that the remainder of the evaluation will be completed by another provider, this initial triage assessment does not replace that evaluation, and the importance of remaining in the ED until their evaluation is complete.     Lorelee New, PA-C 09/18/20 1351    Tilden Fossa, MD 09/18/20 1432

## 2020-09-18 NOTE — Telephone Encounter (Signed)
Pt calling due to having "episodes" would like to move up appointment. Pt states last Tuesday she had an "episode" where she passed out for 30-40 seconds, husband caught pt and she slid out of his arms. Pt states she has been feeling dizzy everyday w/ some nasuea. Denies palpitations, chest pain/tightness, headaches, vision changes. She did fall today w/o injury. W/ pt history advised pt to proceed to ED. Pt states she did not have transportation dn denied EMS. Pt states husband will take her when he gets home.   Lorita Officer, [place] 09/18/20 12:05 PM
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
## 2020-09-18 NOTE — ED Triage Notes (Signed)
Pt here from home with c/o  dizziness and fall after her knees gave out , no cp

## 2020-09-25 ENCOUNTER — Other Ambulatory Visit: Payer: Self-pay

## 2020-09-25 ENCOUNTER — Ambulatory Visit (INDEPENDENT_AMBULATORY_CARE_PROVIDER_SITE_OTHER): Payer: Medicare Other | Admitting: Family Medicine

## 2020-09-25 ENCOUNTER — Encounter: Payer: Self-pay | Admitting: Family Medicine

## 2020-09-25 VITALS — BP 118/62 | HR 60 | Temp 97.8°F | Ht 63.0 in | Wt 206.0 lb

## 2020-09-25 DIAGNOSIS — F330 Major depressive disorder, recurrent, mild: Secondary | ICD-10-CM

## 2020-09-25 DIAGNOSIS — E782 Mixed hyperlipidemia: Secondary | ICD-10-CM | POA: Diagnosis not present

## 2020-09-25 DIAGNOSIS — L2089 Other atopic dermatitis: Secondary | ICD-10-CM | POA: Diagnosis not present

## 2020-09-25 DIAGNOSIS — K219 Gastro-esophageal reflux disease without esophagitis: Secondary | ICD-10-CM | POA: Diagnosis not present

## 2020-09-25 DIAGNOSIS — N3000 Acute cystitis without hematuria: Secondary | ICD-10-CM | POA: Diagnosis not present

## 2020-09-25 DIAGNOSIS — I10 Essential (primary) hypertension: Secondary | ICD-10-CM | POA: Diagnosis not present

## 2020-09-25 DIAGNOSIS — E1142 Type 2 diabetes mellitus with diabetic polyneuropathy: Secondary | ICD-10-CM | POA: Diagnosis not present

## 2020-09-25 DIAGNOSIS — R21 Rash and other nonspecific skin eruption: Secondary | ICD-10-CM | POA: Diagnosis not present

## 2020-09-25 DIAGNOSIS — R55 Syncope and collapse: Secondary | ICD-10-CM | POA: Diagnosis not present

## 2020-09-25 LAB — POCT URINALYSIS DIP (CLINITEK)
Bilirubin, UA: NEGATIVE
Blood, UA: NEGATIVE
Glucose, UA: NEGATIVE mg/dL
Ketones, POC UA: NEGATIVE mg/dL
Nitrite, UA: NEGATIVE
POC PROTEIN,UA: NEGATIVE
Spec Grav, UA: 1.015 (ref 1.010–1.025)
Urobilinogen, UA: 0.2 E.U./dL
pH, UA: 6.5 (ref 5.0–8.0)

## 2020-09-25 LAB — POCT UA - MICROALBUMIN: Microalbumin Ur, POC: 30 mg/L

## 2020-09-25 MED ORDER — NITROFURANTOIN MONOHYD MACRO 100 MG PO CAPS
100.0000 mg | ORAL_CAPSULE | Freq: Two times a day (BID) | ORAL | 0 refills | Status: DC
Start: 2020-09-25 — End: 2020-12-22

## 2020-09-25 MED ORDER — TRIAMCINOLONE ACETONIDE 0.1 % EX CREA: 1.0000 "application " | TOPICAL_CREAM | Freq: Two times a day (BID) | CUTANEOUS | 0 refills | Status: DC

## 2020-09-25 MED ORDER — TRIAMCINOLONE ACETONIDE 40 MG/ML IJ SUSP
80.0000 mg | Freq: Once | INTRAMUSCULAR | Status: AC
  Administered 2020-09-25: 80 mg via INTRAMUSCULAR

## 2020-09-25 NOTE — Progress Notes (Signed)
Subjective:  Patient ID: Hailey Greene, female    DOB: [birth year]  Age: 70 y.o. MRN: [id number]  CC: DM, HTN.  HPI: Diabetic neuropathy. Sugars 109-121, No low sugars. Checks sugars daily, checks feet daily, and annual eye exams. Pt eats a normal diet and has moderate exercise. On metformin 500 mg once daily and lyrica 100 mg one twice a day and amitriptyline 50 mg once at night. .   Hyperlipidemia: On lipitor 20 mg once daily, fenofibrate 160 mg once daily, and aspirin 81 mg once daily.  Hypertension: on hctz and ramipril. Depression: on trintellix 20 mg once daily and trazodone 50 mg once at night.  Obesity: on phentermine. Helps suppress appetite.  Asthma: on symbicort 2 puffs twice a day.  Syncope: passed out last week for less than one minute. Occurs while changing position. EKG normal. Labs normal. Dx: orthostatic hypotension and hyponatremia.     Current Outpatient Medications on File Prior to Visit  Medication Sig Dispense Refill   amitriptyline (ELAVIL) 50 MG tablet Take 50 mg by mouth at bedtime.     ASPIRIN 81 PO Take 81 mg by mouth daily.     atorvastatin (LIPITOR) 20 MG tablet TAKE ONE TABLET BY MOUTH ONCE DAILY 90 tablet 1   Blood Glucose Monitoring Suppl (ONETOUCH VERIO) w/Device KIT Use daily to check FBS 1 kit 0   dicyclomine (BENTYL) 20 MG tablet TAKE ONE TABLET BY MOUTH EVERY 6 HOURS AS NEEDED 360 tablet 1   fenofibrate 160 MG tablet TAKE ONE TABLET BY MOUTH ONCE DAILY 90 tablet 1   fluticasone (FLONASE) 50 MCG/ACT nasal spray SPRAY 2 SPRAYS INTO EACH NOSTRIL EVERY DAY 48 mL 2   glucose blood (ONETOUCH VERIO) test strip E11.42 Use new test strip each time when checking FBS 100 each 2   hydrochlorothiazide (HYDRODIURIL) 25 MG tablet TAKE ONE TABLET BY MOUTH ONCE DAILY 90 tablet 1   Lancets (ONETOUCH DELICA PLUS [id number]) MISC E 11.42 Use new lancet each time when checking FBS 100 each 2   loratadine (ALLERGY RELIEF) 10 MG tablet TAKE ONE (1) TABLET BY MOUTH ONCE  DAILY 90 tablet 3   metFORMIN (GLUCOPHAGE) 500 MG tablet TAKE 1 TABLET ONCE DAILY WITH MORNING MEAL 90 tablet 1   Multiple Vitamin (MULTIVITAMIN WITH MINERALS) TABS tablet Take 1 tablet by mouth daily.     omeprazole (PRILOSEC) 20 MG capsule Take 1 capsule (20 mg total) by mouth daily. 90 capsule 3   ondansetron (ZOFRAN) 4 MG tablet One every 6 hours as needed for nausea and vomiting 90 tablet 1   OZEMPIC, 1 MG/DOSE, 4 MG/3ML SOPN INJECT 39m into THE SKIN ONCE A WEEK AS DIRECTED 9 mL 1   phentermine (ADIPEX-P) 37.5 MG tablet TAKE ONE TABLET BY MOUTH EVERY MORNING 30 tablet 1   pregabalin (LYRICA) 100 MG capsule TAKE ONE CAPSULE BY MOUTH TWICE DAILY 180 capsule 0   ramipril (ALTACE) 2.5 MG capsule TAKE ONE CAPSULE BY MOUTH ONCE DAILY 90 capsule 1   rOPINIRole (REQUIP) 3 MG tablet TAKE ONE TABLET BY MOUTH ONCE DAILY 1-3 hours BEFORE bedtime 90 tablet 1   solifenacin (VESICARE) 5 MG tablet TAKE ONE TABLET BY MOUTH ONCE DAILY 90 tablet 1   SYMBICORT 160-4.5 MCG/ACT inhaler Inhale 2 puffs into the lungs 2 times daily at 12 noon and 4 pm. 1 each 5   traZODone (DESYREL) 50 MG tablet Take 0.5-1 tablets (25-50 mg total) by mouth at bedtime as needed for sleep. 30 tablet  5   TRINTELLIX 20 MG TABS tablet TAKE ONE TABLET BY MOUTH ONCE DAILY 90 tablet 1   vitamin B-12 1000 MCG tablet Take 1 tablet (1,000 mcg total) by mouth daily. 30 tablet 0   No current facility-administered medications on file prior to visit.   Past Medical History:  Diagnosis Date   Acute hypoxemic respiratory failure due to COVID-19 (HCC)    Chicken pox    Depression    Diabetes (HCC)    GERD (gastroesophageal reflux disease)    Hypertension    Idiopathic progressive neuropathy    Major depressive disorder    Mixed hyperlipidemia    Neuropathy    Osteoarthritis    Pneumonia    Primary insomnia    RLS (restless legs syndrome)    Tachycardia    Urge incontinence    UTI (lower urinary tract infection)    Weakness    Past  Surgical History:  Procedure Laterality Date   ABDOMINAL HYSTERECTOMY     APPENDECTOMY     CHOLECYSTECTOMY     HERNIA REPAIR     REPLACEMENT TOTAL KNEE Right    TONSILLECTOMY     TOTAL KNEE ARTHROPLASTY Left 07/15/2019   Procedure: TOTAL KNEE ARTHROPLASTY;  Surgeon: Vickey Huger, MD;  Location: [facility];  Service: Orthopedics;  Laterality: Left;    Family History  Problem Relation Age of Onset   Thyroid disease Mother    Cancer Mother    Migraines Mother    Brain cancer Father    Heart disease Maternal Grandmother    Diabetes Daughter    Social History   Socioeconomic History   Marital status: Married    Spouse name: Not on file   Number of children: 3   Years of education: 12   Highest education level: Not on file  Occupational History   Occupation: Housewife  Tobacco Use   Smoking status: Former    Packs/day: 2.00    Years: 12.00    Pack years: 24.00    Types: Cigarettes    Quit date: 05/25/2010    Years since quitting: 10.3   Smokeless tobacco: Never  Vaping Use   Vaping Use: Never used  Substance and Sexual Activity   Alcohol use: Yes    Comment: occasional   Drug use: No   Sexual activity: Not on file  Other Topics Concern   Not on file  Social History Narrative   Born and raised in [place], [place]. Currently lives in a house with her husband. 1 dog. Fun: Garden, feed birds, swimming   Denies any religious beliefs effecting health care.    Social Determinants of Health   Financial Resource Strain: High Risk   Difficulty of Paying Living Expenses: Hard  Food Insecurity: No Food Insecurity   Worried About Charity fundraiser in the Last Year: Never true   Ran Out of Food in the Last Year: Never true  Transportation Needs: No Transportation Needs   Lack of Transportation (Medical): No   Lack of Transportation (Non-Medical): No  Physical Activity: Insufficiently Active   Days of Exercise per Week: 7 days   Minutes of Exercise per Session: 20 min  Stress: Not  on file  Social Connections: Not on file    Review of Systems  Constitutional:  Negative for appetite change, fatigue and fever.  HENT:  Negative for congestion, ear pain, sinus pressure and sore throat.   Eyes:  Negative for pain.  Respiratory:  Negative for cough, chest tightness,  shortness of breath and wheezing.   Cardiovascular:  Negative for chest pain and palpitations.  Gastrointestinal:  Negative for abdominal pain, constipation, diarrhea, nausea and vomiting.  Genitourinary:  Negative for dysuria and hematuria.  Musculoskeletal:  Negative for arthralgias, back pain, joint swelling and myalgias.  Skin:  Negative for rash.  Neurological:  Negative for dizziness, weakness and headaches.  Psychiatric/Behavioral:  Negative for dysphoric mood. The patient is not nervous/anxious.     Objective:  BP 118/62 (BP Location: Left Arm, Patient Position: Sitting)   Pulse 60   Temp 97.8 F (36.6 C) (Temporal)   Ht '5\' 3"'  (1.6 m)   Wt 206 lb (93.4 kg)   SpO2 96%   BMI 36.49 kg/m   BP/Weight 09/25/2020 09/18/2020 0/12/2723  Systolic BP 366 440 347  Diastolic BP 62 62 58  Wt. (Lbs) 206 - 221  BMI 36.49 - 39.15    Physical Exam Vitals reviewed.  Constitutional:      Appearance: Normal appearance. She is obese.  HENT:     Right Ear: Tympanic membrane, ear canal and external ear normal.     Left Ear: Tympanic membrane, ear canal and external ear normal.     Nose: Nose normal.     Mouth/Throat:     Mouth: Mucous membranes are moist.  Neck:     Vascular: No carotid bruit.  Cardiovascular:     Rate and Rhythm: Normal rate and regular rhythm.     Pulses: Normal pulses.     Heart sounds: Normal heart sounds.  Pulmonary:     Effort: Pulmonary effort is normal.     Breath sounds: Normal breath sounds.  Abdominal:     Palpations: Abdomen is soft.  Musculoskeletal:        General: Normal range of motion.  Skin:    General: Skin is warm and dry.     Findings: Rash (excoriations on  chest. rash on nose: erythematous lesion.) present.  Neurological:     Mental Status: She is alert and oriented to person, place, and time.  Psychiatric:        Mood and Affect: Mood normal.        Behavior: Behavior normal.        Thought Content: Thought content normal.        Judgment: Judgment normal.    Diabetic Foot Exam - Simple   Simple Foot Form  09/25/2020  1:04 AM  Visual Inspection No deformities, no ulcerations, no other skin breakdown bilaterally: Yes Sensation Testing See comments: Yes Pulse Check Posterior Tibialis and Dorsalis pulse intact bilaterally: Yes Comments Decreased sensation BL feet.       Lab Results  Component Value Date   WBC 9.4 09/25/2020   HGB 13.5 09/25/2020   HCT 41.6 09/25/2020   PLT 408 09/25/2020   GLUCOSE 109 (H) 09/25/2020   CHOL 155 09/25/2020   TRIG 115 09/25/2020   HDL 58 09/25/2020   LDLCALC 77 09/25/2020   ALT 12 09/25/2020   AST 12 09/25/2020   NA 133 (L) 09/25/2020   K 4.9 09/25/2020   CL 95 (L) 09/25/2020   CREATININE 0.86 09/25/2020   BUN 11 09/25/2020   CO2 23 09/25/2020   TSH 0.885 07/19/2019   HGBA1C 5.7 (H) 09/25/2020   MICROALBUR 30 09/25/2020      Assessment & Plan:   1. Diabetic polyneuropathy associated with type 2 diabetes mellitus (HCC) Control: at goal Recommend check sugars fasting daily. Recommend check feet daily. Recommend annual  eye exams. Medicines: continue current medicines.  Continue to work on eating a healthy diet and exercise.  Labs drawn today.   - Hemoglobin A1c - POCT UA - Microalbumin  2. Essential hypertension Discontinue hctz.  - CBC with Differential/Platelet - Comprehensive metabolic panel - POCT URINALYSIS DIP (CLINITEK)  3. Mixed hyperlipidemia Well controlled.  No changes to medicines.  Continue to work on eating a healthy diet and exercise.  Labs drawn today.  - Lipid panel  4. Mild recurrent major depression (HCC) The current medical regimen is effective;   continue present plan and medications.  5. Gastroesophageal reflux disease without esophagitis The current medical regimen is effective;  continue present plan and medications.  6. Syncope, unspecified syncope type Needs holtor monitor and perhaps an echo. - Ambulatory referral to Cardiology  7. Orthostatic hypertension Keep drinking plenty of water.  Get up slowly.  Stop hctz.  - Ambulatory referral to Cardiology  8. Other atopic dermatitis Triamcinalone cream.  - triamcinolone acetonide (KENALOG-40) injection 80 mg  9. Rash Mix triamcinalone and neosporin to nose.   10. Acute cystitis without hematuria Rx: macrobid.  - Urine Culture   Meds ordered this encounter  Medications   triamcinolone acetonide (KENALOG-40) injection 80 mg   triamcinolone cream (KENALOG) 0.1 %    Sig: Apply 1 application topically 2 (two) times daily.    Dispense:  80 g    Refill:  0   nitrofurantoin, macrocrystal-monohydrate, (MACROBID) 100 MG capsule    Sig: Take 1 capsule (100 mg total) by mouth 2 (two) times daily.    Dispense:  10 capsule    Refill:  0     Orders Placed This Encounter  Procedures   Urine Culture   CBC with Differential/Platelet   Comprehensive metabolic panel   Hemoglobin A1c   Lipid panel   Cardiovascular Risk Assessment   Ambulatory referral to Cardiology   POCT UA - Microalbumin   POCT URINALYSIS DIP (CLINITEK)      Follow-up: Return in about 3 months (around 12/26/2020) for fasting.  An After Visit Summary was printed and given to the patient.   I, Fonnie Mu as a scribe for Rochel Brome, MD.,have documented all relevant documentation on the behalf of Rochel Brome, MD,as directed by  Rochel Brome, MD while in the presence of Rochel Brome, MD.    Rochel Brome, MD [place] [phone]

## 2020-09-25 NOTE — Patient Instructions (Addendum)
Discontinue hydrochlorothiazide.  Apply triamcinalone cream to rash on chest.  Combine neosporin and triamcinalone and apply to nose 3 times a day.  Keep drinking plenty of water  Get up slowly

## 2020-09-26 LAB — CBC WITH DIFFERENTIAL/PLATELET
Basophils Absolute: 0 10*3/uL (ref 0.0–0.2)
Basos: 0 %
EOS (ABSOLUTE): 0.1 10*3/uL (ref 0.0–0.4)
Eos: 1 %
Hematocrit: 41.6 % (ref 34.0–46.6)
Hemoglobin: 13.5 g/dL (ref 11.1–15.9)
Immature Grans (Abs): 0.1 10*3/uL (ref 0.0–0.1)
Immature Granulocytes: 1 %
Lymphocytes Absolute: 1.7 10*3/uL (ref 0.7–3.1)
Lymphs: 18 %
MCH: 29.2 pg (ref 26.6–33.0)
MCHC: 32.5 g/dL (ref 31.5–35.7)
MCV: 90 fL (ref 79–97)
Monocytes Absolute: 0.5 10*3/uL (ref 0.1–0.9)
Monocytes: 5 %
Neutrophils Absolute: 7.1 10*3/uL — ABNORMAL HIGH (ref 1.4–7.0)
Neutrophils: 75 %
Platelets: 408 10*3/uL (ref 150–450)
RBC: 4.63 x10E6/uL (ref 3.77–5.28)
RDW: 12.8 % (ref 11.7–15.4)
WBC: 9.4 10*3/uL (ref 3.4–10.8)

## 2020-09-26 LAB — COMPREHENSIVE METABOLIC PANEL
ALT: 12 IU/L (ref 0–32)
AST: 12 IU/L (ref 0–40)
Albumin/Globulin Ratio: 1.7 (ref 1.2–2.2)
Albumin: 4 g/dL (ref 3.8–4.8)
Alkaline Phosphatase: 80 IU/L (ref 44–121)
BUN/Creatinine Ratio: 13 (ref 12–28)
BUN: 11 mg/dL (ref 8–27)
Bilirubin Total: 0.2 mg/dL (ref 0.0–1.2)
CO2: 23 mmol/L (ref 20–29)
Calcium: 9.6 mg/dL (ref 8.7–10.3)
Chloride: 95 mmol/L — ABNORMAL LOW (ref 96–106)
Creatinine, Ser: 0.86 mg/dL (ref 0.57–1.00)
Globulin, Total: 2.3 g/dL (ref 1.5–4.5)
Glucose: 109 mg/dL — ABNORMAL HIGH (ref 65–99)
Potassium: 4.9 mmol/L (ref 3.5–5.2)
Sodium: 133 mmol/L — ABNORMAL LOW (ref 134–144)
Total Protein: 6.3 g/dL (ref 6.0–8.5)
eGFR: 73 mL/min/{1.73_m2} (ref >59)

## 2020-09-26 LAB — HEMOGLOBIN A1C
Est. average glucose Bld gHb Est-mCnc: 117 mg/dL
Hgb A1c MFr Bld: 5.7 % — ABNORMAL HIGH (ref 4.8–5.6)

## 2020-09-26 LAB — LIPID PANEL
Chol/HDL Ratio: 2.7 ratio (ref 0.0–4.4)
Cholesterol, Total: 155 mg/dL (ref 100–199)
HDL: 58 mg/dL (ref >39)
LDL Chol Calc (NIH): 77 mg/dL (ref 0–99)
Triglycerides: 115 mg/dL (ref 0–149)
VLDL Cholesterol Cal: 20 mg/dL (ref 5–40)

## 2020-09-26 LAB — CARDIOVASCULAR RISK ASSESSMENT

## 2020-09-27 LAB — URINE CULTURE

## 2020-10-04 ENCOUNTER — Other Ambulatory Visit: Payer: Self-pay | Admitting: Family Medicine

## 2020-10-04 DIAGNOSIS — F330 Major depressive disorder, recurrent, mild: Secondary | ICD-10-CM

## 2020-10-08 ENCOUNTER — Telehealth: Payer: 59

## 2020-10-19 ENCOUNTER — Ambulatory Visit (INDEPENDENT_AMBULATORY_CARE_PROVIDER_SITE_OTHER): Payer: Medicare Other

## 2020-10-19 ENCOUNTER — Other Ambulatory Visit: Payer: Self-pay

## 2020-10-19 ENCOUNTER — Ambulatory Visit
Admission: RE | Admit: 2020-10-19 | Discharge: 2020-10-19 | Disposition: A | Discharge status: Home / Self Care | Payer: Medicare Other | Source: Ambulatory Visit | Attending: Family Medicine | Admitting: Family Medicine

## 2020-10-19 DIAGNOSIS — Z1231 Encounter for screening mammogram for malignant neoplasm of breast: Secondary | ICD-10-CM | POA: Diagnosis not present

## 2020-10-19 DIAGNOSIS — I10 Essential (primary) hypertension: Secondary | ICD-10-CM | POA: Diagnosis not present

## 2020-10-19 DIAGNOSIS — E1142 Type 2 diabetes mellitus with diabetic polyneuropathy: Secondary | ICD-10-CM | POA: Diagnosis not present

## 2020-10-19 NOTE — Progress Notes (Signed)
Chronic Care Management Pharmacy Note  10/24/2020 Name:  Hailey Greene MRN:  [id number] DOB:  1950/10/23   Plan Update:  BP - checking at home. Still having some dizziness and using caution when standing. 124/60. Drinking plenty of water. Eating healthy.  BS - 116 mg/dL. Doing well.  Watching diet. Weight is 197 at home this week.  Working to reduce dose of amitriptyline due to history of falls. Patient will follow-up with pharmacist in 1 week to assess. .   Subjective: Hailey Greene is an 70 y.o. year old female who is a primary patient of Cox, Kirsten, MD.  The CCM team was consulted for assistance with disease management and care coordination needs.    Engaged with patient face to face for follow up visit in response to provider referral for pharmacy case management and/or care coordination services.   Consent to Services:  The patient was given information about Chronic Care Management services, agreed to services, and gave verbal consent prior to initiation of services.  Please see initial visit note for detailed documentation.   Patient Care Team: Rochel Brome, MD as PCP - General (Family Medicine) Vickey Huger, MD as Consulting Physician (Orthopedic Surgery)  Recent office visits: 09/25/2020 - stop hctz. Referral to cardiology for monitor and echo. Encouraged hydration and standing slowly. Acute cystitis without hematuria - prescribed macrobid.   Recent consult visits: None reported  Hospital visits:  09/18/2020 - ED for orthostatic hypotension - fluids given and encouraged ongoing hydration at home.   Objective:  Lab Results  Component Value Date   CREATININE 0.86 09/25/2020   BUN 11 09/25/2020   GFRNONAA 53 (L) 09/18/2020   GFRAA 77 03/24/2020   NA 133 (L) 09/25/2020   K 4.9 09/25/2020   CALCIUM 9.6 09/25/2020   CO2 23 09/25/2020   GLUCOSE 109 (H) 09/25/2020    Lab Results  Component Value Date/Time   HGBA1C 5.7 (H) 09/25/2020 10:33 AM   HGBA1C 5.7  (H) 06/24/2020 11:13 AM   MICROALBUR 30 09/25/2020 10:36 AM   MICROALBUR 30 05/31/2019 11:19 AM    Last diabetic Eye exam: No results found for: HMDIABEYEEXA  Last diabetic Foot exam: No results found for: HMDIABFOOTEX   Lab Results  Component Value Date   CHOL 155 09/25/2020   HDL 58 09/25/2020   LDLCALC 77 09/25/2020   TRIG 115 09/25/2020   CHOLHDL 2.7 09/25/2020    Hepatic Function Latest Ref Rng & Units 09/25/2020 06/24/2020 03/24/2020  Total Protein 6.0 - 8.5 g/dL 6.3 6.0 6.4  Albumin 3.8 - 4.8 g/dL 4.0 4.0 4.0  AST 0 - 40 IU/L _0 ALT 0 - 32 IU/L _1 Alk Phosphatase 44 - 121 IU/L 80 73 92  Total Bilirubin 0.0 - 1.2 mg/dL 0.2 0.3 0.3    Lab Results  Component Value Date/Time   TSH 0.885 07/19/2019 10:00 PM   TSH 1.520 05/31/2019 11:20 AM   TSH 3.984 01/07/2014 08:45 AM    CBC Latest Ref Rng & Units 09/25/2020 09/18/2020 06/24/2020  WBC 3.4 - 10.8 x10E3/uL 9.4 9.9 10.5  Hemoglobin 11.1 - 15.9 g/dL 13.5 12.5 12.8  Hematocrit 34.0 - 46.6 % 41.6 37.9 39.0  Platelets 150 - 450 x10E3/uL 408 376 350    No results found for: VD25OH  Clinical ASCVD: No  The 10-year ASCVD risk score Mikey Bussing DC Jr., et al., 2013) is: 16.3%   Values used to calculate the score:     Age:  70 years     Sex: Female     Is Non-Hispanic African American: No     Diabetic: Yes     Tobacco smoker: No     Systolic Blood Pressure: 423 mmHg     Is BP treated: Yes     HDL Cholesterol: 58 mg/dL     Total Cholesterol: 155 mg/dL    Depression screen [facility] 2/9 06/24/2020 03/10/2020 12/22/2019  Decreased Interest _0 Down, Depressed, Hopeless 2 3 -  PHQ - 2 Score _1 Altered sleeping 2 0 3  Tired, decreased energy _2 Change in appetite 0 3 3  Feeling bad or failure about yourself  _3 Trouble concentrating _4 Moving slowly or fidgety/restless 2 0 3  Suicidal thoughts 0 0 0  PHQ-9 Score _5 Difficult doing work/chores Somewhat difficult - Extremely dIfficult     Social  History   Tobacco Use  Smoking Status Former   Packs/day: 2.00   Years: 12.00   Pack years: 24.00   Types: Cigarettes   Quit date: 05/25/2010   Years since quitting: 10.4  Smokeless Tobacco Never   BP Readings from Last 3 Encounters:  09/25/20 118/62  09/18/20 136/62  06/24/20 (!) 110/58   Pulse Readings from Last 3 Encounters:  09/25/20 60  09/18/20 80  06/24/20 88   Wt Readings from Last 3 Encounters:  09/25/20 206 lb (93.4 kg)  06/24/20 221 lb (100.2 kg)  03/24/20 226 lb (102.5 kg)   BMI Readings from Last 3 Encounters:  09/25/20 36.49 kg/m  06/24/20 39.15 kg/m  03/24/20 40.03 kg/m    Assessment/Interventions: Review of patient past medical history, allergies, medications, health status, including review of consultants reports, laboratory and other test data, was performed as part of comprehensive evaluation and provision of chronic care management services.   SDOH:  (Social Determinants of Health) assessments and interventions performed: Yes  SDOH Screenings   Alcohol Screen: Not on file  Depression (PHQ2-9): Medium Risk   PHQ-2 Score: 13  Financial Resource Strain: High Risk   Difficulty of Paying Living Expenses: Hard  Food Insecurity: No Food Insecurity   Worried About Charity fundraiser in the Last Year: Never true   Ran Out of Food in the Last Year: Never true  Housing: Low Risk    Last Housing Risk Score: 0  Physical Activity: Insufficiently Active   Days of Exercise per Week: 7 days   Minutes of Exercise per Session: 20 min  Social Connections: Not on file  Stress: Not on file  Tobacco Use: Medium Risk   Smoking Tobacco Use: Former   Smokeless Tobacco Use: Never  Transportation Needs: No Data processing manager (Medical): No   Lack of Transportation (Non-Medical): No    CCM Care Plan  No Known Allergies  Medications Reviewed Today     Reviewed by Burnice Logan, [facility] (Pharmacist) on 10/19/20 at [phone]  Med List  Status: <None>   Medication Order Taking? Sig Documenting Provider Last Dose Status Informant  amitriptyline (ELAVIL) 50 MG tablet [id number] Yes TAKE ONE TABLET BY MOUTH EVERYDAY AT BEDTIME  Patient taking differently: Take 50 mg by mouth at bedtime as needed.   Rochel Brome, MD Taking Active   ASPIRIN 81 PO [id number] Yes Take 81 mg by mouth daily. [provider] Taking Active   atorvastatin (LIPITOR) 20 MG tablet [id number] Yes TAKE ONE  TABLET BY MOUTH ONCE DAILY Marge Duncans, PA-C Taking Active   Blood Glucose Monitoring Suppl [facility] VERIO) w/Device Drucie Opitz [id number] Yes Use daily to check FBS Cox, Kirsten, MD Taking Active   dicyclomine (BENTYL) 20 MG tablet [id number] Yes TAKE ONE TABLET BY MOUTH EVERY 6 HOURS AS NEEDED Marge Duncans, PA-C Taking Active   fenofibrate 160 MG tablet [id number] Yes TAKE ONE TABLET BY MOUTH ONCE DAILY Marge Duncans, PA-C Taking Active   fluticasone (FLONASE) 50 MCG/ACT nasal spray [id number] Yes SPRAY 2 SPRAYS INTO EACH NOSTRIL EVERY DAY Cox, Kirsten, MD Taking Active   glucose blood (ONETOUCH VERIO) test strip [id number] Yes E11.42 Use new test strip each time when checking FBS Cox, Kirsten, MD Taking Active   hydrochlorothiazide (HYDRODIURIL) 25 MG tablet [id number] No TAKE ONE TABLET BY MOUTH ONCE DAILY  Patient not taking: Reported on 10/19/2020   Marge Duncans, PA-C Not Taking Consider Medication Status and Discontinue   Lancets [facility] DELICA PLUS [id number]) [place] [id number] Yes E 11.42 Use new lancet each time when checking FBS Cox, Kirsten, MD Taking Active   loratadine (ALLERGY RELIEF) 10 MG tablet [id number] Yes TAKE ONE (1) TABLET BY MOUTH ONCE DAILY  Patient taking differently: Take 10 mg by mouth daily as needed. TAKE ONE (1) TABLET BY MOUTH ONCE DAILY as needed   Cox, Kirsten, MD Taking Active   metFORMIN (GLUCOPHAGE) 500 MG tablet [id number] Yes TAKE 1 TABLET ONCE DAILY WITH MORNING MEAL Cox, Kirsten, MD Taking Active   Multiple Vitamin (MULTIVITAMIN  WITH MINERALS) TABS tablet [id number] Yes Take 1 tablet by mouth daily. [provider] Taking Active Spouse/Significant Other  nitrofurantoin, macrocrystal-monohydrate, (MACROBID) 100 MG capsule [id number] No Take 1 capsule (100 mg total) by mouth 2 (two) times daily.  Patient not taking: Reported on 10/19/2020   CoxElnita Maxwell, MD Not Taking Consider Medication Status and Discontinue   omeprazole (PRILOSEC) 20 MG capsule [id number] No Take 1 capsule (20 mg total) by mouth daily.  Patient not taking: Reported on 10/19/2020   Rochel Brome, MD Not Taking Active   ondansetron [facility]) 4 MG tablet [id number] Yes One every 6 hours as needed for nausea and vomiting Lillard Anes, MD Taking Active   OZEMPIC, 1 MG/DOSE, 4 MG/3ML [facility] [id number] Yes INJECT 82m into THE SKIN ONCE A WEEK AS DIRECTED Cox, Kirsten, MD Taking Active   phentermine (ADIPEX-P) 37.5 MG tablet [id number] TAKE ONE TABLET BY MOUTH EVERY MORNING Cox, Kirsten, MD Taking Active   pregabalin (LYRICA) 100 MG capsule [id number] TAKE ONE CAPSULE BY MOUTH TWICE DAILY DMarge Duncans PA-C Taking Active   ramipril (ALTACE) 2.5 MG capsule [id number] TAKE ONE CAPSULE BY MOUTH ONCE DAILY DMarge Duncans PA-C Taking Active   rOPINIRole (REQUIP) 3 MG tablet [id number] TAKE ONE TABLET BY MOUTH ONCE DAILY 1-3 hours BEFORE bedtime Cox, Kirsten, MD Taking Active   solifenacin (VESICARE) 5 MG tablet [id number] TAKE ONE TABLET BY MOUTH ONCE DAILY Cox,Elnita Maxwell MD Taking Active   SYMBICORT 160-4.5 MCG/ACT inhaler [id number] Inhale 2 puffs into the lungs 2 times daily at 12 noon and 4 pm. CRochel Brome MD Taking Active   traZODone (DESYREL) 50 MG tablet [id number] Take 0.5-1 tablets (25-50 mg total) by mouth at bedtime as needed for sleep.  Patient not taking: Reported on 10/19/2020   CRochel Brome MD Not Taking Active   triamcinolone cream (KENALOG) 0.1 % [id number] Apply 1 application topically 2 (two) times daily.  Patient  taking differently: Apply 1  application topically 2 (two) times daily as needed.   Rochel Brome, MD Taking Active   TRINTELLIX 20 MG TABS tablet [id number] Yes TAKE ONE TABLET BY MOUTH ONCE DAILY Marge Duncans, PA-C Taking Active   vitamin B-12 1000 MCG tablet [id number] Yes Take 1 tablet (1,000 mcg total) by mouth daily. Raiford Noble [place]ett, DO Taking Active Spouse/Significant Other            Patient Active Problem List   Diagnosis Date Noted   BMI 40.0-44.9, adult ([place]) 03/10/2020   Mild recurrent major depression ([place]) 09/12/2019   Mixed hyperlipidemia 09/12/2019   Primary insomnia 09/12/2019   Nausea 08/07/2019   Status post total knee replacement 07/25/2019   Acute encephalopathy 07/19/2019   Hypokalemia 07/19/2019   Major depressive disorder    S/P total knee replacement 07/15/2019   Diarrhea of presumed infectious origin 06/06/2019   Class 1 obesity due to excess calories with serious comorbidity and body mass index (BMI) of 33.0 to 33.9 in adult 06/02/2019   Mild intermittent asthma without complication 01/77/9390   Medicare annual wellness visit, subsequent 05/31/2019   Primary osteoarthritis of left knee 05/31/2019   Diabetic polyneuropathy associated with type 2 diabetes mellitus ([place]) 05/31/2019   Acute non-recurrent maxillary sinusitis 05/08/2019   2019 novel coronavirus disease (COVID-19) 12/31/2018   Benign essential HTN 12/31/2018   Chronic pain of left knee 12/11/2018   Incisional hernia 11/03/2016   Idiopathic progressive neuropathy 04/11/2014    Immunization History  Administered Date(s) Administered   Fluad Quad(high Dose 65+) 12/19/2019   Influenza-Unspecified 01/17/2018, 12/14/2018   PFIZER(Purple Top)SARS-COV-2 Vaccination 05/27/2019, 06/17/2019, 12/19/2019   Pneumococcal Conjugate-13 04/06/2017   Tdap 03/04/2019   Zoster Recombinat (Shingrix) 08/02/2017, 04/04/2018    Conditions to be addressed/monitored:  Hypertension, Hyperlipidemia and  Diabetes  Care Plan : [place]  Updates made by Burnice Logan, [place] since 10/24/2020 12:00 AM     Problem: dm, htn, hld   Priority: High  Onset Date: 06/24/2020     Long-Range Goal: Disease Management   Start Date: 06/24/2020  Expected End Date: 06/24/2021  Recent Progress: On track  Priority: High  Note:    Current Barriers:  Unable to independently monitor therapeutic efficacy  Pharmacist Clinical Goal(s):  Patient will achieve adherence to monitoring guidelines and medication adherence to achieve therapeutic efficacy maintain control of diabetes as evidenced by a1c  through collaboration with PharmD and provider.   Interventions: 1:1 collaboration with Cox, Kirsten, MD regarding development and update of comprehensive plan of care as evidenced by provider attestation and co-signature Inter-disciplinary care team collaboration (see longitudinal plan of care) Comprehensive medication review performed; medication list updated in electronic medical record  Hypertension (BP goal <130/80) -Controlled -Current treatment: hydrochlorothiazide 25 mg daily  Ramipril 2.5 mg daily  -Medications previously tried: none reported  -Current home readings: not checking  -Current dietary habits: eating more fruits and vegetables. Eats meat weekly. Limiting snack and drinking more water.  -Current exercise habits: walking and doing some home exercises each day.  -Denies hypotensive/hypertensive symptoms -Educated on BP goals and benefits of medications for prevention of heart attack, stroke and kidney damage; Daily salt intake goal < 2300 mg; Exercise goal of 150 minutes per week; -Counseled to monitor BP at home as needed, document, and provide log at future appointments -Counseled on diet and exercise extensively Recommended to continue current medication  Hyperlipidemia: (LDL goal < 70) -Not ideally controlled -Current treatment: atorvastatin 20 mg daily  Fenofibrate 160  mg daily  -Medications previously tried: none reported  -Current dietary patterns: eating healthier - more fruits and vegetables -Current exercise habits: walking and completing home exercises -Educated on Cholesterol goals;  Benefits of statin for ASCVD risk reduction; Importance of limiting foods high in cholesterol; Exercise goal of 150 minutes per week; -Counseled on diet and exercise extensively Recommended to continue current medication  Diabetes (A1c goal <7%) -Controlled -Current medications: metformin 500 mg daily in the morning with food Ozempic 1 mg weekly  [employer] Delica and Verio testing supplies  -Medications previously tried: victoza  -Current home glucose readings fasting glucose: <130 mg/dL post prandial glucose: not reported -Denies hypoglycemic/hyperglycemic symptoms -Current meal patterns:  breakfast:  toast lunch:  vegetables, apples, eating meat once a week dinner: vegetables, eating meat once a week, grapes snacks: limiting snacks drinks: coffee, water or water with sugar free flavoring added -Current exercise: completing home exercises and walking outdoors -Educated on A1c and blood sugar goals; Exercise goal of 150 minutes per week; Benefits of weight loss; Benefits of routine self-monitoring of blood sugar; Carbohydrate counting and/or plate method -Counseled to check feet daily and get yearly eye exams -Counseled on diet and exercise extensively Recommended to continue current medication   Patient Goals/Self-Care Activities Patient will:  - take medications as prescribed focus on medication adherence by using [facility].  check glucose daily, document, and provide at future appointments target a minimum of 150 minutes of moderate intensity exercise weekly  Follow Up Plan: Telephone follow up appointment with care management team member scheduled for:10/26/2020        Medication Assistance: None required.  Patient affirms current coverage  meets needs.  Patient's preferred pharmacy is:  [facility] - [place], [place] - [address] [place] [address] Phone: [phone] Fax: [phone]  [id number] [place] [place] [address]Phone: [phone]: [phone] Uses pill box? Yes Pt endorses good compliance  We discussed: Benefits of medication synchronization, packaging and delivery as well as enhanced pharmacist oversight with [facility]. Patient decided to: Utilize [facility] for medication synchronization, packaging and delivery  Care Plan and Follow Up Patient Decision:  Patient agrees to Care Plan and Follow-up.  Plan: Telephone follow up appointment with care management team member scheduled for:  10/26/2020

## 2020-10-24 NOTE — Patient Instructions (Signed)
Visit Information   Goals Addressed   None    Patient Care Plan: CCM Pharmacy Care Plan     Problem Identified: dm, htn, hld   Priority: High  Onset Date: 06/24/2020     Long-Range Goal: Disease Management   Start Date: 06/24/2020  Expected End Date: 06/24/2021  Recent Progress: On track  Priority: High  Note:    Current Barriers:  Unable to independently monitor therapeutic efficacy  Pharmacist Clinical Goal(s):  Patient will achieve adherence to monitoring guidelines and medication adherence to achieve therapeutic efficacy maintain control of diabetes as evidenced by a1c  through collaboration with PharmD and provider.   Interventions: 1:1 collaboration with Cox, Kirsten, MD regarding development and update of comprehensive plan of care as evidenced by provider attestation and co-signature Inter-disciplinary care team collaboration (see longitudinal plan of care) Comprehensive medication review performed; medication list updated in electronic medical record  Hypertension (BP goal <130/80) -Controlled -Current treatment: hydrochlorothiazide 25 mg daily  Ramipril 2.5 mg daily  -Medications previously tried: none reported  -Current home readings: not checking  -Current dietary habits: eating more fruits and vegetables. Eats meat weekly. Limiting snack and drinking more water.  -Current exercise habits: walking and doing some home exercises each day.  -Denies hypotensive/hypertensive symptoms -Educated on BP goals and benefits of medications for prevention of heart attack, stroke and kidney damage; Daily salt intake goal < 2300 mg; Exercise goal of 150 minutes per week; -Counseled to monitor BP at home as needed, document, and provide log at future appointments -Counseled on diet and exercise extensively Recommended to continue current medication  Hyperlipidemia: (LDL goal < 70) -Not ideally controlled -Current treatment: atorvastatin 20 mg daily  Fenofibrate 160 mg  daily  -Medications previously tried: none reported  -Current dietary patterns: eating healthier - more fruits and vegetables -Current exercise habits: walking and completing home exercises -Educated on Cholesterol goals;  Benefits of statin for ASCVD risk reduction; Importance of limiting foods high in cholesterol; Exercise goal of 150 minutes per week; -Counseled on diet and exercise extensively Recommended to continue current medication  Diabetes (A1c goal <7%) -Controlled -Current medications: metformin 500 mg daily in the morning with food Ozempic 1 mg weekly  [employer] Delica and Verio testing supplies  -Medications previously tried: victoza  -Current home glucose readings fasting glucose: <130 mg/dL post prandial glucose: not reported -Denies hypoglycemic/hyperglycemic symptoms -Current meal patterns:  breakfast:  toast lunch:  vegetables, apples, eating meat once a week dinner: vegetables, eating meat once a week, grapes snacks: limiting snacks drinks: coffee, water or water with sugar free flavoring added -Current exercise: completing home exercises and walking outdoors -Educated on A1c and blood sugar goals; Exercise goal of 150 minutes per week; Benefits of weight loss; Benefits of routine self-monitoring of blood sugar; Carbohydrate counting and/or plate method -Counseled to check feet daily and get yearly eye exams -Counseled on diet and exercise extensively Recommended to continue current medication   Patient Goals/Self-Care Activities Patient will:  - take medications as prescribed focus on medication adherence by using [facility].  check glucose daily, document, and provide at future appointments target a minimum of 150 minutes of moderate intensity exercise weekly  Follow Up Plan: Telephone follow up appointment with care management team member scheduled for:10/26/2020       The patient verbalized understanding of instructions, educational materials,  and care plan provided today and declined offer to receive copy of patient instructions, educational materials, and care plan.  Telephone follow up appointment with  pharmacy team member scheduled for: 1 week   Earvin Hansen, [facility]

## 2020-10-26 ENCOUNTER — Ambulatory Visit (INDEPENDENT_AMBULATORY_CARE_PROVIDER_SITE_OTHER): Payer: Medicare Other

## 2020-10-26 DIAGNOSIS — E782 Mixed hyperlipidemia: Secondary | ICD-10-CM

## 2020-10-26 DIAGNOSIS — E1142 Type 2 diabetes mellitus with diabetic polyneuropathy: Secondary | ICD-10-CM

## 2020-10-26 DIAGNOSIS — I10 Essential (primary) hypertension: Secondary | ICD-10-CM | POA: Diagnosis not present

## 2020-10-26 NOTE — Progress Notes (Addendum)
Chronic Care Management Pharmacy Note  11/01/2020 Name:  Hailey Greene MRN:  [id number] DOB:  July 23, 1950   Plan Update:   Patient reports that things are going great. She feels better than she has in years. Has not taken amitriptyline in the past week. Stopped taking many of her medications 2 months.  BP  128/72 mmHg today with no medicine BS 118 mg/dL. Has continued Ozempic 1 mg weekly.  Recommended resuming atorvastatin and ramipril. Educated patient on importance of ace inhibitor for kidney protection and statin for cardiovascular prevention. Pharmacist confirmed this recommendation with Dr. Tobie Poet and made patient aware. Patient acknowledges understanding to resume ramipril and atorvastatin.   Subjective: Hailey Greene is an 69 y.o. year old female who is a primary patient of Cox, Kirsten, MD.  The CCM team was consulted for assistance with disease management and care coordination needs.    Engaged with patient face to face for follow up visit in response to provider referral for pharmacy case management and/or care coordination services.   Consent to Services:  The patient was given information about Chronic Care Management services, agreed to services, and gave verbal consent prior to initiation of services.  Please see initial visit note for detailed documentation.   Patient Care Team: Rochel Brome, MD as PCP - General (Family Medicine) Vickey Huger, MD as Consulting Physician (Orthopedic Surgery)  Recent office visits: 09/25/2020 - stop hctz. Referral to cardiology for monitor and echo. Encouraged hydration and standing slowly. Acute cystitis without hematuria - prescribed macrobid.   Recent consult visits: None reported  Hospital visits:  09/18/2020 - ED for orthostatic hypotension - fluids given and encouraged ongoing hydration at home.   Objective:  Lab Results  Component Value Date   CREATININE 0.86 09/25/2020   BUN 11 09/25/2020   GFRNONAA 53 (L) 09/18/2020    GFRAA 77 03/24/2020   NA 133 (L) 09/25/2020   K 4.9 09/25/2020   CALCIUM 9.6 09/25/2020   CO2 23 09/25/2020   GLUCOSE 109 (H) 09/25/2020    Lab Results  Component Value Date/Time   HGBA1C 5.7 (H) 09/25/2020 10:33 AM   HGBA1C 5.7 (H) 06/24/2020 11:13 AM   MICROALBUR 30 09/25/2020 10:36 AM   MICROALBUR 30 05/31/2019 11:19 AM    Last diabetic Eye exam: No results found for: HMDIABEYEEXA  Last diabetic Foot exam: No results found for: HMDIABFOOTEX   Lab Results  Component Value Date   CHOL 155 09/25/2020   HDL 58 09/25/2020   LDLCALC 77 09/25/2020   TRIG 115 09/25/2020   CHOLHDL 2.7 09/25/2020    Hepatic Function Latest Ref Rng & Units 09/25/2020 06/24/2020 03/24/2020  Total Protein 6.0 - 8.5 g/dL 6.3 6.0 6.4  Albumin 3.8 - 4.8 g/dL 4.0 4.0 4.0  AST 0 - 40 IU/L '12 15 11  ' ALT 0 - 32 IU/L '12 11 7  ' Alk Phosphatase 44 - 121 IU/L 80 73 92  Total Bilirubin 0.0 - 1.2 mg/dL 0.2 0.3 0.3    Lab Results  Component Value Date/Time   TSH 0.885 07/19/2019 10:00 PM   TSH 1.520 05/31/2019 11:20 AM   TSH 3.984 01/07/2014 08:45 AM    CBC Latest Ref Rng & Units 09/25/2020 09/18/2020 06/24/2020  WBC 3.4 - 10.8 x10E3/uL 9.4 9.9 10.5  Hemoglobin 11.1 - 15.9 g/dL 13.5 12.5 12.8  Hematocrit 34.0 - 46.6 % 41.6 37.9 39.0  Platelets 150 - 450 x10E3/uL 408 376 350    No results found for: VD25OH  Clinical  ASCVD: No  The 10-year ASCVD risk score Mikey Bussing DC Jr., et al., 2013) is: 16.3%   Values used to calculate the score:     Age: 16 years     Sex: Female     Is Non-Hispanic African American: No     Diabetic: Yes     Tobacco smoker: No     Systolic Blood Pressure: 233 mmHg     Is BP treated: Yes     HDL Cholesterol: 58 mg/dL     Total Cholesterol: 155 mg/dL    Depression screen [facility] 2/9 06/24/2020 03/10/2020 12/22/2019  Decreased Interest '3 1 3  ' Down, Depressed, Hopeless 2 3 -  PHQ - 2 Score '5 4 3  ' Altered sleeping 2 0 3  Tired, decreased energy '2 3 3  ' Change in appetite 0 3 3  Feeling  bad or failure about yourself  '1 3 3  ' Trouble concentrating '1 1 3  ' Moving slowly or fidgety/restless 2 0 3  Suicidal thoughts 0 0 0  PHQ-9 Score '13 14 21  ' Difficult doing work/chores Somewhat difficult - Extremely dIfficult     Social History   Tobacco Use  Smoking Status Former   Packs/day: 2.00   Years: 12.00   Pack years: 24.00   Types: Cigarettes   Quit date: 05/25/2010   Years since quitting: 10.4  Smokeless Tobacco Never   BP Readings from Last 3 Encounters:  09/25/20 118/62  09/18/20 136/62  06/24/20 (!) 110/58   Pulse Readings from Last 3 Encounters:  09/25/20 60  09/18/20 80  06/24/20 88   Wt Readings from Last 3 Encounters:  09/25/20 206 lb (93.4 kg)  06/24/20 221 lb (100.2 kg)  03/24/20 226 lb (102.5 kg)   BMI Readings from Last 3 Encounters:  09/25/20 36.49 kg/m  06/24/20 39.15 kg/m  03/24/20 40.03 kg/m    Assessment/Interventions: Review of patient past medical history, allergies, medications, health status, including review of consultants reports, laboratory and other test data, was performed as part of comprehensive evaluation and provision of chronic care management services.   SDOH:  (Social Determinants of Health) assessments and interventions performed: Yes  SDOH Screenings   Alcohol Screen: Not on file  Depression (PHQ2-9): Medium Risk   PHQ-2 Score: 13  Financial Resource Strain: High Risk   Difficulty of Paying Living Expenses: Hard  Food Insecurity: No Food Insecurity   Worried About Charity fundraiser in the Last Year: Never true   Ran Out of Food in the Last Year: Never true  Housing: Low Risk    Last Housing Risk Score: 0  Physical Activity: Insufficiently Active   Days of Exercise per Week: 7 days   Minutes of Exercise per Session: 20 min  Social Connections: Not on file  Stress: Not on file  Tobacco Use: Medium Risk   Smoking Tobacco Use: Former   Smokeless Tobacco Use: Never  Transportation Needs: No Estate agent (Medical): No   Lack of Transportation (Non-Medical): No    CCM Care Plan  No Known Allergies  Medications Reviewed Today     Reviewed by Burnice Logan, [facility] (Pharmacist) on 10/26/20 at 21  Med List Status: <None>   Medication Order Taking? Sig Documenting Provider Last Dose Status Informant  amitriptyline (ELAVIL) 50 MG tablet [id number] No TAKE ONE TABLET BY MOUTH EVERYDAY AT BEDTIME  Patient not taking: Reported on 10/26/2020   Rochel Brome, MD Not Taking Active   ASPIRIN 81 PO  [id number] Yes Take 81 mg by mouth daily. [provider] Taking Active   atorvastatin (LIPITOR) 20 MG tablet [id number] Yes TAKE ONE TABLET BY MOUTH ONCE DAILY Marge Duncans, PA-C Taking Active   Blood Glucose Monitoring Suppl (ONETOUCH VERIO) w/Device Drucie Opitz [id number] Yes Use daily to check FBS Cox, Kirsten, MD Taking Active   dicyclomine (BENTYL) 20 MG tablet [id number] No TAKE ONE TABLET BY MOUTH EVERY 6 HOURS AS NEEDED  Patient not taking: Reported on 10/26/2020   Marge Duncans, PA-C Not Taking Active   fenofibrate 160 MG tablet [id number] No TAKE ONE TABLET BY MOUTH ONCE DAILY  Patient not taking: Reported on 10/26/2020   Marge Duncans, PA-C Not Taking Active   fluticasone (FLONASE) 50 MCG/ACT nasal spray [id number] No SPRAY 2 SPRAYS INTO EACH NOSTRIL EVERY DAY  Patient not taking: Reported on 10/26/2020   Rochel Brome, MD Not Taking Active   glucose blood (ONETOUCH VERIO) test strip [id number] Yes E11.42 Use new test strip each time when checking FBS Cox, Kirsten, MD Taking Active   hydrochlorothiazide (HYDRODIURIL) 25 MG tablet [id number] No TAKE ONE TABLET BY MOUTH ONCE DAILY  Patient not taking: No sig reported   Marge Duncans, PA-C Not Taking Active   Lancets [facility] Donaciano Eva PLUS [id number]) [place] [id number] Yes E 11.42 Use new lancet each time when checking FBS Cox, Kirsten, MD Taking Active   loratadine (ALLERGY RELIEF) 10 MG tablet [id number] No TAKE ONE (1) TABLET BY MOUTH ONCE DAILY   Patient not taking: Reported on 10/26/2020   Rochel Brome, MD Not Taking Active   metFORMIN (GLUCOPHAGE) 500 MG tablet [id number] No TAKE 1 TABLET ONCE DAILY WITH MORNING MEAL  Patient not taking: Reported on 10/26/2020   Rochel Brome, MD Not Taking Active   Multiple Vitamin (MULTIVITAMIN WITH MINERALS) TABS tablet [id number] Yes Take 1 tablet by mouth daily. [provider] Taking Active Spouse/Significant Other  nitrofurantoin, macrocrystal-monohydrate, (MACROBID) 100 MG capsule [id number] No Take 1 capsule (100 mg total) by mouth 2 (two) times daily.  Patient not taking: No sig reported   Cox, Kirsten, MD Not Taking Active   omeprazole (PRILOSEC) 20 MG capsule [id number] No Take 1 capsule (20 mg total) by mouth daily.  Patient not taking: No sig reported   Cox, Kirsten, MD Not Taking Active   ondansetron (ZOFRAN) 4 MG tablet [id number] No One every 6 hours as needed for nausea and vomiting  Patient not taking: Reported on 10/26/2020   Lillard Anes, MD Not Taking Active   OZEMPIC, 1 MG/DOSE, 4 MG/3ML Bonney Aid [id number] Yes INJECT 12m into THE SKIN ONCE A WEEK AS DIRECTED Cox, Kirsten, MD Taking Active   phentermine (ADIPEX-P) 37.5 MG tablet [id number] TAKE ONE TABLET BY MOUTH EVERY MORNING  Patient not taking: Reported on 10/26/2020   CRochel Brome MD Not Taking Active   pregabalin (LYRICA) 100 MG capsule [id number] TAKE ONE CAPSULE BY MOUTH TWICE DAILY  Patient not taking: Reported on 10/26/2020   DMarge Duncans PA-C Not Taking Active   ramipril (ALTACE) 2.5 MG capsule [id number] TAKE ONE CAPSULE BY MOUTH ONCE DAILY DMarge Duncans PA-C Taking Active   rOPINIRole (REQUIP) 3 MG tablet [id number] TAKE ONE TABLET BY MOUTH ONCE DAILY 1-3 hours BEFORE bedtime  Patient not taking: Reported on 10/26/2020   CRochel Brome MD Not Taking Active   solifenacin (VESICARE) 5 MG tablet [id number] TAKE ONE TABLET BY MOUTH ONCE DAILY  Patient not taking: Reported on 10/26/2020   CRochel Brome MD Not  Taking Active   SYMBICORT 160-4.5 MCG/ACT inhaler [id number] No Inhale 2 puffs into the lungs 2 times daily at 12 noon and 4 pm.  Patient not taking: Reported on 10/26/2020   Rochel Brome, MD Not Taking Active   traZODone (DESYREL) 50 MG tablet [id number] No Take 0.5-1 tablets (25-50 mg total) by mouth at bedtime as needed for sleep.  Patient not taking: No sig reported   Cox, Elnita Maxwell, MD Not Taking Active   triamcinolone cream (KENALOG) 0.1 % [id number] No Apply 1 application topically 2 (two) times daily.  Patient not taking: Reported on 10/26/2020   Rochel Brome, MD Not Taking Active   TRINTELLIX 20 MG TABS tablet [id number] No TAKE ONE TABLET BY MOUTH ONCE DAILY  Patient not taking: Reported on 10/26/2020   Marge Duncans, PA-C Not Taking Active   vitamin B-12 1000 MCG tablet [id number] Yes Take 1 tablet (1,000 mcg total) by mouth daily. Raiford Noble Turley, DO Taking Active Spouse/Significant Other            Patient Active Problem List   Diagnosis Date Noted   BMI 40.0-44.9, adult ([place]) 03/10/2020   Mild recurrent major depression ([place]) 09/12/2019   Mixed hyperlipidemia 09/12/2019   Primary insomnia 09/12/2019   Nausea 08/07/2019   Status post total knee replacement 07/25/2019   Acute encephalopathy 07/19/2019   Hypokalemia 07/19/2019   Major depressive disorder    S/P total knee replacement 07/15/2019   Diarrhea of presumed infectious origin 06/06/2019   Class 1 obesity due to excess calories with serious comorbidity and body mass index (BMI) of 33.0 to 33.9 in adult 06/02/2019   Mild intermittent asthma without complication 16/57/9038   Medicare annual wellness visit, subsequent 05/31/2019   Primary osteoarthritis of left knee 05/31/2019   Diabetic polyneuropathy associated with type 2 diabetes mellitus ([place]) 05/31/2019   Acute non-recurrent maxillary sinusitis 05/08/2019   2019 novel coronavirus disease (COVID-19) 12/31/2018   Benign essential HTN 12/31/2018   Chronic pain of  left knee 12/11/2018   Incisional hernia 11/03/2016   Idiopathic progressive neuropathy 04/11/2014    Immunization History  Administered Date(s) Administered   Fluad Quad(high Dose 65+) 12/19/2019   Influenza-Unspecified 01/17/2018, 12/14/2018   PFIZER(Purple Top)SARS-COV-2 Vaccination 05/27/2019, 06/17/2019, 12/19/2019   Pneumococcal Conjugate-13 04/06/2017   Tdap 03/04/2019   Zoster Recombinat (Shingrix) 08/02/2017, 04/04/2018    Conditions to be addressed/monitored:  Hypertension, Hyperlipidemia and Diabetes  Care Plan : [place]  Updates made by Burnice Logan, [place] since 11/01/2020 12:00 AM     Problem: dm, htn, hld   Priority: High  Onset Date: 06/24/2020     Long-Range Goal: Disease Management   Start Date: 06/24/2020  Expected End Date: 06/24/2021  Recent Progress: On track  Priority: High  Note:    Current Barriers: Unable to independently monitor therapeutic efficacy   Pharmacist Clinical Goal(s):  Patient will achieve adherence to monitoring guidelines and medication adherence to achieve therapeutic efficacy maintain control of diabetes as evidenced by a1c  through collaboration with PharmD and provider.   Interventions: 1:1 collaboration with Cox, Kirsten, MD regarding development and update of comprehensive plan of care as evidenced by provider attestation and co-signature Inter-disciplinary care team collaboration (see longitudinal plan of care) Comprehensive medication review performed; medication list updated in electronic medical record   Hypertension (BP goal <130/80) -Controlled -Current treatment: hydrochlorothiazide 25 mg daily Ramipril 2.5 mg daily -Medications previously tried: none reported -Current home readings: not checking -Current dietary habits: eating more fruits  and vegetables. Eats meat weekly. Limiting snack and drinking more water. -Current exercise habits: walking and doing some home exercises each day. -Denies  hypotensive/hypertensive symptoms -Educated on BP goals and benefits of medications for prevention of heart attack, stroke and kidney damage; Daily salt intake goal < 2300 mg; Exercise goal of 150 minutes per week; -Counseled to monitor BP at home as needed, document, and provide log at future appointments -Counseled on diet and exercise extensively Recommended resuming ramipril for kidney protection.    Hyperlipidemia: (LDL goal < 70) -Not ideally controlled -Current treatment: atorvastatin 20 mg daily Fenofibrate 160 mg daily -Medications previously tried: none reported -Current dietary patterns: eating healthier - more fruits and vegetables -Current exercise habits: walking and completing home exercises -Educated on Cholesterol goals; Benefits of statin for ASCVD risk reduction; Importance of limiting foods high in cholesterol; Exercise goal of 150 minutes per week; -Counseled on diet and exercise extensively Recommended resuming atorvastatin.    Diabetes (A1c goal <7%) -Controlled -Current medications: metformin 500 mg daily in the morning with food - patient not taking for past 2 months Ozempic 1 mg weekly Onetouch Delica and Verio testing supplies -Medications previously tried: victoza -Current home glucose readings fasting glucose: <130 mg/dL post prandial glucose: not reported -Denies hypoglycemic/hyperglycemic symptoms -Current meal patterns: breakfast:  toast lunch:  vegetables, apples, eating meat once a week dinner: vegetables, eating meat once a week, grapes snacks: limiting snacks drinks: coffee, water or water with sugar free flavoring added -Current exercise: completing home exercises and walking outdoors -Educated on A1c and blood sugar goals; Exercise goal of 150 minutes per week; Benefits of weight loss; Benefits of routine self-monitoring of blood sugar; Carbohydrate counting and/or plate method -Counseled to check feet daily and get yearly eye  exams -Counseled on diet and exercise extensively Recommended continuing Ozempic.   Follow Up Plan: Telephone follow up appointment with care management team member scheduled for:3 months          Medication Assistance: None required.  Patient affirms current coverage meets needs.  Patient's preferred pharmacy is:  [facility] - [place], [place] - [address] [place] [address] Phone: [phone] Fax: [phone]  [id number] [place] [place] [address]Phone: [phone]: [phone] Uses pill box? Yes Pt endorses good compliance  We discussed: Benefits of medication synchronization, packaging and delivery as well as enhanced pharmacist oversight with [facility]. Patient decided to: Utilize [facility] for medication synchronization, packaging and delivery  Care Plan and Follow Up Patient Decision:  Patient agrees to Care Plan and Follow-up.  Plan: Telephone follow up appointment with care management team member scheduled for:  01/2021

## 2020-11-01 NOTE — Patient Instructions (Signed)
Visit Information   Goals Addressed   None    Patient Care Plan: CCM Pharmacy Care Plan     Problem Identified: dm, htn, hld   Priority: High  Onset Date: 06/24/2020     Long-Range Goal: Disease Management   Start Date: 06/24/2020  Expected End Date: 06/24/2021  Recent Progress: On track  Priority: High  Note:    Current Barriers: Unable to independently monitor therapeutic efficacy   Pharmacist Clinical Goal(s):  Patient will achieve adherence to monitoring guidelines and medication adherence to achieve therapeutic efficacy maintain control of diabetes as evidenced by a1c  through collaboration with PharmD and provider.   Interventions: 1:1 collaboration with Cox, Kirsten, MD regarding development and update of comprehensive plan of care as evidenced by provider attestation and co-signature Inter-disciplinary care team collaboration (see longitudinal plan of care) Comprehensive medication review performed; medication list updated in electronic medical record   Hypertension (BP goal <130/80) -Controlled -Current treatment: hydrochlorothiazide 25 mg daily Ramipril 2.5 mg daily -Medications previously tried: none reported -Current home readings: not checking -Current dietary habits: eating more fruits and vegetables. Eats meat weekly. Limiting snack and drinking more water. -Current exercise habits: walking and doing some home exercises each day. -Denies hypotensive/hypertensive symptoms -Educated on BP goals and benefits of medications for prevention of heart attack, stroke and kidney damage; Daily salt intake goal < 2300 mg; Exercise goal of 150 minutes per week; -Counseled to monitor BP at home as needed, document, and provide log at future appointments -Counseled on diet and exercise extensively Recommended resuming ramipril for kidney protection.    Hyperlipidemia: (LDL goal < 70) -Not ideally controlled -Current treatment: atorvastatin 20 mg daily Fenofibrate  160 mg daily -Medications previously tried: none reported -Current dietary patterns: eating healthier - more fruits and vegetables -Current exercise habits: walking and completing home exercises -Educated on Cholesterol goals; Benefits of statin for ASCVD risk reduction; Importance of limiting foods high in cholesterol; Exercise goal of 150 minutes per week; -Counseled on diet and exercise extensively Recommended resuming atorvastatin.    Diabetes (A1c goal <7%) -Controlled -Current medications: metformin 500 mg daily in the morning with food - patient not taking for past 2 months Ozempic 1 mg weekly Onetouch Delica and Verio testing supplies -Medications previously tried: victoza -Current home glucose readings fasting glucose: <130 mg/dL post prandial glucose: not reported -Denies hypoglycemic/hyperglycemic symptoms -Current meal patterns: breakfast:  toast lunch:  vegetables, apples, eating meat once a week dinner: vegetables, eating meat once a week, grapes snacks: limiting snacks drinks: coffee, water or water with sugar free flavoring added -Current exercise: completing home exercises and walking outdoors -Educated on A1c and blood sugar goals; Exercise goal of 150 minutes per week; Benefits of weight loss; Benefits of routine self-monitoring of blood sugar; Carbohydrate counting and/or plate method -Counseled to check feet daily and get yearly eye exams -Counseled on diet and exercise extensively Recommended continuing Ozempic.   Follow Up Plan: Telephone follow up appointment with care management team member scheduled for:3 months        The patient verbalized understanding of instructions, educational materials, and care plan provided today and declined offer to receive copy of patient instructions, educational materials, and care plan.  Telephone follow up appointment with pharmacy team member scheduled for: 01/2021  Earvin Hansen, [facility]

## 2020-11-04 ENCOUNTER — Other Ambulatory Visit: Payer: Self-pay | Admitting: Family Medicine

## 2020-11-04 DIAGNOSIS — L2089 Other atopic dermatitis: Secondary | ICD-10-CM

## 2020-11-04 NOTE — Telephone Encounter (Signed)
Refill sent to pharmacy.   

## 2020-12-10 ENCOUNTER — Other Ambulatory Visit: Payer: Self-pay | Admitting: Family Medicine

## 2020-12-10 ENCOUNTER — Telehealth: Payer: Self-pay

## 2020-12-10 NOTE — Chronic Care Management (AMB) (Signed)
Chronic Care Management Pharmacy Assistant   Name: LYNA LANINGHAM  MRN: [id number] DOB: 07-11-1950   Reason for Encounter: Medication Coordination for [facility]   Recent office visits:  None since 10/26/20  Recent consult visits:  None since 10/26/20  Hospital visits:  None since 10/26/20  Medications: Outpatient Encounter Medications as of 12/10/2020  Medication Sig   amitriptyline (ELAVIL) 50 MG tablet TAKE ONE TABLET BY MOUTH EVERYDAY AT BEDTIME (Patient not taking: Reported on 10/26/2020)   ASPIRIN 81 PO Take 81 mg by mouth daily.   atorvastatin (LIPITOR) 20 MG tablet TAKE ONE TABLET BY MOUTH ONCE DAILY   Blood Glucose Monitoring Suppl (ONETOUCH VERIO) w/Device KIT Use daily to check FBS   dicyclomine (BENTYL) 20 MG tablet TAKE ONE TABLET BY MOUTH EVERY 6 HOURS AS NEEDED (Patient not taking: Reported on 10/26/2020)   fenofibrate 160 MG tablet TAKE ONE TABLET BY MOUTH ONCE DAILY (Patient not taking: Reported on 10/26/2020)   fluticasone (FLONASE) 50 MCG/ACT nasal spray SPRAY 2 SPRAYS INTO EACH NOSTRIL EVERY DAY (Patient not taking: Reported on 10/26/2020)   glucose blood (ONETOUCH VERIO) test strip E11.42 Use new test strip each time when checking FBS   hydrochlorothiazide (HYDRODIURIL) 25 MG tablet TAKE ONE TABLET BY MOUTH ONCE DAILY (Patient not taking: No sig reported)   Lancets (ONETOUCH DELICA PLUS [id number]) MISC E 11.42 Use new lancet each time when checking FBS   loratadine (ALLERGY RELIEF) 10 MG tablet TAKE ONE (1) TABLET BY MOUTH ONCE DAILY (Patient not taking: Reported on 10/26/2020)   metFORMIN (GLUCOPHAGE) 500 MG tablet TAKE 1 TABLET ONCE DAILY WITH MORNING MEAL (Patient not taking: Reported on 10/26/2020)   Multiple Vitamin (MULTIVITAMIN WITH MINERALS) TABS tablet Take 1 tablet by mouth daily.   nitrofurantoin, macrocrystal-monohydrate, (MACROBID) 100 MG capsule Take 1 capsule (100 mg total) by mouth 2 (two) times daily. (Patient not taking: No sig reported)   omeprazole  (PRILOSEC) 20 MG capsule Take 1 capsule (20 mg total) by mouth daily. (Patient not taking: No sig reported)   ondansetron (ZOFRAN) 4 MG tablet One every 6 hours as needed for nausea and vomiting (Patient not taking: Reported on 10/26/2020)   OZEMPIC, 1 MG/DOSE, 4 MG/3ML SOPN INJECT 23m into THE SKIN ONCE A WEEK AS DIRECTED   phentermine (ADIPEX-P) 37.5 MG tablet TAKE ONE TABLET BY MOUTH EVERY MORNING (Patient not taking: Reported on 10/26/2020)   pregabalin (LYRICA) 100 MG capsule TAKE ONE CAPSULE BY MOUTH TWICE DAILY (Patient not taking: Reported on 10/26/2020)   ramipril (ALTACE) 2.5 MG capsule TAKE ONE CAPSULE BY MOUTH ONCE DAILY   rOPINIRole (REQUIP) 3 MG tablet TAKE ONE TABLET BY MOUTH ONCE DAILY 1-3 hours BEFORE bedtime (Patient not taking: Reported on 10/26/2020)   solifenacin (VESICARE) 5 MG tablet TAKE ONE TABLET BY MOUTH ONCE DAILY (Patient not taking: Reported on 10/26/2020)   SYMBICORT 160-4.5 MCG/ACT inhaler Inhale 2 puffs into the lungs 2 times daily at 12 noon and 4 pm. (Patient not taking: Reported on 10/26/2020)   traZODone (DESYREL) 50 MG tablet Take 0.5-1 tablets (25-50 mg total) by mouth at bedtime as needed for sleep. (Patient not taking: No sig reported)   triamcinolone cream (KENALOG) 0.1 % APPLY 1 APPLICATION TOPICALLY TWICE A DAY. COVERAGE TERMINATED 6/30   TRINTELLIX 20 MG TABS tablet TAKE ONE TABLET BY MOUTH ONCE DAILY (Patient not taking: Reported on 10/26/2020)   vitamin B-12 1000 MCG tablet Take 1 tablet (1,000 mcg total) by mouth daily.   No facility-administered  encounter medications on file as of 12/10/2020.   Reviewed chart for medication changes ahead of medication coordination call.  No OVs, Consults, or hospital visits since last care coordination call/Pharmacist visit. (If appropriate, list visit date, provider name)  No medication changes indicated OR if recent visit, treatment plan here.  BP Readings from Last 3 Encounters:  09/25/20 118/62  09/18/20 136/62  06/24/20  (!) 110/58    Lab Results  Component Value Date   HGBA1C 5.7 (H) 09/25/2020     Patient obtains medications through Vials  90 Days   Last adherence delivery included:  Atorvastatin 20 mg 1 tablet daily   Dicylclomine 20 mg every 6 hours prn Fenofibrate 160 mg 1 tablet daily   Hydrochlorothiazide 25 mg 1 tablet daily Loratadine 10 mg 1 tablet daily   Omeprazole 20 mg 1 tablet daily Ondansetron 4 mg q8h prn nausea   Ozempic 1 mg/dose (34m/3mL) Inject in the skin once a week as directed Pregabalin 100 mg 1 capsule twice daily Ramipril 2.5 mg 1 tablet daily Ropinirole 3 mg 1 tablet daily   Solifenacin (Vesicare) 5 mg 1 tablet daily Trintellix 20 mg 1 tablet daily   Metformin 5038mTrazadone 5050m Patient declined  last month due to  Preservision-has on hand Symbicort- has on hand Vitamin B 12  1000m11maily-gets from other pharmacy  Patient is due for next adherence delivery on: 12/21/20. Called patient and reviewed medications and coordinated delivery.  This delivery to include: Atorvastatin 20 mg 1 tablet daily   Dicylclomine 20 mg every 6 hours prn Fenofibrate 160 mg 1 tablet daily   Hydrochlorothiazide 25 mg 1 tablet daily Loratadine 10 mg 1 tablet daily   Omeprazole 20 mg 1 tablet daily Ondansetron 4 mg q8h prn nausea   Ozempic 1 mg/dose (4mg/42m) Inject in the skin once a week as directed Pregabalin 100 mg 1 capsule twice daily Ramipril 2.5 mg 1 tablet daily Ropinirole 3 mg 1 tablet daily   Solifenacin (Vesicare) 5 mg 1 tablet daily Trintellix 20 mg 1 tablet daily   Metformin 500mg 41mblet every morning  Trazadone 50mg  28mouch test strips use as directed OnetoucH. J. Heinzs use as directed Amitriptyline 40 mg 1 tab at bedtime   Patient needs refills for  Ondansetron 4 mg 1 tablet every 6 hours PRN Solifenacin 5 mg 1 tablet daily Ropinirole 3 mg 1 tab once daily  Metformin 500mg 1 43met every morning  Trazadone 50mg   C46mrmed delivery date of  12/21/20, advised patient that pharmacy will contact them the morning of delivery.  Danielle Shungnak

## 2020-12-15 ENCOUNTER — Other Ambulatory Visit: Payer: Self-pay | Admitting: Family Medicine

## 2020-12-15 ENCOUNTER — Other Ambulatory Visit: Payer: Self-pay | Admitting: Legal Medicine

## 2020-12-17 ENCOUNTER — Other Ambulatory Visit: Payer: Self-pay

## 2020-12-17 ENCOUNTER — Ambulatory Visit: Payer: Medicare Other

## 2020-12-17 ENCOUNTER — Encounter: Payer: Self-pay | Admitting: Family Medicine

## 2020-12-17 DIAGNOSIS — E782 Mixed hyperlipidemia: Secondary | ICD-10-CM

## 2020-12-17 DIAGNOSIS — Z23 Encounter for immunization: Secondary | ICD-10-CM

## 2020-12-17 DIAGNOSIS — L2089 Other atopic dermatitis: Secondary | ICD-10-CM

## 2020-12-17 DIAGNOSIS — E1142 Type 2 diabetes mellitus with diabetic polyneuropathy: Secondary | ICD-10-CM

## 2020-12-17 DIAGNOSIS — I10 Essential (primary) hypertension: Secondary | ICD-10-CM

## 2020-12-17 MED ORDER — ATORVASTATIN CALCIUM 20 MG PO TABS: ORAL_TABLET | ORAL | 1 refills | Status: DC

## 2020-12-17 MED ORDER — OZEMPIC (1 MG/DOSE) 4 MG/3ML ~~LOC~~ SOPN: PEN_INJECTOR | SUBCUTANEOUS | 1 refills | Status: DC

## 2020-12-17 MED ORDER — TRIAMCINOLONE ACETONIDE 0.1 % EX CREA: TOPICAL_CREAM | CUTANEOUS | 0 refills | Status: DC

## 2020-12-17 MED ORDER — RAMIPRIL 2.5 MG PO CAPS: 2.5000 mg | ORAL_CAPSULE | Freq: Every day | ORAL | 1 refills | Status: DC

## 2020-12-17 NOTE — Progress Notes (Signed)
   Covid-19 Vaccination Clinic  Name:  Hailey Greene    MRN: [id number] DOB: 12-08-1950  12/17/2020  [name] was observed post Covid-19 immunization for 15 minutes without incident. She was provided with Vaccine Information Sheet and instruction to access the V-Safe system.   [name] was instructed to call [phone] with any severe reactions post vaccine: Difficulty breathing  Swelling of face and throat  A fast heartbeat  A bad rash all over body  Dizziness and weakness

## 2020-12-22 ENCOUNTER — Other Ambulatory Visit: Payer: Self-pay | Admitting: Family Medicine

## 2020-12-22 ENCOUNTER — Other Ambulatory Visit: Payer: Self-pay

## 2020-12-22 DIAGNOSIS — J301 Allergic rhinitis due to pollen: Secondary | ICD-10-CM

## 2020-12-22 DIAGNOSIS — E1142 Type 2 diabetes mellitus with diabetic polyneuropathy: Secondary | ICD-10-CM

## 2020-12-22 MED ORDER — PREGABALIN 100 MG PO CAPS: 100.0000 mg | ORAL_CAPSULE | Freq: Two times a day (BID) | ORAL | 0 refills | Status: DC

## 2021-01-01 ENCOUNTER — Ambulatory Visit: Payer: 59 | Admitting: Family Medicine

## 2021-01-06 ENCOUNTER — Ambulatory Visit: Payer: Medicare Other | Admitting: Family Medicine

## 2021-01-07 ENCOUNTER — Other Ambulatory Visit: Payer: Self-pay

## 2021-01-07 NOTE — Telephone Encounter (Signed)
Error

## 2021-01-22 ENCOUNTER — Other Ambulatory Visit: Payer: Self-pay

## 2021-01-22 ENCOUNTER — Ambulatory Visit (INDEPENDENT_AMBULATORY_CARE_PROVIDER_SITE_OTHER): Payer: Medicare Other | Admitting: Family Medicine

## 2021-01-22 ENCOUNTER — Encounter: Payer: Self-pay | Admitting: Family Medicine

## 2021-01-22 VITALS — BP 128/72 | HR 88 | Temp 97.5°F | Resp 18 | Ht 63.0 in | Wt 204.0 lb

## 2021-01-22 DIAGNOSIS — G603 Idiopathic progressive neuropathy: Secondary | ICD-10-CM

## 2021-01-22 DIAGNOSIS — I10 Essential (primary) hypertension: Secondary | ICD-10-CM

## 2021-01-22 DIAGNOSIS — E782 Mixed hyperlipidemia: Secondary | ICD-10-CM

## 2021-01-22 DIAGNOSIS — Z6836 Body mass index (BMI) 36.0-36.9, adult: Secondary | ICD-10-CM

## 2021-01-22 DIAGNOSIS — L2089 Other atopic dermatitis: Secondary | ICD-10-CM | POA: Diagnosis not present

## 2021-01-22 DIAGNOSIS — E1142 Type 2 diabetes mellitus with diabetic polyneuropathy: Secondary | ICD-10-CM | POA: Diagnosis not present

## 2021-01-22 DIAGNOSIS — F5101 Primary insomnia: Secondary | ICD-10-CM

## 2021-01-22 DIAGNOSIS — K439 Ventral hernia without obstruction or gangrene: Secondary | ICD-10-CM | POA: Diagnosis not present

## 2021-01-22 MED ORDER — PHENTERMINE HCL 37.5 MG PO CAPS: 37.5000 mg | ORAL_CAPSULE | ORAL | 2 refills | Status: DC

## 2021-01-22 MED ORDER — ESZOPICLONE 2 MG PO TABS: 2.0000 mg | ORAL_TABLET | Freq: Every evening | ORAL | 2 refills | Status: DC | PRN

## 2021-01-22 MED ORDER — TRIAMCINOLONE ACETONIDE 0.1 % EX CREA: TOPICAL_CREAM | CUTANEOUS | 3 refills | Status: DC

## 2021-01-22 MED ORDER — SEMAGLUTIDE (2 MG/DOSE) 8 MG/3ML ~~LOC~~ SOPN: 2.0000 mg | PEN_INJECTOR | SUBCUTANEOUS | 0 refills | Status: DC

## 2021-01-22 NOTE — Assessment & Plan Note (Signed)
Continue Lyrica 100 mg twice daily. Well-controlled.

## 2021-01-22 NOTE — Assessment & Plan Note (Signed)
Continue to eat healthy. Continue Adipex 37.5 mg once daily in AM. Increase Ozempic to 2 mg weekly. Patient to see diabetes specialist, Hailey Greene this coming month. Patient instructed to bring a food log.

## 2021-01-22 NOTE — Assessment & Plan Note (Signed)
Reassurance given. Pt to monitor and if worsening, recommend refer to general surgery.

## 2021-01-22 NOTE — Assessment & Plan Note (Signed)
Recommend continue to work on eating healthy diet and exercise. The current medical regimen is effective;  continue present plan and medications. Check labs today.  Continue lipitor 20 mg once daily.

## 2021-01-22 NOTE — Progress Notes (Signed)
Subjective:  Patient ID: Hailey Greene, female    DOB: 06-17-1950  Age: 70 y.o. MRN: [id number]  Chief Complaint  Patient presents with   Diabetes   Hypertension    HPI DM: ozempic 1 mg daily, metformin 500 mg once daily. ON ramipril 25 mg once daily.  Sugars: every other day. 116 is highest.  HTN: Diet controlled. Not requiring medicine. 120/80 for bp..  Hyperlipidemia: on lipitor 20 mg once daily. Aspirin 81 mg once daily.  Idiopathic nueroapthy. Lyrica 100 mg one twice a day.  Urge incontinence: vesicare working.  Insomnia: trazzodone 50 mg 1 qhs. Gets 5-6 hours per day.  Weight mgmt: pt has been taking phentermine 37.5 mg once daily. Lost weigh initially, but seems to have plateaued.       Current Outpatient Medications on File Prior to Visit  Medication Sig Dispense Refill   ASPIRIN 81 PO Take 81 mg by mouth daily.     atorvastatin (LIPITOR) 20 MG tablet TAKE ONE TABLET BY MOUTH ONCE DAILY 90 tablet 1   Blood Glucose Monitoring Suppl (ONETOUCH VERIO) w/Device KIT Use daily to check FBS 1 kit 0   fluticasone (FLONASE) 50 MCG/ACT nasal spray SPRAY 2 SPRAYS INTO EACH NOSTRIL EVERY DAY 48 mL 2   glucose blood (ONETOUCH VERIO) test strip E11.42 Use new test strip each time when checking FBS 100 each 2   Lancets (ONETOUCH DELICA PLUS BFXOVA91B) MISC E 11.42 Use new lancet each time when checking FBS 100 each 2   Multiple Vitamin (MULTIVITAMIN WITH MINERALS) TABS tablet Take 1 tablet by mouth daily.     ondansetron (ZOFRAN) 4 MG tablet TAKE ONE TABLET BY MOUTH every SIX hours AS NEEDED FOR FOR NAUSEA AND VOMITING 90 tablet 1   pregabalin (LYRICA) 100 MG capsule Take 1 capsule (100 mg total) by mouth 2 (two) times daily. 180 capsule 0   ramipril (ALTACE) 2.5 MG capsule Take 1 capsule (2.5 mg total) by mouth daily. 90 capsule 1   solifenacin (VESICARE) 5 MG tablet TAKE ONE TABLET BY MOUTH ONCE DAILY 90 tablet 1   traZODone (DESYREL) 50 MG tablet TAKE 1/2 TO 1 TAB BY MOUTH AT  bedtime AS NEEDED FOR SLEEP 30 tablet 5   vitamin B-12 1000 MCG tablet Take 1 tablet (1,000 mcg total) by mouth daily. 30 tablet 0   No current facility-administered medications on file prior to visit.   Past Medical History:  Diagnosis Date   Acute hypoxemic respiratory failure due to COVID-19 (HCC)    Chicken pox    Depression    Diabetes (HCC)    GERD (gastroesophageal reflux disease)    Hypertension    Idiopathic progressive neuropathy    Major depressive disorder    Mixed hyperlipidemia    Neuropathy    Osteoarthritis    Pneumonia    Primary insomnia    RLS (restless legs syndrome)    Tachycardia    Urge incontinence    UTI (lower urinary tract infection)    Weakness    Past Surgical History:  Procedure Laterality Date   ABDOMINAL HYSTERECTOMY     APPENDECTOMY     CHOLECYSTECTOMY     HERNIA REPAIR     REPLACEMENT TOTAL KNEE Right    TONSILLECTOMY     TOTAL KNEE ARTHROPLASTY Left 07/15/2019   Procedure: TOTAL KNEE ARTHROPLASTY;  Surgeon: Vickey Huger, MD;  Location: [facility];  Service: Orthopedics;  Laterality: Left;    Family History  Problem Relation Age of  Onset   Thyroid disease Mother    Cancer Mother    Migraines Mother    Brain cancer Father    Heart disease Maternal Grandmother    Diabetes Daughter    Social History   Socioeconomic History   Marital status: Married    Spouse name: Not on file   Number of children: 3   Years of education: 12   Highest education level: Not on file  Occupational History   Occupation: Housewife  Tobacco Use   Smoking status: Former    Packs/day: 2.00    Years: 12.00    Pack years: 24.00    Types: Cigarettes    Quit date: 05/25/2010    Years since quitting: 10.6   Smokeless tobacco: Never  Vaping Use   Vaping Use: Never used  Substance and Sexual Activity   Alcohol use: Yes    Comment: occasional   Drug use: No   Sexual activity: Not on file  Other Topics Concern   Not on file  Social History Narrative    Born and raised in [place], [place]. Currently lives in a house with her husband. 1 dog. Fun: Garden, feed birds, swimming   Denies any religious beliefs effecting health care.    Social Determinants of Health   Financial Resource Strain: High Risk   Difficulty of Paying Living Expenses: Hard  Food Insecurity: Not on file  Transportation Needs: No Transportation Needs   Lack of Transportation (Medical): No   Lack of Transportation (Non-Medical): No  Physical Activity: Insufficiently Active   Days of Exercise per Week: 7 days   Minutes of Exercise per Session: 20 min  Stress: Not on file  Social Connections: Not on file    Review of Systems  Constitutional:  Positive for fatigue. Negative for chills and fever.  HENT:  Positive for congestion and rhinorrhea. Negative for ear pain and sore throat.   Respiratory:  Negative for cough and shortness of breath.   Cardiovascular:  Negative for chest pain.  Gastrointestinal:  Positive for nausea. Negative for abdominal pain, constipation, diarrhea and vomiting.       Abdominal hernia. Does not hurt. Was fixed at one point, but now has come back.   Endocrine: Negative for polydipsia, polyphagia and polyuria.  Genitourinary:  Negative for dysuria and urgency.  Musculoskeletal:  Negative for arthralgias, back pain and myalgias.  Skin:  Positive for rash (resolved.).  Neurological:  Negative for dizziness, weakness, light-headedness and headaches.  Psychiatric/Behavioral:  Negative for dysphoric mood. The patient is not nervous/anxious.     Objective:  BP 128/72   Pulse 88   Temp (!) 97.5 F (36.4 C)   Resp 18   Ht _0  (1.6 m)   Wt 204 lb (92.5 kg)   BMI 36.14 kg/m   BP/Weight 01/22/2021 09/25/2020 06/21/7122  Systolic BP 580 998 338  Diastolic BP 72 62 62  Wt. (Lbs) 204 206 -  BMI 36.14 36.49 -    Physical Exam Vitals reviewed.  Constitutional:      Appearance: Normal appearance. She is normal weight.  Neck:     Vascular: No  carotid bruit.  Cardiovascular:     Rate and Rhythm: Normal rate and regular rhythm.     Pulses: Normal pulses.     Heart sounds: Normal heart sounds.  Pulmonary:     Effort: Pulmonary effort is normal. No respiratory distress.     Breath sounds: Normal breath sounds.  Abdominal:     General:  Abdomen is flat. Bowel sounds are normal.     Palpations: Abdomen is soft.     Tenderness: There is no abdominal tenderness.     Hernia: A hernia (ventral 4 cm in diameter.nontender.) is present.  Neurological:     Mental Status: She is alert and oriented to person, place, and time.  Psychiatric:        Mood and Affect: Mood normal.        Behavior: Behavior normal.    Diabetic Foot Exam - Simple   Simple Foot Form Diabetic Foot exam was performed with the following findings: Yes 01/22/2021 11:38 AM  Visual Inspection No deformities, no ulcerations, no other skin breakdown bilaterally: Yes Sensation Testing Intact to touch and monofilament testing bilaterally: Yes Pulse Check Posterior Tibialis and Dorsalis pulse intact bilaterally: Yes Comments      Lab Results  Component Value Date   WBC 9.4 09/25/2020   HGB 13.5 09/25/2020   HCT 41.6 09/25/2020   PLT 408 09/25/2020   GLUCOSE 109 (H) 09/25/2020   CHOL 155 09/25/2020   TRIG 115 09/25/2020   HDL 58 09/25/2020   LDLCALC 77 09/25/2020   ALT 12 09/25/2020   AST 12 09/25/2020   NA 133 (L) 09/25/2020   K 4.9 09/25/2020   CL 95 (L) 09/25/2020   CREATININE 0.86 09/25/2020   BUN 11 09/25/2020   CO2 23 09/25/2020   TSH 0.885 07/19/2019   HGBA1C 5.7 (H) 09/25/2020   MICROALBUR 30 09/25/2020      Assessment & Plan:   Problem List Items Addressed This Visit       Cardiovascular and Mediastinum   Benign essential HTN - Primary    Continue eating healthy and exercising. No medicines recommended at this time.      Relevant Orders   CBC with Differential/Platelet   Comprehensive metabolic panel   TSH     Endocrine    Diabetic polyneuropathy associated with type 2 diabetes mellitus (HCC)    Increase ozempic 2 mg once weekly. Stop metformin.  Continue adipex Keep a food log.  Refer to diabetes specialist: for diet guidance. We will call you with appointment Increase exercise.       Relevant Medications   eszopiclone (LUNESTA) 2 MG TABS tablet   Semaglutide, 2 MG/DOSE, 8 MG/3ML SOPN   phentermine 37.5 MG capsule   Other Relevant Orders   Hemoglobin A1c     Nervous and Auditory   Idiopathic progressive neuropathy    Continue Lyrica 100 mg twice daily. Well-controlled.        Musculoskeletal and Integument   Other atopic dermatitis   Relevant Medications   triamcinolone cream (KENALOG) 0.1 %     Other   Mixed hyperlipidemia    Recommend continue to work on eating healthy diet and exercise. The current medical regimen is effective;  continue present plan and medications. Check labs today.  Continue lipitor 20 mg once daily.       Relevant Orders   Lipid panel   Primary insomnia    Trazodone is not helping. Start Lunesta 2 mg 1 nightly.  Discontinue trazodone.      Other Visit Diagnoses     Ventral hernia without obstruction or gangrene         .  Meds ordered this encounter  Medications   eszopiclone (LUNESTA) 2 MG TABS tablet    Sig: Take 1 tablet (2 mg total) by mouth at bedtime as needed for sleep. Take immediately before bedtime  Dispense:  30 tablet    Refill:  2   Semaglutide, 2 MG/DOSE, 8 MG/3ML SOPN    Sig: Inject 2 mg as directed once a week.    Dispense:  9 mL    Refill:  0   triamcinolone cream (KENALOG) 0.1 %    Sig: APPLY 1 APPLICATION TOPICALLY TWICE A DAY. COVERAGE TERMINATED 6/30    Dispense:  80 g    Refill:  3   phentermine 37.5 MG capsule    Sig: Take 1 capsule (37.5 mg total) by mouth every morning.    Dispense:  30 capsule    Refill:  2    Orders Placed This Encounter  Procedures   CBC with Differential/Platelet   Comprehensive metabolic  panel   Hemoglobin A1c   Lipid panel   TSH     Follow-up: Return in about 3 months (around 04/24/2021) for chronic fasting.  An After Visit Summary was printed and given to the patient.  Rochel Brome, MD [facility] [phone]

## 2021-01-22 NOTE — Patient Instructions (Addendum)
Increase ozempic 2 mg once weekly. Stop metformin.  Continue adipex Keep a food log.  Refer to diabetes specialist: for diet guidance. We will call you with appointment Increase exercise.

## 2021-01-22 NOTE — Assessment & Plan Note (Signed)
Increase ozempic 2 mg once weekly. Stop metformin.  Continue adipex Keep a food log.  Refer to diabetes specialist: for diet guidance. We will call you with appointment Increase exercise.   

## 2021-01-22 NOTE — Assessment & Plan Note (Signed)
Trazodone is not helping. Start Lunesta 2 mg 1 nightly.  Discontinue trazodone.

## 2021-01-22 NOTE — Assessment & Plan Note (Signed)
Continue eating healthy and exercising. No medicines recommended at this time.

## 2021-01-23 LAB — CBC WITH DIFFERENTIAL/PLATELET
Basophils Absolute: 0 10*3/uL (ref 0.0–0.2)
Basos: 0 %
EOS (ABSOLUTE): 0.1 10*3/uL (ref 0.0–0.4)
Eos: 1 %
Hematocrit: 42.7 % (ref 34.0–46.6)
Hemoglobin: 14.1 g/dL (ref 11.1–15.9)
Immature Grans (Abs): 0 10*3/uL (ref 0.0–0.1)
Immature Granulocytes: 0 %
Lymphocytes Absolute: 1.9 10*3/uL (ref 0.7–3.1)
Lymphs: 20 %
MCH: 29.1 pg (ref 26.6–33.0)
MCHC: 33 g/dL (ref 31.5–35.7)
MCV: 88 fL (ref 79–97)
Monocytes Absolute: 0.5 10*3/uL (ref 0.1–0.9)
Monocytes: 6 %
Neutrophils Absolute: 7 10*3/uL (ref 1.4–7.0)
Neutrophils: 73 %
Platelets: 278 10*3/uL (ref 150–450)
RBC: 4.84 x10E6/uL (ref 3.77–5.28)
RDW: 11.9 % (ref 11.7–15.4)
WBC: 9.5 10*3/uL (ref 3.4–10.8)

## 2021-01-23 LAB — COMPREHENSIVE METABOLIC PANEL
ALT: 8 IU/L (ref 0–32)
AST: 12 IU/L (ref 0–40)
Albumin/Globulin Ratio: 1.5 (ref 1.2–2.2)
Albumin: 4 g/dL (ref 3.8–4.8)
Alkaline Phosphatase: 117 IU/L (ref 44–121)
BUN/Creatinine Ratio: 15 (ref 12–28)
BUN: 12 mg/dL (ref 8–27)
Bilirubin Total: 0.3 mg/dL (ref 0.0–1.2)
CO2: 24 mmol/L (ref 20–29)
Calcium: 9.2 mg/dL (ref 8.7–10.3)
Chloride: 99 mmol/L (ref 96–106)
Creatinine, Ser: 0.81 mg/dL (ref 0.57–1.00)
Globulin, Total: 2.7 g/dL (ref 1.5–4.5)
Glucose: 98 mg/dL (ref 70–99)
Potassium: 4.3 mmol/L (ref 3.5–5.2)
Sodium: 137 mmol/L (ref 134–144)
Total Protein: 6.7 g/dL (ref 6.0–8.5)
eGFR: 79 mL/min/{1.73_m2} (ref >59)

## 2021-01-23 LAB — CARDIOVASCULAR RISK ASSESSMENT

## 2021-01-23 LAB — LIPID PANEL
Chol/HDL Ratio: 3.5 ratio (ref 0.0–4.4)
Cholesterol, Total: 186 mg/dL (ref 100–199)
HDL: 53 mg/dL (ref >39)
LDL Chol Calc (NIH): 104 mg/dL — ABNORMAL HIGH (ref 0–99)
Triglycerides: 170 mg/dL — ABNORMAL HIGH (ref 0–149)
VLDL Cholesterol Cal: 29 mg/dL (ref 5–40)

## 2021-01-23 LAB — HEMOGLOBIN A1C
Est. average glucose Bld gHb Est-mCnc: 114 mg/dL
Hgb A1c MFr Bld: 5.6 % (ref 4.8–5.6)

## 2021-01-23 LAB — TSH: TSH: 1.4 u[IU]/mL (ref 0.450–4.500)

## 2021-01-25 ENCOUNTER — Other Ambulatory Visit: Payer: Self-pay

## 2021-01-25 DIAGNOSIS — E782 Mixed hyperlipidemia: Secondary | ICD-10-CM

## 2021-01-25 MED ORDER — SEMAGLUTIDE (2 MG/DOSE) 8 MG/3ML ~~LOC~~ SOPN: 2.0000 mg | PEN_INJECTOR | SUBCUTANEOUS | 0 refills | Status: DC

## 2021-01-25 MED ORDER — ATORVASTATIN CALCIUM 20 MG PO TABS: ORAL_TABLET | ORAL | 1 refills | Status: DC

## 2021-01-26 ENCOUNTER — Other Ambulatory Visit: Payer: Self-pay

## 2021-01-26 DIAGNOSIS — E782 Mixed hyperlipidemia: Secondary | ICD-10-CM

## 2021-01-26 MED ORDER — SEMAGLUTIDE (2 MG/DOSE) 8 MG/3ML ~~LOC~~ SOPN: 2.0000 mg | PEN_INJECTOR | SUBCUTANEOUS | 0 refills | Status: DC

## 2021-01-26 MED ORDER — ATORVASTATIN CALCIUM 20 MG PO TABS: ORAL_TABLET | ORAL | 0 refills | Status: DC

## 2021-01-26 NOTE — Telephone Encounter (Signed)
[facility] faxed over request for patient for refill on promethazine and the patient does not use [facility] she [facility]. Called patient and left message for her to call back and verify so we can get it sent in to the correct pharmacy. [place]

## 2021-01-27 ENCOUNTER — Telehealth: Payer: Self-pay

## 2021-01-27 ENCOUNTER — Other Ambulatory Visit: Payer: Self-pay

## 2021-01-27 DIAGNOSIS — E1142 Type 2 diabetes mellitus with diabetic polyneuropathy: Secondary | ICD-10-CM

## 2021-01-27 DIAGNOSIS — L2089 Other atopic dermatitis: Secondary | ICD-10-CM

## 2021-01-27 DIAGNOSIS — I10 Essential (primary) hypertension: Secondary | ICD-10-CM

## 2021-01-27 DIAGNOSIS — J301 Allergic rhinitis due to pollen: Secondary | ICD-10-CM

## 2021-01-27 DIAGNOSIS — E782 Mixed hyperlipidemia: Secondary | ICD-10-CM

## 2021-01-27 MED ORDER — RAMIPRIL 2.5 MG PO CAPS: 2.5000 mg | ORAL_CAPSULE | Freq: Every day | ORAL | 1 refills | Status: DC

## 2021-01-27 MED ORDER — PHENTERMINE HCL 37.5 MG PO CAPS: 37.5000 mg | ORAL_CAPSULE | ORAL | 2 refills | Status: DC

## 2021-01-27 MED ORDER — TRAZODONE HCL 50 MG PO TABS: ORAL_TABLET | ORAL | 5 refills | Status: DC

## 2021-01-27 MED ORDER — SOLIFENACIN SUCCINATE 5 MG PO TABS: 5.0000 mg | ORAL_TABLET | Freq: Every day | ORAL | 1 refills | Status: DC

## 2021-01-27 MED ORDER — FLUTICASONE PROPIONATE 50 MCG/ACT NA SUSP
NASAL | 2 refills | Status: DC
Start: 2021-01-27 — End: 2021-08-10

## 2021-01-27 MED ORDER — PREGABALIN 100 MG PO CAPS: 100.0000 mg | ORAL_CAPSULE | Freq: Two times a day (BID) | ORAL | 0 refills | Status: DC

## 2021-01-27 MED ORDER — ONDANSETRON HCL 4 MG PO TABS: ORAL_TABLET | ORAL | 1 refills | Status: DC

## 2021-01-27 NOTE — Chronic Care Management (AMB) (Signed)
    Chronic Care Management Pharmacy Assistant   Name: AVEY MCMANAMON  MRN: [id number] DOB: Jul 09, 1950  01/27/2021- Notification received from [facility] that 4 new medications were sent but patient's profile was transferred to [facility]. Atorvastatin, Ozempic, Phentermine, Triamcinolone Cream and Lunesta. Called patient to see if she needed these medications, per patient her insurance prefers her to get her medications from [facility] order, she wants all medications to go there. Informed patient that as of yesterday PCP office sent Atorvastatin and Ozempic to [facility], will request Phentermine, Triamcinolone Cream and Lunesta to be sent to [facility] also. Message sent to Dr Sedalia Muta CMA to send to [facility] per patient.   Billee Cashing, CMA Clinical Pharmacist Assistant [phone]

## 2021-01-28 ENCOUNTER — Other Ambulatory Visit: Payer: Self-pay

## 2021-01-28 DIAGNOSIS — L2089 Other atopic dermatitis: Secondary | ICD-10-CM

## 2021-01-28 MED ORDER — ONDANSETRON HCL 4 MG PO TABS: ORAL_TABLET | ORAL | 1 refills | Status: DC

## 2021-01-28 MED ORDER — ESZOPICLONE 2 MG PO TABS
2.0000 mg | ORAL_TABLET | Freq: Every evening | ORAL | 2 refills | Status: DC | PRN
Start: 2021-01-28 — End: 2021-04-12

## 2021-01-28 MED ORDER — TRIAMCINOLONE ACETONIDE 0.1 % EX CREA: TOPICAL_CREAM | CUTANEOUS | 3 refills | Status: DC

## 2021-02-04 ENCOUNTER — Other Ambulatory Visit: Payer: Self-pay

## 2021-02-04 MED ORDER — ONDANSETRON HCL 4 MG PO TABS: ORAL_TABLET | ORAL | 1 refills | Status: DC

## 2021-02-11 ENCOUNTER — Other Ambulatory Visit: Payer: Self-pay

## 2021-02-17 ENCOUNTER — Other Ambulatory Visit: Payer: Self-pay

## 2021-03-03 ENCOUNTER — Other Ambulatory Visit: Payer: Self-pay

## 2021-03-03 ENCOUNTER — Ambulatory Visit: Payer: Medicaid Other

## 2021-03-09 ENCOUNTER — Telehealth: Payer: Self-pay

## 2021-03-09 NOTE — Progress Notes (Signed)
Chronic Care Management Pharmacy Assistant   Name: VERDINE GRENFELL  MRN: [id number] DOB: [birth year]   Reason for Encounter: Medication Coordination for [facility]    Recent office visits:  01/22/21 Rochel Brome MD. Seen for routine visit. Started Eszopiclone 2 mg prn and Phentermine HCI 37.5 mg daily. Decreased Semaglutide from 4 mg to 2 mg weekly. D/C Metformin HCI 500 mg and Ropinirole HCI 3 mg. Notes stated D/C Trazodone and Start Lunesta 2 mg at night.   Recent consult visits:  None  Hospital visits:  None  Medications: Outpatient Encounter Medications as of 03/09/2021  Medication Sig   ASPIRIN 81 PO Take 81 mg by mouth daily.   atorvastatin (LIPITOR) 20 MG tablet TAKE ONE TABLET BY MOUTH ONCE DAILY   Blood Glucose Monitoring Suppl (ONETOUCH VERIO) w/Device KIT Use daily to check FBS   eszopiclone (LUNESTA) 2 MG TABS tablet Take 1 tablet (2 mg total) by mouth at bedtime as needed for sleep. Take immediately before bedtime   fluticasone (FLONASE) 50 MCG/ACT nasal spray 2 sprays each nostril once daliy   glucose blood (ONETOUCH VERIO) test strip E11.42 Use new test strip each time when checking FBS   Lancets (ONETOUCH DELICA PLUS [id number]) MISC E 11.42 Use new lancet each time when checking FBS   Multiple Vitamin (MULTIVITAMIN WITH MINERALS) TABS tablet Take 1 tablet by mouth daily.   ondansetron (ZOFRAN) 4 MG tablet TAKE ONE TABLET BY MOUTH every SIX hours AS NEEDED FOR FOR NAUSEA AND VOMITING   phentermine 37.5 MG capsule Take 1 capsule (37.5 mg total) by mouth every morning.   pregabalin (LYRICA) 100 MG capsule Take 1 capsule (100 mg total) by mouth 2 (two) times daily.   ramipril (ALTACE) 2.5 MG capsule Take 1 capsule (2.5 mg total) by mouth daily.   Semaglutide, 2 MG/DOSE, 8 MG/3ML SOPN Inject 2 mg as directed once a week.   solifenacin (VESICARE) 5 MG tablet Take 1 tablet (5 mg total) by mouth daily.   traZODone (DESYREL) 50 MG tablet TAKE 1/2 TO 1 TAB BY MOUTH AT  bedtime AS NEEDED FOR SLEEP   triamcinolone cream (KENALOG) 0.1 % APPLY 1 APPLICATION TOPICALLY TWICE A DAY. COVERAGE TERMINATED 6/30   vitamin B-12 1000 MCG tablet Take 1 tablet (1,000 mcg total) by mouth daily.   No facility-administered encounter medications on file as of 03/09/2021.    Reviewed chart for medication changes ahead of medication coordination call.  No OVs, Consults, or hospital visits since last care coordination call/Pharmacist visit. (If appropriate, list visit date, provider name)  No medication changes indicated OR if recent visit, treatment plan here.  BP Readings from Last 3 Encounters:  01/22/21 128/72  09/25/20 118/62  09/18/20 136/62    Lab Results  Component Value Date   HGBA1C 5.6 01/22/2021     Patient obtains medications through Vials  90 Days   Last adherence delivery included: Atorvastatin 20 mg 1 tablet daily   Dicylclomine 20 mg every 6 hours prn Fenofibrate 160 mg 1 tablet daily   Hydrochlorothiazide 25 mg 1 tablet daily Loratadine 10 mg 1 tablet daily   Omeprazole 20 mg 1 tablet daily Ondansetron 4 mg q8h prn nausea   Ozempic 1 mg/dose (38m/3mL) Inject in the skin once a week as directed Pregabalin 100 mg 1 capsule twice daily Ramipril 2.5 mg 1 tablet daily Ropinirole 3 mg 1 tablet daily   Solifenacin (Vesicare) 5 mg 1 tablet daily Trintellix 20 mg 1 tablet daily  Metformin 58m 1 tablet every morning  Trazadone 574m Onetouch test strips use as directed Onetouch Delica Lancets use as directed Amitriptyline 40 mg 1 tab at bedtime   Patient is due for next adherence delivery on: 03/18/21. Called patient and reviewed medications and coordinated delivery.  This delivery to include: Triamcinolone 0.10% apply topically twice daily Eszopiclone 1m77m tab at bedtime prn  Amitriptyline HCI 62m33mtab at bedtime Pregablin 100mg43map twice daily Ramipril 2.5mg 168mp daily Trintellix 20mg d31m Fenofibrate 160mg da35m Dicyclomine  20mg 1 e81m 6 hours prn  Atorvastatin 20mg dail66meprazole 20mg daily74mtouch verio test strips  Onetouch lancets Ondansetron HCI 4 mg 1 every 6 hours prn  Phentermine 37.5 mg daily  Semaglutide 1mg weekly 53matient declined the following: Metformin HCI 500mg DC by p29mder due to therapy change  HCTZ 25mg daily D/75m provider due to low pressure  Vesicare 5 mg- Pt reported not taking, provider is weaning her off  Flonase Nasal Spray has enough supply  Patient needs refills for- Sent refill request  Amitriptyline HCI 62mg 1 tab at 43mime Pregablin 100mg 1 cap twic54mily Ramipril 2.5mg 1 cap daily 15mntellix 20mg daily Fenofi77me 160mg daily  Dicycl87me 20mg 1 every 6 hour74mn  Omeprazole 20mg daily Onetouch 55mo test strips  Onetouch lancets  Confirmed delivery date of 03/18/21, advised patient that pharmacy will contact them the morning of delivery.  Danielle Gerringer, CElray McgregorciDenton  [phone]

## 2021-03-10 ENCOUNTER — Other Ambulatory Visit: Payer: Self-pay | Admitting: Family Medicine

## 2021-03-10 NOTE — Telephone Encounter (Signed)
Amitriptyline, HCTZ, and Fenofibrate were on patient's delivery but not Epic medslist. Coordinated with PCP who recommended I double check with patient. Patient stated, "I am not taking those anymore." Removed from medslist.  She stated she is still taking Omeprazole and Dicyclomine so updated Epic list there.

## 2021-03-11 ENCOUNTER — Other Ambulatory Visit: Payer: Self-pay

## 2021-03-11 ENCOUNTER — Ambulatory Visit (INDEPENDENT_AMBULATORY_CARE_PROVIDER_SITE_OTHER): Payer: Medicare Other

## 2021-03-11 VITALS — BP 128/80 | HR 77 | Resp 16 | Ht 63.0 in | Wt 203.0 lb

## 2021-03-11 DIAGNOSIS — Z0000 Encounter for general adult medical examination without abnormal findings: Secondary | ICD-10-CM | POA: Diagnosis not present

## 2021-03-11 DIAGNOSIS — Z23 Encounter for immunization: Secondary | ICD-10-CM

## 2021-03-11 DIAGNOSIS — E1142 Type 2 diabetes mellitus with diabetic polyneuropathy: Secondary | ICD-10-CM

## 2021-03-11 DIAGNOSIS — I10 Essential (primary) hypertension: Secondary | ICD-10-CM

## 2021-03-12 ENCOUNTER — Other Ambulatory Visit: Payer: Self-pay | Admitting: Physician Assistant

## 2021-03-12 ENCOUNTER — Other Ambulatory Visit: Payer: Self-pay | Admitting: Family Medicine

## 2021-03-12 DIAGNOSIS — F330 Major depressive disorder, recurrent, mild: Secondary | ICD-10-CM

## 2021-03-12 DIAGNOSIS — I10 Essential (primary) hypertension: Secondary | ICD-10-CM

## 2021-03-12 DIAGNOSIS — E1142 Type 2 diabetes mellitus with diabetic polyneuropathy: Secondary | ICD-10-CM

## 2021-03-12 MED ORDER — PHENTERMINE HCL 37.5 MG PO CAPS: 37.5000 mg | ORAL_CAPSULE | ORAL | 2 refills | Status: DC

## 2021-03-12 MED ORDER — ONETOUCH VERIO VI STRP: ORAL_STRIP | 2 refills | Status: DC

## 2021-03-12 MED ORDER — ONETOUCH DELICA PLUS LANCET33G MISC: 2 refills | Status: DC

## 2021-03-12 MED ORDER — DICYCLOMINE HCL 20 MG PO TABS: 20.0000 mg | ORAL_TABLET | Freq: Four times a day (QID) | ORAL | 0 refills | Status: DC

## 2021-03-12 MED ORDER — PREGABALIN 100 MG PO CAPS: 100.0000 mg | ORAL_CAPSULE | Freq: Two times a day (BID) | ORAL | 0 refills | Status: DC

## 2021-03-12 MED ORDER — OMEPRAZOLE 20 MG PO CPDR: 20.0000 mg | DELAYED_RELEASE_CAPSULE | Freq: Every day | ORAL | 0 refills | Status: DC

## 2021-03-12 NOTE — Telephone Encounter (Signed)
Called patient and spoke with her and she is not taking Amitriptilyne, ramipril, trintellix, or fenofibrate. Sending in other refills to pharmacy that are correct in patient's list of medications that she is currently taking.

## 2021-03-22 NOTE — Patient Instructions (Signed)

## 2021-03-22 NOTE — Progress Notes (Signed)
Subjective:   Hailey Greene is a 70 y.o. female who presents for Medicare Annual (Subsequent) preventive examination.  This wellness visit is conducted by a nurse.  The patient's medications were reviewed and reconciled since the patient's last visit.  History details were provided by the patient.  The history appears to be reliable.    Patient's last AWV was one year ago.   Medical History: Patient history and Family history was reviewed  Medications, Allergies, and preventative health maintenance was reviewed and updated.   Review of Systems    ROS-Negative Cardiac Risk Factors include: advanced age (>32mn, >>89women);diabetes mellitus;obesity (BMI >30kg/m2)     Objective:    Today's Vitals   03/11/21 1104  BP: 128/80  Pulse: 77  Resp: 16  SpO2: 94%  Weight: 203 lb (92.1 kg)  Height: '5\' 3"'  (1.6 m)  PainSc: 0-No pain   Body mass index is 35.96 kg/m.  Advanced Directives 03/10/2020 08/09/2019 08/08/2019 07/19/2019 07/15/2019 05/30/2019 01/26/2019  Does Patient Have a Medical Advance Directive? Yes No No No No Yes Yes  Type of Advance Directive - - - - - HPress photographerOut of facility DNR (pink MOST or yellow form) Living will  Does patient want to make changes to medical advance directive? - - - - - - No - Patient declined  Would patient like information on creating a medical advance directive? - No - Patient declined - No - Patient declined No - Patient declined - No - Patient declined    Current Medications (verified) Outpatient Encounter Medications as of 03/11/2021  Medication Sig   ASPIRIN 81 PO Take 81 mg by mouth daily.   atorvastatin (LIPITOR) 20 MG tablet TAKE ONE TABLET BY MOUTH ONCE DAILY   Blood Glucose Monitoring Suppl (ONETOUCH VERIO) w/Device KIT Use daily to check FBS   eszopiclone (LUNESTA) 2 MG TABS tablet Take 1 tablet (2 mg total) by mouth at bedtime as needed for sleep. Take immediately before bedtime   fluticasone (FLONASE) 50 MCG/ACT  nasal spray 2 sprays each nostril once daliy   glucose blood (ONETOUCH VERIO) test strip E11.42 Use new test strip each time when checking FBS   Lancets (ONETOUCH DELICA PLUS [id number] MISC E 11.42 Use new lancet each time when checking FBS   Multiple Vitamin (MULTIVITAMIN WITH MINERALS) TABS tablet Take 1 tablet by mouth daily.   omeprazole (PRILOSEC) 20 MG capsule Take 1 capsule (20 mg total) by mouth daily.   ondansetron (ZOFRAN) 4 MG tablet TAKE ONE TABLET BY MOUTH every SIX hours AS NEEDED FOR FOR NAUSEA AND VOMITING   phentermine (ADIPEX-P) 37.5 MG tablet Take 37.5 mg by mouth every morning.   phentermine 37.5 MG capsule Take 1 capsule (37.5 mg total) by mouth every morning.   pregabalin (LYRICA) 100 MG capsule Take 1 capsule (100 mg total) by mouth 2 (two) times daily.   rOPINIRole (REQUIP) 3 MG tablet Take 3 mg by mouth at bedtime.   Semaglutide, 2 MG/DOSE, 8 MG/3ML SOPN Inject 2 mg as directed once a week.   solifenacin (VESICARE) 5 MG tablet Take 1 tablet (5 mg total) by mouth daily.   traZODone (DESYREL) 50 MG tablet TAKE 1/2 TO 1 TAB BY MOUTH AT bedtime AS NEEDED FOR SLEEP   triamcinolone cream (KENALOG) 0.1 % APPLY 1 APPLICATION TOPICALLY TWICE A DAY. COVERAGE TERMINATED 6/30   vitamin B-12 1000 MCG tablet Take 1 tablet (1,000 mcg total) by mouth daily.   [DISCONTINUED] dicyclomine (BENTYL) 20 MG  tablet Take 20 mg by mouth every 6 (six) hours. PRN   [DISCONTINUED] glucose blood (ONETOUCH VERIO) test strip E11.42 Use new test strip each time when checking FBS   [DISCONTINUED] Lancets (ONETOUCH DELICA PLUS [id number]) MISC E 11.42 Use new lancet each time when checking FBS   [DISCONTINUED] omeprazole (PRILOSEC) 20 MG capsule Take 20 mg by mouth daily.   [DISCONTINUED] phentermine 37.5 MG capsule Take 1 capsule (37.5 mg total) by mouth every morning.   [DISCONTINUED] pregabalin (LYRICA) 100 MG capsule Take 1 capsule (100 mg total) by mouth 2 (two) times daily.   [DISCONTINUED] ramipril  (ALTACE) 2.5 MG capsule Take 1 capsule (2.5 mg total) by mouth daily.   No facility-administered encounter medications on file as of 03/11/2021.    Allergies (verified) Patient has no allergy information on record.   History: Past Medical History:  Diagnosis Date   Acute hypoxemic respiratory failure due to COVID-19 (HCC)    Chicken pox    Depression    Diabetes (HCC)    GERD (gastroesophageal reflux disease)    Hypertension    Idiopathic progressive neuropathy    Major depressive disorder    Mixed hyperlipidemia    Neuropathy    Osteoarthritis    Pneumonia    Primary insomnia    RLS (restless legs syndrome)    Tachycardia    Urge incontinence    UTI (lower urinary tract infection)    Weakness    Past Surgical History:  Procedure Laterality Date   ABDOMINAL HYSTERECTOMY     APPENDECTOMY     CHOLECYSTECTOMY     HERNIA REPAIR     REPLACEMENT TOTAL KNEE Right    TONSILLECTOMY     TOTAL KNEE ARTHROPLASTY Left 07/15/2019   Procedure: TOTAL KNEE ARTHROPLASTY;  Surgeon: Vickey Huger, MD;  Location: [facility];  Service: Orthopedics;  Laterality: Left;   Family History  Problem Relation Age of Onset   Thyroid disease Mother    Cancer Mother    Migraines Mother    Brain cancer Father    Heart disease Maternal Grandmother    Diabetes Daughter    Social History   Socioeconomic History   Marital status: Married    Spouse name: Not on file   Number of children: 3   Years of education: 12   Highest education level: Not on file  Occupational History   Occupation: Housewife  Tobacco Use   Smoking status: Former    Packs/day: 2.00    Years: 12.00    Pack years: 24.00    Types: Cigarettes    Quit date: 05/25/2010    Years since quitting: 10.8   Smokeless tobacco: Never  Vaping Use   Vaping Use: Never used  Substance and Sexual Activity   Alcohol use: Yes    Comment: occasional   Drug use: No   Sexual activity: Not on file  Other Topics Concern   Not on file   Social History Narrative   Born and raised in [place], [place]. Currently lives in a house with her husband. 1 dog. Fun: Garden, feed birds, swimming   Denies any religious beliefs effecting health care.    Social Determinants of Health   Financial Resource Strain: Not on file  Food Insecurity: Not on file  Transportation Needs: Not on file  Physical Activity: Not on file  Stress: Not on file  Social Connections: Not on file    Tobacco Counseling Counseling given: Not Answered   Clinical Intake:  Pre-visit preparation  completed: Yes  Pain : No/denies pain Pain Score: 0-No pain     BMI - recorded: 35.96 Nutritional Status: BMI > 30  Obese Nutritional Risks: None Diabetes: Yes CBG done?: No Did pt. bring in CBG monitor from home?: No  How often do you need to have someone help you when you read instructions, pamphlets, or other written materials from your doctor or pharmacy?: 1 - Never  Interpreter Needed?: No      Activities of Daily Living In your present state of health, do you have any difficulty performing the following activities: 03/22/2021  Hearing? N  Vision? N  Difficulty concentrating or making decisions? N  Walking or climbing stairs? N  Dressing or bathing? N  Doing errands, shopping? N  Preparing Food and eating ? N  Using the Toilet? N  In the past six months, have you accidently leaked urine? N  Do you have problems with loss of bowel control? N  Managing your Medications? N  Managing your Finances? N  Housekeeping or managing your Housekeeping? N  Some recent data might be hidden    Patient Care Team: Rochel Brome, MD as PCP - General (Family Medicine) Vickey Huger, MD as Consulting Physician (Orthopedic Surgery)  Indicate any recent Medical Services you may have received from other than [facility] providers in the past year (date may be approximate).     Assessment:   This is a routine wellness examination for Janelie.  Hearing/Vision  screen No results found.  Dietary issues and exercise activities discussed: Current Exercise Habits: The patient does not participate in regular exercise at present, Exercise limited by: None identified   Goals Addressed   None    Depression Screen PHQ 2/9 Scores 03/22/2021 01/22/2021 06/24/2020 03/10/2020 12/22/2019 08/26/2019  PHQ - 2 Score 0 0 '5 4 3 5  ' PHQ- 9 Score - - '13 14 21 21    ' Fall Risk Fall Risk  03/22/2021 01/22/2021 06/24/2020 03/10/2020 09/12/2019  Falls in the past year? 0 0 1 1 -  Number falls in past yr: 0 0 '1 1 1  ' Injury with Fall? 0 0 0 0 -  Risk for fall due to : No Fall Risks - History of fall(s) Other (Comment) -  Risk for fall due to: Comment - - - doesn't know why; maybe knees -  Follow up Falls evaluation completed;Falls prevention discussed Falls evaluation completed Falls prevention discussed - -    FALL RISK PREVENTION PERTAINING TO THE HOME:  Home free of loose throw rugs in walkways, pet beds, electrical cords, etc? No  Adequate lighting in your home to reduce risk of falls? Yes   ASSISTIVE DEVICES UTILIZED TO PREVENT FALLS:  Life alert? No  Use of a cane, walker or w/c? No  Grab bars in the bathroom? No  Shower chair or bench in shower? No  Elevated toilet seat or a handicapped toilet? No    Cognitive Function:     6CIT Screen 03/22/2021 03/10/2020  What Year? 0 points 0 points  What month? 0 points 0 points  What time? 0 points 0 points  Count back from 20 0 points 0 points  Months in reverse 0 points 0 points  Repeat phrase 0 points 0 points  Total Score 0 0    Immunizations Immunization History  Administered Date(s) Administered   Fluad Quad(high Dose 65+) 12/19/2019, 12/17/2020   Influenza-Unspecified 01/17/2018, 12/14/2018   PFIZER(Purple Top)SARS-COV-2 Vaccination 05/27/2019, 06/17/2019, 12/19/2019   Pfizer Covid-19 Vaccine Bivalent Booster 48yr &  up 12/17/2020   Pneumococcal Conjugate-13 04/06/2017   Pneumococcal  Polysaccharide-23 03/11/2021   Tdap 03/04/2019   Zoster Recombinat (Shingrix) 08/02/2017, 04/04/2018    TDAP status: Up to date  Flu Vaccine status: Up to date  Pneumococcal vaccine status: Completed during today's visit.  Qualifies for Shingles Vaccine? Yes   Zostavax completed No   Shingrix Completed?: Yes  Screening Tests Health Maintenance  Topic Date Due   OPHTHALMOLOGY EXAM  Never done   COLONOSCOPY (Pts 45-71yr Insurance coverage will need to be confirmed)  Never done   HEMOGLOBIN A1C  07/23/2021   URINE MICROALBUMIN  09/25/2021   FOOT EXAM  01/22/2022   MAMMOGRAM  10/20/2022   TETANUS/TDAP  03/03/2029   Pneumonia Vaccine 70 Years old  Completed   INFLUENZA VACCINE  Completed   DEXA SCAN  Completed   COVID-19 Vaccine  Completed   Hepatitis C Screening  Completed   Zoster Vaccines- Shingrix  Completed   HPV VACCINES  Aged Out    Health Maintenance  Health Maintenance Due  Topic Date Due   OPHTHALMOLOGY EXAM  Never done   COLONOSCOPY (Pts 45-465yrInsurance coverage will need to be confirmed)  Never done    Colorectal cancer screening: Type of screening[place]  Mammogram status: Completed 09/2020. Repeat every year  Lung Cancer Screening: (Low Dose CT Chest recommended if Age 69-80ears, 30 pack-year currently smoking OR have quit w/in 15years.) does not qualify.    Additional Screening:  Vision Screening: Recommended annual ophthalmology exams for early detection of glaucoma and other disorders of the eye. Is the patient up to date with their annual eye exam?  Yes  Who is the provider or what is the name of the office in which the patient attends annual eye exams? [facility]: Recommended annual dental exams for proper oral hygiene     Plan:    1- Eye exam scheduled for February 2[place] reviewed and noted the  following in the patients chart:   Medical and social history Use of alcohol, tobacco or illicit drugs  Current medications and supplements including opioid prescriptions.  Functional ability and status Nutritional status Physical activity Advanced directives List of other physicians Hospitalizations, surgeries, and ER visits in previous 12 months Vitals Screenings to include cognitive, depression, and falls Referrals and appointments  In addition, I have reviewed and discussed with patient certain preventive protocols, quality metrics, and best practice recommendations. A written personalized care plan for preventive services as well as general preventive health recommendations were provided to patient.     KiErie NoeLPN   [id number]

## 2021-03-23 ENCOUNTER — Other Ambulatory Visit: Payer: Self-pay

## 2021-04-06 ENCOUNTER — Telehealth: Payer: Self-pay

## 2021-04-06 NOTE — Telephone Encounter (Signed)
Patient left message requesting a call back. Attempted to call on cell number- no voicemail available. Home number "call could not be completed".

## 2021-04-06 NOTE — Telephone Encounter (Signed)
Patient left voicemail requesting a antibiotic and cough syrup stating she has a chest cold. Attempted to contact patient to schedule appt however unable to leave message.

## 2021-04-07 DIAGNOSIS — J324 Chronic pansinusitis: Secondary | ICD-10-CM | POA: Diagnosis not present

## 2021-04-07 DIAGNOSIS — R051 Acute cough: Secondary | ICD-10-CM | POA: Diagnosis not present

## 2021-04-07 DIAGNOSIS — J069 Acute upper respiratory infection, unspecified: Secondary | ICD-10-CM | POA: Diagnosis not present

## 2021-04-08 ENCOUNTER — Telehealth: Payer: Self-pay

## 2021-04-08 NOTE — Chronic Care Management (AMB) (Signed)
Chronic Care Management Pharmacy Assistant   Name: Hailey Greene  MRN: [id number] DOB: 1950-07-14   Reason for Encounter: Medication Coordination for [facility]    Recent office visits:  03/12/21 Rochel Brome MD. Orders Only. D/C Ramipril 2.5 mg and Dicyclomine 20 mg. Reordered Phentermine 37.5 mg.  03/11/21 Rhae Hammock LPN. Annual Wellness Visit. No med changes.   Recent consult visits:  None  Hospital visits:  None   Medications: Outpatient Encounter Medications as of 04/08/2021  Medication Sig   ASPIRIN 81 PO Take 81 mg by mouth daily.   atorvastatin (LIPITOR) 20 MG tablet TAKE ONE TABLET BY MOUTH ONCE DAILY   Blood Glucose Monitoring Suppl (ONETOUCH VERIO) w/Device KIT Use daily to check FBS   eszopiclone (LUNESTA) 2 MG TABS tablet Take 1 tablet (2 mg total) by mouth at bedtime as needed for sleep. Take immediately before bedtime   fluticasone (FLONASE) 50 MCG/ACT nasal spray 2 sprays each nostril once daliy   glucose blood (ONETOUCH VERIO) test strip E11.42 Use new test strip each time when checking FBS   Lancets (ONETOUCH DELICA PLUS [id number]) MISC E 11.42 Use new lancet each time when checking FBS   Multiple Vitamin (MULTIVITAMIN WITH MINERALS) TABS tablet Take 1 tablet by mouth daily.   omeprazole (PRILOSEC) 20 MG capsule Take 1 capsule (20 mg total) by mouth daily.   ondansetron (ZOFRAN) 4 MG tablet TAKE ONE TABLET BY MOUTH every SIX hours AS NEEDED FOR FOR NAUSEA AND VOMITING   phentermine (ADIPEX-P) 37.5 MG tablet Take 37.5 mg by mouth every morning.   phentermine 37.5 MG capsule Take 1 capsule (37.5 mg total) by mouth every morning.   pregabalin (LYRICA) 100 MG capsule Take 1 capsule (100 mg total) by mouth 2 (two) times daily.   rOPINIRole (REQUIP) 3 MG tablet Take 3 mg by mouth at bedtime.   Semaglutide, 2 MG/DOSE, 8 MG/3ML SOPN Inject 2 mg as directed once a week.   solifenacin (VESICARE) 5 MG tablet Take 1 tablet (5 mg total) by mouth daily.   traZODone  (DESYREL) 50 MG tablet TAKE 1/2 TO 1 TAB BY MOUTH AT bedtime AS NEEDED FOR SLEEP   triamcinolone cream (KENALOG) 0.1 % APPLY 1 APPLICATION TOPICALLY TWICE A DAY. COVERAGE TERMINATED 6/30   vitamin B-12 1000 MCG tablet Take 1 tablet (1,000 mcg total) by mouth daily.   No facility-administered encounter medications on file as of 04/08/2021.   Reviewed chart for medication changes ahead of medication coordination call.  No  Consults, or hospital visits since last care coordination call/Pharmacist visit.  BP Readings from Last 3 Encounters:  03/11/21 128/80  01/22/21 128/72  09/25/20 118/62    Lab Results  Component Value Date   HGBA1C 5.6 01/22/2021     Patient obtains medications through Adherence Packaging  30 Days   Last adherence delivery included:  Triamcinolone 0.10% apply topically twice daily Eszopiclone 38m 1 tab at bedtime prn  Amitriptyline HCI 578m1 tab at bedtime Pregablin 10031m cap twice daily Ramipril 2.5mg26mcap daily Trintellix 20mg19mly Fenofibrate 160mg 2my  Dicyclomine 20mg 160mry 6 hours prn  Atorvastatin 20mg da46mOmeprazole 20mg dai43mnetouch verio test strips  Onetouch lancets Ondansetron HCI 4 mg 1 every 6 hours prn  Phentermine 37.5 mg daily  Semaglutide 2mg weekl43m Patient declined (meds) last month due  Metformin HCI 500mg DC by94mvider due to therapy change  HCTZ 25mg daily 74mby provider due to low pressure  Vesicare 5 mg- Pt reported not taking, provider is weaning her off  Flonase Nasal Spray has enough supply  Patient is due for next adherence delivery on: 04/19/21. Called patient and reviewed medications and coordinated delivery.  This delivery to include: Triamcinolone 0.10% apply topically twice daily Eszopiclone 25m 1 tab at bedtime prn  Phentermine 37.5 mg daily   Note from pharmacy:  Atorvastatin and pregabalin set to future fill for 04/27/21 since they were too soon to fill last delivery to be partial to resync to  other meds. Most cycle meds not due until 06/10/21 but these were 30DS meds  Patient declined the following medications  None   Patient needs refills for Eszopiclone 287m1 tab at bedtime prn   Confirmed delivery date of 04/19/21, advised patient that pharmacy will contact them the [place] delivery.

## 2021-04-09 ENCOUNTER — Telehealth: Payer: Self-pay

## 2021-04-09 NOTE — Telephone Encounter (Signed)
Patient calling as she went to urgent care Wednesday in [place]. She was given azithromycin for 4 days and promethazine PRN. Pt feels the promethazine is not helping cough. Cough is main complaint. Requesting different cough medication if possible. If this can be sent she requests this be [facility] to [facility].   Lorita Officer, [facility] 04/09/21 9:14 AM

## 2021-04-09 NOTE — Telephone Encounter (Signed)
Unable to reach pt to schedule for Monday.   Lorita Officer, CCMA 04/09/21 12:00 PM

## 2021-04-09 NOTE — Telephone Encounter (Signed)
Compliant on meds, Dc'd Ramapril from [facility] medslist per note from 03/12/22

## 2021-04-12 ENCOUNTER — Other Ambulatory Visit: Payer: Self-pay | Admitting: Family Medicine

## 2021-04-14 ENCOUNTER — Ambulatory Visit: Payer: Medicaid Other

## 2021-04-21 ENCOUNTER — Other Ambulatory Visit: Payer: Self-pay | Admitting: Family Medicine

## 2021-04-21 DIAGNOSIS — E782 Mixed hyperlipidemia: Secondary | ICD-10-CM

## 2021-04-25 ENCOUNTER — Other Ambulatory Visit: Payer: Self-pay | Admitting: Family Medicine

## 2021-04-28 ENCOUNTER — Ambulatory Visit: Payer: Medicaid Other | Admitting: Family Medicine

## 2021-05-05 ENCOUNTER — Other Ambulatory Visit: Payer: Self-pay | Admitting: Family Medicine

## 2021-05-05 ENCOUNTER — Other Ambulatory Visit: Payer: Self-pay

## 2021-05-05 ENCOUNTER — Telehealth: Payer: Self-pay

## 2021-05-05 DIAGNOSIS — L2089 Other atopic dermatitis: Secondary | ICD-10-CM

## 2021-05-05 MED ORDER — TRAZODONE HCL 50 MG PO TABS: ORAL_TABLET | ORAL | 1 refills | Status: DC

## 2021-05-05 NOTE — Chronic Care Management (AMB) (Signed)
05/05/2021- Patient called [facility], out of Trazodone prescription, no refills on file. Sent urgent request to Clinical team for refills to be sent to [facility] for today's delivery.  Pattricia Boss, [place] Pharmacist Assistant [phone]

## 2021-05-06 ENCOUNTER — Telehealth: Payer: Self-pay

## 2021-05-06 NOTE — Chronic Care Management (AMB) (Signed)
Chronic Care Management Pharmacy Assistant   Name: MAKAIYAH SCHWEIGER  MRN: [id number] DOB: 1950/10/16   Reason for Encounter: Medication Coordination for [facility]    Recent office visits:  None  Recent consult visits:  None  Hospital visits:  None  Medications: Outpatient Encounter Medications as of 05/06/2021  Medication Sig   ASPIRIN 81 PO Take 81 mg by mouth daily.   atorvastatin (LIPITOR) 20 MG tablet TAKE 1 TABLET BY MOUTH ONCE  DAILY   Blood Glucose Monitoring Suppl (ONETOUCH VERIO) w/Device KIT Use daily to check FBS   eszopiclone (LUNESTA) 2 MG TABS tablet TAKE ONE TABLET BY MOUTH AT BEDTIME AS NEEDED FOR SLEEP. take immediately BEFORE bedtime   fluticasone (FLONASE) 50 MCG/ACT nasal spray 2 sprays each nostril once daliy   glucose blood (ONETOUCH VERIO) test strip E11.42 Use new test strip each time when checking FBS   Lancets (ONETOUCH DELICA PLUS [id number]) MISC E 11.42 Use new lancet each time when checking FBS   Multiple Vitamin (MULTIVITAMIN WITH MINERALS) TABS tablet Take 1 tablet by mouth daily.   omeprazole (PRILOSEC) 20 MG capsule Take 1 capsule (20 mg total) by mouth daily.   ondansetron (ZOFRAN) 4 MG tablet TAKE ONE TABLET BY MOUTH every SIX hours AS NEEDED FOR FOR NAUSEA AND VOMITING   phentermine 37.5 MG capsule Take 1 capsule (37.5 mg total) by mouth every morning.   pregabalin (LYRICA) 100 MG capsule Take 1 capsule (100 mg total) by mouth 2 (two) times daily.   rOPINIRole (REQUIP) 3 MG tablet TAKE 1 TABLET BY MOUTH ONCE DAILY 1-3 HOURS BEFORE  BEDTIME   Semaglutide, 2 MG/DOSE, 8 MG/3ML SOPN Inject 2 mg as directed once a week.   solifenacin (VESICARE) 5 MG tablet Take 1 tablet (5 mg total) by mouth daily.   traZODone (DESYREL) 50 MG tablet TAKE 1/2 TO 1 TAB BY MOUTH AT bedtime AS NEEDED FOR SLEEP   triamcinolone cream (KENALOG) 0.1 % APPLY ONE application TOPICALLY TWICE DAILY   vitamin B-12 1000 MCG tablet Take 1 tablet (1,000 mcg total) by mouth daily.    No facility-administered encounter medications on file as of 05/06/2021.   Reviewed chart for medication changes ahead of medication coordination call.  No OVs, Consults, or hospital visits since last care coordination call/Pharmacist visit.   No medication changes indicated OR if recent visit, treatment plan here.  BP Readings from Last 3 Encounters:  03/11/21 128/80  01/22/21 128/72  09/25/20 118/62    Lab Results  Component Value Date   HGBA1C 5.6 01/22/2021     Patient obtains medications through Vials  30 Days   Last adherence delivery included:  Triamcinolone 0.10% apply topically twice daily Eszopiclone 53m 1 tab at bedtime prn  Phentermine 37.5 mg daily   Patient declined (meds) last month  None  Patient is due for next adherence delivery on: 05/18/21. Called patient and reviewed medications and coordinated delivery.  This delivery to include: Triamcinolone 0.10% apply topically twice daily Eszopiclone 250m1 tab at bedtime prn  Phentermine 37.5 mg daily  Zofran 4m40mTake one tablet by mouth every 6 hrs prn Ozempic 2mg50mnject 2mg 48me a week  Vitamin B12 1000mcg56mke 1 daily  Vesicare 5mg- T82m 1 daily  Ropinirole 3mg- Ta37m1 by mouth once daily Topiramate 25mg-Tak20mtablet (25 mg total) by mouth at bedtime for 7 days, THEN 2 tablets (50 mg total) at bedtime for 23 days.  Patient request the following to be filled  but no RX in chart. Will send Dr. Tobie Poet a message asking if she wants pt to continue taking or not and if so these will be added in delivery.  Dicylocomine 92m- Every 6 hrs prn  Omeprazole 270m Daily  Trintellix 2025moratadine 77m4mmbicort Inhaler 160mc19mmipril 2.5mg F5mfibrate 200mg  70mtated she wants to stay with us and [place] a 90 day supply on her meds. She no longer uses any other pharmacy.   Patient declined the following medications  Flonase - Has enough supply, does not need Amitriptyline- D/C by provider due to weight  loss [place]  Just fill 04/30/21 Trazodone 50mg- F18md on 05/06/21 90ds Atorvastatin 20mg  Fi87m on 04/27/21 51 days  Patient needs refills - Message has been sent  Phentermine 37.5 mg Ozempic 2mg Vitam7mB12 1000mcg Vesi31m 5mg Ropinir56m 3mg Topirama76m25mg Dicyloco31m 20mg  Omeprazo12m0mg Trintellix64mg Loratadine 51m Symbicort In7mr 160mcg Ramipril 2.575menofibrate 20046m Confirmed deli74m date of 05/18/21, advised patient that pharmacy will contact them the morning of delivery.  Danielle Gerringer, CElray McgregorciDISH  [phone]

## 2021-05-07 ENCOUNTER — Other Ambulatory Visit: Payer: Self-pay

## 2021-05-07 ENCOUNTER — Ambulatory Visit (INDEPENDENT_AMBULATORY_CARE_PROVIDER_SITE_OTHER): Payer: Medicare Other | Admitting: Family Medicine

## 2021-05-07 VITALS — BP 120/60 | HR 72 | Temp 97.1°F | Resp 16 | Ht 62.0 in | Wt 200.4 lb

## 2021-05-07 DIAGNOSIS — G603 Idiopathic progressive neuropathy: Secondary | ICD-10-CM

## 2021-05-07 DIAGNOSIS — E1142 Type 2 diabetes mellitus with diabetic polyneuropathy: Secondary | ICD-10-CM

## 2021-05-07 DIAGNOSIS — F330 Major depressive disorder, recurrent, mild: Secondary | ICD-10-CM | POA: Diagnosis not present

## 2021-05-07 DIAGNOSIS — I152 Hypertension secondary to endocrine disorders: Secondary | ICD-10-CM

## 2021-05-07 DIAGNOSIS — E1159 Type 2 diabetes mellitus with other circulatory complications: Secondary | ICD-10-CM | POA: Diagnosis not present

## 2021-05-07 DIAGNOSIS — I10 Essential (primary) hypertension: Secondary | ICD-10-CM | POA: Diagnosis not present

## 2021-05-07 DIAGNOSIS — F5101 Primary insomnia: Secondary | ICD-10-CM

## 2021-05-07 DIAGNOSIS — E782 Mixed hyperlipidemia: Secondary | ICD-10-CM | POA: Diagnosis not present

## 2021-05-07 DIAGNOSIS — Z6836 Body mass index (BMI) 36.0-36.9, adult: Secondary | ICD-10-CM

## 2021-05-07 DIAGNOSIS — G2581 Restless legs syndrome: Secondary | ICD-10-CM

## 2021-05-07 MED ORDER — TOPIRAMATE 25 MG PO TABS: ORAL_TABLET | ORAL | 0 refills | Status: DC

## 2021-05-07 MED ORDER — PHENTERMINE HCL 15 MG PO CAPS: 15.0000 mg | ORAL_CAPSULE | ORAL | 2 refills | Status: DC

## 2021-05-07 NOTE — Progress Notes (Signed)
Subjective:  Patient ID: Hailey Greene, female    DOB: 01-24-1951  Age: 71 y.o. MRN: [id number]  Chief Complaint  Patient presents with   Diabetes   Hyperlipidemia   Knee Pain    HPI Diabetes: Patient was taking Ozempic 2 mg weekly. Patient checks her blood sugars 113-121. Last A1C 5.6%.  Hypertension: She takes Aspirin 81 mg daily. No bp medicines.   Hyperlipidemia: Currently taking atorvastatin 20 mg daily.  RLS: takes requip 3 mg once daily at night. Intermittently bothers her. Only takes trazodone sporadically. Perhaps 3 times per month. Taking B12, MVI, and   No symptoms of sleep apnea.   Idiopathic Neuropathy: Dxd prior to having diabetes. Lyrica 100 mg one twice a day. Previously took amitriptyline. I have weaned her down or off several meds.   GERD: stopped taking omeprazole. Has changed her diet.   Urge incontinence: vesicare 5 mg once daily.   Current Outpatient Medications on File Prior to Visit  Medication Sig Dispense Refill   ASPIRIN 81 PO Take 81 mg by mouth daily.     atorvastatin (LIPITOR) 20 MG tablet TAKE 1 TABLET BY MOUTH ONCE  DAILY 90 tablet 1   Blood Glucose Monitoring Suppl (ONETOUCH VERIO) w/Device KIT Use daily to check FBS 1 kit 0   eszopiclone (LUNESTA) 2 MG TABS tablet TAKE ONE TABLET BY MOUTH AT BEDTIME AS NEEDED FOR SLEEP. take immediately BEFORE bedtime 30 tablet 2   fluticasone (FLONASE) 50 MCG/ACT nasal spray 2 sprays each nostril once daliy 48 mL 2   glucose blood (ONETOUCH VERIO) test strip E11.42 Use new test strip each time when checking FBS 100 each 2   Lancets (ONETOUCH DELICA PLUS [id number]) MISC E 11.42 Use new lancet each time when checking FBS 100 each 2   Multiple Vitamin (MULTIVITAMIN WITH MINERALS) TABS tablet Take 1 tablet by mouth daily.     ondansetron (ZOFRAN) 4 MG tablet TAKE ONE TABLET BY MOUTH every SIX hours AS NEEDED FOR FOR NAUSEA AND VOMITING 90 tablet 1   pregabalin (LYRICA) 100 MG capsule Take 1 capsule (100 mg  total) by mouth 2 (two) times daily. 180 capsule 0   rOPINIRole (REQUIP) 3 MG tablet TAKE 1 TABLET BY MOUTH ONCE DAILY 1-3 HOURS BEFORE  BEDTIME 90 tablet 0   Semaglutide, 2 MG/DOSE, 8 MG/3ML SOPN Inject 2 mg as directed once a week. 9 mL 0   solifenacin (VESICARE) 5 MG tablet Take 1 tablet (5 mg total) by mouth daily. 90 tablet 1   traZODone (DESYREL) 50 MG tablet TAKE 1/2 TO 1 TAB BY MOUTH AT bedtime AS NEEDED FOR SLEEP 90 tablet 1   triamcinolone cream (KENALOG) 0.1 % APPLY ONE application TOPICALLY TWICE DAILY 80 g 3   vitamin B-12 1000 MCG tablet Take 1 tablet (1,000 mcg total) by mouth daily. 30 tablet 0   No current facility-administered medications on file prior to visit.   Past Medical History:  Diagnosis Date   2019 novel coronavirus disease (COVID-19) 12/31/2018   Acute hypoxemic respiratory failure due to COVID-19 [facility])    Chicken pox    Depression    Diabetes (HCC)    GERD (gastroesophageal reflux disease)    Hypertension    Idiopathic progressive neuropathy    Major depressive disorder    Mixed hyperlipidemia    Neuropathy    Osteoarthritis    Pneumonia    Primary insomnia    Primary osteoarthritis of left knee 05/31/2019   RLS (restless legs  syndrome)    S/P total knee replacement 07/15/2019   Status post total knee replacement 07/25/2019   Tachycardia    Urge incontinence    UTI (lower urinary tract infection)    Weakness    Past Surgical History:  Procedure Laterality Date   ABDOMINAL HYSTERECTOMY     APPENDECTOMY     CHOLECYSTECTOMY     HERNIA REPAIR     REPLACEMENT TOTAL KNEE Right    TONSILLECTOMY     TOTAL KNEE ARTHROPLASTY Left 07/15/2019   Procedure: TOTAL KNEE ARTHROPLASTY;  Surgeon: Vickey Huger, MD;  Location: [facility];  Service: Orthopedics;  Laterality: Left;    Family History  Problem Relation Age of Onset   Thyroid disease Mother    Cancer Mother    Migraines Mother    Brain cancer Father    Heart disease Maternal Grandmother    Diabetes  Daughter    Social History   Socioeconomic History   Marital status: Married    Spouse name: Not on file   Number of children: 3   Years of education: 12   Highest education level: Not on file  Occupational History   Occupation: Housewife  Tobacco Use   Smoking status: Former    Packs/day: 2.00    Years: 12.00    Pack years: 24.00    Types: Cigarettes    Quit date: 05/25/2010    Years since quitting: 10.9   Smokeless tobacco: Never  Vaping Use   Vaping Use: Never used  Substance and Sexual Activity   Alcohol use: Yes    Comment: occasional   Drug use: No   Sexual activity: Not on file  Other Topics Concern   Not on file  Social History Narrative   Born and raised in [place], [place]. Currently lives in a house with her husband. 1 dog. Fun: Garden, feed birds, swimming   Denies any religious beliefs effecting health care.    Social Determinants of Health   Financial Resource Strain: Not on file  Food Insecurity: Not on file  Transportation Needs: Not on file  Physical Activity: Not on file  Stress: Not on file  Social Connections: Not on file    Review of Systems  Constitutional:  Negative for chills, fatigue and fever.  HENT:  Negative for congestion, rhinorrhea and sore throat.   Eyes:  Positive for visual disturbance.  Respiratory:  Negative for cough and shortness of breath.   Cardiovascular:  Negative for chest pain.  Gastrointestinal:  Negative for abdominal pain, constipation, diarrhea, nausea and vomiting.  Genitourinary:  Negative for dysuria and urgency.  Musculoskeletal:  Positive for arthralgias (bilateral knee pain and right hip pain). Negative for back pain and myalgias.  Neurological:  Negative for dizziness, weakness, light-headedness and headaches.  Psychiatric/Behavioral:  Negative for dysphoric mood. The patient is not nervous/anxious.     Objective:  BP 120/60    Pulse 72    Temp (!) 97.1 F (36.2 C)    Resp 16    Ht '5\' 2"'  (1.575 m)    Wt 200 lb  6.4 oz (90.9 kg)    BMI 36.65 kg/m   BP/Weight 05/07/2021 03/11/2021 36/62/9476  Systolic BP 546 503 546  Diastolic BP 60 80 72  Wt. (Lbs) 200.4 203 204  BMI 36.65 35.96 36.14    Physical Exam Vitals reviewed.  Constitutional:      Appearance: Normal appearance. She is normal weight.  Neck:     Vascular: No carotid bruit.  Cardiovascular:     Rate and Rhythm: Normal rate and regular rhythm.     Heart sounds: Normal heart sounds.  Pulmonary:     Effort: Pulmonary effort is normal. No respiratory distress.     Breath sounds: Normal breath sounds.  Abdominal:     General: Abdomen is flat. Bowel sounds are normal.     Palpations: Abdomen is soft.     Tenderness: There is no abdominal tenderness.  Neurological:     Mental Status: She is alert and oriented to person, place, and time.  Psychiatric:        Mood and Affect: Mood normal.        Behavior: Behavior normal.    Diabetic Foot Exam - Simple   Simple Foot Form  05/07/2021 12:09 AM  Visual Inspection No deformities, no ulcerations, no other skin breakdown bilaterally: Yes Sensation Testing See comments: Yes Pulse Check Posterior Tibialis and Dorsalis pulse intact bilaterally: Yes Comments Feet numb BL and into legs.      Lab Results  Component Value Date   WBC 8.7 05/07/2021   HGB 14.7 05/07/2021   HCT 44.6 05/07/2021   PLT 285 05/07/2021   GLUCOSE 97 05/07/2021   CHOL 250 (H) 05/07/2021   TRIG 227 (H) 05/07/2021   HDL 52 05/07/2021   LDLCALC 156 (H) 05/07/2021   ALT 8 05/07/2021   AST 15 05/07/2021   NA 139 05/07/2021   K 5.3 (H) 05/07/2021   CL 101 05/07/2021   CREATININE 0.85 05/07/2021   BUN 15 05/07/2021   CO2 25 05/07/2021   TSH 1.400 01/22/2021   HGBA1C 5.8 (H) 05/07/2021   MICROALBUR 30 09/25/2020      Assessment & Plan:   Problem List Items Addressed This Visit       Cardiovascular and Mediastinum   Hypertension associated with diabetes ([place]) - Primary    Recommend continue to work  on eating healthy diet and exercise.         Endocrine   Diabetic polyneuropathy associated with type 2 diabetes mellitus ([place])    Control:  Recommend check sugars fasting daily. Recommend check feet daily. Recommend annual eye exams. Medicines:  Continue to work on eating a healthy diet and exercise.  Labs drawn today.         Relevant Medications   topiramate (TOPAMAX) 25 MG tablet   phentermine 15 MG capsule   Other Relevant Orders   Hemoglobin A1c (Completed)   Microalbumin / creatinine urine ratio (Completed)     Nervous and Auditory   Idiopathic progressive neuropathy    Continue lyrica 100 mg twice daily.       Relevant Orders   Vitamin B12 (Completed)     Other   Primary insomnia (Chronic)    Continue trazodone as needed.       Relevant Orders   Home sleep test   Severe obesity with body mass index (BMI) of 36.0 to 36.9 with serious comorbidity (HCC)    Comorbidities: DIABETES, HYPERTENSION. Hyperlipidemia Start on topamax 0.25 mg before bed x 1 week, then increase to 0.5 mg 2 before bed.  Decrease phentermine 15 mg daily in am.      Relevant Medications   phentermine 15 MG capsule   Mild recurrent major depression (HCC)    No medications needed.      Mixed hyperlipidemia    Well controlled.  No changes to medicines.  Continue to work on eating a healthy diet and exercise.  Labs  drawn today.        Relevant Orders   Lipid panel (Completed)   RLS (restless legs syndrome)    Check sleep study.  Continue requip 3 mg before bed.       Relevant Orders   Vitamin B12 (Completed)   Home sleep test  .  Meds ordered this encounter  Medications   topiramate (TOPAMAX) 25 MG tablet    Sig: Take 1 tablet (25 mg total) by mouth at bedtime for 7 days, THEN 2 tablets (50 mg total) at bedtime for 23 days.    Dispense:  49 tablet    Refill:  0   phentermine 15 MG capsule    Sig: Take 1 capsule (15 mg total) by mouth every morning.    Dispense:  30  capsule    Refill:  2    Orders Placed This Encounter  Procedures   Comprehensive metabolic panel   Hemoglobin A1c   Lipid panel   CBC with Differential/Platelet   Microalbumin / creatinine urine ratio   Vitamin B12   Home sleep test     Follow-up: Return in about 4 weeks (around 06/04/2021) for chronic follow up: wt mgmt. review sleep study.Marland Kitchen  An After Visit Summary was printed and given to the patient.  Rochel Brome, MD [facility] [phone]

## 2021-05-07 NOTE — Patient Instructions (Signed)
Home sleep study 

## 2021-05-08 LAB — CBC WITH DIFFERENTIAL/PLATELET
Basophils Absolute: 0 10*3/uL (ref 0.0–0.2)
Basos: 1 %
EOS (ABSOLUTE): 0.1 10*3/uL (ref 0.0–0.4)
Eos: 1 %
Hematocrit: 44.6 % (ref 34.0–46.6)
Hemoglobin: 14.7 g/dL (ref 11.1–15.9)
Immature Grans (Abs): 0.1 10*3/uL (ref 0.0–0.1)
Immature Granulocytes: 1 %
Lymphocytes Absolute: 2.3 10*3/uL (ref 0.7–3.1)
Lymphs: 27 %
MCH: 28.9 pg (ref 26.6–33.0)
MCHC: 33 g/dL (ref 31.5–35.7)
MCV: 88 fL (ref 79–97)
Monocytes Absolute: 0.6 10*3/uL (ref 0.1–0.9)
Monocytes: 7 %
Neutrophils Absolute: 5.6 10*3/uL (ref 1.4–7.0)
Neutrophils: 63 %
Platelets: 285 10*3/uL (ref 150–450)
RBC: 5.08 x10E6/uL (ref 3.77–5.28)
RDW: 13.4 % (ref 11.7–15.4)
WBC: 8.7 10*3/uL (ref 3.4–10.8)

## 2021-05-08 LAB — LIPID PANEL
Chol/HDL Ratio: 4.8 ratio — ABNORMAL HIGH (ref 0.0–4.4)
Cholesterol, Total: 250 mg/dL — ABNORMAL HIGH (ref 100–199)
HDL: 52 mg/dL (ref >39)
LDL Chol Calc (NIH): 156 mg/dL — ABNORMAL HIGH (ref 0–99)
Triglycerides: 227 mg/dL — ABNORMAL HIGH (ref 0–149)
VLDL Cholesterol Cal: 42 mg/dL — ABNORMAL HIGH (ref 5–40)

## 2021-05-08 LAB — COMPREHENSIVE METABOLIC PANEL
ALT: 8 IU/L (ref 0–32)
AST: 15 IU/L (ref 0–40)
Albumin/Globulin Ratio: 1.9 (ref 1.2–2.2)
Albumin: 4.4 g/dL (ref 3.8–4.8)
Alkaline Phosphatase: 101 IU/L (ref 44–121)
BUN/Creatinine Ratio: 18 (ref 12–28)
BUN: 15 mg/dL (ref 8–27)
Bilirubin Total: 0.3 mg/dL (ref 0.0–1.2)
CO2: 25 mmol/L (ref 20–29)
Calcium: 9.9 mg/dL (ref 8.7–10.3)
Chloride: 101 mmol/L (ref 96–106)
Creatinine, Ser: 0.85 mg/dL (ref 0.57–1.00)
Globulin, Total: 2.3 g/dL (ref 1.5–4.5)
Glucose: 97 mg/dL (ref 70–99)
Potassium: 5.3 mmol/L — ABNORMAL HIGH (ref 3.5–5.2)
Sodium: 139 mmol/L (ref 134–144)
Total Protein: 6.7 g/dL (ref 6.0–8.5)
eGFR: 74 mL/min/{1.73_m2} (ref >59)

## 2021-05-08 LAB — VITAMIN B12: Vitamin B-12: 261 pg/mL (ref 232–1245)

## 2021-05-08 LAB — HEMOGLOBIN A1C
Est. average glucose Bld gHb Est-mCnc: 120 mg/dL
Hgb A1c MFr Bld: 5.8 % — ABNORMAL HIGH (ref 4.8–5.6)

## 2021-05-08 LAB — MICROALBUMIN / CREATININE URINE RATIO
Creatinine, Urine: 29.2 mg/dL
Microalb/Creat Ratio: <10 mg/g creat (ref 0–29)
Microalbumin, Urine: <3 ug/mL

## 2021-05-09 NOTE — Progress Notes (Signed)
Blood count normal.  Liver function normal.  Kidney function normal.  B12 level is low normal.  I would recommend the patient change from B12 1000 over-the-counter to 2500 mg sublingual over-the-counter daily.  She will likely feel better and have more energy. Cholesterol: very poor.  This was well controlled in July.  Got a little worse in October and now it is much worse.  Please inquire as to whether the patient is taking her Lipitor 20 mg once daily.  If she is discontinued it please let me know why.  I strongly recommend we switch her to Crestor 20 mg a day unless she has stopped the Lipitor and would like to restart it HBA1C: 5.8. great.

## 2021-05-10 ENCOUNTER — Encounter: Payer: Self-pay | Admitting: Family Medicine

## 2021-05-10 ENCOUNTER — Other Ambulatory Visit: Payer: Self-pay

## 2021-05-10 DIAGNOSIS — G2581 Restless legs syndrome: Secondary | ICD-10-CM | POA: Insufficient documentation

## 2021-05-10 MED ORDER — VORTIOXETINE HBR 20 MG PO TABS: 20.0000 mg | ORAL_TABLET | Freq: Every day | ORAL | 1 refills | Status: DC

## 2021-05-10 MED ORDER — CYANOCOBALAMIN 2500 MCG SL SUBL: 2500.0000 ug | SUBLINGUAL_TABLET | Freq: Every day | SUBLINGUAL | 1 refills | Status: DC

## 2021-05-10 MED ORDER — RAMIPRIL 2.5 MG PO CAPS: 2.5000 mg | ORAL_CAPSULE | Freq: Every day | ORAL | 1 refills | Status: DC

## 2021-05-10 MED ORDER — SOLIFENACIN SUCCINATE 5 MG PO TABS: 5.0000 mg | ORAL_TABLET | Freq: Every day | ORAL | 1 refills | Status: DC

## 2021-05-10 MED ORDER — TOPIRAMATE 50 MG PO TABS: 50.0000 mg | ORAL_TABLET | Freq: Every day | ORAL | 1 refills | Status: DC

## 2021-05-10 MED ORDER — LORATADINE 10 MG PO TABS: 10.0000 mg | ORAL_TABLET | Freq: Every day | ORAL | 11 refills | Status: DC

## 2021-05-10 MED ORDER — ROSUVASTATIN CALCIUM 20 MG PO TABS: 20.0000 mg | ORAL_TABLET | Freq: Every day | ORAL | 1 refills | Status: DC

## 2021-05-10 MED ORDER — ROPINIROLE HCL 3 MG PO TABS: ORAL_TABLET | ORAL | 1 refills | Status: DC

## 2021-05-10 MED ORDER — PHENTERMINE HCL 15 MG PO CAPS: 15.0000 mg | ORAL_CAPSULE | ORAL | 2 refills | Status: DC

## 2021-05-10 MED ORDER — SEMAGLUTIDE (2 MG/DOSE) 8 MG/3ML ~~LOC~~ SOPN: 2.0000 mg | PEN_INJECTOR | SUBCUTANEOUS | 1 refills | Status: DC

## 2021-05-10 NOTE — Assessment & Plan Note (Signed)
Control:  Recommend check sugars fasting daily. Recommend check feet daily. Recommend annual eye exams. Medicines:  Continue to work on eating a healthy diet and exercise.  Labs drawn today.    

## 2021-05-10 NOTE — Assessment & Plan Note (Signed)
Continue lyrica 100 mg twice daily.

## 2021-05-10 NOTE — Assessment & Plan Note (Signed)
No medications needed

## 2021-05-10 NOTE — Assessment & Plan Note (Signed)
Recommend continue to work on eating healthy diet and exercise.  

## 2021-05-10 NOTE — Assessment & Plan Note (Signed)
Check sleep study.  Continue requip 3 mg before bed.

## 2021-05-10 NOTE — Assessment & Plan Note (Addendum)
Comorbidities: DIABETES, HYPERTENSION. Hyperlipidemia Start on topamax 0.25 mg before bed x 1 week, then increase to 0.5 mg 2 before bed.  Decrease phentermine 15 mg daily in am.

## 2021-05-10 NOTE — Assessment & Plan Note (Signed)
Continue trazodone as needed. 

## 2021-05-10 NOTE — Assessment & Plan Note (Signed)
Well controlled.  ?No changes to medicines.  ?Continue to work on eating a healthy diet and exercise.  ?Labs drawn today.  ?

## 2021-05-11 NOTE — Telephone Encounter (Signed)
Patient has some extras of other meds, wanting to hold off on delivery of a few things. Will f/u next month

## 2021-05-14 ENCOUNTER — Other Ambulatory Visit: Payer: Self-pay | Admitting: Family Medicine

## 2021-05-17 ENCOUNTER — Other Ambulatory Visit: Payer: Self-pay

## 2021-05-17 MED ORDER — BUDESONIDE-FORMOTEROL FUMARATE 160-4.5 MCG/ACT IN AERO: 2.0000 | INHALATION_SPRAY | Freq: Two times a day (BID) | RESPIRATORY_TRACT | 3 refills | Status: DC

## 2021-05-19 ENCOUNTER — Other Ambulatory Visit: Payer: Self-pay | Admitting: Family Medicine

## 2021-05-25 ENCOUNTER — Other Ambulatory Visit: Payer: Self-pay | Admitting: Family Medicine

## 2021-05-28 ENCOUNTER — Other Ambulatory Visit: Payer: Self-pay | Admitting: Family Medicine

## 2021-05-28 MED ORDER — RYBELSUS 7 MG PO TABS: 7.0000 mg | ORAL_TABLET | Freq: Every day | ORAL | 2 refills | Status: DC

## 2021-05-30 ENCOUNTER — Other Ambulatory Visit: Payer: Self-pay | Admitting: Family Medicine

## 2021-05-31 ENCOUNTER — Other Ambulatory Visit: Payer: Self-pay | Admitting: Family Medicine

## 2021-06-01 DIAGNOSIS — R0602 Shortness of breath: Secondary | ICD-10-CM | POA: Diagnosis not present

## 2021-06-01 DIAGNOSIS — G4733 Obstructive sleep apnea (adult) (pediatric): Secondary | ICD-10-CM | POA: Diagnosis not present

## 2021-06-02 DIAGNOSIS — G4733 Obstructive sleep apnea (adult) (pediatric): Secondary | ICD-10-CM | POA: Diagnosis not present

## 2021-06-02 DIAGNOSIS — R0602 Shortness of breath: Secondary | ICD-10-CM | POA: Diagnosis not present

## 2021-06-07 ENCOUNTER — Telehealth: Payer: Self-pay

## 2021-06-07 ENCOUNTER — Other Ambulatory Visit: Payer: Self-pay

## 2021-06-07 DIAGNOSIS — E1142 Type 2 diabetes mellitus with diabetic polyneuropathy: Secondary | ICD-10-CM

## 2021-06-07 MED ORDER — PREGABALIN 100 MG PO CAPS: 100.0000 mg | ORAL_CAPSULE | Freq: Two times a day (BID) | ORAL | 0 refills | Status: DC

## 2021-06-07 MED ORDER — OMEPRAZOLE 20 MG PO CPDR: 20.0000 mg | DELAYED_RELEASE_CAPSULE | Freq: Every day | ORAL | 1 refills | Status: DC

## 2021-06-07 MED ORDER — DICYCLOMINE HCL 20 MG PO TABS: 20.0000 mg | ORAL_TABLET | Freq: Four times a day (QID) | ORAL | 2 refills | Status: DC

## 2021-06-07 NOTE — Chronic Care Management (AMB) (Signed)
? ? ?Chronic Care Management ?Pharmacy Assistant  ? ?Name: Hailey Greene  MRN: [id number] DOB: [birth year] ? ? ?Reason for Encounter: Medication Coordination for [facility]  ?  ?Recent office visits:  ?05/07/21 Rochel Brome MD. Seen for DM, HLD and Knee Pain. Started on Topiramate 25mg . Decreased Phentermine HCI from 37.5mg  to 15mg  daily. D/C Omeprazole 20mg .  ? ?Recent consult visits:  ?None ? ?Hospital visits:  ?None ? ?Medications: ?Outpatient Encounter Medications as of 06/07/2021  ?Medication Sig  ? ASPIRIN 81 PO Take 81 mg by mouth daily.  ? Blood Glucose Monitoring Suppl (ONETOUCH VERIO) w/Device KIT Use daily to check FBS  ? budesonide-formoterol (SYMBICORT) 160-4.5 MCG/ACT inhaler Inhale 2 puffs into the lungs 2 (two) times daily.  ? Cyanocobalamin 2500 MCG SUBL Place 1 tablet (2,500 mcg total) under the tongue daily.  ? eszopiclone (LUNESTA) 2 MG TABS tablet TAKE ONE TABLET BY MOUTH AT BEDTIME AS NEEDED FOR SLEEP. take immediately BEFORE bedtime  ? fluticasone (FLONASE) 50 MCG/ACT nasal spray 2 sprays each nostril once daliy  ? glucose blood (ONETOUCH VERIO) test strip E11.42 Use new test strip each time when checking FBS  ? Lancets (ONETOUCH DELICA PLUS [id number]) MISC E 11.42 Use new lancet each time when checking FBS  ? loratadine (CLARITIN) 10 MG tablet Take 1 tablet (10 mg total) by mouth daily.  ? Multiple Vitamin (MULTIVITAMIN WITH MINERALS) TABS tablet Take 1 tablet by mouth daily.  ? ondansetron (ZOFRAN) 4 MG tablet TAKE ONE TABLET BY MOUTH EVERY 6 HOURS AS NEEDED FOR NAUSEA AND VOMITING  ? phentermine 15 MG capsule Take 1 capsule (15 mg total) by mouth every morning.  ? pregabalin (LYRICA) 100 MG capsule Take 1 capsule (100 mg total) by mouth 2 (two) times daily.  ? ramipril (ALTACE) 2.5 MG capsule Take 1 capsule (2.5 mg total) by mouth daily.  ? rOPINIRole (REQUIP) 3 MG tablet TAKE 1 TABLET BY MOUTH ONCE DAILY 1-3 HOURS BEFORE  BEDTIME  ? rosuvastatin (CRESTOR) 20 MG tablet Take 1 tablet (20 mg  total) by mouth daily.  ? Semaglutide (RYBELSUS) 7 MG TABS Take 7 mg by mouth daily.  ? solifenacin (VESICARE) 5 MG tablet Take 1 tablet (5 mg total) by mouth daily.  ? topiramate (TOPAMAX) 50 MG tablet Take 1 tablet (50 mg total) by mouth daily.  ? traZODone (DESYREL) 50 MG tablet TAKE 1/2 TO 1 TAB BY MOUTH AT bedtime AS NEEDED FOR SLEEP (Patient not taking: Reported on 05/11/2021)  ? triamcinolone cream (KENALOG) 0.1 % APPLY ONE application TOPICALLY TWICE DAILY  ? vitamin B-12 1000 MCG tablet Take 1 tablet (1,000 mcg total) by mouth daily.  ? vortioxetine HBr (TRINTELLIX) 20 MG TABS tablet Take 1 tablet (20 mg total) by mouth daily.  ? ?No facility-administered encounter medications on file as of 06/07/2021.  ? ? ?Reviewed chart for medication changes ahead of medication coordination call. ? ?No Consults, or hospital visits since last care coordination call/Pharmacist visit. ? ?BP Readings from Last 3 Encounters:  ?05/07/21 120/60  ?03/11/21 128/80  ?01/22/21 128/72  ?  ?Lab Results  ?Component Value Date  ? HGBA1C 5.8 (H) 05/07/2021  ?  ? ?Patient obtains medications through V[facility]   Other   ? ?Last adherence delivery included:  ?Triamcinolone 0.10% apply topically twice daily ?Eszopiclone 2mg  1 tab at bedtime prn  ?Phentermine 37.5 mg daily  ?Zofran 4mg - Take one tablet by mouth every 6 hrs prn ?Ozempic 2mg - Inject 2mg  once a week  ?Vitamin B12 1052mcg-  Take 1 daily  ?Vesicare 9m- Take 1 daily  ?Ropinirole 340m Take 1 by mouth once daily ?Topiramate 2535make 1 tablet (25 mg total) by mouth at bedtime for 7 days, THEN 2 tablets (50 mg total) at bedtime for 23 days. ? ?Patient declined (meds) last month  ?Flonase - Has enough supply, does not need ?Amitriptyline- D/C by provider due to weight loss ?Belsomra  Just fill 04/30/21 ?Trazodone 68m16milled on 05/06/21 90ds ?Atorvastatin 20mg56mlled on 04/27/21 51 days ? ?Patient is due for next adherence delivery on: 06/17/21. ?Called patient and reviewed medications and  coordinated delivery. ? ?This delivery to include: ?Triamcinolone 0.10% apply topically twice daily ?Eszopiclone 2mg 181mb at bedtime prn  ?Phentermine 15 mg daily  ?Rosuvastatin 20mg d68m  ?Pregabalin 100mg 1 66mule twice daily  ?Onetouch Delica Lancets 33g ?One88Ech Verio Test Strips ?Dicyclomine 20mg 1 t59mt every 6 hours prn  ?Omeprazole 20mg 1 Ca54me Daily  ?Zofran 4mg 1 tab 23mry 6 hours prn  ? ?Patient declined the following medications  ?Meds not due until May:  ?Rybelsus 7mg ?Symbic69m Inhaler ?Topiramate 68mg ?Vesica3mmg ?Ropiniro31m3mg ?Trintelli70m0mg ?Ramipril 42mg ?Trazodone 541m ? ?Patient n25m refills- Request Sent  ?Pregabalin 100mg  ?Dicyclomine57mg  ?Omeprazole 261m ? ?Confirmed d12mery date of 06/17/21, advised patient that pharmacy will contact them the morning of delivery. ? ?Danielle Gerringer, CElray Mcgregoracist Assistant  ?[phone]  ?[phone]

## 2021-06-08 ENCOUNTER — Ambulatory Visit: Payer: Medicare Other | Admitting: Nurse Practitioner

## 2021-06-08 NOTE — Telephone Encounter (Signed)
Compliant on meds 

## 2021-06-17 DIAGNOSIS — G4733 Obstructive sleep apnea (adult) (pediatric): Secondary | ICD-10-CM | POA: Diagnosis not present

## 2021-06-18 ENCOUNTER — Telehealth: Payer: Self-pay

## 2021-06-18 NOTE — Telephone Encounter (Signed)
Spoke with Hailey Greene over several phone calls this AM.  ? ?Patient left VM stating she received her cpap machine and oxygen and unsure why.  ? ?Returned call to patient. She states she received cpap and oxygen concentrator yesterday. She was educated on both and she thought she was told she did not need oxygen. Made her aware I would review results and le her know.  ?Reviewed results, supplemental oxygen at 2 L with ambulation was recommended. Did let patient know, she stated she did not have any questions at this time.  ? ?She returned call stating she was told yesterday she did not need oxygen and feels she does not need oxygen or cpap. Would like Dr Sedalia Muta to clarify. Made her aware Dr Sedalia Muta would be back in office on Monday. She VU.  ? ?Please clarify what is needed/recommended.  ? ?Hailey Greene 06/18/21 9:56 AM ? ?

## 2021-06-22 NOTE — Telephone Encounter (Signed)
Patient calling requesting update.  ? ?Hailey Greene 06/22/21 10:23 AM ? ?

## 2021-06-23 NOTE — Telephone Encounter (Signed)
Attempted to call patient with update on cpap and oxygen. LMTRC. ? ?Terrill Mohr 06/23/21 2:25 PM ? ?

## 2021-06-23 NOTE — Telephone Encounter (Signed)
Patient returned call. She is aware she does need the oxygen. Explained walk test and oxygen results. She will begin using this. She is concerned because of size of the machine. Made her aware she can always let us know if she feels this is a concern for fall/trip risk. She VU.  ? ?Terrill Mohr 06/23/21 3:06 PM ? ?

## 2021-06-30 ENCOUNTER — Other Ambulatory Visit: Payer: Self-pay | Admitting: Family Medicine

## 2021-07-05 ENCOUNTER — Other Ambulatory Visit: Payer: Self-pay | Admitting: Family Medicine

## 2021-07-06 ENCOUNTER — Telehealth: Payer: Self-pay

## 2021-07-06 NOTE — Progress Notes (Addendum)
Tried to reach pt today to see if she needs her Alfonso Patten that is due on 4/21 and if so we were going to do an acute fill. ? ?I will try again later ? ?07/07/21- Called and lvm to return my call  ? ?07/12/21- Spoke with pt and agreed to send her Lunesta on 4/21. I filled out an acute form and sent to [facility] ? ?Roxana Hires, CMA ?Clinical Pharmacist Assistant  ?[phone]  ?

## 2021-07-12 ENCOUNTER — Telehealth: Payer: Self-pay

## 2021-07-12 NOTE — Progress Notes (Signed)
? ? ?Chronic Care Management ?Pharmacy Assistant  ? ?Name: Hailey Greene  MRN: [id number] DOB: 1950-09-13 ? ? ?Reason for Encounter: Disease State cal for Lipids  ?  ?Recent office visits:  ?None ? ?Recent consult visits:  ?None ? ?Hospital visits:  ?None ? ?Medications: ?Outpatient Encounter Medications as of 07/12/2021  ?Medication Sig  ? ASPIRIN 81 PO Take 81 mg by mouth daily.  ? Blood Glucose Monitoring Suppl (ONETOUCH VERIO) w/Device KIT Use daily to check FBS  ? budesonide-formoterol (SYMBICORT) 160-4.5 MCG/ACT inhaler Inhale 2 puffs into the lungs 2 (two) times daily.  ? Cyanocobalamin 2500 MCG SUBL Place 1 tablet (2,500 mcg total) under the tongue daily.  ? dicyclomine (BENTYL) 20 MG tablet Take 1 tablet (20 mg total) by mouth every 6 (six) hours.  ? eszopiclone (LUNESTA) 2 MG TABS tablet TAKE ONE TABLET BY MOUTH EVERYDAY AT BEDTIME AS NEEDED FOR SLEEP. take immediately BEFORE bedtime  ? fluticasone (FLONASE) 50 MCG/ACT nasal spray 2 sprays each nostril once daliy  ? glucose blood (ONETOUCH VERIO) test strip E11.42 Use new test strip each time when checking FBS  ? Lancets (ONETOUCH DELICA PLUS [id number]) MISC E 11.42 Use new lancet each time when checking FBS  ? loratadine (CLARITIN) 10 MG tablet Take 1 tablet (10 mg total) by mouth daily.  ? Multiple Vitamin (MULTIVITAMIN WITH MINERALS) TABS tablet Take 1 tablet by mouth daily.  ? omeprazole (PRILOSEC) 20 MG capsule Take 1 capsule (20 mg total) by mouth daily.  ? ondansetron (ZOFRAN) 4 MG tablet TAKE ONE TABLET BY MOUTH EVERY 6 HOURS AS NEEDED FOR NAUSEA AND VOMITING  ? phentermine 15 MG capsule Take 1 capsule (15 mg total) by mouth every morning.  ? pregabalin (LYRICA) 100 MG capsule Take 1 capsule (100 mg total) by mouth 2 (two) times daily.  ? ramipril (ALTACE) 2.5 MG capsule Take 1 capsule (2.5 mg total) by mouth daily.  ? rOPINIRole (REQUIP) 3 MG tablet TAKE 1 TABLET BY MOUTH ONCE DAILY 1-3 HOURS BEFORE  BEDTIME  ? rosuvastatin (CRESTOR) 20 MG  tablet Take 1 tablet (20 mg total) by mouth daily.  ? Semaglutide (RYBELSUS) 7 MG TABS Take 7 mg by mouth daily.  ? solifenacin (VESICARE) 5 MG tablet Take 1 tablet (5 mg total) by mouth daily.  ? topiramate (TOPAMAX) 50 MG tablet Take 1 tablet (50 mg total) by mouth daily.  ? traZODone (DESYREL) 50 MG tablet TAKE 1/2 TO 1 TAB BY MOUTH AT bedtime AS NEEDED FOR SLEEP (Patient not taking: Reported on 05/11/2021)  ? triamcinolone cream (KENALOG) 0.1 % APPLY ONE application TOPICALLY TWICE DAILY  ? vitamin B-12 1000 MCG tablet Take 1 tablet (1,000 mcg total) by mouth daily.  ? vortioxetine HBr (TRINTELLIX) 20 MG TABS tablet Take 1 tablet (20 mg total) by mouth daily.  ? ?No facility-administered encounter medications on file as of 07/12/2021.  ? ? ?Lipid Panel ?   ?Component Value Date/Time  ? CHOL 250 (H) 05/07/2021 1029  ? TRIG 227 (H) 05/07/2021 1029  ? HDL 52 05/07/2021 1029  ? [place] 156 (H) 05/07/2021 1029  ?  ?10-year ASCVD risk score: The 10-year ASCVD risk score (Arnett DK, et al., 2019) is: 22% ?  Values used to calculate the score: ?    Age: 71 years ?    Sex: Female ?    Is Non-Hispanic African American: No ?    Diabetic: Yes ?    Tobacco smoker: No ?    Systolic Blood  Pressure: 120 mmHg ?    Is BP treated: Yes ?    HDL Cholesterol: 52 mg/dL ?    Total Cholesterol: 250 mg/dL ? ?Current antihyperlipidemic regimen:  ?Rosuvastatin 49m  daily  ? ?ASCVD risk enhancing conditions: age >>63 DM, and HTN ? ?What recent interventions/DTPs have been made by any provider to improve Cholesterol control since last CPP Visit: Pt denies any changes  ? ?Any recent hospitalizations or ED visits since last visit with CPP? No ? ?What diet changes have been made to improve Cholesterol?  ?Pt is watching what she eats and drinking a lot more water  ? ?What exercise is being done to improve Cholesterol?  ?Pt is walking a lot more lately  ? ?Adherence Review: ?Does the patient have >5 day gap between last estimated fill dates?  No ? ?Star Rating Drugs ?Medication Name Last Fill Days supply ?Rosuvastatin   06/11/21 75ds ? ?Care Gaps: ?Last annual wellness visit? 03/11/21 ? ? ?DElray Mcgregor CMA ?Clinical Pharmacist Assistant  ?[phone] ?

## 2021-07-15 ENCOUNTER — Other Ambulatory Visit: Payer: Self-pay | Admitting: Family Medicine

## 2021-07-18 DIAGNOSIS — G4733 Obstructive sleep apnea (adult) (pediatric): Secondary | ICD-10-CM | POA: Diagnosis not present

## 2021-07-27 ENCOUNTER — Other Ambulatory Visit: Payer: Self-pay | Admitting: Family Medicine

## 2021-08-05 ENCOUNTER — Other Ambulatory Visit: Payer: Self-pay

## 2021-08-05 ENCOUNTER — Telehealth: Payer: Self-pay

## 2021-08-05 LAB — HM DIABETES EYE EXAM

## 2021-08-05 MED ORDER — DICYCLOMINE HCL 20 MG PO TABS: 20.0000 mg | ORAL_TABLET | Freq: Four times a day (QID) | ORAL | 2 refills | Status: DC

## 2021-08-05 MED ORDER — ESZOPICLONE 2 MG PO TABS
ORAL_TABLET | ORAL | 0 refills | Status: DC
Start: 2021-08-05 — End: 2021-09-02

## 2021-08-05 MED ORDER — BUDESONIDE-FORMOTEROL FUMARATE 160-4.5 MCG/ACT IN AERO: 2.0000 | INHALATION_SPRAY | Freq: Two times a day (BID) | RESPIRATORY_TRACT | 3 refills | Status: DC

## 2021-08-05 MED ORDER — RYBELSUS 7 MG PO TABS: 7.0000 mg | ORAL_TABLET | Freq: Every day | ORAL | 2 refills | Status: DC

## 2021-08-05 NOTE — Chronic Care Management (AMB) (Addendum)
? ? ?Chronic Care Management ?Pharmacy Assistant  ? ?Name: Hailey Greene  MRN: [id number] DOB: 06-May-1950 ? ?Reason for Encounter: Medication Review/ Medication coordination ? ?Recent office visits:  ?None ? ?Recent consult visits:  ?None ? ?Hospital visits:  ?None in previous 6 months ? ?Medications: ?Outpatient Encounter Medications as of 08/05/2021  ?Medication Sig  ? ASPIRIN 81 PO Take 81 mg by mouth daily.  ? Blood Glucose Monitoring Suppl (ONETOUCH VERIO) w/Device KIT Use daily to check FBS  ? budesonide-formoterol (SYMBICORT) 160-4.5 MCG/ACT inhaler Inhale 2 puffs into the lungs 2 (two) times daily.  ? Cyanocobalamin 2500 MCG SUBL Place 1 tablet (2,500 mcg total) under the tongue daily.  ? dicyclomine (BENTYL) 20 MG tablet Take 1 tablet (20 mg total) by mouth every 6 (six) hours.  ? eszopiclone (LUNESTA) 2 MG TABS tablet TAKE ONE TABLET BY MOUTH EVERYDAY AT BEDTIME AS NEEDED FOR SLEEP. take immediately BEFORE bedtime  ? fluticasone (FLONASE) 50 MCG/ACT nasal spray 2 sprays each nostril once daliy  ? glucose blood (ONETOUCH VERIO) test strip E11.42 Use new test strip each time when checking FBS  ? Lancets (ONETOUCH DELICA PLUS [id number]) MISC E 11.42 Use new lancet each time when checking FBS  ? loratadine (CLARITIN) 10 MG tablet Take 1 tablet (10 mg total) by mouth daily.  ? Multiple Vitamin (MULTIVITAMIN WITH MINERALS) TABS tablet Take 1 tablet by mouth daily.  ? omeprazole (PRILOSEC) 20 MG capsule Take 1 capsule (20 mg total) by mouth daily.  ? ondansetron (ZOFRAN) 4 MG tablet TAKE ONE TABLET BY MOUTH every SIX hours AS NEEDED FOR NAUSEA AND VOMITING  ? phentermine 15 MG capsule TAKE ONE CAPSULE BY MOUTH EVERY MORNING  ? pregabalin (LYRICA) 100 MG capsule Take 1 capsule (100 mg total) by mouth 2 (two) times daily.  ? ramipril (ALTACE) 2.5 MG capsule Take 1 capsule (2.5 mg total) by mouth daily.  ? rOPINIRole (REQUIP) 3 MG tablet TAKE 1 TABLET BY MOUTH ONCE DAILY 1-3 HOURS BEFORE  BEDTIME  ? rosuvastatin  (CRESTOR) 20 MG tablet Take 1 tablet (20 mg total) by mouth daily.  ? Semaglutide (RYBELSUS) 7 MG TABS Take 7 mg by mouth daily.  ? solifenacin (VESICARE) 5 MG tablet Take 1 tablet (5 mg total) by mouth daily.  ? topiramate (TOPAMAX) 50 MG tablet Take 1 tablet (50 mg total) by mouth daily.  ? traZODone (DESYREL) 50 MG tablet TAKE 1/2 TO 1 TAB BY MOUTH AT bedtime AS NEEDED FOR SLEEP (Patient not taking: Reported on 05/11/2021)  ? triamcinolone cream (KENALOG) 0.1 % APPLY ONE application TOPICALLY TWICE DAILY  ? vitamin B-12 1000 MCG tablet Take 1 tablet (1,000 mcg total) by mouth daily.  ? vortioxetine HBr (TRINTELLIX) 20 MG TABS tablet Take 1 tablet (20 mg total) by mouth daily.  ? ?No facility-administered encounter medications on file as of 08/05/2021.  ? ?Reviewed chart for medication changes ahead of medication coordination call. ? ?No OVs, Consults, or hospital visits since last care coordination call/Pharmacist visit.  ? ?No medication changes indicated OR if recent visit, treatment plan here. ? ?BP Readings from Last 3 Encounters:  ?05/07/21 120/60  ?03/11/21 128/80  ?01/22/21 128/72  ?  ?Lab Results  ?Component Value Date  ? HGBA1C 5.8 (H) 05/07/2021  ? Reviewed chart for medication changes ahead of medication coordination call. ? ?No OVs, Consults, or hospital visits since last care coordination call/Pharmacist visit. (If appropriate, list visit date, provider name) ? ?No medication changes indicated OR if recent  visit, treatment plan here. ? ?BP Readings from Last 3 Encounters:  ?05/07/21 120/60  ?03/11/21 128/80  ?01/22/21 128/72  ?  ?Lab Results  ?Component Value Date  ? HGBA1C 5.8 (H) 05/07/2021  ?  ? ?Patient obtains medications through Vials  90 Days  ? ?Last adherence delivery included:  ?Triamcinolone 0.10% apply topically twice daily ?Eszopiclone 21m 1 tab at bedtime prn  ?Phentermine 37.5 mg daily  ?Zofran 430m Take one tablet by mouth every 6 hrs prn ?Ozempic 14m38mInject 14mg114mce a week  ?Vitamin  B12 1000mc31make 1 daily  ?Vesicare 5mg- 76me 1 daily  ?Ropinirole 3mg- T34m 1 by mouth once daily ?Topiramate 25mg-Ta99m tablet (25 mg total) by mouth at bedtime for 7 days, THEN 2 tablets (50 mg total) at bedtime for 23 days. ? ?Patient declined (meds) last month: ?Flonase - Has enough supply, does not need ?Amitriptyline- D/C by provider due to weight loss ?Belsomra  Just fill 04/30/21 ?Trazodone 50mg- Fi814m on 05/06/21 90ds ?Atorvastatin 20mg  Fil64mon 04/27/21 51 days ? ?Patient is due for next adherence delivery on: 08-17-2021 ? ?Remove Ozempic due to being discontinued. ? ?Patient needs refills for: refill request sent ?Dicyclomine ?Rybelsus ?Symbicort inhaler ?Lunesta ? ? ?Unable to confirmed delivery date of 08-17-2021 ? ?08-05-2021: 1st attempt left VM ?08-06-2021: 2nd attempt left VM ?08-09-2021: 3rd attempt left VM ? ?Malecca Hicks CMA ?Clinical Pharmacist Assistant ?[phone]

## 2021-08-09 NOTE — Telephone Encounter (Signed)
Removed Ozempic from [facility] fill (Now on Rybelsus) ? ?Patient on both Trazodone and Eszopiclone, wanted to verify with PCP before filling ?

## 2021-08-10 ENCOUNTER — Ambulatory Visit (INDEPENDENT_AMBULATORY_CARE_PROVIDER_SITE_OTHER): Payer: Medicare Other | Admitting: Nurse Practitioner

## 2021-08-10 ENCOUNTER — Encounter: Payer: Self-pay | Admitting: Nurse Practitioner

## 2021-08-10 ENCOUNTER — Other Ambulatory Visit: Payer: Self-pay | Admitting: Nurse Practitioner

## 2021-08-10 VITALS — BP 132/72 | HR 77 | Temp 97.6°F | Ht 63.0 in | Wt 197.0 lb

## 2021-08-10 DIAGNOSIS — E1142 Type 2 diabetes mellitus with diabetic polyneuropathy: Secondary | ICD-10-CM | POA: Diagnosis not present

## 2021-08-10 DIAGNOSIS — E6609 Other obesity due to excess calories: Secondary | ICD-10-CM

## 2021-08-10 DIAGNOSIS — J4520 Mild intermittent asthma, uncomplicated: Secondary | ICD-10-CM

## 2021-08-10 DIAGNOSIS — G603 Idiopathic progressive neuropathy: Secondary | ICD-10-CM

## 2021-08-10 DIAGNOSIS — E782 Mixed hyperlipidemia: Secondary | ICD-10-CM

## 2021-08-10 DIAGNOSIS — E1159 Type 2 diabetes mellitus with other circulatory complications: Secondary | ICD-10-CM | POA: Diagnosis not present

## 2021-08-10 DIAGNOSIS — F5101 Primary insomnia: Secondary | ICD-10-CM | POA: Diagnosis not present

## 2021-08-10 DIAGNOSIS — E1169 Type 2 diabetes mellitus with other specified complication: Secondary | ICD-10-CM

## 2021-08-10 DIAGNOSIS — Z1231 Encounter for screening mammogram for malignant neoplasm of breast: Secondary | ICD-10-CM | POA: Diagnosis not present

## 2021-08-10 DIAGNOSIS — Z13820 Encounter for screening for osteoporosis: Secondary | ICD-10-CM

## 2021-08-10 DIAGNOSIS — K219 Gastro-esophageal reflux disease without esophagitis: Secondary | ICD-10-CM

## 2021-08-10 DIAGNOSIS — I152 Hypertension secondary to endocrine disorders: Secondary | ICD-10-CM

## 2021-08-10 DIAGNOSIS — I10 Essential (primary) hypertension: Secondary | ICD-10-CM

## 2021-08-10 DIAGNOSIS — R110 Nausea: Secondary | ICD-10-CM

## 2021-08-10 DIAGNOSIS — J301 Allergic rhinitis due to pollen: Secondary | ICD-10-CM

## 2021-08-10 DIAGNOSIS — J3089 Other allergic rhinitis: Secondary | ICD-10-CM

## 2021-08-10 DIAGNOSIS — F330 Major depressive disorder, recurrent, mild: Secondary | ICD-10-CM

## 2021-08-10 MED ORDER — LORATADINE 10 MG PO TABS: 10.0000 mg | ORAL_TABLET | Freq: Every day | ORAL | 11 refills | Status: DC

## 2021-08-10 MED ORDER — FLUTICASONE PROPIONATE 50 MCG/ACT NA SUSP: NASAL | 2 refills | Status: DC

## 2021-08-10 MED ORDER — BLOOD GLUCOSE METER KIT: PACK | 0 refills | Status: DC

## 2021-08-10 MED ORDER — CYANOCOBALAMIN 2500 MCG SL SUBL: 2500.0000 ug | SUBLINGUAL_TABLET | Freq: Every day | SUBLINGUAL | 1 refills | Status: DC

## 2021-08-10 MED ORDER — ONETOUCH VERIO VI STRP: ORAL_STRIP | 2 refills | Status: DC

## 2021-08-10 MED ORDER — ONDANSETRON HCL 4 MG PO TABS: ORAL_TABLET | ORAL | 1 refills | Status: DC

## 2021-08-10 MED ORDER — DICYCLOMINE HCL 20 MG PO TABS: 20.0000 mg | ORAL_TABLET | Freq: Four times a day (QID) | ORAL | 2 refills | Status: DC

## 2021-08-10 MED ORDER — PREGABALIN 100 MG PO CAPS: 100.0000 mg | ORAL_CAPSULE | Freq: Two times a day (BID) | ORAL | 0 refills | Status: DC

## 2021-08-10 MED ORDER — BUDESONIDE-FORMOTEROL FUMARATE 160-4.5 MCG/ACT IN AERO: 2.0000 | INHALATION_SPRAY | Freq: Two times a day (BID) | RESPIRATORY_TRACT | 3 refills | Status: DC

## 2021-08-10 MED ORDER — ONETOUCH DELICA PLUS LANCET33G MISC: 2 refills | Status: DC

## 2021-08-10 MED ORDER — OMEPRAZOLE 20 MG PO CPDR: 20.0000 mg | DELAYED_RELEASE_CAPSULE | Freq: Every day | ORAL | 1 refills | Status: DC

## 2021-08-10 MED ORDER — PHENTERMINE HCL 15 MG PO CAPS: 15.0000 mg | ORAL_CAPSULE | Freq: Every morning | ORAL | 2 refills | Status: DC

## 2021-08-10 NOTE — Patient Instructions (Signed)
We will call you with lab results, mammogram, and DEXA scan appointments ?Continue medications ?Follow-up in 96-month, fasting with Dr CTobie Poet? ? ? ? ?Bone Health Information for Aging Adults ?Your bones do more than support your body. They also store calcium. The inside of your bones (marrow) makes blood cells. Maintaining bone health becomes more important as you age because your bones replace all their cells about every 10 years. Around age 23244 it gets harder to replace those cells, and bones can become weak. Weak bones can lead to osteoporosis and breaks (fractures). Falls also become more likely, which can cause fractures. The good news is that, with diet and exercise, you can improve and maintain your bone health at any age. ?How to eat for bone health ?A balanced diet can supply many of the vitamins, minerals, and proteins you need for bone health. Older adults need to make sure they get enough calcium, vitamin D, and magnesium. You may need more of these as you age. ?Calcium ? ?Calcium is the most important (essential) mineral for bone health. The daily requirement for calcium for adult men aged 60-70 years is 1,000 mg. For adult women aged 658-70years, it is 1,200 mg. At age 23847or older, it is 1,200 mg for both men and women. ?Sources of calcium in your diet include: ?Dairy foods like milk, yogurt, and cheese. Dairy foods are the best sources. If you cannot eat dairy, you may need a calcium supplement. ?Leafy, dark green vegetables. These include collard greens, kale, broccoli, bok choy, and okra. ?Fatty fish, like sardines and canned salmon with bones. ?Almonds. ?Tofu. ? ?Vitamin D ?You need vitamin D for bone health because it helps your body absorb calcium from your diet. It is not found naturally in many foods. Many people can benefit from taking a supplement. The daily requirement for vitamin D at age 23227or older is 600-1,000 international units (IU). ?Dietary sources for vitamin D include: ?Fatty fish,  such as swordfish, salmon, sardine, and mackerel. ?Foods that have vitamin D added to them (are fortified), like cereal and dairy products. ?Egg yolks. ?Magnesium ?Magnesium helps your body use both calcium and vitamin D. The recommended daily intake for adult men is 400-420 mg. For adult women, it is 310-320 mg. Dietary sources of magnesium include: ?Green vegetables, such as collard greens, kale, bok choy, and okra. ?Poppy, sesame, and chia seeds. ?Legumes, including peas and beans. ?Whole grains. ?Avocados. ?Nuts. ?If you drink alcohol regularly or take a type of antacid called a proton pump inhibitor, you might benefit from a magnesium supplement. ?How to exercise for bone health ?Exercise is important for bone health because it strengthens bones and muscles. Bones are living organs that get stronger when you exercise them, just like your heart and other muscles. Muscles support your bones and protect your joints. Strong muscles also help prevent bone loss and falls. Weight-bearing exercises and resistance exercises are the two types of exercise that are most important for bone health. ?Weight-bearing exercises include running or walking, climbing stairs, and playing sports like tennis. ?Resistance exercises include those done using free weights, weight machines, or resistance bands. ?Try to get at least 30 minutes of exercise every day. You can also include stretching, balance, and flexibility exercises, like yoga or tai chi. These lower your risk of falls. ?Follow these instructions at home ?Ask your health care provider if: ?You need a bone density test. This is especially important for women older than 618and men older  than 70. ?You have been losing height. Loss of height may be a sign of weakening bones in your spine. ?Any of your medicines or medical conditions could affect your bone health. ?He or she could recommend an exercise program that is safe for you. Be sure it includes both weight-bearing and  resistance exercises. ?Do not use any products that contain nicotine or tobacco. These products include cigarettes, chewing tobacco, and vaping devices, such as e-cigarettes. These can reduce bone density. If you need help quitting, ask your health care provider. ?Drink alcohol in moderation. Alcohol reduces bone density and increases your risk for falls. If you drink alcohol: ?Limit how much you have to: ?0-1 drink a day for women who are not pregnant. ?0-2 drinks a day for men. ?Know how much alcohol is in a drink. In the [place] one drink equals one 12 oz bottle of beer (355 mL), one 5 oz glass of wine (148 mL), or one 1? oz glass of hard liquor (44 mL). ?Take over-the-counter and prescription medicines only as told by your health care provider. Ask your health care provider if you could benefit from taking calcium, vitamin D, or magnesium supplements. ?Keep all follow-up visits. This is important. ?Where to find more information ?American Bone Health: CardKnowledge.fi ?American Academy of Orthopaedic Surgeons: orthoinfo.org ?[employer] of Health: bones.SouthExposed.es ?Summary ?Maintaining your bone health becomes more important as you age. ?You can improve and maintain your bone health with diet and exercise at any age. ?A balanced diet can supply many of the vitamins, minerals, and proteins you need for bone health. Older adults need to make sure they get enough calcium, vitamin D, and magnesium. ?Ask your health care provider if you could benefit from taking calcium, vitamin D, or magnesium supplements. ?Exercise is important for bone health because it strengthens bones and muscles. Do both weight-bearing and resistance exercises. ?This information is not intended to replace advice given to you by your health care provider. Make sure you discuss any questions you have with your health care provider. ?Document Revised: 08/26/2020 Document Reviewed: 08/26/2020 ?Elsevier Patient Education ? [place]. ? ?

## 2021-08-10 NOTE — Progress Notes (Signed)
? ?Subjective:  ?Patient ID: Hailey Greene, female    DOB: [birth year]  Age: 71 y.o. MRN: [id number] ? ?Chief Complaint  ?Patient presents with  ? Diabetes  ? Hyperlipidemia  ? Hypertension  ? ? ?HPI ? Hailey Greene has requested a prescription for a power wheelchair and new glucose monitor. States she has neuropathy that prohibits her from walking for long periods of time and shopping at large stores such as Paediatric nurse. Encouraged pt to make f/u for [facility] evaluation.  ? ?Diabetes Mellitus Type II, follow-up ? ?Lab Results  ?Component Value Date  ? HGBA1C 5.8 (H) 05/07/2021  ? HGBA1C 5.6 01/22/2021  ? HGBA1C 5.7 (H) 09/25/2020  ? ?Last seen for diabetes 3 months ago.  ?Management includes Rybelsus 7 mg daily ?She reports excellent compliance with treatment. ?She is not having side effects.  ? ?Home blood sugar records: fasting range: 99-115 ? ?Most Recent Eye Exam: 08/05/2021 ? ?--------------------------------------------------------------------------------------------------- ?Hypertension, follow-up ? ?BP Readings from Last 3 Encounters:  ?05/07/21 120/60  ?03/11/21 128/80  ?01/22/21 128/72  ? Wt Readings from Last 3 Encounters:  ?05/07/21 200 lb 6.4 oz (90.9 kg)  ?03/11/21 203 lb (92.1 kg)  ?01/22/21 204 lb (92.5 kg)  ?  ? ?She was last seen for hypertension 3 months ago.  ?BP at that visit was 120/60. ?Management includes Altace 2.5 mg. ?She reports excellent compliance with treatment. ?She is not having side effects.  ?She is not exercising. ?She is adherent to low salt diet.   ? ? ?She does not smoke. ? ?Use of agents associated with hypertension: NSAIDS.  ? ?--------------------------------------------------------------------------------------------------- ?Lipid/Cholesterol, follow-up ? ?Last Lipid Panel: ?Lab Results  ?Component Value Date  ? CHOL 250 (H) 05/07/2021  ? LDLCALC 156 (H) 05/07/2021  ? HDL 52 05/07/2021  ? TRIG 227 (H) 05/07/2021  ? ? ?She was last seen for this 3 months ago.  ?Management includes  continue Crestor 20 mg. ? ?She reports excellent compliance with treatment. ?She is not having side effects.  ? ?She is following a Low Sodium diet. ?Current exercise: none ? ?Last metabolic panel ?Lab Results  ?Component Value Date  ? GLUCOSE 97 05/07/2021  ? NA 139 05/07/2021  ? K 5.3 (H) 05/07/2021  ? BUN 15 05/07/2021  ? CREATININE 0.85 05/07/2021  ? GFRNONAA 53 (L) 09/18/2020  ? GFRAA 77 03/24/2020  ? CALCIUM 9.9 05/07/2021  ? AST 15 05/07/2021  ? ALT 8 05/07/2021  ? ? ? ?RLS: ?Controlled and taking requip 3 mg daily. ? ?Neuropathy: ?Treated with Lyrica 100 mg BID ? ?--------------------------------------------------------------------------------------------------- ? ?Current Outpatient Medications on File Prior to Visit  ?Medication Sig Dispense Refill  ? ASPIRIN 81 PO Take 81 mg by mouth daily.    ? Blood Glucose Monitoring Suppl (ONETOUCH VERIO) w/Device KIT Use daily to check FBS 1 kit 0  ? budesonide-formoterol (SYMBICORT) 160-4.5 MCG/ACT inhaler Inhale 2 puffs into the lungs 2 (two) times daily. 1 each 3  ? Cyanocobalamin 2500 MCG SUBL Place 1 tablet (2,500 mcg total) under the tongue daily. 90 tablet 1  ? dicyclomine (BENTYL) 20 MG tablet Take 1 tablet (20 mg total) by mouth every 6 (six) hours. 120 tablet 2  ? eszopiclone (LUNESTA) 2 MG TABS tablet TAKE ONE TABLET BY MOUTH EVERYDAY AT BEDTIME AS NEEDED FOR SLEEP. take immediately BEFORE bedtime 30 tablet 0  ? fluticasone (FLONASE) 50 MCG/ACT nasal spray 2 sprays each nostril once daliy 48 mL 2  ? glucose blood (ONETOUCH VERIO) test strip E11.42 Use new test  strip each time when checking FBS 100 each 2  ? Lancets (ONETOUCH DELICA PLUS [id number]) MISC E 11.42 Use new lancet each time when checking FBS 100 each 2  ? loratadine (CLARITIN) 10 MG tablet Take 1 tablet (10 mg total) by mouth daily. 30 tablet 11  ? Multiple Vitamin (MULTIVITAMIN WITH MINERALS) TABS tablet Take 1 tablet by mouth daily.    ? omeprazole (PRILOSEC) 20 MG capsule Take 1 capsule (20  mg total) by mouth daily. 90 capsule 1  ? ondansetron (ZOFRAN) 4 MG tablet TAKE ONE TABLET BY MOUTH every SIX hours AS NEEDED FOR NAUSEA AND VOMITING 90 tablet 1  ? phentermine 15 MG capsule TAKE ONE CAPSULE BY MOUTH EVERY MORNING 30 capsule 2  ? pregabalin (LYRICA) 100 MG capsule Take 1 capsule (100 mg total) by mouth 2 (two) times daily. 180 capsule 0  ? ramipril (ALTACE) 2.5 MG capsule Take 1 capsule (2.5 mg total) by mouth daily. 90 capsule 1  ? rOPINIRole (REQUIP) 3 MG tablet TAKE 1 TABLET BY MOUTH ONCE DAILY 1-3 HOURS BEFORE  BEDTIME 90 tablet 1  ? rosuvastatin (CRESTOR) 20 MG tablet Take 1 tablet (20 mg total) by mouth daily. 90 tablet 1  ? Semaglutide (RYBELSUS) 7 MG TABS Take 7 mg by mouth daily. 30 tablet 2  ? solifenacin (VESICARE) 5 MG tablet Take 1 tablet (5 mg total) by mouth daily. 90 tablet 1  ? topiramate (TOPAMAX) 50 MG tablet Take 1 tablet (50 mg total) by mouth daily. 90 tablet 1  ? traZODone (DESYREL) 50 MG tablet TAKE 1/2 TO 1 TAB BY MOUTH AT bedtime AS NEEDED FOR SLEEP (Patient not taking: Reported on 05/11/2021) 90 tablet 1  ? triamcinolone cream (KENALOG) 0.1 % APPLY ONE application TOPICALLY TWICE DAILY 80 g 3  ? vitamin B-12 1000 MCG tablet Take 1 tablet (1,000 mcg total) by mouth daily. 30 tablet 0  ? vortioxetine HBr (TRINTELLIX) 20 MG TABS tablet Take 1 tablet (20 mg total) by mouth daily. 90 tablet 1  ? ?No current facility-administered medications on file prior to visit.  ? ?Past Medical History:  ?Diagnosis Date  ? 2019 novel coronavirus disease (COVID-19) 12/31/2018  ? Acute hypoxemic respiratory failure due to COVID-19 [facility])   ? Chicken pox   ? Depression   ? Diabetes ([place])   ? GERD (gastroesophageal reflux disease)   ? Hypertension   ? Idiopathic progressive neuropathy   ? Major depressive disorder   ? Mixed hyperlipidemia   ? Neuropathy   ? Osteoarthritis   ? Pneumonia   ? Primary insomnia   ? Primary osteoarthritis of left knee 05/31/2019  ? RLS (restless legs syndrome)   ? S/P  total knee replacement 07/15/2019  ? Status post total knee replacement 07/25/2019  ? Tachycardia   ? Urge incontinence   ? UTI (lower urinary tract infection)   ? Weakness   ? ?Past Surgical History:  ?Procedure Laterality Date  ? ABDOMINAL HYSTERECTOMY    ? APPENDECTOMY    ? CHOLECYSTECTOMY    ? HERNIA REPAIR    ? REPLACEMENT TOTAL KNEE Right   ? TONSILLECTOMY    ? TOTAL KNEE ARTHROPLASTY Left 07/15/2019  ? Procedure: TOTAL KNEE ARTHROPLASTY;  Surgeon: Vickey Huger, MD;  Location: [facility] ORS;  Service: Orthopedics;  Laterality: Left;  ?  ?Family History  ?Problem Relation Age of Onset  ? Thyroid disease Mother   ? Cancer Mother   ? Migraines Mother   ? Brain cancer Father   ?  Heart disease Maternal Grandmother   ? Diabetes Daughter   ? ?Social History  ? ?Socioeconomic History  ? Marital status: Married  ?  Spouse name: Not on file  ? Number of children: 3  ? Years of education: 72  ? Highest education level: Not on file  ?Occupational History  ? Occupation: Housewife  ?Tobacco Use  ? Smoking status: Former  ?  Packs/day: 2.00  ?  Years: 12.00  ?  Pack years: 24.00  ?  Types: Cigarettes  ?  Quit date: 05/25/2010  ?  Years since quitting: 11.2  ? Smokeless tobacco: Never  ?Vaping Use  ? Vaping Use: Never used  ?Substance and Sexual Activity  ? Alcohol use: Yes  ?  Comment: occasional  ? Drug use: No  ? Sexual activity: Not on file  ?Other Topics Concern  ? Not on file  ?Social History Narrative  ? Born and raised in [place], [place]. Currently lives in a house with her husband. 1 dog. Fun: Garden, feed birds, swimming  ? Denies any religious beliefs effecting health care.   ? ?Social Determinants of Health  ? ?Financial Resource Strain: Not on file  ?Food Insecurity: Not on file  ?Transportation Needs: Not on file  ?Physical Activity: Not on file  ?Stress: Not on file  ?Social Connections: Not on file  ? ? ?Review of Systems  ?Constitutional:  Negative for appetite change, fatigue and unexpected weight change.  ?HENT:  Negative  for congestion, ear pain, rhinorrhea, sinus pressure, sinus pain and tinnitus.   ?Eyes:  Negative for pain.  ?Respiratory:  Negative for cough and shortness of breath.   ?Cardiovascular:  Negative for chest p

## 2021-08-11 ENCOUNTER — Telehealth: Payer: Self-pay

## 2021-08-11 LAB — LIPID PANEL
Chol/HDL Ratio: 5.2 ratio — ABNORMAL HIGH (ref 0.0–4.4)
Cholesterol, Total: 245 mg/dL — ABNORMAL HIGH (ref 100–199)
HDL: 47 mg/dL (ref >39)
LDL Chol Calc (NIH): 160 mg/dL — ABNORMAL HIGH (ref 0–99)
Triglycerides: 206 mg/dL — ABNORMAL HIGH (ref 0–149)
VLDL Cholesterol Cal: 38 mg/dL (ref 5–40)

## 2021-08-11 LAB — CBC WITH DIFFERENTIAL/PLATELET
Basophils Absolute: 0 10*3/uL (ref 0.0–0.2)
Basos: 1 %
EOS (ABSOLUTE): 0.1 10*3/uL (ref 0.0–0.4)
Eos: 1 %
Hematocrit: 43.3 % (ref 34.0–46.6)
Hemoglobin: 14.4 g/dL (ref 11.1–15.9)
Immature Grans (Abs): 0 10*3/uL (ref 0.0–0.1)
Immature Granulocytes: 0 %
Lymphocytes Absolute: 2 10*3/uL (ref 0.7–3.1)
Lymphs: 30 %
MCH: 29.4 pg (ref 26.6–33.0)
MCHC: 33.3 g/dL (ref 31.5–35.7)
MCV: 88 fL (ref 79–97)
Monocytes Absolute: 0.5 10*3/uL (ref 0.1–0.9)
Monocytes: 7 %
Neutrophils Absolute: 4.1 10*3/uL (ref 1.4–7.0)
Neutrophils: 61 %
Platelets: 283 10*3/uL (ref 150–450)
RBC: 4.9 x10E6/uL (ref 3.77–5.28)
RDW: 12.9 % (ref 11.7–15.4)
WBC: 6.7 10*3/uL (ref 3.4–10.8)

## 2021-08-11 LAB — HEMOGLOBIN A1C
Est. average glucose Bld gHb Est-mCnc: 120 mg/dL
Hgb A1c MFr Bld: 5.8 % — ABNORMAL HIGH (ref 4.8–5.6)

## 2021-08-11 LAB — CARDIOVASCULAR RISK ASSESSMENT

## 2021-08-11 LAB — COMPREHENSIVE METABOLIC PANEL
ALT: 6 IU/L (ref 0–32)
AST: 15 IU/L (ref 0–40)
Albumin/Globulin Ratio: 1.9 (ref 1.2–2.2)
Albumin: 4.3 g/dL (ref 3.8–4.8)
Alkaline Phosphatase: 97 IU/L (ref 44–121)
BUN/Creatinine Ratio: 13 (ref 12–28)
BUN: 10 mg/dL (ref 8–27)
Bilirubin Total: 0.3 mg/dL (ref 0.0–1.2)
CO2: 23 mmol/L (ref 20–29)
Calcium: 9.4 mg/dL (ref 8.7–10.3)
Chloride: 98 mmol/L (ref 96–106)
Creatinine, Ser: 0.78 mg/dL (ref 0.57–1.00)
Globulin, Total: 2.3 g/dL (ref 1.5–4.5)
Glucose: 97 mg/dL (ref 70–99)
Potassium: 4.6 mmol/L (ref 3.5–5.2)
Sodium: 135 mmol/L (ref 134–144)
Total Protein: 6.6 g/dL (ref 6.0–8.5)
eGFR: 82 mL/min/{1.73_m2} (ref >59)

## 2021-08-11 NOTE — Progress Notes (Signed)
Chronic Care Management Pharmacy Assistant   Name: Hailey Greene  MRN: [id number] DOB: 10/27/1950   Reason for Encounter: Disease State call for Lipids   Recent office visits:  08/10/21 Jerrell Belfast NP. Seen for routine visit. No med changes.   Recent consult visits:  None  Hospital visits:  None  Medications: Outpatient Encounter Medications as of 08/11/2021  Medication Sig   ASPIRIN 81 PO Take 81 mg by mouth daily.   blood glucose meter kit and supplies Dispense based on patient and insurance preference. Use up to four times daily as directed. (FOR ICD-10 E10.9, E11.9).   Blood Glucose Monitoring Suppl (ONETOUCH VERIO) w/Device KIT Use daily to check FBS   budesonide-formoterol (SYMBICORT) 160-4.5 MCG/ACT inhaler Inhale 2 puffs into the lungs 2 (two) times daily.   Cyanocobalamin 2500 MCG SUBL Place 1 tablet (2,500 mcg total) under the tongue daily.   dicyclomine (BENTYL) 20 MG tablet Take 1 tablet (20 mg total) by mouth every 6 (six) hours.   eszopiclone (LUNESTA) 2 MG TABS tablet TAKE ONE TABLET BY MOUTH EVERYDAY AT BEDTIME AS NEEDED FOR SLEEP. take immediately BEFORE bedtime   fluticasone (FLONASE) 50 MCG/ACT nasal spray 2 sprays each nostril once daliy   glucose blood (ONETOUCH VERIO) test strip E11.42 Use new test strip each time when checking FBS   Lancets (ONETOUCH DELICA PLUS [id number]) MISC E 11.42 Use new lancet each time when checking FBS   loratadine (CLARITIN) 10 MG tablet Take 1 tablet (10 mg total) by mouth daily.   Multiple Vitamin (MULTIVITAMIN WITH MINERALS) TABS tablet Take 1 tablet by mouth daily.   omeprazole (PRILOSEC) 20 MG capsule Take 1 capsule (20 mg total) by mouth daily.   ondansetron (ZOFRAN) 4 MG tablet TAKE ONE TABLET BY MOUTH every SIX hours AS NEEDED FOR NAUSEA AND VOMITING   phentermine 15 MG capsule Take 1 capsule (15 mg total) by mouth every morning.   pregabalin (LYRICA) 100 MG capsule Take 1 capsule (100 mg total) by mouth 2 (two)  times daily.   ramipril (ALTACE) 2.5 MG capsule Take 1 capsule (2.5 mg total) by mouth daily.   rOPINIRole (REQUIP) 3 MG tablet TAKE 1 TABLET BY MOUTH ONCE DAILY 1-3 HOURS BEFORE  BEDTIME   rosuvastatin (CRESTOR) 20 MG tablet Take 1 tablet (20 mg total) by mouth daily.   Semaglutide (RYBELSUS) 7 MG TABS Take 7 mg by mouth daily.   solifenacin (VESICARE) 5 MG tablet Take 1 tablet (5 mg total) by mouth daily.   topiramate (TOPAMAX) 50 MG tablet Take 1 tablet (50 mg total) by mouth daily.   traZODone (DESYREL) 50 MG tablet TAKE 1/2 TO 1 TAB BY MOUTH AT bedtime AS NEEDED FOR SLEEP (Patient not taking: Reported on 05/11/2021)   triamcinolone cream (KENALOG) 0.1 % APPLY ONE application TOPICALLY TWICE DAILY   vitamin B-12 1000 MCG tablet Take 1 tablet (1,000 mcg total) by mouth daily.   vortioxetine HBr (TRINTELLIX) 20 MG TABS tablet Take 1 tablet (20 mg total) by mouth daily.   No facility-administered encounter medications on file as of 08/11/2021.    Lipid Panel    Component Value Date/Time   CHOL 245 (H) 08/10/2021 0941   TRIG 206 (H) 08/10/2021 0941   HDL 47 08/10/2021 0941   LDLCALC 160 (H) 08/10/2021 0941    10-year ASCVD risk score: The 10-year ASCVD risk score (Arnett DK, et al., 2019) is: 26.3%   Values used to calculate the score:  Age: 71 years     Sex: Female     Is Non-Hispanic African American: No     Diabetic: Yes     Tobacco smoker: No     Systolic Blood Pressure: 579 mmHg     Is BP treated: Yes     HDL Cholesterol: 47 mg/dL     Total Cholesterol: 245 mg/dL  Current antihyperlipidemic regimen:  Rosuvastatin 60m  daily  ASCVD risk enhancing conditions: age >>53 DM, and HTN  What recent interventions/DTPs have been made by any provider to improve Cholesterol control since last CPP Visit: Pt denies any changes   Any recent hospitalizations or ED visits since last visit with CPP? No  What diet changes have been made to improve Cholesterol?  Pt is watching what she  eats and has lost 4lbs   What exercise is being done to improve Cholesterol?  Pt is still walking daily   Adherence Review: Does the patient have >5 day gap between last estimated fill dates? No  Star Rating Drugs Medication Name Last Fill Days supply Rosuvastatin               06/11/21            75ds  Care Gaps: Last annual wellness visit?03/11/21  DElray Mcgregor CRiverviewPharmacist Assistant  [phone]

## 2021-08-17 DIAGNOSIS — G4733 Obstructive sleep apnea (adult) (pediatric): Secondary | ICD-10-CM | POA: Diagnosis not present

## 2021-08-19 ENCOUNTER — Other Ambulatory Visit: Payer: Self-pay

## 2021-08-19 ENCOUNTER — Other Ambulatory Visit: Payer: Self-pay | Admitting: Nurse Practitioner

## 2021-08-19 MED ORDER — ROSUVASTATIN CALCIUM 40 MG PO TABS: 40.0000 mg | ORAL_TABLET | Freq: Every day | ORAL | 0 refills | Status: DC

## 2021-08-19 MED ORDER — ICOSAPENT ETHYL 1 G PO CAPS: 2.0000 g | ORAL_CAPSULE | Freq: Two times a day (BID) | ORAL | 0 refills | Status: DC

## 2021-08-26 ENCOUNTER — Ambulatory Visit: Payer: Medicare Other | Admitting: Family Medicine

## 2021-09-01 ENCOUNTER — Ambulatory Visit (INDEPENDENT_AMBULATORY_CARE_PROVIDER_SITE_OTHER): Payer: Medicare Other | Admitting: Family Medicine

## 2021-09-01 VITALS — BP 120/64 | HR 76 | Temp 96.1°F | Resp 14 | Ht 63.0 in | Wt 199.0 lb

## 2021-09-01 DIAGNOSIS — R4781 Slurred speech: Secondary | ICD-10-CM | POA: Diagnosis not present

## 2021-09-01 DIAGNOSIS — G603 Idiopathic progressive neuropathy: Secondary | ICD-10-CM

## 2021-09-01 DIAGNOSIS — R531 Weakness: Secondary | ICD-10-CM | POA: Diagnosis not present

## 2021-09-01 DIAGNOSIS — E1142 Type 2 diabetes mellitus with diabetic polyneuropathy: Secondary | ICD-10-CM

## 2021-09-01 MED ORDER — PREGABALIN 200 MG PO CAPS: 200.0000 mg | ORAL_CAPSULE | Freq: Two times a day (BID) | ORAL | 3 refills | Status: DC

## 2021-09-01 NOTE — Progress Notes (Addendum)
Subjective:  Patient ID: Hailey Greene, female    DOB: 08-25-1950  Age: 71 y.o. MRN: [id number]  Chief Complaint  Patient presents with   Leg Pain    Powered wheel chair visit    Leg Pain    Evaluation for power scooter:  The patient presents today for a mobility evaluation. The patient is able to perform the following Mobility-Related Activities of Daily Living (MRADLs) safely, but not within a normal time period at home: bathing, grooming, dressing, feeding, toileting, moving from room to room, meal prep, and home management. The patient often walks with a walker. Does not have a manual wheelchair. The patient has the ability to move Ambulation distance (100 feet using the assistive devices. Her gait pattern is slow and is antalgic gait. The patient has the ability to stand from a seated position without assistance. The patient has the physical and mental abilities to operate a power scooter in his/her home. The patient is willing and motivated to use a power scooter in the home and outside of home.   Current Outpatient Medications on File Prior to Visit  Medication Sig Dispense Refill   ASPIRIN 81 PO Take 81 mg by mouth daily.     blood glucose meter kit and supplies Dispense based on patient and insurance preference. Use up to four times daily as directed. (FOR ICD-10 E10.9, E11.9). 1 each 0   Blood Glucose Monitoring Suppl (ONETOUCH VERIO) w/Device KIT Use daily to check FBS 1 kit 0   budesonide-formoterol (SYMBICORT) 160-4.5 MCG/ACT inhaler Inhale 2 puffs into the lungs 2 (two) times daily. 1 each 3   Cyanocobalamin 2500 MCG SUBL Place 1 tablet (2,500 mcg total) under the tongue daily. 90 tablet 1   dicyclomine (BENTYL) 20 MG tablet Take 1 tablet (20 mg total) by mouth every 6 (six) hours. 120 tablet 2   fluticasone (FLONASE) 50 MCG/ACT nasal spray 2 sprays each nostril once daliy 48 mL 2   glucose blood (ONETOUCH VERIO) test strip E11.42 Use new test strip each time when checking  FBS 100 each 2   icosapent Ethyl (VASCEPA) 1 g capsule Take 2 capsules (2 g total) by mouth 2 (two) times daily. 360 capsule 0   Lancets (ONETOUCH DELICA PLUS YBOFBP10C) MISC E 11.42 Use new lancet each time when checking FBS 100 each 2   loratadine (CLARITIN) 10 MG tablet Take 1 tablet (10 mg total) by mouth daily. 30 tablet 11   Multiple Vitamin (MULTIVITAMIN WITH MINERALS) TABS tablet Take 1 tablet by mouth daily.     omeprazole (PRILOSEC) 20 MG capsule Take 1 capsule (20 mg total) by mouth daily. 90 capsule 1   ondansetron (ZOFRAN) 4 MG tablet TAKE ONE TABLET BY MOUTH every SIX hours AS NEEDED FOR NAUSEA AND VOMITING 90 tablet 1   phentermine 15 MG capsule Take 1 capsule (15 mg total) by mouth every morning. 30 capsule 2   ramipril (ALTACE) 2.5 MG capsule Take 1 capsule (2.5 mg total) by mouth daily. 90 capsule 1   rOPINIRole (REQUIP) 3 MG tablet TAKE 1 TABLET BY MOUTH ONCE DAILY 1-3 HOURS BEFORE  BEDTIME 90 tablet 1   rosuvastatin (CRESTOR) 40 MG tablet Take 1 tablet (40 mg total) by mouth daily. 90 tablet 0   Semaglutide (RYBELSUS) 7 MG TABS Take 7 mg by mouth daily. 30 tablet 2   solifenacin (VESICARE) 5 MG tablet Take 1 tablet (5 mg total) by mouth daily. 90 tablet 1   topiramate (TOPAMAX) 50  MG tablet Take 1 tablet (50 mg total) by mouth daily. 90 tablet 1   traZODone (DESYREL) 50 MG tablet TAKE 1/2 TO 1 TAB BY MOUTH AT bedtime AS NEEDED FOR SLEEP 90 tablet 1   vitamin B-12 1000 MCG tablet Take 1 tablet (1,000 mcg total) by mouth daily. 30 tablet 0   vortioxetine HBr (TRINTELLIX) 20 MG TABS tablet Take 1 tablet (20 mg total) by mouth daily. 90 tablet 1   No current facility-administered medications on file prior to visit.   Past Medical History:  Diagnosis Date   2019 novel coronavirus disease (COVID-19) 12/31/2018   Acute hypoxemic respiratory failure due to COVID-19 (HCC)    Chicken pox    Depression    Diabetes (HCC)    GERD (gastroesophageal reflux disease)    Hypertension     Idiopathic progressive neuropathy    Major depressive disorder    Mixed hyperlipidemia    Neuropathy    Osteoarthritis    Pneumonia    Primary insomnia    Primary osteoarthritis of left knee 05/31/2019   RLS (restless legs syndrome)    S/P total knee replacement 07/15/2019   Status post total knee replacement 07/25/2019   Tachycardia    Urge incontinence    UTI (lower urinary tract infection)    Weakness    Past Surgical History:  Procedure Laterality Date   ABDOMINAL HYSTERECTOMY     APPENDECTOMY     CHOLECYSTECTOMY     HERNIA REPAIR     REPLACEMENT TOTAL KNEE Right    TONSILLECTOMY     TOTAL KNEE ARTHROPLASTY Left 07/15/2019   Procedure: TOTAL KNEE ARTHROPLASTY;  Surgeon: Vickey Huger, MD;  Location: [facility];  Service: Orthopedics;  Laterality: Left;    Family History  Problem Relation Age of Onset   Thyroid disease Mother    Cancer Mother    Migraines Mother    Brain cancer Father    Heart disease Maternal Grandmother    Diabetes Daughter    Social History   Socioeconomic History   Marital status: Married    Spouse name: Not on file   Number of children: 3   Years of education: 12   Highest education level: Not on file  Occupational History   Occupation: Housewife  Tobacco Use   Smoking status: Former    Packs/day: 2.00    Years: 12.00    Total pack years: 24.00    Types: Cigarettes    Quit date: 05/25/2010    Years since quitting: 11.2   Smokeless tobacco: Never  Vaping Use   Vaping Use: Never used  Substance and Sexual Activity   Alcohol use: Yes    Comment: occasional   Drug use: No   Sexual activity: Not on file  Other Topics Concern   Not on file  Social History Narrative   Born and raised in [place], [place]. Currently lives in a house with her husband. 1 dog. Fun: Garden, feed birds, swimming   Denies any religious beliefs effecting health care.    Social Determinants of Health   Financial Resource Strain: High Risk (02/12/2020)   Overall Financial  Resource Strain (CARDIA)    Difficulty of Paying Living Expenses: Hard  Food Insecurity: No Food Insecurity (01/21/2020)   Hunger Vital Sign    Worried About Running Out of Food in the Last Year: Never true    Ran Out of Food in the Last Year: Never true  Transportation Needs: No Transportation Needs (02/12/2020)  PRAPARE - Hydrologist (Medical): No    Lack of Transportation (Non-Medical): No  Physical Activity: Insufficiently Active (02/12/2020)   Exercise Vital Sign    Days of Exercise per Week: 7 days    Minutes of Exercise per Session: 20 min  Stress: Not on file  Social Connections: Not on file    Review of Systems  Constitutional:  Negative for chills, fatigue and fever.  HENT:  Negative for congestion, ear pain and sore throat.   Respiratory:  Negative for cough and shortness of breath.   Cardiovascular:  Negative for chest pain.  Gastrointestinal:  Negative for abdominal pain, constipation, diarrhea, nausea and vomiting.  Genitourinary:  Negative for dysuria and urgency.  Musculoskeletal:  Negative for arthralgias and myalgias.       Severe idiopathic neuropathy.   Skin:  Negative for rash.  Neurological:  Negative for dizziness and headaches.       Occasional slurred speech she reports. Left-sided weakness which has occurred over the last 3 months.  Psychiatric/Behavioral:  Negative for dysphoric mood. The patient is not nervous/anxious.      Objective:  BP 120/64   Pulse 76   Temp (!) 96.1 F (35.6 C)   Resp 14   Ht _0  (1.6 m)   Wt 199 lb (90.3 kg)   BMI 35.25 kg/m      09/01/2021    9:00 AM 08/10/2021    8:57 AM 05/07/2021    9:37 AM  BP/Weight  Systolic BP 832 919 166  Diastolic BP 64 72 60  Wt. (Lbs) 199 197 200.4  BMI 35.25 kg/m2 34.9 kg/m2 36.65 kg/m2    Physical Exam Vitals reviewed.  Constitutional:      Appearance: Normal appearance. She is normal weight.  Neck:     Vascular: No carotid bruit.   Cardiovascular:     Rate and Rhythm: Normal rate and regular rhythm.     Heart sounds: Normal heart sounds.  Pulmonary:     Effort: Pulmonary effort is normal. No respiratory distress.     Breath sounds: Normal breath sounds.  Abdominal:     General: Abdomen is flat. Bowel sounds are normal.     Palpations: Abdomen is soft.     Tenderness: There is no abdominal tenderness.  Neurological:     Mental Status: She is alert and oriented to person, place, and time.     Cranial Nerves: Cranial nerves 2-12 are intact.     Motor: Weakness (4/5 left arm and left leg.  5/5 right arm and right leg.) present.  Psychiatric:        Mood and Affect: Mood normal.        Behavior: Behavior normal.     Diabetic Foot Exam - Simple   No data filed      Lab Results  Component Value Date   WBC 6.7 08/10/2021   HGB 14.4 08/10/2021   HCT 43.3 08/10/2021   PLT 283 08/10/2021   GLUCOSE 97 08/10/2021   CHOL 245 (H) 08/10/2021   TRIG 206 (H) 08/10/2021   HDL 47 08/10/2021   LDLCALC 160 (H) 08/10/2021   ALT 6 08/10/2021   AST 15 08/10/2021   NA 135 08/10/2021   K 4.6 08/10/2021   CL 98 08/10/2021   CREATININE 0.78 08/10/2021   BUN 10 08/10/2021   CO2 23 08/10/2021   TSH 1.400 01/22/2021   HGBA1C 5.8 (H) 08/10/2021   MICROALBUR 30 09/25/2020  Assessment & Plan:   Problem List Items Addressed This Visit       Endocrine   Diabetic polyneuropathy associated with type 2 diabetes mellitus ([place])    Well-controlled except neuropathy is bothersome.      Relevant Medications   pregabalin (LYRICA) 200 MG capsule     Nervous and Auditory   Idiopathic progressive neuropathy    Increase Lyrica to 200 mg twice daily.      Relevant Medications   pregabalin (LYRICA) 200 MG capsule   Left-sided weakness - Primary    Check MRI of brain.  Rule out stroke. Ordering a power scooter.      Relevant Orders   MR Brain Wo Contrast (Completed)     Other   Slurred speech    Check MRI of  brain.      Relevant Orders   MR Brain Wo Contrast (Completed)  .  Meds ordered this encounter  Medications   pregabalin (LYRICA) 200 MG capsule    Sig: Take 1 capsule (200 mg total) by mouth 2 (two) times daily.    Dispense:  60 capsule    Refill:  3    Orders Placed This Encounter  Procedures   MR Brain Wo Contrast    Instructed patient request paper work: Mobility folding scooter with remote [phone].  Follow-up: Return in about 2 weeks (around 09/15/2021) for chronic fasting.  An After Visit Summary was printed and given to the patient.  Rochel Brome, MD [facility] [phone]

## 2021-09-02 ENCOUNTER — Other Ambulatory Visit: Payer: Self-pay | Admitting: Family Medicine

## 2021-09-02 ENCOUNTER — Other Ambulatory Visit: Payer: Self-pay | Admitting: Physician Assistant

## 2021-09-02 ENCOUNTER — Ambulatory Visit
Admission: RE | Admit: 2021-09-02 | Discharge: 2021-09-02 | Disposition: A | Discharge status: Home / Self Care | Payer: Medicare Other | Source: Ambulatory Visit | Attending: Family Medicine | Admitting: Family Medicine

## 2021-09-02 DIAGNOSIS — R29818 Other symptoms and signs involving the nervous system: Secondary | ICD-10-CM | POA: Diagnosis not present

## 2021-09-02 DIAGNOSIS — L2089 Other atopic dermatitis: Secondary | ICD-10-CM

## 2021-09-02 DIAGNOSIS — R531 Weakness: Secondary | ICD-10-CM

## 2021-09-02 DIAGNOSIS — R4781 Slurred speech: Secondary | ICD-10-CM

## 2021-09-02 DIAGNOSIS — I619 Nontraumatic intracerebral hemorrhage, unspecified: Secondary | ICD-10-CM | POA: Diagnosis not present

## 2021-09-05 ENCOUNTER — Encounter: Payer: Self-pay | Admitting: Family Medicine

## 2021-09-05 NOTE — Assessment & Plan Note (Signed)
Well-controlled except neuropathy is bothersome.

## 2021-09-05 NOTE — Assessment & Plan Note (Addendum)
Check MRI of brain.  Rule out stroke. Ordering a power scooter.

## 2021-09-05 NOTE — Assessment & Plan Note (Signed)
Check MRI of brain.

## 2021-09-05 NOTE — Assessment & Plan Note (Signed)
Increase Lyrica to 200 mg twice daily.

## 2021-09-15 ENCOUNTER — Telehealth: Payer: Self-pay

## 2021-09-15 NOTE — Telephone Encounter (Signed)
Patient is requesting a handicap placard paper be filled out, please. States she has had one in the past however it has expired.

## 2021-09-16 NOTE — Telephone Encounter (Signed)
Form filled out and left for signature!

## 2021-09-17 DIAGNOSIS — G4733 Obstructive sleep apnea (adult) (pediatric): Secondary | ICD-10-CM | POA: Diagnosis not present

## 2021-09-24 ENCOUNTER — Telehealth: Payer: Self-pay

## 2021-09-24 NOTE — Telephone Encounter (Signed)
Hailey Greene called about getting her motorized scooter.  She wanted to check on the status of the order and she also wants to get a handicap placard.  Will mail the form for the placard.  Dr. Sedalia Muta will check on scooter order.

## 2021-09-27 ENCOUNTER — Other Ambulatory Visit: Payer: Self-pay | Admitting: Physician Assistant

## 2021-09-27 DIAGNOSIS — R110 Nausea: Secondary | ICD-10-CM

## 2021-10-04 ENCOUNTER — Other Ambulatory Visit: Payer: Self-pay | Admitting: Family Medicine

## 2021-10-04 NOTE — Telephone Encounter (Signed)
Phone call was disconnected.

## 2021-10-05 ENCOUNTER — Telehealth: Payer: Self-pay

## 2021-10-05 NOTE — Telephone Encounter (Signed)
Patient would like cologuard order. States one was previously ordered for her a couple years back but she does not believe she completed.   Do not see any cologuard results in Epic. Unsure if patient is still a candidate or if one was completed.

## 2021-10-05 NOTE — Telephone Encounter (Addendum)
Patient called with other concerns. Patient does not have a preference of company.   Hailey Greene 10/05/21 8:24 AM  Patient returned call to give list, as follows:  Deluxe Ozella Almond- foldable scooter [employer] [facility]  [place], [place] 10/05/21 8:44 AM

## 2021-10-08 ENCOUNTER — Telehealth: Payer: Self-pay

## 2021-10-08 NOTE — Telephone Encounter (Signed)
Early refill approved per Dr. Sedalia Muta for Hailey Greene.

## 2021-10-11 NOTE — Telephone Encounter (Signed)
Done and faxed to [facility] cause other places dont take insurance.

## 2021-10-14 ENCOUNTER — Other Ambulatory Visit: Payer: Self-pay

## 2021-10-14 DIAGNOSIS — Z1211 Encounter for screening for malignant neoplasm of colon: Secondary | ICD-10-CM

## 2021-10-17 DIAGNOSIS — G4733 Obstructive sleep apnea (adult) (pediatric): Secondary | ICD-10-CM | POA: Diagnosis not present

## 2021-10-20 ENCOUNTER — Ambulatory Visit: Payer: Medicare Other

## 2021-10-29 ENCOUNTER — Other Ambulatory Visit: Payer: Self-pay | Admitting: Family Medicine

## 2021-10-29 ENCOUNTER — Other Ambulatory Visit: Payer: Self-pay

## 2021-10-29 ENCOUNTER — Telehealth: Payer: Self-pay

## 2021-10-29 DIAGNOSIS — E6609 Other obesity due to excess calories: Secondary | ICD-10-CM

## 2021-10-29 MED ORDER — PHENTERMINE HCL 15 MG PO CAPS: 15.0000 mg | ORAL_CAPSULE | Freq: Every morning | ORAL | 2 refills | Status: DC

## 2021-10-29 MED ORDER — ESZOPICLONE 3 MG PO TABS
3.0000 mg | ORAL_TABLET | Freq: Every day | ORAL | 1 refills | Status: DC
Start: 2021-10-29 — End: 2021-11-22

## 2021-10-29 NOTE — Telephone Encounter (Signed)
Patient stated she would like to go up on the mg for Lunesta, because it's not helping her sleep much. Please advise

## 2021-10-29 NOTE — Telephone Encounter (Signed)
Patient is requesting copy of script for motorized scooter. She has found somewhere she can get this without ordering.   Lorita Officer, CCMA 10/29/21 11:24 AM

## 2021-11-01 ENCOUNTER — Other Ambulatory Visit: Payer: Self-pay

## 2021-11-01 NOTE — Telephone Encounter (Signed)
[facility] called to clarify dosage of lunesta.  Dose has been increased from 2-3 mg.

## 2021-11-03 ENCOUNTER — Telehealth: Payer: Self-pay

## 2021-11-03 ENCOUNTER — Other Ambulatory Visit: Payer: Self-pay

## 2021-11-03 DIAGNOSIS — J4520 Mild intermittent asthma, uncomplicated: Secondary | ICD-10-CM

## 2021-11-03 DIAGNOSIS — E1142 Type 2 diabetes mellitus with diabetic polyneuropathy: Secondary | ICD-10-CM

## 2021-11-03 DIAGNOSIS — G603 Idiopathic progressive neuropathy: Secondary | ICD-10-CM

## 2021-11-03 MED ORDER — TRAZODONE HCL 50 MG PO TABS
ORAL_TABLET | ORAL | 1 refills | Status: DC
Start: 2021-11-03 — End: 2021-11-23

## 2021-11-03 MED ORDER — VORTIOXETINE HBR 20 MG PO TABS
20.0000 mg | ORAL_TABLET | Freq: Every day | ORAL | 1 refills | Status: DC
Start: 2021-11-03 — End: 2022-05-02

## 2021-11-03 MED ORDER — PREGABALIN 200 MG PO CAPS: 200.0000 mg | ORAL_CAPSULE | Freq: Two times a day (BID) | ORAL | 1 refills | Status: DC

## 2021-11-03 MED ORDER — SOLIFENACIN SUCCINATE 5 MG PO TABS: 5.0000 mg | ORAL_TABLET | Freq: Every day | ORAL | 1 refills | Status: DC

## 2021-11-03 MED ORDER — BUDESONIDE-FORMOTEROL FUMARATE 160-4.5 MCG/ACT IN AERO: 2.0000 | INHALATION_SPRAY | Freq: Two times a day (BID) | RESPIRATORY_TRACT | 3 refills | Status: DC

## 2021-11-03 MED ORDER — TOPIRAMATE 50 MG PO TABS: 50.0000 mg | ORAL_TABLET | Freq: Every day | ORAL | 1 refills | Status: DC

## 2021-11-03 MED ORDER — ROPINIROLE HCL 3 MG PO TABS
ORAL_TABLET | ORAL | 1 refills | Status: DC
Start: 2021-11-03 — End: 2022-05-02

## 2021-11-03 MED ORDER — DICYCLOMINE HCL 20 MG PO TABS
20.0000 mg | ORAL_TABLET | Freq: Four times a day (QID) | ORAL | 1 refills | Status: DC
Start: 2021-11-03 — End: 2022-05-06

## 2021-11-03 MED ORDER — RYBELSUS 7 MG PO TABS: 7.0000 mg | ORAL_TABLET | Freq: Every day | ORAL | 1 refills | Status: DC

## 2021-11-03 NOTE — Progress Notes (Signed)
Chronic Care Management Pharmacy Assistant   Name: Hailey Greene  MRN: [id number] DOB: Jun 22, 1950   Reason for Encounter: Medication Coordination for [facility]    Recent office visits:  10/29/21 Rochel Brome MD. Orders Only. Increased Eszopiclone to 22m.   09/01/21 Cox, Kirsten MD. Seen for leg pain. Increased Pregabalin to 2026mtwice daily.  Recent consult visits:  None  Hospital visits:  None  Medications: Outpatient Encounter Medications as of 11/03/2021  Medication Sig   ASPIRIN 81 PO Take 81 mg by mouth daily.   blood glucose meter kit and supplies Dispense based on patient and insurance preference. Use up to four times daily as directed. (FOR ICD-10 E10.9, E11.9).   Blood Glucose Monitoring Suppl (ONETOUCH VERIO) w/Device KIT Use daily to check FBS   budesonide-formoterol (SYMBICORT) 160-4.5 MCG/ACT inhaler Inhale 2 puffs into the lungs 2 (two) times daily.   Cyanocobalamin 2500 MCG SUBL Place 1 tablet (2,500 mcg total) under the tongue daily.   dicyclomine (BENTYL) 20 MG tablet Take 1 tablet (20 mg total) by mouth every 6 (six) hours.   eszopiclone 3 MG TABS Take 1 tablet (3 mg total) by mouth at bedtime. Take immediately before bedtime   fluticasone (FLONASE) 50 MCG/ACT nasal spray 2 sprays each nostril once daliy   glucose blood (ONETOUCH VERIO) test strip E11.42 Use new test strip each time when checking FBS   icosapent Ethyl (VASCEPA) 1 g capsule Take 2 capsules (2 g total) by mouth 2 (two) times daily.   Lancets (ONETOUCH DELICA PLUS [id number] E 11.42 Use new lancet each time when checking FBS   loratadine (CLARITIN) 10 MG tablet Take 1 tablet (10 mg total) by mouth daily.   Multiple Vitamin (MULTIVITAMIN WITH MINERALS) TABS tablet Take 1 tablet by mouth daily.   omeprazole (PRILOSEC) 20 MG capsule Take 1 capsule (20 mg total) by mouth daily.   ondansetron (ZOFRAN) 4 MG tablet TAKE ONE TABLET BY MOUTH every SIX hours AS NEEDED FOR NAUSEA AND VOMITING    phentermine 15 MG capsule Take 1 capsule (15 mg total) by mouth every morning.   pregabalin (LYRICA) 200 MG capsule Take 1 capsule (200 mg total) by mouth 2 (two) times daily.   ramipril (ALTACE) 2.5 MG capsule Take 1 capsule (2.5 mg total) by mouth daily.   rOPINIRole (REQUIP) 3 MG tablet TAKE 1 TABLET BY MOUTH ONCE DAILY 1-3 HOURS BEFORE  BEDTIME   rosuvastatin (CRESTOR) 40 MG tablet Take 1 tablet (40 mg total) by mouth daily.   Semaglutide (RYBELSUS) 7 MG TABS Take 7 mg by mouth daily.   solifenacin (VESICARE) 5 MG tablet Take 1 tablet (5 mg total) by mouth daily.   topiramate (TOPAMAX) 50 MG tablet Take 1 tablet (50 mg total) by mouth daily.   traZODone (DESYREL) 50 MG tablet TAKE 1/2 TO 1 TAB BY MOUTH AT bedtime AS NEEDED FOR SLEEP   triamcinolone cream (KENALOG) 0.1 % apply one application TOPICALLY twice daily   vitamin B-12 1000 MCG tablet Take 1 tablet (1,000 mcg total) by mouth daily.   vortioxetine HBr (TRINTELLIX) 20 MG TABS tablet Take 1 tablet (20 mg total) by mouth daily.   No facility-administered encounter medications on file as of 11/03/2021.    Reviewed chart for medication changes ahead of medication coordination call.  No  Consults, or hospital visits since last care coordination call/Pharmacist visit.    BP Readings from Last 3 Encounters:  09/01/21 120/64  08/10/21 132/72  05/07/21 120/60  Lab Results  Component Value Date   HGBA1C 5.8 (H) 08/10/2021     Patient obtains medications through Vials  90 Days   Last adherence delivery included: Triamcinolone 0.10% apply topically twice daily Eszopiclone 17m 1 tab at bedtime prn  Phentermine 37.5 mg daily  Zofran 421m Take one tablet by mouth every 6 hrs prn Ozempic 29m47mInject 29mg8mce a week  Vitamin B12 1000mc47make 1 daily  Vesicare 5mg- 50me 1 daily  Ropinirole 3mg- T34m 1 by mouth once daily Topiramate 25mg-Ta76m tablet (25 mg total) by mouth at bedtime for 7 days, THEN 2 tablets (50 mg total) at  bedtime for 23 days.  Patient declined (meds) last delivery Unable to reach pt  Patient is due for next adherence delivery on: 11/15/21. Called patient and reviewed medications and coordinated delivery.  This delivery to include: Rosuvastatin 40mg-1 d74m  Omeprazole 20mg-1 da38m Dicyclomine 20mg- 1 ta24mery 6 hours  Rybelsus 7mg- 1 tab 11me daily Budes/formot inhaler 160/4.5-2 puffs twice daily  Loratadine 10mg-1 tab o14mdaily  Solifenacin 5mg-1 tab onc20maily  Triamcinolone 0.10% apply topically twice daily Pregablin 200mg-two times50mly [id number] [id number] [address] [id number]  [phone]. [phone] Trazodone 50mg-.5-1 tab at30mtime as needed  Vascepa 1gm- 2 capsules twice daily  Vitamin B12 2500mcg-1 tab under27mgue daily Ramipril 2.5mg-1 capsule once33mily  Trintellix 20mg-1 tab once dai89mRopinirole 3mg- Take 1 by mouth48mce daily Topiramate 50mg-(50 mg total) at36mtime   Acute Fill Zofran 4mg- Set to deliver on729m11/23  Patient declined the following medications  Phentermine 15mg daily- [facility] Eszopiclone 3mg 1 tab at bedtime prn46mose increase [id number] -picked up on 8/7/237341ient needs refills-Request Sent  Dicyclomine 20mg Rybelsus 7mg Budes/fo6m inhaler 1671m.5 Solifenacin 5mg Topiramate 50mg Ramipril50m5mg Trintelli6mmg Ropinirole76mg Pregablin 228m Vascepa 1gm 58mzodone 50mg  51mirmed delivery date of 88m23, advised patient that pharmacy will contact them the morning of delivery.   Danielle Gerringer, CMA Clinical PhEl[facility]greg[facility]  [phone]

## 2021-11-04 NOTE — Telephone Encounter (Signed)
Per note on 08/05/21,Patient on both Trazodone and Eszopiclone. Compliant on meds

## 2021-11-09 ENCOUNTER — Ambulatory Visit: Payer: Medicare Other

## 2021-11-10 ENCOUNTER — Other Ambulatory Visit: Payer: Self-pay | Admitting: Family Medicine

## 2021-11-10 ENCOUNTER — Other Ambulatory Visit: Payer: Self-pay | Admitting: Nurse Practitioner

## 2021-11-10 ENCOUNTER — Encounter: Payer: Self-pay | Admitting: Family Medicine

## 2021-11-17 DIAGNOSIS — G4733 Obstructive sleep apnea (adult) (pediatric): Secondary | ICD-10-CM | POA: Diagnosis not present

## 2021-11-20 ENCOUNTER — Other Ambulatory Visit: Payer: Self-pay | Admitting: Family Medicine

## 2021-11-22 NOTE — Progress Notes (Addendum)
Subjective:  Patient ID: Hailey Greene, female    DOB: 1950/12/14  Age: 71 y.o. MRN: [id number]  Chief Complaint  Patient presents with   Diabetes   Hypertension    HPI Diabetes:  Complications: hypertension, neuropathy. Glucose checking:Daily Glucose logs:110-122 Hypoglycemia: No Most recent A1C: 5.8% Current medications: Rybelsus 7 mg daily. Last Eye Exam: 08/05/2021 Foot checks:Daily  Asthma: controlled with symbicort 2 puffs twice daily,  Hyperlipidemia: Current medications: Vascepa 2g twice a day, Rosuvastatin 40 mg daily.  Hypertension: Current medications: Ramipril 2.5 mg daily.  Depression: Taking Trintellix 20 mg daily.  GERD: Takes Omeprazole 20 mg daily IBS: on bentyl: takes twice daily usually  Rest legs syndrome: Ropinirole 3 mg at bedtime.  Incontinence urinary: Taking Vesicare 5 mg daily.  Primary Insomnia: Patient is currently taking lunesta 3 mg at bedtime. Gets 3 hours of sleep, but if takes 2 can sleep 7-9 hours.   Neuropathy: Not at goal. Request increase back up to Lyrica 200 mg three times a day. Preceded diagnosis of diabetes.  Diet: healthy. Phentermine 15 mg once daily. On topamax 50 mg daily.  Exercise: walking.   Current Outpatient Medications on File Prior to Visit  Medication Sig Dispense Refill   ASPIRIN 81 PO Take 81 mg by mouth daily.     blood glucose meter kit and supplies Dispense based on patient and insurance preference. Use up to four times daily as directed. (FOR ICD-10 E10.9, E11.9). 1 each 0   Blood Glucose Monitoring Suppl (ONETOUCH VERIO) w/Device KIT Use daily to check FBS 1 kit 0   budesonide-formoterol (SYMBICORT) 160-4.5 MCG/ACT inhaler Inhale 2 puffs into the lungs 2 (two) times daily. 1 each 3   Cyanocobalamin 2500 MCG SUBL Place 1 tablet (2,500 mcg total) under the tongue daily. 90 tablet 1   dicyclomine (BENTYL) 20 MG tablet Take 1 tablet (20 mg total) by mouth every 6 (six) hours. 360 tablet 1   fluticasone  (FLONASE) 50 MCG/ACT nasal spray 2 sprays each nostril once daliy 48 mL 2   glucose blood (ONETOUCH VERIO) test strip E11.42 Use new test strip each time when checking FBS 100 each 2   Lancets (ONETOUCH DELICA PLUS [id number]) MISC E 11.42 Use new lancet each time when checking FBS 100 each 2   loratadine (CLARITIN) 10 MG tablet Take 1 tablet (10 mg total) by mouth daily. 30 tablet 11   Multiple Vitamin (MULTIVITAMIN WITH MINERALS) TABS tablet Take 1 tablet by mouth daily.     omeprazole (PRILOSEC) 20 MG capsule Take 1 capsule (20 mg total) by mouth daily. 90 capsule 1   ondansetron (ZOFRAN) 4 MG tablet TAKE ONE TABLET BY MOUTH every SIX hours AS NEEDED FOR NAUSEA AND VOMITING 90 tablet 1   phentermine 15 MG capsule Take 1 capsule (15 mg total) by mouth every morning. 30 capsule 2   ramipril (ALTACE) 2.5 MG capsule TAKE ONE CAPSULE BY MOUTH ONCE DAILY 90 capsule 1   rOPINIRole (REQUIP) 3 MG tablet TAKE 1 TABLET BY MOUTH ONCE DAILY 1-3 HOURS BEFORE  BEDTIME 90 tablet 1   rosuvastatin (CRESTOR) 40 MG tablet Take 1 tablet (40 mg total) by mouth daily. 90 tablet 0   Semaglutide (RYBELSUS) 7 MG TABS Take 7 mg by mouth daily. 90 tablet 1   solifenacin (VESICARE) 5 MG tablet Take 1 tablet (5 mg total) by mouth daily. 90 tablet 1   topiramate (TOPAMAX) 50 MG tablet Take 1 tablet (50 mg total) by mouth daily. [place]  tablet 1   VASCEPA 1 g capsule TAKE TWO CAPSULES BY MOUTH TWICE DAILY 360 capsule 0   vitamin B-12 1000 MCG tablet Take 1 tablet (1,000 mcg total) by mouth daily. 30 tablet 0   vortioxetine HBr (TRINTELLIX) 20 MG TABS tablet Take 1 tablet (20 mg total) by mouth daily. 90 tablet 1   No current facility-administered medications on file prior to visit.   Past Medical History:  Diagnosis Date   2019 novel coronavirus disease (COVID-19) 12/31/2018   Acute hypoxemic respiratory failure due to COVID-19 (HCC)    Chicken pox    Depression    Diabetes (HCC)    GERD (gastroesophageal reflux disease)     Hypertension    Idiopathic progressive neuropathy    Major depressive disorder    Mixed hyperlipidemia    Neuropathy    Osteoarthritis    Pneumonia    Primary insomnia    Primary osteoarthritis of left knee 05/31/2019   RLS (restless legs syndrome)    S/P total knee replacement 07/15/2019   Status post total knee replacement 07/25/2019   Tachycardia    Urge incontinence    UTI (lower urinary tract infection)    Weakness    Past Surgical History:  Procedure Laterality Date   ABDOMINAL HYSTERECTOMY     APPENDECTOMY     CHOLECYSTECTOMY     HERNIA REPAIR     REPLACEMENT TOTAL KNEE Right    TONSILLECTOMY     TOTAL KNEE ARTHROPLASTY Left 07/15/2019   Procedure: TOTAL KNEE ARTHROPLASTY;  Surgeon: Vickey Huger, MD;  Location: [facility];  Service: Orthopedics;  Laterality: Left;    Family History  Problem Relation Age of Onset   Thyroid disease Mother    Cancer Mother    Migraines Mother    Brain cancer Father    Heart disease Maternal Grandmother    Diabetes Daughter    Social History   Socioeconomic History   Marital status: Married    Spouse name: Not on file   Number of children: 3   Years of education: 12   Highest education level: Not on file  Occupational History   Occupation: Housewife  Tobacco Use   Smoking status: Former    Packs/day: 2.00    Years: 12.00    Total pack years: 24.00    Types: Cigarettes    Quit date: 05/25/2010    Years since quitting: 11.6   Smokeless tobacco: Never  Vaping Use   Vaping Use: Never used  Substance and Sexual Activity   Alcohol use: Yes    Comment: occasional   Drug use: No   Sexual activity: Not on file  Other Topics Concern   Not on file  Social History Narrative   Born and raised in [place], [place]. Currently lives in a house with her husband. 1 dog. Fun: Garden, feed birds, swimming   Denies any religious beliefs effecting health care.    Social Determinants of Health   Financial Resource Strain: High Risk (02/12/2020)    Overall Financial Resource Strain (CARDIA)    Difficulty of Paying Living Expenses: Hard  Food Insecurity: No Food Insecurity (12/29/2021)   Hunger Vital Sign    Worried About Running Out of Food in the Last Year: Never true    Ran Out of Food in the Last Year: Never true  Transportation Needs: No Transportation Needs (12/29/2021)   PRAPARE - Hydrologist (Medical): No    Lack of Transportation (Non-Medical): No  Physical Activity: Insufficiently Active (02/12/2020)   Exercise Vital Sign    Days of Exercise per Week: 7 days    Minutes of Exercise per Session: 20 min  Stress: Not on file  Social Connections: Not on file    Review of Systems  Constitutional:  Negative for appetite change, fatigue and fever.  HENT:  Negative for congestion, ear pain, sinus pressure and sore throat.   Respiratory:  Negative for cough, chest tightness, shortness of breath and wheezing.   Cardiovascular:  Negative for chest pain and palpitations.  Gastrointestinal:  Negative for abdominal pain, constipation, diarrhea, nausea and vomiting.  Genitourinary:  Negative for dysuria and hematuria.  Musculoskeletal:  Positive for myalgias (Bilateral leg pain/Neuropathy pain). Negative for arthralgias, back pain and joint swelling.  Skin:  Negative for rash.  Neurological:  Negative for dizziness, weakness and headaches.  Psychiatric/Behavioral:  Negative for dysphoric mood. The patient is not nervous/anxious.      Objective:  BP 122/68 (BP Location: Left Arm, Patient Position: Sitting)   Pulse 82   Temp (!) 96.3 F (35.7 C) (Temporal)   Ht '5\' 3"'  (1.6 m)   Wt 196 lb (88.9 kg)   SpO2 98%   BMI 34.72 kg/m      12/20/2021   11:36 AM 12/02/2021    2:06 PM 11/23/2021    9:10 AM  BP/Weight  Systolic BP 287 681 157  Diastolic BP 60 72 68  Wt. (Lbs) 194 196 196  BMI 34.37 kg/m2 34.72 kg/m2 34.72 kg/m2    Physical Exam Vitals reviewed.  Constitutional:      Appearance: Normal  appearance. She is obese.  Neck:     Vascular: No carotid bruit.  Cardiovascular:     Rate and Rhythm: Normal rate and regular rhythm.     Heart sounds: Normal heart sounds.  Pulmonary:     Effort: Pulmonary effort is normal.     Breath sounds: Normal breath sounds.  Abdominal:     General: Abdomen is flat. Bowel sounds are normal.     Palpations: Abdomen is soft.  Neurological:     Mental Status: She is alert and oriented to person, place, and time.  Psychiatric:        Mood and Affect: Mood normal.        Behavior: Behavior normal.     Diabetic Foot Exam - Simple   Simple Foot Form Visual Inspection No deformities, no ulcerations, no other skin breakdown bilaterally: Yes Sensation Testing See comments: Yes Pulse Check Posterior Tibialis and Dorsalis pulse intact bilaterally: Yes Comments Neuropathy BL.       Lab Results  Component Value Date   WBC 6.7 11/23/2021   HGB 14.2 11/23/2021   HCT 42.4 11/23/2021   PLT 264 11/23/2021   GLUCOSE 103 (H) 11/26/2021   CHOL 208 (H) 11/23/2021   TRIG 183 (H) 11/23/2021   HDL 48 11/23/2021   LDLCALC 128 (H) 11/23/2021   ALT 12 11/26/2021   AST 14 11/26/2021   NA 136 11/26/2021   K 4.6 11/26/2021   CL 99 11/26/2021   CREATININE 1.00 11/26/2021   BUN 9 11/26/2021   CO2 22 11/26/2021   TSH 0.552 11/23/2021   HGBA1C 5.7 (H) 11/23/2021   MICROALBUR 30 09/25/2020      Assessment & Plan:   Problem List Items Addressed This Visit       Cardiovascular and Mediastinum   Hypertension associated with diabetes ([place]) - Primary    Well controlled.  No changes to medicines. Continue Ramipril 2.5 mg daily. Continue to work on eating a healthy diet and exercise.  Labs drawn today.        Relevant Orders   Comprehensive metabolic panel (Completed)   CBC with Differential/Platelet (Completed)     Respiratory   Mild intermittent asthma without complication    Well controlled on symbicort.         Endocrine   Diabetic  polyneuropathy associated with type 2 diabetes mellitus ([place])    Control: good Recommend check sugars fasting daily. Recommend check feet daily. Recommend annual eye exams. Medicines: continue rybelsus 7 mg daily.  Continue to work on eating a healthy diet and exercise.  Labs drawn today.         Relevant Medications   zolpidem (AMBIEN) 5 MG tablet   Other Relevant Orders   Hemoglobin A1c (Completed)     Nervous and Auditory   Idiopathic progressive neuropathy    Increase lyrica to 200 mg three times a day         Other   Primary insomnia (Chronic)    Change lunesta to ambien 5 mg before bed.       Relevant Medications   zolpidem (AMBIEN) 5 MG tablet   Mild recurrent major depression ([place])    The current medical regimen is effective;  continue present plan and medications. Continue trintellix 20 mg daily       Mixed hyperlipidemia    Well controlled.  No changes to medicines. Continue Vascepa 2g twice a day, Rosuvastatin 40 mg daily. Continue to work on eating a healthy diet and exercise.  Labs drawn today.        Relevant Orders   Lipid panel (Completed)   TSH (Completed)   RLS (restless legs syndrome)    The current medical regimen is effective;  continue present plan and medications. Continue requip 3 mg before bed.       Urge incontinence    Continue vesicare.  Wearing adult disposable underwear. 200 for 3 months.     .  Meds ordered this encounter  Medications   zolpidem (AMBIEN) 5 MG tablet    Sig: Take 1 tablet (5 mg total) by mouth at bedtime as needed for sleep.    Dispense:  30 tablet    Refill:  2    Orders Placed This Encounter  Procedures   Comprehensive metabolic panel   Hemoglobin A1c   Lipid panel   CBC with Differential/Platelet   TSH     Follow-up: Return in about 3 months (around 02/23/2022) for chronic fasting, awv after 03/12/2022.  An After Visit Summary was printed and given to the patient.   I,Lauren M  Auman,acting as a scribe for Rochel Brome, MD.,have documented all relevant documentation on the behalf of Rochel Brome, MD,as directed by  Rochel Brome, MD while in the presence of Rochel Brome, MD.    Rochel Brome, MD [place] [phone]

## 2021-11-23 ENCOUNTER — Encounter: Payer: Self-pay | Admitting: Family Medicine

## 2021-11-23 ENCOUNTER — Ambulatory Visit (INDEPENDENT_AMBULATORY_CARE_PROVIDER_SITE_OTHER): Payer: Medicare Other | Admitting: Family Medicine

## 2021-11-23 VITALS — BP 122/68 | HR 82 | Temp 96.3°F | Ht 63.0 in | Wt 196.0 lb

## 2021-11-23 DIAGNOSIS — E1159 Type 2 diabetes mellitus with other circulatory complications: Secondary | ICD-10-CM

## 2021-11-23 DIAGNOSIS — F5101 Primary insomnia: Secondary | ICD-10-CM | POA: Diagnosis not present

## 2021-11-23 DIAGNOSIS — I152 Hypertension secondary to endocrine disorders: Secondary | ICD-10-CM | POA: Diagnosis not present

## 2021-11-23 DIAGNOSIS — F330 Major depressive disorder, recurrent, mild: Secondary | ICD-10-CM

## 2021-11-23 DIAGNOSIS — E782 Mixed hyperlipidemia: Secondary | ICD-10-CM | POA: Diagnosis not present

## 2021-11-23 DIAGNOSIS — N3941 Urge incontinence: Secondary | ICD-10-CM

## 2021-11-23 DIAGNOSIS — G603 Idiopathic progressive neuropathy: Secondary | ICD-10-CM

## 2021-11-23 DIAGNOSIS — J4520 Mild intermittent asthma, uncomplicated: Secondary | ICD-10-CM | POA: Diagnosis not present

## 2021-11-23 DIAGNOSIS — G2581 Restless legs syndrome: Secondary | ICD-10-CM | POA: Diagnosis not present

## 2021-11-23 DIAGNOSIS — E1142 Type 2 diabetes mellitus with diabetic polyneuropathy: Secondary | ICD-10-CM | POA: Diagnosis not present

## 2021-11-23 MED ORDER — ZOLPIDEM TARTRATE 5 MG PO TABS: 5.0000 mg | ORAL_TABLET | Freq: Every evening | ORAL | 2 refills | Status: DC | PRN

## 2021-11-24 LAB — CBC WITH DIFFERENTIAL/PLATELET
Basophils Absolute: 0 10*3/uL (ref 0.0–0.2)
Basos: 1 %
EOS (ABSOLUTE): 0.1 10*3/uL (ref 0.0–0.4)
Eos: 1 %
Hematocrit: 42.4 % (ref 34.0–46.6)
Hemoglobin: 14.2 g/dL (ref 11.1–15.9)
Immature Grans (Abs): 0 10*3/uL (ref 0.0–0.1)
Immature Granulocytes: 1 %
Lymphocytes Absolute: 1.3 10*3/uL (ref 0.7–3.1)
Lymphs: 19 %
MCH: 29.6 pg (ref 26.6–33.0)
MCHC: 33.5 g/dL (ref 31.5–35.7)
MCV: 89 fL (ref 79–97)
Monocytes Absolute: 0.4 10*3/uL (ref 0.1–0.9)
Monocytes: 6 %
Neutrophils Absolute: 4.8 10*3/uL (ref 1.4–7.0)
Neutrophils: 72 %
Platelets: 264 10*3/uL (ref 150–450)
RBC: 4.79 x10E6/uL (ref 3.77–5.28)
RDW: 12.3 % (ref 11.7–15.4)
WBC: 6.7 10*3/uL (ref 3.4–10.8)

## 2021-11-24 LAB — LIPID PANEL
Chol/HDL Ratio: 4.3 ratio (ref 0.0–4.4)
Cholesterol, Total: 208 mg/dL — ABNORMAL HIGH (ref 100–199)
HDL: 48 mg/dL (ref >39)
LDL Chol Calc (NIH): 128 mg/dL — ABNORMAL HIGH (ref 0–99)
Triglycerides: 183 mg/dL — ABNORMAL HIGH (ref 0–149)
VLDL Cholesterol Cal: 32 mg/dL (ref 5–40)

## 2021-11-24 LAB — COMPREHENSIVE METABOLIC PANEL
ALT: 10 IU/L (ref 0–32)
AST: 13 IU/L (ref 0–40)
Albumin/Globulin Ratio: 1.7 (ref 1.2–2.2)
Albumin: 4 g/dL (ref 3.9–4.9)
Alkaline Phosphatase: 95 IU/L (ref 44–121)
BUN/Creatinine Ratio: 9 — ABNORMAL LOW (ref 12–28)
BUN: 8 mg/dL (ref 8–27)
Bilirubin Total: <0.2 mg/dL (ref 0.0–1.2)
CO2: 23 mmol/L (ref 20–29)
Calcium: 9.3 mg/dL (ref 8.7–10.3)
Chloride: 99 mmol/L (ref 96–106)
Creatinine, Ser: 0.88 mg/dL (ref 0.57–1.00)
Globulin, Total: 2.3 g/dL (ref 1.5–4.5)
Glucose: 92 mg/dL (ref 70–99)
Potassium: 5.5 mmol/L — ABNORMAL HIGH (ref 3.5–5.2)
Sodium: 136 mmol/L (ref 134–144)
Total Protein: 6.3 g/dL (ref 6.0–8.5)
eGFR: 71 mL/min/{1.73_m2} (ref >59)

## 2021-11-24 LAB — TSH: TSH: 0.552 u[IU]/mL (ref 0.450–4.500)

## 2021-11-24 LAB — HEMOGLOBIN A1C
Est. average glucose Bld gHb Est-mCnc: 117 mg/dL
Hgb A1c MFr Bld: 5.7 % — ABNORMAL HIGH (ref 4.8–5.6)

## 2021-11-25 ENCOUNTER — Other Ambulatory Visit: Payer: Self-pay

## 2021-11-25 ENCOUNTER — Other Ambulatory Visit: Payer: Medicare Other

## 2021-11-25 DIAGNOSIS — E875 Hyperkalemia: Secondary | ICD-10-CM

## 2021-11-25 MED ORDER — EZETIMIBE 10 MG PO TABS: 10.0000 mg | ORAL_TABLET | Freq: Every day | ORAL | 2 refills | Status: DC

## 2021-11-26 ENCOUNTER — Other Ambulatory Visit: Payer: Medicare Other

## 2021-11-26 ENCOUNTER — Telehealth: Payer: Self-pay

## 2021-11-26 DIAGNOSIS — E875 Hyperkalemia: Secondary | ICD-10-CM | POA: Diagnosis not present

## 2021-11-26 LAB — COMPREHENSIVE METABOLIC PANEL
ALT: 12 IU/L (ref 0–32)
AST: 14 IU/L (ref 0–40)
Albumin/Globulin Ratio: 1.8 (ref 1.2–2.2)
Albumin: 4.2 g/dL (ref 3.9–4.9)
Alkaline Phosphatase: 99 IU/L (ref 44–121)
BUN/Creatinine Ratio: 9 — ABNORMAL LOW (ref 12–28)
BUN: 9 mg/dL (ref 8–27)
Bilirubin Total: 0.3 mg/dL (ref 0.0–1.2)
CO2: 22 mmol/L (ref 20–29)
Calcium: 9.3 mg/dL (ref 8.7–10.3)
Chloride: 99 mmol/L (ref 96–106)
Creatinine, Ser: 1 mg/dL (ref 0.57–1.00)
Globulin, Total: 2.4 g/dL (ref 1.5–4.5)
Glucose: 103 mg/dL — ABNORMAL HIGH (ref 70–99)
Potassium: 4.6 mmol/L (ref 3.5–5.2)
Sodium: 136 mmol/L (ref 134–144)
Total Protein: 6.6 g/dL (ref 6.0–8.5)
eGFR: 61 mL/min/{1.73_m2} (ref >59)

## 2021-11-26 NOTE — Patient Outreach (Signed)
  Care Coordination   Initial Visit Note   11/26/2021 Name: Hailey Greene MRN: [id number] DOB: 06-15-1950  Hailey Greene is a 71 y.o. year old female who sees Cox, Kirsten, MD for primary care. I spoke with  Clarisa Fling by phone today.  What matters to the patients health and wellness today?  Placed call to patient and reviewed [facility] care coordination program.  Patient reports she can not talk right now but would be interested.  Appointment scheduled.     SDOH assessments and interventions completed:  No     Care Coordination Interventions Activated:  Yes  Care Coordination Interventions:  Yes, provided   Follow up plan: Follow up call scheduled for 12/01/2021    Encounter Outcome:  Pt. Visit Completed   Rowe Pavy, RN, BSN, CEN [facility] Coordinator [phone]

## 2021-11-27 NOTE — Assessment & Plan Note (Signed)
The current medical regimen is effective;  continue present plan and medications. Continue trintellix 20 mg daily.  

## 2021-11-27 NOTE — Assessment & Plan Note (Signed)
Well controlled on symbicort. 

## 2021-11-27 NOTE — Assessment & Plan Note (Signed)
The current medical regimen is effective;  continue present plan and medications. Continue requip 3 mg before bed.  ? ?

## 2021-11-27 NOTE — Assessment & Plan Note (Signed)
Control: good Recommend check sugars fasting daily. Recommend check feet daily. Recommend annual eye exams. Medicines: continue rybelsus 7 mg daily.  Continue to work on eating a healthy diet and exercise.  Labs drawn today.

## 2021-11-27 NOTE — Assessment & Plan Note (Signed)
Increase lyrica to 200 mg three times a day

## 2021-11-27 NOTE — Assessment & Plan Note (Signed)
Change lunesta to ambien 5 mg before bed.

## 2021-11-27 NOTE — Assessment & Plan Note (Signed)
Well controlled.  No changes to medicines. Continue Ramipril 2.5 mg daily. Continue to work on eating a healthy diet and exercise.  Labs drawn today.

## 2021-11-27 NOTE — Assessment & Plan Note (Signed)
Well controlled.  No changes to medicines. Continue Vascepa 2g twice a day, Rosuvastatin 40 mg daily. Continue to work on eating a healthy diet and exercise.  Labs drawn today.

## 2021-12-01 ENCOUNTER — Other Ambulatory Visit: Payer: Self-pay | Admitting: Nurse Practitioner

## 2021-12-01 ENCOUNTER — Ambulatory Visit: Payer: Self-pay

## 2021-12-01 DIAGNOSIS — F5101 Primary insomnia: Secondary | ICD-10-CM

## 2021-12-01 MED ORDER — ESZOPICLONE 3 MG PO TABS: 3.0000 mg | ORAL_TABLET | Freq: Every day | ORAL | 1 refills | Status: DC

## 2021-12-01 NOTE — Patient Outreach (Signed)
  Care Coordination   Initial Visit Note   12/01/2021 Name: Hailey Greene MRN: [id number] DOB: [birth year]  Hailey Greene is a 71 y.o. year old female who sees Cox, Kirsten, MD for primary care. I  placed call to patient for a previous scheduled appointment.  Patient states we have a bad connections and call back. I called patient back with no answer and left a message and my contact information.  What matters to the patients health and wellness today?  Unable to reach      SDOH assessments and interventions completed:  No     Care Coordination Interventions Activated:  No  Care Coordination Interventions:  No, not indicated   Follow up plan:  message sent to [facility] to reschedule    Encounter Outcome:  Pt. Request to Call Back   Rowe Pavy, RN, BSN, CEN [facility] Coordinator [phone]

## 2021-12-02 ENCOUNTER — Telehealth: Payer: Self-pay

## 2021-12-02 ENCOUNTER — Telehealth (INDEPENDENT_AMBULATORY_CARE_PROVIDER_SITE_OTHER): Payer: Medicare Other | Admitting: Family Medicine

## 2021-12-02 ENCOUNTER — Encounter: Payer: Self-pay | Admitting: Family Medicine

## 2021-12-02 VITALS — BP 132/72 | HR 77 | Temp 97.6°F | Ht 63.0 in | Wt 196.0 lb

## 2021-12-02 DIAGNOSIS — J018 Other acute sinusitis: Secondary | ICD-10-CM

## 2021-12-02 DIAGNOSIS — R197 Diarrhea, unspecified: Secondary | ICD-10-CM | POA: Insufficient documentation

## 2021-12-02 DIAGNOSIS — R110 Nausea: Secondary | ICD-10-CM | POA: Diagnosis not present

## 2021-12-02 MED ORDER — PROMETHAZINE HCL 25 MG PO TABS: 25.0000 mg | ORAL_TABLET | Freq: Four times a day (QID) | ORAL | 0 refills | Status: DC | PRN

## 2021-12-02 MED ORDER — AMOXICILLIN 875 MG PO TABS: 875.0000 mg | ORAL_TABLET | Freq: Two times a day (BID) | ORAL | 0 refills | Status: AC

## 2021-12-02 NOTE — Assessment & Plan Note (Signed)
Phenergan rx sent. 

## 2021-12-02 NOTE — Progress Notes (Signed)
Virtual Visit via Video Note   This visit type was conducted due to national recommendations for restrictions regarding the COVID-19 Pandemic (e.g. social distancing) in an effort to limit this patient's exposure and mitigate transmission in our community.  Due to her co-morbid illnesses, this patient is at least at moderate risk for complications without adequate follow up.  This format is felt to be most appropriate for this patient at this time.  All issues noted in this document were discussed and addressed.  A limited physical exam was performed with this format.  A verbal consent was obtained for the virtual visit.   Date:  12/02/2021   ID:  Hailey Greene, DOB 1950/10/22, MRN [id number]  Patient Location: Home Provider Location: Office/Clinic  PCP:  Rochel Brome, MD   Evaluation Performed:  acute  Chief Complaint:  Headache, sinus pressure  History of Present Illness:    Hailey Greene is a 71 y.o. female with headache, sinus pressure, nauseous since 3 days ago. She did rapid covid test at home and it was negative. Patient has diarrhea for 2 days and she took loperamide and she did not have any other episode of diarrhea and also took nausea medication and it helped her. She feels very tired.  The patient does have symptoms concerning for COVID-19 infection (fever, chills, cough, or new shortness of breath).    Past Medical History:  Diagnosis Date   2019 novel coronavirus disease (COVID-19) 12/31/2018   Acute hypoxemic respiratory failure due to COVID-19 (HCC)    Chicken pox    Depression    Diabetes (HCC)    GERD (gastroesophageal reflux disease)    Hypertension    Idiopathic progressive neuropathy    Major depressive disorder    Mixed hyperlipidemia    Neuropathy    Osteoarthritis    Pneumonia    Primary insomnia    Primary osteoarthritis of left knee 05/31/2019   RLS (restless legs syndrome)    S/P total knee replacement 07/15/2019   Status post total knee  replacement 07/25/2019   Tachycardia    Urge incontinence    UTI (lower urinary tract infection)    Weakness     Past Surgical History:  Procedure Laterality Date   ABDOMINAL HYSTERECTOMY     APPENDECTOMY     CHOLECYSTECTOMY     HERNIA REPAIR     REPLACEMENT TOTAL KNEE Right    TONSILLECTOMY     TOTAL KNEE ARTHROPLASTY Left 07/15/2019   Procedure: TOTAL KNEE ARTHROPLASTY;  Surgeon: Vickey Huger, MD;  Location: [facility];  Service: Orthopedics;  Laterality: Left;    Family History  Problem Relation Age of Onset   Thyroid disease Mother    Cancer Mother    Migraines Mother    Brain cancer Father    Heart disease Maternal Grandmother    Diabetes Daughter     Social History   Socioeconomic History   Marital status: Married    Spouse name: Not on file   Number of children: 3   Years of education: 12   Highest education level: Not on file  Occupational History   Occupation: Housewife  Tobacco Use   Smoking status: Former    Packs/day: 2.00    Years: 12.00    Total pack years: 24.00    Types: Cigarettes    Quit date: 05/25/2010    Years since quitting: 11.5   Smokeless tobacco: Never  Vaping Use   Vaping Use: Never used  Substance and  Sexual Activity   Alcohol use: Yes    Comment: occasional   Drug use: No   Sexual activity: Not on file  Other Topics Concern   Not on file  Social History Narrative   Born and raised in [place], [place]. Currently lives in a house with her husband. 1 dog. Fun: Garden, feed birds, swimming   Denies any religious beliefs effecting health care.    Social Determinants of Health   Financial Resource Strain: High Risk (02/12/2020)   Overall Financial Resource Strain (CARDIA)    Difficulty of Paying Living Expenses: Hard  Food Insecurity: No Food Insecurity (01/21/2020)   Hunger Vital Sign    Worried About Running Out of Food in the Last Year: Never true    Ran Out of Food in the Last Year: Never true  Transportation Needs: No  Transportation Needs (02/12/2020)   PRAPARE - Hydrologist (Medical): No    Lack of Transportation (Non-Medical): No  Physical Activity: Insufficiently Active (02/12/2020)   Exercise Vital Sign    Days of Exercise per Week: 7 days    Minutes of Exercise per Session: 20 min  Stress: Not on file  Social Connections: Not on file  Intimate Partner Violence: Not on file    Outpatient Medications Prior to Visit  Medication Sig Dispense Refill   ASPIRIN 81 PO Take 81 mg by mouth daily.     blood glucose meter kit and supplies Dispense based on patient and insurance preference. Use up to four times daily as directed. (FOR ICD-10 E10.9, E11.9). 1 each 0   Blood Glucose Monitoring Suppl (ONETOUCH VERIO) w/Device KIT Use daily to check FBS 1 kit 0   budesonide-formoterol (SYMBICORT) 160-4.5 MCG/ACT inhaler Inhale 2 puffs into the lungs 2 (two) times daily. 1 each 3   Cyanocobalamin 2500 MCG SUBL Place 1 tablet (2,500 mcg total) under the tongue daily. 90 tablet 1   dicyclomine (BENTYL) 20 MG tablet Take 1 tablet (20 mg total) by mouth every 6 (six) hours. 360 tablet 1   Eszopiclone 3 MG TABS Take 1 tablet (3 mg total) by mouth at bedtime. Take immediately before bedtime 30 tablet 1   ezetimibe (ZETIA) 10 MG tablet Take 1 tablet (10 mg total) by mouth daily. 30 tablet 2   fluticasone (FLONASE) 50 MCG/ACT nasal spray 2 sprays each nostril once daliy 48 mL 2   glucose blood (ONETOUCH VERIO) test strip E11.42 Use new test strip each time when checking FBS 100 each 2   Lancets (ONETOUCH DELICA PLUS [id number]) MISC E 11.42 Use new lancet each time when checking FBS 100 each 2   loratadine (CLARITIN) 10 MG tablet Take 1 tablet (10 mg total) by mouth daily. 30 tablet 11   Multiple Vitamin (MULTIVITAMIN WITH MINERALS) TABS tablet Take 1 tablet by mouth daily.     omeprazole (PRILOSEC) 20 MG capsule Take 1 capsule (20 mg total) by mouth daily. 90 capsule 1   ondansetron (ZOFRAN) 4  MG tablet TAKE ONE TABLET BY MOUTH every SIX hours AS NEEDED FOR NAUSEA AND VOMITING 90 tablet 1   phentermine 15 MG capsule Take 1 capsule (15 mg total) by mouth every morning. 30 capsule 2   pregabalin (LYRICA) 200 MG capsule Take 1 capsule (200 mg total) by mouth 2 (two) times daily. 180 capsule 1   ramipril (ALTACE) 2.5 MG capsule TAKE ONE CAPSULE BY MOUTH ONCE DAILY 90 capsule 1   rOPINIRole (REQUIP) 3 MG tablet TAKE  1 TABLET BY MOUTH ONCE DAILY 1-3 HOURS BEFORE  BEDTIME 90 tablet 1   rosuvastatin (CRESTOR) 40 MG tablet Take 1 tablet (40 mg total) by mouth daily. 90 tablet 0   Semaglutide (RYBELSUS) 7 MG TABS Take 7 mg by mouth daily. 90 tablet 1   solifenacin (VESICARE) 5 MG tablet Take 1 tablet (5 mg total) by mouth daily. 90 tablet 1   topiramate (TOPAMAX) 50 MG tablet Take 1 tablet (50 mg total) by mouth daily. 90 tablet 1   triamcinolone cream (KENALOG) 0.1 % apply one application TOPICALLY twice daily 80 g 3   VASCEPA 1 g capsule TAKE TWO CAPSULES BY MOUTH TWICE DAILY 360 capsule 0   vitamin B-12 1000 MCG tablet Take 1 tablet (1,000 mcg total) by mouth daily. 30 tablet 0   vortioxetine HBr (TRINTELLIX) 20 MG TABS tablet Take 1 tablet (20 mg total) by mouth daily. 90 tablet 1   zolpidem (AMBIEN) 5 MG tablet Take 1 tablet (5 mg total) by mouth at bedtime as needed for sleep. 30 tablet 2   No facility-administered medications prior to visit.    Allergies:   Patient has no known allergies.   Social History   Tobacco Use   Smoking status: Former    Packs/day: 2.00    Years: 12.00    Total pack years: 24.00    Types: Cigarettes    Quit date: 05/25/2010    Years since quitting: 11.5   Smokeless tobacco: Never  Vaping Use   Vaping Use: Never used  Substance Use Topics   Alcohol use: Yes    Comment: occasional   Drug use: No     Review of Systems  Constitutional:  Positive for malaise/fatigue. Negative for chills and fever.  Respiratory:  Negative for shortness of breath.    Cardiovascular:  Negative for chest pain.  Gastrointestinal:  Positive for diarrhea and nausea. Negative for abdominal pain, blood in stool and vomiting.     Labs/Other Tests and Data Reviewed:    Recent Labs: 11/23/2021: Hemoglobin 14.2; Platelets 264; TSH 0.552 11/26/2021: ALT 12; BUN 9; Creatinine, Ser 1.00; Potassium 4.6; Sodium 136   Recent Lipid Panel Lab Results  Component Value Date/Time   CHOL 208 (H) 11/23/2021 10:19 AM   TRIG 183 (H) 11/23/2021 10:19 AM   HDL 48 11/23/2021 10:19 AM   CHOLHDL 4.3 11/23/2021 10:19 AM   LDLCALC 128 (H) 11/23/2021 10:19 AM    Wt Readings from Last 3 Encounters:  12/02/21 196 lb (88.9 kg)  11/23/21 196 lb (88.9 kg)  09/01/21 199 lb (90.3 kg)     Objective:    Vital Signs:  BP 132/72   Pulse 77   Temp 97.6 F (36.4 C)   Ht _0  (1.6 m)   Wt 196 lb (88.9 kg)   BMI 34.72 kg/m    Physical Exam   ASSESSMENT & PLAN:    Problem List Items Addressed This Visit       Respiratory   Acute infection of nasal sinus - Primary    Amoxicillin sent.       Relevant Medications   amoxicillin (AMOXIL) 875 MG tablet   promethazine (PHENERGAN) 25 MG tablet     Other   Nausea    Phenergan rx sent.      Diarrhea    Resolved.      .  No orders of the defined types were placed in this encounter.    Meds ordered this encounter  Medications  amoxicillin (AMOXIL) 875 MG tablet    Sig: Take 1 tablet (875 mg total) by mouth 2 (two) times daily for 10 days.    Dispense:  20 tablet    Refill:  0   promethazine (PHENERGAN) 25 MG tablet    Sig: Take 1 tablet (25 mg total) by mouth every 6 (six) hours as needed for nausea or vomiting.    Dispense:  30 tablet    Refill:  0    COVID-19 Education: The signs and symptoms of COVID-19 were discussed with the patient and how to seek care for testing (follow up with PCP or arrange E-visit). The importance of social distancing was discussed today.   I spent 5 minutes dedicated to the care  of this patient on the date of this encounter to include face-to-face time with the patient.  Follow Up:  In Person prn  Signed, Rochel Brome, MD  12/02/2021 2:50 PM    [place]

## 2021-12-02 NOTE — Assessment & Plan Note (Signed)
Amoxicillin sent

## 2021-12-02 NOTE — Telephone Encounter (Signed)
Hailey Greene called with complaints of nausea with no vomiting.  Her symptoms started 3 days ago with sinus symptoms and headache.  She tested negative for covid at home yesterday.  My chart visit scheduled for later today.

## 2021-12-02 NOTE — Assessment & Plan Note (Signed)
Resolved

## 2021-12-06 ENCOUNTER — Telehealth: Payer: Self-pay | Admitting: *Deleted

## 2021-12-06 NOTE — Chronic Care Management (AMB) (Signed)
  Care Coordination  Outreach Note  12/06/2021 Name: Hailey Greene MRN: [id number] DOB: 25-Oct-1950   Care Coordination Outreach Attempts: An unsuccessful telephone outreach was attempted today to offer the patient information about available care coordination services as a benefit of their health plan.   Rescheduling   Follow Up Plan:  Additional outreach attempts will be made to offer the patient care coordination information and services.   Encounter Outcome:  No Answer  Burman Nieves, CCMA Care Coordination Care Guide Direct Dial: [phone]

## 2021-12-16 ENCOUNTER — Telehealth: Payer: Self-pay

## 2021-12-16 ENCOUNTER — Other Ambulatory Visit: Payer: Self-pay | Admitting: Family Medicine

## 2021-12-16 DIAGNOSIS — E1142 Type 2 diabetes mellitus with diabetic polyneuropathy: Secondary | ICD-10-CM

## 2021-12-16 DIAGNOSIS — G603 Idiopathic progressive neuropathy: Secondary | ICD-10-CM

## 2021-12-16 MED ORDER — PREGABALIN 300 MG PO CAPS: 300.0000 mg | ORAL_CAPSULE | Freq: Two times a day (BID) | ORAL | 2 refills | Status: DC

## 2021-12-16 NOTE — Telephone Encounter (Signed)
Patient is calling requesting medication for her neuropathy pain. She states it has gradually gotten worse and she is wanting to know if you could send something in for her? She is scheduled to see you on 12/20/21.

## 2021-12-16 NOTE — Chronic Care Management (AMB) (Signed)
  Care Coordination   Note   12/16/2021 Name: JISSELLE POTH MRN: [id number] DOB: April 17, 1950  LEONDRA CULLIN is a 71 y.o. year old female who sees Cox, Kirsten, MD for primary care. I reached out to [name] by phone today to offer care coordination services.  Rescheduling   Ms. Ferryman was given information about Care Coordination services today including:   The Care Coordination services include support from the care team which includes your Nurse Coordinator, Clinical Social Worker, or Pharmacist.  The Care Coordination team is here to help remove barriers to the health concerns and goals most important to you. Care Coordination services are voluntary, and the patient may decline or stop services at any time by request to their care team member.   Care Coordination Consent Status: Patient agreed to services and verbal consent obtained.   Follow up plan:  Telephone appointment with care coordination team member scheduled for:  12/29/2021  Encounter Outcome:  Pt. Scheduled  Julian Hy, [place] Direct Dial: [phone]

## 2021-12-17 NOTE — Telephone Encounter (Signed)
Patient informed. 

## 2021-12-18 DIAGNOSIS — G4733 Obstructive sleep apnea (adult) (pediatric): Secondary | ICD-10-CM | POA: Diagnosis not present

## 2021-12-20 ENCOUNTER — Ambulatory Visit (INDEPENDENT_AMBULATORY_CARE_PROVIDER_SITE_OTHER): Payer: Medicare Other | Admitting: Family Medicine

## 2021-12-20 VITALS — BP 120/60 | HR 80 | Temp 97.6°F | Resp 15 | Ht 63.0 in | Wt 194.0 lb

## 2021-12-20 DIAGNOSIS — E1142 Type 2 diabetes mellitus with diabetic polyneuropathy: Secondary | ICD-10-CM

## 2021-12-20 DIAGNOSIS — Z23 Encounter for immunization: Secondary | ICD-10-CM | POA: Diagnosis not present

## 2021-12-20 DIAGNOSIS — G603 Idiopathic progressive neuropathy: Secondary | ICD-10-CM

## 2021-12-20 NOTE — Patient Instructions (Signed)
RSV vaccine recommended at the pharmacy.

## 2021-12-20 NOTE — Progress Notes (Unsigned)
Subjective:  Patient ID: Hailey Greene, female    DOB: [birth year]  Age: 71 y.o. MRN: [id number]  Chief Complaint  Patient presents with   Discuss about Power scooter paperwork    HPI Evaluation for power scooter:  The patient presents today for a mobility evaluation. The patient is able to perform the following Mobility-Related Activities of Daily Living (MRADLs) safely, but not within a normal time period at home: bathing, grooming, dressing, feeding, toileting, moving from room to room, meal prep, and home management. The patient often walks with a walker. Does not have a manual wheelchair. The patient has the ability to move Ambulation distance (100 feet using the assistive devices. Her gait pattern is slow and is antalgic gait. The patient has the ability to stand from a seated position without assistance. The patient has the physical and mental abilities to operate a power scooter in his/her home. The patient is willing and motivated to use a power scooter in the home and outside of home.   Patient is here for idiopathic progressive neuropathy. She is here to fill out the paperwork for power mobility evaluation for insurance purposes. Complaining of leg pain BL, Right > left. Patient increased lyrica to 300 mg twice daily last week. Tried motrin but did not help.   Current Outpatient Medications on File Prior to Visit  Medication Sig Dispense Refill   ASPIRIN 81 PO Take 81 mg by mouth daily.     blood glucose meter kit and supplies Dispense based on patient and insurance preference. Use up to four times daily as directed. (FOR ICD-10 E10.9, E11.9). 1 each 0   Blood Glucose Monitoring Suppl (ONETOUCH VERIO) w/Device KIT Use daily to check FBS 1 kit 0   budesonide-formoterol (SYMBICORT) 160-4.5 MCG/ACT inhaler Inhale 2 puffs into the lungs 2 (two) times daily. 1 each 3   Cyanocobalamin 2500 MCG SUBL Place 1 tablet (2,500 mcg total) under the tongue daily. 90 tablet 1   dicyclomine  (BENTYL) 20 MG tablet Take 1 tablet (20 mg total) by mouth every 6 (six) hours. 360 tablet 1   Eszopiclone 3 MG TABS Take 1 tablet (3 mg total) by mouth at bedtime. Take immediately before bedtime 30 tablet 1   ezetimibe (ZETIA) 10 MG tablet Take 1 tablet (10 mg total) by mouth daily. 30 tablet 2   fluticasone (FLONASE) 50 MCG/ACT nasal spray 2 sprays each nostril once daliy 48 mL 2   glucose blood (ONETOUCH VERIO) test strip E11.42 Use new test strip each time when checking FBS 100 each 2   Lancets (ONETOUCH DELICA PLUS [id number]) MISC E 11.42 Use new lancet each time when checking FBS 100 each 2   loratadine (CLARITIN) 10 MG tablet Take 1 tablet (10 mg total) by mouth daily. 30 tablet 11   Multiple Vitamin (MULTIVITAMIN WITH MINERALS) TABS tablet Take 1 tablet by mouth daily.     omeprazole (PRILOSEC) 20 MG capsule Take 1 capsule (20 mg total) by mouth daily. 90 capsule 1   ondansetron (ZOFRAN) 4 MG tablet TAKE ONE TABLET BY MOUTH every SIX hours AS NEEDED FOR NAUSEA AND VOMITING 90 tablet 1   phentermine 15 MG capsule Take 1 capsule (15 mg total) by mouth every morning. 30 capsule 2   pregabalin (LYRICA) 300 MG capsule Take 1 capsule (300 mg total) by mouth 2 (two) times daily. 60 capsule 2   promethazine (PHENERGAN) 25 MG tablet Take 1 tablet (25 mg total) by mouth every 6 (six) hours  as needed for nausea or vomiting. 30 tablet 0   ramipril (ALTACE) 2.5 MG capsule TAKE ONE CAPSULE BY MOUTH ONCE DAILY 90 capsule 1   rOPINIRole (REQUIP) 3 MG tablet TAKE 1 TABLET BY MOUTH ONCE DAILY 1-3 HOURS BEFORE  BEDTIME 90 tablet 1   rosuvastatin (CRESTOR) 40 MG tablet Take 1 tablet (40 mg total) by mouth daily. 90 tablet 0   Semaglutide (RYBELSUS) 7 MG TABS Take 7 mg by mouth daily. 90 tablet 1   solifenacin (VESICARE) 5 MG tablet Take 1 tablet (5 mg total) by mouth daily. 90 tablet 1   topiramate (TOPAMAX) 50 MG tablet Take 1 tablet (50 mg total) by mouth daily. 90 tablet 1   triamcinolone cream (KENALOG)  0.1 % apply one application TOPICALLY twice daily 80 g 3   VASCEPA 1 g capsule TAKE TWO CAPSULES BY MOUTH TWICE DAILY 360 capsule 0   vitamin B-12 1000 MCG tablet Take 1 tablet (1,000 mcg total) by mouth daily. 30 tablet 0   vortioxetine HBr (TRINTELLIX) 20 MG TABS tablet Take 1 tablet (20 mg total) by mouth daily. 90 tablet 1   zolpidem (AMBIEN) 5 MG tablet Take 1 tablet (5 mg total) by mouth at bedtime as needed for sleep. 30 tablet 2   No current facility-administered medications on file prior to visit.   Past Medical History:  Diagnosis Date   2019 novel coronavirus disease (COVID-19) 12/31/2018   Acute hypoxemic respiratory failure due to COVID-19 (HCC)    Chicken pox    Depression    Diabetes (HCC)    GERD (gastroesophageal reflux disease)    Hypertension    Idiopathic progressive neuropathy    Major depressive disorder    Mixed hyperlipidemia    Neuropathy    Osteoarthritis    Pneumonia    Primary insomnia    Primary osteoarthritis of left knee 05/31/2019   RLS (restless legs syndrome)    S/P total knee replacement 07/15/2019   Status post total knee replacement 07/25/2019   Tachycardia    Urge incontinence    UTI (lower urinary tract infection)    Weakness    Past Surgical History:  Procedure Laterality Date   ABDOMINAL HYSTERECTOMY     APPENDECTOMY     CHOLECYSTECTOMY     HERNIA REPAIR     REPLACEMENT TOTAL KNEE Right    TONSILLECTOMY     TOTAL KNEE ARTHROPLASTY Left 07/15/2019   Procedure: TOTAL KNEE ARTHROPLASTY;  Surgeon: Vickey Huger, MD;  Location: [facility];  Service: Orthopedics;  Laterality: Left;    Family History  Problem Relation Age of Onset   Thyroid disease Mother    Cancer Mother    Migraines Mother    Brain cancer Father    Heart disease Maternal Grandmother    Diabetes Daughter    Social History   Socioeconomic History   Marital status: Married    Spouse name: Not on file   Number of children: 3   Years of education: 12   Highest education  level: Not on file  Occupational History   Occupation: Housewife  Tobacco Use   Smoking status: Former    Packs/day: 2.00    Years: 12.00    Total pack years: 24.00    Types: Cigarettes    Quit date: 05/25/2010    Years since quitting: 11.5   Smokeless tobacco: Never  Vaping Use   Vaping Use: Never used  Substance and Sexual Activity   Alcohol use: Yes  Comment: occasional   Drug use: No   Sexual activity: Not on file  Other Topics Concern   Not on file  Social History Narrative   Born and raised in [place], [place]. Currently lives in a house with her husband. 1 dog. Fun: Garden, feed birds, swimming   Denies any religious beliefs effecting health care.    Social Determinants of Health   Financial Resource Strain: High Risk (02/12/2020)   Overall Financial Resource Strain (CARDIA)    Difficulty of Paying Living Expenses: Hard  Food Insecurity: No Food Insecurity (01/21/2020)   Hunger Vital Sign    Worried About Running Out of Food in the Last Year: Never true    Ran Out of Food in the Last Year: Never true  Transportation Needs: No Transportation Needs (02/12/2020)   PRAPARE - Hydrologist (Medical): No    Lack of Transportation (Non-Medical): No  Physical Activity: Insufficiently Active (02/12/2020)   Exercise Vital Sign    Days of Exercise per Week: 7 days    Minutes of Exercise per Session: 20 min  Stress: Not on file  Social Connections: Not on file    Review of Systems  Constitutional:  Negative for chills, fatigue and fever.  HENT:  Negative for congestion, ear pain and sore throat.   Respiratory:  Negative for cough and shortness of breath.   Cardiovascular:  Negative for chest pain and palpitations.  Gastrointestinal:  Negative for abdominal pain, constipation, diarrhea, nausea and vomiting.  Endocrine: Negative for polydipsia, polyphagia and polyuria.  Genitourinary:  Negative for difficulty urinating and dysuria.   Musculoskeletal:  Positive for myalgias. Negative for arthralgias and back pain.  Skin:  Negative for rash.  Neurological:  Positive for weakness and numbness (both lower legs). Negative for headaches.  Psychiatric/Behavioral:  Negative for dysphoric mood. The patient is not nervous/anxious.      Objective:  BP 120/60   Pulse 80   Temp 97.6 F (36.4 C)   Resp 15   Ht '5\' 3"'  (1.6 m)   Wt 194 lb (88 kg)   SpO2 93%   BMI 34.37 kg/m      12/20/2021   11:36 AM 12/02/2021    2:06 PM 11/23/2021    9:10 AM  BP/Weight  Systolic BP 100 712 197  Diastolic BP 60 72 68  Wt. (Lbs) 194 196 196  BMI 34.37 kg/m2 34.72 kg/m2 34.72 kg/m2    Physical Exam Vitals reviewed.  Constitutional:      Appearance: Normal appearance. She is normal weight.  Neck:     Vascular: No carotid bruit.  Cardiovascular:     Rate and Rhythm: Normal rate and regular rhythm.     Heart sounds: Normal heart sounds.  Pulmonary:     Effort: Pulmonary effort is normal. No respiratory distress.     Breath sounds: Normal breath sounds.  Abdominal:     General: Abdomen is flat. Bowel sounds are normal.     Palpations: Abdomen is soft.     Tenderness: There is no abdominal tenderness.  Neurological:     Mental Status: She is alert and oriented to person, place, and time.     Sensory: Sensory deficit (BL feet) present.     Motor: No weakness.     Gait: Gait abnormal.  Psychiatric:        Mood and Affect: Mood normal.        Behavior: Behavior normal.     Diabetic Foot Exam -  Simple   No data filed      Lab Results  Component Value Date   WBC 6.7 11/23/2021   HGB 14.2 11/23/2021   HCT 42.4 11/23/2021   PLT 264 11/23/2021   GLUCOSE 103 (H) 11/26/2021   CHOL 208 (H) 11/23/2021   TRIG 183 (H) 11/23/2021   HDL 48 11/23/2021   LDLCALC 128 (H) 11/23/2021   ALT 12 11/26/2021   AST 14 11/26/2021   NA 136 11/26/2021   K 4.6 11/26/2021   CL 99 11/26/2021   CREATININE 1.00 11/26/2021   BUN 9 11/26/2021    CO2 22 11/26/2021   TSH 0.552 11/23/2021   HGBA1C 5.7 (H) 11/23/2021   MICROALBUR 30 09/25/2020      Assessment & Plan:   Problem List Items Addressed This Visit       Endocrine   Diabetic polyneuropathy associated with type 2 diabetes mellitus ([place]) - Primary    Order POV scooter.         Nervous and Auditory   Idiopathic progressive neuropathy    Continue lyrica 300 mg twice daily.  Order POV (scooter)      Other Visit Diagnoses     Need for immunization against influenza       Relevant Orders   Flu Vaccine QUAD High Dose(Fluad) (Completed)     .  No orders of the defined types were placed in this encounter.   Orders Placed This Encounter  Procedures   Flu Vaccine QUAD High Dose(Fluad)    Total time spent on today's visit was greater than 30 minutes, including both face-to-face time and nonface-to-face time personally spent on review of chart (labs and imaging), discussing labs and goals, discussing further work-up, treatment options, referrals to specialist if needed, reviewing outside records of pertinent, answering patient's questions, and coordinating care.  Follow-up: Return if symptoms worsen or fail to improve.  An After Visit Summary was printed and given to the patient.  Rochel Brome, MD [facility] [phone]

## 2021-12-23 ENCOUNTER — Encounter: Payer: Self-pay | Admitting: Family Medicine

## 2021-12-23 NOTE — Assessment & Plan Note (Signed)
Continue lyrica 300 mg twice daily.  Order POV (scooter)

## 2021-12-23 NOTE — Assessment & Plan Note (Signed)
Order POV scooter.

## 2021-12-28 ENCOUNTER — Other Ambulatory Visit: Payer: Self-pay | Admitting: Physician Assistant

## 2021-12-28 DIAGNOSIS — L2089 Other atopic dermatitis: Secondary | ICD-10-CM

## 2021-12-29 ENCOUNTER — Ambulatory Visit: Payer: Self-pay

## 2021-12-29 NOTE — Patient Outreach (Signed)
  Care Coordination   Initial Visit Note   12/29/2021 Name: Hailey Greene MRN: [id number] DOB: 1950-11-28  Hailey Greene is a 71 y.o. year old female who sees Cox, Kirsten, MD for primary care. I spoke with  Hailey Greene by phone today.  What matters to the patients health and wellness today?  Neuropathy. Tingling and stabbing pain in both feet. Right is the worst.  DM under control.   Reports trying to get an electric scooter. Uses icy hot to help go to sleep. Hailey Greene is not helping.    Goals Addressed               This Visit's Progress     Impaired mobility (pt-stated)        Care Coordination Interventions: Reviewed need for fall prevention. Uses walker if she has too.  Encouraged more use vs holding on to husband  Reviewed that she furniture surfs Takes motrin at night . Encouraged patient to take motrin 3 times a day as needed.  Reviewed interest in therapy.  Will mail [facility] exercise packet. Patient is interested in DM shoes and podiatry referral.  Secured chat sent to MD Provided my contact information to patient and encouraged her to call me if needed.           SDOH assessments and interventions completed:  Yes  SDOH Interventions Today    Flowsheet Row Most Recent Value  SDOH Interventions   Food Insecurity Interventions Intervention Not Indicated  Housing Interventions Intervention Not Indicated  Transportation Interventions Intervention Not Indicated  Utilities Interventions Intervention Not Indicated        Care Coordination Interventions Activated:  Yes  Care Coordination Interventions:  Yes, provided   Follow up plan: Follow up call scheduled for 01/31/2022    Encounter Outcome:  Pt. Visit Completed   Hailey Rand, RN, BSN, CEN [facility] Coordinator [phone]

## 2022-01-17 DIAGNOSIS — G4733 Obstructive sleep apnea (adult) (pediatric): Secondary | ICD-10-CM | POA: Diagnosis not present

## 2022-01-18 DIAGNOSIS — N3941 Urge incontinence: Secondary | ICD-10-CM | POA: Insufficient documentation

## 2022-01-18 NOTE — Assessment & Plan Note (Signed)
Continue vesicare.  Wearing adult disposable underwear. 200 for 3 months.

## 2022-01-25 ENCOUNTER — Other Ambulatory Visit: Payer: Self-pay | Admitting: Family Medicine

## 2022-01-25 DIAGNOSIS — J301 Allergic rhinitis due to pollen: Secondary | ICD-10-CM

## 2022-01-25 NOTE — Telephone Encounter (Signed)
Medication was stopped months ago and never restarted.

## 2022-01-31 ENCOUNTER — Other Ambulatory Visit: Payer: Medicare Other

## 2022-01-31 NOTE — Patient Outreach (Signed)
  Care Coordination   Follow Up Visit Note   02/01/2022 Name: ELOWEN DELLES MRN: [id number] DOB: 05-14-1950  NOUR FEINMAN is a 71 y.o. year old female who sees Cox, Kirsten, MD for primary care. I spoke with  Clarisa Fling by phone today.  What matters to the patients health and wellness today?  Patient reports that she missing called from [facility] care on Friday about her scooter. She reports she is planning to call them back today. Reports severe pain in feet. 10/10.  Patient reports that the Lyrica is not working.  Reports DM is under good control. Patient reports that she no longer has MY CHART since her phone crashed and needs help setting this up.      Goals Addressed               This Visit's Progress     Impaired mobility (pt-stated)        Care Coordination Interventions: Assessed patients concerns for her neuropathy.  Pain is severe. Meds not working Encouraged patient to use MY CHART to communicate with MD. Patient needs My chart reset.  Message sent to Sabino Donovan at MD Office to get code for patient.  Patient voices that she will call [employer] back today about her scooter.   02/01/2022   Confirmed with MD office that MY chart is set up . Patient informed.           SDOH assessments and interventions completed:  No     Care Coordination Interventions Activated:  Yes  Care Coordination Interventions:  Yes, provided   Follow up plan: Follow up call scheduled for 02/28/2022    Encounter Outcome:  Pt. Visit Completed   Rowe Pavy, RN, BSN, CEN [facility] Coordinator [phone]

## 2022-02-01 ENCOUNTER — Other Ambulatory Visit: Payer: Self-pay

## 2022-02-01 ENCOUNTER — Telehealth: Payer: Self-pay

## 2022-02-01 DIAGNOSIS — K219 Gastro-esophageal reflux disease without esophagitis: Secondary | ICD-10-CM

## 2022-02-01 DIAGNOSIS — J4520 Mild intermittent asthma, uncomplicated: Secondary | ICD-10-CM

## 2022-02-01 MED ORDER — OMEPRAZOLE 20 MG PO CPDR: 20.0000 mg | DELAYED_RELEASE_CAPSULE | Freq: Every day | ORAL | 1 refills | Status: DC

## 2022-02-01 MED ORDER — ROSUVASTATIN CALCIUM 40 MG PO TABS: 40.0000 mg | ORAL_TABLET | Freq: Every day | ORAL | 0 refills | Status: DC

## 2022-02-01 MED ORDER — BUDESONIDE-FORMOTEROL FUMARATE 160-4.5 MCG/ACT IN AERO: 2.0000 | INHALATION_SPRAY | Freq: Two times a day (BID) | RESPIRATORY_TRACT | 3 refills | Status: DC

## 2022-02-01 MED ORDER — CYANOCOBALAMIN 2500 MCG SL SUBL: 2500.0000 ug | SUBLINGUAL_TABLET | Freq: Every day | SUBLINGUAL | 1 refills | Status: DC

## 2022-02-01 MED ORDER — ICOSAPENT ETHYL 1 G PO CAPS: 2.0000 g | ORAL_CAPSULE | Freq: Two times a day (BID) | ORAL | 0 refills | Status: DC

## 2022-02-01 NOTE — Chronic Care Management (AMB) (Cosign Needed Addendum)
Chronic Care Management Pharmacy Assistant   Name: Hailey Greene  MRN: [id number] DOB: March 13, 1951  Reason for Encounter: Medication Review/ Medication coordination  Recent office visits:  12-29-2021 Thana Ates, RN (CCM).  12-20-2021 Rochel Brome, MD. Flu vaccine given.   12-02-2021 Rochel Brome, MD. START amoxicillin 875 mg twice daily and promethazine 25 mg every 6 hours PRN.  11-23-2021 Rochel Brome, MD. BUN/Creatinine Ratio= 9, Potassium= 5.5. A1C= 5.7. Cholesterol, Total= 208, Trig= 183, LDL= 128. START ambien 5 mg at bedtime as needed. STOP Eszopiclone and trazodone.  Recent consult visits:  None  Hospital visits:  None in previous 6 months  Medications: Outpatient Encounter Medications as of 02/01/2022  Medication Sig   ASPIRIN 81 PO Take 81 mg by mouth daily.   blood glucose meter kit and supplies Dispense based on patient and insurance preference. Use up to four times daily as directed. (FOR ICD-10 E10.9, E11.9).   Blood Glucose Monitoring Suppl (ONETOUCH VERIO) w/Device KIT Use daily to check FBS   budesonide-formoterol (SYMBICORT) 160-4.5 MCG/ACT inhaler Inhale 2 puffs into the lungs 2 (two) times daily.   Cyanocobalamin 2500 MCG SUBL Place 1 tablet (2,500 mcg total) under the tongue daily.   dicyclomine (BENTYL) 20 MG tablet Take 1 tablet (20 mg total) by mouth every 6 (six) hours.   Eszopiclone 3 MG TABS Take 1 tablet (3 mg total) by mouth at bedtime. Take immediately before bedtime   ezetimibe (ZETIA) 10 MG tablet TAKE 1 TABLET BY MOUTH EVERY DAY   fluticasone (FLONASE) 50 MCG/ACT nasal spray SPRAY 2 SPRAYS INTO EACH NOSTRIL EVERY DAY   glucose blood (ONETOUCH VERIO) test strip E11.42 Use new test strip each time when checking FBS   Lancets (ONETOUCH DELICA PLUS [id number]) MISC E 11.42 Use new lancet each time when checking FBS   loratadine (CLARITIN) 10 MG tablet Take 1 tablet (10 mg total) by mouth daily.   Multiple Vitamin (MULTIVITAMIN WITH MINERALS)  TABS tablet Take 1 tablet by mouth daily.   omeprazole (PRILOSEC) 20 MG capsule Take 1 capsule (20 mg total) by mouth daily.   ondansetron (ZOFRAN) 4 MG tablet TAKE ONE TABLET BY MOUTH every SIX hours AS NEEDED FOR NAUSEA AND VOMITING   phentermine 15 MG capsule Take 1 capsule (15 mg total) by mouth every morning.   pregabalin (LYRICA) 300 MG capsule Take 1 capsule (300 mg total) by mouth 2 (two) times daily.   promethazine (PHENERGAN) 25 MG tablet Take 1 tablet (25 mg total) by mouth every 6 (six) hours as needed for nausea or vomiting.   ramipril (ALTACE) 2.5 MG capsule TAKE ONE CAPSULE BY MOUTH ONCE DAILY   rOPINIRole (REQUIP) 3 MG tablet TAKE 1 TABLET BY MOUTH ONCE DAILY 1-3 HOURS BEFORE  BEDTIME   rosuvastatin (CRESTOR) 40 MG tablet Take 1 tablet (40 mg total) by mouth daily.   Semaglutide (RYBELSUS) 7 MG TABS Take 7 mg by mouth daily.   solifenacin (VESICARE) 5 MG tablet Take 1 tablet (5 mg total) by mouth daily.   topiramate (TOPAMAX) 50 MG tablet Take 1 tablet (50 mg total) by mouth daily.   triamcinolone cream (KENALOG) 0.1 % apply one application TOPICALLY twice daily   VASCEPA 1 g capsule TAKE TWO CAPSULES BY MOUTH TWICE DAILY   vitamin B-12 1000 MCG tablet Take 1 tablet (1,000 mcg total) by mouth daily.   vortioxetine HBr (TRINTELLIX) 20 MG TABS tablet Take 1 tablet (20 mg total) by mouth daily.   zolpidem (AMBIEN)  5 MG tablet Take 1 tablet (5 mg total) by mouth at bedtime as needed for sleep.   No facility-administered encounter medications on file as of 02/01/2022.  Reviewed chart for medication changes ahead of medication coordination call.  No Consults, or hospital visits since last care coordination call/Pharmacist visit.   No medication changes indicated   BP Readings from Last 3 Encounters:  12/20/21 120/60  12/02/21 132/72  11/23/21 122/68    Lab Results  Component Value Date   HGBA1C 5.7 (H) 11/23/2021     Patient obtains medications through Vials  90 Days    Last adherence delivery included: Rosuvastatin 69m-1 daily  Omeprazole 226m1 daily  Dicyclomine 2022m1 tab every 6 hours          Rybelsus 7mg83m tab once daily Budes/formot inhaler 160/4.5-2 puffs twice daily        Loratadine 10mg21mab once daily       Solifenacin 5mg-12mb once daily        Triamcinolone 0.10% apply topically twice daily Pregablin 200mg-t21mimes daily [id number] [id number] [place] [id number] [phone] transfer Trazodone 50mg-.5-64mb at bedtime as needed        Vascepa 1gm- 2 capsules twice daily             Vitamin B12 2500mcg-1 t53mnder tongue daily Ramipril 2.5mg-1 caps63m once daily  Trintellix 20mg-1 tab 33m daily           Ropinirole 3mg- Take 1 102mmouth once daily Topiramate 50mg-(50 mg t107m) at bedtime   Patient declined (meds) last delivery: Phentermine 15mg daily- Pt61mked [facility] Eszopiclone 3mg 1 tab at be56mme prn- Dose increase sent to [id number] -picked up 14437/23   Patient is due for next adherence delivery on: 02-11-2022  Called patient and reviewed medications and coordinated delivery.  This delivery to include: Rosuvastatin 40mg-1 daily  Ome12mole 20mg-1 daily  Dicy64mine 20mg- 1 tab every 659mrs          Rybelsus 7mg- 1 tab once dail86mudes/formot inhaler 160/4.5-2 puffs twice daily        Loratadine 10mg-1 tab once daily30m   Solifenacin 5mg-1 tab once daily  57m  Topiramate 50mg- 1 daily Vitamin B21m500mcg-1 tab under tongue65mly  Trintellix 20mg-1 tab once daily    4mipril 2.5mg-1 capsule once daily  69mpinirole 3mg-  1 tab 1-3 hours befor64medtime Lyrica 100 mg- 1 capsule twice daily Zofran 4 mg- 1 tab every 6 hours PRN Trazodone 50 mg- .5-1 tab at bedtime as needed        Vascepa 1gm- 2 capsules twice daily    No short fill needed  Coordinated acute fill for (Eszopiclone 3mg 1 tab at bedtime) to be 15mlivered on 02-04-2022. Sent message to PCP for refill.  02-04-2022: Unable to get RX from PCP in time for delivery today. [facility] and [facility] is unable to be [facility] since prescription was never filled. Contacted patient and she agreed [facility]. [facility] and prescription will be filled today. Patient informed. Updated chasity  Patient declined the following medications: Phentermine- Isn't due until 02-24-22 Test strips/ lancets- Filled 12-13-2021 90 DS Lyrica 100 mg twice daily- Dose increased to 300 mg twice daily. [facility] 01-24-2022 30 DS. Requested PCP to send  RX to [facility]. Ambien- [facility] Multivitamin- OTC Flonase- Not needed Zetia- Fills at [facility]  Patient needs refills for: Refill request sent Vascepa Vit b12 Budes/formot inhaler  Rosuvastatin Omeprazole  Confirmed delivery date of 02-11-2022 advised patient that pharmacy will contact them the morning of delivery.  Care Gaps: Colonoscopy overdue Lung cancer screening Covid booster overdue Yearly foot exam overdue  Star Rating Drugs: Rosuvastatin 40 mg- Last filled 11-10-2021 90 DS. Previous filled 08-12-2021 90 DS Rybelsus 7 mg- Last filled 11-10-2021 90 DS. Previous filled 08-12-2021 90 DS  [facility] Clinical Pharmacist Assistant [phone]

## 2022-02-03 ENCOUNTER — Other Ambulatory Visit: Payer: Self-pay

## 2022-02-03 DIAGNOSIS — E1142 Type 2 diabetes mellitus with diabetic polyneuropathy: Secondary | ICD-10-CM

## 2022-02-03 DIAGNOSIS — G603 Idiopathic progressive neuropathy: Secondary | ICD-10-CM

## 2022-02-03 DIAGNOSIS — F5101 Primary insomnia: Secondary | ICD-10-CM

## 2022-02-04 ENCOUNTER — Other Ambulatory Visit: Payer: Self-pay | Admitting: Family Medicine

## 2022-02-04 DIAGNOSIS — F5101 Primary insomnia: Secondary | ICD-10-CM

## 2022-02-05 MED ORDER — PREGABALIN 300 MG PO CAPS: 300.0000 mg | ORAL_CAPSULE | Freq: Two times a day (BID) | ORAL | 2 refills | Status: DC

## 2022-02-05 MED ORDER — ESZOPICLONE 3 MG PO TABS: 3.0000 mg | ORAL_TABLET | Freq: Every day | ORAL | 1 refills | Status: DC

## 2022-02-10 ENCOUNTER — Other Ambulatory Visit: Payer: Self-pay | Admitting: Family Medicine

## 2022-02-10 MED ORDER — TOPIRAMATE 50 MG PO TABS: 50.0000 mg | ORAL_TABLET | Freq: Every day | ORAL | 1 refills | Status: DC

## 2022-02-15 ENCOUNTER — Telehealth: Payer: Self-pay

## 2022-02-15 DIAGNOSIS — R1110 Vomiting, unspecified: Secondary | ICD-10-CM | POA: Diagnosis not present

## 2022-02-15 DIAGNOSIS — J069 Acute upper respiratory infection, unspecified: Secondary | ICD-10-CM | POA: Diagnosis not present

## 2022-02-15 NOTE — Telephone Encounter (Signed)
Hailey Greene called with complaints of head congestion, nausea and vomiting.  Her symptoms started about 48 hours ago.  We do not have availability in the office today,  she is going to go to MyChart for a virtual visit.

## 2022-02-17 ENCOUNTER — Other Ambulatory Visit: Payer: Self-pay

## 2022-02-17 ENCOUNTER — Encounter (HOSPITAL_COMMUNITY): Payer: Self-pay

## 2022-02-17 ENCOUNTER — Emergency Department (HOSPITAL_COMMUNITY)
Admission: EM | Admit: 2022-02-17 | Discharge: 2022-02-17 | Disposition: A | Discharge status: Home / Self Care | Payer: Medicare Other | Attending: Emergency Medicine | Admitting: Emergency Medicine

## 2022-02-17 DIAGNOSIS — R110 Nausea: Secondary | ICD-10-CM | POA: Diagnosis not present

## 2022-02-17 DIAGNOSIS — E119 Type 2 diabetes mellitus without complications: Secondary | ICD-10-CM | POA: Diagnosis not present

## 2022-02-17 DIAGNOSIS — G4733 Obstructive sleep apnea (adult) (pediatric): Secondary | ICD-10-CM | POA: Diagnosis not present

## 2022-02-17 DIAGNOSIS — Z7984 Long term (current) use of oral hypoglycemic drugs: Secondary | ICD-10-CM | POA: Insufficient documentation

## 2022-02-17 DIAGNOSIS — Z7982 Long term (current) use of aspirin: Secondary | ICD-10-CM | POA: Diagnosis not present

## 2022-02-17 DIAGNOSIS — D72829 Elevated white blood cell count, unspecified: Secondary | ICD-10-CM | POA: Diagnosis not present

## 2022-02-17 DIAGNOSIS — R112 Nausea with vomiting, unspecified: Secondary | ICD-10-CM | POA: Insufficient documentation

## 2022-02-17 LAB — COMPREHENSIVE METABOLIC PANEL
ALT: 12 U/L (ref 0–44)
AST: 15 U/L (ref 15–41)
Albumin: 3.9 g/dL (ref 3.5–5.0)
Alkaline Phosphatase: 86 U/L (ref 38–126)
Anion gap: 8 (ref 5–15)
BUN: 26 mg/dL — ABNORMAL HIGH (ref 8–23)
CO2: 20 mmol/L — ABNORMAL LOW (ref 22–32)
Calcium: 9.3 mg/dL (ref 8.9–10.3)
Chloride: 106 mmol/L (ref 98–111)
Creatinine, Ser: 1.01 mg/dL — ABNORMAL HIGH (ref 0.44–1.00)
GFR, Estimated: 60 mL/min — ABNORMAL LOW (ref >60)
Glucose, Bld: 108 mg/dL — ABNORMAL HIGH (ref 70–99)
Potassium: 3.3 mmol/L — ABNORMAL LOW (ref 3.5–5.1)
Sodium: 134 mmol/L — ABNORMAL LOW (ref 135–145)
Total Bilirubin: 0.8 mg/dL (ref 0.3–1.2)
Total Protein: 7 g/dL (ref 6.5–8.1)

## 2022-02-17 LAB — URINALYSIS, ROUTINE W REFLEX MICROSCOPIC
Glucose, UA: NEGATIVE mg/dL
Hgb urine dipstick: NEGATIVE
Ketones, ur: 40 mg/dL — AB
Leukocytes,Ua: NEGATIVE
Nitrite: NEGATIVE
Protein, ur: 30 mg/dL — AB
Specific Gravity, Urine: 1.025 (ref 1.005–1.030)
pH: 6 (ref 5.0–8.0)

## 2022-02-17 LAB — CBC
HCT: 45.2 % (ref 36.0–46.0)
Hemoglobin: 15 g/dL (ref 12.0–15.0)
MCH: 29 pg (ref 26.0–34.0)
MCHC: 33.2 g/dL (ref 30.0–36.0)
MCV: 87.4 fL (ref 80.0–100.0)
Platelets: 215 10*3/uL (ref 150–400)
RBC: 5.17 MIL/uL — ABNORMAL HIGH (ref 3.87–5.11)
RDW: 13.6 % (ref 11.5–15.5)
WBC: 11.5 10*3/uL — ABNORMAL HIGH (ref 4.0–10.5)
nRBC: 0 % (ref 0.0–0.2)

## 2022-02-17 LAB — URINALYSIS, MICROSCOPIC (REFLEX)

## 2022-02-17 LAB — LIPASE, BLOOD: Lipase: 23 U/L (ref 11–51)

## 2022-02-17 MED ORDER — SODIUM CHLORIDE 0.9% FLUSH
3.0000 mL | Freq: Once | INTRAVENOUS | Status: AC
  Administered 2022-02-17: 3 mL via INTRAVENOUS

## 2022-02-17 MED ORDER — POTASSIUM CHLORIDE CRYS ER 20 MEQ PO TBCR
20.0000 meq | EXTENDED_RELEASE_TABLET | Freq: Once | ORAL | Status: AC
  Administered 2022-02-17: 20 meq via ORAL
  Filled 2022-02-17: qty 1

## 2022-02-17 MED ORDER — SODIUM CHLORIDE 0.9 % IV BOLUS
500.0000 mL | Freq: Once | INTRAVENOUS | Status: AC
  Administered 2022-02-17: 500 mL via INTRAVENOUS

## 2022-02-17 NOTE — Discharge Instructions (Addendum)
We gave you IV fluids, checked your labs and an EKG. Your potassium was a little bit low, so we gave you a potassium pill.  Your labs overall looked okay.  Your urinalysis did not look like you have a urinary tract infection.  I have included information on ways to manage nausea.  It will be very important for you to eat and drink by mouth.  You can try fluids with electrolytes, such as Gatorade or Powerade-I would recommend the regular version of these mixed 50-50 with water.  Small meals, simple foods should be helpful.  Please follow-up with your primary care provider early next week.  If you have abdominal pain, do not pass bowel movements, have blood in your vomit or stool, return to the emergency department.

## 2022-02-17 NOTE — ED Provider Notes (Signed)
I saw and evaluated the patient, reviewed the resident's note and I agree with the findings and plan.  Pertinent History: This patient is an obese 71 year old female, very pleasant, states that she got sick Sunday night with lots of vomiting.  The vomiting lasted a couple of days and then stopped over the last 48 hours she has not had any further vomiting.  She still has mild nausea but is improved with Zofran.  She was also given doxycycline by an urgent care for an unknown reason.  She has no diarrhea, no urinary symptoms, no fevers and on my exam is pretty unremarkable  Pertinent Exam findings: Patient appears to be slightly dehydrated on her mucous membranes but has a totally nontender abdomen, she is not tachycardic and has normal lung sounds, no edema, no rash, normal neurologic exam and level of alertness.  Anticipate hydration, screening labs to look for renal dysfunction or hypokalemia or other cause of fatigue after vomiting  I was personally present and directly supervised the following procedures:  Medical evaluation and resuscitation  I personally interpreted the EKG as well as the resident and agree with the interpretation on the resident's chart.  Final diagnoses:  Nausea      Eber Hong, MD 02/19/22 1056

## 2022-02-17 NOTE — ED Notes (Signed)
Pt attempted to obtain urine sample. Pt urine amount minimal and dark. EDP aware

## 2022-02-17 NOTE — ED Provider Notes (Signed)
[facility] EMERGENCY DEPARTMENT Provider Note   CSN: [id number] Arrival date & time: 02/17/22  1403     History  Chief Complaint  Patient presents with   feeling sick    Hailey Greene is a 71 y.o. female.  71 year old female with past medical history significant for diabetes, prior appendectomy, prior cholecystectomy, prior hernia repair presenting with decreased appetite and difficulty sleeping for the past 4 days following 24 hours of nausea and vomiting 4 days ago.  She is accompanied by her husband who assist in giving the history.  They report that Sunday night going into many many patient had multiple episodes of NBNB emesis.  They deny fever at this time.  They endorse eating some potatoes and green beans that "tasted off."  Patient's husband reports he ate 1-2 bites of this and then did not complete it because of how it tasted, he has not been sick.  She ate significantly more.  She was evaluated at urgent care on Monday, given Zofran and doxycycline.  She states has been TSF prescribed.  She has not had any vomiting since Monday.  She has had difficulty sleeping, endorses fatigue, and has had decreased appetite since then.  She has had normal bowel movements, last bowel movement was this morning.  She denies abdominal pain and that she has not had any abdominal pain throughout this entire event.  She denies diarrhea.  She denies hematemesis, hematochezia, melena, hematuria.  She denies dysuria, frequency, urgency.  She denies past medical history of kidney problems, heart problems.  She does endorse history of diabetes.  She states has been care blood sugar at home and that has been okay.        Home Medications Prior to Admission medications   Medication Sig Start Date End Date Taking? Authorizing Provider  ASPIRIN 81 PO Take 81 mg by mouth daily.    [provider]  blood glucose meter kit and supplies Dispense based on patient and insurance  preference. Use up to four times daily as directed. (FOR ICD-10 E10.9, E11.9). 08/10/21   Rip Harbour, NP  Blood Glucose Monitoring Suppl (ONETOUCH VERIO) w/Device KIT Use daily to check FBS 06/24/20   Cox, Kirsten, MD  budesonide-formoterol [facility]) 160-4.5 MCG/ACT inhaler Inhale 2 puffs into the lungs 2 (two) times daily. 02/01/22   CoxElnita Maxwell, MD  Cyanocobalamin 2500 MCG SUBL Place 1 tablet (2,500 mcg total) under the tongue daily. 02/01/22   Cox, Elnita Maxwell, MD  dicyclomine (BENTYL) 20 MG tablet Take 1 tablet (20 mg total) by mouth every 6 (six) hours. 11/03/21   Cox, Elnita Maxwell, MD  Eszopiclone 3 MG TABS Take 1 tablet (3 mg total) by mouth at bedtime. Take immediately before bedtime 02/05/22   Cox, Kirsten, MD  Eszopiclone 3 MG TABS TAKE ONE TABLET BY MOUTH EVERYDAY AT BEDTIME 02/05/22   Cox, Kirsten, MD  ezetimibe (ZETIA) 10 MG tablet TAKE 1 TABLET BY MOUTH EVERY DAY 01/25/22   Cox, Kirsten, MD  fluticasone [facility]) 50 MCG/ACT nasal spray SPRAY 2 SPRAYS INTO EACH NOSTRIL EVERY DAY 01/25/22   Cox, Kirsten, MD  glucose blood (ONETOUCH VERIO) test strip E11.42 Use new test strip each time when checking FBS 08/10/21   Rip Harbour, NP  icosapent Ethyl (VASCEPA) 1 g capsule Take 2 capsules (2 g total) by mouth 2 (two) times daily. 02/01/22   Cox, Elnita Maxwell, MD  Lancets [facility] DELICA PLUS [id number]) MISC E 11.42 Use new lancet each time when checking  FBS 08/10/21   Rip Harbour, NP  loratadine (CLARITIN) 10 MG tablet Take 1 tablet (10 mg total) by mouth daily. 08/10/21   Rip Harbour, NP  Multiple Vitamin (MULTIVITAMIN WITH MINERALS) TABS tablet Take 1 tablet by mouth daily.    [provider]  omeprazole (PRILOSEC) 20 MG capsule Take 1 capsule (20 mg total) by mouth daily. 02/01/22   Cox, Elnita Maxwell, MD  ondansetron (ZOFRAN) 4 MG tablet TAKE ONE TABLET BY MOUTH every SIX hours AS NEEDED FOR NAUSEA AND VOMITING 09/27/21   Cox, Kirsten, MD  phentermine 15 MG capsule Take 1 capsule (15  mg total) by mouth every morning. 10/29/21   Cox, Elnita Maxwell, MD  pregabalin (LYRICA) 300 MG capsule Take 1 capsule (300 mg total) by mouth 2 (two) times daily. 02/05/22   CoxElnita Maxwell, MD  promethazine (PHENERGAN) 25 MG tablet Take 1 tablet (25 mg total) by mouth every 6 (six) hours as needed for nausea or vomiting. 12/02/21   Cox, Elnita Maxwell, MD  ramipril (ALTACE) 2.5 MG capsule TAKE ONE CAPSULE BY MOUTH ONCE DAILY 11/10/21   Cox, Kirsten, MD  rOPINIRole (REQUIP) 3 MG tablet TAKE 1 TABLET BY MOUTH ONCE DAILY 1-3 HOURS BEFORE  BEDTIME 11/03/21   Cox, Kirsten, MD  rosuvastatin (CRESTOR) 40 MG tablet Take 1 tablet (40 mg total) by mouth daily. 02/01/22   Cox, Kirsten, MD  Semaglutide (RYBELSUS) 7 MG TABS Take 7 mg by mouth daily. 11/03/21   Cox, Elnita Maxwell, MD  solifenacin (VESICARE) 5 MG tablet Take 1 tablet (5 mg total) by mouth daily. 11/03/21   Cox, Elnita Maxwell, MD  topiramate (TOPAMAX) 50 MG tablet Take 1 tablet (50 mg total) by mouth daily. 02/10/22   CoxElnita Maxwell, MD  triamcinolone cream (KENALOG) 0.1 % apply one application TOPICALLY twice daily 12/28/21   Marge Duncans, PA-C  vitamin B-12 1000 MCG tablet Take 1 tablet (1,000 mcg total) by mouth daily. 07/22/19   Raiford Noble Latif, DO  vortioxetine HBr (TRINTELLIX) 20 MG TABS tablet Take 1 tablet (20 mg total) by mouth daily. 11/03/21   Cox, Elnita Maxwell, MD  zolpidem (AMBIEN) 5 MG tablet Take 1 tablet (5 mg total) by mouth at bedtime as needed for sleep. 11/23/21   Rochel Brome, MD      Allergies    Patient has no known allergies.    Review of Systems   Review of Systems  Physical Exam Updated Vital Signs BP (!) 140/84 (BP Location: Right Arm)   Pulse (!) 109   Temp 97.9 F (36.6 C)   Resp 18   SpO2 99%  Physical Exam Vitals and nursing note reviewed.  Constitutional:      General: She is not in acute distress.    Appearance: She is well-developed.  HENT:     Head: Normocephalic and atraumatic.  Eyes:     General: No scleral icterus.     Conjunctiva/sclera: Conjunctivae normal.  Cardiovascular:     Rate and Rhythm: Normal rate and regular rhythm.     Heart sounds: Normal heart sounds. No murmur heard. Pulmonary:     Effort: Pulmonary effort is normal. No respiratory distress.     Breath sounds: Normal breath sounds.  Abdominal:     General: Bowel sounds are normal. There is no distension.     Palpations: Abdomen is soft. There is no mass.     Tenderness: There is no abdominal tenderness. There is no guarding or rebound. Right CVA tenderness: very slight.    Comments:  Very slight right CVA tenderness  Musculoskeletal:        General: No swelling.     Cervical back: Neck supple.     Right lower leg: No edema.     Left lower leg: No edema.  Skin:    General: Skin is warm and dry.     Capillary Refill: Capillary refill takes less than 2 seconds.  Neurological:     Mental Status: She is alert and oriented to person, place, and time.  Psychiatric:        Mood and Affect: Mood normal.     ED Results / Procedures / Treatments   Labs (all labs ordered are listed, but only abnormal results are displayed) Labs Reviewed  LIPASE, BLOOD  COMPREHENSIVE METABOLIC PANEL  CBC  URINALYSIS, ROUTINE W REFLEX MICROSCOPIC    EKG None  Radiology No results found.  Procedures Procedures    Medications Ordered in ED Medications  sodium chloride flush (NS) 0.9 % injection 3 mL (has no administration in time range)    ED Course/ Medical Decision Making/ A&P                           Medical Decision Making Patient is overall well-appearing.  On my evaluation, vitals within normal limits.  Abdominal exam benign, normal bowel movements, I do not think this is obstruction.  I do not think a CT scan is warranted at this time..  Patient has had no emesis since Sunday, however she had decreased appetite.  I think this is most likely fatigue following gastroenteritis secondary to food poisoning.  However with history of  diabetes, hyperglycemia and hypoglycemia are also on the differential.  Patient does have history of neuropathy, this could be new onset gastroparesis.  UTI and pyelonephritis are also in the differential.  However patient has had no fever and denies urinary symptoms.  As she has had decreased oral intake, does not have history of heart failure, will provide fluids.  Labs ordered.  Amount and/or Complexity of Data Reviewed Labs: ordered. Decision-making details documented in ED Course. ECG/medicine tests: ordered and independent interpretation performed. Decision-making details documented in ED Course.   EKG showed sinus rhythm, ventricular rate 70 bpm.  QTc 466 ms.  Axes and intervals are overall unremarkable.  No ST elevations, no ST depressions.  T wave inversion in lead III.  Overall nonischemic EKG.  CBC showed very slight elevation in WBC at 11.5.  Hemoglobin within normal limits.  Lipase within normal limits.  CMP notable for slightly low potassium at 3.3 (repletion ordered), slightly low bicarb at 20.  Glucose within normal limits.  Slight elevation in creatinine and BUN.  I think this is likely secondary to decreased p.o. intake.  Fluid bolus given.  Urinalysis with ketones, bilirubin, few bacteria.  Patient remained hemodynamically stable.  Overall, patient is well-appearing.  Her labs are as noted above.  I discussed with her eating small meals, trying crackers, simple fluids, and advancing her diet as she is able to tolerate.  I encouraged her to follow-up with her primary care provider as soon as she is able.  Return precautions given.        Final Clinical Impression(s) / ED Diagnoses Final diagnoses:  None    Rx / DC Orders ED Discharge Orders     None         Luster Landsberg, MD 02/17/22 2706    Noemi Chapel, MD 02/19/22 1056

## 2022-02-17 NOTE — ED Triage Notes (Signed)
Pt arrived POV from home stating she does not feel good and must be sick. Pt was seen at an urgent care at the beginning of the week and given doxycyline and zofran and told to take it but unsure why. Pt was seen there for N/V. That has subsided but she states she is unable to eat or drink anything d/t loss of appetite and just not feeling better. Pt states she was tested for COVID and Flu and it was negative.

## 2022-02-19 ENCOUNTER — Emergency Department (HOSPITAL_COMMUNITY)
Admission: EM | Admit: 2022-02-19 | Discharge: 2022-02-19 | Disposition: A | Discharge status: Home / Self Care | Payer: Medicare Other | Attending: Emergency Medicine | Admitting: Emergency Medicine

## 2022-02-19 ENCOUNTER — Other Ambulatory Visit: Payer: Self-pay

## 2022-02-19 ENCOUNTER — Emergency Department (HOSPITAL_COMMUNITY): Payer: Medicare Other

## 2022-02-19 DIAGNOSIS — E876 Hypokalemia: Secondary | ICD-10-CM | POA: Insufficient documentation

## 2022-02-19 DIAGNOSIS — Z7982 Long term (current) use of aspirin: Secondary | ICD-10-CM | POA: Insufficient documentation

## 2022-02-19 DIAGNOSIS — E119 Type 2 diabetes mellitus without complications: Secondary | ICD-10-CM | POA: Insufficient documentation

## 2022-02-19 DIAGNOSIS — R519 Headache, unspecified: Secondary | ICD-10-CM | POA: Insufficient documentation

## 2022-02-19 DIAGNOSIS — N281 Cyst of kidney, acquired: Secondary | ICD-10-CM | POA: Diagnosis not present

## 2022-02-19 DIAGNOSIS — I10 Essential (primary) hypertension: Secondary | ICD-10-CM | POA: Diagnosis not present

## 2022-02-19 DIAGNOSIS — R112 Nausea with vomiting, unspecified: Secondary | ICD-10-CM | POA: Insufficient documentation

## 2022-02-19 DIAGNOSIS — Z20822 Contact with and (suspected) exposure to covid-19: Secondary | ICD-10-CM | POA: Insufficient documentation

## 2022-02-19 LAB — COMPREHENSIVE METABOLIC PANEL
ALT: 10 U/L (ref 0–44)
AST: 16 U/L (ref 15–41)
Albumin: 3.7 g/dL (ref 3.5–5.0)
Alkaline Phosphatase: 83 U/L (ref 38–126)
Anion gap: 16 — ABNORMAL HIGH (ref 5–15)
BUN: 23 mg/dL (ref 8–23)
CO2: 22 mmol/L (ref 22–32)
Calcium: 9.5 mg/dL (ref 8.9–10.3)
Chloride: 98 mmol/L (ref 98–111)
Creatinine, Ser: 0.93 mg/dL (ref 0.44–1.00)
GFR, Estimated: >60 mL/min (ref >60)
Glucose, Bld: 115 mg/dL — ABNORMAL HIGH (ref 70–99)
Potassium: 3.3 mmol/L — ABNORMAL LOW (ref 3.5–5.1)
Sodium: 136 mmol/L (ref 135–145)
Total Bilirubin: 0.9 mg/dL (ref 0.3–1.2)
Total Protein: 6.9 g/dL (ref 6.5–8.1)

## 2022-02-19 LAB — CBC
HCT: 44.7 % (ref 36.0–46.0)
Hemoglobin: 14.9 g/dL (ref 12.0–15.0)
MCH: 29.1 pg (ref 26.0–34.0)
MCHC: 33.3 g/dL (ref 30.0–36.0)
MCV: 87.3 fL (ref 80.0–100.0)
Platelets: 249 10*3/uL (ref 150–400)
RBC: 5.12 MIL/uL — ABNORMAL HIGH (ref 3.87–5.11)
RDW: 13.7 % (ref 11.5–15.5)
WBC: 11 10*3/uL — ABNORMAL HIGH (ref 4.0–10.5)
nRBC: 0 % (ref 0.0–0.2)

## 2022-02-19 LAB — RESP PANEL BY RT-PCR (FLU A&B, COVID) ARPGX2
Influenza A by PCR: NEGATIVE
Influenza B by PCR: NEGATIVE
SARS Coronavirus 2 by RT PCR: NEGATIVE

## 2022-02-19 LAB — LIPASE, BLOOD: Lipase: 32 U/L (ref 11–51)

## 2022-02-19 MED ORDER — POTASSIUM CHLORIDE CRYS ER 20 MEQ PO TBCR
40.0000 meq | EXTENDED_RELEASE_TABLET | Freq: Once | ORAL | Status: AC
  Administered 2022-02-19: 40 meq via ORAL
  Filled 2022-02-19: qty 2

## 2022-02-19 MED ORDER — METOCLOPRAMIDE HCL 10 MG PO TABS: 10.0000 mg | ORAL_TABLET | Freq: Four times a day (QID) | ORAL | 0 refills | Status: DC | PRN

## 2022-02-19 MED ORDER — METOCLOPRAMIDE HCL 5 MG/ML IJ SOLN
10.0000 mg | Freq: Once | INTRAMUSCULAR | Status: AC
  Administered 2022-02-19: 10 mg via INTRAVENOUS
  Filled 2022-02-19: qty 2

## 2022-02-19 MED ORDER — ONDANSETRON HCL 4 MG/2ML IJ SOLN
4.0000 mg | Freq: Once | INTRAMUSCULAR | Status: AC
  Administered 2022-02-19: 4 mg via INTRAVENOUS
  Filled 2022-02-19: qty 2

## 2022-02-19 MED ORDER — PANTOPRAZOLE SODIUM 40 MG IV SOLR
40.0000 mg | Freq: Once | INTRAVENOUS | Status: AC
  Administered 2022-02-19: 40 mg via INTRAVENOUS
  Filled 2022-02-19: qty 10

## 2022-02-19 MED ORDER — IOHEXOL 350 MG/ML SOLN
75.0000 mL | Freq: Once | INTRAVENOUS | Status: AC | PRN
  Administered 2022-02-19: 75 mL via INTRAVENOUS

## 2022-02-19 MED ORDER — SODIUM CHLORIDE 0.9 % IV BOLUS
500.0000 mL | Freq: Once | INTRAVENOUS | Status: AC
  Administered 2022-02-19: 500 mL via INTRAVENOUS

## 2022-02-19 NOTE — ED Provider Notes (Signed)
[facility] EMERGENCY DEPARTMENT Provider Note   CSN: [id number] Arrival date & time: 02/19/22  0654     History  Chief Complaint  Patient presents with   Emesis    Hailey Greene is a 71 y.o. female.  Patient is a 71 year old female with a past medical history of diabetes, hypertension, hyperlipidemia status postcholecystectomy, appendectomy and prior hernia repair who presents with persistent nausea and vomiting.  She says she has had about a 6-day history of nausea and vomiting.  She does not report any events that really started this episode.  She initially was seen at urgent care and was prescribed Zofran and doxycycline.  Is unclear why she was prescribed the antibiotic.  She was seen at [facility] again 2 days ago on November 23 for ongoing nausea.  Her labs are nonconcerning.  She had no abdominal pain so imaging was not performed.  She says she is having persistent nausea and vomiting.  She cannot sleep at night because of the ongoing nausea.  She has had some intermittent cramping of her abdomen but denies any current pain.  Her emesis is nonbloody and nonbilious.  She reports normal bowel movements.  No urinary symptoms.  No fevers.  No headache or dizziness.  No instability on ambulation.       Home Medications Prior to Admission medications   Medication Sig Start Date End Date Taking? Authorizing Provider  metoCLOPramide (REGLAN) 10 MG tablet Take 1 tablet (10 mg total) by mouth every 6 (six) hours as needed for nausea. 02/19/22  Yes Malvin Johns, MD  ASPIRIN 81 PO Take 81 mg by mouth daily.    [provider]  blood glucose meter kit and supplies Dispense based on patient and insurance preference. Use up to four times daily as directed. (FOR ICD-10 E10.9, E11.9). 08/10/21   Rip Harbour, NP  Blood Glucose Monitoring Suppl (ONETOUCH VERIO) w/Device KIT Use daily to check FBS 06/24/20   Cox, Kirsten, MD  budesonide-formoterol [facility])  160-4.5 MCG/ACT inhaler Inhale 2 puffs into the lungs 2 (two) times daily. 02/01/22   CoxElnita Maxwell, MD  Cyanocobalamin 2500 MCG SUBL Place 1 tablet (2,500 mcg total) under the tongue daily. 02/01/22   Cox, Elnita Maxwell, MD  dicyclomine (BENTYL) 20 MG tablet Take 1 tablet (20 mg total) by mouth every 6 (six) hours. 11/03/21   Cox, Elnita Maxwell, MD  Eszopiclone 3 MG TABS Take 1 tablet (3 mg total) by mouth at bedtime. Take immediately before bedtime 02/05/22   Cox, Kirsten, MD  Eszopiclone 3 MG TABS TAKE ONE TABLET BY MOUTH EVERYDAY AT BEDTIME 02/05/22   Cox, Kirsten, MD  ezetimibe (ZETIA) 10 MG tablet TAKE 1 TABLET BY MOUTH EVERY DAY 01/25/22   Cox, Kirsten, MD  fluticasone [facility]) 50 MCG/ACT nasal spray SPRAY 2 SPRAYS INTO EACH NOSTRIL EVERY DAY 01/25/22   Cox, Kirsten, MD  glucose blood (ONETOUCH VERIO) test strip E11.42 Use new test strip each time when checking FBS 08/10/21   Rip Harbour, NP  icosapent Ethyl (VASCEPA) 1 g capsule Take 2 capsules (2 g total) by mouth 2 (two) times daily. 02/01/22   Cox, Elnita Maxwell, MD  Lancets [facility] DELICA PLUS [id number]) MISC E 11.42 Use new lancet each time when checking FBS 08/10/21   Rip Harbour, NP  loratadine (CLARITIN) 10 MG tablet Take 1 tablet (10 mg total) by mouth daily. 08/10/21   Rip Harbour, NP  Multiple Vitamin (MULTIVITAMIN WITH MINERALS) TABS tablet Take 1 tablet  by mouth daily.    [provider]  omeprazole (PRILOSEC) 20 MG capsule Take 1 capsule (20 mg total) by mouth daily. 02/01/22   Cox, Elnita Maxwell, MD  ondansetron (ZOFRAN) 4 MG tablet TAKE ONE TABLET BY MOUTH every SIX hours AS NEEDED FOR NAUSEA AND VOMITING 09/27/21   Cox, Kirsten, MD  phentermine 15 MG capsule Take 1 capsule (15 mg total) by mouth every morning. 10/29/21   Cox, Elnita Maxwell, MD  pregabalin (LYRICA) 300 MG capsule Take 1 capsule (300 mg total) by mouth 2 (two) times daily. 02/05/22   CoxElnita Maxwell, MD  promethazine (PHENERGAN) 25 MG tablet Take 1 tablet (25 mg total) by mouth  every 6 (six) hours as needed for nausea or vomiting. 12/02/21   Cox, Elnita Maxwell, MD  ramipril (ALTACE) 2.5 MG capsule TAKE ONE CAPSULE BY MOUTH ONCE DAILY 11/10/21   Cox, Kirsten, MD  rOPINIRole (REQUIP) 3 MG tablet TAKE 1 TABLET BY MOUTH ONCE DAILY 1-3 HOURS BEFORE  BEDTIME 11/03/21   Cox, Kirsten, MD  rosuvastatin (CRESTOR) 40 MG tablet Take 1 tablet (40 mg total) by mouth daily. 02/01/22   Cox, Kirsten, MD  Semaglutide (RYBELSUS) 7 MG TABS Take 7 mg by mouth daily. 11/03/21   Cox, Elnita Maxwell, MD  solifenacin (VESICARE) 5 MG tablet Take 1 tablet (5 mg total) by mouth daily. 11/03/21   Cox, Elnita Maxwell, MD  topiramate (TOPAMAX) 50 MG tablet Take 1 tablet (50 mg total) by mouth daily. 02/10/22   CoxElnita Maxwell, MD  triamcinolone cream (KENALOG) 0.1 % apply one application TOPICALLY twice daily 12/28/21   Marge Duncans, PA-C  vitamin B-12 1000 MCG tablet Take 1 tablet (1,000 mcg total) by mouth daily. 07/22/19   Raiford Noble Latif, DO  vortioxetine HBr (TRINTELLIX) 20 MG TABS tablet Take 1 tablet (20 mg total) by mouth daily. 11/03/21   Cox, Elnita Maxwell, MD  zolpidem (AMBIEN) 5 MG tablet Take 1 tablet (5 mg total) by mouth at bedtime as needed for sleep. 11/23/21   Rochel Brome, MD      Allergies    Patient has no known allergies.    Review of Systems   Review of Systems  Constitutional:  Negative for chills, diaphoresis, fatigue and fever.  HENT:  Negative for congestion, rhinorrhea and sneezing.   Eyes: Negative.   Respiratory:  Negative for cough, chest tightness and shortness of breath.   Cardiovascular:  Negative for chest pain and leg swelling.  Gastrointestinal:  Positive for abdominal pain, nausea and vomiting. Negative for blood in stool and diarrhea.  Genitourinary:  Negative for difficulty urinating, flank pain, frequency and hematuria.  Musculoskeletal:  Negative for arthralgias and back pain.  Skin:  Negative for rash.  Neurological:  Negative for dizziness, speech difficulty, weakness, numbness and  headaches.    Physical Exam Updated Vital Signs BP 120/86   Pulse 86   Temp 97.8 F (36.6 C)   Resp 19   SpO2 98%  Physical Exam Constitutional:      Appearance: She is well-developed.  HENT:     Head: Normocephalic and atraumatic.  Eyes:     Pupils: Pupils are equal, round, and reactive to light.  Cardiovascular:     Rate and Rhythm: Normal rate and regular rhythm.     Heart sounds: Normal heart sounds.  Pulmonary:     Effort: Pulmonary effort is normal. No respiratory distress.     Breath sounds: Normal breath sounds. No wheezing or rales.  Chest:     Chest wall: No  tenderness.  Abdominal:     General: Bowel sounds are normal.     Palpations: Abdomen is soft.     Tenderness: There is no abdominal tenderness. There is no guarding or rebound.  Musculoskeletal:        General: Normal range of motion.     Cervical back: Normal range of motion and neck supple.  Lymphadenopathy:     Cervical: No cervical adenopathy.  Skin:    General: Skin is warm and dry.     Findings: No rash.  Neurological:     Mental Status: She is alert and oriented to person, place, and time.     ED Results / Procedures / Treatments   Labs (all labs ordered are listed, but only abnormal results are displayed) Labs Reviewed  COMPREHENSIVE METABOLIC PANEL - Abnormal; Notable for the following components:      Result Value   Potassium 3.3 (*)    Glucose, Bld 115 (*)    Anion gap 16 (*)    All other components within normal limits  CBC - Abnormal; Notable for the following components:   WBC 11.0 (*)    RBC 5.12 (*)    All other components within normal limits  RESP PANEL BY RT-PCR (FLU A&B, COVID) [id number]  LIPASE, BLOOD  URINALYSIS, ROUTINE W REFLEX MICROSCOPIC    EKG EKG Interpretation  Date/Time:  Saturday February 19 2022 09:09:40 EST Ventricular Rate:  69 PR Interval:  142 QRS Duration: 94 QT Interval:  449 QTC Calculation: 481 R Axis:   20 Text Interpretation: Sinus rhythm  Borderline T abnormalities, anterior leads since last tracing no significant change Confirmed by Malvin Johns [phone]) on 02/19/2022 9:45:02 AM  Radiology CT Abdomen Pelvis W Contrast  Result Date: 02/19/2022 CLINICAL DATA:  Abdominal pain with nausea and vomiting. EXAM: CT ABDOMEN AND PELVIS WITH CONTRAST TECHNIQUE: Multidetector CT imaging of the abdomen and pelvis was performed using the standard protocol following bolus administration of intravenous contrast. RADIATION DOSE REDUCTION: This exam was performed according to the departmental dose-optimization program which includes automated exposure control, adjustment of the mA and/or kV according to patient size and/or use of iterative reconstruction technique. CONTRAST:  7m OMNIPAQUE IOHEXOL 350 MG/ML SOLN COMPARISON:  08/09/2019 FINDINGS: Lower chest: Unremarkable. Hepatobiliary: No suspicious focal abnormality within the liver parenchyma. Gallbladder surgically absent. No intrahepatic or extrahepatic biliary dilation. Pancreas: No focal mass lesion. No dilatation of the main duct. No intraparenchymal cyst. No peripancreatic edema. Spleen: Calcified granulomata. Adrenals/Urinary Tract: No adrenal nodule or mass. Small exophytic simple cyst interpolar right kidney. No followup imaging is recommended. Left kidney unremarkable. No evidence for hydroureter. The urinary bladder appears normal for the degree of distention. Stomach/Bowel: Stomach is unremarkable. No gastric wall thickening. No evidence of outlet obstruction. Duodenum is normally positioned as is the ligament of Treitz. Duodenal diverticulum noted. No small bowel wall thickening. No small bowel dilatation. The terminal ileum is normal. The appendix is not well visualized, but there is no edema or inflammation in the region of the cecum. No gross colonic mass. No colonic wall thickening. Diverticular changes are noted in the left colon without evidence of diverticulitis. Vascular/Lymphatic:  There is mild atherosclerotic calcification of the abdominal aorta without aneurysm. There is no gastrohepatic or hepatoduodenal ligament lymphadenopathy. No retroperitoneal or mesenteric lymphadenopathy. No pelvic sidewall lymphadenopathy. Reproductive: The uterus is surgically absent. There is no adnexal mass. Other: No intraperitoneal free fluid. Musculoskeletal: Adjacent supraumbilical ventral hernias contain only fat the more cranial of the  2 is right paramidline (28/3) and the more caudal of the 2 is closer to the midline on 31/3. These are proximally 9-10 cm cranial to the umbilicus. No worrisome lytic or sclerotic osseous abnormality. IMPRESSION: 1. No acute findings in the abdomen or pelvis. Specifically, no findings to explain the patient's history of abdominal pain with nausea and vomiting. 2. Left colonic diverticulosis without diverticulitis. 3. Adjacent supraumbilical ventral hernias contain only fat. 4.  Aortic Atherosclerosis (ICD10-I70.0). Electronically Signed   By: Misty Stanley M.D.   On: 02/19/2022 10:44    Procedures Procedures    Medications Ordered in ED Medications  sodium chloride 0.9 % bolus 500 mL (0 mLs Intravenous Stopped 02/19/22 1036)  ondansetron (ZOFRAN) injection 4 mg (4 mg Intravenous Given 02/19/22 0905)  pantoprazole (PROTONIX) injection 40 mg (40 mg Intravenous Given 02/19/22 0905)  iohexol (OMNIPAQUE) 350 MG/ML injection 75 mL (75 mLs Intravenous Contrast Given 02/19/22 1025)  metoCLOPramide (REGLAN) injection 10 mg (10 mg Intravenous Given 02/19/22 1152)    ED Course/ Medical Decision Making/ A&P                           Medical Decision Making Amount and/or Complexity of Data Reviewed Labs: ordered. Radiology: ordered.  Risk Prescription drug management.   Patient is a 71 year old female who presents with nausea and vomiting which has been persistent over the last 6 days.  She had a little bit of abdominal pain last night but no ongoing pain.  Her  labs are nonconcerning.  Her potassium is slightly low and she was given dose of oral potassium.  No evidence of pancreatitis.  No evidence of hepatitis.  She does not have any diarrhea which we more concerning for gastroenteritis.  She has been using Zofran at home without improvement in symptoms.  This is her third healthcare visit over the last week for similar symptoms.  Given this, CT scan of her abdomen pelvis was performed which showed no acute abnormalities.  CT the head was performed just to make sure she did not have any intracranial hemorrhage or other etiologies which would be causing ongoing nausea and vomiting.  This was also negative.  COVID/flu test are negative.  She was given Zofran without much improvement in symptoms.  She was given IV fluids and Protonix.  She was given Reglan which actually made her symptoms much better.  She was able to tolerate crackers and water without any ongoing nausea.  At this point she is feeling better and is ready to go home.  I do not see any indication for hospitalization.  Will go ahead and give her a prescription for Reglan since that seemed to work better than Zofran.  I encouraged her to take her omeprazole at home regularly.  She is going to call on Monday to follow-up with her primary care doctor.  Return precautions were given.  Final Clinical Impression(s) / ED Diagnoses Final diagnoses:  Nausea and vomiting, unspecified vomiting type    Rx / DC Orders ED Discharge Orders          Ordered    metoCLOPramide (REGLAN) 10 MG tablet  Every 6 hours PRN        02/19/22 1343              Malvin Johns, MD 02/19/22 1346

## 2022-02-19 NOTE — ED Notes (Signed)
Pt was PO challenged per EDP order & after the first saltine cracker & sipping on water she became nauseated & quite eating/drinking.

## 2022-02-19 NOTE — Discharge Instructions (Addendum)
Take your regularly.  Stay on a bland/clear liquid diet for the next few days.  I have given you a prescription for Reglan to try for the nausea.  Make sure you call Monday to follow-up with your primary care doctor.  Return to the emergency room if you have any worsening symptoms.

## 2022-02-19 NOTE — ED Notes (Signed)
Patient transported to CT 

## 2022-02-19 NOTE — ED Triage Notes (Signed)
Patient here with complaint of nausea and vomiting starting approximately one week ago. Seen at an urgent care in [place] and here for same previously, prescribed antibiotics and antiemetics with no improvement in symptoms. Patient is alert, oriented, and in no apparent distress at this time.

## 2022-02-21 ENCOUNTER — Telehealth: Payer: Self-pay

## 2022-02-21 NOTE — Telephone Encounter (Signed)
Appt made in am. Dr. Sedalia Muta

## 2022-02-21 NOTE — Progress Notes (Unsigned)
Subjective:  Patient ID: Hailey Greene, female    DOB: [birth year]  Age: 71 y.o. MRN: [id number]  Chief Complaint  Patient presents with   Hospital follow up    HPI  Follow up Hospitalization  Patient was seen at [facility] on 02/17/22 and 02/19/22, then discharged same day. She was treated for Nausea and Vomiting. Treatment for this included Zofran, and Reglan. CT of Abdomen/pelvis, and CT of the head were both done for imaging.   She reports {excellent/good/fair:19992} compliance with treatment. She reports this condition is {resolved/improved/worsened:23923}.     Current Outpatient Medications on File Prior to Visit  Medication Sig Dispense Refill   ASPIRIN 81 PO Take 81 mg by mouth daily.     blood glucose meter kit and supplies Dispense based on patient and insurance preference. Use up to four times daily as directed. (FOR ICD-10 E10.9, E11.9). 1 each 0   Blood Glucose Monitoring Suppl (ONETOUCH VERIO) w/Device KIT Use daily to check FBS 1 kit 0   budesonide-formoterol (SYMBICORT) 160-4.5 MCG/ACT inhaler Inhale 2 puffs into the lungs 2 (two) times daily. 1 each 3   Cyanocobalamin 2500 MCG SUBL Place 1 tablet (2,500 mcg total) under the tongue daily. 90 tablet 1   dicyclomine (BENTYL) 20 MG tablet Take 1 tablet (20 mg total) by mouth every 6 (six) hours. 360 tablet 1   Eszopiclone 3 MG TABS Take 1 tablet (3 mg total) by mouth at bedtime. Take immediately before bedtime 30 tablet 1   Eszopiclone 3 MG TABS TAKE ONE TABLET BY MOUTH EVERYDAY AT BEDTIME 30 tablet 0   ezetimibe (ZETIA) 10 MG tablet TAKE 1 TABLET BY MOUTH EVERY DAY 90 tablet 0   fluticasone (FLONASE) 50 MCG/ACT nasal spray SPRAY 2 SPRAYS INTO EACH NOSTRIL EVERY DAY 48 mL 2   glucose blood (ONETOUCH VERIO) test strip E11.42 Use new test strip each time when checking FBS 100 each 2   icosapent Ethyl (VASCEPA) 1 g capsule Take 2 capsules (2 g total) by mouth 2 (two) times daily. 360 capsule 0   Lancets (ONETOUCH  DELICA PLUS [id number]) MISC E 11.42 Use new lancet each time when checking FBS 100 each 2   loratadine (CLARITIN) 10 MG tablet Take 1 tablet (10 mg total) by mouth daily. 30 tablet 11   metoCLOPramide (REGLAN) 10 MG tablet Take 1 tablet (10 mg total) by mouth every 6 (six) hours as needed for nausea. 15 tablet 0   Multiple Vitamin (MULTIVITAMIN WITH MINERALS) TABS tablet Take 1 tablet by mouth daily.     omeprazole (PRILOSEC) 20 MG capsule Take 1 capsule (20 mg total) by mouth daily. 90 capsule 1   ondansetron (ZOFRAN) 4 MG tablet TAKE ONE TABLET BY MOUTH every SIX hours AS NEEDED FOR NAUSEA AND VOMITING 90 tablet 1   phentermine 15 MG capsule Take 1 capsule (15 mg total) by mouth every morning. 30 capsule 2   pregabalin (LYRICA) 300 MG capsule Take 1 capsule (300 mg total) by mouth 2 (two) times daily. 60 capsule 2   promethazine (PHENERGAN) 25 MG tablet Take 1 tablet (25 mg total) by mouth every 6 (six) hours as needed for nausea or vomiting. 30 tablet 0   ramipril (ALTACE) 2.5 MG capsule TAKE ONE CAPSULE BY MOUTH ONCE DAILY 90 capsule 1   rOPINIRole (REQUIP) 3 MG tablet TAKE 1 TABLET BY MOUTH ONCE DAILY 1-3 HOURS BEFORE  BEDTIME 90 tablet 1   rosuvastatin (CRESTOR) 40 MG tablet Take 1  tablet (40 mg total) by mouth daily. 90 tablet 0   Semaglutide (RYBELSUS) 7 MG TABS Take 7 mg by mouth daily. 90 tablet 1   solifenacin (VESICARE) 5 MG tablet Take 1 tablet (5 mg total) by mouth daily. 90 tablet 1   topiramate (TOPAMAX) 50 MG tablet Take 1 tablet (50 mg total) by mouth daily. 90 tablet 1   triamcinolone cream (KENALOG) 0.1 % apply one application TOPICALLY twice daily 80 g 3   vitamin B-12 1000 MCG tablet Take 1 tablet (1,000 mcg total) by mouth daily. 30 tablet 0   vortioxetine HBr (TRINTELLIX) 20 MG TABS tablet Take 1 tablet (20 mg total) by mouth daily. 90 tablet 1   zolpidem (AMBIEN) 5 MG tablet Take 1 tablet (5 mg total) by mouth at bedtime as needed for sleep. 30 tablet 2   No current  facility-administered medications on file prior to visit.   Past Medical History:  Diagnosis Date   2019 novel coronavirus disease (COVID-19) 12/31/2018   Acute hypoxemic respiratory failure due to COVID-19 (HCC)    Chicken pox    Depression    Diabetes (HCC)    GERD (gastroesophageal reflux disease)    Hypertension    Idiopathic progressive neuropathy    Major depressive disorder    Mixed hyperlipidemia    Neuropathy    Osteoarthritis    Pneumonia    Primary insomnia    Primary osteoarthritis of left knee 05/31/2019   RLS (restless legs syndrome)    S/P total knee replacement 07/15/2019   Status post total knee replacement 07/25/2019   Tachycardia    Urge incontinence    UTI (lower urinary tract infection)    Weakness    Past Surgical History:  Procedure Laterality Date   ABDOMINAL HYSTERECTOMY     APPENDECTOMY     CHOLECYSTECTOMY     HERNIA REPAIR     REPLACEMENT TOTAL KNEE Right    TONSILLECTOMY     TOTAL KNEE ARTHROPLASTY Left 07/15/2019   Procedure: TOTAL KNEE ARTHROPLASTY;  Surgeon: Vickey Huger, MD;  Location: [facility];  Service: Orthopedics;  Laterality: Left;    Family History  Problem Relation Age of Onset   Thyroid disease Mother    Cancer Mother    Migraines Mother    Brain cancer Father    Heart disease Maternal Grandmother    Diabetes Daughter    Social History   Socioeconomic History   Marital status: Married    Spouse name: Not on file   Number of children: 3   Years of education: 12   Highest education level: Not on file  Occupational History   Occupation: Housewife  Tobacco Use   Smoking status: Former    Packs/day: 2.00    Years: 12.00    Total pack years: 24.00    Types: Cigarettes    Quit date: 05/25/2010    Years since quitting: 11.7   Smokeless tobacco: Never  Vaping Use   Vaping Use: Never used  Substance and Sexual Activity   Alcohol use: Yes    Comment: occasional   Drug use: No   Sexual activity: Not on file  Other Topics  Concern   Not on file  Social History Narrative   Born and raised in [place], [place]. Currently lives in a house with her husband. 1 dog. Fun: Garden, feed birds, swimming   Denies any religious beliefs effecting health care.    Social Determinants of Health   Financial Resource Strain: High  Risk (02/12/2020)   Overall Financial Resource Strain (CARDIA)    Difficulty of Paying Living Expenses: Hard  Food Insecurity: No Food Insecurity (12/29/2021)   Hunger Vital Sign    Worried About Running Out of Food in the Last Year: Never true    Ran Out of Food in the Last Year: Never true  Transportation Needs: No Transportation Needs (12/29/2021)   PRAPARE - Hydrologist (Medical): No    Lack of Transportation (Non-Medical): No  Physical Activity: Insufficiently Active (02/12/2020)   Exercise Vital Sign    Days of Exercise per Week: 7 days    Minutes of Exercise per Session: 20 min  Stress: Not on file  Social Connections: Not on file    Review of Systems  Constitutional:  Negative for appetite change, fatigue and fever.  HENT:  Negative for congestion, ear pain, sinus pressure and sore throat.   Respiratory:  Negative for cough, chest tightness, shortness of breath and wheezing.   Cardiovascular:  Negative for chest pain and palpitations.  Gastrointestinal:  Negative for abdominal pain, constipation, diarrhea, nausea and vomiting.  Genitourinary:  Negative for dysuria and hematuria.  Musculoskeletal:  Negative for arthralgias, back pain, joint swelling and myalgias.  Skin:  Negative for rash.  Neurological:  Negative for dizziness, weakness and headaches.  Psychiatric/Behavioral:  Negative for dysphoric mood. The patient is not nervous/anxious.      Objective:  There were no vitals taken for this visit.     02/19/2022    2:09 PM 02/19/2022    1:10 PM 02/19/2022   12:00 PM  BP/Weight  Systolic BP [phone]  Diastolic BP 71 86 74    Physical  Exam Vitals reviewed.  Constitutional:      Appearance: Normal appearance. She is normal weight.  Cardiovascular:     Rate and Rhythm: Normal rate and regular rhythm.     Heart sounds: Normal heart sounds.  Pulmonary:     Effort: Pulmonary effort is normal.     Breath sounds: Normal breath sounds.  Abdominal:     General: Abdomen is flat. Bowel sounds are normal.     Palpations: Abdomen is soft.  Neurological:     Mental Status: She is alert and oriented to person, place, and time.  Psychiatric:        Mood and Affect: Mood normal.        Behavior: Behavior normal.     Diabetic Foot Exam - Simple   No data filed      Lab Results  Component Value Date   WBC 11.0 (H) 02/19/2022   HGB 14.9 02/19/2022   HCT 44.7 02/19/2022   PLT 249 02/19/2022   GLUCOSE 115 (H) 02/19/2022   CHOL 208 (H) 11/23/2021   TRIG 183 (H) 11/23/2021   HDL 48 11/23/2021   LDLCALC 128 (H) 11/23/2021   ALT 10 02/19/2022   AST 16 02/19/2022   NA 136 02/19/2022   K 3.3 (L) 02/19/2022   CL 98 02/19/2022   CREATININE 0.93 02/19/2022   BUN 23 02/19/2022   CO2 22 02/19/2022   TSH 0.552 11/23/2021   HGBA1C 5.7 (H) 11/23/2021   MICROALBUR 30 09/25/2020      Assessment & Plan:   Problem List Items Addressed This Visit   None Visit Diagnoses     Nausea and vomiting, unspecified vomiting type    -  Primary     .  No orders of the defined types  were placed in this encounter.   No orders of the defined types were placed in this encounter.    Follow-up: No follow-ups on file.  An After Visit Summary was printed and given to the patient.  Rochel Brome, MD [facility] [phone]

## 2022-02-21 NOTE — Telephone Encounter (Signed)
Hailey Greene called to report that she has ongoing nausea.  She has not vomited to day but she has only had sips of water.  She has been taking her zofran 4 mg as needed and the reglan as prescribed with no improvement.  She is scheduled for follow-up with Dr. Sedalia Muta next week.  Will review with Dr. Sedalia Muta and call her back.

## 2022-02-22 ENCOUNTER — Other Ambulatory Visit: Payer: Self-pay | Admitting: Nurse Practitioner

## 2022-02-22 ENCOUNTER — Encounter: Payer: Medicare Other | Admitting: Family Medicine

## 2022-02-22 DIAGNOSIS — R112 Nausea with vomiting, unspecified: Secondary | ICD-10-CM

## 2022-02-22 DIAGNOSIS — R110 Nausea: Secondary | ICD-10-CM

## 2022-02-27 NOTE — Assessment & Plan Note (Signed)
The current medical regimen is effective;  continue present plan and medications.  

## 2022-02-27 NOTE — Progress Notes (Unsigned)
Subjective:  Patient ID: Hailey Greene, female    DOB: Dec 08, 1950  Age: 71 y.o. MRN: [id number]  Chief Complaint  Patient presents with   Hyperlipidemia   Hypertension    HPI Diabetes:  Complications: hypertension, neuropathy. Glucose checking: Daily Glucose logs:110-122 Hypoglycemia: No Most recent A1C: 5.8% Current medications: Rybelsus 7 mg daily. Last Eye Exam: 08/05/2021 Foot checks:Daily  Asthma: controlled with symbicort 2 puffs twice daily,  Hyperlipidemia: Current medications: Vascepa 2g twice a day, Rosuvastatin 40 mg daily.  Hypertension: Current medications: Ramipril 2.5 mg daily.  Depression: Taking Trintellix 20 mg daily.    03/02/2022    3:45 PM 12/29/2021    1:16 PM 11/23/2021    9:30 AM  PHQ9 SCORE ONLY  PHQ-9 Total Score 1 0 0   GERD: Takes Omeprazole 20 mg daily IBS: on bentyl: takes twice daily usually  Rest legs syndrome: Ropinirole 3 mg at bedtime.  Incontinence urinary: Taking Vesicare 5 mg daily.  Primary Insomnia: Patient is currently taking lunesta 3 mg at bedtime. Gets 4 hours of sleep.   Neuropathy: Lyrica 300 mg two times a day. Preceded diagnosis of diabetes.  Diet: healthy. Phentermine 15 mg once daily. On topamax 50 mg daily.  Exercise: walking.    Patient was nauseated and vomited beginning mid November 2023. Seen at urgent care, [place] ED x 2. Seemed to improve. Last vomited 30 minutes prior to coming here. Denies reflux. Poor appetite.    Current Outpatient Medications on File Prior to Visit  Medication Sig Dispense Refill   ASPIRIN 81 PO Take 81 mg by mouth daily.     blood glucose meter kit and supplies Dispense based on patient and insurance preference. Use up to four times daily as directed. (FOR ICD-10 E10.9, E11.9). 1 each 0   Blood Glucose Monitoring Suppl (ONETOUCH VERIO) w/Device KIT Use daily to check FBS 1 kit 0   budesonide-formoterol (SYMBICORT) 160-4.5 MCG/ACT inhaler Inhale 2 puffs into the lungs 2 (two)  times daily. 1 each 3   Cyanocobalamin 2500 MCG SUBL Place 1 tablet (2,500 mcg total) under the tongue daily. 90 tablet 1   dicyclomine (BENTYL) 20 MG tablet Take 1 tablet (20 mg total) by mouth every 6 (six) hours. 360 tablet 1   ezetimibe (ZETIA) 10 MG tablet TAKE 1 TABLET BY MOUTH EVERY DAY 90 tablet 0   fluticasone (FLONASE) 50 MCG/ACT nasal spray SPRAY 2 SPRAYS INTO EACH NOSTRIL EVERY DAY 48 mL 2   glucose blood (ONETOUCH VERIO) test strip E11.42 Use new test strip each time when checking FBS 100 each 2   icosapent Ethyl (VASCEPA) 1 g capsule Take 2 capsules (2 g total) by mouth 2 (two) times daily. 360 capsule 0   Lancets (ONETOUCH DELICA PLUS [id number]) MISC E 11.42 Use new lancet each time when checking FBS 100 each 2   loratadine (CLARITIN) 10 MG tablet Take 1 tablet (10 mg total) by mouth daily. 30 tablet 11   metoCLOPramide (REGLAN) 10 MG tablet Take 1 tablet (10 mg total) by mouth every 6 (six) hours as needed for nausea. 15 tablet 0   Multiple Vitamin (MULTIVITAMIN WITH MINERALS) TABS tablet Take 1 tablet by mouth daily.     omeprazole (PRILOSEC) 20 MG capsule Take 1 capsule (20 mg total) by mouth daily. 90 capsule 1   ondansetron (ZOFRAN) 4 MG tablet TAKE ONE TABLET BY MOUTH every SIX hours AS NEEDED FOR NAUSEA AND VOMITING 90 tablet 1   phentermine 15 MG capsule  Take 1 capsule (15 mg total) by mouth every morning. 30 capsule 2   pregabalin (LYRICA) 300 MG capsule Take 1 capsule (300 mg total) by mouth 2 (two) times daily. 60 capsule 2   promethazine (PHENERGAN) 25 MG tablet Take 1 tablet (25 mg total) by mouth every 6 (six) hours as needed for nausea or vomiting. 30 tablet 0   ramipril (ALTACE) 2.5 MG capsule TAKE ONE CAPSULE BY MOUTH ONCE DAILY 90 capsule 1   rOPINIRole (REQUIP) 3 MG tablet TAKE 1 TABLET BY MOUTH ONCE DAILY 1-3 HOURS BEFORE  BEDTIME 90 tablet 1   rosuvastatin (CRESTOR) 40 MG tablet Take 1 tablet (40 mg total) by mouth daily. 90 tablet 0   solifenacin (VESICARE) 5  MG tablet Take 1 tablet (5 mg total) by mouth daily. 90 tablet 1   topiramate (TOPAMAX) 50 MG tablet Take 1 tablet (50 mg total) by mouth daily. 90 tablet 1   triamcinolone cream (KENALOG) 0.1 % apply one application TOPICALLY twice daily 80 g 3   vitamin B-12 1000 MCG tablet Take 1 tablet (1,000 mcg total) by mouth daily. 30 tablet 0   vortioxetine HBr (TRINTELLIX) 20 MG TABS tablet Take 1 tablet (20 mg total) by mouth daily. 90 tablet 1   No current facility-administered medications on file prior to visit.   Past Medical History:  Diagnosis Date   2019 novel coronavirus disease (COVID-19) 12/31/2018   Acute hypoxemic respiratory failure due to COVID-19 (HCC)    Chicken pox    Depression    Diabetes (HCC)    GERD (gastroesophageal reflux disease)    Hypertension    Idiopathic progressive neuropathy    Major depressive disorder    Mixed hyperlipidemia    Neuropathy    Osteoarthritis    Pneumonia    Primary insomnia    Primary osteoarthritis of left knee 05/31/2019   RLS (restless legs syndrome)    S/P total knee replacement 07/15/2019   Status post total knee replacement 07/25/2019   Tachycardia    Urge incontinence    UTI (lower urinary tract infection)    Weakness    Past Surgical History:  Procedure Laterality Date   ABDOMINAL HYSTERECTOMY     APPENDECTOMY     CHOLECYSTECTOMY     HERNIA REPAIR     REPLACEMENT TOTAL KNEE Right    TONSILLECTOMY     TOTAL KNEE ARTHROPLASTY Left 07/15/2019   Procedure: TOTAL KNEE ARTHROPLASTY;  Surgeon: Vickey Huger, MD;  Location: [facility];  Service: Orthopedics;  Laterality: Left;    Family History  Problem Relation Age of Onset   Thyroid disease Mother    Cancer Mother    Migraines Mother    Brain cancer Father    Heart disease Maternal Grandmother    Diabetes Daughter    Social History   Socioeconomic History   Marital status: Married    Spouse name: Not on file   Number of children: 3   Years of education: 12   Highest  education level: Not on file  Occupational History   Occupation: Housewife  Tobacco Use   Smoking status: Former    Packs/day: 2.00    Years: 12.00    Total pack years: 24.00    Types: Cigarettes    Quit date: 05/25/2010    Years since quitting: 11.7   Smokeless tobacco: Never  Vaping Use   Vaping Use: Never used  Substance and Sexual Activity   Alcohol use: Yes    Comment:  occasional   Drug use: No   Sexual activity: Not on file  Other Topics Concern   Not on file  Social History Narrative   Born and raised in [place], [place]. Currently lives in a house with her husband. 1 dog. Fun: Garden, feed birds, swimming   Denies any religious beliefs effecting health care.    Social Determinants of Health   Financial Resource Strain: High Risk (02/12/2020)   Overall Financial Resource Strain (CARDIA)    Difficulty of Paying Living Expenses: Hard  Food Insecurity: No Food Insecurity (12/29/2021)   Hunger Vital Sign    Worried About Running Out of Food in the Last Year: Never true    Ran Out of Food in the Last Year: Never true  Transportation Needs: No Transportation Needs (12/29/2021)   PRAPARE - Hydrologist (Medical): No    Lack of Transportation (Non-Medical): No  Physical Activity: Insufficiently Active (02/12/2020)   Exercise Vital Sign    Days of Exercise per Week: 7 days    Minutes of Exercise per Session: 20 min  Stress: Not on file  Social Connections: Not on file    Review of Systems  Constitutional:  Negative for chills and fatigue.  HENT:  Positive for ear pain (left) and sinus pain. Negative for congestion and sore throat.   Respiratory:  Negative for cough and shortness of breath.   Cardiovascular:  Negative for chest pain and leg swelling.  Gastrointestinal:  Positive for nausea and vomiting. Negative for abdominal pain, constipation and diarrhea.  Endocrine: Negative for polydipsia, polyphagia and polyuria.  Genitourinary:  Negative  for dysuria and frequency.  Musculoskeletal:  Negative for arthralgias, back pain and myalgias.  Neurological:  Negative for dizziness and headaches.  Psychiatric/Behavioral:  Negative for dysphoric mood. The patient is not nervous/anxious.      Objective:  BP 128/70   Pulse 90   Temp (!) 97.3 F (36.3 C)   Resp 18   Ht _0  (1.6 m)   Wt 188 lb (85.3 kg)   SpO2 97%   BMI 33.30 kg/m      02/28/2022    8:53 AM 02/19/2022    2:09 PM 02/19/2022    1:10 PM  BP/Weight  Systolic BP 850 277 412  Diastolic BP 70 71 86  Wt. (Lbs) 188    BMI 33.3 kg/m2      Physical Exam Vitals reviewed.  Constitutional:      Appearance: Normal appearance. She is normal weight.  HENT:     Right Ear: Tympanic membrane normal.     Ears:     Comments: Left tm erythema mild    Nose: No congestion or rhinorrhea.     Mouth/Throat:     Pharynx: No oropharyngeal exudate or posterior oropharyngeal erythema.  Neck:     Vascular: No carotid bruit.  Cardiovascular:     Rate and Rhythm: Normal rate and regular rhythm.     Heart sounds: Normal heart sounds.  Pulmonary:     Effort: Pulmonary effort is normal. No respiratory distress.     Breath sounds: Normal breath sounds.  Abdominal:     General: Abdomen is flat. Bowel sounds are normal.     Palpations: Abdomen is soft.     Tenderness: There is no abdominal tenderness.  Neurological:     Mental Status: She is alert and oriented to person, place, and time.  Psychiatric:        Mood and Affect:  Mood normal.        Behavior: Behavior normal.     Diabetic Foot Exam - Simple   Simple Foot Form  02/28/2022  3:44 PM  Visual Inspection No deformities, no ulcerations, no other skin breakdown bilaterally: Yes Sensation Testing See comments: Yes Pulse Check Posterior Tibialis and Dorsalis pulse intact bilaterally: Yes Comments Decreased sensation BL feet.       Lab Results  Component Value Date   WBC 8.2 02/28/2022   HGB 13.9 02/28/2022    HCT 43.6 02/28/2022   PLT 288 02/28/2022   GLUCOSE 107 (H) 02/28/2022   CHOL 189 02/28/2022   TRIG 125 02/28/2022   HDL 40 02/28/2022   LDLCALC 126 (H) 02/28/2022   ALT 10 02/28/2022   AST 17 02/28/2022   NA 136 02/28/2022   K 4.6 02/28/2022   CL 98 02/28/2022   CREATININE 0.70 02/28/2022   BUN 9 02/28/2022   CO2 24 02/28/2022   TSH 0.552 11/23/2021   HGBA1C 5.8 (H) 02/28/2022   MICROALBUR 30 09/25/2020      Assessment & Plan:   Problem List Items Addressed This Visit       Cardiovascular and Mediastinum   Hypertension associated with diabetes ([place]) - Primary    Well controlled.  No changes to medicines.  Continue to work on eating a healthy diet and exercise.  Labs drawn today.        Relevant Orders   CBC with Differential/Platelet (Completed)   Comprehensive metabolic panel (Completed)   Cardiovascular Risk Assessment (Completed)     Digestive   Nausea and vomiting in adult    Recommended if needs ED go to [facility] (one in [place] an one in [place].   HOLD rybelsus to see if contributing to nausea and vomiting.         Endocrine   Diabetic polyneuropathy associated with type 2 diabetes mellitus ([place])    Control: excellent Recommend check sugars fasting daily. Recommend check feet daily. Recommend annual eye exams. Medicines:  hold rybelsus in case causing nausea and vomiting.  Continue to work on eating a healthy diet and exercise.  Labs drawn today.         Relevant Orders   Lipid panel (Completed)   Hemoglobin A1c (Completed)     Nervous and Auditory   Idiopathic progressive neuropathy    The current medical regimen is effective;  continue present plan and medications.       Otitis media    Amoxicillin given.       Relevant Medications   amoxicillin (AMOXIL) 875 MG tablet     Other   Mild recurrent major depression ([place])    The current medical regimen is effective;  continue present plan and medications.       Mixed  hyperlipidemia    Not quite at goal No changes to medicines.  Continue to work on eating a healthy diet and exercise.  Labs drawn today.        Urge incontinence    The current medical regimen is effective;  continue present plan and medications. Continue vesicare 5 mg daily.       RESOLVED: Primary insomnia (Chronic)    The current medical regimen is effective;  continue present plan and medications.       Relevant Orders   Ambulatory referral to Pulmonology  .  Meds ordered this encounter  Medications   amoxicillin (AMOXIL) 875 MG tablet    Sig: Take 1 tablet (  875 mg total) by mouth 2 (two) times daily for 10 days.    Dispense:  20 tablet    Refill:  0    Orders Placed This Encounter  Procedures   CBC with Differential/Platelet   Comprehensive metabolic panel   Lipid panel   Hemoglobin A1c   Cardiovascular Risk Assessment   Ambulatory referral to Pulmonology     Follow-up: Return in about 3 months (around 05/30/2022) for chronic fasting.  An After Visit Summary was printed and given to the patient.  Rochel Brome, MD [facility] [phone]

## 2022-02-27 NOTE — Assessment & Plan Note (Signed)
Well controlled.  ?No changes to medicines.  ?Continue to work on eating a healthy diet and exercise.  ?Labs drawn today.  ?

## 2022-02-27 NOTE — Assessment & Plan Note (Signed)
Control: excellent Recommend check sugars fasting daily. Recommend check feet daily. Recommend annual eye exams. Medicines:  hold rybelsus in case causing nausea and vomiting.  Continue to work on eating a healthy diet and exercise.  Labs drawn today.

## 2022-02-27 NOTE — Progress Notes (Signed)
This encounter was created in error - please disregard.

## 2022-02-27 NOTE — Assessment & Plan Note (Signed)
The current medical regimen is effective;  continue present plan and medications. Continue vesicare 5 mg daily.  

## 2022-02-28 ENCOUNTER — Ambulatory Visit: Payer: Self-pay

## 2022-02-28 ENCOUNTER — Ambulatory Visit: Payer: Medicare Other | Admitting: Family Medicine

## 2022-02-28 ENCOUNTER — Ambulatory Visit (INDEPENDENT_AMBULATORY_CARE_PROVIDER_SITE_OTHER): Payer: Medicare Other | Admitting: Family Medicine

## 2022-02-28 VITALS — BP 128/70 | HR 90 | Temp 97.3°F | Resp 18 | Ht 63.0 in | Wt 188.0 lb

## 2022-02-28 DIAGNOSIS — E1159 Type 2 diabetes mellitus with other circulatory complications: Secondary | ICD-10-CM | POA: Diagnosis not present

## 2022-02-28 DIAGNOSIS — F5101 Primary insomnia: Secondary | ICD-10-CM

## 2022-02-28 DIAGNOSIS — I152 Hypertension secondary to endocrine disorders: Secondary | ICD-10-CM | POA: Diagnosis not present

## 2022-02-28 DIAGNOSIS — R112 Nausea with vomiting, unspecified: Secondary | ICD-10-CM | POA: Diagnosis not present

## 2022-02-28 DIAGNOSIS — H6690 Otitis media, unspecified, unspecified ear: Secondary | ICD-10-CM | POA: Insufficient documentation

## 2022-02-28 DIAGNOSIS — G603 Idiopathic progressive neuropathy: Secondary | ICD-10-CM

## 2022-02-28 DIAGNOSIS — E1142 Type 2 diabetes mellitus with diabetic polyneuropathy: Secondary | ICD-10-CM | POA: Diagnosis not present

## 2022-02-28 DIAGNOSIS — F330 Major depressive disorder, recurrent, mild: Secondary | ICD-10-CM

## 2022-02-28 DIAGNOSIS — E782 Mixed hyperlipidemia: Secondary | ICD-10-CM

## 2022-02-28 DIAGNOSIS — H65192 Other acute nonsuppurative otitis media, left ear: Secondary | ICD-10-CM

## 2022-02-28 DIAGNOSIS — N3941 Urge incontinence: Secondary | ICD-10-CM

## 2022-02-28 MED ORDER — AMOXICILLIN 875 MG PO TABS: 875.0000 mg | ORAL_TABLET | Freq: Two times a day (BID) | ORAL | 0 refills | Status: AC

## 2022-02-28 NOTE — Patient Outreach (Signed)
  Care Coordination   Follow Up Visit Note   02/28/2022 Name: Hailey Greene MRN: [id number] DOB: April 30, 1950  Hailey Greene is a 71 y.o. year old female who sees Cox, Kirsten, MD for primary care. I spoke with  Clarisa Fling by phone today.  What matters to the patients health and wellness today?  Patient reports that she is doing well. Reports she saw PCP today for follow up. States that she continues to have pain in her feet and legs. States lyrica is not helping. Reports that she would like a referral to podiatry.  Patient reports that she did get her home exercise plan and she has been doing the exercises 3 times per week. States that she has her electric scooter and will hope to get it put together this week.  States pain 8/10 . Patient reports that she has been having nausea for a while for unknown reason.  Reports MD got her a prescription today for meds.     Goals Addressed               This Visit's Progress     Impaired mobility (pt-stated)        Care Coordination Interventions: Encouraged patient to use scooter  Request for a podiatry referral send to MD. Reviewed importance of home exercises Assessed pain scale. Reviewed follow up appointments.          SDOH assessments and interventions completed:  No     Care Coordination Interventions:  Yes, provided   Follow up plan: Follow up call scheduled for 03/31/2022    Encounter Outcome:  Pt. Visit Completed   Rowe Pavy, RN, BSN, CEN [facility] Coordinator [phone]

## 2022-02-28 NOTE — Patient Instructions (Addendum)
Recommended if needs ED go to [facility] (one in [place] an one in [place].   HOLD rybelsus to see if contributing to nausea and vomitin.

## 2022-03-01 ENCOUNTER — Encounter: Payer: Self-pay | Admitting: Family Medicine

## 2022-03-01 LAB — HEMOGLOBIN A1C
Est. average glucose Bld gHb Est-mCnc: 120 mg/dL
Hgb A1c MFr Bld: 5.8 % — ABNORMAL HIGH (ref 4.8–5.6)

## 2022-03-01 LAB — COMPREHENSIVE METABOLIC PANEL
ALT: 10 IU/L (ref 0–32)
AST: 17 IU/L (ref 0–40)
Albumin/Globulin Ratio: 1.8 (ref 1.2–2.2)
Albumin: 3.7 g/dL — ABNORMAL LOW (ref 3.9–4.9)
Alkaline Phosphatase: 98 IU/L (ref 44–121)
BUN/Creatinine Ratio: 13 (ref 12–28)
BUN: 9 mg/dL (ref 8–27)
Bilirubin Total: <0.2 mg/dL (ref 0.0–1.2)
CO2: 24 mmol/L (ref 20–29)
Calcium: 9 mg/dL (ref 8.7–10.3)
Chloride: 98 mmol/L (ref 96–106)
Creatinine, Ser: 0.7 mg/dL (ref 0.57–1.00)
Globulin, Total: 2.1 g/dL (ref 1.5–4.5)
Glucose: 107 mg/dL — ABNORMAL HIGH (ref 70–99)
Potassium: 4.6 mmol/L (ref 3.5–5.2)
Sodium: 136 mmol/L (ref 134–144)
Total Protein: 5.8 g/dL — ABNORMAL LOW (ref 6.0–8.5)
eGFR: 93 mL/min/{1.73_m2} (ref >59)

## 2022-03-01 LAB — LIPID PANEL
Chol/HDL Ratio: 4.7 ratio — ABNORMAL HIGH (ref 0.0–4.4)
Cholesterol, Total: 189 mg/dL (ref 100–199)
HDL: 40 mg/dL (ref >39)
LDL Chol Calc (NIH): 126 mg/dL — ABNORMAL HIGH (ref 0–99)
Triglycerides: 125 mg/dL (ref 0–149)
VLDL Cholesterol Cal: 23 mg/dL (ref 5–40)

## 2022-03-01 LAB — CBC WITH DIFFERENTIAL/PLATELET
Basophils Absolute: 0 10*3/uL (ref 0.0–0.2)
Basos: 1 %
EOS (ABSOLUTE): 0.1 10*3/uL (ref 0.0–0.4)
Eos: 1 %
Hematocrit: 43.6 % (ref 34.0–46.6)
Hemoglobin: 13.9 g/dL (ref 11.1–15.9)
Immature Grans (Abs): 0.1 10*3/uL (ref 0.0–0.1)
Immature Granulocytes: 1 %
Lymphocytes Absolute: 1.9 10*3/uL (ref 0.7–3.1)
Lymphs: 23 %
MCH: 28.3 pg (ref 26.6–33.0)
MCHC: 31.9 g/dL (ref 31.5–35.7)
MCV: 89 fL (ref 79–97)
Monocytes Absolute: 0.7 10*3/uL (ref 0.1–0.9)
Monocytes: 8 %
Neutrophils Absolute: 5.4 10*3/uL (ref 1.4–7.0)
Neutrophils: 66 %
Platelets: 288 10*3/uL (ref 150–450)
RBC: 4.91 x10E6/uL (ref 3.77–5.28)
RDW: 12.8 % (ref 11.7–15.4)
WBC: 8.2 10*3/uL (ref 3.4–10.8)

## 2022-03-01 LAB — CARDIOVASCULAR RISK ASSESSMENT

## 2022-03-01 MED ORDER — RYBELSUS 14 MG PO TABS: 14.0000 mg | ORAL_TABLET | Freq: Every day | ORAL | 0 refills | Status: DC

## 2022-03-02 ENCOUNTER — Encounter: Payer: Self-pay | Admitting: Family Medicine

## 2022-03-02 NOTE — Assessment & Plan Note (Signed)
Recommended if needs ED go to [facility] (one in [place] an one in [place].   HOLD rybelsus to see if contributing to nausea and vomiting.

## 2022-03-02 NOTE — Progress Notes (Signed)
Blood count normal.  Liver function normal.  Kidney function normal.  Cholesterol: LDL high at 126. Trigs improved to normal range. Continue current medications.  Recommend continue to work on eating healthy diet and exercise. HBA1C: 5.8.

## 2022-03-02 NOTE — Assessment & Plan Note (Signed)
Amoxicillin given 

## 2022-03-07 ENCOUNTER — Other Ambulatory Visit: Payer: Self-pay | Admitting: Family Medicine

## 2022-03-15 ENCOUNTER — Ambulatory Visit: Payer: Medicare Other | Admitting: Family Medicine

## 2022-03-19 DIAGNOSIS — G4733 Obstructive sleep apnea (adult) (pediatric): Secondary | ICD-10-CM | POA: Diagnosis not present

## 2022-03-29 ENCOUNTER — Other Ambulatory Visit: Payer: Self-pay | Admitting: Family Medicine

## 2022-03-29 ENCOUNTER — Other Ambulatory Visit: Payer: Self-pay

## 2022-03-29 MED ORDER — EZETIMIBE 10 MG PO TABS: 10.0000 mg | ORAL_TABLET | Freq: Every day | ORAL | 0 refills | Status: DC

## 2022-03-31 ENCOUNTER — Ambulatory Visit: Payer: Self-pay

## 2022-04-04 NOTE — Patient Outreach (Signed)
  Care Coordination   04/04/2022 Name: Hailey Greene MRN: [id number] DOB: [birth year]   Care Coordination Outreach Attempts:  An unsuccessful telephone outreach was attempted for a scheduled appointment today.  Follow Up Plan:  Additional outreach attempts will be made to offer the patient care coordination information and services.   Encounter Outcome:  No Answer   Care Coordination Interventions:  No, not indicated    Tomasa Rand, RN, BSN, [facility] [employer] [phone]

## 2022-04-07 ENCOUNTER — Telehealth: Payer: Self-pay | Admitting: *Deleted

## 2022-04-07 ENCOUNTER — Other Ambulatory Visit: Payer: Self-pay | Admitting: Family Medicine

## 2022-04-07 DIAGNOSIS — G603 Idiopathic progressive neuropathy: Secondary | ICD-10-CM

## 2022-04-07 DIAGNOSIS — E1142 Type 2 diabetes mellitus with diabetic polyneuropathy: Secondary | ICD-10-CM

## 2022-04-07 NOTE — Progress Notes (Signed)
  Care Coordination Note  04/07/2022 Name: Hailey Greene MRN: [id number] DOB: 11/14/1950  Hailey Greene is a 72 y.o. year old female who is a primary care patient of Cox, Kirsten, MD and is actively engaged with the care management team. I reached out to [name] by phone today to assist with re-scheduling a follow up visit with the RN Case Manager  Follow up plan: Unsuccessful telephone outreach attempt made. A HIPAA compliant phone message was left for the patient providing contact information and requesting a return call.   Julian Hy, [place] Direct Dial: [phone]

## 2022-04-19 DIAGNOSIS — G4733 Obstructive sleep apnea (adult) (pediatric): Secondary | ICD-10-CM | POA: Diagnosis not present

## 2022-04-24 ENCOUNTER — Other Ambulatory Visit: Payer: Self-pay | Admitting: Nurse Practitioner

## 2022-04-24 DIAGNOSIS — R110 Nausea: Secondary | ICD-10-CM

## 2022-04-25 ENCOUNTER — Other Ambulatory Visit: Payer: Self-pay | Admitting: Nurse Practitioner

## 2022-04-25 ENCOUNTER — Other Ambulatory Visit: Payer: Self-pay | Admitting: Family Medicine

## 2022-04-25 DIAGNOSIS — F5101 Primary insomnia: Secondary | ICD-10-CM

## 2022-04-28 DIAGNOSIS — G4733 Obstructive sleep apnea (adult) (pediatric): Secondary | ICD-10-CM | POA: Diagnosis not present

## 2022-05-02 ENCOUNTER — Telehealth: Payer: Self-pay

## 2022-05-02 ENCOUNTER — Other Ambulatory Visit: Payer: Self-pay

## 2022-05-02 MED ORDER — ROSUVASTATIN CALCIUM 40 MG PO TABS: 40.0000 mg | ORAL_TABLET | Freq: Every day | ORAL | 0 refills | Status: DC

## 2022-05-02 MED ORDER — VORTIOXETINE HBR 20 MG PO TABS: 20.0000 mg | ORAL_TABLET | Freq: Every day | ORAL | 1 refills | Status: DC

## 2022-05-02 MED ORDER — SOLIFENACIN SUCCINATE 5 MG PO TABS: 5.0000 mg | ORAL_TABLET | Freq: Every day | ORAL | 1 refills | Status: DC

## 2022-05-02 MED ORDER — RAMIPRIL 2.5 MG PO CAPS: 2.5000 mg | ORAL_CAPSULE | Freq: Every day | ORAL | 1 refills | Status: DC

## 2022-05-02 MED ORDER — ICOSAPENT ETHYL 1 G PO CAPS: 2.0000 g | ORAL_CAPSULE | Freq: Two times a day (BID) | ORAL | 0 refills | Status: DC

## 2022-05-02 MED ORDER — ROPINIROLE HCL 3 MG PO TABS: ORAL_TABLET | ORAL | 1 refills | Status: DC

## 2022-05-02 MED ORDER — RYBELSUS 14 MG PO TABS: 14.0000 mg | ORAL_TABLET | Freq: Every day | ORAL | 0 refills | Status: DC

## 2022-05-02 NOTE — Progress Notes (Unsigned)
Care Management & Coordination Services Pharmacy Team  Reason for Encounter: Medication coordination and delivery  Contacted patient to discuss medications and coordinate delivery from [facility]. {US HC Outreach:28874} Cycle dispensing form sent to *** for review.   Last adherence delivery date: 02-11-2022  Patient is due for next adherence delivery on: 05-12-2022  This delivery to include: Vials  90 Days  Rosuvastatin 40mg -1 daily  Omeprazole 20mg -1 daily  Dicyclomine 20mg - 1 tab every 6 hours          Rybelsus 7mg - 1 tab once daily Budes/formot inhaler 160/4.5-2 puffs twice daily        Loratadine 10mg -1 tab once daily       Solifenacin 5mg -1 tab once daily       Topiramate 50mg - 1 daily Vitamin B12 2563mcg-1 tab under tongue daily  Trintellix 20mg -1 tab once daily     Ramipril 2.5mg -1 capsule once daily   Ropinirole 3mg -  1 tab 1-3 hours before bedtime Lyrica 100 mg- 1 capsule twice daily Zofran 4 mg- 1 tab every 6 hours PRN Trazodone 50 mg- .5-1 tab at bedtime as needed        Vascepa 1gm- 2 capsules twice daily     Patient declined the following medications this month: ***  Refills requested from providers include: Vascepa  Ropinirole  Ramipril  Trintellix  Solifenacin  Rybelsus  Rosuvastatin   {Delivery date:25786}   Any concerns about your medications? {yes/no:20286}  How often do you forget or accidentally miss a dose? {Missed doses:25554}  Do you use a pillbox? {yes/no:20286}  Is patient in packaging {yes/no:20286}  If yes  What is the date on your next pill pack?  Any concerns or issues with your packaging?   Recent blood pressure readings are as follows:***  Recent blood glucose readings are as follows:***   Chart review: Recent office visits:  02-28-2022 Thana Ates, RN (CCM)  02-28-2022 Rochel Brome, MD. Glucose= 107, Total protein= 5.8, Albumin= 3.7. A1C= 5.8. LDL=126, Chol/HDL= 4.7. Referral placed to pulmonology. START  amoxicillin 875 mg twice daily. STOP ambien and eszopiclone.   Recent consult visits:  None  Hospital visits:  Medication Reconciliation was completed by comparing discharge summary, patient's EMR and Pharmacy list, and upon discussion with patient.  Admitted to the hospital on 02-19-2022 due to nausea and vomiting . Discharge date was 02-19-2022. Discharged from [place] ED at [place]?Medications Started at [facility] Discharge:?? Reglan 10 mg every 6 hours PRN  Medication Changes at Hospital Discharge: None  Medications Discontinued at Hospital Discharge: None  Medications that remain the same after Hospital Discharge:??  -All other medications will remain the same.    Hospital visits:  Medication Reconciliation was completed by comparing discharge summary, patient's EMR and Pharmacy list, and upon discussion with patient.  Admitted to the hospital on 02-17-2022 due to nausea  . Discharge date was 02-19-2022. Discharged from [place] ED at [place]?Medications Started at [facility] Discharge:?? None  Medication Changes at Hospital Discharge: None  Medications Discontinued at Hospital Discharge: None  Medications that remain the same after Hospital Discharge:??  -All other medications will remain the same.    Medications: Outpatient Encounter Medications as of 05/02/2022  Medication Sig   ASPIRIN 81 PO Take 81 mg by mouth daily.   blood glucose meter kit and supplies Dispense based on patient and insurance preference. Use up to four times daily as directed. (FOR ICD-10 E10.9, E11.9).  Blood Glucose Monitoring Suppl (ONETOUCH VERIO) w/Device KIT Use daily to check FBS   budesonide-formoterol (SYMBICORT) 160-4.5 MCG/ACT inhaler Inhale 2 puffs into the lungs 2 (two) times daily.   Cyanocobalamin 2500 MCG SUBL Place 1 tablet (2,500 mcg total) under the tongue daily.   dicyclomine (BENTYL) 20 MG tablet Take 1 tablet (20 mg total) by  mouth every 6 (six) hours.   ezetimibe (ZETIA) 10 MG tablet TAKE 1 TABLET BY MOUTH EVERY DAY   fluticasone (FLONASE) 50 MCG/ACT nasal spray SPRAY 2 SPRAYS INTO EACH NOSTRIL EVERY DAY   glucose blood (ONETOUCH VERIO) test strip E11.42 Use new test strip each time when checking FBS   icosapent Ethyl (VASCEPA) 1 g capsule Take 2 capsules (2 g total) by mouth 2 (two) times daily.   Lancets (ONETOUCH DELICA PLUS [id number]) MISC E 11.42 Use new lancet each time when checking FBS   loratadine (CLARITIN) 10 MG tablet Take 1 tablet (10 mg total) by mouth daily.   metoCLOPramide (REGLAN) 10 MG tablet Take 1 tablet (10 mg total) by mouth every 6 (six) hours as needed for nausea.   Multiple Vitamin (MULTIVITAMIN WITH MINERALS) TABS tablet Take 1 tablet by mouth daily.   omeprazole (PRILOSEC) 20 MG capsule Take 1 capsule (20 mg total) by mouth daily.   ondansetron (ZOFRAN) 4 MG tablet TAKE ONE TABLET BY MOUTH EVERY 6 HOURS AS NEEDED FOR NAUSEA AND VOMITING   phentermine 15 MG capsule Take 1 capsule (15 mg total) by mouth every morning.   pregabalin (LYRICA) 300 MG capsule TAKE 1 CAPSULE BY MOUTH TWICE A DAY   promethazine (PHENERGAN) 25 MG tablet Take 1 tablet (25 mg total) by mouth every 6 (six) hours as needed for nausea or vomiting.   ramipril (ALTACE) 2.5 MG capsule TAKE ONE CAPSULE BY MOUTH ONCE DAILY   rOPINIRole (REQUIP) 3 MG tablet TAKE 1 TABLET BY MOUTH ONCE DAILY 1-3 HOURS BEFORE  BEDTIME   rosuvastatin (CRESTOR) 40 MG tablet Take 1 tablet (40 mg total) by mouth daily.   Semaglutide (RYBELSUS) 14 MG TABS Take 1 tablet (14 mg total) by mouth daily.   solifenacin (VESICARE) 5 MG tablet Take 1 tablet (5 mg total) by mouth daily.   topiramate (TOPAMAX) 50 MG tablet Take 1 tablet (50 mg total) by mouth daily.   topiramate (TOPAMAX) 50 MG tablet Take 1 tablet (50 mg total) by mouth daily.   triamcinolone cream (KENALOG) 0.1 % apply one application TOPICALLY twice daily   vitamin B-12 1000 MCG tablet  Take 1 tablet (1,000 mcg total) by mouth daily.   vortioxetine HBr (TRINTELLIX) 20 MG TABS tablet Take 1 tablet (20 mg total) by mouth daily.   No facility-administered encounter medications on file as of 05/02/2022.   BP Readings from Last 3 Encounters:  02/28/22 128/70  02/19/22 127/71  02/17/22 (!) 142/87    Pulse Readings from Last 3 Encounters:  02/28/22 90  02/19/22 84  02/17/22 91    Lab Results  Component Value Date/Time   HGBA1C 5.8 (H) 02/28/2022 09:49 AM   HGBA1C 5.7 (H) 11/23/2021 10:19 AM   Lab Results  Component Value Date   CREATININE 0.70 02/28/2022   BUN 9 02/28/2022   GFRNONAA >60 02/19/2022   GFRAA 77 03/24/2020   NA 136 02/28/2022   K 4.6 02/28/2022   CALCIUM 9.0 02/28/2022   CO2 24 02/28/2022   05-02-2022: 1st attempt left VM  [place] Clinical Pharmacist Assistant [phone]

## 2022-05-03 NOTE — Progress Notes (Signed)
  Care Coordination Note  05/03/2022 Name: Hailey Greene MRN: [id number] DOB: November 23, 1950  Hailey Greene is a 72 y.o. year old female who is a primary care patient of Cox, Kirsten, MD and is actively engaged with the care management team. I reached out to [name] by phone today to assist with re-scheduling a follow up visit with the RN Case Manager  Follow up plan: We have been unable to make contact with the patient for follow up.    Julian Hy, [place] Direct Dial: [phone]

## 2022-05-04 ENCOUNTER — Other Ambulatory Visit: Payer: Self-pay | Admitting: Family Medicine

## 2022-05-04 MED ORDER — PHENTERMINE HCL 37.5 MG PO CAPS: 37.5000 mg | ORAL_CAPSULE | ORAL | 0 refills | Status: DC

## 2022-05-06 ENCOUNTER — Other Ambulatory Visit: Payer: Self-pay | Admitting: Family Medicine

## 2022-05-17 ENCOUNTER — Telehealth: Payer: Self-pay

## 2022-05-17 NOTE — Progress Notes (Signed)
Care Management & Coordination Services Pharmacy Team  Reason for Encounter: General adherence update   Contacted patient for general health update and medication adherence call.   Unsuccessful outreach. Left voicemail for patient to return call.  Chart Updates:  Recent office visits:  05/04/22 Rochel Brome MD. Orders Only. Increased Phentermine to 37.'5mg'$ .   Recent consult visits:  None  Hospital visits:  None  Medications: Outpatient Encounter Medications as of 05/17/2022  Medication Sig   phentermine 37.5 MG capsule Take 1 capsule (37.5 mg total) by mouth every morning.   ASPIRIN 81 PO Take 81 mg by mouth daily.   blood glucose meter kit and supplies Dispense based on patient and insurance preference. Use up to four times daily as directed. (FOR ICD-10 E10.9, E11.9).   Blood Glucose Monitoring Suppl (ONETOUCH VERIO) w/Device KIT Use daily to check FBS   budesonide-formoterol (SYMBICORT) 160-4.5 MCG/ACT inhaler Inhale 2 puffs into the lungs 2 (two) times daily.   Cyanocobalamin 2500 MCG SUBL Place 1 tablet (2,500 mcg total) under the tongue daily.   dicyclomine (BENTYL) 20 MG tablet TAKE ONE TABLET BY MOUTH every SIX hours   ezetimibe (ZETIA) 10 MG tablet TAKE 1 TABLET BY MOUTH EVERY DAY   fluticasone (FLONASE) 50 MCG/ACT nasal spray SPRAY 2 SPRAYS INTO EACH NOSTRIL EVERY DAY   glucose blood (ONETOUCH VERIO) test strip E11.42 Use new test strip each time when checking FBS   icosapent Ethyl (VASCEPA) 1 g capsule Take 2 capsules (2 g total) by mouth 2 (two) times daily.   Lancets (ONETOUCH DELICA PLUS 123XX123) MISC E 11.42 Use new lancet each time when checking FBS   loratadine (CLARITIN) 10 MG tablet Take 1 tablet (10 mg total) by mouth daily.   metoCLOPramide (REGLAN) 10 MG tablet Take 1 tablet (10 mg total) by mouth every 6 (six) hours as needed for nausea.   Multiple Vitamin (MULTIVITAMIN WITH MINERALS) TABS tablet Take 1 tablet by mouth daily.   omeprazole (PRILOSEC) 20 MG  capsule Take 1 capsule (20 mg total) by mouth daily.   ondansetron (ZOFRAN) 4 MG tablet TAKE ONE TABLET BY MOUTH EVERY 6 HOURS AS NEEDED FOR NAUSEA AND VOMITING   pregabalin (LYRICA) 300 MG capsule TAKE 1 CAPSULE BY MOUTH TWICE A DAY   promethazine (PHENERGAN) 25 MG tablet Take 1 tablet (25 mg total) by mouth every 6 (six) hours as needed for nausea or vomiting.   ramipril (ALTACE) 2.5 MG capsule Take 1 capsule (2.5 mg total) by mouth daily.   rOPINIRole (REQUIP) 3 MG tablet TAKE 1 TABLET BY MOUTH ONCE DAILY 1-3 HOURS BEFORE  BEDTIME   rosuvastatin (CRESTOR) 40 MG tablet Take 1 tablet (40 mg total) by mouth daily.   Semaglutide (RYBELSUS) 14 MG TABS Take 1 tablet (14 mg total) by mouth daily.   solifenacin (VESICARE) 5 MG tablet Take 1 tablet (5 mg total) by mouth daily.   topiramate (TOPAMAX) 50 MG tablet Take 1 tablet (50 mg total) by mouth daily.   triamcinolone cream (KENALOG) 0.1 % apply one application TOPICALLY twice daily   vitamin B-12 1000 MCG tablet Take 1 tablet (1,000 mcg total) by mouth daily.   vortioxetine HBr (TRINTELLIX) 20 MG TABS tablet Take 1 tablet (20 mg total) by mouth daily.   No facility-administered encounter medications on file as of 05/17/2022.    Recent vitals BP Readings from Last 3 Encounters:  02/28/22 128/70  02/19/22 127/71  02/17/22 (!) 142/87   Pulse Readings from Last 3 Encounters:  02/28/22 90  02/19/22 84  02/17/22 91   Wt Readings from Last 3 Encounters:  02/28/22 188 lb (85.3 kg)  12/20/21 194 lb (88 kg)  12/02/21 196 lb (88.9 kg)   BMI Readings from Last 3 Encounters:  02/28/22 33.30 kg/m  12/20/21 34.37 kg/m  12/02/21 34.72 kg/m    Recent lab results    Component Value Date/Time   NA 136 02/28/2022 0949   K 4.6 02/28/2022 0949   CL 98 02/28/2022 0949   CO2 24 02/28/2022 0949   GLUCOSE 107 (H) 02/28/2022 0949   GLUCOSE 115 (H) 02/19/2022 0712   BUN 9 02/28/2022 0949   CREATININE 0.70 02/28/2022 0949   CALCIUM 9.0  02/28/2022 0949    Lab Results  Component Value Date   CREATININE 0.70 02/28/2022   EGFR 93 02/28/2022   GFRNONAA >60 02/19/2022   GFRAA 77 03/24/2020   Lab Results  Component Value Date/Time   HGBA1C 5.8 (H) 02/28/2022 09:49 AM   HGBA1C 5.7 (H) 11/23/2021 10:19 AM   MICROALBUR 30 09/25/2020 10:36 AM   MICROALBUR 30 05/31/2019 11:19 AM    Lab Results  Component Value Date   CHOL 189 02/28/2022   HDL 40 02/28/2022   LDLCALC 126 (H) 02/28/2022   TRIG 125 02/28/2022   CHOLHDL 4.7 (H) 02/28/2022    Care Gaps: Annual wellness visit in last year? No  If Diabetic: Last eye exam / retinopathy screening: Last diabetic foot exam: 01/22/21 Last UACR: 02/28/22  Star Rating Drugs:  Medication:  Last Fill: Day Supply Ramipril   05/10/22-/02/04/22 90ds Rosuvastatin   05/10/22-/02/04/22 90ds Semaglutide   05/10/22-03/01/22 30ds/75ds    Elray Mcgregor, [place] Pharmacist Assistant  [phone]

## 2022-05-17 NOTE — Progress Notes (Cosign Needed)
Called patient to discuss Adherence packaging service with [facility], no answer, [facility].  05/20/2022- Called patient again, she gave consent for switching to adherence packaging, patient aware that [facility] will contact her to review medication times for packs.     Pattricia Boss, [place] Pharmacist Assistant [phone]

## 2022-05-20 DIAGNOSIS — G4733 Obstructive sleep apnea (adult) (pediatric): Secondary | ICD-10-CM | POA: Diagnosis not present

## 2022-05-27 DIAGNOSIS — G4733 Obstructive sleep apnea (adult) (pediatric): Secondary | ICD-10-CM | POA: Diagnosis not present

## 2022-05-28 DIAGNOSIS — R509 Fever, unspecified: Secondary | ICD-10-CM | POA: Diagnosis not present

## 2022-05-28 DIAGNOSIS — R5381 Other malaise: Secondary | ICD-10-CM | POA: Diagnosis not present

## 2022-05-28 DIAGNOSIS — R0602 Shortness of breath: Secondary | ICD-10-CM | POA: Diagnosis not present

## 2022-05-30 DIAGNOSIS — I10 Essential (primary) hypertension: Secondary | ICD-10-CM | POA: Diagnosis not present

## 2022-05-30 DIAGNOSIS — R059 Cough, unspecified: Secondary | ICD-10-CM | POA: Diagnosis not present

## 2022-05-30 DIAGNOSIS — J069 Acute upper respiratory infection, unspecified: Secondary | ICD-10-CM | POA: Diagnosis not present

## 2022-06-01 ENCOUNTER — Telehealth: Payer: Self-pay

## 2022-06-01 ENCOUNTER — Encounter: Payer: Medicare Other | Admitting: Family Medicine

## 2022-06-01 ENCOUNTER — Other Ambulatory Visit: Payer: Self-pay

## 2022-06-01 NOTE — Assessment & Plan Note (Signed)
The current medical regimen is effective;  continue present plan and medications. Continue vesicare 5 mg daily.

## 2022-06-01 NOTE — Progress Notes (Deleted)
Subjective:  Patient ID: Hailey Greene, female    DOB: [birth year]  Age: 72 y.o. MRN: [id number]  Chief Complaint  Patient presents with   Diabetes   Hypertension    HPI   Diabetes:  Complications: hypertension, neuropathy. Glucose checking: Daily Glucose logs:110-122 Hypoglycemia: No Most recent A1C: 5.8% Current medications: Rybelsus 7 mg daily. Last Eye Exam: 08/05/2021 Foot checks:Daily  Asthma: controlled with symbicort 2 puffs twice daily,  Hyperlipidemia: Current medications: Vascepa 2g twice a day, Rosuvastatin 40 mg daily.  Hypertension: Current medications: Ramipril 2.5 mg daily.  Depression: Taking Trintellix 20 mg daily.     03/02/2022    3:45 PM 12/29/2021    1:16 PM 11/23/2021    9:30 AM 05/07/2021    9:40 AM 03/22/2021    2:56 PM  Depression screen PHQ 2/9  Decreased Interest 0 0 0 0 0  Down, Depressed, Hopeless 0 0 0 0 0  PHQ - 2 Score 0 0 0 0 0  Altered sleeping 0  0    Tired, decreased energy 1  0    Change in appetite 0  0    Feeling bad or failure about yourself  0  0    Trouble concentrating 0  0    Moving slowly or fidgety/restless   0    Suicidal thoughts 0  0    PHQ-9 Score 1  0    Difficult doing work/chores Not difficult at all  Not difficult at all           05/07/2021    9:40 AM 12/29/2021    1:20 PM 02/17/2022    2:20 PM 02/19/2022    7:06 AM 02/28/2022    9:00 AM  Fall Risk  Falls in the past year? 0 1   0  Was there an injury with Fall? 0 0   0  Fall Risk Category Calculator 0 1   0  Fall Risk Category (Retired) Low Low   Low  (RETIRED) Patient Fall Risk Level   Low fall risk Low fall risk Low fall risk  Patient at Risk for Falls Due to     No Fall Risks  Fall risk Follow up Falls evaluation completed    Falls evaluation completed      Review of Systems  Current Outpatient Medications on File Prior to Visit  Medication Sig Dispense Refill   phentermine 37.5 MG capsule Take 1 capsule (37.5 mg total) by mouth every  morning. 30 capsule 0   ASPIRIN 81 PO Take 81 mg by mouth daily.     blood glucose meter kit and supplies Dispense based on patient and insurance preference. Use up to four times daily as directed. (FOR ICD-10 E10.9, E11.9). 1 each 0   Blood Glucose Monitoring Suppl (ONETOUCH VERIO) w/Device KIT Use daily to check FBS 1 kit 0   budesonide-formoterol (SYMBICORT) 160-4.5 MCG/ACT inhaler Inhale 2 puffs into the lungs 2 (two) times daily. 1 each 3   Cyanocobalamin 2500 MCG SUBL Place 1 tablet (2,500 mcg total) under the tongue daily. 90 tablet 1   dicyclomine (BENTYL) 20 MG tablet TAKE ONE TABLET BY MOUTH every SIX hours 360 tablet 1   ezetimibe (ZETIA) 10 MG tablet TAKE 1 TABLET BY MOUTH EVERY DAY 90 tablet 0   fluticasone (FLONASE) 50 MCG/ACT nasal spray SPRAY 2 SPRAYS INTO EACH NOSTRIL EVERY DAY 48 mL 2   glucose blood (ONETOUCH VERIO) test strip E11.42 Use new test strip each time when  checking FBS 100 each 2   icosapent Ethyl (VASCEPA) 1 g capsule Take 2 capsules (2 g total) by mouth 2 (two) times daily. 360 capsule 0   Lancets (ONETOUCH DELICA PLUS 123XX123) MISC E 11.42 Use new lancet each time when checking FBS 100 each 2   loratadine (CLARITIN) 10 MG tablet Take 1 tablet (10 mg total) by mouth daily. 30 tablet 11   metoCLOPramide (REGLAN) 10 MG tablet Take 1 tablet (10 mg total) by mouth every 6 (six) hours as needed for nausea. 15 tablet 0   Multiple Vitamin (MULTIVITAMIN WITH MINERALS) TABS tablet Take 1 tablet by mouth daily.     omeprazole (PRILOSEC) 20 MG capsule Take 1 capsule (20 mg total) by mouth daily. 90 capsule 1   ondansetron (ZOFRAN) 4 MG tablet TAKE ONE TABLET BY MOUTH EVERY 6 HOURS AS NEEDED FOR NAUSEA AND VOMITING 90 tablet 1   pregabalin (LYRICA) 300 MG capsule TAKE 1 CAPSULE BY MOUTH TWICE A DAY 180 capsule 1   promethazine (PHENERGAN) 25 MG tablet Take 1 tablet (25 mg total) by mouth every 6 (six) hours as needed for nausea or vomiting. 30 tablet 0   ramipril (ALTACE) 2.5  MG capsule Take 1 capsule (2.5 mg total) by mouth daily. 90 capsule 1   rOPINIRole (REQUIP) 3 MG tablet TAKE 1 TABLET BY MOUTH ONCE DAILY 1-3 HOURS BEFORE  BEDTIME 90 tablet 1   rosuvastatin (CRESTOR) 40 MG tablet Take 1 tablet (40 mg total) by mouth daily. 90 tablet 0   Semaglutide (RYBELSUS) 14 MG TABS Take 1 tablet (14 mg total) by mouth daily. 90 tablet 0   solifenacin (VESICARE) 5 MG tablet Take 1 tablet (5 mg total) by mouth daily. 90 tablet 1   topiramate (TOPAMAX) 50 MG tablet Take 1 tablet (50 mg total) by mouth daily. 90 tablet 0   triamcinolone cream (KENALOG) 0.1 % apply one application TOPICALLY twice daily 80 g 3   vitamin B-12 1000 MCG tablet Take 1 tablet (1,000 mcg total) by mouth daily. 30 tablet 0   vortioxetine HBr (TRINTELLIX) 20 MG TABS tablet Take 1 tablet (20 mg total) by mouth daily. 90 tablet 1   No current facility-administered medications on file prior to visit.   Past Medical History:  Diagnosis Date   2019 novel coronavirus disease (COVID-19) 12/31/2018   Acute hypoxemic respiratory failure due to COVID-19 (HCC)    Chicken pox    Depression    Diabetes (HCC)    GERD (gastroesophageal reflux disease)    Hypertension    Idiopathic progressive neuropathy    Major depressive disorder    Mixed hyperlipidemia    Neuropathy    Osteoarthritis    Pneumonia    Primary insomnia    Primary osteoarthritis of left knee 05/31/2019   RLS (restless legs syndrome)    S/P total knee replacement 07/15/2019   Status post total knee replacement 07/25/2019   Tachycardia    Urge incontinence    UTI (lower urinary tract infection)    Weakness    Past Surgical History:  Procedure Laterality Date   ABDOMINAL HYSTERECTOMY     APPENDECTOMY     CHOLECYSTECTOMY     HERNIA REPAIR     REPLACEMENT TOTAL KNEE Right    TONSILLECTOMY     TOTAL KNEE ARTHROPLASTY Left 07/15/2019   Procedure: TOTAL KNEE ARTHROPLASTY;  Surgeon: Vickey Huger, MD;  Location: [facility];  Service: Orthopedics;   Laterality: Left;    Family History  Problem Relation Age of Onset   Thyroid disease Mother    Cancer Mother    Migraines Mother    Brain cancer Father    Heart disease Maternal Grandmother    Diabetes Daughter    Social History   Socioeconomic History   Marital status: Married    Spouse name: Not on file   Number of children: 3   Years of education: 12   Highest education level: Not on file  Occupational History   Occupation: Housewife  Tobacco Use   Smoking status: Former    Packs/day: 2.00    Years: 12.00    Total pack years: 24.00    Types: Cigarettes    Quit date: 05/25/2010    Years since quitting: 12.0   Smokeless tobacco: Never  Vaping Use   Vaping Use: Never used  Substance and Sexual Activity   Alcohol use: Yes    Comment: occasional   Drug use: No   Sexual activity: Not on file  Other Topics Concern   Not on file  Social History Narrative   Born and raised in [place], [place]. Currently lives in a house with her husband. 1 dog. Fun: Garden, feed birds, swimming   Denies any religious beliefs effecting health care.    Social Determinants of Health   Financial Resource Strain: High Risk (02/12/2020)   Overall Financial Resource Strain (CARDIA)    Difficulty of Paying Living Expenses: Hard  Food Insecurity: No Food Insecurity (12/29/2021)   Hunger Vital Sign    Worried About Running Out of Food in the Last Year: Never true    Ran Out of Food in the Last Year: Never true  Transportation Needs: No Transportation Needs (12/29/2021)   PRAPARE - Hydrologist (Medical): No    Lack of Transportation (Non-Medical): No  Physical Activity: Insufficiently Active (02/12/2020)   Exercise Vital Sign    Days of Exercise per Week: 7 days    Minutes of Exercise per Session: 20 min  Stress: Not on file  Social Connections: Not on file    Objective:  There were no vitals taken for this visit.     02/28/2022    8:53 AM 02/19/2022    2:09 PM  02/19/2022    1:10 PM  BP/Weight  Systolic BP 0000000 [id number]  Diastolic BP 70 71 86  Wt. (Lbs) 188    BMI 33.3 kg/m2      Physical Exam  Diabetic Foot Exam - Simple   No data filed      Lab Results  Component Value Date   WBC 8.2 02/28/2022   HGB 13.9 02/28/2022   HCT 43.6 02/28/2022   PLT 288 02/28/2022   GLUCOSE 107 (H) 02/28/2022   CHOL 189 02/28/2022   TRIG 125 02/28/2022   HDL 40 02/28/2022   LDLCALC 126 (H) 02/28/2022   ALT 10 02/28/2022   AST 17 02/28/2022   NA 136 02/28/2022   K 4.6 02/28/2022   CL 98 02/28/2022   CREATININE 0.70 02/28/2022   BUN 9 02/28/2022   CO2 24 02/28/2022   TSH 0.552 11/23/2021   HGBA1C 5.8 (H) 02/28/2022   MICROALBUR 30 09/25/2020      Assessment & Plan:    Hypertension associated with diabetes ([place]) Assessment & Plan: Well controlled.  No changes to medicines. Ramipril, aspirin Continue to work on eating a healthy diet and exercise.  Labs drawn today.     Diabetic polyneuropathy associated with type  2 diabetes mellitus ([place]) Assessment & Plan: Control: excellent Recommend check sugars fasting daily. Recommend check feet daily. Recommend annual eye exams. Medicines:  rybelsus 14 mg daily. Continue to work on eating a healthy diet and exercise.  Labs drawn today.      Idiopathic progressive neuropathy Assessment & Plan: The current medical regimen is effective;  continue present plan and medications.    Urge incontinence Assessment & Plan: The current medical regimen is effective;  continue present plan and medications. Continue vesicare 5 mg daily.    Mixed hyperlipidemia Assessment & Plan: Not quite at goal No changes to medicines. Rosuvastatin 40 mg daily, Vascepa 2 g twice a day, Zetia 10 mg daily Continue to work on eating a healthy diet and exercise.  Labs drawn today.        No orders of the defined types were placed in this encounter.   No orders of the defined types were placed in this  encounter.    Follow-up: No follow-ups on file.   I,Renay Crammer I Leal-Borjas,acting as a scribe for Rochel Brome, MD.,have documented all relevant documentation on the behalf of Rochel Brome, MD,as directed by  Rochel Brome, MD while in the presence of Rochel Brome, MD.   An After Visit Summary was printed and given to the patient.  Rochel Brome, MD [facility] [phone]

## 2022-06-01 NOTE — Assessment & Plan Note (Signed)
Well controlled.  No changes to medicines. Ramipril, aspirin Continue to work on eating a healthy diet and exercise.  Labs drawn today.

## 2022-06-01 NOTE — Progress Notes (Signed)
Care Management & Coordination Services Pharmacy Team  Reason for Encounter: Medication coordination and delivery  Contacted patient to discuss medications and coordinate delivery from [facility]. Spoke with patient on 06/03/2022  Cycle dispensing form sent to [facility] for review.   Last adherence delivery date: 05-12-2022  Patient is due for next adherence delivery on: 06-13-2022  This delivery to include: Vials  30 Days  Rosuvastatin 40mg -1 daily  Omeprazole 20mg -1 daily  Dicyclomine 20mg - 1 tab every 6 hours          Rybelsus 7mg - 1 tab once daily Budes/formot inhaler 160/4.5-2 puffs twice daily        Loratadine 10mg -1 tab once daily       Solifenacin 5mg -1 tab once daily       Topiramate 50mg - 1 daily Vitamin B12 2519mcg-1 tab under tongue daily  Trintellix 20mg -1 tab once daily     Ramipril 2.5mg -1 capsule once daily   Ropinirole 3mg -  1 tab 1-3 hours before bedtime Lyrica 300 mg- 1 capsule twice daily Phentermine 15 mg 1 tablet daily  Vascepa 1gm- 2 capsules twice daily    Zofran 4 mg- 1 tab every 6 hours PRN   Patient declined the following medications this month: Eszopiclone[facility]  Reglan- no longer needed temporarily use  Zetia- [facility]  Refills requested from providers include: Phentermine   Confirmed delivery date of 06-13-2022, advised patient that pharmacy will contact them the morning of delivery.   Any concerns about your medications? No  How often do you forget or accidentally miss a dose? Never  Do you use a pillbox? No  Is patient in packaging No  Recent blood pressure readings are as follows: Not available at the moment  Recent blood glucose readings are as follows: Not available at the moment   Chart review: Recent office visits:  None  Recent consult visits:  None  Hospital visits:  Medication Reconciliation was completed by comparing discharge summary, patient's EMR and Pharmacy list, and upon discussion  with patient.   Admitted to the hospital on 02-19-2022 due to nausea and vomiting . Discharge date was 02-19-2022. Discharged from [facility] at [place]?Medications Started at [facility] Discharge:?? Reglan 10 mg every 6 hours PRN   Medication Changes at Hospital Discharge: None   Medications Discontinued at Hospital Discharge: None   Medications that remain the same after Hospital Discharge:??  -All other medications will remain the same.     Hospital visits:  Medication Reconciliation was completed by comparing discharge summary, patient's EMR and Pharmacy list, and upon discussion with patient.   Admitted to the hospital on 02-17-2022 due to nausea  . Discharge date was 02-19-2022. Discharged from [place] [facility] at [place]?Medications Started at [facility] Discharge:?? None   Medication Changes at Hospital Discharge: None   Medications Discontinued at Hospital Discharge: None   Medications that remain the same after Hospital Discharge:??  -All other medications will remain the same.      Medications: Outpatient Encounter Medications as of 06/01/2022  Medication Sig   phentermine 37.5 MG capsule Take 1 capsule (37.5 mg total) by mouth every morning.   ASPIRIN 81 PO Take 81 mg by mouth daily.   blood glucose meter kit and supplies Dispense based on patient and insurance preference. Use up to four times daily as directed. (FOR ICD-10 E10.9, E11.9).   Blood Glucose Monitoring Suppl (ONETOUCH VERIO) w/Device  KIT Use daily to check FBS   budesonide-formoterol (SYMBICORT) 160-4.5 MCG/ACT inhaler Inhale 2 puffs into the lungs 2 (two) times daily.   Cyanocobalamin 2500 MCG SUBL Place 1 tablet (2,500 mcg total) under the tongue daily.   dicyclomine (BENTYL) 20 MG tablet TAKE ONE TABLET BY MOUTH every SIX hours   ezetimibe (ZETIA) 10 MG tablet TAKE 1 TABLET BY MOUTH EVERY DAY   fluticasone (FLONASE) 50 MCG/ACT nasal spray SPRAY 2 SPRAYS  INTO EACH NOSTRIL EVERY DAY   glucose blood (ONETOUCH VERIO) test strip E11.42 Use new test strip each time when checking FBS   icosapent Ethyl (VASCEPA) 1 g capsule Take 2 capsules (2 g total) by mouth 2 (two) times daily.   Lancets (ONETOUCH DELICA PLUS [id number]) MISC E 11.42 Use new lancet each time when checking FBS   loratadine (CLARITIN) 10 MG tablet Take 1 tablet (10 mg total) by mouth daily.   metoCLOPramide (REGLAN) 10 MG tablet Take 1 tablet (10 mg total) by mouth every 6 (six) hours as needed for nausea.   Multiple Vitamin (MULTIVITAMIN WITH MINERALS) TABS tablet Take 1 tablet by mouth daily.   omeprazole (PRILOSEC) 20 MG capsule Take 1 capsule (20 mg total) by mouth daily.   ondansetron (ZOFRAN) 4 MG tablet TAKE ONE TABLET BY MOUTH EVERY 6 HOURS AS NEEDED FOR NAUSEA AND VOMITING   pregabalin (LYRICA) 300 MG capsule TAKE 1 CAPSULE BY MOUTH TWICE A DAY   promethazine (PHENERGAN) 25 MG tablet Take 1 tablet (25 mg total) by mouth every 6 (six) hours as needed for nausea or vomiting.   ramipril (ALTACE) 2.5 MG capsule Take 1 capsule (2.5 mg total) by mouth daily.   rOPINIRole (REQUIP) 3 MG tablet TAKE 1 TABLET BY MOUTH ONCE DAILY 1-3 HOURS BEFORE  BEDTIME   rosuvastatin (CRESTOR) 40 MG tablet Take 1 tablet (40 mg total) by mouth daily.   Semaglutide (RYBELSUS) 14 MG TABS Take 1 tablet (14 mg total) by mouth daily.   solifenacin (VESICARE) 5 MG tablet Take 1 tablet (5 mg total) by mouth daily.   topiramate (TOPAMAX) 50 MG tablet Take 1 tablet (50 mg total) by mouth daily.   triamcinolone cream (KENALOG) 0.1 % apply one application TOPICALLY twice daily   vitamin B-12 1000 MCG tablet Take 1 tablet (1,000 mcg total) by mouth daily.   vortioxetine HBr (TRINTELLIX) 20 MG TABS tablet Take 1 tablet (20 mg total) by mouth daily.   No facility-administered encounter medications on file as of 06/01/2022.   BP Readings from Last 3 Encounters:  02/28/22 128/70  02/19/22 127/71  02/17/22 (!)  142/87    Pulse Readings from Last 3 Encounters:  02/28/22 90  02/19/22 84  02/17/22 91    Lab Results  Component Value Date/Time   HGBA1C 5.8 (H) 02/28/2022 09:49 AM   HGBA1C 5.7 (H) 11/23/2021 10:19 AM   Lab Results  Component Value Date   CREATININE 0.70 02/28/2022   BUN 9 02/28/2022   GFRNONAA >60 02/19/2022   GFRAA 77 03/24/2020   NA 136 02/28/2022   K 4.6 02/28/2022   CALCIUM 9.0 02/28/2022   CO2 24 02/28/2022    [place] Clinical Pharmacist Assistant [phone]

## 2022-06-01 NOTE — Assessment & Plan Note (Signed)
Control: excellent Recommend check sugars fasting daily. Recommend check feet daily. Recommend annual eye exams. Medicines:  rybelsus 14 mg daily. Continue to work on eating a healthy diet and exercise.  Labs drawn today.

## 2022-06-01 NOTE — Assessment & Plan Note (Signed)
The current medical regimen is effective;  continue present plan and medications.  

## 2022-06-01 NOTE — Assessment & Plan Note (Signed)
Not quite at goal No changes to medicines. Rosuvastatin 40 mg daily, Vascepa 2 g twice a day, Zetia 10 mg daily Continue to work on eating a healthy diet and exercise.  Labs drawn today.

## 2022-06-02 ENCOUNTER — Encounter: Payer: Self-pay | Admitting: Family Medicine

## 2022-06-02 ENCOUNTER — Ambulatory Visit (INDEPENDENT_AMBULATORY_CARE_PROVIDER_SITE_OTHER): Payer: 59 | Admitting: Family Medicine

## 2022-06-02 ENCOUNTER — Other Ambulatory Visit: Payer: Self-pay

## 2022-06-02 VITALS — BP 80/60 | HR 93 | Temp 97.4°F | Resp 14 | Ht 63.0 in | Wt 180.0 lb

## 2022-06-02 DIAGNOSIS — J209 Acute bronchitis, unspecified: Secondary | ICD-10-CM | POA: Insufficient documentation

## 2022-06-02 DIAGNOSIS — Z792 Long term (current) use of antibiotics: Secondary | ICD-10-CM | POA: Diagnosis not present

## 2022-06-02 DIAGNOSIS — E1142 Type 2 diabetes mellitus with diabetic polyneuropathy: Secondary | ICD-10-CM

## 2022-06-02 DIAGNOSIS — R9431 Abnormal electrocardiogram [ECG] [EKG]: Secondary | ICD-10-CM | POA: Diagnosis not present

## 2022-06-02 DIAGNOSIS — E860 Dehydration: Secondary | ICD-10-CM | POA: Insufficient documentation

## 2022-06-02 DIAGNOSIS — I951 Orthostatic hypotension: Secondary | ICD-10-CM | POA: Insufficient documentation

## 2022-06-02 DIAGNOSIS — Z87440 Personal history of urinary (tract) infections: Secondary | ICD-10-CM | POA: Diagnosis not present

## 2022-06-02 DIAGNOSIS — Z1152 Encounter for screening for COVID-19: Secondary | ICD-10-CM | POA: Diagnosis not present

## 2022-06-02 DIAGNOSIS — A419 Sepsis, unspecified organism: Secondary | ICD-10-CM | POA: Diagnosis not present

## 2022-06-02 DIAGNOSIS — J111 Influenza due to unidentified influenza virus with other respiratory manifestations: Secondary | ICD-10-CM | POA: Insufficient documentation

## 2022-06-02 DIAGNOSIS — R112 Nausea with vomiting, unspecified: Secondary | ICD-10-CM | POA: Diagnosis not present

## 2022-06-02 DIAGNOSIS — K219 Gastro-esophageal reflux disease without esophagitis: Secondary | ICD-10-CM | POA: Diagnosis not present

## 2022-06-02 DIAGNOSIS — F32A Depression, unspecified: Secondary | ICD-10-CM | POA: Diagnosis not present

## 2022-06-02 DIAGNOSIS — R55 Syncope and collapse: Secondary | ICD-10-CM | POA: Diagnosis not present

## 2022-06-02 DIAGNOSIS — E871 Hypo-osmolality and hyponatremia: Secondary | ICD-10-CM | POA: Diagnosis not present

## 2022-06-02 DIAGNOSIS — J208 Acute bronchitis due to other specified organisms: Secondary | ICD-10-CM | POA: Diagnosis not present

## 2022-06-02 DIAGNOSIS — Z79899 Other long term (current) drug therapy: Secondary | ICD-10-CM | POA: Diagnosis not present

## 2022-06-02 DIAGNOSIS — Z7982 Long term (current) use of aspirin: Secondary | ICD-10-CM | POA: Diagnosis not present

## 2022-06-02 DIAGNOSIS — R531 Weakness: Secondary | ICD-10-CM | POA: Diagnosis not present

## 2022-06-02 DIAGNOSIS — M159 Polyosteoarthritis, unspecified: Secondary | ICD-10-CM | POA: Diagnosis not present

## 2022-06-02 DIAGNOSIS — J069 Acute upper respiratory infection, unspecified: Secondary | ICD-10-CM

## 2022-06-02 DIAGNOSIS — I10 Essential (primary) hypertension: Secondary | ICD-10-CM | POA: Diagnosis not present

## 2022-06-02 DIAGNOSIS — I493 Ventricular premature depolarization: Secondary | ICD-10-CM | POA: Diagnosis not present

## 2022-06-02 DIAGNOSIS — E1140 Type 2 diabetes mellitus with diabetic neuropathy, unspecified: Secondary | ICD-10-CM | POA: Diagnosis not present

## 2022-06-02 DIAGNOSIS — G2581 Restless legs syndrome: Secondary | ICD-10-CM | POA: Diagnosis not present

## 2022-06-02 LAB — POCT INFLUENZA A/B
Influenza A, POC: NEGATIVE
Influenza B, POC: NEGATIVE

## 2022-06-02 LAB — POC COVID19 BINAXNOW: SARS Coronavirus 2 Ag: NEGATIVE

## 2022-06-02 MED ORDER — ONETOUCH DELICA PLUS LANCET33G MISC: 2 refills | Status: DC

## 2022-06-02 MED ORDER — ONETOUCH VERIO VI STRP: ORAL_STRIP | 2 refills | Status: DC

## 2022-06-02 NOTE — Assessment & Plan Note (Addendum)
Sent to ED.  NEEDS IV FLUIDS.

## 2022-06-02 NOTE — Assessment & Plan Note (Addendum)
SENT TO ED BY PRIVATE VEHICLE. Patient refused to go by ambulance.  NEEDS IV FLUIDS.  Patient has not been drinking or eating.

## 2022-06-02 NOTE — Assessment & Plan Note (Addendum)
Retest for covid 19 and influenza A/B are negative today.  LUNG EXAM CONCERNING FOR PNEUMONIA. RECHECK CXR.

## 2022-06-02 NOTE — Progress Notes (Signed)
Acute Office Visit  Subjective:    Patient ID: Hailey Greene, female    DOB: 11/12/1950, 72 y.o.   MRN: [id number]  Chief Complaint  Patient presents with   Dizziness   Anorexia    HPI: Patient is in today complaining of chills, fevers, dizziness, poor appetite, left earache x 5-6 days. Over the weekend patient had nausea and vomiting. Patient was seen at [facility] one week ago and diagnosed with influenza and put on tamiflu. She went to [facility] ED on Monday and diagnosed with Acute bronchitis. Per patient was told to stop tamiflu.  She was put on zithromax. She is taking zithromax. Patient has not eaten or drank anything for 5 days per her husband. She says she has sipped water.   Orthostatic VS for the past 72 hrs (Last 3 readings):  Orthostatic BP Orthostatic Pulse  06/02/22 1629 (!) 78/58 101  06/02/22 1628 120/78 97    Initial bp was 80/60 with pulse of 93. Patient became very dizzy and was unstable with drop in her bp. Dropped from 120/78 to 78/58 with going from laying to sitting. I did not stand her up.   Past Medical History:  Diagnosis Date   2019 novel coronavirus disease (COVID-19) 12/31/2018   Acute hypoxemic respiratory failure due to COVID-19 (HCC)    Chicken pox    Depression    Diabetes (HCC)    GERD (gastroesophageal reflux disease)    Hypertension    Idiopathic progressive neuropathy    Major depressive disorder    Mixed hyperlipidemia    Neuropathy    Osteoarthritis    Pneumonia    Primary insomnia    Primary osteoarthritis of left knee 05/31/2019   RLS (restless legs syndrome)    S/P total knee replacement 07/15/2019   Status post total knee replacement 07/25/2019   Tachycardia    Urge incontinence    UTI (lower urinary tract infection)    Weakness     Past Surgical History:  Procedure Laterality Date   ABDOMINAL HYSTERECTOMY     APPENDECTOMY     CHOLECYSTECTOMY     HERNIA REPAIR     REPLACEMENT TOTAL KNEE Right    TONSILLECTOMY     TOTAL KNEE  ARTHROPLASTY Left 07/15/2019   Procedure: TOTAL KNEE ARTHROPLASTY;  Surgeon: Vickey Huger, MD;  Location: [facility];  Service: Orthopedics;  Laterality: Left;    Family History  Problem Relation Age of Onset   Thyroid disease Mother    Cancer Mother    Migraines Mother    Brain cancer Father    Heart disease Maternal Grandmother    Diabetes Daughter     Social History   Socioeconomic History   Marital status: Married    Spouse name: Not on file   Number of children: 3   Years of education: 12   Highest education level: Not on file  Occupational History   Occupation: Housewife  Tobacco Use   Smoking status: Former    Packs/day: 2.00    Years: 12.00    Total pack years: 24.00    Types: Cigarettes    Quit date: 05/25/2010    Years since quitting: 12.0   Smokeless tobacco: Never  Vaping Use   Vaping Use: Never used  Substance and Sexual Activity   Alcohol use: Yes    Comment: occasional   Drug use: No   Sexual activity: Not on file  Other Topics Concern   Not on file  Social History Narrative  Born and raised in [place], [place]. Currently lives in a house with her husband. 1 dog. Fun: Garden, feed birds, swimming   Denies any religious beliefs effecting health care.    Social Determinants of Health   Financial Resource Strain: High Risk (02/12/2020)   Overall Financial Resource Strain (CARDIA)    Difficulty of Paying Living Expenses: Hard  Food Insecurity: No Food Insecurity (12/29/2021)   Hunger Vital Sign    Worried About Running Out of Food in the Last Year: Never true    Ran Out of Food in the Last Year: Never true  Transportation Needs: No Transportation Needs (12/29/2021)   PRAPARE - Hydrologist (Medical): No    Lack of Transportation (Non-Medical): No  Physical Activity: Insufficiently Active (02/12/2020)   Exercise Vital Sign    Days of Exercise per Week: 7 days    Minutes of Exercise per Session: 20 min  Stress: Not on file   Social Connections: Not on file  Intimate Partner Violence: Unknown (12/29/2021)   Humiliation, Afraid, Rape, and Kick questionnaire    Fear of Current or Ex-Partner: No    Emotionally Abused: No    Physically Abused: No    Sexually Abused: Not on file    Outpatient Medications Prior to Visit  Medication Sig Dispense Refill   ASPIRIN 81 PO Take 81 mg by mouth daily.     blood glucose meter kit and supplies Dispense based on patient and insurance preference. Use up to four times daily as directed. (FOR ICD-10 E10.9, E11.9). 1 each 0   Blood Glucose Monitoring Suppl (ONETOUCH VERIO) w/Device KIT Use daily to check FBS 1 kit 0   budesonide-formoterol (SYMBICORT) 160-4.5 MCG/ACT inhaler Inhale 2 puffs into the lungs 2 (two) times daily. 1 each 3   Cyanocobalamin 2500 MCG SUBL Place 1 tablet (2,500 mcg total) under the tongue daily. 90 tablet 1   dicyclomine (BENTYL) 20 MG tablet TAKE ONE TABLET BY MOUTH every SIX hours 360 tablet 1   Eszopiclone 3 MG TABS Take 3 mg by mouth at bedtime.     ezetimibe (ZETIA) 10 MG tablet TAKE 1 TABLET BY MOUTH EVERY DAY 90 tablet 0   fluticasone (FLONASE) 50 MCG/ACT nasal spray SPRAY 2 SPRAYS INTO EACH NOSTRIL EVERY DAY 48 mL 2   glucose blood (ONETOUCH VERIO) test strip E11.42 Use new test strip each time when checking FBS 100 each 2   icosapent Ethyl (VASCEPA) 1 g capsule Take 2 capsules (2 g total) by mouth 2 (two) times daily. 360 capsule 0   Lancets (ONETOUCH DELICA PLUS 123XX123) MISC E 11.42 Use new lancet each time when checking FBS 100 each 2   loratadine (CLARITIN) 10 MG tablet Take 1 tablet (10 mg total) by mouth daily. 30 tablet 11   metoCLOPramide (REGLAN) 10 MG tablet Take 1 tablet (10 mg total) by mouth every 6 (six) hours as needed for nausea. 15 tablet 0   Multiple Vitamin (MULTIVITAMIN WITH MINERALS) TABS tablet Take 1 tablet by mouth daily.     omeprazole (PRILOSEC) 20 MG capsule Take 1 capsule (20 mg total) by mouth daily. 90 capsule 1    ondansetron (ZOFRAN) 4 MG tablet TAKE ONE TABLET BY MOUTH EVERY 6 HOURS AS NEEDED FOR NAUSEA AND VOMITING 90 tablet 1   phentermine 37.5 MG capsule Take 1 capsule (37.5 mg total) by mouth every morning. 30 capsule 0   pregabalin (LYRICA) 300 MG capsule TAKE 1 CAPSULE BY MOUTH TWICE  A DAY 180 capsule 1   promethazine (PHENERGAN) 25 MG tablet Take 1 tablet (25 mg total) by mouth every 6 (six) hours as needed for nausea or vomiting. 30 tablet 0   ramipril (ALTACE) 2.5 MG capsule Take 1 capsule (2.5 mg total) by mouth daily. 90 capsule 1   rOPINIRole (REQUIP) 3 MG tablet TAKE 1 TABLET BY MOUTH ONCE DAILY 1-3 HOURS BEFORE  BEDTIME 90 tablet 1   rosuvastatin (CRESTOR) 40 MG tablet Take 1 tablet (40 mg total) by mouth daily. 90 tablet 0   Semaglutide (RYBELSUS) 14 MG TABS Take 1 tablet (14 mg total) by mouth daily. 90 tablet 0   solifenacin (VESICARE) 5 MG tablet Take 1 tablet (5 mg total) by mouth daily. 90 tablet 1   Suvorexant (BELSOMRA) 10 MG TABS      topiramate (TOPAMAX) 50 MG tablet Take 1 tablet (50 mg total) by mouth daily. 90 tablet 0   triamcinolone cream (KENALOG) 0.1 % apply one application TOPICALLY twice daily 80 g 3   vitamin B-12 1000 MCG tablet Take 1 tablet (1,000 mcg total) by mouth daily. 30 tablet 0   vortioxetine HBr (TRINTELLIX) 20 MG TABS tablet Take 1 tablet (20 mg total) by mouth daily. 90 tablet 1   ondansetron (ZOFRAN-ODT) 4 MG disintegrating tablet Take 4 mg by mouth every 6 (six) hours as needed.     traZODone (DESYREL) 50 MG tablet Take 25-50 mg by mouth at bedtime as needed.     oseltamivir (TAMIFLU) 75 MG capsule Take 75 mg by mouth 2 (two) times daily. (Patient not taking: Reported on 06/02/2022)     No facility-administered medications prior to visit.    No Known Allergies  Review of Systems  Constitutional:  Positive for chills and diaphoresis. Negative for fever.  HENT:  Positive for ear pain. Negative for congestion and sinus pain.   Respiratory:  Positive for  cough and shortness of breath.   Cardiovascular:  Negative for chest pain.  Neurological:  Positive for dizziness.       Objective:        06/02/2022    4:05 PM 02/28/2022    8:53 AM 02/19/2022    2:09 PM  Vitals with BMI  Height '5\' 3"'$  '5\' 3"'$    Weight 180 lbs 188 lbs   BMI 123XX123 [id number]   Systolic 80 0000000 [id number]  Diastolic 60 70 71  Pulse 93 90 84    Orthostatic VS for the past 72 hrs (Last 3 readings):  Orthostatic BP Orthostatic Pulse  06/02/22 1629 (!) 78/58 101  06/02/22 1628 120/78 97     Physical Exam Vitals reviewed.  Constitutional:      Appearance: She is ill-appearing (pale. seems confused about her medicines.).  HENT:     Right Ear: Tympanic membrane, ear canal and external ear normal.     Left Ear: Ear canal and external ear normal. Tympanic membrane is erythematous.     Nose: Nose normal.     Right Sinus: No maxillary sinus tenderness.     Left Sinus: No maxillary sinus tenderness.     Mouth/Throat:     Pharynx: Oropharynx is clear.  Cardiovascular:     Rate and Rhythm: Normal rate and regular rhythm.     Heart sounds: Normal heart sounds. No murmur heard. Pulmonary:     Effort: Pulmonary effort is normal. No respiratory distress.     Breath sounds: Rhonchi (Diffuse on inhalation and exhalation. Occasional wheezes.) present.  Lymphadenopathy:  Cervical: No cervical adenopathy.  Neurological:     Mental Status: She is alert.  Psychiatric:        Mood and Affect: Mood normal.        Behavior: Behavior normal.     Health Maintenance Due  Topic Date Due   COLONOSCOPY (Pts 45-13yr Insurance coverage will need to be confirmed)  Never done   Lung Cancer Screening  06/30/2020   COVID-19 Vaccine (5 - 2023-24 season) 11/26/2021   FOOT EXAM  01/22/2022   Medicare Annual Wellness (AWV)  03/11/2022   Diabetic kidney evaluation - Urine ACR  05/07/2022    There are no preventive care reminders to display for this patient.   Lab Results  Component Value  Date   TSH 0.552 11/23/2021   Lab Results  Component Value Date   WBC 8.2 02/28/2022   HGB 13.9 02/28/2022   HCT 43.6 02/28/2022   MCV 89 02/28/2022   PLT 288 02/28/2022   Lab Results  Component Value Date   NA 136 02/28/2022   K 4.6 02/28/2022   CO2 24 02/28/2022   GLUCOSE 107 (H) 02/28/2022   BUN 9 02/28/2022   CREATININE 0.70 02/28/2022   BILITOT <0.2 02/28/2022   ALKPHOS 98 02/28/2022   AST 17 02/28/2022   ALT 10 02/28/2022   PROT 5.8 (L) 02/28/2022   ALBUMIN 3.7 (L) 02/28/2022   CALCIUM 9.0 02/28/2022   ANIONGAP 16 (H) 02/19/2022   EGFR 93 02/28/2022   Lab Results  Component Value Date   CHOL 189 02/28/2022   Lab Results  Component Value Date   HDL 40 02/28/2022   Lab Results  Component Value Date   LDLCALC 126 (H) 02/28/2022   Lab Results  Component Value Date   TRIG 125 02/28/2022   Lab Results  Component Value Date   CHOLHDL 4.7 (H) 02/28/2022   Lab Results  Component Value Date   HGBA1C 5.8 (H) 02/28/2022       Assessment & Plan:  Acute upper respiratory infection -     POC COVID-19 BinaxNow -     POCT Influenza A/B  Dehydration Assessment & Plan: SENT TO ED BY PRIVATE VEHICLE. Patient refused to go by ambulance.  NEEDS IV FLUIDS.  Patient has not been drinking or eating.    Orthostatic hypotension Assessment & Plan: Sent to ED.  NEEDS IV FLUIDS.    Acute bronchitis due to other specified organisms Assessment & Plan: Retest for covid 19 and influenza A/B are negative today.  LUNG EXAM CONCERNING FOR PNEUMONIA. RECHECK CXR.        No orders of the defined types were placed in this encounter.   Orders Placed This Encounter  Procedures   POC COVID-19   Influenza A/B     Follow-up: Return for after ed visit. missed chronic visit yesterday. .Marland Kitchen An After Visit Summary was printed and given to the patient.  KRochel Brome MD [facility] [phone]

## 2022-06-03 DIAGNOSIS — R55 Syncope and collapse: Secondary | ICD-10-CM | POA: Diagnosis not present

## 2022-06-03 DIAGNOSIS — E860 Dehydration: Secondary | ICD-10-CM | POA: Diagnosis not present

## 2022-06-05 NOTE — Progress Notes (Signed)
This encounter was created in error - please disregard.

## 2022-06-07 ENCOUNTER — Other Ambulatory Visit: Payer: Self-pay | Admitting: Family Medicine

## 2022-06-08 ENCOUNTER — Telehealth: Payer: Self-pay

## 2022-06-08 NOTE — Telephone Encounter (Signed)
        Patient  visited  on 3/4   Telephone encounter attempt :   1st A HIPAA compliant voice message was left requesting a return call.  Instructed patient to call back .   Chelcey Caputo [facility] Care Guide, [place] [phone] [address], [place], [place] [address] Phone: [phone] Email: [email or web address]    

## 2022-06-09 ENCOUNTER — Other Ambulatory Visit: Payer: Self-pay | Admitting: Family Medicine

## 2022-06-09 ENCOUNTER — Other Ambulatory Visit: Payer: Self-pay

## 2022-06-10 MED ORDER — PHENTERMINE HCL 37.5 MG PO CAPS: 37.5000 mg | ORAL_CAPSULE | ORAL | 0 refills | Status: DC

## 2022-06-16 ENCOUNTER — Telehealth: Payer: Self-pay | Admitting: Family Medicine

## 2022-06-16 NOTE — Telephone Encounter (Signed)
Patient called this afternoon upset that she has received a warning letter for no shows. Pt stated that she always calls to cancel her appointments even if it is the day of. Patient was notified of our cancellation policy and she processed to say that well how am I supposed to call 24 hours prior to the appointment if I have car trouble. Patient stated that she does not drive and her husband take her to every appointment. The morning of 06/01/2022, the patient stated she called the day of the appointment to cancel due to not having a ride. Pt stated that her husband was called into work for an emergency that morning due to 72 year old women who was without heat during the night. Patient stated well I guess I will have to find a new doctor. I told the patient that I am sorry that she feels that way but this was a warning letter not a dismissal. She stated that she should not have received it. Pt notified that I will document this conversation and send this to Dr. Tobie Poet. It will be up to her on if the appointment no show can be revoked or not.   I have rescheduled her appointment for 07/19/2022.   I spoke with [place] and [place], both of them stating that they did not speak with Nunzio Cory about her chronic appointment, only when she called to get an appointment for a sick visit.  Dr. Tobie Poet, do you want me to revoke this no show for 06/01/2022 or leave it as a no show?

## 2022-06-16 NOTE — Telephone Encounter (Signed)
Pt called about a no show letter and was very agrumentive about her appt was not a no show. I stated the no show policy that the doctor doesn't always discharge pts due

## 2022-06-17 NOTE — Telephone Encounter (Signed)
I spoke with Dr. Tobie Poet. Do not revoke the no show at this time.

## 2022-06-18 DIAGNOSIS — G4733 Obstructive sleep apnea (adult) (pediatric): Secondary | ICD-10-CM | POA: Diagnosis not present

## 2022-06-21 ENCOUNTER — Other Ambulatory Visit: Payer: Self-pay

## 2022-06-21 MED ORDER — ESZOPICLONE 3 MG PO TABS: 3.0000 mg | ORAL_TABLET | Freq: Every day | ORAL | 2 refills | Status: DC

## 2022-06-22 ENCOUNTER — Other Ambulatory Visit: Payer: Self-pay | Admitting: Family Medicine

## 2022-06-22 DIAGNOSIS — R110 Nausea: Secondary | ICD-10-CM

## 2022-06-29 ENCOUNTER — Other Ambulatory Visit: Payer: Self-pay | Admitting: Family Medicine

## 2022-06-29 DIAGNOSIS — G603 Idiopathic progressive neuropathy: Secondary | ICD-10-CM

## 2022-06-29 DIAGNOSIS — E1142 Type 2 diabetes mellitus with diabetic polyneuropathy: Secondary | ICD-10-CM

## 2022-06-30 ENCOUNTER — Telehealth: Payer: Self-pay

## 2022-06-30 ENCOUNTER — Other Ambulatory Visit: Payer: Self-pay

## 2022-06-30 DIAGNOSIS — E1142 Type 2 diabetes mellitus with diabetic polyneuropathy: Secondary | ICD-10-CM

## 2022-06-30 DIAGNOSIS — G603 Idiopathic progressive neuropathy: Secondary | ICD-10-CM

## 2022-06-30 NOTE — Progress Notes (Signed)
Care Management & Coordination Services Pharmacy Team  Reason for Encounter: Medication coordination and delivery  Contacted patient to discuss medications and coordinate delivery from [facility]. Spoke with patient on 06/30/2022  Cycle dispensing form sent to [facility] for review.   Last adherence delivery date: 06-13-2022  Patient is due for next adherence delivery on: 07-12-2022  This delivery to include: Adherence Packaging  30 Days  Rosuvastatin 40 mg at B Omeprazole 20 mg at B Dicyclomine 20 mg- 1 tab every 6 hours  (Vials)        Rybelsus 7 mg at B Budes/formot inhaler 160/4.5-2 puffs twice daily        Loratadine 10 mg at B  Solifenacin 5 mg at B      Topiramate 50 mg at B Vitamin B12 2558mcg-1 tab under tongue daily (Vials) Trintellix 20 mg at B   Ramipril 2.5 mg at B Ropinirole 3mg -  1 tab 1-3 hours before bedtime (Vial) Lyrica 300 mg at B and EM Phentermine 15 mg at B Vascepa 1 gm 2 at B 2 EM   Zofran 4 mg- 1 tab every 6 hours PRN (Vial) Test strips Lancets  Patient declined the following medications this month: Lunesta- Filled 06-27-2022 [facility] Will [facility] to [facility] Zetia- Filled 06-03-2022 90 [facility]. Will request [facility] to [facility] Reglan- no longer needed temporarily use   Refills requested from providers include: Lyrica  Confirmed delivery date of 07-12-2022, advised patient that pharmacy will contact them the morning of delivery.   Any concerns about your medications? No  How often do you forget or accidentally miss a dose? Never  Do you use a pillbox? No  Is patient in packaging Yes First packaging delivery   Recent blood pressure readings are as follows: Not available at the moment   Recent blood glucose readings are as follows: Not available at the moment    Chart review: Recent office visits:  06-02-2022 Rochel Brome, MD. Negative flu and covid vaccine. Stop trazodone  Recent consult visits:   None  Hospital visits:  Medication Reconciliation was completed by comparing discharge summary, patient's EMR and Pharmacy list, and upon discussion with patient.   Admitted to the hospital on 02-19-2022 due to nausea and vomiting . Discharge date was 02-19-2022. Discharged from [facility] [facility] at [facility]?Medications Started at [facility] Discharge:?? Reglan 10 mg every 6 hours PRN   Medication Changes at Hospital Discharge: None   Medications Discontinued at Hospital Discharge: None   Medications that remain the same after Hospital Discharge:??  -All other medications will remain the same.     Hospital visits:  Medication Reconciliation was completed by comparing discharge summary, patient's EMR and Pharmacy list, and upon discussion with patient.   Admitted to the hospital on 02-17-2022 due to nausea  . Discharge date was 02-19-2022. Discharged from [facility] at [place]?Medications Started at [facility] Discharge:?? None   Medication Changes at Hospital Discharge: None   Medications Discontinued at Hospital Discharge: None   Medications that remain the same after Hospital Discharge:??  -All other medications will remain the same.      Medications: Outpatient Encounter Medications as of 06/30/2022  Medication Sig   ASPIRIN 81 PO Take 81 mg by mouth daily.   blood glucose meter kit and supplies Dispense based on patient and insurance preference. Use up to four times daily as directed. (  FOR ICD-10 E10.9, E11.9).   Blood Glucose Monitoring Suppl (ONETOUCH VERIO) w/Device KIT Use daily to check FBS   budesonide-formoterol (SYMBICORT) 160-4.5 MCG/ACT inhaler Inhale 2 puffs into the lungs 2 (two) times daily.   Cyanocobalamin 2500 MCG SUBL Place 1 tablet (2,500 mcg total) under the tongue daily.   dicyclomine (BENTYL) 20 MG tablet TAKE ONE TABLET BY MOUTH every SIX hours   Eszopiclone 3 MG TABS Take 1 tablet (3 mg total) by mouth at  bedtime.   ezetimibe (ZETIA) 10 MG tablet TAKE 1 TABLET BY MOUTH EVERY DAY   fluticasone (FLONASE) 50 MCG/ACT nasal spray SPRAY 2 SPRAYS INTO EACH NOSTRIL EVERY DAY   glucose blood (ONETOUCH VERIO) test strip E11.42 Use new test strip each time when checking FBS   icosapent Ethyl (VASCEPA) 1 g capsule Take 2 capsules (2 g total) by mouth 2 (two) times daily.   Lancets (ONETOUCH DELICA PLUS 123XX123) MISC E 11.42 Use new lancet each time when checking FBS   loratadine (CLARITIN) 10 MG tablet Take 1 tablet (10 mg total) by mouth daily.   metoCLOPramide (REGLAN) 10 MG tablet Take 1 tablet (10 mg total) by mouth every 6 (six) hours as needed for nausea.   Multiple Vitamin (MULTIVITAMIN WITH MINERALS) TABS tablet Take 1 tablet by mouth daily.   omeprazole (PRILOSEC) 20 MG capsule Take 1 capsule (20 mg total) by mouth daily.   ondansetron (ZOFRAN) 4 MG tablet TAKE ONE TABLET BY MOUTH EVERY 6 HOURS AS NEEDED FOR NAUSEA/VOMITING   oseltamivir (TAMIFLU) 75 MG capsule Take 75 mg by mouth 2 (two) times daily. (Patient not taking: Reported on 06/02/2022)   phentermine 37.5 MG capsule Take 1 capsule (37.5 mg total) by mouth every morning.   pregabalin (LYRICA) 300 MG capsule TAKE 1 CAPSULE BY MOUTH TWICE A DAY   promethazine (PHENERGAN) 25 MG tablet Take 1 tablet (25 mg total) by mouth every 6 (six) hours as needed for nausea or vomiting.   ramipril (ALTACE) 2.5 MG capsule Take 1 capsule (2.5 mg total) by mouth daily.   rOPINIRole (REQUIP) 3 MG tablet TAKE 1 TABLET BY MOUTH ONCE DAILY 1-3 HOURS BEFORE  BEDTIME   rosuvastatin (CRESTOR) 40 MG tablet Take 1 tablet (40 mg total) by mouth daily.   Semaglutide (RYBELSUS) 14 MG TABS Take 1 tablet (14 mg total) by mouth daily.   solifenacin (VESICARE) 5 MG tablet Take 1 tablet (5 mg total) by mouth daily.   Suvorexant (BELSOMRA) 10 MG TABS    topiramate (TOPAMAX) 50 MG tablet Take 1 tablet (50 mg total) by mouth daily.   triamcinolone cream (KENALOG) 0.1 % apply one  application TOPICALLY twice daily   vitamin B-12 1000 MCG tablet Take 1 tablet (1,000 mcg total) by mouth daily.   vortioxetine HBr (TRINTELLIX) 20 MG TABS tablet Take 1 tablet (20 mg total) by mouth daily.   No facility-administered encounter medications on file as of 06/30/2022.   BP Readings from Last 3 Encounters:  06/02/22 (!) 80/60  02/28/22 128/70  02/19/22 127/71    Pulse Readings from Last 3 Encounters:  06/02/22 93  02/28/22 90  02/19/22 84    Lab Results  Component Value Date/Time   HGBA1C 5.8 (H) 02/28/2022 09:49 AM   HGBA1C 5.7 (H) 11/23/2021 10:19 AM   Lab Results  Component Value Date   CREATININE 0.70 02/28/2022   BUN 9 02/28/2022   GFRNONAA >60 02/19/2022   GFRAA 77 03/24/2020   NA 136 02/28/2022   K  4.6 02/28/2022   CALCIUM 9.0 02/28/2022   CO2 24 02/28/2022     [facility] Clinical Pharmacist Assistant [phone]

## 2022-07-04 NOTE — Telephone Encounter (Signed)
Called patient and she is currently taking Eszopiclone (lunesta) 3 mg.

## 2022-07-05 NOTE — Telephone Encounter (Signed)
DC Suvorexant per PCP

## 2022-07-05 NOTE — Addendum Note (Signed)
Addended by: Zettie Pho on: 07/05/2022 01:59 PM   Modules accepted: Orders

## 2022-07-15 ENCOUNTER — Other Ambulatory Visit: Payer: Self-pay | Admitting: Family Medicine

## 2022-07-15 DIAGNOSIS — K219 Gastro-esophageal reflux disease without esophagitis: Secondary | ICD-10-CM

## 2022-07-19 ENCOUNTER — Ambulatory Visit: Payer: Medicare Other | Admitting: Family Medicine

## 2022-07-19 DIAGNOSIS — G4733 Obstructive sleep apnea (adult) (pediatric): Secondary | ICD-10-CM | POA: Diagnosis not present

## 2022-07-23 ENCOUNTER — Other Ambulatory Visit: Payer: Self-pay | Admitting: Family Medicine

## 2022-07-28 NOTE — Progress Notes (Signed)
Subjective:  Patient ID: Hailey Greene, female    DOB: 06-15-1950  Age: 72 y.o. MRN: [id number]  Chief Complaint  Patient presents with   Diabetes   Hyperlipidemia    HPI   Diabetes:  Complications: hypertension, neuropathy. Glucose checking: Daily Glucose logs:110-122 Hypoglycemia: No Most recent A1C: 5.8% Current medications: Rybelsus 14 mg daily. Last Eye Exam: 08/05/2021 Foot checks:Daily  Asthma: controlled with symbicort 2 puffs twice daily,  Hyperlipidemia: Current medications: Vascepa 2g twice a day, Rosuvastatin 40 mg daily, zetia 10 mg daily  Hypertension: Current medications: Ramipril 2.5 mg daily.  Depression: Taking Trintellix 20 mg daily.  GERD: Takes Omeprazole 20 mg daily IBS: on bentyl: takes twice daily usually  Rest legs syndrome: Ropinirole 3 mg at bedtime.  Incontinence urinary: Taking Vesicare 5 mg daily.     07/29/2022    9:16 AM 03/02/2022    3:45 PM 12/29/2021    1:16 PM 11/23/2021    9:30 AM 05/07/2021    9:40 AM  Depression screen PHQ 2/9  Decreased Interest 0 0 0 0 0  Down, Depressed, Hopeless 1 0 0 0 0  PHQ - 2 Score 1 0 0 0 0  Altered sleeping 0 0  0   Tired, decreased energy 0 1  0   Change in appetite 0 0  0   Feeling bad or failure about yourself  0 0  0   Trouble concentrating 0 0  0   Moving slowly or fidgety/restless 0   0   Suicidal thoughts 0 0  0   PHQ-9 Score 1 1  0   Difficult doing work/chores Not difficult at all Not difficult at all  Not difficult at all         07/29/2022    9:15 AM  Fall Risk   Falls in the past year? 1  Number falls in past yr: 0  Injury with Fall? 0  Risk for fall due to : Impaired mobility  Follow up Falls evaluation completed;Falls prevention discussed    Patient Care Team: Blane Ohara, MD as PCP - General (Family Medicine) Dannielle Huh, MD as Consulting Physician (Orthopedic Surgery)   Review of Systems  Constitutional:  Negative for chills, fatigue and fever.  HENT:  Positive  for rhinorrhea. Negative for congestion and sore throat.   Respiratory:  Negative for cough and shortness of breath.   Cardiovascular:  Negative for chest pain.  Gastrointestinal:  Positive for constipation. Negative for abdominal pain, diarrhea, nausea and vomiting.  Genitourinary:  Positive for frequency and urgency. Negative for dysuria.  Musculoskeletal:  Negative for back pain and myalgias (restless legs).  Neurological:  Positive for dizziness. Negative for weakness, light-headedness and headaches.  Psychiatric/Behavioral:  Positive for dysphoric mood. The patient is nervous/anxious.     Current Outpatient Medications on File Prior to Visit  Medication Sig Dispense Refill   ASPIRIN 81 PO Take 81 mg by mouth daily.     blood glucose meter kit and supplies Dispense based on patient and insurance preference. Use up to four times daily as directed. (FOR ICD-10 E10.9, E11.9). 1 each 0   Blood Glucose Monitoring Suppl (ONETOUCH VERIO) w/Device KIT Use daily to check FBS 1 kit 0   budesonide-formoterol (SYMBICORT) 160-4.5 MCG/ACT inhaler Inhale 2 puffs into the lungs 2 (two) times daily. 1 each 3   Cyanocobalamin 2500 MCG SUBL Place 1 tablet (2,500 mcg total) under the tongue daily. 90 tablet 1   dicyclomine (BENTYL) 20 MG  tablet TAKE ONE TABLET BY MOUTH every SIX hours 360 tablet 1   Eszopiclone 3 MG TABS TAKE 1 TABLET BY MOUTH AT BEDTIME. 30 tablet 2   ezetimibe (ZETIA) 10 MG tablet TAKE 1 TABLET BY MOUTH EVERY DAY 90 tablet 0   fluticasone (FLONASE) 50 MCG/ACT nasal spray SPRAY 2 SPRAYS INTO EACH NOSTRIL EVERY DAY 48 mL 2   glucose blood (ONETOUCH VERIO) test strip E11.42 Use new test strip each time when checking FBS 100 each 2   icosapent Ethyl (VASCEPA) 1 g capsule Take 2 capsules (2 g total) by mouth 2 (two) times daily. 360 capsule 0   Lancets (ONETOUCH DELICA PLUS LANCET33G) MISC E 11.42 Use new lancet each time when checking FBS 100 each 2   loratadine (CLARITIN) 10 MG tablet Take 1  tablet (10 mg total) by mouth daily. 30 tablet 11   metoCLOPramide (REGLAN) 10 MG tablet Take 1 tablet (10 mg total) by mouth every 6 (six) hours as needed for nausea. 15 tablet 0   Multiple Vitamin (MULTIVITAMIN WITH MINERALS) TABS tablet Take 1 tablet by mouth daily.     omeprazole (PRILOSEC) 20 MG capsule TAKE ONE CAPSULE BY MOUTH DAILY 90 capsule 0   ondansetron (ZOFRAN) 4 MG tablet TAKE ONE TABLET BY MOUTH EVERY 6 HOURS AS NEEDED FOR NAUSEA/VOMITING 90 tablet 1   pregabalin (LYRICA) 300 MG capsule TAKE ONE CAPSULE BY MOUTH TWICE DAILY 60 capsule 2   promethazine (PHENERGAN) 25 MG tablet Take 1 tablet (25 mg total) by mouth every 6 (six) hours as needed for nausea or vomiting. 30 tablet 0   ramipril (ALTACE) 2.5 MG capsule Take 1 capsule (2.5 mg total) by mouth daily. 90 capsule 1   rOPINIRole (REQUIP) 3 MG tablet TAKE 1 TABLET BY MOUTH ONCE DAILY 1-3 HOURS BEFORE  BEDTIME 90 tablet 1   rosuvastatin (CRESTOR) 40 MG tablet Take 1 tablet (40 mg total) by mouth daily. 90 tablet 0   Semaglutide (RYBELSUS) 14 MG TABS Take 1 tablet (14 mg total) by mouth daily. 90 tablet 0   solifenacin (VESICARE) 5 MG tablet Take 1 tablet (5 mg total) by mouth daily. 90 tablet 1   topiramate (TOPAMAX) 50 MG tablet Take 1 tablet (50 mg total) by mouth daily. 90 tablet 0   triamcinolone cream (KENALOG) 0.1 % apply one application TOPICALLY twice daily 80 g 3   vitamin B-12 1000 MCG tablet Take 1 tablet (1,000 mcg total) by mouth daily. 30 tablet 0   vortioxetine HBr (TRINTELLIX) 20 MG TABS tablet Take 1 tablet (20 mg total) by mouth daily. 90 tablet 1   No current facility-administered medications on file prior to visit.   Past Medical History:  Diagnosis Date   2019 novel coronavirus disease (COVID-19) 12/31/2018   Acute hypoxemic respiratory failure due to COVID-19 [facility])    Chicken pox    Depression    Diabetes (HCC)    GERD (gastroesophageal reflux disease)    Hypertension    Idiopathic progressive  neuropathy    Major depressive disorder    Mixed hyperlipidemia    Neuropathy    Osteoarthritis    Pneumonia    Primary insomnia    Primary osteoarthritis of left knee 05/31/2019   RLS (restless legs syndrome)    S/P total knee replacement 07/15/2019   Status post total knee replacement 07/25/2019   Tachycardia    Urge incontinence    UTI (lower urinary tract infection)    Weakness    Past  Surgical History:  Procedure Laterality Date   ABDOMINAL HYSTERECTOMY     APPENDECTOMY     CHOLECYSTECTOMY     HERNIA REPAIR     REPLACEMENT TOTAL KNEE Right    TONSILLECTOMY     TOTAL KNEE ARTHROPLASTY Left 07/15/2019   Procedure: TOTAL KNEE ARTHROPLASTY;  Surgeon: Dannielle Huh, MD;  Location: [facility];  Service: Orthopedics;  Laterality: Left;    Family History  Problem Relation Age of Onset   Thyroid disease Mother    Cancer Mother    Migraines Mother    Brain cancer Father    Heart disease Maternal Grandmother    Diabetes Daughter    Social History   Socioeconomic History   Marital status: Married    Spouse name: Not on file   Number of children: 3   Years of education: 12   Highest education level: Not on file  Occupational History   Occupation: Housewife  Tobacco Use   Smoking status: Former    Packs/day: 2.00    Years: 12.00    Additional pack years: 0.00    Total pack years: 24.00    Types: Cigarettes    Quit date: 05/25/2010    Years since quitting: 12.2   Smokeless tobacco: Never  Vaping Use   Vaping Use: Never used  Substance and Sexual Activity   Alcohol use: Yes    Comment: occasional   Drug use: No   Sexual activity: Not on file  Other Topics Concern   Not on file  Social History Narrative   Born and raised in [place], [place]. Currently lives in a house with her husband. 1 dog. Fun: Garden, feed birds, swimming   Denies any religious beliefs effecting health care.    Social Determinants of Health   Financial Resource Strain: High Risk (02/12/2020)   Overall  Financial Resource Strain (CARDIA)    Difficulty of Paying Living Expenses: Hard  Food Insecurity: No Food Insecurity (12/29/2021)   Hunger Vital Sign    Worried About Running Out of Food in the Last Year: Never true    Ran Out of Food in the Last Year: Never true  Transportation Needs: No Transportation Needs (12/29/2021)   PRAPARE - Administrator, Civil Service (Medical): No    Lack of Transportation (Non-Medical): No  Physical Activity: Insufficiently Active (02/12/2020)   Exercise Vital Sign    Days of Exercise per Week: 7 days    Minutes of Exercise per Session: 20 min  Stress: Not on file  Social Connections: Not on file    Objective:  BP 120/60   Pulse 80   Temp (!) 97.1 F (36.2 C)   Resp 16   Ht 5\' 3"  (1.6 m)   Wt 193 lb (87.5 kg)   BMI 34.19 kg/m      07/29/2022    9:12 AM 06/02/2022    4:05 PM 02/28/2022    8:53 AM  BP/Weight  Systolic BP 120 80 128  Diastolic BP 60 60 70  Wt. (Lbs) 193 180 188  BMI 34.19 kg/m2 31.89 kg/m2 33.3 kg/m2    Physical Exam Vitals reviewed.  Constitutional:      Appearance: Normal appearance. She is obese.  Neck:     Vascular: No carotid bruit.  Cardiovascular:     Rate and Rhythm: Normal rate and regular rhythm.     Heart sounds: Normal heart sounds.  Pulmonary:     Effort: Pulmonary effort is normal. No respiratory distress.  Breath sounds: Normal breath sounds.  Abdominal:     General: Abdomen is flat. Bowel sounds are normal.     Palpations: Abdomen is soft.     Tenderness: There is no abdominal tenderness.  Neurological:     Mental Status: She is alert and oriented to person, place, and time.  Psychiatric:        Mood and Affect: Mood normal.        Behavior: Behavior normal.     Diabetic Foot Exam - Simple   Simple Foot Form Diabetic Foot exam was performed with the following findings: Yes 07/29/2022 12:09 AM  Visual Inspection No deformities, no ulcerations, no other skin breakdown bilaterally:  Yes Sensation Testing See comments: Yes Pulse Check Posterior Tibialis and Dorsalis pulse intact bilaterally: Yes Comments Hypersensitive feet.       Lab Results  Component Value Date   WBC 8.2 02/28/2022   HGB 13.9 02/28/2022   HCT 43.6 02/28/2022   PLT 288 02/28/2022   GLUCOSE 98 07/29/2022   CHOL 120 07/29/2022   TRIG 88 07/29/2022   HDL 57 07/29/2022   LDLCALC 46 07/29/2022   ALT 14 07/29/2022   AST 18 07/29/2022   NA 134 07/29/2022   K 5.3 (H) 07/29/2022   CL 99 07/29/2022   CREATININE 0.85 07/29/2022   BUN 18 07/29/2022   CO2 19 (L) 07/29/2022   TSH 0.552 11/23/2021   HGBA1C 5.7 (H) 07/29/2022   MICROALBUR 30 09/25/2020      Assessment & Plan:    Hypertension associated with diabetes (HCC) Assessment & Plan: Continue Ramipril 2.5 mg daily.  Healthy diet.  Orders: -     CBC with Differential/Platelet -     Comprehensive metabolic panel -     DG Bone Density; Future  Diabetic polyneuropathy associated with type 2 diabetes mellitus (HCC) Assessment & Plan: Continue Rybelsus 14 mg daily.  Daily foot checks.   Blood sugar checks daily.    Orders: -     Hemoglobin A1c -     Microalbumin / creatinine urine ratio  Mixed hyperlipidemia Assessment & Plan: Well controlled.  Continue Vascepa 2 G twice daily. Rosuvastatin 40 mg daily. Zetia 10 mg daily.   Recommend continue to work on eating healthy diet and exercise.   Orders: -     Lipid panel  Encounter for screening mammogram for malignant neoplasm of breast -     Digital Screening Mammogram, Left and Right; Future  RLS (restless legs syndrome) Assessment & Plan: Add Elavil 25 mg daily.   Continue Ropinirole 3 mg at bedtime.   Orders: -     Amitriptyline HCl; Take 1 tablet (25 mg total) by mouth at bedtime.  Dispense: 30 tablet; Refill: 2     Meds ordered this encounter  Medications   DISCONTD: amitriptyline (ELAVIL) tablet 25 mg   amitriptyline (ELAVIL) 25 MG tablet    Sig: Take 1  tablet (25 mg total) by mouth at bedtime.    Dispense:  30 tablet    Refill:  2    Orders Placed This Encounter  Procedures   MM DIGITAL SCREENING BILATERAL   DG Bone Density   CBC with Differential/Platelet   Comprehensive metabolic panel   Hemoglobin A1c   Lipid panel   Microalbumin/Creatinine Ratio, Urine     Follow-up: Return in about 3 months (around 10/29/2022) for chronic follow up, fasting.   Clayborn Bigness I Leal-Borjas,acting as a scribe for Blane Ohara, MD.,have documented all relevant documentation on  the behalf of Blane Ohara, MD,as directed by  Blane Ohara, MD while in the presence of Blane Ohara, MD.   An After Visit Summary was printed and given to the patient.  I attest that I have reviewed this visit and agree with the plan scribed by my staff.   Blane Ohara, MD [facility] [phone]

## 2022-07-29 ENCOUNTER — Ambulatory Visit (INDEPENDENT_AMBULATORY_CARE_PROVIDER_SITE_OTHER): Payer: 59 | Admitting: Family Medicine

## 2022-07-29 ENCOUNTER — Encounter: Payer: Self-pay | Admitting: Family Medicine

## 2022-07-29 VITALS — BP 120/60 | HR 80 | Temp 97.1°F | Resp 16 | Ht 63.0 in | Wt 193.0 lb

## 2022-07-29 DIAGNOSIS — E1159 Type 2 diabetes mellitus with other circulatory complications: Secondary | ICD-10-CM

## 2022-07-29 DIAGNOSIS — E1142 Type 2 diabetes mellitus with diabetic polyneuropathy: Secondary | ICD-10-CM

## 2022-07-29 DIAGNOSIS — G2581 Restless legs syndrome: Secondary | ICD-10-CM | POA: Diagnosis not present

## 2022-07-29 DIAGNOSIS — I152 Hypertension secondary to endocrine disorders: Secondary | ICD-10-CM | POA: Diagnosis not present

## 2022-07-29 DIAGNOSIS — E782 Mixed hyperlipidemia: Secondary | ICD-10-CM | POA: Diagnosis not present

## 2022-07-29 DIAGNOSIS — Z1231 Encounter for screening mammogram for malignant neoplasm of breast: Secondary | ICD-10-CM

## 2022-07-29 MED ORDER — AMITRIPTYLINE HCL 25 MG PO TABS
25.0000 mg | ORAL_TABLET | Freq: Every day | ORAL | 2 refills | Status: DC
Start: 2022-07-29 — End: 2022-09-27

## 2022-07-29 MED ORDER — AMITRIPTYLINE HCL 10 MG PO TABS
25.0000 mg | ORAL_TABLET | Freq: Every day | ORAL | Status: DC
Start: 2022-07-29 — End: 2022-07-29

## 2022-07-29 NOTE — Assessment & Plan Note (Signed)
Add Elavil 25 mg daily.   Continue Ropinirole 3 mg at bedtime.

## 2022-07-29 NOTE — Assessment & Plan Note (Signed)
Continue Rybelsus 14 mg daily.  Daily foot checks.   Blood sugar checks daily.

## 2022-07-29 NOTE — Assessment & Plan Note (Signed)
Continue Ramipril 2.5 mg daily.  Healthy diet.

## 2022-07-29 NOTE — Assessment & Plan Note (Addendum)
Well controlled.  Continue Vascepa 2 G twice daily. Rosuvastatin 40 mg daily. Zetia 10 mg daily.   Recommend continue to work on eating healthy diet and exercise.

## 2022-07-30 LAB — COMPREHENSIVE METABOLIC PANEL
ALT: 14 IU/L (ref 0–32)
AST: 18 IU/L (ref 0–40)
Albumin/Globulin Ratio: 1.9 (ref 1.2–2.2)
Albumin: 4.2 g/dL (ref 3.8–4.8)
Alkaline Phosphatase: 87 IU/L (ref 44–121)
BUN/Creatinine Ratio: 21 (ref 12–28)
BUN: 18 mg/dL (ref 8–27)
Bilirubin Total: 0.4 mg/dL (ref 0.0–1.2)
CO2: 19 mmol/L — ABNORMAL LOW (ref 20–29)
Calcium: 9.3 mg/dL (ref 8.7–10.3)
Chloride: 99 mmol/L (ref 96–106)
Creatinine, Ser: 0.85 mg/dL (ref 0.57–1.00)
Globulin, Total: 2.2 g/dL (ref 1.5–4.5)
Glucose: 98 mg/dL (ref 70–99)
Potassium: 5.3 mmol/L — ABNORMAL HIGH (ref 3.5–5.2)
Sodium: 134 mmol/L (ref 134–144)
Total Protein: 6.4 g/dL (ref 6.0–8.5)
eGFR: 73 mL/min/{1.73_m2} (ref >59)

## 2022-07-30 LAB — HEMOGLOBIN A1C
Est. average glucose Bld gHb Est-mCnc: 117 mg/dL
Hgb A1c MFr Bld: 5.7 % — ABNORMAL HIGH (ref 4.8–5.6)

## 2022-07-30 LAB — LIPID PANEL
Chol/HDL Ratio: 2.1 ratio (ref 0.0–4.4)
Cholesterol, Total: 120 mg/dL (ref 100–199)
HDL: 57 mg/dL (ref >39)
LDL Chol Calc (NIH): 46 mg/dL (ref 0–99)
Triglycerides: 88 mg/dL (ref 0–149)
VLDL Cholesterol Cal: 17 mg/dL (ref 5–40)

## 2022-07-30 LAB — MICROALBUMIN / CREATININE URINE RATIO
Creatinine, Urine: 33.7 mg/dL
Microalb/Creat Ratio: <9 mg/g creat (ref 0–29)
Microalbumin, Urine: <3 ug/mL

## 2022-08-01 ENCOUNTER — Other Ambulatory Visit: Payer: Self-pay

## 2022-08-01 ENCOUNTER — Telehealth: Payer: Self-pay

## 2022-08-01 MED ORDER — PHENTERMINE HCL 37.5 MG PO CAPS: 37.5000 mg | ORAL_CAPSULE | ORAL | 0 refills | Status: DC

## 2022-08-01 NOTE — Progress Notes (Unsigned)
Care Management & Coordination Services Pharmacy Team  Reason for Encounter: Medication coordination and delivery  Contacted patient to discuss medications and coordinate delivery from [facility]. {US HC Outreach:28874} Cycle dispensing form sent to *** for review.   Last adherence delivery date: 07-12-2022  Patient is due for next adherence delivery on: 08-11-2022  This delivery to include: Vials  30 Days  Rosuvastatin 40 mg at B Omeprazole 20 mg at B Dicyclomine 20 mg- 1 tab every 6 hours  (Vials)        Rybelsus 7 mg at B Budes/formot inhaler 160/4.5-2 puffs twice daily        Loratadine 10 mg at B  Solifenacin 5 mg at B      Topiramate 50 mg at B Vitamin B12 2514mcg-1 tab under tongue daily (Vials) Trintellix 20 mg at B   Ramipril 2.5 mg at B Ropinirole 3mg -  1 tab 1-3 hours before bedtime (Vial) Phentermine 15 mg at B Vascepa 1 gm 2 at B 2 EM   Zofran 4 mg- 1 tab every 6 hours PRN (Vial) Amitriptyline 25 mg 1 with evening meals Lunesta 3 mg 1 with evening meals  Patient declined the following medications this month: ***  Refills requested from providers include: Rosuvastatin  Rybelsus  Loratadine  Vitamin B12   Phentermine  Vascepa   {Delivery date:25786}   Any concerns about your medications? {yes/no:20286}  How often do you forget or accidentally miss a dose? {Missed doses:25554}  Do you use a pillbox? {yes/no:20286}  Is patient in packaging {yes/no:20286}  If yes  What is the date on your next pill pack?  Any concerns or issues with your packaging?   Recent blood pressure readings are as follows:***  Recent blood glucose readings are as follows:***   Chart review: Recent office visits:  07-29-2022 Blane Ohara, MD. DG bone density  an mammogram ordered. Completed tamiflu. Start amitriptyline 25 mg daily at bedtime  Recent consult visits:  None  Hospital visits:  Medication Reconciliation was completed by comparing discharge  summary, patient's EMR and Pharmacy list, and upon discussion with patient.   Admitted to the hospital on 02-19-2022 due to nausea and vomiting . Discharge date was 02-19-2022. Discharged from [facility] health ED at [facility].     New?Medications Started at [facility] Discharge:?? Reglan 10 mg every 6 hours PRN   Medication Changes at Hospital Discharge: None   Medications Discontinued at Hospital Discharge: None   Medications that remain the same after Hospital Discharge:??  -All other medications will remain the same.     Hospital visits:  Medication Reconciliation was completed by comparing discharge summary, patient's EMR and Pharmacy list, and upon discussion with patient.   Admitted to the hospital on 02-17-2022 due to nausea  . Discharge date was 02-19-2022. Discharged from [facility] health ED at [facility].     New?Medications Started at [facility] Discharge:?? None   Medication Changes at Hospital Discharge: None   Medications Discontinued at Hospital Discharge: None   Medications that remain the same after Hospital Discharge:??  -All other medications will remain the same.    Medications: Outpatient Encounter Medications as of 08/01/2022  Medication Sig   amitriptyline (ELAVIL) 25 MG tablet Take 1 tablet (25 mg total) by mouth at bedtime.   ASPIRIN 81 PO Take 81 mg by mouth daily.   blood glucose meter kit and supplies Dispense based on patient and insurance preference. Use up to four times daily as directed. (FOR ICD-10 E10.9,  E11.9).   Blood Glucose Monitoring Suppl (ONETOUCH VERIO) w/Device KIT Use daily to check FBS   budesonide-formoterol (SYMBICORT) 160-4.5 MCG/ACT inhaler Inhale 2 puffs into the lungs 2 (two) times daily.   Cyanocobalamin 2500 MCG SUBL Place 1 tablet (2,500 mcg total) under the tongue daily.   dicyclomine (BENTYL) 20 MG tablet TAKE ONE TABLET BY MOUTH every SIX hours   Eszopiclone 3 MG TABS TAKE 1 TABLET BY MOUTH AT BEDTIME.    ezetimibe (ZETIA) 10 MG tablet TAKE 1 TABLET BY MOUTH EVERY DAY   fluticasone (FLONASE) 50 MCG/ACT nasal spray SPRAY 2 SPRAYS INTO EACH NOSTRIL EVERY DAY   glucose blood (ONETOUCH VERIO) test strip E11.42 Use new test strip each time when checking FBS   icosapent Ethyl (VASCEPA) 1 g capsule Take 2 capsules (2 g total) by mouth 2 (two) times daily.   Lancets (ONETOUCH DELICA PLUS LANCET33G) MISC E 11.42 Use new lancet each time when checking FBS   loratadine (CLARITIN) 10 MG tablet Take 1 tablet (10 mg total) by mouth daily.   metoCLOPramide (REGLAN) 10 MG tablet Take 1 tablet (10 mg total) by mouth every 6 (six) hours as needed for nausea.   Multiple Vitamin (MULTIVITAMIN WITH MINERALS) TABS tablet Take 1 tablet by mouth daily.   omeprazole (PRILOSEC) 20 MG capsule TAKE ONE CAPSULE BY MOUTH DAILY   ondansetron (ZOFRAN) 4 MG tablet TAKE ONE TABLET BY MOUTH EVERY 6 HOURS AS NEEDED FOR NAUSEA/VOMITING   phentermine 37.5 MG capsule Take 1 capsule (37.5 mg total) by mouth every morning.   pregabalin (LYRICA) 300 MG capsule TAKE ONE CAPSULE BY MOUTH TWICE DAILY   promethazine (PHENERGAN) 25 MG tablet Take 1 tablet (25 mg total) by mouth every 6 (six) hours as needed for nausea or vomiting.   ramipril (ALTACE) 2.5 MG capsule Take 1 capsule (2.5 mg total) by mouth daily.   rOPINIRole (REQUIP) 3 MG tablet TAKE 1 TABLET BY MOUTH ONCE DAILY 1-3 HOURS BEFORE  BEDTIME   rosuvastatin (CRESTOR) 40 MG tablet Take 1 tablet (40 mg total) by mouth daily.   Semaglutide (RYBELSUS) 14 MG TABS Take 1 tablet (14 mg total) by mouth daily.   solifenacin (VESICARE) 5 MG tablet Take 1 tablet (5 mg total) by mouth daily.   topiramate (TOPAMAX) 50 MG tablet Take 1 tablet (50 mg total) by mouth daily.   triamcinolone cream (KENALOG) 0.1 % apply one application TOPICALLY twice daily   vitamin B-12 1000 MCG tablet Take 1 tablet (1,000 mcg total) by mouth daily.   vortioxetine HBr (TRINTELLIX) 20 MG TABS tablet Take 1 tablet (20  mg total) by mouth daily.   No facility-administered encounter medications on file as of 08/01/2022.   BP Readings from Last 3 Encounters:  07/29/22 120/60  06/02/22 (!) 80/60  02/28/22 128/70    Pulse Readings from Last 3 Encounters:  07/29/22 80  06/02/22 93  02/28/22 90    Lab Results  Component Value Date/Time   HGBA1C 5.7 (H) 07/29/2022 09:40 AM   HGBA1C 5.8 (H) 02/28/2022 09:49 AM   Lab Results  Component Value Date   CREATININE 0.85 07/29/2022   BUN 18 07/29/2022   GFRNONAA >60 02/19/2022   GFRAA 77 03/24/2020   NA 134 07/29/2022   K 5.3 (H) 07/29/2022   CALCIUM 9.3 07/29/2022   CO2 19 (L) 07/29/2022  08-01-2022: 1st attempt left vM   [place] [facility] CMA Clinical Pharmacist Assistant [phone]

## 2022-08-07 ENCOUNTER — Encounter: Payer: Self-pay | Admitting: Family Medicine

## 2022-08-08 ENCOUNTER — Other Ambulatory Visit: Payer: Self-pay | Admitting: Family Medicine

## 2022-08-08 DIAGNOSIS — J3089 Other allergic rhinitis: Secondary | ICD-10-CM

## 2022-08-08 MED ORDER — LORATADINE 10 MG PO TABS: 10.0000 mg | ORAL_TABLET | Freq: Every day | ORAL | 3 refills | Status: DC

## 2022-08-15 ENCOUNTER — Telehealth: Payer: Self-pay

## 2022-08-15 NOTE — Telephone Encounter (Signed)
   Hailey Greene has been scheduled for the following appointment:  WHAT: Mammogram/DEXA WHERE: [place] DATE: 7/3 TIME: 8a  Patient has been made aware.

## 2022-08-18 DIAGNOSIS — G4733 Obstructive sleep apnea (adult) (pediatric): Secondary | ICD-10-CM | POA: Diagnosis not present

## 2022-08-22 ENCOUNTER — Other Ambulatory Visit: Payer: Self-pay | Admitting: Family Medicine

## 2022-08-22 DIAGNOSIS — R110 Nausea: Secondary | ICD-10-CM

## 2022-08-30 ENCOUNTER — Telehealth: Payer: Self-pay

## 2022-08-30 ENCOUNTER — Other Ambulatory Visit: Payer: Self-pay

## 2022-08-30 MED ORDER — PHENTERMINE HCL 37.5 MG PO TABS: ORAL_TABLET | ORAL | 0 refills | Status: DC

## 2022-08-30 NOTE — Progress Notes (Signed)
Care Management & Coordination Services Pharmacy Team  Reason for Encounter: Medication coordination and delivery  Contacted patient to discuss medications and coordinate delivery from [facility]. {US HC Outreach:28874} Cycle dispensing form sent to *** for review.  08-30-2022: 1st attempt  Last adherence delivery date: 08-11-2022  Patient is due for next adherence delivery on: 09-09-2022  This delivery to include: Adherence Packaging  30 Days  Rosuvastatin 40 mg at B Omeprazole 20 mg at B Dicyclomine 20 mg- 1 tab every 6 hours  (Vials)        Rybelsus 7 mg at B Budes/formot inhaler 160/4.5-2 puffs twice daily        Loratadine 10 mg at B  Solifenacin 5 mg at B      Zetia 10 mg at B Vitamin B12 2572mcg-1 tab under tongue daily (Vials) Trintellix 20 mg at B   Ramipril 2.5 mg at B Ropinirole 3mg -  1 tab 1-3 hours before bedtime (Vial) Phentermine 15 mg at B Vascepa 1 gm 2 at B 2 EM   Zofran 4 mg- 1 tab every 6 hours PRN (Vial) Amitriptyline 25 mg 1 with evening meals  Lyrica 300 mg 1 at B 1 with EM  Patient declined the following medications this month: ***  Refills requested from providers include: Phentermine   {Delivery date:25786}   Any concerns about your medications? {yes/no:20286}  How often do you forget or accidentally miss a dose? {Missed doses:25554}  Do you use a pillbox? {yes/no:20286}  Is patient in packaging {yes/no:20286}  If yes  What is the date on your next pill pack?  Any concerns or issues with your packaging?   Recent blood pressure readings are as follows:***  Recent blood glucose readings are as follows:***   Chart review: Recent office visits:  None  Recent consult visits:  None  Hospital visits:  None in previous 6 months  Medications: Outpatient Encounter Medications as of 08/30/2022  Medication Sig   amitriptyline (ELAVIL) 25 MG tablet Take 1 tablet (25 mg total) by mouth at bedtime.   ASPIRIN 81 PO Take 81 mg by  mouth daily.   blood glucose meter kit and supplies Dispense based on patient and insurance preference. Use up to four times daily as directed. (FOR ICD-10 E10.9, E11.9).   Blood Glucose Monitoring Suppl (ONETOUCH VERIO) w/Device KIT Use daily to check FBS   budesonide-formoterol (SYMBICORT) 160-4.5 MCG/ACT inhaler Inhale 2 puffs into the lungs 2 (two) times daily.   Cyanocobalamin (B-12) 2500 MCG SUBL place ONE tab UNDER THE TONGUE daily   dicyclomine (BENTYL) 20 MG tablet TAKE ONE TABLET BY MOUTH every SIX hours   Eszopiclone 3 MG TABS TAKE 1 TABLET BY MOUTH AT BEDTIME.   ezetimibe (ZETIA) 10 MG tablet TAKE 1 TABLET BY MOUTH EVERY DAY   fluticasone (FLONASE) 50 MCG/ACT nasal spray SPRAY 2 SPRAYS INTO EACH NOSTRIL EVERY DAY   glucose blood (ONETOUCH VERIO) test strip E11.42 Use new test strip each time when checking FBS   Lancets (ONETOUCH DELICA PLUS LANCET33G) MISC E 11.42 Use new lancet each time when checking FBS   loratadine (CLARITIN) 10 MG tablet Take 1 tablet (10 mg total) by mouth daily.   metoCLOPramide (REGLAN) 10 MG tablet Take 1 tablet (10 mg total) by mouth every 6 (six) hours as needed for nausea.   Multiple Vitamin (MULTIVITAMIN WITH MINERALS) TABS tablet Take 1 tablet by mouth daily.   omeprazole (PRILOSEC) 20 MG capsule TAKE ONE CAPSULE BY MOUTH DAILY   ondansetron (  ZOFRAN) 4 MG tablet TAKE ONE TABLET BY MOUTH every SIX hours AS NEEDED FOR NAUSEA AND VOMITING   phentermine (ADIPEX-P) 37.5 MG tablet Take 1 capsule (37.5 mg total) by mouth every morning.   pregabalin (LYRICA) 300 MG capsule TAKE ONE CAPSULE BY MOUTH TWICE DAILY   promethazine (PHENERGAN) 25 MG tablet Take 1 tablet (25 mg total) by mouth every 6 (six) hours as needed for nausea or vomiting.   ramipril (ALTACE) 2.5 MG capsule Take 1 capsule (2.5 mg total) by mouth daily.   rOPINIRole (REQUIP) 3 MG tablet TAKE 1 TABLET BY MOUTH ONCE DAILY 1-3 HOURS BEFORE  BEDTIME   rosuvastatin (CRESTOR) 40 MG tablet TAKE ONE  TABLET BY MOUTH ONCE DAILY   RYBELSUS 14 MG TABS TAKE ONE TABLET BY MOUTH ONCE DAILY   solifenacin (VESICARE) 5 MG tablet Take 1 tablet (5 mg total) by mouth daily.   topiramate (TOPAMAX) 50 MG tablet Take 1 tablet (50 mg total) by mouth daily.   triamcinolone cream (KENALOG) 0.1 % apply one application TOPICALLY twice daily   VASCEPA 1 g capsule TAKE TWO CAPSULES BY MOUTH TWICE DAILY   vitamin B-12 1000 MCG tablet Take 1 tablet (1,000 mcg total) by mouth daily.   vortioxetine HBr (TRINTELLIX) 20 MG TABS tablet Take 1 tablet (20 mg total) by mouth daily.   No facility-administered encounter medications on file as of 08/30/2022.   BP Readings from Last 3 Encounters:  07/29/22 120/60  06/02/22 (!) 80/60  02/28/22 128/70    Pulse Readings from Last 3 Encounters:  07/29/22 80  06/02/22 93  02/28/22 90    Lab Results  Component Value Date/Time   HGBA1C 5.7 (H) 07/29/2022 09:40 AM   HGBA1C 5.8 (H) 02/28/2022 09:49 AM   Lab Results  Component Value Date   CREATININE 0.85 07/29/2022   BUN 18 07/29/2022   GFRNONAA >60 02/19/2022   GFRAA 77 03/24/2020   NA 134 07/29/2022   K 5.3 (H) 07/29/2022   CALCIUM 9.3 07/29/2022   CO2 19 (L) 07/29/2022     Malecca Hicks CMA Clinical Pharmacist Assistant [phone]

## 2022-09-18 DIAGNOSIS — G4733 Obstructive sleep apnea (adult) (pediatric): Secondary | ICD-10-CM | POA: Diagnosis not present

## 2022-09-27 ENCOUNTER — Other Ambulatory Visit: Payer: Self-pay | Admitting: Family Medicine

## 2022-09-27 DIAGNOSIS — G603 Idiopathic progressive neuropathy: Secondary | ICD-10-CM

## 2022-09-27 DIAGNOSIS — E1142 Type 2 diabetes mellitus with diabetic polyneuropathy: Secondary | ICD-10-CM

## 2022-09-27 DIAGNOSIS — G2581 Restless legs syndrome: Secondary | ICD-10-CM

## 2022-10-04 ENCOUNTER — Other Ambulatory Visit: Payer: Self-pay | Admitting: Family Medicine

## 2022-10-07 DIAGNOSIS — R109 Unspecified abdominal pain: Secondary | ICD-10-CM | POA: Diagnosis not present

## 2022-10-07 DIAGNOSIS — Z7985 Long-term (current) use of injectable non-insulin antidiabetic drugs: Secondary | ICD-10-CM | POA: Diagnosis not present

## 2022-10-07 DIAGNOSIS — Z79899 Other long term (current) drug therapy: Secondary | ICD-10-CM | POA: Diagnosis not present

## 2022-10-07 DIAGNOSIS — I700 Atherosclerosis of aorta: Secondary | ICD-10-CM | POA: Diagnosis not present

## 2022-10-07 DIAGNOSIS — R197 Diarrhea, unspecified: Secondary | ICD-10-CM | POA: Diagnosis not present

## 2022-10-07 DIAGNOSIS — R112 Nausea with vomiting, unspecified: Secondary | ICD-10-CM | POA: Diagnosis not present

## 2022-10-07 DIAGNOSIS — R9431 Abnormal electrocardiogram [ECG] [EKG]: Secondary | ICD-10-CM | POA: Diagnosis not present

## 2022-10-07 DIAGNOSIS — K5730 Diverticulosis of large intestine without perforation or abscess without bleeding: Secondary | ICD-10-CM | POA: Diagnosis not present

## 2022-10-07 DIAGNOSIS — E119 Type 2 diabetes mellitus without complications: Secondary | ICD-10-CM | POA: Diagnosis not present

## 2022-10-07 DIAGNOSIS — K439 Ventral hernia without obstruction or gangrene: Secondary | ICD-10-CM | POA: Diagnosis not present

## 2022-10-09 DIAGNOSIS — Z7984 Long term (current) use of oral hypoglycemic drugs: Secondary | ICD-10-CM | POA: Diagnosis not present

## 2022-10-09 DIAGNOSIS — Z79899 Other long term (current) drug therapy: Secondary | ICD-10-CM | POA: Diagnosis not present

## 2022-10-09 DIAGNOSIS — R110 Nausea: Secondary | ICD-10-CM | POA: Diagnosis not present

## 2022-10-09 DIAGNOSIS — Z20822 Contact with and (suspected) exposure to covid-19: Secondary | ICD-10-CM | POA: Diagnosis not present

## 2022-10-09 DIAGNOSIS — E119 Type 2 diabetes mellitus without complications: Secondary | ICD-10-CM | POA: Diagnosis not present

## 2022-10-09 DIAGNOSIS — R109 Unspecified abdominal pain: Secondary | ICD-10-CM | POA: Diagnosis not present

## 2022-10-09 DIAGNOSIS — R197 Diarrhea, unspecified: Secondary | ICD-10-CM | POA: Diagnosis not present

## 2022-10-10 DIAGNOSIS — R197 Diarrhea, unspecified: Secondary | ICD-10-CM | POA: Diagnosis not present

## 2022-10-10 DIAGNOSIS — R109 Unspecified abdominal pain: Secondary | ICD-10-CM | POA: Diagnosis not present

## 2022-10-18 DIAGNOSIS — G4733 Obstructive sleep apnea (adult) (pediatric): Secondary | ICD-10-CM | POA: Diagnosis not present

## 2022-10-20 ENCOUNTER — Other Ambulatory Visit: Payer: Self-pay | Admitting: Family Medicine

## 2022-10-20 DIAGNOSIS — R110 Nausea: Secondary | ICD-10-CM

## 2022-10-21 ENCOUNTER — Other Ambulatory Visit: Payer: Self-pay | Admitting: Family Medicine

## 2022-10-21 DIAGNOSIS — J301 Allergic rhinitis due to pollen: Secondary | ICD-10-CM

## 2022-10-27 ENCOUNTER — Other Ambulatory Visit: Payer: Self-pay | Admitting: Family Medicine

## 2022-10-27 DIAGNOSIS — K219 Gastro-esophageal reflux disease without esophagitis: Secondary | ICD-10-CM

## 2022-10-29 ENCOUNTER — Other Ambulatory Visit: Payer: Self-pay | Admitting: Family Medicine

## 2022-11-07 ENCOUNTER — Other Ambulatory Visit: Payer: Self-pay | Admitting: Family Medicine

## 2022-11-15 NOTE — Assessment & Plan Note (Addendum)
The current medical regimen is effective;  continue present plan and medications.  Lyrica 300 mg BID

## 2022-11-15 NOTE — Assessment & Plan Note (Addendum)
Well controlled.  Continue Vascepa 2 G twice daily, Rosuvastatin 40 mg daily, and Zetia 10 mg daily.   Recommend continue to work on eating healthy diet and exercise.

## 2022-11-15 NOTE — Assessment & Plan Note (Signed)
The current medical regimen is effective;  continue present plan and medications. Continue vesicare 5 mg daily.  

## 2022-11-15 NOTE — Assessment & Plan Note (Addendum)
Well controlled.  Continue Ramipril 2.5 mg daily.  Healthy diet.

## 2022-11-15 NOTE — Assessment & Plan Note (Addendum)
Control: good.  Compounded by idiopathic neuropathy which she developed prior to being diagnosed diabetes. Recommend check sugars fasting daily. Recommend check feet daily. Recommend annual eye exams. Medicines: Rybelsus 14 mg daily Continue to work on eating a healthy diet and exercise.  Labs drawn today.

## 2022-11-15 NOTE — Progress Notes (Unsigned)
Subjective:  Patient ID: Hailey Greene, female    DOB: 12/13/1950  Age: 72 y.o. MRN: [id number]  Chief Complaint  Patient presents with   Medical Management of Chronic Issues    HPI Diabetes:  Complications: hypertension, neuropathy. Glucose checking: Daily Glucose logs:110-122 Hypoglycemia: No Most recent A1C: 5.7% Current medications: Rybelsus 14 mg daily. Last Eye Exam: 08/05/2021 Foot checks:Daily  Asthma: controlled with symbicort 2 puffs twice daily,   Hyperlipidemia: Current medications: Vascepa 2g twice a day, Rosuvastatin 40 mg daily, zetia 10 mg daily  Hypertension: Current medications: Ramipril 2.5 mg daily.  Depression: Taking Trintellix 20 mg daily.  GERD: Takes Omeprazole 20 mg daily IBS: on bentyl: takes twice daily usually  Rest legs syndrome: Ropinirole 3 mg at bedtime.  Incontinence urinary: Taking Vesicare 5 mg daily.     11/16/2022    8:58 AM 07/29/2022    9:16 AM 03/02/2022    3:45 PM 12/29/2021    1:16 PM 11/23/2021    9:30 AM  Depression screen PHQ 2/9  Decreased Interest 0 0 0 0 0  Down, Depressed, Hopeless 1 1 0 0 0  PHQ - 2 Score 1 1 0 0 0  Altered sleeping 2 0 0  0  Tired, decreased energy 2 0 1  0  Change in appetite 2 0 0  0  Feeling bad or failure about yourself  0 0 0  0  Trouble concentrating 0 0 0  0  Moving slowly or fidgety/restless 2 0   0  Suicidal thoughts 0 0 0  0  PHQ-9 Score 9 1 1   0  Difficult doing work/chores Somewhat difficult Not difficult at all Not difficult at all  Not difficult at all        11/16/2022    8:57 AM  Fall Risk   Falls in the past year? 1  Number falls in past yr: 1  Injury with Fall? 1  Risk for fall due to : Impaired balance/gait  Follow up Falls evaluation completed;Falls prevention discussed      11/16/2022    8:58 AM 07/29/2022    9:16 AM  GAD 7 : Generalized Anxiety Score  Nervous, Anxious, on Edge 3 1  Control/stop worrying 2 0  Worry too much - different things 2 0  Trouble  relaxing 1 0  Restless 1 1  Easily annoyed or irritable 0 0  Afraid - awful might happen 0 1  Total GAD 7 Score 9 3  Anxiety Difficulty Very difficult Somewhat difficult      Patient Care Team: Blane Ohara, MD as PCP - General (Family Medicine) Dannielle Huh, MD as Consulting Physician (Orthopedic Surgery)   Review of Systems  Constitutional:  Positive for fatigue. Negative for chills and fever.  HENT:  Positive for rhinorrhea. Negative for congestion and sore throat.   Respiratory:  Negative for cough and shortness of breath.   Cardiovascular:  Negative for chest pain.  Gastrointestinal:  Negative for abdominal pain, constipation, diarrhea, nausea and vomiting.  Genitourinary:  Negative for dysuria and urgency.  Musculoskeletal:  Positive for arthralgias (right hip pain) and back pain. Negative for myalgias.  Neurological:  Positive for dizziness. Negative for weakness, light-headedness and headaches.  Psychiatric/Behavioral:  Positive for dysphoric mood and sleep disturbance. The patient is nervous/anxious.     Current Outpatient Medications on File Prior to Visit  Medication Sig Dispense Refill   ASPIRIN 81 PO Take 81 mg by mouth daily.     blood  glucose meter kit and supplies Dispense based on patient and insurance preference. Use up to four times daily as directed. (FOR ICD-10 E10.9, E11.9). 1 each 0   Blood Glucose Monitoring Suppl (ONETOUCH VERIO) w/Device KIT Use daily to check FBS 1 kit 0   Cyanocobalamin (B-12) 2500 MCG SUBL place ONE tab UNDER THE TONGUE daily 90 tablet 1   glucose blood (ONETOUCH VERIO) test strip E11.42 Use new test strip each time when checking FBS 100 each 2   Lancets (ONETOUCH DELICA PLUS LANCET33G) MISC E 11.42 Use new lancet each time when checking FBS 100 each 2   Multiple Vitamin (MULTIVITAMIN WITH MINERALS) TABS tablet Take 1 tablet by mouth daily.     ondansetron (ZOFRAN) 4 MG tablet TAKE ONE TABLET BY MOUTH EVERY 6 HOURS AS NEEDED FOR NAUSEA  AND VOMITING 90 tablet 1   promethazine (PHENERGAN) 25 MG tablet Take 1 tablet (25 mg total) by mouth every 6 (six) hours as needed for nausea or vomiting. 30 tablet 0   triamcinolone cream (KENALOG) 0.1 % apply one application TOPICALLY twice daily 80 g 3   vitamin B-12 1000 MCG tablet Take 1 tablet (1,000 mcg total) by mouth daily. 30 tablet 0   No current facility-administered medications on file prior to visit.   Past Medical History:  Diagnosis Date   2019 novel coronavirus disease (COVID-19) 12/31/2018   Acute hypoxemic respiratory failure due to COVID-19 (HCC)    Chicken pox    Depression    Diabetes (HCC)    GERD (gastroesophageal reflux disease)    Hypertension    Idiopathic progressive neuropathy    Major depressive disorder    Mixed hyperlipidemia    Neuropathy    Osteoarthritis    Pneumonia    Primary insomnia    Primary osteoarthritis of left knee 05/31/2019   RLS (restless legs syndrome)    S/P total knee replacement 07/15/2019   Status post total knee replacement 07/25/2019   Tachycardia    Urge incontinence    UTI (lower urinary tract infection)    Weakness    Past Surgical History:  Procedure Laterality Date   ABDOMINAL HYSTERECTOMY     APPENDECTOMY     CHOLECYSTECTOMY     HERNIA REPAIR     REPLACEMENT TOTAL KNEE Right    TONSILLECTOMY     TOTAL KNEE ARTHROPLASTY Left 07/15/2019   Procedure: TOTAL KNEE ARTHROPLASTY;  Surgeon: Dannielle Huh, MD;  Location: [facility];  Service: Orthopedics;  Laterality: Left;    Family History  Problem Relation Age of Onset   Thyroid disease Mother    Cancer Mother    Migraines Mother    Brain cancer Father    Heart disease Maternal Grandmother    Diabetes Daughter    Social History   Socioeconomic History   Marital status: Married    Spouse name: Not on file   Number of children: 3   Years of education: 12   Highest education level: Not on file  Occupational History   Occupation: Housewife  Tobacco Use   Smoking  status: Former    Current packs/day: 0.00    Average packs/day: 2.0 packs/day for 12.0 years (24.0 ttl pk-yrs)    Types: Cigarettes    Start date: 05/25/1998    Quit date: 05/25/2010    Years since quitting: 12.4   Smokeless tobacco: Never  Vaping Use   Vaping status: Never Used  Substance and Sexual Activity   Alcohol use: Yes  Comment: occasional   Drug use: No   Sexual activity: Not on file  Other Topics Concern   Not on file  Social History Narrative   Born and raised in [place], [place]. Currently lives in a house with her husband. 1 dog. Fun: Garden, feed birds, swimming   Denies any religious beliefs effecting health care.    Social Determinants of Health   Financial Resource Strain: High Risk (02/12/2020)   Overall Financial Resource Strain (CARDIA)    Difficulty of Paying Living Expenses: Hard  Food Insecurity: No Food Insecurity (12/29/2021)   Hunger Vital Sign    Worried About Running Out of Food in the Last Year: Never true    Ran Out of Food in the Last Year: Never true  Transportation Needs: No Transportation Needs (12/29/2021)   PRAPARE - Administrator, Civil Service (Medical): No    Lack of Transportation (Non-Medical): No  Physical Activity: Insufficiently Active (02/12/2020)   Exercise Vital Sign    Days of Exercise per Week: 7 days    Minutes of Exercise per Session: 20 min  Stress: Not on file  Social Connections: Not on file    Objective:  BP 110/60   Pulse 84   Temp (!) 96.7 F (35.9 C)   Resp 16   Ht 5\' 3"  (1.6 m)   Wt 192 lb 12.8 oz (87.5 kg)   BMI 34.15 kg/m      11/16/2022    8:47 AM 07/29/2022    9:12 AM 06/02/2022    4:05 PM  BP/Weight  Systolic BP 110 120 80  Diastolic BP 60 60 60  Wt. (Lbs) 192.8 193 180  BMI 34.15 kg/m2 34.19 kg/m2 31.89 kg/m2    Physical Exam Vitals reviewed.  Constitutional:      Appearance: Normal appearance. She is obese.  Neck:     Vascular: No carotid bruit.  Cardiovascular:     Rate and  Rhythm: Normal rate and regular rhythm.     Heart sounds: Normal heart sounds.  Pulmonary:     Effort: Pulmonary effort is normal. No respiratory distress.     Breath sounds: Normal breath sounds.  Abdominal:     General: Abdomen is flat. Bowel sounds are normal.     Palpations: Abdomen is soft.     Tenderness: There is no abdominal tenderness.  Musculoskeletal:     Comments: Tinel's and Phalen's test on bilateral wrists.  Neurological:     Mental Status: She is alert and oriented to person, place, and time.  Psychiatric:        Mood and Affect: Mood normal.        Behavior: Behavior normal.     Diabetic Foot Exam - Simple   Simple Foot Form Diabetic Foot exam was performed with the following findings: Yes 11/16/2022  9:50 AM  Visual Inspection No deformities, no ulcerations, no other skin breakdown bilaterally: Yes Sensation Testing See comments: Yes Pulse Check Posterior Tibialis and Dorsalis pulse intact bilaterally: Yes Comments Decreased sensation bilaterally      Lab Results  Component Value Date   WBC 6.6 11/16/2022   HGB 13.8 11/16/2022   HCT 42.0 11/16/2022   PLT 232 11/16/2022   GLUCOSE 96 11/16/2022   CHOL 252 (H) 11/16/2022   TRIG 201 (H) 11/16/2022   HDL 52 11/16/2022   LDLCALC 163 (H) 11/16/2022   ALT 13 11/16/2022   AST 13 11/16/2022   NA 135 11/16/2022   K 5.1 11/16/2022  CL 98 11/16/2022   CREATININE 0.80 11/16/2022   BUN 10 11/16/2022   CO2 24 11/16/2022   TSH 2.690 11/16/2022   HGBA1C 6.0 (H) 11/16/2022   MICROALBUR 30 09/25/2020      Assessment & Plan:    Hypertension associated with diabetes (HCC) Assessment & Plan: Continue Ramipril 2.5 mg daily.  Healthy diet.  Orders: -     CBC with Differential/Platelet -     Comprehensive metabolic panel  Diabetic polyneuropathy associated with type 2 diabetes mellitus (HCC) Assessment & Plan: Control:  Recommend check sugars fasting daily. Recommend check feet daily. Recommend  annual eye exams. Medicines: Rybelsus 14 mg daily Continue to work on eating a healthy diet and exercise.  Labs drawn today.     Orders: -     Hemoglobin A1c -     Pregabalin; Take 1 capsule (300 mg total) by mouth 2 (two) times daily.  Dispense: 60 capsule; Refill: 2  Idiopathic progressive neuropathy Assessment & Plan: The current medical regimen is effective;  continue present plan and medications.  Lyrica 300 mg BID  Orders: -     Pregabalin; Take 1 capsule (300 mg total) by mouth 2 (two) times daily.  Dispense: 60 capsule; Refill: 2  Mixed hyperlipidemia Assessment & Plan: Well controlled.  Continue Vascepa 2 G twice daily. Rosuvastatin 40 mg daily. Zetia 10 mg daily.   Recommend continue to work on eating healthy diet and exercise.   Orders: -     Lipid panel  Urge incontinence Assessment & Plan: The current medical regimen is effective;  continue present plan and medications. Continue vesicare 5 mg daily.    RLS (restless legs syndrome) Assessment & Plan: The current medical regimen is effective;  continue present plan and medications.   Orders: -     Amitriptyline HCl; Take 1 tablet (25 mg total) by mouth every evening.  Dispense: 30 tablet; Refill: 2  Mild intermittent asthma without complication Assessment & Plan: Well controlled on symbicort.   Orders: -     Budesonide-Formoterol Fumarate; Inhale 2 puffs into the lungs 2 (two) times daily.  Dispense: 10.2 g; Refill: 3  Gastroesophageal reflux disease, unspecified whether esophagitis present Assessment & Plan: The current medical regimen is effective;  continue present plan and medications.  Omeprazole 20 mg daily  Orders: -     Omeprazole; Take 1 capsule (20 mg total) by mouth daily.  Dispense: 90 capsule; Refill: 2  Non-seasonal allergic rhinitis due to pollen Assessment & Plan: The current medical regimen is effective;  continue present plan and medications.  Flonase nasal spray  Orders: -      Fluticasone Propionate; SPRAY 2 SPRAYS INTO EACH NOSTRIL EVERY DAY  Dispense: 48 g; Refill: 2  Non-seasonal allergic rhinitis due to other allergic trigger Assessment & Plan: The current medical regimen is effective;  continue present plan and medications.   Orders: -     Loratadine; Take 1 tablet (10 mg total) by mouth daily.  Dispense: 90 tablet; Refill: 3  Paresthesia Assessment & Plan: Check labs  Orders: -     B12 and Folate Panel -     Methylmalonic acid, serum -     TSH  Other orders -     Dicyclomine HCl; Take 1 tablet (20 mg total) by mouth 4 (four) times daily as needed (intestinal spasms).  Dispense: 360 tablet; Refill: 1 -     Ezetimibe; Take 1 tablet (10 mg total) by mouth daily.  Dispense: 90 tablet;  Refill: 0 -     Ramipril; Take 1 capsule (2.5 mg total) by mouth daily.  Dispense: 90 capsule; Refill: 1 -     rOPINIRole HCl; Take 1 tablet (3 mg total) by mouth daily 1-3 hours before bedtime.  Dispense: 90 tablet; Refill: 1 -     Rosuvastatin Calcium; Take 1 tablet (40 mg total) by mouth daily.  Dispense: 90 tablet; Refill: 1 -     Rybelsus; Take 1 tablet (14 mg total) by mouth daily.  Dispense: 90 tablet; Refill: 1 -     Solifenacin Succinate; Take 1 tablet (5 mg total) by mouth daily.  Dispense: 90 tablet; Refill: 1 -     Topiramate; Take 1 tablet (50 mg total) by mouth daily.  Dispense: 90 tablet; Refill: 0 -     Vortioxetine HBr; Take 1 tablet (20 mg total) by mouth daily.  Dispense: 90 tablet; Refill: 1 -     Icosapent Ethyl; Take 2 capsules (2 g total) by mouth 2 (two) times daily.  Dispense: 360 capsule; Refill: 1 -     Phentermine HCl; Take 1 tablet (37.5 mg total) by mouth every morning.  Dispense: 30 tablet; Refill: 2 -     Eszopiclone; Take 1 tablet (3 mg total) by mouth at bedtime. Take immediately before bedtime  Dispense: 30 tablet; Refill: 2 -     Metoclopramide HCl; Take 1 tablet (10 mg total) by mouth every 6 (six) hours as needed for nausea.  Dispense:  15 tablet; Refill: 0     Meds ordered this encounter  Medications   amitriptyline (ELAVIL) 25 MG tablet    Sig: Take 1 tablet (25 mg total) by mouth every evening.    Dispense:  30 tablet    Refill:  2    This prescription was filled on 09/07/2022. Any refills authorized will be placed on file.   budesonide-formoterol (SYMBICORT) 160-4.5 MCG/ACT inhaler    Sig: Inhale 2 puffs into the lungs 2 (two) times daily.    Dispense:  10.2 g    Refill:  3   dicyclomine (BENTYL) 20 MG tablet    Sig: Take 1 tablet (20 mg total) by mouth 4 (four) times daily as needed (intestinal spasms).    Dispense:  360 tablet    Refill:  1    This prescription was filled on 10/07/2022. Any refills authorized will be placed on file.   ezetimibe (ZETIA) 10 MG tablet    Sig: Take 1 tablet (10 mg total) by mouth daily.    Dispense:  90 tablet    Refill:  0   omeprazole (PRILOSEC) 20 MG capsule    Sig: Take 1 capsule (20 mg total) by mouth daily.    Dispense:  90 capsule    Refill:  2   pregabalin (LYRICA) 300 MG capsule    Sig: Take 1 capsule (300 mg total) by mouth 2 (two) times daily.    Dispense:  60 capsule    Refill:  2    This prescription was filled on 09/07/2022. Any refills authorized will be placed on file.   ramipril (ALTACE) 2.5 MG capsule    Sig: Take 1 capsule (2.5 mg total) by mouth daily.    Dispense:  90 capsule    Refill:  1    [facility]. Patient needs some meds this week and would like them delivered.   rOPINIRole (REQUIP) 3 MG tablet    Sig: Take 1 tablet (  3 mg total) by mouth daily 1-3 hours before bedtime.    Dispense:  90 tablet    Refill:  1    This prescription was filled on 10/07/2022. Any refills authorized will be placed on file.   rosuvastatin (CRESTOR) 40 MG tablet    Sig: Take 1 tablet (40 mg total) by mouth daily.    Dispense:  90 tablet    Refill:  1   Semaglutide (RYBELSUS) 14 MG TABS    Sig: Take 1 tablet (14 mg total) by mouth  daily.    Dispense:  90 tablet    Refill:  1   solifenacin (VESICARE) 5 MG tablet    Sig: Take 1 tablet (5 mg total) by mouth daily.    Dispense:  90 tablet    Refill:  1    This prescription was filled on 10/07/2022. Any refills authorized will be placed on file.   topiramate (TOPAMAX) 50 MG tablet    Sig: Take 1 tablet (50 mg total) by mouth daily.    Dispense:  90 tablet    Refill:  0   vortioxetine HBr (TRINTELLIX) 20 MG TABS tablet    Sig: Take 1 tablet (20 mg total) by mouth daily.    Dispense:  90 tablet    Refill:  1    This prescription was filled on 10/07/2022. Any refills authorized will be placed on file.   icosapent Ethyl (VASCEPA) 1 g capsule    Sig: Take 2 capsules (2 g total) by mouth 2 (two) times daily.    Dispense:  360 capsule    Refill:  1   phentermine (ADIPEX-P) 37.5 MG tablet    Sig: Take 1 tablet (37.5 mg total) by mouth every morning.    Dispense:  30 tablet    Refill:  2   Eszopiclone 3 MG TABS    Sig: Take 1 tablet (3 mg total) by mouth at bedtime. Take immediately before bedtime    Dispense:  30 tablet    Refill:  2   fluticasone (FLONASE) 50 MCG/ACT nasal spray    Sig: SPRAY 2 SPRAYS INTO EACH NOSTRIL EVERY DAY    Dispense:  48 g    Refill:  2   loratadine (CLARITIN) 10 MG tablet    Sig: Take 1 tablet (10 mg total) by mouth daily.    Dispense:  90 tablet    Refill:  3   metoCLOPramide (REGLAN) 10 MG tablet    Sig: Take 1 tablet (10 mg total) by mouth every 6 (six) hours as needed for nausea.    Dispense:  15 tablet    Refill:  0    Orders Placed This Encounter  Procedures   CBC with Differential/Platelet   Comprehensive metabolic panel   Hemoglobin A1c   Lipid panel   B12 and Folate Panel   Methylmalonic acid, serum   TSH     Follow-up: Return in about 3 months (around 02/16/2023) for chronic follow up.   I,Marla I Leal-Borjas,acting as a scribe for Blane Ohara, MD.,have documented all relevant documentation on the behalf of Blane Ohara, MD,as directed by  Blane Ohara, MD while in the presence of Blane Ohara, MD.   An After Visit Summary was printed and given to the patient.  Blane Ohara, MD [facility] [phone]

## 2022-11-16 ENCOUNTER — Other Ambulatory Visit: Payer: Self-pay

## 2022-11-16 ENCOUNTER — Other Ambulatory Visit (HOSPITAL_COMMUNITY): Payer: Self-pay

## 2022-11-16 ENCOUNTER — Ambulatory Visit: Payer: 59 | Admitting: Family Medicine

## 2022-11-16 ENCOUNTER — Encounter: Payer: Self-pay | Admitting: Family Medicine

## 2022-11-16 VITALS — BP 110/60 | HR 84 | Temp 96.7°F | Resp 16 | Ht 63.0 in | Wt 192.8 lb

## 2022-11-16 DIAGNOSIS — Z6834 Body mass index (BMI) 34.0-34.9, adult: Secondary | ICD-10-CM

## 2022-11-16 DIAGNOSIS — N3941 Urge incontinence: Secondary | ICD-10-CM

## 2022-11-16 DIAGNOSIS — E782 Mixed hyperlipidemia: Secondary | ICD-10-CM

## 2022-11-16 DIAGNOSIS — K219 Gastro-esophageal reflux disease without esophagitis: Secondary | ICD-10-CM | POA: Diagnosis not present

## 2022-11-16 DIAGNOSIS — G603 Idiopathic progressive neuropathy: Secondary | ICD-10-CM

## 2022-11-16 DIAGNOSIS — J4520 Mild intermittent asthma, uncomplicated: Secondary | ICD-10-CM

## 2022-11-16 DIAGNOSIS — I152 Hypertension secondary to endocrine disorders: Secondary | ICD-10-CM

## 2022-11-16 DIAGNOSIS — G2581 Restless legs syndrome: Secondary | ICD-10-CM

## 2022-11-16 DIAGNOSIS — E1142 Type 2 diabetes mellitus with diabetic polyneuropathy: Secondary | ICD-10-CM

## 2022-11-16 DIAGNOSIS — J301 Allergic rhinitis due to pollen: Secondary | ICD-10-CM | POA: Diagnosis not present

## 2022-11-16 DIAGNOSIS — E1159 Type 2 diabetes mellitus with other circulatory complications: Secondary | ICD-10-CM | POA: Diagnosis not present

## 2022-11-16 DIAGNOSIS — R202 Paresthesia of skin: Secondary | ICD-10-CM

## 2022-11-16 DIAGNOSIS — G5600 Carpal tunnel syndrome, unspecified upper limb: Secondary | ICD-10-CM

## 2022-11-16 DIAGNOSIS — E6609 Other obesity due to excess calories: Secondary | ICD-10-CM

## 2022-11-16 DIAGNOSIS — I10 Essential (primary) hypertension: Secondary | ICD-10-CM | POA: Diagnosis not present

## 2022-11-16 DIAGNOSIS — J3089 Other allergic rhinitis: Secondary | ICD-10-CM

## 2022-11-16 MED ORDER — VORTIOXETINE HBR 20 MG PO TABS
20.0000 mg | ORAL_TABLET | Freq: Every day | ORAL | 1 refills | Status: DC
  Filled 2022-11-16: qty 90, 90d supply, fill #0

## 2022-11-16 MED ORDER — EZETIMIBE 10 MG PO TABS
10.0000 mg | ORAL_TABLET | Freq: Every day | ORAL | 0 refills | Status: DC
Start: 2022-11-16 — End: 2022-11-22
  Filled 2022-11-16: qty 90, 90d supply, fill #0

## 2022-11-16 MED ORDER — PREGABALIN 300 MG PO CAPS
300.0000 mg | ORAL_CAPSULE | Freq: Two times a day (BID) | ORAL | 2 refills | Status: DC
Start: 2022-11-16 — End: 2022-12-19
  Filled 2022-11-16: qty 60, 30d supply, fill #0

## 2022-11-16 MED ORDER — BUDESONIDE-FORMOTEROL FUMARATE 160-4.5 MCG/ACT IN AERO
2.0000 | INHALATION_SPRAY | Freq: Two times a day (BID) | RESPIRATORY_TRACT | 3 refills | Status: DC
  Filled 2022-11-16: qty 10.2, 30d supply, fill #0

## 2022-11-16 MED ORDER — FLUTICASONE PROPIONATE 50 MCG/ACT NA SUSP
NASAL | 2 refills | Status: DC
Start: 2022-11-16 — End: 2023-03-08
  Filled 2022-11-16: qty 48, 90d supply, fill #0

## 2022-11-16 MED ORDER — SOLIFENACIN SUCCINATE 5 MG PO TABS
5.0000 mg | ORAL_TABLET | Freq: Every day | ORAL | 1 refills | Status: DC
  Filled 2022-11-16: qty 90, 90d supply, fill #0

## 2022-11-16 MED ORDER — RAMIPRIL 2.5 MG PO CAPS
2.5000 mg | ORAL_CAPSULE | Freq: Every day | ORAL | 1 refills | Status: DC
Start: 2022-11-16 — End: 2023-03-08
  Filled 2022-11-16: qty 90, 90d supply, fill #0

## 2022-11-16 MED ORDER — METOCLOPRAMIDE HCL 10 MG PO TABS
10.0000 mg | ORAL_TABLET | Freq: Four times a day (QID) | ORAL | 0 refills | Status: DC | PRN
  Filled 2022-11-16 (×2): qty 15, 4d supply, fill #0

## 2022-11-16 MED ORDER — DICYCLOMINE HCL 20 MG PO TABS
20.0000 mg | ORAL_TABLET | Freq: Four times a day (QID) | ORAL | 1 refills | Status: DC | PRN
  Filled 2022-11-16: qty 360, 90d supply, fill #0

## 2022-11-16 MED ORDER — ROPINIROLE HCL 3 MG PO TABS
3.0000 mg | ORAL_TABLET | Freq: Every day | ORAL | 1 refills | Status: DC
  Filled 2022-11-16: qty 90, 90d supply, fill #0

## 2022-11-16 MED ORDER — RYBELSUS 14 MG PO TABS
1.0000 | ORAL_TABLET | Freq: Every day | ORAL | 1 refills | Status: DC
Start: 2022-11-16 — End: 2023-03-08
  Filled 2022-11-16: qty 90, 90d supply, fill #0

## 2022-11-16 MED ORDER — AMITRIPTYLINE HCL 25 MG PO TABS
25.0000 mg | ORAL_TABLET | Freq: Every evening | ORAL | 2 refills | Status: DC
  Filled 2022-11-16: qty 30, 30d supply, fill #0

## 2022-11-16 MED ORDER — OMEPRAZOLE 20 MG PO CPDR
20.0000 mg | DELAYED_RELEASE_CAPSULE | Freq: Every day | ORAL | 2 refills | Status: DC
Start: 2022-11-16 — End: 2023-03-08
  Filled 2022-11-16: qty 90, 90d supply, fill #0

## 2022-11-16 MED ORDER — ESZOPICLONE 3 MG PO TABS
3.0000 mg | ORAL_TABLET | Freq: Every day | ORAL | 2 refills | Status: DC
  Filled 2022-11-16: qty 30, 30d supply, fill #0

## 2022-11-16 MED ORDER — PHENTERMINE HCL 37.5 MG PO TABS
37.5000 mg | ORAL_TABLET | Freq: Every morning | ORAL | 2 refills | Status: DC
Start: 2022-11-16 — End: 2023-03-08
  Filled 2022-11-16 (×2): qty 30, 30d supply, fill #0

## 2022-11-16 MED ORDER — TOPIRAMATE 50 MG PO TABS
50.0000 mg | ORAL_TABLET | Freq: Every day | ORAL | 0 refills | Status: DC
  Filled 2022-11-16: qty 90, 90d supply, fill #0

## 2022-11-16 MED ORDER — ICOSAPENT ETHYL 1 G PO CAPS
2.0000 g | ORAL_CAPSULE | Freq: Two times a day (BID) | ORAL | 1 refills | Status: DC
  Filled 2022-11-16: qty 360, 90d supply, fill #0

## 2022-11-16 MED ORDER — LORATADINE 10 MG PO TABS
10.0000 mg | ORAL_TABLET | Freq: Every day | ORAL | 3 refills | Status: DC
Start: 2022-11-16 — End: 2023-03-08
  Filled 2022-11-16: qty 90, 90d supply, fill #0

## 2022-11-16 MED ORDER — ROSUVASTATIN CALCIUM 40 MG PO TABS
40.0000 mg | ORAL_TABLET | Freq: Every day | ORAL | 1 refills | Status: DC
  Filled 2022-11-16: qty 90, 90d supply, fill #0

## 2022-11-16 NOTE — Patient Instructions (Addendum)
Sending medicine to [facility] long outpatient pharmacy [phone].) I would call them today.. Start naproxen 500 mg twice daily x 14 days for carpal tunnel.

## 2022-11-17 LAB — CBC WITH DIFFERENTIAL/PLATELET
Basophils Absolute: 0 10*3/uL (ref 0.0–0.2)
Basos: 1 %
EOS (ABSOLUTE): 0.1 10*3/uL (ref 0.0–0.4)
Eos: 1 %
Hematocrit: 42 % (ref 34.0–46.6)
Hemoglobin: 13.8 g/dL (ref 11.1–15.9)
Immature Grans (Abs): 0 10*3/uL (ref 0.0–0.1)
Immature Granulocytes: 1 %
Lymphocytes Absolute: 1.8 10*3/uL (ref 0.7–3.1)
Lymphs: 26 %
MCH: 29.4 pg (ref 26.6–33.0)
MCHC: 32.9 g/dL (ref 31.5–35.7)
MCV: 90 fL (ref 79–97)
Monocytes Absolute: 0.6 10*3/uL (ref 0.1–0.9)
Monocytes: 9 %
Neutrophils Absolute: 4.1 10*3/uL (ref 1.4–7.0)
Neutrophils: 62 %
Platelets: 232 10*3/uL (ref 150–450)
RBC: 4.69 x10E6/uL (ref 3.77–5.28)
RDW: 12.8 % (ref 11.7–15.4)
WBC: 6.6 10*3/uL (ref 3.4–10.8)

## 2022-11-17 LAB — HEMOGLOBIN A1C
Est. average glucose Bld gHb Est-mCnc: 126 mg/dL
Hgb A1c MFr Bld: 6 % — ABNORMAL HIGH (ref 4.8–5.6)

## 2022-11-17 LAB — LIPID PANEL
Chol/HDL Ratio: 4.8 ratio — ABNORMAL HIGH (ref 0.0–4.4)
Cholesterol, Total: 252 mg/dL — ABNORMAL HIGH (ref 100–199)
HDL: 52 mg/dL (ref >39)
LDL Chol Calc (NIH): 163 mg/dL — ABNORMAL HIGH (ref 0–99)
Triglycerides: 201 mg/dL — ABNORMAL HIGH (ref 0–149)
VLDL Cholesterol Cal: 37 mg/dL (ref 5–40)

## 2022-11-17 LAB — COMPREHENSIVE METABOLIC PANEL
ALT: 13 IU/L (ref 0–32)
AST: 13 IU/L (ref 0–40)
Albumin: 4 g/dL (ref 3.8–4.8)
Alkaline Phosphatase: 89 IU/L (ref 44–121)
BUN/Creatinine Ratio: 13 (ref 12–28)
BUN: 10 mg/dL (ref 8–27)
Bilirubin Total: 0.4 mg/dL (ref 0.0–1.2)
CO2: 24 mmol/L (ref 20–29)
Calcium: 9.5 mg/dL (ref 8.7–10.3)
Chloride: 98 mmol/L (ref 96–106)
Creatinine, Ser: 0.8 mg/dL (ref 0.57–1.00)
Globulin, Total: 2.3 g/dL (ref 1.5–4.5)
Glucose: 96 mg/dL (ref 70–99)
Potassium: 5.1 mmol/L (ref 3.5–5.2)
Sodium: 135 mmol/L (ref 134–144)
Total Protein: 6.3 g/dL (ref 6.0–8.5)
eGFR: 79 mL/min/{1.73_m2} (ref >59)

## 2022-11-18 DIAGNOSIS — G4733 Obstructive sleep apnea (adult) (pediatric): Secondary | ICD-10-CM | POA: Diagnosis not present

## 2022-11-19 DIAGNOSIS — R202 Paresthesia of skin: Secondary | ICD-10-CM | POA: Insufficient documentation

## 2022-11-19 DIAGNOSIS — K219 Gastro-esophageal reflux disease without esophagitis: Secondary | ICD-10-CM | POA: Insufficient documentation

## 2022-11-19 DIAGNOSIS — J309 Allergic rhinitis, unspecified: Secondary | ICD-10-CM | POA: Insufficient documentation

## 2022-11-19 DIAGNOSIS — J301 Allergic rhinitis due to pollen: Secondary | ICD-10-CM | POA: Insufficient documentation

## 2022-11-19 DIAGNOSIS — G5600 Carpal tunnel syndrome, unspecified upper limb: Secondary | ICD-10-CM | POA: Insufficient documentation

## 2022-11-19 LAB — METHYLMALONIC ACID, SERUM: Methylmalonic Acid: 286 nmol/L (ref 0–378)

## 2022-11-19 LAB — B12 AND FOLATE PANEL
Folate: 15.3 ng/mL (ref >3.0)
Vitamin B-12: 906 pg/mL (ref 232–1245)

## 2022-11-19 LAB — TSH: TSH: 2.69 u[IU]/mL (ref 0.450–4.500)

## 2022-11-19 NOTE — Assessment & Plan Note (Signed)
Check labs 

## 2022-11-19 NOTE — Assessment & Plan Note (Signed)
The current medical regimen is effective; continue present plan and medications. Omeprazole 20 mg daily. 

## 2022-11-19 NOTE — Assessment & Plan Note (Signed)
Start naproxen 500 mg twice daily x 14 days for carpal tunnel. Recommend carpal tunnel braces.

## 2022-11-19 NOTE — Assessment & Plan Note (Signed)
The current medical regimen is effective;  continue present plan and medications.  

## 2022-11-19 NOTE — Assessment & Plan Note (Signed)
Well controlled on symbicort. 

## 2022-11-19 NOTE — Assessment & Plan Note (Signed)
The current medical regimen is effective;  continue present plan and medications.  Flonase nasal spray

## 2022-11-20 ENCOUNTER — Encounter: Payer: Self-pay | Admitting: Family Medicine

## 2022-11-20 DIAGNOSIS — E6609 Other obesity due to excess calories: Secondary | ICD-10-CM | POA: Insufficient documentation

## 2022-11-20 NOTE — Assessment & Plan Note (Signed)
Recommend continue to work on eating healthy diet and exercise.  °Comorbidities: diabetes, hyperlipidemia °

## 2022-11-22 ENCOUNTER — Other Ambulatory Visit: Payer: Self-pay | Admitting: Family Medicine

## 2022-11-22 DIAGNOSIS — E782 Mixed hyperlipidemia: Secondary | ICD-10-CM

## 2022-11-23 ENCOUNTER — Other Ambulatory Visit: Payer: Self-pay | Admitting: Family Medicine

## 2022-12-06 ENCOUNTER — Telehealth: Payer: Self-pay

## 2022-12-06 NOTE — Telephone Encounter (Signed)
Hailey Greene called to report that she got a call from the orthopedist and they received a copy of her lab results.  I do not see that the labs were faxed.  She is going to call them back.  +

## 2022-12-07 ENCOUNTER — Other Ambulatory Visit (HOSPITAL_COMMUNITY): Payer: Self-pay

## 2022-12-13 ENCOUNTER — Other Ambulatory Visit (HOSPITAL_COMMUNITY): Payer: Self-pay

## 2022-12-16 ENCOUNTER — Other Ambulatory Visit: Payer: Self-pay | Admitting: Family Medicine

## 2022-12-16 ENCOUNTER — Other Ambulatory Visit: Payer: Self-pay | Admitting: Physician Assistant

## 2022-12-16 DIAGNOSIS — E1142 Type 2 diabetes mellitus with diabetic polyneuropathy: Secondary | ICD-10-CM

## 2022-12-16 DIAGNOSIS — L2089 Other atopic dermatitis: Secondary | ICD-10-CM

## 2022-12-16 DIAGNOSIS — R110 Nausea: Secondary | ICD-10-CM

## 2022-12-21 ENCOUNTER — Other Ambulatory Visit: Payer: Self-pay | Admitting: Family Medicine

## 2023-01-02 ENCOUNTER — Telehealth: Payer: Self-pay | Admitting: Family Medicine

## 2023-01-02 ENCOUNTER — Ambulatory Visit (INDEPENDENT_AMBULATORY_CARE_PROVIDER_SITE_OTHER): Payer: Medicare HMO

## 2023-01-02 DIAGNOSIS — Z23 Encounter for immunization: Secondary | ICD-10-CM

## 2023-01-02 NOTE — Telephone Encounter (Signed)
   Hailey Greene has been scheduled for the following appointment:  WHAT: BONE DENSITY WHERE: [facility] DATE: 01/26/2023 TIME: 10:00 AM CHECK-IN  Patient has been made aware.

## 2023-01-02 NOTE — Progress Notes (Signed)
Patient tolerate vaccines well.

## 2023-01-03 ENCOUNTER — Other Ambulatory Visit (HOSPITAL_COMMUNITY): Payer: Self-pay

## 2023-01-05 ENCOUNTER — Other Ambulatory Visit (HOSPITAL_COMMUNITY): Payer: Self-pay

## 2023-01-05 ENCOUNTER — Other Ambulatory Visit: Payer: Self-pay

## 2023-01-05 MED ORDER — LORATADINE 10 MG PO TABS
10.0000 mg | ORAL_TABLET | Freq: Every day | ORAL | 0 refills | Status: DC
  Filled 2023-01-05: qty 30, 30d supply, fill #0
  Filled 2023-02-04: qty 100, 100d supply, fill #0

## 2023-01-05 MED ORDER — EZETIMIBE 10 MG PO TABS
10.0000 mg | ORAL_TABLET | Freq: Every day | ORAL | 3 refills | Status: DC
  Filled 2023-01-05: qty 30, 30d supply, fill #0
  Filled 2023-02-04: qty 90, 90d supply, fill #0

## 2023-01-05 MED ORDER — ROPINIROLE HCL 3 MG PO TABS
3.0000 mg | ORAL_TABLET | Freq: Every day | ORAL | 0 refills | Status: DC
  Filled 2023-01-05: qty 30, 30d supply, fill #0
  Filled 2023-02-04: qty 90, 90d supply, fill #0

## 2023-01-05 MED ORDER — BUDESONIDE-FORMOTEROL FUMARATE 160-4.5 MCG/ACT IN AERO
2.0000 | INHALATION_SPRAY | Freq: Two times a day (BID) | RESPIRATORY_TRACT | 0 refills | Status: DC
  Filled 2023-01-05: qty 10.2, 30d supply, fill #0

## 2023-01-05 MED ORDER — LANCETS 33G MISC
0 refills | Status: DC
Start: 2022-06-02 — End: 2023-07-01

## 2023-01-05 MED ORDER — ICOSAPENT ETHYL 1 G PO CAPS
2.0000 g | ORAL_CAPSULE | Freq: Two times a day (BID) | ORAL | 0 refills | Status: DC
  Filled 2023-01-05: qty 120, 30d supply, fill #0

## 2023-01-05 MED ORDER — CYANOCOBALAMIN 2500 MCG SL SUBL
1.0000 | SUBLINGUAL_TABLET | Freq: Every day | SUBLINGUAL | 0 refills | Status: DC
  Filled 2023-02-04: qty 30, 30d supply, fill #0

## 2023-01-05 MED ORDER — ONDANSETRON HCL 4 MG PO TABS
4.0000 mg | ORAL_TABLET | Freq: Four times a day (QID) | ORAL | 10 refills | Status: DC | PRN
  Filled 2023-01-05: qty 90, 23d supply, fill #0

## 2023-01-05 MED ORDER — DICYCLOMINE HCL 20 MG PO TABS
20.0000 mg | ORAL_TABLET | Freq: Four times a day (QID) | ORAL | 0 refills | Status: DC
  Filled 2023-01-05: qty 120, 30d supply, fill #0
  Filled 2023-02-04: qty 360, 90d supply, fill #0

## 2023-01-05 MED ORDER — VORTIOXETINE HBR 20 MG PO TABS
20.0000 mg | ORAL_TABLET | Freq: Every day | ORAL | 0 refills | Status: DC
  Filled 2023-01-05: qty 30, 30d supply, fill #0
  Filled 2023-02-04: qty 90, 90d supply, fill #0

## 2023-01-05 MED ORDER — RAMIPRIL 2.5 MG PO CAPS
2.5000 mg | ORAL_CAPSULE | Freq: Every day | ORAL | 0 refills | Status: DC
  Filled 2023-01-05: qty 30, 30d supply, fill #0
  Filled 2023-02-04: qty 90, 90d supply, fill #0

## 2023-01-05 MED ORDER — SOLIFENACIN SUCCINATE 5 MG PO TABS
5.0000 mg | ORAL_TABLET | Freq: Every day | ORAL | 0 refills | Status: DC
  Filled 2023-01-05: qty 30, 30d supply, fill #0
  Filled 2023-02-04: qty 90, 90d supply, fill #0

## 2023-01-05 MED ORDER — OMEPRAZOLE 20 MG PO CPDR
20.0000 mg | DELAYED_RELEASE_CAPSULE | Freq: Every day | ORAL | 0 refills | Status: DC
  Filled 2023-01-05: qty 30, 30d supply, fill #0
  Filled 2023-02-04: qty 90, 90d supply, fill #0

## 2023-01-05 MED ORDER — FLUTICASONE PROPIONATE 50 MCG/ACT NA SUSP
2.0000 | Freq: Every day | NASAL | 2 refills | Status: DC
  Filled 2023-01-05: qty 16, 30d supply, fill #0
  Filled 2023-02-04: qty 48, 84d supply, fill #0

## 2023-01-05 MED ORDER — AMITRIPTYLINE HCL 25 MG PO TABS
25.0000 mg | ORAL_TABLET | Freq: Every evening | ORAL | 2 refills | Status: DC
  Filled 2023-02-04: qty 30, 30d supply, fill #0

## 2023-01-05 MED ORDER — GLUCOSE BLOOD VI STRP
ORAL_STRIP | 0 refills | Status: DC
  Filled 2023-01-05: qty 100, 100d supply, fill #0

## 2023-01-05 MED ORDER — TOPIRAMATE 50 MG PO TABS
50.0000 mg | ORAL_TABLET | Freq: Every day | ORAL | 0 refills | Status: DC
  Filled 2023-01-05: qty 30, 30d supply, fill #0

## 2023-01-05 MED ORDER — RYBELSUS 14 MG PO TABS
14.0000 mg | ORAL_TABLET | Freq: Every day | ORAL | 0 refills | Status: DC
  Filled 2023-01-05 – 2023-02-04 (×2): qty 30, 30d supply, fill #0

## 2023-01-05 MED ORDER — TRIAMCINOLONE ACETONIDE 0.1 % EX CREA
1.0000 | TOPICAL_CREAM | Freq: Two times a day (BID) | CUTANEOUS | 10 refills | Status: DC
  Filled 2023-01-05: qty 80, 30d supply, fill #0
  Filled 2023-02-04: qty 80, 30d supply, fill #1

## 2023-01-05 MED ORDER — ROSUVASTATIN CALCIUM 40 MG PO TABS
40.0000 mg | ORAL_TABLET | Freq: Every day | ORAL | 0 refills | Status: DC
  Filled 2023-01-05 – 2023-02-04 (×2): qty 30, 30d supply, fill #0

## 2023-01-07 DIAGNOSIS — R32 Unspecified urinary incontinence: Secondary | ICD-10-CM | POA: Diagnosis not present

## 2023-01-09 ENCOUNTER — Other Ambulatory Visit: Payer: Self-pay

## 2023-01-12 ENCOUNTER — Other Ambulatory Visit: Payer: Self-pay

## 2023-01-26 DIAGNOSIS — N959 Unspecified menopausal and perimenopausal disorder: Secondary | ICD-10-CM | POA: Diagnosis not present

## 2023-01-26 DIAGNOSIS — Z0189 Encounter for other specified special examinations: Secondary | ICD-10-CM | POA: Diagnosis not present

## 2023-01-26 LAB — HM DEXA SCAN: HM Dexa Scan: NORMAL

## 2023-01-27 ENCOUNTER — Other Ambulatory Visit: Payer: Self-pay | Admitting: Family Medicine

## 2023-01-27 ENCOUNTER — Encounter: Payer: Self-pay | Admitting: Family Medicine

## 2023-02-01 ENCOUNTER — Ambulatory Visit
Admission: RE | Admit: 2023-02-01 | Discharge: 2023-02-01 | Disposition: A | Discharge status: Home / Self Care | Payer: Medicare HMO | Source: Ambulatory Visit | Attending: Family Medicine | Admitting: Family Medicine

## 2023-02-01 DIAGNOSIS — Z1231 Encounter for screening mammogram for malignant neoplasm of breast: Secondary | ICD-10-CM | POA: Diagnosis not present

## 2023-02-04 ENCOUNTER — Other Ambulatory Visit: Payer: Self-pay | Admitting: Family Medicine

## 2023-02-04 ENCOUNTER — Other Ambulatory Visit (HOSPITAL_COMMUNITY): Payer: Self-pay

## 2023-02-05 MED FILL — Icosapent Ethyl Cap 1 GM: ORAL | 60 days supply | Qty: 240 | Fill #0 | Status: AC

## 2023-02-06 ENCOUNTER — Other Ambulatory Visit (HOSPITAL_COMMUNITY): Payer: Self-pay

## 2023-02-06 ENCOUNTER — Other Ambulatory Visit: Payer: Self-pay

## 2023-02-07 ENCOUNTER — Other Ambulatory Visit: Payer: Self-pay | Admitting: Nurse Practitioner

## 2023-02-07 ENCOUNTER — Other Ambulatory Visit: Payer: Self-pay

## 2023-02-07 ENCOUNTER — Other Ambulatory Visit: Payer: Self-pay | Admitting: Family Medicine

## 2023-02-07 DIAGNOSIS — Z13820 Encounter for screening for osteoporosis: Secondary | ICD-10-CM

## 2023-02-07 DIAGNOSIS — E1169 Type 2 diabetes mellitus with other specified complication: Secondary | ICD-10-CM

## 2023-02-07 DIAGNOSIS — R928 Other abnormal and inconclusive findings on diagnostic imaging of breast: Secondary | ICD-10-CM

## 2023-02-07 DIAGNOSIS — E1142 Type 2 diabetes mellitus with diabetic polyneuropathy: Secondary | ICD-10-CM

## 2023-02-07 DIAGNOSIS — E1159 Type 2 diabetes mellitus with other circulatory complications: Secondary | ICD-10-CM

## 2023-02-07 DIAGNOSIS — K219 Gastro-esophageal reflux disease without esophagitis: Secondary | ICD-10-CM

## 2023-02-07 DIAGNOSIS — Z1231 Encounter for screening mammogram for malignant neoplasm of breast: Secondary | ICD-10-CM

## 2023-02-07 DIAGNOSIS — F330 Major depressive disorder, recurrent, mild: Secondary | ICD-10-CM

## 2023-02-07 DIAGNOSIS — F5101 Primary insomnia: Secondary | ICD-10-CM

## 2023-02-07 DIAGNOSIS — I152 Hypertension secondary to endocrine disorders: Secondary | ICD-10-CM

## 2023-02-09 ENCOUNTER — Other Ambulatory Visit: Payer: Self-pay

## 2023-02-09 ENCOUNTER — Encounter: Payer: Self-pay | Admitting: Pharmacist

## 2023-02-10 ENCOUNTER — Other Ambulatory Visit (HOSPITAL_COMMUNITY): Payer: Self-pay

## 2023-02-10 ENCOUNTER — Other Ambulatory Visit: Payer: Self-pay

## 2023-02-10 ENCOUNTER — Telehealth: Payer: Self-pay

## 2023-02-10 NOTE — Telephone Encounter (Signed)
Copied from CRM [id number]. Topic: Clinical - Lab/Test Results >> Feb 10, 2023 10:10 AM Larwance Sachs wrote: Reason for CRM: Patient stated she had a mammogram and would like a nurse to call her back to go over result so she can be a little at ease and have a better understanding   Call back at [phone]

## 2023-02-10 NOTE — Telephone Encounter (Signed)
Called and spoke with patient regarding her concerns. Did confirm that the Mammogram results suggested additional testing of her right breast due to a possible mass, at this time it was not said to be malignant, however they want to further evaluate and rule out malignancy.

## 2023-02-12 DIAGNOSIS — E860 Dehydration: Secondary | ICD-10-CM | POA: Diagnosis not present

## 2023-02-12 DIAGNOSIS — R197 Diarrhea, unspecified: Secondary | ICD-10-CM | POA: Diagnosis not present

## 2023-02-12 DIAGNOSIS — R112 Nausea with vomiting, unspecified: Secondary | ICD-10-CM | POA: Diagnosis not present

## 2023-02-13 ENCOUNTER — Ambulatory Visit (INDEPENDENT_AMBULATORY_CARE_PROVIDER_SITE_OTHER): Payer: Medicare HMO

## 2023-02-13 ENCOUNTER — Emergency Department (HOSPITAL_COMMUNITY)
Admission: EM | Admit: 2023-02-13 | Discharge: 2023-02-13 | Discharge status: Against Medical Advice | Payer: Medicare HMO | Attending: Emergency Medicine | Admitting: Emergency Medicine

## 2023-02-13 ENCOUNTER — Encounter (HOSPITAL_COMMUNITY): Payer: Self-pay | Admitting: Emergency Medicine

## 2023-02-13 VITALS — BP 138/68 | HR 89 | Temp 98.5°F | Resp 16 | Wt 191.2 lb

## 2023-02-13 DIAGNOSIS — Z5321 Procedure and treatment not carried out due to patient leaving prior to being seen by health care provider: Secondary | ICD-10-CM | POA: Insufficient documentation

## 2023-02-13 DIAGNOSIS — R1011 Right upper quadrant pain: Secondary | ICD-10-CM | POA: Insufficient documentation

## 2023-02-13 DIAGNOSIS — R1110 Vomiting, unspecified: Secondary | ICD-10-CM | POA: Diagnosis not present

## 2023-02-13 DIAGNOSIS — R110 Nausea: Secondary | ICD-10-CM | POA: Diagnosis not present

## 2023-02-13 DIAGNOSIS — R112 Nausea with vomiting, unspecified: Secondary | ICD-10-CM | POA: Diagnosis not present

## 2023-02-13 DIAGNOSIS — R197 Diarrhea, unspecified: Secondary | ICD-10-CM | POA: Insufficient documentation

## 2023-02-13 DIAGNOSIS — R5383 Other fatigue: Secondary | ICD-10-CM | POA: Insufficient documentation

## 2023-02-13 DIAGNOSIS — E860 Dehydration: Secondary | ICD-10-CM | POA: Diagnosis not present

## 2023-02-13 DIAGNOSIS — R748 Abnormal levels of other serum enzymes: Secondary | ICD-10-CM | POA: Diagnosis not present

## 2023-02-13 MED ORDER — ONDANSETRON HCL 4 MG PO TABS: 4.0000 mg | ORAL_TABLET | Freq: Three times a day (TID) | ORAL | 0 refills | Status: AC | PRN

## 2023-02-13 NOTE — Patient Instructions (Signed)
Ordered stool tests to be done in another day or two if your symptoms do not improve Repeat lipase level today Sent zofran to your pharmacy Drink more gatorade and try to eat bland Report back if any worsening symptoms or call 911 for any severe symptoms

## 2023-02-13 NOTE — Assessment & Plan Note (Addendum)
Acute onset of nausea, vomiting, and diarrhea for four days. Symptoms include vomiting and diarrhea 3-4 times daily, inability to eat, and mild dehydration. No abdominal pain. Elevated lipase (345 U/L) noted, no infection or anemia. Normal kidney and liver functions.   Differential includes viral gastroenteritis, bacterial gastroenteritis, and food poisoning.   Discussed hydration importance and potential antibiotics if bacterial infection confirmed. Imodium can be used sparingly for diarrhea. - Order stool studies for bacterial infection - Repeat lipase level today - Prescribe Zofran (ondansetron) 15 tablets, 1 tablet TID PRN for nausea - Recommend OTC Imodium for diarrhea if needed - Advise increased fluid intake, especially Gatorade - Encourage bland diet (bread, toast, oatmeal) - Instruct to return if symptoms worsen or severe abdominal pain/high fever develops - Advise ER visit if no improvement in five days   Follow-up - Submit stool sample for analysis if diarrhea persists - Repeat lipase level today - Return to clinic if no improvement in five days or if severe symptoms develop - Go to ER if symptoms worsen significantly.   Dehydration Mild dehydration secondary to gastroenteritis. Symptoms include dry, coated tongue, reduced oral intake, and vomiting. No severe dehydration signs. Emphasized maintaining hydration to prevent complications. - Encourage increased fluid intake, especially Gatorade - Monitor for worsening dehydration and advise seeking medical attention if symptoms persist

## 2023-02-13 NOTE — Progress Notes (Signed)
Acute Office Visit  Subjective:    Patient ID: Hailey Greene, female    DOB: [birth year], 72 y.o.   MRN: [id number]  Chief Complaint  Patient presents with   Emesis   Diarrhea   Nausea    HPI: Patient is in today.  Patient presents with diarrhea, nausea and vomiting. Symptoms started three and a half days ago. The patient presented with a four-day history of nausea, vomiting, and diarrhea, which started on November 14th. The symptoms were severe enough to prevent the patient from eating for the past four days. The patient reported vomiting and having diarrhea three to four times a day. Despite these symptoms, the patient denied experiencing any abdominal pain.  The patient sought care at the [facility] emergency room the day before the current consultation. At the hospital, the patient received intravenous fluids but was not given any medication for the diarrhea. The patient was discharged with a prescription for Phenergan to manage the nausea, which was reported to be ineffective.  The patient's symptoms remained unchanged since the hospital visit. The diarrhea was described as frequent, watery, and without any visible blood. The patient denied recent travel or antibiotic use. The patient also mentioned a family member who had similar symptoms two weeks prior, but with additional head congestion.  The patient's blood work from the hospital visit showed normal blood counts, kidney function, and liver numbers. However, the lipase level was slightly elevated at 345. The patient's urine test results were normal, and an EKG performed was also reported as normal. The patient was not able to eat anything since the onset of symptoms but had been drinking small amounts of water. The patient has been drinking Gatorade as recommended by the hospital.  Past Medical History:  Diagnosis Date   2019 novel coronavirus disease (COVID-19) 12/31/2018   Acute hypoxemic respiratory failure due to  COVID-19 (HCC)    Chicken pox    Depression    Diabetes (HCC)    GERD (gastroesophageal reflux disease)    Hypertension    Idiopathic progressive neuropathy    Major depressive disorder    Mixed hyperlipidemia    Neuropathy    Osteoarthritis    Pneumonia    Primary insomnia    Primary osteoarthritis of left knee 05/31/2019   RLS (restless legs syndrome)    S/P total knee replacement 07/15/2019   Status post total knee replacement 07/25/2019   Tachycardia    Urge incontinence    UTI (lower urinary tract infection)    Weakness     Past Surgical History:  Procedure Laterality Date   ABDOMINAL HYSTERECTOMY     APPENDECTOMY     CHOLECYSTECTOMY     HERNIA REPAIR     REPLACEMENT TOTAL KNEE Right    TONSILLECTOMY     TOTAL KNEE ARTHROPLASTY Left 07/15/2019   Procedure: TOTAL KNEE ARTHROPLASTY;  Surgeon: Dannielle Huh, MD;  Location: [facility];  Service: Orthopedics;  Laterality: Left;    Family History  Problem Relation Age of Onset   Thyroid disease Mother    Cancer Mother    Migraines Mother    Brain cancer Father    Heart disease Maternal Grandmother    Diabetes Daughter     Social History   Socioeconomic History   Marital status: Married    Spouse name: Not on file   Number of children: 3   Years of education: 12   Highest education level: Not on file  Occupational History  Occupation: Housewife  Tobacco Use   Smoking status: Former    Current packs/day: 0.00    Average packs/day: 2.0 packs/day for 12.0 years (24.0 ttl pk-yrs)    Types: Cigarettes    Start date: 05/25/1998    Quit date: 05/25/2010    Years since quitting: 12.7   Smokeless tobacco: Never  Vaping Use   Vaping status: Never Used  Substance and Sexual Activity   Alcohol use: Yes    Comment: occasional   Drug use: No   Sexual activity: Not on file  Other Topics Concern   Not on file  Social History Narrative   Born and raised in [place], [place]. Currently lives in a house with her husband. 1 dog.  Fun: Garden, feed birds, swimming   Denies any religious beliefs effecting health care.    Social Determinants of Health   Financial Resource Strain: High Risk (02/12/2020)   Overall Financial Resource Strain (CARDIA)    Difficulty of Paying Living Expenses: Hard  Food Insecurity: No Food Insecurity (12/29/2021)   Hunger Vital Sign    Worried About Running Out of Food in the Last Year: Never true    Ran Out of Food in the Last Year: Never true  Transportation Needs: No Transportation Needs (12/29/2021)   PRAPARE - Administrator, Civil Service (Medical): No    Lack of Transportation (Non-Medical): No  Physical Activity: Insufficiently Active (02/12/2020)   Exercise Vital Sign    Days of Exercise per Week: 7 days    Minutes of Exercise per Session: 20 min  Stress: Not on file  Social Connections: Not on file  Intimate Partner Violence: Unknown (12/29/2021)   Humiliation, Afraid, Rape, and Kick questionnaire    Fear of Current or Ex-Partner: No    Emotionally Abused: No    Physically Abused: No    Sexually Abused: Not on file    Outpatient Medications Prior to Visit  Medication Sig Dispense Refill   amitriptyline (ELAVIL) 25 MG tablet Take 1 tablet (25 mg total) by mouth every evening. 30 tablet 2   ASPIRIN 81 PO Take 81 mg by mouth daily.     blood glucose meter kit and supplies Dispense based on patient and insurance preference. Use up to four times daily as directed. (FOR ICD-10 E10.9, E11.9). 1 each 0   Blood Glucose Monitoring Suppl (ONETOUCH VERIO) w/Device KIT Use daily to check FBS 1 kit 0   budesonide-formoterol (SYMBICORT) 160-4.5 MCG/ACT inhaler Inhale 2 puffs into the lungs 2 (two) times daily. 10.2 g 3   Eszopiclone 3 MG TABS TAKE 1 TABLET BY MOUTH EVERY DAY AT BEDTIME 30 tablet 0   ezetimibe (ZETIA) 10 MG tablet TAKE 1 TABLET BY MOUTH ONCE DAILY *REFILL REQUEST* 90 tablet 3   fluticasone (FLONASE) 50 MCG/ACT nasal spray SPRAY 2 SPRAYS INTO EACH NOSTRIL EVERY  DAY 48 g 2   glucose blood (ONETOUCH VERIO) test strip E11.42 Use new test strip each time when checking FBS 100 each 2   icosapent Ethyl (VASCEPA) 1 g capsule Take 2 capsules (2 g total) by mouth 2 (two) times daily. 240 capsule 1   Lancets (ONETOUCH DELICA PLUS LANCET33G) MISC E 11.42 Use new lancet each time when checking FBS 100 each 2   loratadine (CLARITIN) 10 MG tablet Take 1 tablet (10 mg total) by mouth daily. 90 tablet 3   metoCLOPramide (REGLAN) 10 MG tablet Take 1 tablet (10 mg total) by mouth every 6 (six) hours as needed  for nausea. 15 tablet 0   Multiple Vitamin (MULTIVITAMIN WITH MINERALS) TABS tablet Take 1 tablet by mouth daily.     omeprazole (PRILOSEC) 20 MG capsule Take 1 capsule (20 mg total) by mouth daily. 90 capsule 2   ondansetron (ZOFRAN) 4 MG tablet TAKE 1 TABLET BY MOUTH EVERY 6 HOURS AS NEEDED FOR NAUSEA AND VOMITING 90 tablet 10   phentermine (ADIPEX-P) 37.5 MG tablet Take 1 tablet (37.5 mg total) by mouth every morning. 30 tablet 2   pregabalin (LYRICA) 300 MG capsule TAKE 1 CAPSULE BY MOUTH TWICE DAILY 60 capsule 0   promethazine (PHENERGAN) 25 MG tablet Take 1 tablet (25 mg total) by mouth every 6 (six) hours as needed for nausea or vomiting. 30 tablet 0   ramipril (ALTACE) 2.5 MG capsule Take 1 capsule (2.5 mg total) by mouth daily. 90 capsule 1   rOPINIRole (REQUIP) 3 MG tablet Take 1 tablet (3 mg total) by mouth daily 1-3 hours before bedtime. 90 tablet 1   rosuvastatin (CRESTOR) 40 MG tablet Take 1 tablet (40 mg total) by mouth daily. 90 tablet 1   Semaglutide (RYBELSUS) 14 MG TABS Take 1 tablet (14 mg total) by mouth daily. 90 tablet 1   solifenacin (VESICARE) 5 MG tablet Take 1 tablet (5 mg total) by mouth daily. 90 tablet 1   topiramate (TOPAMAX) 50 MG tablet Take 1 tablet (50 mg total) by mouth daily. 90 tablet 0   triamcinolone cream (KENALOG) 0.1 % APPLY 1 APPLICATION TOPICALLY TWICE DAILY 80 g 10   vortioxetine HBr (TRINTELLIX) 20 MG TABS tablet Take 1  tablet (20 mg total) by mouth daily. 90 tablet 1   amitriptyline (ELAVIL) 25 MG tablet Take 1 tablet (25 mg total) by mouth every evening. (Patient not taking: Reported on 02/13/2023) 30 tablet 2   budesonide-formoterol (SYMBICORT) 160-4.5 MCG/ACT inhaler Inhale 2 puffs into the lungs 2 (two) times daily. (Patient not taking: Reported on 02/13/2023) 20.4 g 0   Cyanocobalamin (VITAMIN B-12) 2500 MCG SUBL DISSOLVE 1 TABLET UNDER THE TONGUE DAILY 90 tablet 3   Cyanocobalamin 2500 MCG SUBL Place 1 tablet (2,500 mcg total) under the tongue daily. (Patient not taking: Reported on 02/13/2023) 60 tablet 0   dicyclomine (BENTYL) 20 MG tablet Take 1 tablet (20 mg total) by mouth 4 (four) times daily as needed (intestinal spasms). 360 tablet 1   dicyclomine (BENTYL) 20 MG tablet Take 1 tablet (20 mg total) by mouth every 6 (six) hours. (Patient not taking: Reported on 02/13/2023) 600 tablet 0   ezetimibe (ZETIA) 10 MG tablet Take 1 tablet (10 mg total) by mouth daily. (Patient not taking: Reported on 02/13/2023) 90 tablet 3   fluticasone (FLONASE) 50 MCG/ACT nasal spray Place 2 sprays into both nostrils daily. (Patient not taking: Reported on 02/13/2023) 48 g 2   glucose blood test strip Use to test blood sugar once daily as directed 100 each 0   icosapent Ethyl (VASCEPA) 1 g capsule Take 2 capsules (2 g total) by mouth 2 (two) times daily. (Patient not taking: Reported on 02/13/2023) 360 capsule 1   Lancets 33G MISC Use to check blood sugar once daily as directed (Patient not taking: Reported on 02/13/2023) 100 each 0   loratadine (CLARITIN) 10 MG tablet Take 1 tablet (10 mg total) by mouth daily. (Patient not taking: Reported on 02/13/2023) 240 tablet 0   omeprazole (PRILOSEC) 20 MG capsule Take 1 capsule (20 mg total) by mouth daily. (Patient not taking: Reported on  02/13/2023) 240 capsule 0   ondansetron (ZOFRAN) 4 MG tablet Take 1 tablet (4 mg total) by mouth every 6 (six) hours as needed for nausea and  vomiting. (Patient not taking: Reported on 02/13/2023) 90 tablet 10   ramipril (ALTACE) 2.5 MG capsule Take 1 capsule (2.5 mg total) by mouth daily. (Patient not taking: Reported on 02/13/2023) 150 capsule 0   rOPINIRole (REQUIP) 3 MG tablet Take 1 tablet (3 mg total) by mouth daily 1 to 3 hours before bedtime. (Patient not taking: Reported on 02/13/2023) 150 tablet 0   rosuvastatin (CRESTOR) 40 MG tablet Take 1 tablet (40 mg total) by mouth daily. (Patient not taking: Reported on 02/13/2023) 60 tablet 0   Semaglutide (RYBELSUS) 14 MG TABS Take 1 tablet (14 mg total) by mouth daily. (Patient not taking: Reported on 02/13/2023) 60 tablet 0   solifenacin (VESICARE) 5 MG tablet Take 1 tablet (5 mg total) by mouth daily. (Patient not taking: Reported on 02/13/2023) 150 tablet 0   topiramate (TOPAMAX) 50 MG tablet Take 1 tablet (50 mg total) by mouth daily. (Patient not taking: Reported on 02/13/2023) 150 tablet 0   triamcinolone cream (KENALOG) 0.1 % Apply 1 Application topically to the affected area 2 (two) times daily. (Patient not taking: Reported on 02/13/2023) 80 g 10   vortioxetine HBr (TRINTELLIX) 20 MG TABS tablet Take 1 tablet (20 mg total) by mouth daily. (Patient not taking: Reported on 02/13/2023) 150 tablet 0   No facility-administered medications prior to visit.    No Known Allergies  Review of Systems  Constitutional:  Positive for appetite change and fatigue.  HENT: Negative.    Eyes: Negative.   Respiratory: Negative.    Cardiovascular: Negative.   Gastrointestinal:  Positive for abdominal pain, diarrhea, nausea and vomiting.  Endocrine: Negative.   Genitourinary: Negative.   Musculoskeletal: Negative.   Neurological: Negative.   Psychiatric/Behavioral: Negative.         Objective:        02/13/2023    9:21 AM 11/16/2022    8:47 AM 07/29/2022    9:12 AM  Vitals with BMI  Height  5\' 3"  5\' 3"   Weight 191 lbs 3 oz 192 lbs 13 oz 193 lbs  BMI  34.16 34.2  Systolic 138  110 120  Diastolic 68 60 60  Pulse 89 84 80    Orthostatic VS for the past 72 hrs (Last 3 readings):  Patient Position BP Location Cuff Size  02/13/23 0921 Sitting Left Arm Large     Physical Exam Constitutional:      Comments: HEENT: Oral mucosa dry. Tongue dry and coated. CHEST: Lungs clear to auscultation. CARDIOVASCULAR: Heart sounds normal. ABDOMEN: Bowel sounds appropriately active. Mild right upper quadrant tenderness on palpation.     Health Maintenance Due  Topic Date Due   Colonoscopy  Never done   Lung Cancer Screening  06/30/2020   Medicare Annual Wellness (AWV)  03/11/2022   OPHTHALMOLOGY EXAM  08/06/2022    There are no preventive care reminders to display for this patient.   Lab Results  Component Value Date   TSH 2.690 11/16/2022   Lab Results  Component Value Date   WBC 6.6 11/16/2022   HGB 13.8 11/16/2022   HCT 42.0 11/16/2022   MCV 90 11/16/2022   PLT 232 11/16/2022   Lab Results  Component Value Date   NA 135 11/16/2022   K 5.1 11/16/2022   CO2 24 11/16/2022   GLUCOSE 96 11/16/2022  BUN 10 11/16/2022   CREATININE 0.80 11/16/2022   BILITOT 0.4 11/16/2022   ALKPHOS 89 11/16/2022   AST 13 11/16/2022   ALT 13 11/16/2022   PROT 6.3 11/16/2022   ALBUMIN 4.0 11/16/2022   CALCIUM 9.5 11/16/2022   ANIONGAP 16 (H) 02/19/2022   EGFR 79 11/16/2022   Lab Results  Component Value Date   CHOL 252 (H) 11/16/2022   Lab Results  Component Value Date   HDL 52 11/16/2022   Lab Results  Component Value Date   LDLCALC 163 (H) 11/16/2022   Lab Results  Component Value Date   TRIG 201 (H) 11/16/2022   Lab Results  Component Value Date   CHOLHDL 4.8 (H) 11/16/2022   Lab Results  Component Value Date   HGBA1C 6.0 (H) 11/16/2022       Assessment & Plan:  Diarrhea of presumed infectious origin Assessment & Plan: Acute onset of nausea, vomiting, and diarrhea for four days. Symptoms include vomiting and diarrhea 3-4 times daily,  inability to eat, and mild dehydration. No abdominal pain. Elevated lipase (345 U/L) noted, no infection or anemia. Normal kidney and liver functions.   Differential includes viral gastroenteritis, bacterial gastroenteritis, and food poisoning.   Discussed hydration importance and potential antibiotics if bacterial infection confirmed. Imodium can be used sparingly for diarrhea. - Order stool studies for bacterial infection - Repeat lipase level today - Prescribe Zofran (ondansetron) 15 tablets, 1 tablet TID PRN for nausea - Recommend OTC Imodium for diarrhea if needed - Advise increased fluid intake, especially Gatorade - Encourage bland diet (bread, toast, oatmeal) - Instruct to return if symptoms worsen or severe abdominal pain/high fever develops - Advise ER visit if no improvement in five days   Follow-up - Submit stool sample for analysis if diarrhea persists - Repeat lipase level today - Return to clinic if no improvement in five days or if severe symptoms develop - Go to ER if symptoms worsen significantly.   Dehydration Mild dehydration secondary to gastroenteritis. Symptoms include dry, coated tongue, reduced oral intake, and vomiting. No severe dehydration signs. Emphasized maintaining hydration to prevent complications. - Encourage increased fluid intake, especially Gatorade - Monitor for worsening dehydration and advise seeking medical attention if symptoms persist  Orders: -     Cdiff NAA+O+P+Stool Culture -     Lipase -     Amylase  Elevated lipase Assessment & Plan: Elevated lipase (345 U/L) noted. Possible causes include dehydration and infection. No abdominal pain, reducing likelihood of acute pancreatitis. Need to monitor lipase levels to rule out ongoing pancreatic issues. - Repeat lipase level today - Consider further investigation if lipase remains elevated  Orders: -     Lipase  Right upper quadrant pain -     Amylase  Other orders -     Ondansetron  HCl; Take 1 tablet (4 mg total) by mouth every 8 (eight) hours as needed for up to 5 days for nausea or vomiting.  Dispense: 15 tablet; Refill: 0     Meds ordered this encounter  Medications   ondansetron (ZOFRAN) 4 MG tablet    Sig: Take 1 tablet (4 mg total) by mouth every 8 (eight) hours as needed for up to 5 days for nausea or vomiting.    Dispense:  15 tablet    Refill:  0    Orders Placed This Encounter  Procedures   Cdiff NAA+O+P+Stool Culture   Lipase   Amylase     Follow-up: No follow-ups on file.  An After Visit Summary was printed and given to the patient.  Windell Moment, MD [facility] [phone]

## 2023-02-13 NOTE — ED Triage Notes (Signed)
Patient arrives in wheelchair by POV with family- states she was diagnosed with the flu at [facility] about 4 days ago. C/o fatigue, nausea and diarrhea. States diarrhea is improving since taking medicine. Went to PCP today as well and had labs drawn.

## 2023-02-13 NOTE — Assessment & Plan Note (Signed)
Elevated lipase (345 U/L) noted. Possible causes include dehydration and infection. No abdominal pain, reducing likelihood of acute pancreatitis. Need to monitor lipase levels to rule out ongoing pancreatic issues. - Repeat lipase level today - Consider further investigation if lipase remains elevated

## 2023-02-14 DIAGNOSIS — R1110 Vomiting, unspecified: Secondary | ICD-10-CM | POA: Diagnosis not present

## 2023-02-14 DIAGNOSIS — Z79899 Other long term (current) drug therapy: Secondary | ICD-10-CM | POA: Diagnosis not present

## 2023-02-14 DIAGNOSIS — R9431 Abnormal electrocardiogram [ECG] [EKG]: Secondary | ICD-10-CM | POA: Diagnosis not present

## 2023-02-14 DIAGNOSIS — R197 Diarrhea, unspecified: Secondary | ICD-10-CM | POA: Diagnosis not present

## 2023-02-14 DIAGNOSIS — Z20822 Contact with and (suspected) exposure to covid-19: Secondary | ICD-10-CM | POA: Diagnosis not present

## 2023-02-14 DIAGNOSIS — K529 Noninfective gastroenteritis and colitis, unspecified: Secondary | ICD-10-CM | POA: Diagnosis not present

## 2023-02-14 LAB — LIPASE: Lipase: 23 U/L (ref 14–85)

## 2023-02-14 LAB — AMYLASE: Amylase: 63 U/L (ref 31–110)

## 2023-02-15 DIAGNOSIS — D509 Iron deficiency anemia, unspecified: Secondary | ICD-10-CM | POA: Diagnosis not present

## 2023-02-15 DIAGNOSIS — K529 Noninfective gastroenteritis and colitis, unspecified: Secondary | ICD-10-CM | POA: Diagnosis not present

## 2023-02-15 DIAGNOSIS — R1110 Vomiting, unspecified: Secondary | ICD-10-CM | POA: Diagnosis not present

## 2023-02-15 DIAGNOSIS — E1142 Type 2 diabetes mellitus with diabetic polyneuropathy: Secondary | ICD-10-CM | POA: Diagnosis not present

## 2023-02-15 DIAGNOSIS — Z8701 Personal history of pneumonia (recurrent): Secondary | ICD-10-CM | POA: Diagnosis not present

## 2023-02-15 DIAGNOSIS — R9431 Abnormal electrocardiogram [ECG] [EKG]: Secondary | ICD-10-CM | POA: Diagnosis not present

## 2023-02-15 DIAGNOSIS — I10 Essential (primary) hypertension: Secondary | ICD-10-CM | POA: Diagnosis not present

## 2023-02-15 DIAGNOSIS — R197 Diarrhea, unspecified: Secondary | ICD-10-CM | POA: Diagnosis not present

## 2023-02-15 DIAGNOSIS — F32A Depression, unspecified: Secondary | ICD-10-CM | POA: Diagnosis not present

## 2023-02-15 DIAGNOSIS — E876 Hypokalemia: Secondary | ICD-10-CM | POA: Diagnosis not present

## 2023-02-15 DIAGNOSIS — E8729 Other acidosis: Secondary | ICD-10-CM | POA: Diagnosis not present

## 2023-02-15 DIAGNOSIS — M1990 Unspecified osteoarthritis, unspecified site: Secondary | ICD-10-CM | POA: Diagnosis not present

## 2023-02-15 DIAGNOSIS — E785 Hyperlipidemia, unspecified: Secondary | ICD-10-CM | POA: Diagnosis not present

## 2023-02-15 DIAGNOSIS — R112 Nausea with vomiting, unspecified: Secondary | ICD-10-CM | POA: Diagnosis not present

## 2023-02-16 DIAGNOSIS — D509 Iron deficiency anemia, unspecified: Secondary | ICD-10-CM | POA: Diagnosis not present

## 2023-02-16 DIAGNOSIS — E8729 Other acidosis: Secondary | ICD-10-CM | POA: Diagnosis not present

## 2023-02-16 DIAGNOSIS — E785 Hyperlipidemia, unspecified: Secondary | ICD-10-CM | POA: Diagnosis not present

## 2023-02-16 DIAGNOSIS — F32A Depression, unspecified: Secondary | ICD-10-CM | POA: Diagnosis not present

## 2023-02-16 DIAGNOSIS — Z8701 Personal history of pneumonia (recurrent): Secondary | ICD-10-CM | POA: Diagnosis not present

## 2023-02-16 DIAGNOSIS — I10 Essential (primary) hypertension: Secondary | ICD-10-CM | POA: Diagnosis not present

## 2023-02-16 DIAGNOSIS — M1990 Unspecified osteoarthritis, unspecified site: Secondary | ICD-10-CM | POA: Diagnosis not present

## 2023-02-16 DIAGNOSIS — E1142 Type 2 diabetes mellitus with diabetic polyneuropathy: Secondary | ICD-10-CM | POA: Diagnosis not present

## 2023-02-17 DIAGNOSIS — M1990 Unspecified osteoarthritis, unspecified site: Secondary | ICD-10-CM | POA: Diagnosis not present

## 2023-02-17 DIAGNOSIS — I10 Essential (primary) hypertension: Secondary | ICD-10-CM | POA: Diagnosis not present

## 2023-02-17 DIAGNOSIS — Z8701 Personal history of pneumonia (recurrent): Secondary | ICD-10-CM | POA: Diagnosis not present

## 2023-02-17 DIAGNOSIS — D509 Iron deficiency anemia, unspecified: Secondary | ICD-10-CM | POA: Diagnosis not present

## 2023-02-17 DIAGNOSIS — E876 Hypokalemia: Secondary | ICD-10-CM | POA: Diagnosis not present

## 2023-02-17 DIAGNOSIS — E1142 Type 2 diabetes mellitus with diabetic polyneuropathy: Secondary | ICD-10-CM | POA: Diagnosis not present

## 2023-02-17 DIAGNOSIS — E785 Hyperlipidemia, unspecified: Secondary | ICD-10-CM | POA: Diagnosis not present

## 2023-02-17 DIAGNOSIS — E8729 Other acidosis: Secondary | ICD-10-CM | POA: Diagnosis not present

## 2023-02-17 DIAGNOSIS — F32A Depression, unspecified: Secondary | ICD-10-CM | POA: Diagnosis not present

## 2023-02-20 DIAGNOSIS — R32 Unspecified urinary incontinence: Secondary | ICD-10-CM | POA: Diagnosis not present

## 2023-02-21 ENCOUNTER — Ambulatory Visit
Admission: RE | Admit: 2023-02-21 | Discharge: 2023-02-21 | Disposition: A | Discharge status: Home / Self Care | Payer: Medicare HMO | Source: Ambulatory Visit | Attending: Family Medicine | Admitting: Family Medicine

## 2023-02-21 ENCOUNTER — Telehealth: Payer: Self-pay

## 2023-02-21 ENCOUNTER — Other Ambulatory Visit: Payer: Self-pay | Admitting: Family Medicine

## 2023-02-21 DIAGNOSIS — R928 Other abnormal and inconclusive findings on diagnostic imaging of breast: Secondary | ICD-10-CM

## 2023-02-21 DIAGNOSIS — N6310 Unspecified lump in the right breast, unspecified quadrant: Secondary | ICD-10-CM

## 2023-02-21 DIAGNOSIS — N6311 Unspecified lump in the right breast, upper outer quadrant: Secondary | ICD-10-CM | POA: Diagnosis not present

## 2023-02-21 NOTE — Telephone Encounter (Signed)
Reason for WGN:FAOZHYQ is calling because she was referred to have a breast biopsy; however, before going through with it she would like to Dr.Cox's advise and recommendations. Best call back number is [phone].   Called patient, she stated that she wanted provider to look at the diagnotic mammogram she had today and if she agrees with nher needing a biopsy done, stated her breast is not bothering her or anything and wanted to know if provider thought it was necessary to get it done. Please advise

## 2023-02-21 NOTE — Telephone Encounter (Signed)
I would strongly recommend patient proceed with biopsy to determine the nature of this mass. I wrote this on her mammogram results. Dr. Sedalia Muta

## 2023-02-22 NOTE — Telephone Encounter (Signed)
Called and left detailed message fro patient

## 2023-02-22 NOTE — Telephone Encounter (Signed)
Copied from CRM [id number]. Topic: Clinical - Medication Question >> Feb 22, 2023  9:24 AM Donita Brooks wrote: Reason for CRM: Pt has a really bad cough, pt think it may be bronchitis, pt would like to know if Dr. Sedalia Muta can prescribe her something before she goes to her appointment next week

## 2023-02-24 DIAGNOSIS — J189 Pneumonia, unspecified organism: Secondary | ICD-10-CM | POA: Diagnosis not present

## 2023-02-24 DIAGNOSIS — R0981 Nasal congestion: Secondary | ICD-10-CM | POA: Diagnosis not present

## 2023-02-24 DIAGNOSIS — R051 Acute cough: Secondary | ICD-10-CM | POA: Diagnosis not present

## 2023-02-26 DIAGNOSIS — J1569 Pneumonia due to other gram-negative bacteria: Secondary | ICD-10-CM | POA: Diagnosis not present

## 2023-02-26 DIAGNOSIS — G4700 Insomnia, unspecified: Secondary | ICD-10-CM | POA: Diagnosis not present

## 2023-02-26 DIAGNOSIS — J470 Bronchiectasis with acute lower respiratory infection: Secondary | ICD-10-CM | POA: Diagnosis not present

## 2023-02-26 DIAGNOSIS — I10 Essential (primary) hypertension: Secondary | ICD-10-CM | POA: Diagnosis not present

## 2023-02-26 DIAGNOSIS — E1142 Type 2 diabetes mellitus with diabetic polyneuropathy: Secondary | ICD-10-CM | POA: Diagnosis not present

## 2023-02-26 DIAGNOSIS — J9811 Atelectasis: Secondary | ICD-10-CM | POA: Diagnosis not present

## 2023-02-26 DIAGNOSIS — J9601 Acute respiratory failure with hypoxia: Secondary | ICD-10-CM | POA: Diagnosis not present

## 2023-02-26 DIAGNOSIS — G2581 Restless legs syndrome: Secondary | ICD-10-CM | POA: Diagnosis not present

## 2023-02-26 DIAGNOSIS — E785 Hyperlipidemia, unspecified: Secondary | ICD-10-CM | POA: Diagnosis not present

## 2023-02-26 DIAGNOSIS — J189 Pneumonia, unspecified organism: Secondary | ICD-10-CM | POA: Diagnosis not present

## 2023-02-26 DIAGNOSIS — R0602 Shortness of breath: Secondary | ICD-10-CM | POA: Diagnosis not present

## 2023-02-26 DIAGNOSIS — R911 Solitary pulmonary nodule: Secondary | ICD-10-CM | POA: Diagnosis not present

## 2023-02-26 DIAGNOSIS — A419 Sepsis, unspecified organism: Secondary | ICD-10-CM | POA: Diagnosis not present

## 2023-02-27 ENCOUNTER — Telehealth: Payer: Self-pay

## 2023-02-27 DIAGNOSIS — J9601 Acute respiratory failure with hypoxia: Secondary | ICD-10-CM | POA: Diagnosis not present

## 2023-02-27 DIAGNOSIS — J470 Bronchiectasis with acute lower respiratory infection: Secondary | ICD-10-CM | POA: Diagnosis not present

## 2023-02-27 NOTE — Telephone Encounter (Signed)
Copied from CRM [id number]. Topic: General - Other >> Feb 27, 2023  9:43 AM Clayton Bibles wrote: Reason for CRM: Hailey Greene is currently in the [facility] with pneumonia. Spouse wanted Dr. Sedalia Muta to know.

## 2023-02-28 ENCOUNTER — Other Ambulatory Visit: Payer: Medicare HMO

## 2023-02-28 DIAGNOSIS — J9601 Acute respiratory failure with hypoxia: Secondary | ICD-10-CM | POA: Diagnosis not present

## 2023-02-28 DIAGNOSIS — J470 Bronchiectasis with acute lower respiratory infection: Secondary | ICD-10-CM | POA: Diagnosis not present

## 2023-03-01 DIAGNOSIS — J9601 Acute respiratory failure with hypoxia: Secondary | ICD-10-CM | POA: Diagnosis not present

## 2023-03-01 DIAGNOSIS — J470 Bronchiectasis with acute lower respiratory infection: Secondary | ICD-10-CM | POA: Diagnosis not present

## 2023-03-02 DIAGNOSIS — R918 Other nonspecific abnormal finding of lung field: Secondary | ICD-10-CM | POA: Diagnosis not present

## 2023-03-03 DIAGNOSIS — R918 Other nonspecific abnormal finding of lung field: Secondary | ICD-10-CM | POA: Diagnosis not present

## 2023-03-03 DIAGNOSIS — R0602 Shortness of breath: Secondary | ICD-10-CM | POA: Diagnosis not present

## 2023-03-03 DIAGNOSIS — Z79899 Other long term (current) drug therapy: Secondary | ICD-10-CM | POA: Diagnosis not present

## 2023-03-03 DIAGNOSIS — J479 Bronchiectasis, uncomplicated: Secondary | ICD-10-CM | POA: Diagnosis not present

## 2023-03-03 DIAGNOSIS — J471 Bronchiectasis with (acute) exacerbation: Secondary | ICD-10-CM | POA: Diagnosis not present

## 2023-03-03 DIAGNOSIS — Z7952 Long term (current) use of systemic steroids: Secondary | ICD-10-CM | POA: Diagnosis not present

## 2023-03-03 DIAGNOSIS — Z7982 Long term (current) use of aspirin: Secondary | ICD-10-CM | POA: Diagnosis not present

## 2023-03-03 DIAGNOSIS — R591 Generalized enlarged lymph nodes: Secondary | ICD-10-CM | POA: Diagnosis not present

## 2023-03-03 DIAGNOSIS — J810 Acute pulmonary edema: Secondary | ICD-10-CM | POA: Diagnosis not present

## 2023-03-03 DIAGNOSIS — J9601 Acute respiratory failure with hypoxia: Secondary | ICD-10-CM | POA: Diagnosis not present

## 2023-03-03 DIAGNOSIS — J189 Pneumonia, unspecified organism: Secondary | ICD-10-CM | POA: Diagnosis not present

## 2023-03-03 DIAGNOSIS — I10 Essential (primary) hypertension: Secondary | ICD-10-CM | POA: Diagnosis not present

## 2023-03-03 DIAGNOSIS — R062 Wheezing: Secondary | ICD-10-CM | POA: Diagnosis not present

## 2023-03-03 DIAGNOSIS — G2581 Restless legs syndrome: Secondary | ICD-10-CM | POA: Diagnosis not present

## 2023-03-03 DIAGNOSIS — K219 Gastro-esophageal reflux disease without esophagitis: Secondary | ICD-10-CM | POA: Diagnosis not present

## 2023-03-03 DIAGNOSIS — G629 Polyneuropathy, unspecified: Secondary | ICD-10-CM | POA: Diagnosis not present

## 2023-03-04 DIAGNOSIS — R062 Wheezing: Secondary | ICD-10-CM | POA: Diagnosis not present

## 2023-03-04 DIAGNOSIS — J479 Bronchiectasis, uncomplicated: Secondary | ICD-10-CM | POA: Diagnosis not present

## 2023-03-05 DIAGNOSIS — M7989 Other specified soft tissue disorders: Secondary | ICD-10-CM | POA: Diagnosis not present

## 2023-03-07 NOTE — Progress Notes (Addendum)
Subjective:  Patient ID: Hailey Greene, female    DOB: 12/15/1950  Age: 72 y.o. MRN: [id number]  Chief Complaint  Patient presents with   Medical Management of Chronic Issues    HPI History of Present Illness The patient, with a history of bronchiectasis, pneumonia, and diabetes, presents with ongoing shortness of breath and insomnia. She was recently hospitalized twice in the same week for a bronchiectasis exacerbation and pneumonia. Despite completing a course of IV antibiotics (cefepime, vancomycin, and doxycycline) and being discharged on two liters of oxygen and Symbicort, she reports no improvement in her symptoms. She also reports feeling 'really weak and tired,' but denies fever and significant coughing. Currently on symbicort 2 puffs twice daily for asthma.   The patient's diabetes is well-controlled, with blood sugars ranging from 110 to 122. She is currently taking Rybelsus for diabetes management. She also reports numbness and burning in her feet, which is a common complication of diabetes.  The patient also reports difficulty sleeping, despite taking Lunesta. She attributes her insomnia to her recent illness. She also takes phentermine, Lyrica, and Requip for other conditions.   Diabetes:  Complications: hypertension, neuropathy. Glucose checking: Daily Glucose logs:110-122 Hypoglycemia: No Most recent A1C: 6.0% Current medications: Rybelsus 14 mg daily. Last Eye Exam: 08/05/2021 Foot checks:Daily  Hyperlipidemia: Current medications: Vascepa 2g twice a day, Rosuvastatin 40 mg daily, zetia 10 mg daily  Hypertension: Current medications: Ramipril 2.5 mg daily.  Depression: Taking Trintellix 20 mg daily.  GERD: Takes Omeprazole 20 mg daily IBS: on bentyl: takes twice daily usually  Rest legs syndrome: Ropinirole 3 mg at bedtime.  Incontinence urinary: Taking Vesicare 5 mg daily.     03/08/2023    8:51 AM 11/16/2022    8:58 AM 07/29/2022    9:16 AM 03/02/2022     3:45 PM 12/29/2021    1:16 PM  Depression screen PHQ 2/9  Decreased Interest 0 0 0 0 0  Down, Depressed, Hopeless 0 1 1 0 0  PHQ - 2 Score 0 1 1 0 0  Altered sleeping 0 2 0 0   Tired, decreased energy 0 2 0 1   Change in appetite 0 2 0 0   Feeling bad or failure about yourself  0 0 0 0   Trouble concentrating 0 0 0 0   Moving slowly or fidgety/restless 0 2 0    Suicidal thoughts 0 0 0 0   PHQ-9 Score 0 9 1 1    Difficult doing work/chores Not difficult at all Somewhat difficult Not difficult at all Not difficult at all         11/16/2022    8:57 AM  Fall Risk   Falls in the past year? 1  Number falls in past yr: 1  Injury with Fall? 1  Risk for fall due to : Impaired balance/gait  Follow up Falls evaluation completed;Falls prevention discussed    Patient Care Team: Blane Ohara, MD as PCP - General (Family Medicine) Dannielle Huh, MD as Consulting Physician (Orthopedic Surgery)   Review of Systems  Constitutional:  Positive for fatigue. Negative for chills and fever.  HENT:  Negative for congestion, ear pain, rhinorrhea and sore throat.   Respiratory:  Positive for shortness of breath. Negative for cough.   Cardiovascular:  Negative for chest pain.  Gastrointestinal:  Positive for diarrhea. Negative for abdominal pain, constipation, nausea and vomiting.  Genitourinary:  Negative for dysuria and urgency.  Musculoskeletal:  Negative for back pain and myalgias.  Neurological:  Negative for dizziness, weakness, light-headedness and headaches.  Psychiatric/Behavioral:  Negative for dysphoric mood. The patient is not nervous/anxious.     Current Outpatient Medications on File Prior to Visit  Medication Sig Dispense Refill   ASPIRIN 81 PO Take 81 mg by mouth daily.     blood glucose meter kit and supplies Dispense based on patient and insurance preference. Use up to four times daily as directed. (FOR ICD-10 E10.9, E11.9). 1 each 0   Blood Glucose Monitoring Suppl (ONETOUCH  VERIO) w/Device KIT Use daily to check FBS 1 kit 0   Cyanocobalamin (VITAMIN B-12) 2500 MCG SUBL DISSOLVE 1 TABLET UNDER THE TONGUE DAILY 90 tablet 3   glucose blood (ONETOUCH VERIO) test strip E11.42 Use new test strip each time when checking FBS 100 each 2   glucose blood test strip Use to test blood sugar once daily as directed 100 each 0   Lancets (ONETOUCH DELICA PLUS LANCET33G) MISC E 11.42 Use new lancet each time when checking FBS 100 each 2   Lancets 33G MISC Use to check blood sugar once daily as directed (Patient not taking: Reported on 02/13/2023) 100 each 0   Multiple Vitamin (MULTIVITAMIN WITH MINERALS) TABS tablet Take 1 tablet by mouth daily.     omeprazole (PRILOSEC) 20 MG capsule Take 1 capsule (20 mg total) by mouth daily. (Patient not taking: Reported on 02/13/2023) 240 capsule 0   triamcinolone cream (KENALOG) 0.1 % APPLY 1 APPLICATION TOPICALLY TWICE DAILY 80 g 10   No current facility-administered medications on file prior to visit.   Past Medical History:  Diagnosis Date   2019 novel coronavirus disease (COVID-19) 12/31/2018   Acute hypoxemic respiratory failure due to COVID-19 (HCC)    Chicken pox    Depression    Diabetes (HCC)    GERD (gastroesophageal reflux disease)    Hypertension    Idiopathic progressive neuropathy    Major depressive disorder    Mixed hyperlipidemia    Neuropathy    Osteoarthritis    Pneumonia    Primary insomnia    Primary osteoarthritis of left knee 05/31/2019   RLS (restless legs syndrome)    S/P total knee replacement 07/15/2019   Status post total knee replacement 07/25/2019   Tachycardia    Urge incontinence    UTI (lower urinary tract infection)    Weakness    Past Surgical History:  Procedure Laterality Date   ABDOMINAL HYSTERECTOMY     APPENDECTOMY     CHOLECYSTECTOMY     HERNIA REPAIR     REPLACEMENT TOTAL KNEE Right    TONSILLECTOMY     TOTAL KNEE ARTHROPLASTY Left 07/15/2019   Procedure: TOTAL KNEE ARTHROPLASTY;   Surgeon: Dannielle Huh, MD;  Location: [facility];  Service: Orthopedics;  Laterality: Left;    Family History  Problem Relation Age of Onset   Thyroid disease Mother    Cancer Mother    Migraines Mother    Brain cancer Father    Heart disease Maternal Grandmother    Diabetes Daughter    Social History   Socioeconomic History   Marital status: Married    Spouse name: Not on file   Number of children: 3   Years of education: 12   Highest education level: Not on file  Occupational History   Occupation: Housewife  Tobacco Use   Smoking status: Former    Current packs/day: 0.00    Average packs/day: 2.0 packs/day for 12.0 years (24.0 ttl pk-yrs)  Types: Cigarettes    Start date: 05/25/1998    Quit date: 05/25/2010    Years since quitting: 12.8   Smokeless tobacco: Never  Vaping Use   Vaping status: Never Used  Substance and Sexual Activity   Alcohol use: Yes    Comment: occasional   Drug use: No   Sexual activity: Not on file  Other Topics Concern   Not on file  Social History Narrative   Born and raised in [place], [place]. Currently lives in a house with her husband. 1 dog. Fun: Garden, feed birds, swimming   Denies any religious beliefs effecting health care.    Social Drivers of Corporate investment banker Strain: Low Risk  (03/08/2023)   Overall Financial Resource Strain (CARDIA)    Difficulty of Paying Living Expenses: Not hard at all  Food Insecurity: No Food Insecurity (03/08/2023)   Hunger Vital Sign    Worried About Running Out of Food in the Last Year: Never true    Ran Out of Food in the Last Year: Never true  Transportation Needs: No Transportation Needs (03/08/2023)   PRAPARE - Administrator, Civil Service (Medical): No    Lack of Transportation (Non-Medical): No  Physical Activity: Insufficiently Active (03/08/2023)   Exercise Vital Sign    Days of Exercise per Week: 7 days    Minutes of Exercise per Session: 20 min  Stress: No Stress Concern  Present (03/08/2023)   [name] of Occupational Health - Occupational Stress Questionnaire    Feeling of Stress : Not at all  Social Connections: Moderately Integrated (03/08/2023)   Social Connection and Isolation Panel [NHANES]    Frequency of Communication with Friends and Family: More than three times a week    Frequency of Social Gatherings with Friends and Family: More than three times a week    Attends Religious Services: More than 4 times per year    Active Member of [employer] or Organizations: No    Attends Engineer, structural: Never    Marital Status: Married    Objective:  BP 132/78   Pulse 78   Temp (!) 97.5 F (36.4 C)   Ht 5\' 3"  (1.6 m)   Wt 183 lb (83 kg)   SpO2 98%   BMI 32.42 kg/m      03/09/2023    9:44 AM 03/08/2023    8:42 AM 02/13/2023    4:10 PM  BP/Weight  Systolic BP 124 132 156  Diastolic BP 74 78 77  Wt. (Lbs) 197 183   BMI 34.9 kg/m2 32.42 kg/m2     Physical Exam Vitals reviewed.  Constitutional:      Appearance: Normal appearance. She is normal weight.  HENT:     Right Ear: Tympanic membrane normal.     Left Ear: Tympanic membrane normal.     Nose: No congestion or rhinorrhea.     Mouth/Throat:     Pharynx: No oropharyngeal exudate or posterior oropharyngeal erythema.  Neck:     Vascular: No carotid bruit.  Cardiovascular:     Rate and Rhythm: Normal rate and regular rhythm.     Heart sounds: Normal heart sounds.  Pulmonary:     Effort: Pulmonary effort is normal. No respiratory distress.     Breath sounds: Wheezing present.  Abdominal:     General: Abdomen is flat. Bowel sounds are normal.     Palpations: Abdomen is soft.     Tenderness: There is no abdominal tenderness.  Neurological:     Mental Status: She is alert and oriented to person, place, and time.  Psychiatric:        Mood and Affect: Mood normal.        Behavior: Behavior normal.     Diabetic Foot Exam - Simple   Simple Foot Form Diabetic Foot  exam was performed with the following findings: Yes 03/07/2023  7:25 PM  Visual Inspection No deformities, no ulcerations, no other skin breakdown bilaterally: Yes Sensation Testing Pulse Check Comments      Lab Results  Component Value Date   WBC 15.4 (H) 03/08/2023   HGB 15.2 03/08/2023   HCT 46.1 03/08/2023   PLT 375 03/08/2023   GLUCOSE 106 (H) 03/08/2023   CHOL 158 03/08/2023   TRIG 234 (H) 03/08/2023   HDL 62 03/08/2023   LDLCALC 59 03/08/2023   ALT 24 03/08/2023   AST 14 03/08/2023   NA 135 03/08/2023   K 4.8 03/08/2023   CL 95 (L) 03/08/2023   CREATININE 0.85 03/08/2023   BUN 16 03/08/2023   CO2 21 03/08/2023   TSH 2.690 11/16/2022   HGBA1C 5.9 (H) 03/08/2023   MICROALBUR 30 09/25/2020      Assessment & Plan:    Asthma, severe persistent, poorly-controlled, with acute exacerbation Assessment & Plan: Recent hospitalizations on 02/26/2023 and 03/03/2023. Completed IV antibiotics (Cefepime, Vancomycin, Doxycycline). Currently on Symbicort and Doxycycline. Still experiencing shortness of breath and fatigue, but cough has improved. No fever, but reports chills. -Administer in-office DuoNeb treatment and reassess. Wheezing improved.  Ordered nebulizer machine with duoneb.  Orders: -     Budesonide-Formoterol Fumarate; Inhale 2 puffs into the lungs 2 (two) times daily.  Dispense: 10.2 g; Refill: 3  Diabetic polyneuropathy associated with type 2 diabetes mellitus (HCC) Assessment & Plan: Blood glucose well-controlled (110-122 range). Last A1c was good. -Continue current management with Rybelsus. -Continue daily foot checks.   Orders: -     Hemoglobin A1c -     Amitriptyline HCl; Take 1 tablet (25 mg total) by mouth every evening.  Dispense: 90 tablet; Refill: 1 -     Pregabalin; Take 1 capsule (300 mg total) by mouth 2 (two) times daily.  Dispense: 60 capsule; Refill: 0 -     Rybelsus; Take 1 tablet (14 mg total) by mouth daily.  Dispense: 90 tablet; Refill:  1  Mixed hyperlipidemia Assessment & Plan: Well controlled.  Continue Vascepa 2 G twice daily, Rosuvastatin 40 mg daily, and Zetia 10 mg daily.   Recommend continue to work on eating healthy diet and exercise.   Orders: -     Lipid panel -     Ezetimibe; Take 1 tablet (10 mg total) by mouth daily.  Dispense: 90 tablet; Refill: 2 -     Icosapent Ethyl; Take 2 capsules (2 g total) by mouth 2 (two) times daily.  Dispense: 360 capsule; Refill: 1 -     Rosuvastatin Calcium; Take 1 tablet (40 mg total) by mouth daily.  Dispense: 90 tablet; Refill: 1  Essential hypertension, benign Assessment & Plan: Well controlled.  Continue Ramipril 2.5 mg daily.  Healthy diet.  Orders: -     CBC with Differential/Platelet -     Comprehensive metabolic panel -     Ramipril; Take 1 capsule (2.5 mg total) by mouth daily.  Dispense: 90 capsule; Refill: 1  Non-seasonal allergic rhinitis due to pollen Assessment & Plan: The current medical regimen is effective;  continue present plan and medications.  Start flonase. Continue claritin.  Orders: -     Fluticasone Propionate; SPRAY 2 SPRAYS INTO EACH NOSTRIL EVERY DAY  Dispense: 48 g; Refill: 2 -     Loratadine; Take 1 tablet (10 mg total) by mouth daily.  Dispense: 90 tablet; Refill: 3  Gastroesophageal reflux disease, unspecified whether esophagitis present Assessment & Plan: The current medical regimen is effective;  continue present plan and medications.  Omeprazole 20 mg daily  Orders: -     Omeprazole; Take 1 capsule (20 mg total) by mouth daily.  Dispense: 90 capsule; Refill: 2  Nausea -     Ondansetron HCl; TAKE 1 TABLET BY MOUTH EVERY 6 HOURS AS NEEDED FOR NAUSEA AND VOMITING  Dispense: 90 tablet; Refill: 10 -     Promethazine HCl; Take 1 tablet (25 mg total) by mouth every 6 (six) hours as needed for nausea or vomiting.  Dispense: 30 tablet; Refill: 0  Other orders -     Eszopiclone; Take 1 tablet (3 mg total) by mouth at bedtime.   Dispense: 30 tablet; Refill: 0 -     Metoclopramide HCl; Take 1 tablet (10 mg total) by mouth every 6 (six) hours as needed for nausea.  Dispense: 15 tablet; Refill: 0 -     Phentermine HCl; Take 1 tablet (37.5 mg total) by mouth every morning.  Dispense: 30 tablet; Refill: 2 -     rOPINIRole HCl; Take 1 tablet (3 mg total) by mouth daily 1-3 hours before bedtime.  Dispense: 90 tablet; Refill: 1 -     Topiramate; Take 1 tablet (50 mg total) by mouth daily.  Dispense: 90 tablet; Refill: 1 -     Vortioxetine HBr; Take 1 tablet (20 mg total) by mouth daily.  Dispense: 90 tablet; Refill: 1 -     Albuterol Sulfate HFA; Inhale 2 puffs into the lungs every 6 (six) hours as needed for wheezing or shortness of breath.  Dispense: 8 g; Refill: 2    Meds ordered this encounter  Medications   amitriptyline (ELAVIL) 25 MG tablet    Sig: Take 1 tablet (25 mg total) by mouth every evening.    Dispense:  90 tablet    Refill:  1    This prescription was filled on 09/07/2022. Any refills authorized will be placed on file.   budesonide-formoterol (SYMBICORT) 160-4.5 MCG/ACT inhaler    Sig: Inhale 2 puffs into the lungs 2 (two) times daily.    Dispense:  10.2 g    Refill:  3   DISCONTD: dicyclomine (BENTYL) 20 MG tablet    Sig: Take 1 tablet (20 mg total) by mouth 4 (four) times daily as needed (intestinal spasms).    Dispense:  360 tablet    Refill:  1    This prescription was filled on 10/07/2022. Any refills authorized will be placed on file.   Eszopiclone 3 MG TABS    Sig: Take 1 tablet (3 mg total) by mouth at bedtime.    Dispense:  30 tablet    Refill:  0   ezetimibe (ZETIA) 10 MG tablet    Sig: Take 1 tablet (10 mg total) by mouth daily.    Dispense:  90 tablet    Refill:  2   fluticasone (FLONASE) 50 MCG/ACT nasal spray    Sig: SPRAY 2 SPRAYS INTO EACH NOSTRIL EVERY DAY    Dispense:  48 g    Refill:  2   icosapent Ethyl (VASCEPA) 1 g capsule  Sig: Take 2 capsules (2 g total) by mouth 2 (two)  times daily.    Dispense:  360 capsule    Refill:  1   loratadine (CLARITIN) 10 MG tablet    Sig: Take 1 tablet (10 mg total) by mouth daily.    Dispense:  90 tablet    Refill:  3   metoCLOPramide (REGLAN) 10 MG tablet    Sig: Take 1 tablet (10 mg total) by mouth every 6 (six) hours as needed for nausea.    Dispense:  15 tablet    Refill:  0   omeprazole (PRILOSEC) 20 MG capsule    Sig: Take 1 capsule (20 mg total) by mouth daily.    Dispense:  90 capsule    Refill:  2   ondansetron (ZOFRAN) 4 MG tablet    Sig: TAKE 1 TABLET BY MOUTH EVERY 6 HOURS AS NEEDED FOR NAUSEA AND VOMITING    Dispense:  90 tablet    Refill:  10   phentermine (ADIPEX-P) 37.5 MG tablet    Sig: Take 1 tablet (37.5 mg total) by mouth every morning.    Dispense:  30 tablet    Refill:  2   pregabalin (LYRICA) 300 MG capsule    Sig: Take 1 capsule (300 mg total) by mouth 2 (two) times daily.    Dispense:  60 capsule    Refill:  0   promethazine (PHENERGAN) 25 MG tablet    Sig: Take 1 tablet (25 mg total) by mouth every 6 (six) hours as needed for nausea or vomiting.    Dispense:  30 tablet    Refill:  0   ramipril (ALTACE) 2.5 MG capsule    Sig: Take 1 capsule (2.5 mg total) by mouth daily.    Dispense:  90 capsule    Refill:  1    Patient changing from [place] to [place]. Patient needs some meds this week and would like them delivered.   rOPINIRole (REQUIP) 3 MG tablet    Sig: Take 1 tablet (3 mg total) by mouth daily 1-3 hours before bedtime.    Dispense:  90 tablet    Refill:  1    This prescription was filled on 10/07/2022. Any refills authorized will be placed on file.   rosuvastatin (CRESTOR) 40 MG tablet    Sig: Take 1 tablet (40 mg total) by mouth daily.    Dispense:  90 tablet    Refill:  1   Semaglutide (RYBELSUS) 14 MG TABS    Sig: Take 1 tablet (14 mg total) by mouth daily.    Dispense:  90 tablet    Refill:  1   DISCONTD: solifenacin (VESICARE) 5 MG tablet    Sig: Take 1 tablet (5  mg total) by mouth daily.    Dispense:  90 tablet    Refill:  1    This prescription was filled on 10/07/2022. Any refills authorized will be placed on file.   topiramate (TOPAMAX) 50 MG tablet    Sig: Take 1 tablet (50 mg total) by mouth daily.    Dispense:  90 tablet    Refill:  1   vortioxetine HBr (TRINTELLIX) 20 MG TABS tablet    Sig: Take 1 tablet (20 mg total) by mouth daily.    Dispense:  90 tablet    Refill:  1    This prescription was filled on 10/07/2022. Any refills authorized will be placed on file.   albuterol (VENTOLIN HFA) 108 (  90 Base) MCG/ACT inhaler    Sig: Inhale 2 puffs into the lungs every 6 (six) hours as needed for wheezing or shortness of breath.    Dispense:  8 g    Refill:  2    Orders Placed This Encounter  Procedures   CBC with Differential/Platelet   Comprehensive metabolic panel   Hemoglobin A1c   Lipid panel     Follow-up: Return in about 2 weeks (around 03/22/2023).   I,Marla I Leal-Borjas,acting as a scribe for Blane Ohara, MD.,have documented all relevant documentation on the behalf of Blane Ohara, MD,as directed by  Blane Ohara, MD while in the presence of Blane Ohara, MD.   An After Visit Summary was printed and given to the patient.  I attest that I have reviewed this visit and agree with the plan scribed by my staff.   Blane Ohara, MD [facility] [phone]

## 2023-03-08 ENCOUNTER — Other Ambulatory Visit: Payer: Self-pay | Admitting: Family Medicine

## 2023-03-08 ENCOUNTER — Ambulatory Visit: Payer: 59 | Admitting: Family Medicine

## 2023-03-08 VITALS — BP 132/78 | HR 78 | Temp 97.5°F | Ht 63.0 in | Wt 183.0 lb

## 2023-03-08 DIAGNOSIS — E782 Mixed hyperlipidemia: Secondary | ICD-10-CM | POA: Diagnosis not present

## 2023-03-08 DIAGNOSIS — J301 Allergic rhinitis due to pollen: Secondary | ICD-10-CM | POA: Diagnosis not present

## 2023-03-08 DIAGNOSIS — J4520 Mild intermittent asthma, uncomplicated: Secondary | ICD-10-CM

## 2023-03-08 DIAGNOSIS — I10 Essential (primary) hypertension: Secondary | ICD-10-CM

## 2023-03-08 DIAGNOSIS — J4551 Severe persistent asthma with (acute) exacerbation: Secondary | ICD-10-CM | POA: Diagnosis not present

## 2023-03-08 DIAGNOSIS — R110 Nausea: Secondary | ICD-10-CM

## 2023-03-08 DIAGNOSIS — K219 Gastro-esophageal reflux disease without esophagitis: Secondary | ICD-10-CM | POA: Diagnosis not present

## 2023-03-08 DIAGNOSIS — E1142 Type 2 diabetes mellitus with diabetic polyneuropathy: Secondary | ICD-10-CM

## 2023-03-08 LAB — CBC WITH DIFFERENTIAL/PLATELET
Basophils Absolute: 0.1 10*3/uL (ref 0.0–0.2)
Basos: 0 %
EOS (ABSOLUTE): 0 10*3/uL (ref 0.0–0.4)
Eos: 0 %
Hematocrit: 46.1 % (ref 34.0–46.6)
Hemoglobin: 15.2 g/dL (ref 11.1–15.9)
Immature Grans (Abs): 0.2 10*3/uL — ABNORMAL HIGH (ref 0.0–0.1)
Immature Granulocytes: 1 %
Lymphocytes Absolute: 4.2 10*3/uL — ABNORMAL HIGH (ref 0.7–3.1)
Lymphs: 27 %
MCH: 29.1 pg (ref 26.6–33.0)
MCHC: 33 g/dL (ref 31.5–35.7)
MCV: 88 fL (ref 79–97)
Monocytes Absolute: 1.1 10*3/uL — ABNORMAL HIGH (ref 0.1–0.9)
Monocytes: 7 %
Neutrophils Absolute: 10 10*3/uL — ABNORMAL HIGH (ref 1.4–7.0)
Neutrophils: 65 %
Platelets: 375 10*3/uL (ref 150–450)
RBC: 5.22 x10E6/uL (ref 3.77–5.28)
RDW: 13.8 % (ref 11.7–15.4)
WBC: 15.4 10*3/uL — ABNORMAL HIGH (ref 3.4–10.8)

## 2023-03-08 LAB — LIPID PANEL
Chol/HDL Ratio: 2.5 {ratio} (ref 0.0–4.4)
Cholesterol, Total: 158 mg/dL (ref 100–199)
HDL: 62 mg/dL (ref >39)
LDL Chol Calc (NIH): 59 mg/dL (ref 0–99)
Triglycerides: 234 mg/dL — ABNORMAL HIGH (ref 0–149)
VLDL Cholesterol Cal: 37 mg/dL (ref 5–40)

## 2023-03-08 LAB — COMPREHENSIVE METABOLIC PANEL
ALT: 24 [IU]/L (ref 0–32)
AST: 14 [IU]/L (ref 0–40)
Albumin: 4.2 g/dL (ref 3.8–4.8)
Alkaline Phosphatase: 87 [IU]/L (ref 44–121)
BUN/Creatinine Ratio: 19 (ref 12–28)
BUN: 16 mg/dL (ref 8–27)
Bilirubin Total: 0.6 mg/dL (ref 0.0–1.2)
CO2: 21 mmol/L (ref 20–29)
Calcium: 9.5 mg/dL (ref 8.7–10.3)
Chloride: 95 mmol/L — ABNORMAL LOW (ref 96–106)
Creatinine, Ser: 0.85 mg/dL (ref 0.57–1.00)
Globulin, Total: 2.3 g/dL (ref 1.5–4.5)
Glucose: 106 mg/dL — ABNORMAL HIGH (ref 70–99)
Potassium: 4.8 mmol/L (ref 3.5–5.2)
Sodium: 135 mmol/L (ref 134–144)
Total Protein: 6.5 g/dL (ref 6.0–8.5)
eGFR: 73 mL/min/{1.73_m2} (ref >59)

## 2023-03-08 LAB — HEMOGLOBIN A1C
Est. average glucose Bld gHb Est-mCnc: 123 mg/dL
Hgb A1c MFr Bld: 5.9 % — ABNORMAL HIGH (ref 4.8–5.6)

## 2023-03-08 MED ORDER — METOCLOPRAMIDE HCL 10 MG PO TABS: 10.0000 mg | ORAL_TABLET | Freq: Four times a day (QID) | ORAL | 0 refills | Status: DC | PRN

## 2023-03-08 MED ORDER — ICOSAPENT ETHYL 1 G PO CAPS: 2.0000 g | ORAL_CAPSULE | Freq: Two times a day (BID) | ORAL | 1 refills | Status: DC

## 2023-03-08 MED ORDER — TOPIRAMATE 50 MG PO TABS: 50.0000 mg | ORAL_TABLET | Freq: Every day | ORAL | 1 refills | Status: DC

## 2023-03-08 MED ORDER — ESZOPICLONE 3 MG PO TABS: 3.0000 mg | ORAL_TABLET | Freq: Every day | ORAL | 0 refills | Status: DC

## 2023-03-08 MED ORDER — FLUTICASONE PROPIONATE 50 MCG/ACT NA SUSP: NASAL | 2 refills | Status: DC

## 2023-03-08 MED ORDER — BUDESONIDE-FORMOTEROL FUMARATE 160-4.5 MCG/ACT IN AERO: 2.0000 | INHALATION_SPRAY | Freq: Two times a day (BID) | RESPIRATORY_TRACT | 3 refills | Status: DC

## 2023-03-08 MED ORDER — ONDANSETRON HCL 4 MG PO TABS: ORAL_TABLET | ORAL | 10 refills | Status: DC

## 2023-03-08 MED ORDER — PROMETHAZINE HCL 25 MG PO TABS: 25.0000 mg | ORAL_TABLET | Freq: Four times a day (QID) | ORAL | 0 refills | Status: DC | PRN

## 2023-03-08 MED ORDER — ROPINIROLE HCL 3 MG PO TABS: 3.0000 mg | ORAL_TABLET | Freq: Every day | ORAL | 1 refills | Status: DC

## 2023-03-08 MED ORDER — VORTIOXETINE HBR 20 MG PO TABS: 20.0000 mg | ORAL_TABLET | Freq: Every day | ORAL | 1 refills | Status: DC

## 2023-03-08 MED ORDER — OMEPRAZOLE 20 MG PO CPDR: 20.0000 mg | DELAYED_RELEASE_CAPSULE | Freq: Every day | ORAL | 2 refills | Status: DC

## 2023-03-08 MED ORDER — PREGABALIN 300 MG PO CAPS: 300.0000 mg | ORAL_CAPSULE | Freq: Two times a day (BID) | ORAL | 0 refills | Status: DC

## 2023-03-08 MED ORDER — ALBUTEROL SULFATE HFA 108 (90 BASE) MCG/ACT IN AERS: 2.0000 | INHALATION_SPRAY | Freq: Four times a day (QID) | RESPIRATORY_TRACT | 2 refills | Status: DC | PRN

## 2023-03-08 MED ORDER — EZETIMIBE 10 MG PO TABS: 10.0000 mg | ORAL_TABLET | Freq: Every day | ORAL | 2 refills | Status: DC

## 2023-03-08 MED ORDER — RAMIPRIL 2.5 MG PO CAPS: 2.5000 mg | ORAL_CAPSULE | Freq: Every day | ORAL | 1 refills | Status: DC

## 2023-03-08 MED ORDER — SOLIFENACIN SUCCINATE 5 MG PO TABS: 5.0000 mg | ORAL_TABLET | Freq: Every day | ORAL | 1 refills | Status: DC

## 2023-03-08 MED ORDER — LORATADINE 10 MG PO TABS: 10.0000 mg | ORAL_TABLET | Freq: Every day | ORAL | 3 refills | Status: DC

## 2023-03-08 MED ORDER — DICYCLOMINE HCL 20 MG PO TABS: 20.0000 mg | ORAL_TABLET | Freq: Four times a day (QID) | ORAL | 1 refills | Status: DC | PRN

## 2023-03-08 MED ORDER — PHENTERMINE HCL 37.5 MG PO TABS: 37.5000 mg | ORAL_TABLET | Freq: Every morning | ORAL | 2 refills | Status: DC

## 2023-03-08 MED ORDER — ROSUVASTATIN CALCIUM 40 MG PO TABS: 40.0000 mg | ORAL_TABLET | Freq: Every day | ORAL | 1 refills | Status: DC

## 2023-03-08 MED ORDER — RYBELSUS 14 MG PO TABS: 1.0000 | ORAL_TABLET | Freq: Every day | ORAL | 1 refills | Status: DC

## 2023-03-08 MED ORDER — AMITRIPTYLINE HCL 25 MG PO TABS: 25.0000 mg | ORAL_TABLET | Freq: Every evening | ORAL | 1 refills | Status: DC

## 2023-03-08 NOTE — Patient Instructions (Addendum)
VISIT SUMMARY:  During today's visit, we discussed your ongoing shortness of breath and insomnia following your recent hospitalizations for bronchiectasis exacerbation and pneumonia. We also reviewed your diabetes management, sleep issues, and other health concerns.  YOUR PLAN:  -BRONCHIECTASIS EXACERBATION AND PNEUMONIA: Bronchiectasis is a condition where the airways in your lungs become damaged, making it hard to clear mucus. Pneumonia is an infection that inflames the air sacs in one or both lungs. You recently completed a course of IV antibiotics and are currently on Symbicort and have completed your antibiotic, Doxycycline. We administered an in-office DuoNeb treatment today and will work on getting you a home nebulizer if it proves beneficial.  -INSOMNIA: Insomnia is difficulty falling or staying asleep. We discussed your current sleep hygiene practices and may adjust your Lunesta dose or explore alternative sleep aids if necessary.  -DIABETES: Diabetes is a condition that affects how your body processes blood sugar. Your blood glucose levels are well-controlled, and you should continue your current management with Rybelsus and daily foot checks.  -HYPERLIPIDEMIA: Hyperlipidemia is having high levels of fats in your blood. Continue your current medications: Vascepa, Rosuvastatin, and Zetia.  -DEPRESSION/ANXIETY: Depression and anxiety are mental health conditions that affect your mood and feelings. Your condition is well-controlled with Trintellix, so continue your current management.  -PERIPHERAL NEUROPATHY: Peripheral neuropathy is a result of damage to the nerves outside of your brain and spinal cord, often causing numbness and pain in your hands and feet. Continue your current management with Lyrica and Requip.  -GENERAL HEALTH MAINTENANCE: Continue taking Ramipril for hypertension and Phentermine as prescribed. Ensure you follow up with your eye doctor in February 2025. We will also  draw blood for routine labs, including A1c.  INSTRUCTIONS:  Please follow up with your eye doctor in February 2025. We will also draw blood for routine labs, including A1c.  Use albuterol inhaler 2 puffs four times a day x 48 hours, then four times a day as needed shortness of breath. Nebulizer treatments are interchangeable with albuterol inhaler.

## 2023-03-09 ENCOUNTER — Ambulatory Visit: Payer: Self-pay | Admitting: Family Medicine

## 2023-03-09 ENCOUNTER — Encounter: Payer: Self-pay | Admitting: Family Medicine

## 2023-03-09 ENCOUNTER — Ambulatory Visit (INDEPENDENT_AMBULATORY_CARE_PROVIDER_SITE_OTHER): Payer: Medicare HMO | Admitting: Family Medicine

## 2023-03-09 ENCOUNTER — Telehealth: Payer: Self-pay

## 2023-03-09 VITALS — BP 124/74 | HR 100 | Temp 97.6°F | Ht 63.0 in | Wt 197.0 lb

## 2023-03-09 DIAGNOSIS — N3941 Urge incontinence: Secondary | ICD-10-CM | POA: Diagnosis not present

## 2023-03-09 DIAGNOSIS — R110 Nausea: Secondary | ICD-10-CM

## 2023-03-09 DIAGNOSIS — J471 Bronchiectasis with (acute) exacerbation: Secondary | ICD-10-CM

## 2023-03-09 DIAGNOSIS — J4551 Severe persistent asthma with (acute) exacerbation: Secondary | ICD-10-CM | POA: Insufficient documentation

## 2023-03-09 DIAGNOSIS — K588 Other irritable bowel syndrome: Secondary | ICD-10-CM

## 2023-03-09 MED ORDER — SPACER/AERO-HOLDING CHAMBERS DEVI: 0 refills | Status: DC

## 2023-03-09 MED ORDER — AMOXICILLIN 875 MG PO TABS: 875.0000 mg | ORAL_TABLET | Freq: Two times a day (BID) | ORAL | 0 refills | Status: AC

## 2023-03-09 MED ORDER — AZITHROMYCIN 250 MG PO TABS: ORAL_TABLET | ORAL | 0 refills | Status: AC

## 2023-03-09 MED ORDER — PREDNISONE 20 MG PO TABS: ORAL_TABLET | ORAL | 0 refills | Status: AC

## 2023-03-09 MED ORDER — IPRATROPIUM-ALBUTEROL 0.5-2.5 (3) MG/3ML IN SOLN: 3.0000 mL | Freq: Four times a day (QID) | RESPIRATORY_TRACT | 0 refills | Status: DC | PRN

## 2023-03-09 NOTE — Assessment & Plan Note (Signed)
Well controlled.  Continue Vascepa 2 G twice daily, Rosuvastatin 40 mg daily, and Zetia 10 mg daily.   Recommend continue to work on eating healthy diet and exercise.

## 2023-03-09 NOTE — Assessment & Plan Note (Signed)
Blood glucose well-controlled (110-122 range). Last A1c was good. -Continue current management with Rybelsus. -Continue daily foot checks.

## 2023-03-09 NOTE — Telephone Encounter (Signed)
Form has been filled out for [facility]

## 2023-03-09 NOTE — Assessment & Plan Note (Signed)
Recent hospitalizations on 02/26/2023 and 03/03/2023. Completed IV antibiotics (Cefepime, Vancomycin, Doxycycline). Currently on Symbicort and Doxycycline. Still experiencing shortness of breath and fatigue, but cough has improved. No fever, but reports chills. -Administer in-office DuoNeb treatment and reassess. Wheezing improved.  Ordered nebulizer machine with duoneb.

## 2023-03-09 NOTE — Telephone Encounter (Signed)
Copied from CRM [id number]. Topic: General - Other >> Mar 09, 2023 11:10 AM Larwance Sachs wrote: Reason for CRM: Lurena Joiner called in from [facility] regarding a  nebulizer machine needed for patient. Stated patients insurance is not in network with them but does want to advise that [facility] is in network with patients insurance

## 2023-03-09 NOTE — Assessment & Plan Note (Signed)
The current medical regimen is effective; continue present plan and medications. Omeprazole 20 mg daily. 

## 2023-03-09 NOTE — Progress Notes (Unsigned)
Subjective:  Patient ID: Hailey Greene, female    DOB: 19-Jun-1950  Age: 72 y.o. MRN: [id number]  Chief Complaint  Patient presents with   Shortness of Breath    HPI  Patient presents in follow up since yesterday. She continues to feel poorly. She feels nauseous and continues to feel shortness of breath. She presents with persistent respiratory symptoms despite recent hospitalization and antibiotic therapy. She reports feeling 'beat' and 'not feeling any better,' but denies new symptoms or worsening of her condition. She completed a course of antibiotics (doxycycline) and prednisone, which she finished last week. She also reports stomach discomfort, which may be related to her medication regimen. She has used ventolin inhaler 4 times since her an appointment. She is still waiting on nebulizer machine.   In addition to her respiratory symptoms, the patient has been experiencing nausea, for which she has been prescribed Zofran and Phenergan. She feels the phenergan works better. She also has a history of irritable bowel syndrome, for which she takes dicyclomine twice daily, and a bladder condition, for which she takes Vesicare. Both of these medications require insurance approval.     03/08/2023    8:51 AM 11/16/2022    8:58 AM 07/29/2022    9:16 AM 03/02/2022    3:45 PM 12/29/2021    1:16 PM  Depression screen PHQ 2/9  Decreased Interest 0 0 0 0 0  Down, Depressed, Hopeless 0 1 1 0 0  PHQ - 2 Score 0 1 1 0 0  Altered sleeping 0 2 0 0   Tired, decreased energy 0 2 0 1   Change in appetite 0 2 0 0   Feeling bad or failure about yourself  0 0 0 0   Trouble concentrating 0 0 0 0   Moving slowly or fidgety/restless 0 2 0    Suicidal thoughts 0 0 0 0   PHQ-9 Score 0 9 1 1    Difficult doing work/chores Not difficult at all Somewhat difficult Not difficult at all Not difficult at all         11/16/2022    8:57 AM  Fall Risk   Falls in the past year? 1  Number falls in past yr: 1   Injury with Fall? 1  Risk for fall due to : Impaired balance/gait  Follow up Falls evaluation completed;Falls prevention discussed    Patient Care Team: Blane Ohara, MD as PCP - General (Family Medicine) Dannielle Huh, MD as Consulting Physician (Orthopedic Surgery)   Review of Systems  Constitutional:  Negative for chills, fatigue and fever.  HENT:  Negative for congestion, ear pain, rhinorrhea and sore throat.   Respiratory:  Positive for shortness of breath. Negative for cough.   Cardiovascular:  Negative for chest pain.  Gastrointestinal:  Positive for nausea. Negative for abdominal pain, constipation and diarrhea.    Current Outpatient Medications on File Prior to Visit  Medication Sig Dispense Refill   albuterol (VENTOLIN HFA) 108 (90 Base) MCG/ACT inhaler Inhale 2 puffs into the lungs every 6 (six) hours as needed for wheezing or shortness of breath. 8 g 2   amitriptyline (ELAVIL) 25 MG tablet Take 1 tablet (25 mg total) by mouth every evening. 90 tablet 1   ASPIRIN 81 PO Take 81 mg by mouth daily.     blood glucose meter kit and supplies Dispense based on patient and insurance preference. Use up to four times daily as directed. (FOR ICD-10 E10.9, E11.9). 1 each 0  Blood Glucose Monitoring Suppl (ONETOUCH VERIO) w/Device KIT Use daily to check FBS 1 kit 0   budesonide-formoterol (SYMBICORT) 160-4.5 MCG/ACT inhaler Inhale 2 puffs into the lungs 2 (two) times daily. 10.2 g 3   Cyanocobalamin (VITAMIN B-12) 2500 MCG SUBL DISSOLVE 1 TABLET UNDER THE TONGUE DAILY 90 tablet 3   dicyclomine (BENTYL) 20 MG tablet TAKE 1 TABLET (20 MG TOTAL) BY MOUTH 4 (FOUR) TIMES DAILY AS NEEDED (INTESTINAL SPASMS).     Eszopiclone 3 MG TABS Take 1 tablet (3 mg total) by mouth at bedtime. 30 tablet 0   ezetimibe (ZETIA) 10 MG tablet Take 1 tablet (10 mg total) by mouth daily. 90 tablet 2   fluticasone (FLONASE) 50 MCG/ACT nasal spray SPRAY 2 SPRAYS INTO EACH NOSTRIL EVERY DAY 48 g 2   glucose blood  (ONETOUCH VERIO) test strip E11.42 Use new test strip each time when checking FBS 100 each 2   glucose blood test strip Use to test blood sugar once daily as directed 100 each 0   icosapent Ethyl (VASCEPA) 1 g capsule Take 2 capsules (2 g total) by mouth 2 (two) times daily. 360 capsule 1   ipratropium-albuterol (DUONEB) 0.5-2.5 (3) MG/3ML SOLN Take 3 mLs by nebulization every 6 (six) hours as needed (SOB. WHEEZING). 360 mL 0   Lancets (ONETOUCH DELICA PLUS LANCET33G) MISC E 11.42 Use new lancet each time when checking FBS 100 each 2   Lancets 33G MISC Use to check blood sugar once daily as directed (Patient not taking: Reported on 02/13/2023) 100 each 0   loratadine (CLARITIN) 10 MG tablet Take 1 tablet (10 mg total) by mouth daily. 90 tablet 3   metoCLOPramide (REGLAN) 10 MG tablet Take 1 tablet (10 mg total) by mouth every 6 (six) hours as needed for nausea. 15 tablet 0   Multiple Vitamin (MULTIVITAMIN WITH MINERALS) TABS tablet Take 1 tablet by mouth daily.     omeprazole (PRILOSEC) 20 MG capsule Take 1 capsule (20 mg total) by mouth daily. (Patient not taking: Reported on 02/13/2023) 240 capsule 0   omeprazole (PRILOSEC) 20 MG capsule Take 1 capsule (20 mg total) by mouth daily. 90 capsule 2   ondansetron (ZOFRAN) 4 MG tablet TAKE 1 TABLET BY MOUTH EVERY 6 HOURS AS NEEDED FOR NAUSEA AND VOMITING 90 tablet 10   phentermine (ADIPEX-P) 37.5 MG tablet Take 1 tablet (37.5 mg total) by mouth every morning. 30 tablet 2   pregabalin (LYRICA) 300 MG capsule Take 1 capsule (300 mg total) by mouth 2 (two) times daily. 60 capsule 0   promethazine (PHENERGAN) 25 MG tablet Take 1 tablet (25 mg total) by mouth every 6 (six) hours as needed for nausea or vomiting. 30 tablet 0   ramipril (ALTACE) 2.5 MG capsule Take 1 capsule (2.5 mg total) by mouth daily. 90 capsule 1   rOPINIRole (REQUIP) 3 MG tablet Take 1 tablet (3 mg total) by mouth daily 1-3 hours before bedtime. 90 tablet 1   rosuvastatin (CRESTOR) 40 MG  tablet Take 1 tablet (40 mg total) by mouth daily. 90 tablet 1   Semaglutide (RYBELSUS) 14 MG TABS Take 1 tablet (14 mg total) by mouth daily. 90 tablet 1   solifenacin (VESICARE) 5 MG tablet TAKE 1 TABLET (5 MG TOTAL) BY MOUTH DAILY.     Spacer/Aero-Holding Chambers DEVI USE WITH ALBUTEROL INHALER. 1 each 0   topiramate (TOPAMAX) 50 MG tablet Take 1 tablet (50 mg total) by mouth daily. 90 tablet 1   triamcinolone  cream (KENALOG) 0.1 % APPLY 1 APPLICATION TOPICALLY TWICE DAILY 80 g 10   vortioxetine HBr (TRINTELLIX) 20 MG TABS tablet Take 1 tablet (20 mg total) by mouth daily. 90 tablet 1   No current facility-administered medications on file prior to visit.   Past Medical History:  Diagnosis Date   2019 novel coronavirus disease (COVID-19) 12/31/2018   Acute hypoxemic respiratory failure due to COVID-19 (HCC)    Chicken pox    Depression    Diabetes (HCC)    GERD (gastroesophageal reflux disease)    Hypertension    Idiopathic progressive neuropathy    Major depressive disorder    Mixed hyperlipidemia    Neuropathy    Osteoarthritis    Pneumonia    Primary insomnia    Primary osteoarthritis of left knee 05/31/2019   RLS (restless legs syndrome)    S/P total knee replacement 07/15/2019   Status post total knee replacement 07/25/2019   Tachycardia    Urge incontinence    UTI (lower urinary tract infection)    Weakness    Past Surgical History:  Procedure Laterality Date   ABDOMINAL HYSTERECTOMY     APPENDECTOMY     CHOLECYSTECTOMY     HERNIA REPAIR     REPLACEMENT TOTAL KNEE Right    TONSILLECTOMY     TOTAL KNEE ARTHROPLASTY Left 07/15/2019   Procedure: TOTAL KNEE ARTHROPLASTY;  Surgeon: Dannielle Huh, MD;  Location: [facility];  Service: Orthopedics;  Laterality: Left;    Family History  Problem Relation Age of Onset   Thyroid disease Mother    Cancer Mother    Migraines Mother    Brain cancer Father    Heart disease Maternal Grandmother    Diabetes Daughter    Social  History   Socioeconomic History   Marital status: Married    Spouse name: Not on file   Number of children: 3   Years of education: 12   Highest education level: Not on file  Occupational History   Occupation: Housewife  Tobacco Use   Smoking status: Former    Current packs/day: 0.00    Average packs/day: 2.0 packs/day for 12.0 years (24.0 ttl pk-yrs)    Types: Cigarettes    Start date: 05/25/1998    Quit date: 05/25/2010    Years since quitting: 12.8   Smokeless tobacco: Never  Vaping Use   Vaping status: Never Used  Substance and Sexual Activity   Alcohol use: Yes    Comment: occasional   Drug use: No   Sexual activity: Not on file  Other Topics Concern   Not on file  Social History Narrative   Born and raised in [place], [place]. Currently lives in a house with her husband. 1 dog. Fun: Garden, feed birds, swimming   Denies any religious beliefs effecting health care.    Social Drivers of Corporate investment banker Strain: Low Risk  (03/08/2023)   Overall Financial Resource Strain (CARDIA)    Difficulty of Paying Living Expenses: Not hard at all  Food Insecurity: No Food Insecurity (03/08/2023)   Hunger Vital Sign    Worried About Running Out of Food in the Last Year: Never true    Ran Out of Food in the Last Year: Never true  Transportation Needs: No Transportation Needs (03/08/2023)   PRAPARE - Administrator, Civil Service (Medical): No    Lack of Transportation (Non-Medical): No  Physical Activity: Insufficiently Active (03/08/2023)   Exercise Vital Sign  Days of Exercise per Week: 7 days    Minutes of Exercise per Session: 20 min  Stress: No Stress Concern Present (03/08/2023)   [name] of Occupational Health - Occupational Stress Questionnaire    Feeling of Stress : Not at all  Social Connections: Moderately Integrated (03/08/2023)   Social Connection and Isolation Panel [NHANES]    Frequency of Communication with Friends and Family: More  than three times a week    Frequency of Social Gatherings with Friends and Family: More than three times a week    Attends Religious Services: More than 4 times per year    Active Member of [employer] or Organizations: No    Attends Engineer, structural: Never    Marital Status: Married    Objective:  BP 124/74   Pulse 100   Temp 97.6 F (36.4 C)   Ht 5\' 3"  (1.6 m)   Wt 197 lb (89.4 kg)   SpO2 97%   BMI 34.90 kg/m      03/09/2023    9:44 AM 03/08/2023    8:42 AM 02/13/2023    4:10 PM  BP/Weight  Systolic BP 124 132 156  Diastolic BP 74 78 77  Wt. (Lbs) 197 183   BMI 34.9 kg/m2 32.42 kg/m2     Physical Exam Vitals reviewed.  Constitutional:      Appearance: She is well-developed.  Cardiovascular:     Rate and Rhythm: Normal rate and regular rhythm.     Heart sounds: Normal heart sounds.  Pulmonary:     Breath sounds: Decreased breath sounds and wheezing present. No rhonchi or rales.  Neurological:     Mental Status: She is alert.     Diabetic Foot Exam - Simple   No data filed      Lab Results  Component Value Date   WBC 15.4 (H) 03/08/2023   HGB 15.2 03/08/2023   HCT 46.1 03/08/2023   PLT 375 03/08/2023   GLUCOSE 106 (H) 03/08/2023   CHOL 158 03/08/2023   TRIG 234 (H) 03/08/2023   HDL 62 03/08/2023   LDLCALC 59 03/08/2023   ALT 24 03/08/2023   AST 14 03/08/2023   NA 135 03/08/2023   K 4.8 03/08/2023   CL 95 (L) 03/08/2023   CREATININE 0.85 03/08/2023   BUN 16 03/08/2023   CO2 21 03/08/2023   TSH 2.690 11/16/2022   HGBA1C 5.9 (H) 03/08/2023   MICROALBUR 30 09/25/2020      Assessment & Plan:    Bronchiectasis with acute exacerbation (HCC) Assessment & Plan: Patient reports feeling unwell, but no new symptoms. Lungs sound better than previous day. Completed antibiotics from hospital. High white blood cell count, likely indicating ongoing infection. -Start Amoxicillin.and zpack -Start Prednisone 20mg , 3 tablets daily for 3 days, then 2  tablets daily for 3 days, then 1 tablet daily for 3 days.  Orders: -     Azithromycin; Take 2 tablets on day 1, then 1 tablet daily on days 2 through 5  Dispense: 6 tablet; Refill: 0 -     predniSONE; Take 3 tablets (60 mg total) by mouth daily with breakfast for 3 days, THEN 2 tablets (40 mg total) daily with breakfast for 3 days, THEN 1 tablet (20 mg total) daily with breakfast for 3 days.  Dispense: 18 tablet; Refill: 0  Nausea Assessment & Plan: Patient reports stomach discomfort. -Continue Phenergan as it is preferred by the patient.   Other irritable bowel syndrome Assessment & Plan:  Patient reports taking Dicyclomine twice daily, which is currently requiring insurance authorization. -Work on Clinical biochemist for [employer].   Urge incontinence Assessment & Plan: Patient reports being out of Vesicare, which also requires insurance authorization. -Check for available samples of Vesicare. -Consider alternative bladder medications if necessary.   Other orders -     Amoxicillin; Take 1 tablet (875 mg total) by mouth 2 (two) times daily for 10 days.  Dispense: 20 tablet; Refill: 0    Meds ordered this encounter  Medications   azithromycin (ZITHROMAX) 250 MG tablet    Sig: Take 2 tablets on day 1, then 1 tablet daily on days 2 through 5    Dispense:  6 tablet    Refill:  0   predniSONE (DELTASONE) 20 MG tablet    Sig: Take 3 tablets (60 mg total) by mouth daily with breakfast for 3 days, THEN 2 tablets (40 mg total) daily with breakfast for 3 days, THEN 1 tablet (20 mg total) daily with breakfast for 3 days.    Dispense:  18 tablet    Refill:  0   amoxicillin (AMOXIL) 875 MG tablet    Sig: Take 1 tablet (875 mg total) by mouth 2 (two) times daily for 10 days.    Dispense:  20 tablet    Refill:  0    No orders of the defined types were placed in this encounter.    Follow-up: Return in about 2 weeks (around 03/23/2023) for Dr. Faylene Kurtz.   I,Marla I  Leal-Borjas,acting as a scribe for Blane Ohara, MD.,have documented all relevant documentation on the behalf of Blane Ohara, MD,as directed by  Blane Ohara, MD while in the presence of Blane Ohara, MD.   An After Visit Summary was printed and given to the patient.  I attest that I have reviewed this visit and agree with the plan scribed by my staff.   Blane Ohara, MD [facility] [phone]

## 2023-03-09 NOTE — Assessment & Plan Note (Signed)
Well controlled.  Continue Ramipril 2.5 mg daily.  Healthy diet.

## 2023-03-09 NOTE — Telephone Encounter (Signed)
Copied from CRM [id number]. Topic: Clinical - Red Word Triage >> Mar 09, 2023  8:14 AM Prudencio Pair wrote: Red Word that prompted transfer to Nurse Triage: Patient was just seen by provider yesterday & states that she is not breathing any easier. Wondering if she should go back to hospital?   Chief Complaint: shortness of breath, diagnosed with pneumonia Symptoms: wheezing, coughing  Frequency: ongoing Pertinent Negatives: Patient denies fever Disposition: [] ED /[] Urgent Care (no appt availability in office) / [x] Appointment(In office/virtual)/ []  [facility]/ [] Home Care/ [] Refused Recommended Disposition /[]  Mobile Bus/ []  Follow-up with PCP Additional Notes: Patient was seen in the office yesterday and was prescribed an inhaler for shortness of breath.  She has a diagnosis of pneumonia.  Patient reported that shortness of breath has not improved and is slightly worse.  Patient scheduled for a same day follow up appointment.  Vitals provided by patient:  98.3 F, pulse oximetry 98% heart rate 67.  Advised patient to go to the ER if symptoms worsen prior to appointment time.    Reason for Disposition  [1] Taking antibiotic > 48 hours (2 days) for pneumonia AND [2] breathing not improved  Answer Assessment - Initial Assessment Questions 1. RESPIRATORY STATUS: "Describe your breathing?" (e.g., wheezing, shortness of breath, unable to speak, severe coughing)      Shortness of breath wheezing and coughing; pneumonia 2. ONSET: "When did this breathing problem begin?"      02/26/2023 3. PATTERN "Does the difficult breathing come and go, or has it been constant since it started?"      Constant  4. SEVERITY: "How bad is your breathing?" (e.g., mild, moderate, severe)    - MILD: No SOB at rest, mild SOB with walking, speaks normally in sentences, can lie down, no retractions, pulse < 100.    - MODERATE: SOB at rest, SOB with minimal exertion and prefers to sit, cannot lie down flat,  speaks in phrases, mild retractions, audible wheezing, pulse 100-120.    - SEVERE: Very SOB at rest, speaks in single words, struggling to breathe, sitting hunched forward, retractions, pulse > 120      Severe  6. CARDIAC HISTORY: "Do you have any history of heart disease?" (e.g., heart attack, angina, bypass surgery, angioplasty)      none 7. LUNG HISTORY: "Do you have any history of lung disease?"  (e.g., pulmonary embolus, asthma, emphysema)     none 8. CAUSE: "What do you think is causing the breathing problem?"      Pneumonia  9. OTHER SYMPTOMS: "Do you have any other symptoms? (e.g., dizziness, runny nose, cough, chest pain, fever)     none 10. O2 SATURATION MONITOR:  "Do you use an oxygen saturation monitor (pulse oximeter) at home?" If Yes, ask: "What is your reading (oxygen level) today?" "What is your usual oxygen saturation reading?" (e.g., 95%)       98 % 67 HR  Answer Assessment - Initial Assessment Questions 1. SYMPTOM: "What's the main symptom you're concerned about?" (e.g., breathing difficulty, fever, weakness)     Shortness of breath 2. ONSET: "When did the  shortness of breath  start?"     02/26/23 3. BETTER-SAME-WORSE: "Are you getting better, staying the same, or getting worse compared to the day you were discharged?"     Slightly worse 4. BREATHING DIFFICULTY: "Are you having any difficulty breathing?" If Yes, ask: "How bad is it?"  (e.g., none, mild, moderate, severe)   - MILD: No SOB  at rest, mild SOB with walking, speaks normally in sentences, can lie down, no retractions, pulse < 100.    - MODERATE: SOB at rest, SOB with minimal exertion and prefers to sit, cannot lie down flat, speaks in phrases, mild retractions, audible wheezing, pulse 100-120.    - SEVERE: Very SOB at rest, speaks in single words, struggling to breathe, sitting hunched forward, retractions, pulse > 120      Moderate, shortness of breath at rest 5. FEVER: "Do you have a fever?" If Yes, ask: "What  is your temperature, how was it measured, and when did it start?"     No 7. DIAGNOSIS CONFIRMATION: "When was the pneumonia diagnosed?" "By whom?"     Seen in office yesterday but previously diagnosed  9. OTHER TREATMENT: "Are you receiving any other treatment for the pneumonia?" (e.g., albuterol nebulizer, oxygen) If Yes, ask: "How often?" and "Does it help?"     Inhaler 11. O2 SATURATION MONITOR:  "Do you use an oxygen saturation monitor (pulse oximeter) at home?" If Yes, "What is your reading (oxygen level) today?" "What is your usual oxygen saturation reading?" (e.g., 95%)       98%  Protocols used: Breathing Difficulty-A-AH, Pneumonia Follow-up Call-A-AH

## 2023-03-09 NOTE — Patient Instructions (Addendum)
VISIT SUMMARY:  During today's visit, we discussed your ongoing respiratory symptoms, stomach discomfort, nausea, irritable bowel syndrome, and bladder issues. We reviewed your recent treatments and made adjustments to your medications to help manage your symptoms more effectively.  YOUR PLAN:  -RESPIRATORY INFECTION: A respiratory infection is an illness affecting your lungs and airways. Since you are still feeling unwell and have a high white blood cell count, we are starting you on Amoxicillin, AZITHROMYCIN and a new course of Prednisone. Please take Prednisone 20mg , 3 tablets daily for 3 days, then 2 tablets daily for 3 days, and finally 1 tablet daily for 3 days.  -NAUSEA: Nausea is a feeling of sickness with an inclination to vomit. You should continue taking Phenergan as it helps manage your symptoms. -GENERAL HEALTH MAINTENANCE: We will continue to monitor your cholesterol levels, which have shown significant improvement. We will also check your thyroid function in 6-12 months. Please keep a blood pressure log, and we may consider starting medication if needed.  INSTRUCTIONS:  Please follow the new medication regimen for your respiratory infection and continue taking Phenergan for nausea. 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 Keep monitoring your cholesterol and blood pressure, and we will check your thyroid function in 6-12 months.  Prescriptions for amoxicillin, zithromax, a spacer to use with your albuterol inhaler, and medicine for nebulizer machine.   Hailey Greene is going to call when approved and Hailey Greene can pick up in [place].

## 2023-03-09 NOTE — Assessment & Plan Note (Signed)
The current medical regimen is effective;  continue present plan and medications. Start flonase. Continue claritin.

## 2023-03-12 DIAGNOSIS — J479 Bronchiectasis, uncomplicated: Secondary | ICD-10-CM | POA: Insufficient documentation

## 2023-03-12 DIAGNOSIS — J471 Bronchiectasis with (acute) exacerbation: Secondary | ICD-10-CM | POA: Insufficient documentation

## 2023-03-12 DIAGNOSIS — K588 Other irritable bowel syndrome: Secondary | ICD-10-CM | POA: Insufficient documentation

## 2023-03-12 NOTE — Assessment & Plan Note (Signed)
Patient reports stomach discomfort. -Continue Phenergan as it is preferred by the patient.

## 2023-03-12 NOTE — Assessment & Plan Note (Signed)
Patient reports taking Dicyclomine twice daily, which is currently requiring insurance authorization. -Work on Clinical biochemist for [employer].

## 2023-03-12 NOTE — Assessment & Plan Note (Signed)
Patient reports being out of Vesicare, which also requires insurance authorization. -Check for available samples of Vesicare. -Consider alternative bladder medications if necessary.

## 2023-03-12 NOTE — Assessment & Plan Note (Signed)
Patient reports feeling unwell, but no new symptoms. Lungs sound better than previous day. Completed antibiotics from hospital. High white blood cell count, likely indicating ongoing infection. -Start Amoxicillin.and zpack -Start Prednisone 20mg , 3 tablets daily for 3 days, then 2 tablets daily for 3 days, then 1 tablet daily for 3 days.

## 2023-03-13 ENCOUNTER — Encounter: Payer: Self-pay | Admitting: Family Medicine

## 2023-03-20 ENCOUNTER — Other Ambulatory Visit: Payer: Self-pay | Admitting: Family Medicine

## 2023-03-20 NOTE — Telephone Encounter (Signed)
Copied from CRM [id number]. Topic: Clinical - Medication Refill >> Mar 20, 2023  9:15 AM Louie Casa B wrote: Most Recent Primary Care Visit:  Provider: [facility]  Department: [facility]  Visit Type: ACUTE  Date: 03/09/2023  Medication: ***  Has the patient contacted their pharmacy? Yes (Agent: If no, request that the patient contact the pharmacy for the refill. If patient does not wish to contact the pharmacy document the reason why and proceed with request.) (Agent: If yes, when and what did the pharmacy advise?)  Is this the correct pharmacy for this prescription? Yes If no, delete pharmacy and type the correct one.  This is the patient's preferred pharmacy:  [id number] - [place], [place] - [address] [place] [id number] Phone: [phone] Fax: [phone]      Has the prescription been filled recently? No  Is the patient out of the medication? No  Has the patient been seen for an appointment in the last year OR does the patient have an upcoming appointment? Yes  Can we respond through MyChart? Yes  Agent: Please be advised that Rx refills may take up to 3 business days. We ask that you follow-up with your pharmacy.

## 2023-03-21 ENCOUNTER — Ambulatory Visit: Payer: Self-pay | Admitting: Family Medicine

## 2023-03-21 NOTE — Telephone Encounter (Signed)
Copied from CRM [id number]. Topic: Clinical - Red Word Triage >> Mar 21, 2023  8:44 AM Ivette P wrote: Red Word that prompted transfer to Nurse Triage: Patient has oxygen tank and they are picking up today. Worried about going  without    Chief Complaint: Home oxygen company scheduled to pick up equipment today  Additional Notes: Patient sts that she received a text notification advising  that her home oxygen equipment will be picked up today. Sts that she still actively uses the oxygen when her oxygen level is below 90%.  Her most recent use was 2 days ago.  Pt sts that she called PCP on 12/23 for assistance, but has not heard anything back, so she is unsure she if the original order was for temporary use and if a new order was placed. Per chart review, no new orders or notes placed to indicate any progress.  Pt sts that she will call the oxygen company [facility]  [phone]) and request an extension.  RN placed call to [facility], no answer and unable to leave vmail. RN will route to clinic high priority.      Reason for Disposition  [1] Caller requests to speak ONLY to PCP AND [2] URGENT question  Answer Assessment - Initial Assessment Questions 1. REASON FOR CALL or QUESTION: "What is your reason for calling today?" or "How can I best help you?" or "What question do you have that I can help answer?"     Patient asking if PCP was able to speak with home oxygen company to extend her order.   2. CALLER: Document the source of call. (e.g., laboratory, patient).     Patient  Protocols used: PCP Call - No Triage-A-AH

## 2023-03-23 ENCOUNTER — Telehealth: Payer: Self-pay

## 2023-03-23 NOTE — Telephone Encounter (Signed)
Called patient , and left message for patient

## 2023-03-23 NOTE — Telephone Encounter (Signed)
Copied from CRM [id number]. Topic: Clinical - Medical Advice >> Mar 20, 2023  4:35 PM Elle L wrote: Reason for CRM: The patient has an oxygen tank that they are supposed to be picking up tomorrow but wants to see if Dr. Sedalia Muta wants her to keep it. She would like to keep it during the winter months. The patient is requesting a medication for sleep as well. She is having issues falling asleep and staying asleep. The patient's call back number is [phone]. >> Mar 21, 2023  8:36 AM Ivette P wrote: Patient called back in and requesting an update and wants to know if they will come pick up her oxygen tank today. She needs it for the holidays .

## 2023-03-24 ENCOUNTER — Ambulatory Visit: Payer: Medicare HMO

## 2023-03-24 ENCOUNTER — Other Ambulatory Visit: Payer: Self-pay

## 2023-03-24 VITALS — BP 112/62 | HR 76 | Temp 98.1°F | Resp 16 | Ht 63.0 in | Wt 186.0 lb

## 2023-03-24 DIAGNOSIS — J4520 Mild intermittent asthma, uncomplicated: Secondary | ICD-10-CM

## 2023-03-24 DIAGNOSIS — E1142 Type 2 diabetes mellitus with diabetic polyneuropathy: Secondary | ICD-10-CM

## 2023-03-24 DIAGNOSIS — F5101 Primary insomnia: Secondary | ICD-10-CM

## 2023-03-24 MED ORDER — PREGABALIN 300 MG PO CAPS: 300.0000 mg | ORAL_CAPSULE | Freq: Two times a day (BID) | ORAL | 2 refills | Status: DC

## 2023-03-24 MED ORDER — HYDROXYZINE PAMOATE 25 MG PO CAPS: 25.0000 mg | ORAL_CAPSULE | Freq: Every evening | ORAL | 0 refills | Status: DC | PRN

## 2023-03-24 NOTE — Assessment & Plan Note (Signed)
Anxiety and difficulty sleeping despite amitriptyline 25 mg at night. Additional medication needed for acute episodes. Explained hydroxyzine should be used only when necessary to avoid overmedication. - Prescribe hydroxyzine 30 tablets as needed for anxiety and sleep - Instruct to use hydroxyzine only when necessary  General Health Maintenance Requires handicap placard due to neuropathy and respiratory issues. Encouraged to use placard only when necessary to promote physical activity. - Complete and sign handicap placard form - Advise to walk and exercise to improve lung capacity and muscle strength  Follow-up - Schedule follow-up appointment with Dr. Sedalia Muta in two months.

## 2023-03-24 NOTE — Assessment & Plan Note (Signed)
Severe neuropathy in legs causing pain and difficulty walking. Lyrica effective, gabapentin was not. Discussed using handicap placard only when necessary to promote physical activity and muscle strength. - Refill Lyrica 300 mg with a month's supply and additional refills - Instruct pharmacy to dispense Lyrica instead of gabapentin

## 2023-03-24 NOTE — Progress Notes (Signed)
Subjective:  Patient ID: Hailey Greene, female    DOB: April 18, 1950  Age: 72 y.o. MRN: [id number]  Chief Complaint  Patient presents with   Follow-up    HPI  Discussed the use of AI scribe software for clinical note transcription with the patient, who gave verbal consent to proceed.  History of Present Illness          Olegario Messier, a patient with a history of bronchiectasis, presented following a recent exacerbation of her condition, which included pneumonia in both lungs. She reported feeling better since her last visit to Dr. Sedalia Muta in mid-December. Olegario Messier has been using home oxygen, but the prescription from her hospital discharge has expired. She reported that her oxygen saturation was 100% on room air during her current visit, indicating an improvement in her pneumonia and bronchiectasis. However, she noted that her oxygen saturation dropped to 85% while doing laundry. She has had a sleep study within the past year, which showed that her oxygen saturation drops at night.  Olegario Messier also reported struggling with sleep and anxiety. She is currently on amitriptyline (Elavil) for depression and sleep, and Lyrica for neuropathy. She reported that gabapentin, which was prescribed in November, was not effective for her. She requested a refill of her Lyrica prescription and an additional medication to help with sleep and anxiety when needed.  Olegario Messier also mentioned that she has been using a breathing machine for treatments twice a day. She has been experiencing leg pain, particularly in her right leg, and shortness of breath, which limit her ability to walk long distances. This has led her to request a handicap placard.     03/08/2023    8:51 AM 11/16/2022    8:58 AM 07/29/2022    9:16 AM 03/02/2022    3:45 PM 12/29/2021    1:16 PM  Depression screen PHQ 2/9  Decreased Interest 0 0 0 0 0  Down, Depressed, Hopeless 0 1 1 0 0  PHQ - 2 Score 0 1 1 0 0  Altered sleeping 0 2 0 0   Tired, decreased energy 0 2  0 1   Change in appetite 0 2 0 0   Feeling bad or failure about yourself  0 0 0 0   Trouble concentrating 0 0 0 0   Moving slowly or fidgety/restless 0 2 0    Suicidal thoughts 0 0 0 0   PHQ-9 Score 0 9 1 1    Difficult doing work/chores Not difficult at all Somewhat difficult Not difficult at all Not difficult at all         11/16/2022    8:57 AM  Fall Risk   Falls in the past year? 1  Number falls in past yr: 1  Injury with Fall? 1  Risk for fall due to : Impaired balance/gait  Follow up Falls evaluation completed;Falls prevention discussed    Patient Care Team: Blane Ohara, MD as PCP - General (Family Medicine) Dannielle Huh, MD as Consulting Physician (Orthopedic Surgery)   Review of Systems  Constitutional: Negative.   HENT: Negative.    Eyes: Negative.   Respiratory:  Positive for shortness of breath.   Cardiovascular: Negative.   Gastrointestinal: Negative.   Endocrine: Negative.   Genitourinary: Negative.   Musculoskeletal:  Positive for arthralgias and gait problem.  Psychiatric/Behavioral:  Positive for sleep disturbance.     Current Outpatient Medications on File Prior to Visit  Medication Sig Dispense Refill   albuterol (VENTOLIN HFA) 108 (90 Base) MCG/ACT  inhaler Inhale 2 puffs into the lungs every 6 (six) hours as needed for wheezing or shortness of breath. 8 g 2   amitriptyline (ELAVIL) 25 MG tablet Take 1 tablet (25 mg total) by mouth every evening. 90 tablet 1   ASPIRIN 81 PO Take 81 mg by mouth daily.     blood glucose meter kit and supplies Dispense based on patient and insurance preference. Use up to four times daily as directed. (FOR ICD-10 E10.9, E11.9). 1 each 0   Blood Glucose Monitoring Suppl (ONETOUCH VERIO) w/Device KIT Use daily to check FBS 1 kit 0   budesonide-formoterol (SYMBICORT) 160-4.5 MCG/ACT inhaler Inhale 2 puffs into the lungs 2 (two) times daily. 10.2 g 3   Cyanocobalamin (VITAMIN B-12) 2500 MCG SUBL DISSOLVE 1 TABLET UNDER THE TONGUE  DAILY 90 tablet 3   dicyclomine (BENTYL) 20 MG tablet TAKE 1 TABLET (20 MG TOTAL) BY MOUTH 4 (FOUR) TIMES DAILY AS NEEDED (INTESTINAL SPASMS).     Eszopiclone 3 MG TABS Take 1 tablet (3 mg total) by mouth at bedtime. 30 tablet 0   ezetimibe (ZETIA) 10 MG tablet Take 1 tablet (10 mg total) by mouth daily. 90 tablet 2   fluticasone (FLONASE) 50 MCG/ACT nasal spray SPRAY 2 SPRAYS INTO EACH NOSTRIL EVERY DAY 48 g 2   glucose blood (ONETOUCH VERIO) test strip E11.42 Use new test strip each time when checking FBS 100 each 2   glucose blood test strip Use to test blood sugar once daily as directed 100 each 0   icosapent Ethyl (VASCEPA) 1 g capsule Take 2 capsules (2 g total) by mouth 2 (two) times daily. 360 capsule 1   ipratropium-albuterol (DUONEB) 0.5-2.5 (3) MG/3ML SOLN Take 3 mLs by nebulization every 6 (six) hours as needed (SOB. WHEEZING). 360 mL 0   Lancets (ONETOUCH DELICA PLUS LANCET33G) MISC E 11.42 Use new lancet each time when checking FBS 100 each 2   Lancets 33G MISC Use to check blood sugar once daily as directed 100 each 0   loratadine (CLARITIN) 10 MG tablet Take 1 tablet (10 mg total) by mouth daily. 90 tablet 3   metoCLOPramide (REGLAN) 10 MG tablet Take 1 tablet (10 mg total) by mouth every 6 (six) hours as needed for nausea. 15 tablet 0   Multiple Vitamin (MULTIVITAMIN WITH MINERALS) TABS tablet Take 1 tablet by mouth daily.     omeprazole (PRILOSEC) 20 MG capsule Take 1 capsule (20 mg total) by mouth daily. 90 capsule 2   ondansetron (ZOFRAN) 4 MG tablet TAKE 1 TABLET BY MOUTH EVERY 6 HOURS AS NEEDED FOR NAUSEA AND VOMITING 90 tablet 10   phentermine (ADIPEX-P) 37.5 MG tablet Take 1 tablet (37.5 mg total) by mouth every morning. 30 tablet 2   promethazine (PHENERGAN) 25 MG tablet Take 1 tablet (25 mg total) by mouth every 6 (six) hours as needed for nausea or vomiting. 30 tablet 0   ramipril (ALTACE) 2.5 MG capsule Take 1 capsule (2.5 mg total) by mouth daily. 90 capsule 1    rOPINIRole (REQUIP) 3 MG tablet Take 1 tablet (3 mg total) by mouth daily 1-3 hours before bedtime. 90 tablet 1   rosuvastatin (CRESTOR) 40 MG tablet Take 1 tablet (40 mg total) by mouth daily. 90 tablet 1   Semaglutide (RYBELSUS) 14 MG TABS Take 1 tablet (14 mg total) by mouth daily. 90 tablet 1   solifenacin (VESICARE) 5 MG tablet TAKE 1 TABLET (5 MG TOTAL) BY MOUTH DAILY.  Spacer/Aero-Holding Chambers DEVI USE WITH ALBUTEROL INHALER. 1 each 0   topiramate (TOPAMAX) 50 MG tablet Take 1 tablet (50 mg total) by mouth daily. 90 tablet 1   triamcinolone cream (KENALOG) 0.1 % APPLY 1 APPLICATION TOPICALLY TWICE DAILY 80 g 10   vortioxetine HBr (TRINTELLIX) 20 MG TABS tablet Take 1 tablet (20 mg total) by mouth daily. 90 tablet 1   No current facility-administered medications on file prior to visit.   Past Medical History:  Diagnosis Date   2019 novel coronavirus disease (COVID-19) 12/31/2018   Acute hypoxemic respiratory failure due to COVID-19 (HCC)    Chicken pox    Depression    Diabetes (HCC)    GERD (gastroesophageal reflux disease)    Hypertension    Idiopathic progressive neuropathy    Major depressive disorder    Mixed hyperlipidemia    Neuropathy    Osteoarthritis    Pneumonia    Primary insomnia    Primary osteoarthritis of left knee 05/31/2019   RLS (restless legs syndrome)    S/P total knee replacement 07/15/2019   Status post total knee replacement 07/25/2019   Tachycardia    Urge incontinence    UTI (lower urinary tract infection)    Weakness    Past Surgical History:  Procedure Laterality Date   ABDOMINAL HYSTERECTOMY     APPENDECTOMY     CHOLECYSTECTOMY     HERNIA REPAIR     REPLACEMENT TOTAL KNEE Right    TONSILLECTOMY     TOTAL KNEE ARTHROPLASTY Left 07/15/2019   Procedure: TOTAL KNEE ARTHROPLASTY;  Surgeon: Dannielle Huh, MD;  Location: [facility];  Service: Orthopedics;  Laterality: Left;    Family History  Problem Relation Age of Onset   Thyroid disease  Mother    Cancer Mother    Migraines Mother    Brain cancer Father    Heart disease Maternal Grandmother    Diabetes Daughter    Social History   Socioeconomic History   Marital status: Married    Spouse name: Not on file   Number of children: 3   Years of education: 12   Highest education level: Not on file  Occupational History   Occupation: Housewife  Tobacco Use   Smoking status: Former    Current packs/day: 0.00    Average packs/day: 2.0 packs/day for 12.0 years (24.0 ttl pk-yrs)    Types: Cigarettes    Start date: 05/25/1998    Quit date: 05/25/2010    Years since quitting: 12.8   Smokeless tobacco: Never  Vaping Use   Vaping status: Never Used  Substance and Sexual Activity   Alcohol use: Yes    Comment: occasional   Drug use: No   Sexual activity: Not on file  Other Topics Concern   Not on file  Social History Narrative   Born and raised in [place], [place]. Currently lives in a house with her husband. 1 dog. Fun: Garden, feed birds, swimming   Denies any religious beliefs effecting health care.    Social Drivers of Corporate investment banker Strain: Low Risk  (03/08/2023)   Overall Financial Resource Strain (CARDIA)    Difficulty of Paying Living Expenses: Not hard at all  Food Insecurity: No Food Insecurity (03/08/2023)   Hunger Vital Sign    Worried About Running Out of Food in the Last Year: Never true    Ran Out of Food in the Last Year: Never true  Transportation Needs: No Transportation Needs (03/08/2023)  PRAPARE - Administrator, Civil Service (Medical): No    Lack of Transportation (Non-Medical): No  Physical Activity: Insufficiently Active (03/08/2023)   Exercise Vital Sign    Days of Exercise per Week: 7 days    Minutes of Exercise per Session: 20 min  Stress: No Stress Concern Present (03/08/2023)   [name] of Occupational Health - Occupational Stress Questionnaire    Feeling of Stress : Not at all  Social Connections:  Moderately Integrated (03/08/2023)   Social Connection and Isolation Panel [NHANES]    Frequency of Communication with Friends and Family: More than three times a week    Frequency of Social Gatherings with Friends and Family: More than three times a week    Attends Religious Services: More than 4 times per year    Active Member of [employer] or Organizations: No    Attends Engineer, structural: Never    Marital Status: Married    Objective:  BP 112/62 (BP Location: Right Arm, Patient Position: Sitting, Cuff Size: Normal)   Pulse 76   Temp 98.1 F (36.7 C) (Temporal)   Resp 16   Ht 5\' 3"  (1.6 m)   Wt 186 lb (84.4 kg)   SpO2 100%   BMI 32.95 kg/m      03/24/2023    8:58 AM 03/09/2023    9:44 AM 03/08/2023    8:42 AM  BP/Weight  Systolic BP 112 124 132  Diastolic BP 62 74 78  Wt. (Lbs) 186 197 183  BMI 32.95 kg/m2 34.9 kg/m2 32.42 kg/m2    Physical Exam Vitals and nursing note reviewed.  Constitutional:      Appearance: She is obese.  HENT:     Head: Normocephalic and atraumatic.     Nose: Nose normal.  Eyes:     Pupils: Pupils are equal, round, and reactive to light.  Cardiovascular:     Rate and Rhythm: Normal rate and regular rhythm.     Heart sounds: Murmur heard.  Pulmonary:     Effort: Pulmonary effort is normal.     Breath sounds: Normal breath sounds.  Musculoskeletal:     Cervical back: Normal range of motion.  Skin:    General: Skin is warm.  Neurological:     General: No focal deficit present.     Mental Status: She is alert.  Psychiatric:        Mood and Affect: Mood normal.     Diabetic Foot Exam - Simple   No data filed      Lab Results  Component Value Date   WBC 15.4 (H) 03/08/2023   HGB 15.2 03/08/2023   HCT 46.1 03/08/2023   PLT 375 03/08/2023   GLUCOSE 106 (H) 03/08/2023   CHOL 158 03/08/2023   TRIG 234 (H) 03/08/2023   HDL 62 03/08/2023   LDLCALC 59 03/08/2023   ALT 24 03/08/2023   AST 14 03/08/2023   NA 135  03/08/2023   K 4.8 03/08/2023   CL 95 (L) 03/08/2023   CREATININE 0.85 03/08/2023   BUN 16 03/08/2023   CO2 21 03/08/2023   TSH 2.690 11/16/2022   HGBA1C 5.9 (H) 03/08/2023   MICROALBUR 30 09/25/2020      Assessment & Plan:    Primary insomnia Assessment & Plan: Anxiety and difficulty sleeping despite amitriptyline 25 mg at night. Additional medication needed for acute episodes. Explained hydroxyzine should be used only when necessary to avoid overmedication. - Prescribe hydroxyzine 30 tablets as  needed for anxiety and sleep - Instruct to use hydroxyzine only when necessary  General Health Maintenance Requires handicap placard due to neuropathy and respiratory issues. Encouraged to use placard only when necessary to promote physical activity. - Complete and sign handicap placard form - Advise to walk and exercise to improve lung capacity and muscle strength  Follow-up - Schedule follow-up appointment with Dr. Sedalia Muta in two months.   Diabetic polyneuropathy associated with type 2 diabetes mellitus (HCC) Assessment & Plan: Severe neuropathy in legs causing pain and difficulty walking. Lyrica effective, gabapentin was not. Discussed using handicap placard only when necessary to promote physical activity and muscle strength. - Refill Lyrica 300 mg with a month's supply and additional refills - Instruct pharmacy to dispense Lyrica instead of gabapentin  Orders: -     Pregabalin; Take 1 capsule (300 mg total) by mouth 2 (two) times daily.  Dispense: 60 capsule; Refill: 2  Mild intermittent asthma without complication Assessment & Plan: Bronchiectasis with recent exacerbation and pneumonia. Oxygen saturation is 100% on room air, but desaturates to 85% during activities. Discussed need for documentation of oxygen desaturation during activities and sleep. Explained that oxygen is not needed if saturations are above 88%. - Order overnight pulse oximetry to document oxygen desaturation  during activities and sleep - Send letter to [employer] for nighttime oxygen therapy at 2-3 liters to maintain oxygen saturation above 88%      Meds ordered this encounter  Medications   DISCONTD: hydrOXYzine (VISTARIL) 25 MG capsule    Sig: Take 1 capsule (25 mg total) by mouth at bedtime as needed.    Dispense:  30 capsule    Refill:  0   pregabalin (LYRICA) 300 MG capsule    Sig: Take 1 capsule (300 mg total) by mouth 2 (two) times daily.    Dispense:  60 capsule    Refill:  2    Dispense lyrica in place of gabapentin    No orders of the defined types were placed in this encounter.    Follow-up: No follow-ups on file.  An After Visit Summary was printed and given to the patient.  Windell Moment, MD [facility] [phone]

## 2023-03-24 NOTE — Patient Instructions (Signed)
VISIT SUMMARY:  During your visit, we discussed your recent exacerbation of bronchiectasis and pneumonia, your struggles with sleep and anxiety, and your neuropathy. We reviewed your current medications and made some adjustments to better manage your symptoms. We also discussed the need for documentation of oxygen desaturation during activities and sleep, and the use of a handicap placard.  YOUR PLAN:  -BRONCHIECTASIS: Bronchiectasis is a condition where the airways in the lungs become damaged, leading to mucus build-up and infections. Your oxygen levels are good at rest but drop during activities. We will order an overnight pulse oximetry test to document this and send a letter to [employer] for nighttime oxygen therapy at 2-3 liters to keep your oxygen levels above 88%.  -PNEUMONIA: Pneumonia is an infection that inflames the air sacs in one or both lungs. You are recovering well from your recent pneumonia. Please continue your breathing treatments twice daily to aid in your recovery.  -NEUROPATHY: Neuropathy is a condition that results in pain, numbness, or weakness, usually in the hands and feet. Your Lyrica prescription will be refilled as it has been effective for you. We will ensure the pharmacy dispenses Lyrica instead of gabapentin. Use the handicap placard only when necessary to encourage physical activity.  -ANXIETY AND INSOMNIA: Anxiety and insomnia are conditions that affect your mental health and ability to sleep. We will prescribe hydroxyzine for you to use as needed for anxiety and sleep. Please use it only when necessary to avoid overmedication.  -GENERAL HEALTH MAINTENANCE: We discussed the importance of using the handicap placard only when necessary to promote physical activity. Regular walking and exercise can help improve your lung capacity and muscle strength.  INSTRUCTIONS:  Please schedule a follow-up appointment with Dr. Sedalia Muta in two months.

## 2023-03-24 NOTE — Assessment & Plan Note (Signed)
Bronchiectasis with recent exacerbation and pneumonia. Oxygen saturation is 100% on room air, but desaturates to 85% during activities. Discussed need for documentation of oxygen desaturation during activities and sleep. Explained that oxygen is not needed if saturations are above 88%. - Order overnight pulse oximetry to document oxygen desaturation during activities and sleep - Send letter to [employer] for nighttime oxygen therapy at 2-3 liters to maintain oxygen saturation above 88%

## 2023-03-27 ENCOUNTER — Other Ambulatory Visit: Payer: Medicare HMO

## 2023-04-05 ENCOUNTER — Other Ambulatory Visit: Payer: Self-pay | Admitting: Family Medicine

## 2023-04-05 ENCOUNTER — Other Ambulatory Visit: Payer: Medicare HMO

## 2023-04-12 ENCOUNTER — Telehealth: Payer: Self-pay

## 2023-04-12 NOTE — Telephone Encounter (Signed)
 Patient was given Cipro 500mg  2x a day for 10 days.

## 2023-04-12 NOTE — Telephone Encounter (Signed)
 Called patient and made her aware, I will call pharmacy and call her back to see what medication was called in, from records from [facility] looks like patient urine was positive for UTI and cultures just came back and antibiotics was called in from [facility].

## 2023-04-12 NOTE — Telephone Encounter (Signed)
 Copied from CRM [id number]. Topic: Clinical - Medical Advice >> Apr 12, 2023  1:25 PM Roseanne Cones wrote: Reason for CRM: Patient was seen at [facility] ER last Wednesday - they ran some labs and found E. Coli - Patient would like to speak with Dr. Reinhold Carbine or her nurse as the hospital is trying to put her on some medications for this. She also reports being very tired - patient denies any other symptoms. Patient did not know the name of the medication.

## 2023-04-13 ENCOUNTER — Telehealth: Payer: Self-pay

## 2023-04-13 NOTE — Telephone Encounter (Signed)
Called patient made her aware to take antibiotics for UTI and call office to schedule hospital f/u and recheck urine

## 2023-04-13 NOTE — Telephone Encounter (Signed)
Spoke with Christ[facility] Allegra who stated they were picking it up due to not having active insurance on file. Patient was to call back in December to update information. Representative stated she will contact patient to update her information. Letter that Dr. Faylene Kurtz wrote also faxed over to [facility].  Copied from CRM [id number]. Topic: Clinical - Medical Advice >> Apr 13, 2023  2:39 PM Prudencio Pair wrote: Reason for CRM: Patient called stating that she has oxygen at home. She stated that the company came by to pick it up but she missed them because she did not hear them at the door. They left a note. She wants to know if Dr. Sedalia Muta wants her to keep it or let them take it back? If she wants her to keep it, then patient states Dr. Sedalia Muta will need to write her another prescription for it. Please give patient a call back to advise. CB #: [phone].

## 2023-04-15 ENCOUNTER — Other Ambulatory Visit: Payer: Self-pay

## 2023-04-17 ENCOUNTER — Telehealth: Payer: Self-pay | Admitting: Family Medicine

## 2023-04-17 NOTE — Telephone Encounter (Signed)
Copied from CRM [id number]. Topic: Clinical - Prescription Issue >> Apr 12, 2023  4:53 PM Shelah Lewandowsky wrote: Reason for CRM: Emergency room put patient on Ciprofloxacin 500 mg 2x per day, 10 day supply

## 2023-04-17 NOTE — Telephone Encounter (Signed)
 Reviewed. Dr Sedalia Muta

## 2023-04-18 ENCOUNTER — Inpatient Hospital Stay: Payer: 59

## 2023-04-19 ENCOUNTER — Other Ambulatory Visit: Payer: Medicare HMO

## 2023-04-25 ENCOUNTER — Telehealth: Payer: Self-pay

## 2023-04-25 ENCOUNTER — Inpatient Hospital Stay: Payer: 59

## 2023-04-25 NOTE — Telephone Encounter (Signed)
Called patient reschedule her to Thursday Morning.

## 2023-04-25 NOTE — Telephone Encounter (Signed)
Copied from CRM [id number]. Topic: Clinical - Medical Advice >> Apr 25, 2023  7:47 AM Hailey Greene wrote: Reason for CRM: Patient had a hospital follow up today to recheck urine and is unable to make the appointment - she would like to know if she could drop off some urine later on this week on Thursday (she wouldn't be able tomorrow as she has a biopsy).

## 2023-04-26 ENCOUNTER — Ambulatory Visit
Admission: RE | Admit: 2023-04-26 | Discharge: 2023-04-26 | Disposition: A | Discharge status: Home / Self Care | Payer: 59 | Source: Ambulatory Visit | Attending: Family Medicine | Admitting: Family Medicine

## 2023-04-26 DIAGNOSIS — R928 Other abnormal and inconclusive findings on diagnostic imaging of breast: Secondary | ICD-10-CM

## 2023-04-26 DIAGNOSIS — N6310 Unspecified lump in the right breast, unspecified quadrant: Secondary | ICD-10-CM

## 2023-04-26 HISTORY — PX: BREAST BIOPSY: SHX20

## 2023-04-27 ENCOUNTER — Ambulatory Visit (INDEPENDENT_AMBULATORY_CARE_PROVIDER_SITE_OTHER): Payer: 59

## 2023-04-27 VITALS — BP 128/74 | HR 88 | Temp 97.2°F | Ht 63.0 in | Wt 183.0 lb

## 2023-04-27 DIAGNOSIS — F330 Major depressive disorder, recurrent, mild: Secondary | ICD-10-CM | POA: Diagnosis not present

## 2023-04-27 DIAGNOSIS — F418 Other specified anxiety disorders: Secondary | ICD-10-CM | POA: Diagnosis not present

## 2023-04-27 DIAGNOSIS — J479 Bronchiectasis, uncomplicated: Secondary | ICD-10-CM | POA: Diagnosis not present

## 2023-04-27 DIAGNOSIS — N6313 Unspecified lump in the right breast, lower outer quadrant: Secondary | ICD-10-CM | POA: Insufficient documentation

## 2023-04-27 LAB — SURGICAL PATHOLOGY

## 2023-04-27 MED ORDER — HYDROXYZINE PAMOATE 25 MG PO CAPS: 25.0000 mg | ORAL_CAPSULE | Freq: Three times a day (TID) | ORAL | 2 refills | Status: AC | PRN

## 2023-04-27 NOTE — Assessment & Plan Note (Signed)
Continue Trintellix

## 2023-04-27 NOTE — Patient Instructions (Signed)
VISIT SUMMARY:  During today's visit, we discussed your recent upset stomach and nausea following your breast biopsy. We also addressed your increased anxiety, your history of gastrointestinal issues, and your ongoing management of bronchiectasis. We reviewed your general health maintenance and made some adjustments to your care plan.  YOUR PLAN:  -RIGHT BREAST MASS: You recently had a biopsy on your right breast. There are no signs of infection at the biopsy site. We will monitor the site for any signs of infection and follow up with you promptly once the biopsy results are available.  -ANXIETY: Your anxiety has increased due to the recent breast biopsy and waiting for the results. Anxiety is a feeling of worry or fear that can affect your daily life. We will refill your hydroxyzine prescription, which has helped you in the past, and encourage you to practice deep breathing exercises and stay hydrated. Please schedule a follow-up with your primary care physician.  -UPSET STOMACH AND NAUSEA: Your upset stomach and nausea are likely related to anxiety and the recent biopsy. These symptoms can be worsened by anxiety and poor sleep. We recommend you stay hydrated and take hydroxyzine at night to help with these symptoms.  -BRONCHIECTASIS: Bronchiectasis is a condition where the airways in your lungs are damaged, leading to mucus build-up and infections. You have been using a breathing machine twice daily, and we advise you to continue this treatment.  -GENERAL HEALTH MAINTENANCE: You quit smoking 14 years ago, which is excellent for your lung health. Given your low risk, there is no need for a lung cancer screening CT scan at this time. Your previous CT scan in March 2021 showed emphysema and bronchiectasis, so we will cancel the lung cancer screening CT scan.  INSTRUCTIONS:  Please monitor the biopsy site for any signs of infection and stay hydrated. Take hydroxyzine at night to help with anxiety and  sleep. Schedule a follow-up appointment with your primary care physician for continuity of care.

## 2023-04-27 NOTE — Assessment & Plan Note (Addendum)
Increased anxiety related to recent breast biopsy and awaiting results. Reports poor sleep and increased nervousness. Hydroxyzine previously effective. - Refill hydroxyzine - Encourage deep breathing exercises and hydration - Schedule follow-up with primary care physician   Intermittent symptoms likely related to anxiety and recent biopsy. No diarrhea or vomiting. Symptoms may be exacerbated by anxiety and poor sleep. Reports inadequate hydration. - Encourage hydration - Refill hydroxyzine - Advise taking hydroxyzine at night due to drowsiness

## 2023-04-27 NOTE — Progress Notes (Signed)
Subjective:  Patient ID: Hailey Greene, female    DOB: July 12, 1950  Age: 73 y.o. MRN: [id number]  Chief Complaint  Patient presents with   Hospitalization Follow-up    HPI  Patient states she is here today for a hospital follow up. Patient states she went to [facility] for a diarrhea. RH reports chief complaint is nausea, vomiting and diarrhea. Patient states it stopped but started back this morning.  The patient presents with an upset stomach and nausea following a recent breast biopsy which was done yesterday ie 04/26/23   She has been experiencing an upset stomach and nausea since the previous night, following a right breast biopsy performed yesterday. She attributes her stomach upset to anxiety related to the pending biopsy results. No vomiting or diarrhea is reported today.  She has a history of gastrointestinal issues, including episodes of diarrhea and abdominal pain. In July 2024, she visited the emergency room for these symptoms and was diagnosed with viral gastroenteritis, treated with electrolytes and fluids. Similar symptoms occurred in November 2024 and January 2025, with the latter also diagnosed as viral gastroenteritis.  She is currently taking Trintellix 20 mg once daily for depression and reports increased anxiety recently. She uses hydroxyzine as needed for anxiety and sleep issues but has run out of this medication. She previously used amitriptyline for similar symptoms.  She has a history of bronchiectasis and pneumonia, for which she uses a breathing machine twice daily. She was hospitalized in December 2024 for pneumonia in both lungs.  In terms of social history, she quit smoking 14 years ago after smoking for only six to seven months. Her husband notes that she does not drink enough water.      04/27/2023    8:17 AM 03/08/2023    8:51 AM 11/16/2022    8:58 AM 07/29/2022    9:16 AM 03/02/2022    3:45 PM  Depression screen PHQ 2/9  Decreased Interest 0 0 0 0 0  Down,  Depressed, Hopeless 0 0 1 1 0  PHQ - 2 Score 0 0 1 1 0  Altered sleeping 3 0 2 0 0  Tired, decreased energy 0 0 2 0 1  Change in appetite 0 0 2 0 0  Feeling bad or failure about yourself  0 0 0 0 0  Trouble concentrating 0 0 0 0 0  Moving slowly or fidgety/restless 0 0 2 0   Suicidal thoughts 0 0 0 0 0  PHQ-9 Score 3 0 9 1 1   Difficult doing work/chores  Not difficult at all Somewhat difficult Not difficult at all Not difficult at all        04/27/2023    8:17 AM  Fall Risk   Falls in the past year? 1  Number falls in past yr: 1  Injury with Fall? 1  Risk for fall due to : History of fall(s)    Patient Care Team: Blane Ohara, MD as PCP - General (Family Medicine) Dannielle Huh, MD as Consulting Physician (Orthopedic Surgery)   Review of Systems  Constitutional:  Negative for chills, fatigue and fever.  HENT:  Negative for congestion, ear pain and sore throat.   Respiratory:  Negative for cough and shortness of breath.   Cardiovascular:  Negative for chest pain.  Gastrointestinal:  Positive for nausea. Negative for abdominal pain, constipation, diarrhea and vomiting.  Genitourinary:  Negative for dysuria and frequency.  Musculoskeletal:  Negative for arthralgias and myalgias.  Neurological:  Negative for  dizziness and headaches.  Psychiatric/Behavioral:  Negative for dysphoric mood. The patient is nervous/anxious.     Current Outpatient Medications on File Prior to Visit  Medication Sig Dispense Refill   albuterol (VENTOLIN HFA) 108 (90 Base) MCG/ACT inhaler Inhale 2 puffs into the lungs every 6 (six) hours as needed for wheezing or shortness of breath. 8 g 2   amitriptyline (ELAVIL) 25 MG tablet Take 1 tablet (25 mg total) by mouth every evening. 90 tablet 1   ASPIRIN 81 PO Take 81 mg by mouth daily.     blood glucose meter kit and supplies Dispense based on patient and insurance preference. Use up to four times daily as directed. (FOR ICD-10 E10.9, E11.9). 1 each 0   Blood  Glucose Monitoring Suppl (ONETOUCH VERIO) w/Device KIT Use daily to check FBS 1 kit 0   budesonide-formoterol (SYMBICORT) 160-4.5 MCG/ACT inhaler Inhale 2 puffs into the lungs 2 (two) times daily. 10.2 g 3   Cyanocobalamin (VITAMIN B-12) 2500 MCG SUBL DISSOLVE 1 TABLET UNDER THE TONGUE DAILY 90 tablet 3   dicyclomine (BENTYL) 20 MG tablet TAKE 1 TABLET (20 MG TOTAL) BY MOUTH 4 (FOUR) TIMES DAILY AS NEEDED (INTESTINAL SPASMS).     Eszopiclone 3 MG TABS Take 1 tablet (3 mg total) by mouth at bedtime. 30 tablet 0   ezetimibe (ZETIA) 10 MG tablet Take 1 tablet (10 mg total) by mouth daily. 90 tablet 2   fluticasone (FLONASE) 50 MCG/ACT nasal spray SPRAY 2 SPRAYS INTO EACH NOSTRIL EVERY DAY 48 g 2   glucose blood (ONETOUCH VERIO) test strip E11.42 Use new test strip each time when checking FBS 100 each 2   glucose blood test strip Use to test blood sugar once daily as directed 100 each 0   hydrOXYzine (VISTARIL) 25 MG capsule Take 1 capsule (25 mg total) by mouth at bedtime as needed. 90 capsule 0   icosapent Ethyl (VASCEPA) 1 g capsule Take 2 capsules (2 g total) by mouth 2 (two) times daily. 360 capsule 1   ipratropium-albuterol (DUONEB) 0.5-2.5 (3) MG/3ML SOLN TAKE 3 MLS BY NEBULIZATION EVERY 6 (SIX) HOURS AS NEEDED (SOB. WHEEZING). 360 mL 1   Lancets (ONETOUCH DELICA PLUS LANCET33G) MISC E 11.42 Use new lancet each time when checking FBS 100 each 2   Lancets 33G MISC Use to check blood sugar once daily as directed 100 each 0   loratadine (CLARITIN) 10 MG tablet Take 1 tablet (10 mg total) by mouth daily. 90 tablet 3   metoCLOPramide (REGLAN) 10 MG tablet Take 1 tablet (10 mg total) by mouth every 6 (six) hours as needed for nausea. 15 tablet 0   Multiple Vitamin (MULTIVITAMIN WITH MINERALS) TABS tablet Take 1 tablet by mouth daily.     omeprazole (PRILOSEC) 20 MG capsule Take 1 capsule (20 mg total) by mouth daily. 90 capsule 2   ondansetron (ZOFRAN) 4 MG tablet TAKE 1 TABLET BY MOUTH EVERY 6 HOURS  AS NEEDED FOR NAUSEA AND VOMITING 90 tablet 10   phentermine (ADIPEX-P) 37.5 MG tablet Take 1 tablet (37.5 mg total) by mouth every morning. 30 tablet 2   pregabalin (LYRICA) 300 MG capsule Take 1 capsule (300 mg total) by mouth 2 (two) times daily. 60 capsule 2   promethazine (PHENERGAN) 25 MG tablet Take 1 tablet (25 mg total) by mouth every 6 (six) hours as needed for nausea or vomiting. 30 tablet 0   ramipril (ALTACE) 2.5 MG capsule Take 1 capsule (2.5 mg total) by  mouth daily. 90 capsule 1   rOPINIRole (REQUIP) 3 MG tablet Take 1 tablet (3 mg total) by mouth daily 1-3 hours before bedtime. 90 tablet 1   rosuvastatin (CRESTOR) 40 MG tablet Take 1 tablet (40 mg total) by mouth daily. 90 tablet 1   Semaglutide (RYBELSUS) 14 MG TABS Take 1 tablet (14 mg total) by mouth daily. 90 tablet 1   solifenacin (VESICARE) 5 MG tablet TAKE 1 TABLET (5 MG TOTAL) BY MOUTH DAILY.     Spacer/Aero-Holding Chambers DEVI USE WITH ALBUTEROL INHALER. 1 each 0   topiramate (TOPAMAX) 50 MG tablet Take 1 tablet (50 mg total) by mouth daily. 90 tablet 1   triamcinolone cream (KENALOG) 0.1 % APPLY 1 APPLICATION TOPICALLY TWICE DAILY 80 g 10   vortioxetine HBr (TRINTELLIX) 20 MG TABS tablet Take 1 tablet (20 mg total) by mouth daily. 90 tablet 1   No current facility-administered medications on file prior to visit.   Past Medical History:  Diagnosis Date   2019 novel coronavirus disease (COVID-19) 12/31/2018   Acute hypoxemic respiratory failure due to COVID-19 [facility])    Chicken pox    Depression    Diabetes (HCC)    GERD (gastroesophageal reflux disease)    Hypertension    Idiopathic progressive neuropathy    Major depressive disorder    Mixed hyperlipidemia    Neuropathy    Osteoarthritis    Pneumonia    Primary insomnia    Primary osteoarthritis of left knee 05/31/2019   RLS (restless legs syndrome)    S/P total knee replacement 07/15/2019   Status post total knee replacement 07/25/2019   Tachycardia     Urge incontinence    UTI (lower urinary tract infection)    Weakness    Past Surgical History:  Procedure Laterality Date   ABDOMINAL HYSTERECTOMY     APPENDECTOMY     BREAST BIOPSY Right 04/26/2023   [place] RT BREAST BX W LOC DEV 1ST LESION IMG BX SPEC US GUIDE 04/26/2023 [facility]   CHOLECYSTECTOMY     HERNIA REPAIR     REPLACEMENT TOTAL KNEE Right    TONSILLECTOMY     TOTAL KNEE ARTHROPLASTY Left 07/15/2019   Procedure: TOTAL KNEE ARTHROPLASTY;  Surgeon: Dannielle Huh, MD;  Location: [facility];  Service: Orthopedics;  Laterality: Left;    Family History  Problem Relation Age of Onset   Thyroid disease Mother    Cancer Mother    Migraines Mother    Brain cancer Father    Heart disease Maternal Grandmother    Diabetes Daughter    Social History   Socioeconomic History   Marital status: Married    Spouse name: Not on file   Number of children: 3   Years of education: 12   Highest education level: Not on file  Occupational History   Occupation: Housewife  Tobacco Use   Smoking status: Former    Current packs/day: 0.00    Average packs/day: 2.0 packs/day for 12.0 years (24.0 ttl pk-yrs)    Types: Cigarettes    Start date: 05/25/1998    Quit date: 05/25/2010    Years since quitting: 12.9   Smokeless tobacco: Never  Vaping Use   Vaping status: Never Used  Substance and Sexual Activity   Alcohol use: Yes    Comment: occasional   Drug use: No   Sexual activity: Not on file  Other Topics Concern   Not on file  Social History Narrative   Born and raised  in [place], [place]. Currently lives in a house with her husband. 1 dog. Fun: Garden, feed birds, swimming   Denies any religious beliefs effecting health care.    Social Drivers of Corporate investment banker Strain: Low Risk  (03/08/2023)   Overall Financial Resource Strain (CARDIA)    Difficulty of Paying Living Expenses: Not hard at all  Food Insecurity: No Food Insecurity (03/08/2023)   Hunger Vital Sign    Worried  About Running Out of Food in the Last Year: Never true    Ran Out of Food in the Last Year: Never true  Transportation Needs: No Transportation Needs (03/08/2023)   PRAPARE - Administrator, Civil Service (Medical): No    Lack of Transportation (Non-Medical): No  Physical Activity: Insufficiently Active (03/08/2023)   Exercise Vital Sign    Days of Exercise per Week: 7 days    Minutes of Exercise per Session: 20 min  Stress: No Stress Concern Present (03/08/2023)   [name] of Occupational Health - Occupational Stress Questionnaire    Feeling of Stress : Not at all  Social Connections: Moderately Integrated (03/08/2023)   Social Connection and Isolation Panel [NHANES]    Frequency of Communication with Friends and Family: More than three times a week    Frequency of Social Gatherings with Friends and Family: More than three times a week    Attends Religious Services: More than 4 times per year    Active Member of [employer] or Organizations: No    Attends Engineer, structural: Never    Marital Status: Married    Objective:  BP 128/74   Pulse 88   Temp (!) 97.2 F (36.2 C)   Ht 5\' 3"  (1.6 m)   Wt 183 lb (83 kg)   SpO2 97%   BMI 32.42 kg/m      04/27/2023    8:14 AM 03/24/2023    8:58 AM 03/09/2023    9:44 AM  BP/Weight  Systolic BP 128 112 124  Diastolic BP 74 62 74  Wt. (Lbs) 183 186 197  BMI 32.42 kg/m2 32.95 kg/m2 34.9 kg/m2    Physical Exam Vitals and nursing note reviewed.  HENT:     Head: Normocephalic and atraumatic.     Mouth/Throat:     Comments: Dry ridged tongue Cardiovascular:     Rate and Rhythm: Normal rate and regular rhythm.  Pulmonary:     Effort: Pulmonary effort is normal.     Breath sounds: Normal breath sounds.  Abdominal:     General: Bowel sounds are normal. There is no distension.     Tenderness: There is no abdominal tenderness.  Skin:    Comments: Steristrips noted on the outer aspect of right breast from the  biopsy yesterday. No hematoma or mass noted  Neurological:     Mental Status: She is alert.  Psychiatric:     Comments: anxious     Diabetic Foot Exam - Simple   No data filed      Lab Results  Component Value Date   WBC 15.4 (H) 03/08/2023   HGB 15.2 03/08/2023   HCT 46.1 03/08/2023   PLT 375 03/08/2023   GLUCOSE 106 (H) 03/08/2023   CHOL 158 03/08/2023   TRIG 234 (H) 03/08/2023   HDL 62 03/08/2023   LDLCALC 59 03/08/2023   ALT 24 03/08/2023   AST 14 03/08/2023   NA 135 03/08/2023   K 4.8 03/08/2023   CL 95 (L)  03/08/2023   CREATININE 0.85 03/08/2023   BUN 16 03/08/2023   CO2 21 03/08/2023   TSH 2.690 11/16/2022   HGBA1C 5.9 (H) 03/08/2023   MICROALBUR 30 09/25/2020      Assessment & Plan:    Mild recurrent major depression (HCC) Assessment & Plan: Continue Trintellix    Situational anxiety Assessment & Plan: Increased anxiety related to recent breast biopsy and awaiting results. Reports poor sleep and increased nervousness. Hydroxyzine previously effective. - Refill hydroxyzine - Encourage deep breathing exercises and hydration - Schedule follow-up with primary care physician   Intermittent symptoms likely related to anxiety and recent biopsy. No diarrhea or vomiting. Symptoms may be exacerbated by anxiety and poor sleep. Reports inadequate hydration. - Encourage hydration - Refill hydroxyzine - Advise taking hydroxyzine at night due to drowsiness   Breast lump on right side at 7 o'clock position Assessment & Plan: Recent biopsy  right breast  on April 26, 2023. No signs of hematoma, inflammation, or redness. Awaiting biopsy results. Patient anxious about results; reassured about effective treatment options if needed. - Monitor biopsy site for infection - Follow up on biopsy results and inform promptly     Bronchiectasis Bronchiectasis with pneumonia in December 2024. Currently using a breathing machine twice daily. - Continue breathing  machine twice daily  General Health Maintenance Quit smoking 14 years ago after smoking for 6-7 months. No need for lung cancer screening CT scan due to low risk. Previous CT scan in March 2021 showed emphysema and bronchiectasis. - Cancel lung cancer screening CT scan  Follow-up - Change follow-up appointment to primary care physician for continuity of care.   Other orders -     hydrOXYzine Pamoate; Take 1 capsule (25 mg total) by mouth every 8 (eight) hours as needed for up to 10 days.  Dispense: 30 capsule; Refill: 2     Meds ordered this encounter  Medications   hydrOXYzine (VISTARIL) 25 MG capsule    Sig: Take 1 capsule (25 mg total) by mouth every 8 (eight) hours as needed for up to 10 days.    Dispense:  30 capsule    Refill:  2    No orders of the defined types were placed in this encounter.    Follow-up: No follow-ups on file.    An After Visit Summary was printed and given to the patient.  Windell Moment, MD [facility] [phone]

## 2023-04-27 NOTE — Assessment & Plan Note (Addendum)
Recent biopsy  right breast  on April 26, 2023. No signs of hematoma, inflammation, or redness. Awaiting biopsy results. Patient anxious about results; reassured about effective treatment options if needed. - Monitor biopsy site for infection - Follow up on biopsy results and inform promptly     Bronchiectasis Bronchiectasis with pneumonia in December 2024. Currently using a breathing machine twice daily. - Continue breathing machine twice daily  General Health Maintenance Quit smoking 14 years ago after smoking for 6-7 months. No need for lung cancer screening CT scan due to low risk. Previous CT scan in March 2021 showed emphysema and bronchiectasis. - Cancel lung cancer screening CT scan  Follow-up - Change follow-up appointment to primary care physician for continuity of care.

## 2023-05-02 ENCOUNTER — Telehealth: Payer: Self-pay

## 2023-05-02 NOTE — Telephone Encounter (Signed)
Ok. Dr Tome Wilson  

## 2023-05-02 NOTE — Transitions of Care (Post Inpatient/ED Visit) (Signed)
 05/02/2023  Name: Hailey Greene MRN: [id number] DOB: 1950-06-21  Today's TOC FU Call Status: Today's TOC FU Call Status:: Successful TOC FU Call Completed TOC FU Call Complete Date: 05/02/23 Patient's Name and Date of Birth confirmed.  Transition Care Management Follow-up Telephone Call Date of Discharge: 04/29/23 Discharge Facility: Other ([facility]) Name of Other ([facility]) [facility]charge Facility: [facility] Type of Discharge: Inpatient Admission Primary Inpatient Discharge Diagnosis:: Acute diverticilitis along with nausea How have you been since you were released from the hospital?: Better Any questions or concerns?: No  Patient presented to ED with N/V/D and abdominal pain. -CT showed Sigmoid diverticulosis with mild bowel wall thickening and surrounding fat stranding as well as a RIGHT Renal Cyst -Patient treated with IV Zosyn, IV Normal Saline, IV Protonix , IV Zofran /Phenergan   -Respiratory viral panel and GI panel negative -Discharged with Levofloxacin  500 mg every day and Metronidazole 500 mg TID x 10 days -Given RX for Protonix , Zofran , and Imodium   Patient offered appointment with Dr Sirivol in one week however patient prefers to see Dr Sherre.  Appt made for 05/15/23.  Patient aware to call if she has any concerns before her appointment.  Items Reviewed: Did you receive and understand the discharge instructions provided?: Yes Medications obtained,verified, and reconciled?: Yes (Medications Reviewed) Any new allergies since your discharge?: No Dietary orders reviewed?: NA Do you have support at home?: Yes People in Home: spouse  Medications Reviewed Today: Medications Reviewed Today     Reviewed by Claudene Suzen HERO, LPN (Licensed Practical Nurse) on 05/02/23 at 1542  Med List Status: <None>   Medication Order Taking? Sig Documenting Provider Last Dose Status Informant  albuterol  (VENTOLIN  HFA) 108 (90 Base) MCG/ACT inhaler [id number] Yes Inhale 2 puffs  into the lungs every 6 (six) hours as needed for wheezing or shortness of breath. Cox, Kirsten, MD Taking Active   amitriptyline  (ELAVIL ) 25 MG tablet [id number] Yes Take 1 tablet (25 mg total) by mouth every evening. Sherre Clapper, MD Taking Active   ASPIRIN  81 PO [id number] Yes Take 81 mg by mouth daily. [provider] Taking Active   blood glucose meter kit and supplies [id number] Yes Dispense based on patient and insurance preference. Use up to four times daily as directed. (FOR ICD-10 E10.9, E11.9). Charlene Clotilda PARAS, NP Taking Active   Blood Glucose Monitoring Suppl [facility] VERIO) w/Device PRESSLEY [id number] Yes Use daily to check FBS Cox, Kirsten, MD Taking Active   budesonide -formoterol  (SYMBICORT ) 160-4.5 MCG/ACT inhaler [id number] Yes Inhale 2 puffs into the lungs 2 (two) times daily. Cox, Kirsten, MD Taking Active   Cyanocobalamin  (VITAMIN B-12) 2500 MCG SUBL [id number] Yes DISSOLVE 1 TABLET UNDER THE TONGUE DAILY Cox, Kirsten, MD Taking Active   dicyclomine  (BENTYL ) 20 MG tablet [id number] Yes TAKE 1 TABLET (20 MG TOTAL) BY MOUTH 4 (FOUR) TIMES DAILY AS NEEDED (INTESTINAL SPASMS). Sherre Clapper, MD Taking Active   Eszopiclone  3 MG TABS [id number] Yes Take 1 tablet (3 mg total) by mouth at bedtime. Cox, Kirsten, MD Taking Active   ezetimibe  (ZETIA ) 10 MG tablet [id number] Yes Take 1 tablet (10 mg total) by mouth daily. Cox, Kirsten, MD Taking Active   fluticasone  (FLONASE ) 50 MCG/ACT nasal spray [id number] Yes SPRAY 2 SPRAYS INTO EACH NOSTRIL EVERY DAY Cox, Kirsten, MD Taking Active   glucose blood (ONETOUCH VERIO) test strip [id number] Yes E11.42 Use new test strip each time when checking FBS Cox, Kirsten, MD Taking Active   glucose blood test strip [id number] Yes Use to test  blood sugar once daily as directed  Taking Active   hydrOXYzine  (VISTARIL ) 25 MG capsule [id number] Yes Take 1 capsule (25 mg total) by mouth at bedtime as needed. Cox, Kirsten, MD Taking Active   hydrOXYzine  (VISTARIL ) 25 MG  capsule [id number] Yes Take 1 capsule (25 mg total) by mouth every 8 (eight) hours as needed for up to 10 days. Sirivol, Mamatha, MD Taking Active   icosapent  Ethyl (VASCEPA ) 1 g capsule [id number] Yes Take 2 capsules (2 g total) by mouth 2 (two) times daily. Cox, Kirsten, MD Taking Active   ipratropium-albuterol  (DUONEB) 0.5-2.5 (3) MG/3ML SOLN [id number] Yes TAKE 3 MLS BY NEBULIZATION EVERY 6 (SIX) HOURS AS NEEDED (SOB. WHEEZING). Cox, Kirsten, MD Taking Active   Lancets [facility] [place]) [place] [id number] Yes E 11.42 Use new lancet each time when checking FBS Cox, Abigail, MD Taking Active   Lancets 33G MISC [id number] Yes Use to check blood sugar once daily as directed  Taking Active   levofloxacin  (LEVAQUIN ) 500 MG tablet [id number] Yes Take 500 mg by mouth daily. [provider] Taking Active   loperamide  (IMODIUM ) 2 MG capsule [id number] Yes Take by mouth. [provider] Taking Active   loratadine  (CLARITIN ) 10 MG tablet [id number] Yes Take 1 tablet (10 mg total) by mouth daily. Cox, Kirsten, MD Taking Active   metoCLOPramide  (REGLAN ) 10 MG tablet [id number] Yes Take 1 tablet (10 mg total) by mouth every 6 (six) hours as needed for nausea. Cox, Kirsten, MD Taking Active   metroNIDAZOLE (FLAGYL) 500 MG tablet [id number] Yes Take 500 mg by mouth 3 (three) times daily. [provider] Taking Active   Multiple Vitamin (MULTIVITAMIN WITH MINERALS) TABS tablet [id number] Yes Take 1 tablet by mouth daily. [provider] Taking Active Spouse/Significant Other  omeprazole  (PRILOSEC) 20 MG capsule [id number] Yes Take 1 capsule (20 mg total) by mouth daily. Cox, Kirsten, MD Taking Active   ondansetron  (ZOFRAN ) 4 MG tablet [id number] Yes TAKE 1 TABLET BY MOUTH EVERY 6 HOURS AS NEEDED FOR NAUSEA AND VOMITING Cox, Kirsten, MD Taking Active   ondansetron  (ZOFRAN -ODT) 4 MG disintegrating tablet [id number] Yes Take 4 mg by mouth every 6 (six) hours as needed. [provider] Taking Active   pantoprazole  (PROTONIX ) 40 MG tablet [id number] Yes Take 40 mg by mouth daily. [provider] Taking Active   phentermine  (ADIPEX-P ) 37.5 MG tablet [id number] Yes Take 1 tablet (37.5 mg total) by mouth every morning. Cox, Kirsten, MD Taking Active   pregabalin  (LYRICA ) 300 MG capsule [id number] Yes Take 1 capsule (300 mg total) by mouth 2 (two) times daily. Sirivol, Mamatha, MD Taking Active   promethazine  (PHENERGAN ) 25 MG tablet [id number] Yes Take 1 tablet (25 mg total) by mouth every 6 (six) hours as needed for nausea or vomiting. Cox, Kirsten, MD Taking Active   ramipril  (ALTACE ) 2.5 MG capsule [id number] Yes Take 1 capsule (2.5 mg total) by mouth daily. Cox, Kirsten, MD Taking Active   rOPINIRole  (REQUIP ) 3 MG tablet [id number] Yes Take 1 tablet (3 mg total) by mouth daily 1-3 hours before bedtime. Cox, Kirsten, MD Taking Active   rosuvastatin  (CRESTOR ) 40 MG tablet [id number] Yes Take 1 tablet (40 mg total) by mouth daily. Cox, Kirsten, MD Taking Active   Semaglutide  (RYBELSUS ) 14 MG TABS [id number] Yes Take 1 tablet (14 mg total) by mouth daily. Cox, Kirsten, MD Taking Active   solifenacin  (VESICARE ) 5 MG tablet [id number] Yes TAKE 1 TABLET (5 MG TOTAL) BY MOUTH DAILY. Cox, Kirsten,  MD Taking Active   Spacer/Aero-Holding Chambers DEVI [id number] Yes USE WITH ALBUTEROL  INHALER. Cox, Kirsten, MD Taking Active   topiramate  (TOPAMAX ) 50 MG tablet [id number] Yes Take 1 tablet (50 mg total) by mouth daily. Cox, Kirsten, MD Taking Active   triamcinolone  cream (KENALOG ) 0.1 % [id number] Yes APPLY 1 APPLICATION TOPICALLY TWICE DAILY Cox, Kirsten, MD Taking Active   vortioxetine  HBr (TRINTELLIX ) 20 MG TABS tablet [id number] Yes Take 1 tablet (20 mg total) by mouth daily. Cox, Kirsten, MD Taking Active             Home Care and Equipment/Supplies: Were Home Health Services Ordered?: No Any new equipment or medical supplies ordered?: NA  Functional Questionnaire: Do  you need assistance with bathing/showering or dressing?: No Do you need assistance with meal preparation?: No Do you need assistance with eating?: No Do you have difficulty maintaining continence: No Do you need assistance with getting out of bed/getting out of a chair/moving?: No Do you have difficulty managing or taking your medications?: No  Follow up appointments reviewed: PCP Follow-up appointment confirmed?: Yes Date of PCP follow-up appointment?: 05/15/23 Follow-up Provider: [facility] Follow-up appointment confirmed?: NA Do you need transportation to your follow-up appointment?: No Do you understand care options if your condition(s) worsen?: Yes-patient verbalized understanding    SIGNATURE: Luke Sharps, LPN  97/95/74 6:48 PM

## 2023-05-05 ENCOUNTER — Ambulatory Visit: Payer: Self-pay | Admitting: Family Medicine

## 2023-05-05 ENCOUNTER — Telehealth: Payer: Self-pay

## 2023-05-05 NOTE — Telephone Encounter (Signed)
  Chief Complaint: fever, N/V/D, sore throat Symptoms: pain, N/V/D, cough Frequency: N/V/D has been ongoing for weeks, sore throat started yesterday.  Pertinent Negatives: Patient denies Patient denies SOB Disposition: [] ED /[] Urgent Care (no appt availability in office) / [x] Appointment(In office/virtual)/ []  [place]/ [] Home Care/ [x] Refused Recommended Disposition /[]  Mobile Bus/ []  Follow-up with PCP  Additional Notes: Pt has been in/out of hospital in the last month. Pt has been suffering from N/V/D. Pt with different dx through the visits including: diverticulitis, bladder infection, norovirus.  Most recent ED visit was 2 days ago. Pt tested negative for flu/covid. Pt states that she is also suffering from sore throat and cough. + chills. Pt states that sometimes tylenol  and ibuprofen helps, sometimes it does not. Pt told at most recent ED visit that she is suffering from the flu. Pt offered appt with other providers within her pcp office, declined. States ill just go to the emergency room. Pt states that she has been using OTC medications to treat her s/s. Pt states that it is not helping. Pt states watery diarrhea, has been on abx recently and has taken imodium  without any relief.

## 2023-05-05 NOTE — Telephone Encounter (Signed)
 Patient is going to ED per triage note.

## 2023-05-05 NOTE — Telephone Encounter (Signed)
 Please advice   Copied from CRM [id number]. Topic: Clinical - Medical Advice >> May 05, 2023  8:20 AM Carlatta H wrote: Reason for CRM: Patient would like a call back from DR Cox//She was discharged from the hospital on 2/5//Patient would like to be admitted into the hospital to see what's going on//Please call [phone]//

## 2023-05-07 ENCOUNTER — Encounter: Payer: Self-pay | Admitting: Family Medicine

## 2023-05-10 ENCOUNTER — Ambulatory Visit: Payer: Self-pay | Admitting: Surgery

## 2023-05-15 ENCOUNTER — Inpatient Hospital Stay: Payer: 59 | Admitting: Family Medicine

## 2023-05-26 ENCOUNTER — Other Ambulatory Visit: Payer: Self-pay | Admitting: Family Medicine

## 2023-05-26 ENCOUNTER — Telehealth: Payer: Self-pay | Admitting: Family Medicine

## 2023-05-26 DIAGNOSIS — E1142 Type 2 diabetes mellitus with diabetic polyneuropathy: Secondary | ICD-10-CM

## 2023-05-26 MED ORDER — PREGABALIN 300 MG PO CAPS: 300.0000 mg | ORAL_CAPSULE | Freq: Two times a day (BID) | ORAL | 2 refills | Status: DC

## 2023-05-26 NOTE — Telephone Encounter (Signed)
 Copied from CRM [id number]. Topic: Clinical - Medical Advice >> May 26, 2023 12:03 PM Marland Kitchen D wrote: Reason for CRM: Patient called wanting to know if she is due for any vaccines. She asked to be called back.

## 2023-05-26 NOTE — Telephone Encounter (Signed)
 Copied from CRM [id number]. Topic: Clinical - Medication Refill >> May 26, 2023 12:09 PM Dennison Nancy wrote: Most Recent Primary Care Visit:  Provider: Windell Moment  Department: [facility]  Visit Type: HOSPITAL FOLLOW UP  Date: 04/27/2023  Medication: pregabalin (LYRICA) 300 MG capsule  Has the patient contacted their pharmacy? No (Agent: If no, request that the patient contact the pharmacy for the refill. If patient does not wish to contact the pharmacy document the reason why and proceed with request.) (Agent: If yes, when and what did the pharmacy advise?)  Is this the correct pharmacy for this prescription? Yes If no, delete pharmacy and type the correct one.  This is the patient's preferred pharmacy:  [id number] - [place], [place] - [address] [place] [address] Phone: [phone] Fax: [phone]  [place] [facility] [address] [place] [address] Phone: [phone] Fax: [phone]  [facility] - [address], [place] - [address] [place] [address] Phone: [phone] Fax: [phone]  [facility] [place], [place] - [address] [place] [address] Phone: [phone] Fax: [phone]   Has the prescription been filled recently? No  Is the patient out of the medication? Yes  Has the patient been seen for an appointment in the last year OR does the patient have an upcoming appointment? Yes  Can we respond through MyChart? No  Agent: Please be advised that Rx refills may take up to 3 business days. We ask that you follow-up with your pharmacy.

## 2023-05-26 NOTE — Telephone Encounter (Signed)
 Last Fill: 03/24/23  Last OV: 04/27/23 Next OV: 06/19/23 AWV  Routing to provider for review/authorization.

## 2023-05-26 NOTE — Telephone Encounter (Signed)
 Copied from CRM [id number]. Topic: Clinical - Medical Advice >> May 26, 2023 12:07 PM Dennison Nancy wrote: Reason for CRM: vaccine, Patient want to know if need to be vaccine for the rsv and pneumonia ,measles

## 2023-05-26 NOTE — Telephone Encounter (Signed)
 Called patient made her aware that she can go Pneumonia, and RSV and it is recommend, Also her insurance will not cover RSV in office it will have to be done at pharmacy. After speaking to provider made patient aware she can get measle tier done, and if she doesn't have antibodies she can get booster but will need to check with insurance to see if they will cover blood-work and vaccine.   Patient stated she will see doctor at the end of the month and will call insurance and talk to provider at her appointment.

## 2023-05-27 ENCOUNTER — Other Ambulatory Visit: Payer: Self-pay | Admitting: Family Medicine

## 2023-06-05 ENCOUNTER — Ambulatory Visit: Payer: Self-pay | Admitting: Surgery

## 2023-06-05 DIAGNOSIS — D241 Benign neoplasm of right breast: Secondary | ICD-10-CM

## 2023-06-05 NOTE — H&P (Signed)
 Subjective   Chief Complaint: Breast Mass     History of Present Illness: Hailey Greene is a 73 y.o. female who is seen today as an office consultation at the request of Dr. Seymour Bars for evaluation of Breast Mass .   This is a 73 year old female with no family history of breast cancer and no previous breast problems whoRecently underwent routine screening mammogram.  This detected a mass in the right breast.  She underwent further diagnostic mammogram and ultrasound that revealed a mass located at 10:00 in the right breast, 7 cm from the nipple measuring 7 mm in diameter.  She underwent biopsy of this area that revealed intraductal papilloma with no sign of atypia or malignancy.  The patient is referred to [place] to discuss excisional biopsy of this area.  She has no breast complaints.  Interestingly, the patient's chart reveals a lengthy list of medical problems and prescribed medications.  However, the patient states that she stopped taking all of her prescribed medicines 6 months ago.  She has discussed this with her primary care physician.  The patient has no signs of any medical issues at this time.  Her vital signs are normal today.  She has no other complaints.  Review of Systems: A complete review of systems was obtained from the patient.  I have reviewed this information and discussed as appropriate with the patient.  See HPI as well for other ROS.  Review of Systems  Constitutional: Negative.   HENT: Negative.    Eyes: Negative.   Respiratory:  Positive for cough.   Cardiovascular: Negative.   Gastrointestinal: Negative.   Genitourinary: Negative.   Musculoskeletal: Negative.   Skin: Negative.   Neurological: Negative.   Endo/Heme/Allergies: Negative.   Psychiatric/Behavioral: Negative.        Medical History: History reviewed. No pertinent past medical history.  Patient Active Problem List  Diagnosis   Asthma, severe persistent, poorly-controlled, with acute  exacerbation (CMS/HHS-HCC)   Bronchiectasis with acute exacerbation (CMS/HHS-HCC)   Carpal tunnel syndrome   Diabetic polyneuropathy associated with type 2 diabetes mellitus (CMS/HHS-HCC)   Essential hypertension, benign   Gastroesophageal reflux disease   Idiopathic progressive neuropathy   Mild intermittent asthma without complication (HHS-HCC)   Mixed hyperlipidemia   Other irritable bowel syndrome   Ventral hernia without obstruction or gangrene    History reviewed. No pertinent surgical history.   No Known Allergies  No current outpatient medications on file prior to visit.   No current facility-administered medications on file prior to visit.    History reviewed. No pertinent family history.   Social History   Tobacco Use  Smoking Status Never  Smokeless Tobacco Never     Social History   Socioeconomic History   Marital status: Married  Tobacco Use   Smoking status: Never   Smokeless tobacco: Never  Vaping Use   Vaping status: Never Used  Substance and Sexual Activity   Alcohol use: Never   Drug use: Never   Social Drivers of Corporate investment banker Strain: Low Risk  (03/08/2023)   Received from [facility]   Overall Financial Resource Strain (CARDIA)    Difficulty of Paying Living Expenses: Not hard at all  Food Insecurity: No Food Insecurity (03/08/2023)   Received from [facility]   Hunger Vital Sign    Worried About Running Out of Food in the Last Year: Never true    Ran Out of Food in the Last Year: Never true  Transportation  Needs: No Transportation Needs (03/08/2023)   Received from [facility] - Transportation    Lack of Transportation (Medical): No    Lack of Transportation (Non-Medical): No  Physical Activity: Insufficiently Active (03/08/2023)   Received from [facility]   Exercise Vital Sign    Days of Exercise per Week: 7 days    Minutes of Exercise per Session: 20 min  Stress: No Stress Concern Present (03/08/2023)    Received from [facility] - Occupational Stress Questionnaire    Feeling of Stress : Not at all  Social Connections: Moderately Integrated (03/08/2023)   Received from [facility]   Social Connection and Isolation Panel [NHANES]    Frequency of Communication with Friends and Family: More than three times a week    Frequency of Social Gatherings with Friends and Family: More than three times a week    Attends Religious Services: More than 4 times per year    Active Member of Clubs or Organizations: No    Attends Banker Meetings: Never    Marital Status: Married    Objective:    Vitals:   06/05/23 0940 06/05/23 0941  BP: 122/68   Pulse: 104   Temp: 36.4 C (97.6 F)   SpO2: 95%   Weight: 80.2 kg (176 lb 12.8 oz)   Height: 160 cm (5\' 3" )   PainSc:  0-No pain    Body mass index is 31.32 kg/m.  Physical Exam   Constitutional:  WDWN in NAD, conversant, no obvious deformities; lying in bed comfortably Eyes:  Pupils equal, round; sclera anicteric; moist conjunctiva; no lid lag HENT:  Oral mucosa moist; good dentition  Neck:  No masses palpated, trachea midline; no thyromegaly Lungs:  CTA bilaterally; normal respiratory effort Breasts:  symmetric, no nipple changes; no palpable masses or lymphadenopathy on either side CV:  Regular rate and rhythm; no murmurs; extremities well-perfused with no edema Abd:  +bowel sounds, soft, non-tender, no palpable organomegaly; no palpable hernias Musc: Normal gait; no apparent clubbing or cyanosis in extremities Lymphatic:  No palpable cervical or axillary lymphadenopathy Skin:  Warm, dry; no sign of jaundice Psychiatric - alert and oriented x 4; calm mood and affect   Labs, Imaging and Diagnostic Testing: FINAL DIAGNOSIS        1. Breast, right, needle core biopsy, mass, 10:00, 7 cmfn (coil clip) :       -  INTRADUCTAL PAPILLOMA WITH COLUMNAR CELL CHANGE AND USUAL DUCTAL HYPERPLASIA.     CLINICAL DATA:  Screening recall for a possible right breast mass.   EXAM: DIGITAL DIAGNOSTIC UNILATERAL RIGHT MAMMOGRAM WITH TOMOSYNTHESIS AND CAD; ULTRASOUND RIGHT BREAST LIMITED   TECHNIQUE: Right digital diagnostic mammography and breast tomosynthesis was performed. The images were evaluated with computer-aided detection. ; Targeted ultrasound examination of the right breast was performed   COMPARISON:  Previous exam(s).   ACR Breast Density Category c: The breasts are heterogeneously dense, which may obscure small masses.   FINDINGS: Spot compression tomosynthesis images of the upper outer right breast confirm a persistent subcentimeter oval mass with indistinct margins.   Ultrasound of the right breast at 10 o'clock, 7 cm from the nipple demonstrates a hypoechoic oval mass with indistinct margins measuring 7 x 4 x 6 mm. Ultrasound of the right axilla demonstrates multiple normal-appearing lymph nodes.   IMPRESSION: 1. There is an indeterminate 7 mm mass in the right breast at 10 o'clock.   2.  No evidence of right axillary lymphadenopathy.   RECOMMENDATION: Ultrasound guided biopsy is recommended for the right breast mass. We will schedule the patient for the procedure at her earliest convenience.   I have discussed the findings and recommendations with the patient. If applicable, a reminder letter will be sent to the patient regarding the next appointment.   BI-RADS CATEGORY  4: Suspicious.     Electronically Signed   By: Frederico Hamman M.D.   On: 02/21/2023 13:31    Assessment and Plan:  Diagnoses and all orders for this visit:  Intraductal papilloma of breast, right    Recommend right breast radioactive seed localized excisional biopsy.The surgical procedure has been discussed with the patient.  Potential risks, benefits, alternative treatments, and expected outcomes have been explained.  All of the patient's questions at this time have been  answered.  The likelihood of reaching the patient's treatment goal is good.  The patient understands the proposed surgical procedure and wishes to proceed.   Eilis Chestnutt Delbert Harness, MD  06/05/2023 10:00 AM

## 2023-06-07 ENCOUNTER — Other Ambulatory Visit: Payer: Self-pay | Admitting: Surgery

## 2023-06-07 DIAGNOSIS — D241 Benign neoplasm of right breast: Secondary | ICD-10-CM

## 2023-06-15 ENCOUNTER — Telehealth: Payer: Self-pay

## 2023-06-15 ENCOUNTER — Encounter: Payer: 59 | Admitting: Family Medicine

## 2023-06-15 NOTE — Transitions of Care (Post Inpatient/ED Visit) (Signed)
   06/15/2023  Name: Hailey Greene MRN: [id number] DOB: 12-21-1950  Today's TOC FU Call Status: Today's TOC FU Call Status:: Unsuccessful Call (1st Attempt) Unsuccessful Call (1st Attempt) Date: 06/15/23  Attempted to reach the patient regarding the most recent Inpatient/ED visit.  Follow Up Plan: Additional outreach attempts will be made to reach the patient to complete the Transitions of Care (Post Inpatient/ED visit) call.   Signature Karena Addison, LPN [facility] Nurse Health Advisor Direct Dial [phone]

## 2023-06-15 NOTE — Telephone Encounter (Signed)
 Her appointment Monday is for 3 month follow up/annual wellness visit.  Copied from CRM [id number]. Topic: General - Other >> Jun 15, 2023 11:55 AM Marland Kitchen D wrote:  Patient has appointment on Monday she wants to know can her hospital f/u be done at this appointment please call her back to let her know?

## 2023-06-16 ENCOUNTER — Emergency Department (HOSPITAL_COMMUNITY)

## 2023-06-16 ENCOUNTER — Other Ambulatory Visit: Payer: Self-pay

## 2023-06-16 ENCOUNTER — Emergency Department (HOSPITAL_COMMUNITY)
Admission: EM | Admit: 2023-06-16 | Discharge: 2023-06-16 | Disposition: A | Discharge status: Home / Self Care | Attending: Emergency Medicine | Admitting: Emergency Medicine

## 2023-06-16 ENCOUNTER — Encounter (HOSPITAL_COMMUNITY): Payer: Self-pay

## 2023-06-16 DIAGNOSIS — R531 Weakness: Secondary | ICD-10-CM | POA: Diagnosis present

## 2023-06-16 DIAGNOSIS — D72829 Elevated white blood cell count, unspecified: Secondary | ICD-10-CM | POA: Insufficient documentation

## 2023-06-16 DIAGNOSIS — J45909 Unspecified asthma, uncomplicated: Secondary | ICD-10-CM | POA: Insufficient documentation

## 2023-06-16 DIAGNOSIS — G9331 Postviral fatigue syndrome: Secondary | ICD-10-CM | POA: Diagnosis not present

## 2023-06-16 DIAGNOSIS — E871 Hypo-osmolality and hyponatremia: Secondary | ICD-10-CM | POA: Insufficient documentation

## 2023-06-16 DIAGNOSIS — E860 Dehydration: Secondary | ICD-10-CM | POA: Insufficient documentation

## 2023-06-16 DIAGNOSIS — Z79899 Other long term (current) drug therapy: Secondary | ICD-10-CM | POA: Diagnosis not present

## 2023-06-16 DIAGNOSIS — Z7951 Long term (current) use of inhaled steroids: Secondary | ICD-10-CM | POA: Insufficient documentation

## 2023-06-16 DIAGNOSIS — E119 Type 2 diabetes mellitus without complications: Secondary | ICD-10-CM | POA: Insufficient documentation

## 2023-06-16 DIAGNOSIS — I10 Essential (primary) hypertension: Secondary | ICD-10-CM | POA: Diagnosis not present

## 2023-06-16 DIAGNOSIS — Z7982 Long term (current) use of aspirin: Secondary | ICD-10-CM | POA: Diagnosis not present

## 2023-06-16 DIAGNOSIS — U071 COVID-19: Secondary | ICD-10-CM | POA: Insufficient documentation

## 2023-06-16 LAB — URINALYSIS, ROUTINE W REFLEX MICROSCOPIC
Bilirubin Urine: NEGATIVE
Glucose, UA: NEGATIVE mg/dL
Hgb urine dipstick: NEGATIVE
Ketones, ur: NEGATIVE mg/dL
Leukocytes,Ua: NEGATIVE
Nitrite: NEGATIVE
Protein, ur: NEGATIVE mg/dL
Specific Gravity, Urine: 1.021 (ref 1.005–1.030)
pH: 5 (ref 5.0–8.0)

## 2023-06-16 LAB — CBC
HCT: 44 % (ref 36.0–46.0)
Hemoglobin: 14.8 g/dL (ref 12.0–15.0)
MCH: 30.2 pg (ref 26.0–34.0)
MCHC: 33.6 g/dL (ref 30.0–36.0)
MCV: 89.8 fL (ref 80.0–100.0)
Platelets: 314 10*3/uL (ref 150–400)
RBC: 4.9 MIL/uL (ref 3.87–5.11)
RDW: 14.5 % (ref 11.5–15.5)
WBC: 15.3 10*3/uL — ABNORMAL HIGH (ref 4.0–10.5)
nRBC: 0 % (ref 0.0–0.2)

## 2023-06-16 LAB — CBG MONITORING, ED: Glucose-Capillary: 112 mg/dL — ABNORMAL HIGH (ref 70–99)

## 2023-06-16 LAB — BASIC METABOLIC PANEL
Anion gap: 11 (ref 5–15)
BUN: 23 mg/dL (ref 8–23)
CO2: 19 mmol/L — ABNORMAL LOW (ref 22–32)
Calcium: 8.2 mg/dL — ABNORMAL LOW (ref 8.9–10.3)
Chloride: 101 mmol/L (ref 98–111)
Creatinine, Ser: 0.84 mg/dL (ref 0.44–1.00)
GFR, Estimated: >60 mL/min (ref >60)
Glucose, Bld: 113 mg/dL — ABNORMAL HIGH (ref 70–99)
Potassium: 4.3 mmol/L (ref 3.5–5.1)
Sodium: 131 mmol/L — ABNORMAL LOW (ref 135–145)

## 2023-06-16 LAB — RESP PANEL BY RT-PCR (RSV, FLU A&B, COVID)  RVPGX2
Influenza A by PCR: NEGATIVE
Influenza B by PCR: NEGATIVE
Resp Syncytial Virus by PCR: NEGATIVE
SARS Coronavirus 2 by RT PCR: POSITIVE — AB

## 2023-06-16 MED ORDER — LACTATED RINGERS IV BOLUS
1000.0000 mL | Freq: Once | INTRAVENOUS | Status: AC
  Administered 2023-06-16: 1000 mL via INTRAVENOUS

## 2023-06-16 NOTE — ED Provider Notes (Signed)
 [place] EMERGENCY DEPARTMENT AT [facility] Provider Note   CSN: [id number] Arrival date & time: 06/16/23  1646     History  Chief Complaint  Patient presents with   Fatigue    Hailey Greene is a 73 y.o. female.  Patient is a 73 year old female with a past medical history asthma, hypertension, diabetes presenting to the emergency department with weakness and fatigue.  The patient states that she was recently admitted to [facility] with COVID-19 and was discharged yesterday.  She states she has not been feeling any better since being home and just feels very weak and fatigued.  She states that she has had a decreased appetite but denies any nausea or vomiting.  She states her last fever was yesterday.  States she has no cough and that her sore throat has resolved but she still having some shortness of breath on exertion.  She denies any chest pain.  She denies any vomiting or diarrhea, dysuria or hematuria.  She states that she did receive steroids in the hospital but was not sent home on any.  The history is provided by the patient and a relative.       Home Medications Prior to Admission medications   Medication Sig Start Date End Date Taking? Authorizing Provider  albuterol (VENTOLIN HFA) 108 (90 Base) MCG/ACT inhaler Inhale 2 puffs into the lungs every 6 (six) hours as needed for wheezing or shortness of breath. 03/08/23   Cox, Fritzi Mandes, MD  amitriptyline (ELAVIL) 25 MG tablet Take 1 tablet (25 mg total) by mouth every evening. 03/08/23   Blane Ohara, MD  ASPIRIN 81 PO Take 81 mg by mouth daily.    [provider]  blood glucose meter kit and supplies Dispense based on patient and insurance preference. Use up to four times daily as directed. (FOR ICD-10 E10.9, E11.9). 08/10/21   Janie Morning, NP  Blood Glucose Monitoring Suppl (ONETOUCH VERIO) w/Device KIT Use daily to check FBS 06/24/20   Cox, Kirsten, MD  budesonide-formoterol [facility])  160-4.5 MCG/ACT inhaler Inhale 2 puffs into the lungs 2 (two) times daily. 03/08/23   Cox, Fritzi Mandes, MD  Cyanocobalamin (VITAMIN B-12) 2500 MCG SUBL DISSOLVE 1 TABLET UNDER THE TONGUE DAILY 12/21/22   Cox, Kirsten, MD  dicyclomine (BENTYL) 20 MG tablet TAKE 1 TABLET (20 MG TOTAL) BY MOUTH 4 (FOUR) TIMES DAILY AS NEEDED (INTESTINAL SPASMS). 03/09/23   Cox, Fritzi Mandes, MD  Eszopiclone 3 MG TABS Take 1 tablet (3 mg total) by mouth at bedtime. 03/08/23   Cox, Fritzi Mandes, MD  ezetimibe (ZETIA) 10 MG tablet Take 1 tablet (10 mg total) by mouth daily. 03/08/23   Cox, Fritzi Mandes, MD  fluticasone [facility]) 50 MCG/ACT nasal spray SPRAY 2 SPRAYS INTO EACH NOSTRIL EVERY DAY 03/08/23   Cox, Fritzi Mandes, MD  glucose blood (ONETOUCH VERIO) test strip E11.42 Use new test strip each time when checking FBS 06/02/22   Cox, Fritzi Mandes, MD  glucose blood test strip Use to test blood sugar once daily as directed 06/02/22     hydrOXYzine (VISTARIL) 25 MG capsule Take 1 capsule (25 mg total) by mouth at bedtime as needed. 04/16/23 07/15/23  CoxFritzi Mandes, MD  icosapent Ethyl (VASCEPA) 1 g capsule Take 2 capsules (2 g total) by mouth 2 (two) times daily. 03/08/23   Cox, Kirsten, MD  ipratropium-albuterol (DUONEB) 0.5-2.5 (3) MG/3ML SOLN TAKE 3 MLS BY NEBULIZATION EVERY 6 (SIX) HOURS AS NEEDED (SOB. WHEEZING). 05/28/23   Blane Ohara, MD  Lancets [facility]  DELICA PLUS LANCET33G) MISC E 11.42 Use new lancet each time when checking FBS 06/02/22   Cox, Fritzi Mandes, MD  Lancets 33G MISC Use to check blood sugar once daily as directed 06/02/22     levofloxacin (LEVAQUIN) 500 MG tablet Take 500 mg by mouth daily. 04/29/23   [provider]  loperamide (IMODIUM) 2 MG capsule Take by mouth. 04/29/23   [provider]  loratadine (CLARITIN) 10 MG tablet Take 1 tablet (10 mg total) by mouth daily. 03/08/23   Cox, Fritzi Mandes, MD  metoCLOPramide (REGLAN) 10 MG tablet Take 1 tablet (10 mg total) by mouth every 6 (six) hours as needed for nausea. 03/08/23   Cox,  Fritzi Mandes, MD  metroNIDAZOLE (FLAGYL) 500 MG tablet Take 500 mg by mouth 3 (three) times daily. 04/29/23   [provider]  Multiple Vitamin (MULTIVITAMIN WITH MINERALS) TABS tablet Take 1 tablet by mouth daily.    [provider]  omeprazole (PRILOSEC) 20 MG capsule Take 1 capsule (20 mg total) by mouth daily. 03/08/23   Cox, Fritzi Mandes, MD  ondansetron (ZOFRAN) 4 MG tablet TAKE 1 TABLET BY MOUTH EVERY 6 HOURS AS NEEDED FOR NAUSEA AND VOMITING 03/08/23   Cox, Kirsten, MD  ondansetron (ZOFRAN-ODT) 4 MG disintegrating tablet Take 4 mg by mouth every 6 (six) hours as needed. 04/30/23   [provider]  pantoprazole (PROTONIX) 40 MG tablet Take 40 mg by mouth daily. 04/29/23   [provider]  phentermine (ADIPEX-P) 37.5 MG tablet Take 1 tablet (37.5 mg total) by mouth every morning. 03/08/23   Cox, Fritzi Mandes, MD  pregabalin (LYRICA) 300 MG capsule Take 1 capsule (300 mg total) by mouth 2 (two) times daily. 05/26/23   Blane Ohara, MD  promethazine (PHENERGAN) 25 MG tablet Take 1 tablet (25 mg total) by mouth every 6 (six) hours as needed for nausea or vomiting. 03/08/23   Cox, Fritzi Mandes, MD  ramipril (ALTACE) 2.5 MG capsule Take 1 capsule (2.5 mg total) by mouth daily. 03/08/23   Cox, Fritzi Mandes, MD  rOPINIRole (REQUIP) 3 MG tablet Take 1 tablet (3 mg total) by mouth daily 1-3 hours before bedtime. 03/08/23   Cox, Fritzi Mandes, MD  rosuvastatin (CRESTOR) 40 MG tablet Take 1 tablet (40 mg total) by mouth daily. 03/08/23   Cox, Kirsten, MD  Semaglutide (RYBELSUS) 14 MG TABS Take 1 tablet (14 mg total) by mouth daily. 03/08/23   Cox, Fritzi Mandes, MD  solifenacin (VESICARE) 5 MG tablet TAKE 1 TABLET (5 MG TOTAL) BY MOUTH DAILY. 03/09/23   Blane Ohara, MD  Spacer/Aero-Holding Chambers DEVI USE WITH ALBUTEROL INHALER. 03/09/23   Cox, Fritzi Mandes, MD  topiramate (TOPAMAX) 50 MG tablet Take 1 tablet (50 mg total) by mouth daily. 03/08/23   Cox, Fritzi Mandes, MD  triamcinolone cream (KENALOG) 0.1 % APPLY 1  APPLICATION TOPICALLY TWICE DAILY 12/19/22   Cox, Kirsten, MD  vortioxetine HBr (TRINTELLIX) 20 MG TABS tablet Take 1 tablet (20 mg total) by mouth daily. 03/08/23   Blane Ohara, MD      Allergies    Patient has no known allergies.    Review of Systems   Review of Systems  Physical Exam Updated Vital Signs BP 112/83   Pulse 81   Temp 98.1 F (36.7 C) (Oral)   Resp (!) 24   Ht 5\' 3"  (1.6 m)   Wt 83.9 kg   SpO2 96%   BMI 32.77 kg/m  Physical Exam Vitals and nursing note reviewed.  Constitutional:      General: She  is not in acute distress.    Appearance: Normal appearance. She is obese.  HENT:     Head: Normocephalic and atraumatic.     Nose: Nose normal.     Mouth/Throat:     Mouth: Mucous membranes are moist.     Pharynx: Oropharynx is clear.  Eyes:     Extraocular Movements: Extraocular movements intact.     Conjunctiva/sclera: Conjunctivae normal.  Cardiovascular:     Rate and Rhythm: Normal rate and regular rhythm.     Heart sounds: Normal heart sounds.  Pulmonary:     Effort: Pulmonary effort is normal.     Breath sounds: Normal breath sounds.  Abdominal:     General: Abdomen is flat.     Palpations: Abdomen is soft.     Tenderness: There is no abdominal tenderness.  Musculoskeletal:        General: Normal range of motion.     Cervical back: Normal range of motion.     Right lower leg: No edema.     Left lower leg: No edema.  Skin:    General: Skin is warm and dry.  Neurological:     General: No focal deficit present.     Mental Status: She is alert and oriented to person, place, and time.     Sensory: No sensory deficit.     Motor: No weakness.  Psychiatric:        Mood and Affect: Mood normal.        Behavior: Behavior normal.     ED Results / Procedures / Treatments   Labs (all labs ordered are listed, but only abnormal results are displayed) Labs Reviewed  RESP PANEL BY RT-PCR (RSV, FLU A&B, COVID)  [id number] - Abnormal; Notable for the  following components:      Result Value   SARS Coronavirus 2 by RT PCR POSITIVE (*)    All other components within normal limits  BASIC METABOLIC PANEL - Abnormal; Notable for the following components:   Sodium 131 (*)    CO2 19 (*)    Glucose, Bld 113 (*)    Calcium 8.2 (*)    All other components within normal limits  CBC - Abnormal; Notable for the following components:   WBC 15.3 (*)    All other components within normal limits  CBG MONITORING, ED - Abnormal; Notable for the following components:   Glucose-Capillary 112 (*)    All other components within normal limits  URINALYSIS, ROUTINE W REFLEX MICROSCOPIC    EKG EKG Interpretation Date/Time:  Friday June 16 2023 19:19:20 EDT Ventricular Rate:  85 PR Interval:  150 QRS Duration:  81 QT Interval:  397 QTC Calculation: 473 R Axis:   8  Text Interpretation: Sinus rhythm Abnormal R-wave progression, early transition No significant change since last tracing Confirmed by Elayne Snare ([id number]) on 06/16/2023 7:50:28 PM  Radiology DG Chest 2 View Result Date: 06/16/2023 CLINICAL DATA:  Shortness of breath EXAM: CHEST - 2 VIEW COMPARISON:  06/12/2023 FINDINGS: Cardiac shadow is within normal limits. Lungs are well aerated bilaterally. No focal infiltrate or sizable effusion is seen. No bony abnormality is noted. IMPRESSION: No active cardiopulmonary disease. Electronically Signed   By: Alcide Clever M.D.   On: 06/16/2023 19:40    Procedures Procedures    Medications Ordered in ED Medications  lactated ringers bolus 1,000 mL (1,000 mLs Intravenous New Bag/Given 06/16/23 1938)    ED Course/ Medical Decision Making/ A&P Clinical Course as of 06/16/23  2027  Fri Jun 16, 2023  1950 EKG without acute ischemic changes, no acute disease on CXR or UA. Suspect ongoing symptoms with COVID infection and recommended continued supportive care. [VK]    Clinical Course User Index [VK] Rexford Maus, DO                                  Medical Decision Making This patient presents to the ED with chief complaint(s) of weakness, fatigue with pertinent past medical history of hypertension, diabetes, asthma, recent COVID infection which further complicates the presenting complaint. The complaint involves an extensive differential diagnosis and also carries with it a high risk of complications and morbidity.    The differential diagnosis includes arrhythmia, anemia, dehydration, electrolyte abnormality, pneumonia, pneumothorax, pulmonary edema, pleural effusion, viral syndrome  Additional history obtained: Additional history obtained from family Records reviewed Primary Care Documents  ED Course and Reassessment: On patient's arrival she is mildly tachycardic but otherwise hemodynamically stable in no acute distress.  Had labs initiated in triage that showed mild leukocytosis consistent with recent steroid use and COVID infection and mild dehydration with mild hyponatremia and mildly low bicarb.  Urine is pending at this time.  With worsening shortness of breath with chest x-ray to evaluate for pneumonia or other complication.  EKG is pending at this time.  Will be given fluids and will be closely reassessed.  Independent labs interpretation:  The following labs were independently interpreted: leukocytosis, still COVID positive otherwise no significant abnormality  Independent visualization of imaging: - I independently visualized the following imaging with scope of interpretation limited to determining acute life threatening conditions related to emergency care: CXR, which revealed no acute disease  Consultation: - Consulted or discussed management/test interpretation w/ external professional: N/A  Consideration for admission or further workup: Patient has no emergent conditions requiring admission or further work-up at this time and is stable for discharge home with primary care follow-up  Social Determinants of  health: n/A    Amount and/or Complexity of Data Reviewed Labs: ordered. Radiology: ordered.          Final Clinical Impression(s) / ED Diagnoses Final diagnoses:  COVID-19  Postviral fatigue syndrome    Rx / DC Orders ED Discharge Orders     None         Rexford Maus, DO 06/16/23 2027

## 2023-06-16 NOTE — ED Triage Notes (Signed)
 Pt recently d/c from [facility] for COPD exacerbation and covid. Pt states she is not feeling better since d/c. Feels fatigued and not back to baseline.

## 2023-06-16 NOTE — Discharge Instructions (Signed)
 You were seen in the emergency department for your generalized weakness and fatigue.  This is likely related to your recent COVID infection.  You only had mild dehydration on your labs and no signs of any pneumonia or other new infections.  You should make sure that you are drinking plenty of fluids and getting plenty of rest and can follow-up with your primary doctor in the next few days to have your symptoms rechecked.  You should return to the emergency department for having significantly worsening shortness of breath, severe chest pain or any other new or concerning symptoms.

## 2023-06-18 NOTE — Progress Notes (Unsigned)
 Subjective:   Hailey Greene is a 73 y.o. female who presents for Medicare Annual (Subsequent) preventive examination.  Visit Complete: {[phone]}  Patient Medicare AWV questionnaire was completed by the patient on ***; I have confirmed that all information answered by patient is correct and no changes since this date.        Objective:    There were no vitals filed for this visit. There is no height or weight on file to calculate BMI.     02/13/2023    4:18 PM 03/10/2020    9:44 AM 08/09/2019    8:23 AM 08/08/2019    1:50 PM 07/19/2019    5:18 PM 07/15/2019    1:00 PM 05/30/2019    3:29 PM  Advanced Directives  Does Patient Have a Medical Advance Directive? No Yes No No No No Yes  Type of Tax inspector;Out of facility DNR (pink MOST or yellow form)  Would patient like information on creating a medical advance directive?   No - Patient declined  No - Patient declined No - Patient declined     Current Medications (verified) Outpatient Encounter Medications as of 06/19/2023  Medication Sig  . albuterol (VENTOLIN HFA) 108 (90 Base) MCG/ACT inhaler Inhale 2 puffs into the lungs every 6 (six) hours as needed for wheezing or shortness of breath.  Marland Kitchen amitriptyline (ELAVIL) 25 MG tablet Take 1 tablet (25 mg total) by mouth every evening.  . ASPIRIN 81 PO Take 81 mg by mouth daily.  . blood glucose meter kit and supplies Dispense based on patient and insurance preference. Use up to four times daily as directed. (FOR ICD-10 E10.9, E11.9).  Marland Kitchen Blood Glucose Monitoring Suppl (ONETOUCH VERIO) w/Device KIT Use daily to check FBS  . budesonide-formoterol (SYMBICORT) 160-4.5 MCG/ACT inhaler Inhale 2 puffs into the lungs 2 (two) times daily.  . Cyanocobalamin (VITAMIN B-12) 2500 MCG SUBL DISSOLVE 1 TABLET UNDER THE TONGUE DAILY  . dicyclomine (BENTYL) 20 MG tablet TAKE 1 TABLET (20 MG TOTAL) BY MOUTH 4 (FOUR) TIMES DAILY AS NEEDED  (INTESTINAL SPASMS).  . Eszopiclone 3 MG TABS Take 1 tablet (3 mg total) by mouth at bedtime.  Marland Kitchen ezetimibe (ZETIA) 10 MG tablet Take 1 tablet (10 mg total) by mouth daily.  . fluticasone (FLONASE) 50 MCG/ACT nasal spray SPRAY 2 SPRAYS INTO EACH NOSTRIL EVERY DAY  . glucose blood (ONETOUCH VERIO) test strip E11.42 Use new test strip each time when checking FBS  . glucose blood test strip Use to test blood sugar once daily as directed  . hydrOXYzine (VISTARIL) 25 MG capsule Take 1 capsule (25 mg total) by mouth at bedtime as needed.  Marland Kitchen icosapent Ethyl (VASCEPA) 1 g capsule Take 2 capsules (2 g total) by mouth 2 (two) times daily.  Marland Kitchen ipratropium-albuterol (DUONEB) 0.5-2.5 (3) MG/3ML SOLN TAKE 3 MLS BY NEBULIZATION EVERY 6 (SIX) HOURS AS NEEDED (SOB. WHEEZING).  . Lancets (ONETOUCH DELICA PLUS LANCET33G) MISC E 11.42 Use new lancet each time when checking FBS  . Lancets 33G MISC Use to check blood sugar once daily as directed  . levofloxacin (LEVAQUIN) 500 MG tablet Take 500 mg by mouth daily.  Marland Kitchen loperamide (IMODIUM) 2 MG capsule Take by mouth.  . loratadine (CLARITIN) 10 MG tablet Take 1 tablet (10 mg total) by mouth daily.  . metoCLOPramide (REGLAN) 10 MG tablet Take 1 tablet (10 mg total) by mouth every 6 (six) hours as needed for  nausea.  . metroNIDAZOLE (FLAGYL) 500 MG tablet Take 500 mg by mouth 3 (three) times daily.  . Multiple Vitamin (MULTIVITAMIN WITH MINERALS) TABS tablet Take 1 tablet by mouth daily.  Marland Kitchen omeprazole (PRILOSEC) 20 MG capsule Take 1 capsule (20 mg total) by mouth daily.  . ondansetron (ZOFRAN) 4 MG tablet TAKE 1 TABLET BY MOUTH EVERY 6 HOURS AS NEEDED FOR NAUSEA AND VOMITING  . ondansetron (ZOFRAN-ODT) 4 MG disintegrating tablet Take 4 mg by mouth every 6 (six) hours as needed.  . pantoprazole (PROTONIX) 40 MG tablet Take 40 mg by mouth daily.  . phentermine (ADIPEX-P) 37.5 MG tablet Take 1 tablet (37.5 mg total) by mouth every morning.  . pregabalin (LYRICA) 300 MG  capsule Take 1 capsule (300 mg total) by mouth 2 (two) times daily.  . promethazine (PHENERGAN) 25 MG tablet Take 1 tablet (25 mg total) by mouth every 6 (six) hours as needed for nausea or vomiting.  . ramipril (ALTACE) 2.5 MG capsule Take 1 capsule (2.5 mg total) by mouth daily.  Marland Kitchen rOPINIRole (REQUIP) 3 MG tablet Take 1 tablet (3 mg total) by mouth daily 1-3 hours before bedtime.  . rosuvastatin (CRESTOR) 40 MG tablet Take 1 tablet (40 mg total) by mouth daily.  . Semaglutide (RYBELSUS) 14 MG TABS Take 1 tablet (14 mg total) by mouth daily.  . solifenacin (VESICARE) 5 MG tablet TAKE 1 TABLET (5 MG TOTAL) BY MOUTH DAILY.  Marland Kitchen Spacer/Aero-Holding Chambers DEVI USE WITH ALBUTEROL INHALER.  Marland Kitchen topiramate (TOPAMAX) 50 MG tablet Take 1 tablet (50 mg total) by mouth daily.  Marland Kitchen triamcinolone cream (KENALOG) 0.1 % APPLY 1 APPLICATION TOPICALLY TWICE DAILY  . vortioxetine HBr (TRINTELLIX) 20 MG TABS tablet Take 1 tablet (20 mg total) by mouth daily.   No facility-administered encounter medications on file as of 06/19/2023.    Allergies (verified) Patient has no known allergies.   History: Past Medical History:  Diagnosis Date  . 2019 novel coronavirus disease (COVID-19) 12/31/2018  . Acute hypoxemic respiratory failure due to COVID-19 (HCC)   . Chicken pox   . Depression   . Diabetes (HCC)   . GERD (gastroesophageal reflux disease)   . Hypertension   . Idiopathic progressive neuropathy   . Major depressive disorder   . Mixed hyperlipidemia   . Neuropathy   . Osteoarthritis   . Pneumonia   . Primary insomnia   . Primary osteoarthritis of left knee 05/31/2019  . RLS (restless legs syndrome)   . S/P total knee replacement 07/15/2019  . Status post total knee replacement 07/25/2019  . Tachycardia   . Urge incontinence   . UTI (lower urinary tract infection)   . Weakness    Past Surgical History:  Procedure Laterality Date  . ABDOMINAL HYSTERECTOMY    . APPENDECTOMY    . BREAST BIOPSY Right  04/26/2023   [place] RT BREAST BX W LOC DEV 1ST LESION IMG BX SPEC US GUIDE 04/26/2023 [facility]  . CHOLECYSTECTOMY    . HERNIA REPAIR    . REPLACEMENT TOTAL KNEE Right   . TONSILLECTOMY    . TOTAL KNEE ARTHROPLASTY Left 07/15/2019   Procedure: TOTAL KNEE ARTHROPLASTY;  Surgeon: Dannielle Huh, MD;  Location: [facility];  Service: Orthopedics;  Laterality: Left;   Family History  Problem Relation Age of Onset  . Thyroid disease Mother   . Cancer Mother   . Migraines Mother   . Brain cancer Father   . Heart disease Maternal Grandmother   .  Diabetes Daughter    Social History   Socioeconomic History  . Marital status: Married    Spouse name: Not on file  . Number of children: 3  . Years of education: 67  . Highest education level: Not on file  Occupational History  . Occupation: Housewife  Tobacco Use  . Smoking status: Former    Current packs/day: 0.00    Average packs/day: 2.0 packs/day for 12.0 years (24.0 ttl pk-yrs)    Types: Cigarettes    Start date: 05/25/1998    Quit date: 05/25/2010    Years since quitting: 13.0  . Smokeless tobacco: Never  Vaping Use  . Vaping status: Never Used  Substance and Sexual Activity  . Alcohol use: Yes    Comment: occasional  . Drug use: No  . Sexual activity: Not on file  Other Topics Concern  . Not on file  Social History Narrative   Born and raised in [place], [place]. Currently lives in a house with her husband. 1 dog. Fun: Garden, feed birds, swimming   Denies any religious beliefs effecting health care.    Social Drivers of Health   Financial Resource Strain: Low Risk  (03/08/2023)   Overall Financial Resource Strain (CARDIA)   . Difficulty of Paying Living Expenses: Not hard at all  Food Insecurity: No Food Insecurity (03/08/2023)   Hunger Vital Sign   . Worried About Programme researcher, broadcasting/film/video in the Last Year: Never true   . Ran Out of Food in the Last Year: Never true  Transportation Needs: No Transportation Needs (03/08/2023)    PRAPARE - Transportation   . Lack of Transportation (Medical): No   . Lack of Transportation (Non-Medical): No  Physical Activity: Insufficiently Active (03/08/2023)   Exercise Vital Sign   . Days of Exercise per Week: 7 days   . Minutes of Exercise per Session: 20 min  Stress: No Stress Concern Present (03/08/2023)   [name] of Occupational Health - Occupational Stress Questionnaire   . Feeling of Stress : Not at all  Social Connections: Moderately Integrated (03/08/2023)   Social Connection and Isolation Panel [NHANES]   . Frequency of Communication with Friends and Family: More than three times a week   . Frequency of Social Gatherings with Friends and Family: More than three times a week   . Attends Religious Services: More than 4 times per year   . Active Member of Clubs or Organizations: No   . Attends Banker Meetings: Never   . Marital Status: Married    Tobacco Counseling Counseling given: Not Answered   Clinical Intake:                        Activities of Daily Living     No data to display           Patient Care Team: Blane Ohara, MD as PCP - General (Family Medicine) Dannielle Huh, MD as Consulting Physician (Orthopedic Surgery)  Indicate any recent Medical Services you may have received from other than [facility] providers in the past year (date may be approximate).     Assessment:   This is a routine wellness examination for Hailey Greene.  Hearing/Vision screen No results found.   Goals Addressed   None   Depression Screen    04/27/2023    8:17 AM 03/08/2023    8:51 AM 11/16/2022    8:58 AM 07/29/2022    9:16 AM 03/02/2022    3:45  PM 12/29/2021    1:16 PM 11/23/2021    9:30 AM  PHQ 2/9 Scores  PHQ - 2 Score 0 0 1 1 0 0 0  PHQ- 9 Score 3 0 9 1 1   0    Fall Risk    04/27/2023    8:17 AM 11/16/2022    8:57 AM 07/29/2022    9:15 AM 02/28/2022    9:00 AM 12/29/2021    1:20 PM  Fall Risk   Falls in the past year? 1 1 1   0 1  Number falls in past yr: 1 1 0 0 0  Injury with Fall? 1 1 0 0 0  Risk for fall due to : History of fall(s) Impaired balance/gait Impaired mobility No Fall Risks   Follow up  Falls evaluation completed;Falls prevention discussed Falls evaluation completed;Falls prevention discussed Falls evaluation completed     MEDICARE RISK AT HOME:    TIMED UP AND GO:  Was the test performed?  {[phone]}    Cognitive Function:        03/22/2021    2:57 PM 03/10/2020    9:49 AM  6CIT Screen  What Year? 0 points 0 points  What month? 0 points 0 points  What time? 0 points 0 points  Count back from 20 0 points 0 points  Months in reverse 0 points 0 points  Repeat phrase 0 points 0 points  Total Score 0 points 0 points    Immunizations Immunization History  Administered Date(s) Administered  . Fluad Quad(high Dose 65+) 12/19/2019, 12/17/2020, 12/20/2021  . Fluad Trivalent(High Dose 65+) 01/02/2023  . Influenza-Unspecified 01/17/2018, 12/14/2018  . Moderna Covid-19 Fall Seasonal Vaccine 3yrs & older 01/28/2022  . PFIZER(Purple Top)SARS-COV-2 Vaccination 05/27/2019, 06/17/2019, 12/19/2019  . Research officer, trade union 69yrs & up 12/17/2020  . Pfizer(Comirnaty)Fall Seasonal Vaccine 12 years and older 01/02/2023  . Pneumococcal Conjugate-13 04/06/2017  . Pneumococcal Polysaccharide-23 03/11/2021  . Tdap 03/04/2019  . Zoster Recombinant(Shingrix) 08/02/2017, 04/04/2018    {TDAP status:2101805}  {Flu Vaccine status:2101806}  {Pneumococcal vaccine status:2101807}  {Covid-19 vaccine status:2101808}  Qualifies for Shingles Vaccine? {YES/NO:21197}  Zostavax completed {YES/NO:21197}  {Shingrix Completed?:2101804}  Screening Tests Health Maintenance  Topic Date Due  . Lung Cancer Screening  06/30/2020  . OPHTHALMOLOGY EXAM  08/06/2022  . Colonoscopy  04/26/2024 (Originally 05/11/1996)  . COVID-19 Vaccine (7 - Pfizer risk 2024-25 season) 07/03/2023   . Diabetic kidney evaluation - Urine ACR  07/29/2023  . HEMOGLOBIN A1C  09/06/2023  . FOOT EXAM  03/07/2024  . Diabetic kidney evaluation - eGFR measurement  06/15/2024  . Medicare Annual Wellness (AWV)  06/18/2024  . MAMMOGRAM  01/31/2025  . DTaP/Tdap/Td (2 - Td or Tdap) 03/03/2029  . Pneumonia Vaccine 16+ Years old  Completed  . INFLUENZA VACCINE  Completed  . DEXA SCAN  Completed  . Hepatitis C Screening  Completed  . Zoster Vaccines- Shingrix  Completed  . HPV VACCINES  Aged Out    Health Maintenance  Health Maintenance Due  Topic Date Due  . Lung Cancer Screening  06/30/2020  . OPHTHALMOLOGY EXAM  08/06/2022    {Colorectal cancer screening:2101809}  {Mammogram status:21018020}  {Bone Density status:21018021}  Lung Cancer Screening: (Low Dose CT Chest recommended if Age 50-80 years, 20 pack-year currently smoking OR have quit w/in 15years.) {DOES NOT does:27190::"does not"} qualify.   Lung Cancer Screening Referral: ***  Additional Screening:  Hepatitis C Screening: {DOES NOT does:27190::"does not"} qualify; Completed ***  Vision Screening: Recommended annual  ophthalmology exams for early detection of glaucoma and other disorders of the eye. Is the patient up to date with their annual eye exam?  {YES/NO:21197} Who is the provider or what is the name of the office in which the patient attends annual eye exams? *** If pt is not established with a provider, would they like to be referred to a provider to establish care? {YES/NO:21197}.   Dental Screening: Recommended annual dental exams for proper oral hygiene  Diabetic Foot Exam: {Diabetic Foot Exam:2101802}  Community Resource Referral / Chronic Care Management: CRR required this visit?  {YES/NO:21197}  CCM required this visit?  {CCM Required [phone]}     Plan:     I have personally reviewed and noted the following in the patient's chart:   Medical and social history Use of alcohol, tobacco  or illicit drugs  Current medications and supplements including opioid prescriptions. {Opioid Prescriptions:[phone]} Functional ability and status Nutritional status Physical activity Advanced directives List of other physicians Hospitalizations, surgeries, and ER visits in previous 12 months Vitals Screenings to include cognitive, depression, and falls Referrals and appointments  In addition, I have reviewed and discussed with patient certain preventive protocols, quality metrics, and best practice recommendations. A written personalized care plan for preventive services as well as general preventive health recommendations were provided to patient.     Eugenie Norrie, CMA   06/18/2023   After Visit Summary: {CHL AMB AWV After Visit Summary:[phone]}  Nurse Notes: ***

## 2023-06-18 NOTE — Patient Instructions (Signed)

## 2023-06-19 ENCOUNTER — Ambulatory Visit (INDEPENDENT_AMBULATORY_CARE_PROVIDER_SITE_OTHER): Payer: Medicare HMO | Admitting: Family Medicine

## 2023-06-19 ENCOUNTER — Encounter

## 2023-06-19 ENCOUNTER — Encounter: Payer: Self-pay | Admitting: Family Medicine

## 2023-06-19 VITALS — BP 118/64 | HR 78 | Temp 98.0°F | Ht 63.0 in | Wt 172.0 lb

## 2023-06-19 DIAGNOSIS — J301 Allergic rhinitis due to pollen: Secondary | ICD-10-CM

## 2023-06-19 DIAGNOSIS — E1142 Type 2 diabetes mellitus with diabetic polyneuropathy: Secondary | ICD-10-CM

## 2023-06-19 DIAGNOSIS — K219 Gastro-esophageal reflux disease without esophagitis: Secondary | ICD-10-CM

## 2023-06-19 DIAGNOSIS — R110 Nausea: Secondary | ICD-10-CM

## 2023-06-19 DIAGNOSIS — J4551 Severe persistent asthma with (acute) exacerbation: Secondary | ICD-10-CM

## 2023-06-19 DIAGNOSIS — J471 Bronchiectasis with (acute) exacerbation: Secondary | ICD-10-CM | POA: Diagnosis not present

## 2023-06-19 DIAGNOSIS — Z0000 Encounter for general adult medical examination without abnormal findings: Secondary | ICD-10-CM

## 2023-06-19 DIAGNOSIS — U071 COVID-19: Secondary | ICD-10-CM | POA: Diagnosis not present

## 2023-06-19 DIAGNOSIS — E782 Mixed hyperlipidemia: Secondary | ICD-10-CM

## 2023-06-19 DIAGNOSIS — I10 Essential (primary) hypertension: Secondary | ICD-10-CM

## 2023-06-19 MED ORDER — PREGABALIN 300 MG PO CAPS: 300.0000 mg | ORAL_CAPSULE | Freq: Two times a day (BID) | ORAL | 2 refills | Status: DC

## 2023-06-19 MED ORDER — ICOSAPENT ETHYL 1 G PO CAPS: 2.0000 g | ORAL_CAPSULE | Freq: Two times a day (BID) | ORAL | 0 refills | Status: DC

## 2023-06-19 MED ORDER — PHENTERMINE HCL 37.5 MG PO TABS: 37.5000 mg | ORAL_TABLET | Freq: Every morning | ORAL | 0 refills | Status: DC

## 2023-06-19 MED ORDER — EZETIMIBE 10 MG PO TABS
10.0000 mg | ORAL_TABLET | Freq: Every day | ORAL | 0 refills | Status: DC
Start: 2023-06-19 — End: 2023-10-15

## 2023-06-19 MED ORDER — OMEPRAZOLE 20 MG PO CPDR: 20.0000 mg | DELAYED_RELEASE_CAPSULE | Freq: Every day | ORAL | 0 refills | Status: DC

## 2023-06-19 MED ORDER — BREZTRI AEROSPHERE 160-9-4.8 MCG/ACT IN AERO: 2.0000 | INHALATION_SPRAY | Freq: Two times a day (BID) | RESPIRATORY_TRACT | Status: DC

## 2023-06-19 MED ORDER — RYBELSUS 14 MG PO TABS: 1.0000 | ORAL_TABLET | Freq: Every day | ORAL | 0 refills | Status: DC

## 2023-06-19 MED ORDER — AMITRIPTYLINE HCL 25 MG PO TABS: 25.0000 mg | ORAL_TABLET | Freq: Every evening | ORAL | 1 refills | Status: DC

## 2023-06-19 MED ORDER — SOLIFENACIN SUCCINATE 5 MG PO TABS: 5.0000 mg | ORAL_TABLET | Freq: Every day | ORAL | 0 refills | Status: DC

## 2023-06-19 MED ORDER — METOCLOPRAMIDE HCL 10 MG PO TABS: 10.0000 mg | ORAL_TABLET | Freq: Four times a day (QID) | ORAL | 0 refills | Status: DC | PRN

## 2023-06-19 MED ORDER — ROPINIROLE HCL 3 MG PO TABS: 3.0000 mg | ORAL_TABLET | Freq: Every day | ORAL | 0 refills | Status: DC

## 2023-06-19 MED ORDER — TOPIRAMATE 50 MG PO TABS: 50.0000 mg | ORAL_TABLET | Freq: Every day | ORAL | 0 refills | Status: DC

## 2023-06-19 MED ORDER — ALBUTEROL SULFATE HFA 108 (90 BASE) MCG/ACT IN AERS: 2.0000 | INHALATION_SPRAY | Freq: Four times a day (QID) | RESPIRATORY_TRACT | 2 refills | Status: DC | PRN

## 2023-06-19 MED ORDER — ONDANSETRON HCL 4 MG PO TABS: ORAL_TABLET | ORAL | 0 refills | Status: DC

## 2023-06-19 MED ORDER — DICYCLOMINE HCL 20 MG PO TABS: 20.0000 mg | ORAL_TABLET | Freq: Four times a day (QID) | ORAL | 1 refills | Status: DC | PRN

## 2023-06-19 MED ORDER — HYDROXYZINE PAMOATE 25 MG PO CAPS: 25.0000 mg | ORAL_CAPSULE | Freq: Every evening | ORAL | 0 refills | Status: DC | PRN

## 2023-06-19 MED ORDER — VITAMIN B-12 2500 MCG SL SUBL: 1.0000 | SUBLINGUAL_TABLET | Freq: Every day | SUBLINGUAL | 3 refills | Status: DC

## 2023-06-19 MED ORDER — VORTIOXETINE HBR 20 MG PO TABS: 20.0000 mg | ORAL_TABLET | Freq: Every day | ORAL | 0 refills | Status: DC

## 2023-06-19 MED ORDER — IPRATROPIUM-ALBUTEROL 0.5-2.5 (3) MG/3ML IN SOLN: 3.0000 mL | Freq: Four times a day (QID) | RESPIRATORY_TRACT | 1 refills | Status: DC | PRN

## 2023-06-19 MED ORDER — RAMIPRIL 2.5 MG PO CAPS: 2.5000 mg | ORAL_CAPSULE | Freq: Every day | ORAL | 0 refills | Status: DC

## 2023-06-19 MED ORDER — ESZOPICLONE 3 MG PO TABS: 3.0000 mg | ORAL_TABLET | Freq: Every day | ORAL | 2 refills | Status: DC

## 2023-06-19 MED ORDER — FLUTICASONE PROPIONATE 50 MCG/ACT NA SUSP: NASAL | 2 refills | Status: DC

## 2023-06-19 MED ORDER — ROSUVASTATIN CALCIUM 40 MG PO TABS: 40.0000 mg | ORAL_TABLET | Freq: Every day | ORAL | 0 refills | Status: DC

## 2023-06-19 NOTE — Assessment & Plan Note (Signed)
 Change symbicort to breztri 2 puffs twice daily.  Continue duoneb.

## 2023-06-19 NOTE — Assessment & Plan Note (Signed)
 Rxs sent. Return next month for chronic visit.

## 2023-06-19 NOTE — Progress Notes (Signed)
 Subjective:  Patient ID: Hailey Greene, female    DOB: 09-Dec-1950  Age: 73 y.o. MRN: [id number]  Chief Complaint  Patient presents with   Hospitalization Follow-up     Discussed the use of AI scribe software for clinical note transcription with the patient, who gave verbal consent to proceed.  History of Present Illness   The patient, with a history of bronchiectasis and severe persistent asthma, presents with ongoing symptoms following a recent hospitalization for COVID-19. She reports persistent shortness of breath, diarrhea, and coughing. She denies having a fever at present but did have one on the day before the consultation. The patient also reports a lack of appetite and general body aches, which are new symptoms. She has been using Symbicort 2 puffs twice daily, along with breathing treatments three times a day. She also has a nebulizer machine at home, which she purchased recently. The patient reports a sore throat. She has been taking cough medicine, which provides some relief. The patient also reports difficulty sleeping, and requested medicine for sleep.  She was seen at [facility] and admitted 3/17 - 3/19 diagnosed with covid 19. Patient returned on 06/16/2023 to [facility] ED, but was discharged home.   Two days prior to admission at [facility] she was evaluated at the urgent care and given Augmentin and prednisone.  At that time she had a negative COVID test.  She continued to worsen until came into the emergency department and was subsequently admitted as stated above.  She was significant bleeding tachypneic however maintaining saturation of 99% on room air.  ABG, CBC, CMP were stable or normal.  Serum lactic acid 1.5 and procalcitonin 0.06.  proBNP 2080.  Troponin was negative.  PCR was positive for COVID-19.  Chest CT CT showed diffuse bronchiectasis.  She was admitted and treated with IV remdesivir, IV Decadron and nebulizer treatments.  A cough and shortness of  breath improved these medications.  She was on room air throughout her hospitalization.  It was recommended she restart her antibiotic and steroids that she had received from the urgent care as well as the cough medicine.  [facility].  Following her discharge she did not feel significantly better and so returned to [facility] emergency department.  She was evaluated discharged back home.  Patient continues to have symptoms however her oxygen level is stable.  She does have oxygen at home.  With diagnosis of bronchiectasis I asked the patient if she had a flutter device.  She is unaware of what I was talking about.   Hospital Follow Up was seen at [facility] and [facility].  Does the patient have respiratory secretions caused by COPD, Bronchiectasis or Emphysema? Yes due to bronchiectasis. Is anyone available to provide manual percussion therapy? NO Has patient or caregiver been trained to use flutter device? NO       04/27/2023    8:17 AM 03/08/2023    8:51 AM 11/16/2022    8:58 AM 07/29/2022    9:16 AM 03/02/2022    3:45 PM  Depression screen PHQ 2/9  Decreased Interest 0 0 0 0 0  Down, Depressed, Hopeless 0 0 1 1 0  PHQ - 2 Score 0 0 1 1 0  Altered sleeping 3 0 2 0 0  Tired, decreased energy 0 0 2 0 1  Change in appetite 0 0 2 0 0  Feeling bad or failure about yourself  0 0 0 0 0  Trouble  concentrating 0 0 0 0 0  Moving slowly or fidgety/restless 0 0 2 0   Suicidal thoughts 0 0 0 0 0  PHQ-9 Score 3 0 9 1 1   Difficult doing work/chores  Not difficult at all Somewhat difficult Not difficult at all Not difficult at all        04/27/2023    8:17 AM  Fall Risk   Falls in the past year? 1  Number falls in past yr: 1  Injury with Fall? 1  Risk for fall due to : History of fall(s)    Patient Care Team: Blane Ohara, MD as PCP - General (Family Medicine) Dannielle Huh, MD as Consulting Physician (Orthopedic Surgery)   Review of Systems  Constitutional:  Negative for  chills, fatigue and fever.  HENT:  Negative for congestion, ear pain, rhinorrhea and sore throat.   Respiratory:  Positive for cough and shortness of breath (Covid +).   Cardiovascular:  Negative for chest pain.  Gastrointestinal:  Positive for diarrhea. Negative for abdominal pain, constipation, nausea and vomiting.  Genitourinary:  Negative for dysuria and urgency.  Musculoskeletal:  Negative for back pain and myalgias.  Neurological:  Positive for headaches. Negative for dizziness, weakness and light-headedness.  Psychiatric/Behavioral:  Negative for dysphoric mood. The patient is not nervous/anxious.     Current Outpatient Medications on File Prior to Visit  Medication Sig Dispense Refill   ASPIRIN 81 PO Take 81 mg by mouth daily.     blood glucose meter kit and supplies Dispense based on patient and insurance preference. Use up to four times daily as directed. (FOR ICD-10 E10.9, E11.9). 1 each 0   Blood Glucose Monitoring Suppl (ONETOUCH VERIO) w/Device KIT Use daily to check FBS 1 kit 0   budesonide-formoterol (SYMBICORT) 160-4.5 MCG/ACT inhaler Inhale 2 puffs into the lungs 2 (two) times daily. 10.2 g 3   glucose blood (ONETOUCH VERIO) test strip E11.42 Use new test strip each time when checking FBS 100 each 2   glucose blood test strip Use to test blood sugar once daily as directed 100 each 0   Lancets (ONETOUCH DELICA PLUS LANCET33G) MISC E 11.42 Use new lancet each time when checking FBS 100 each 2   Lancets 33G MISC Use to check blood sugar once daily as directed 100 each 0   loperamide (IMODIUM) 2 MG capsule Take by mouth.     loratadine (CLARITIN) 10 MG tablet Take 1 tablet (10 mg total) by mouth daily. 90 tablet 3   Multiple Vitamin (MULTIVITAMIN WITH MINERALS) TABS tablet Take 1 tablet by mouth daily.     pantoprazole (PROTONIX) 40 MG tablet Take 40 mg by mouth daily.     promethazine (PHENERGAN) 25 MG tablet Take 1 tablet (25 mg total) by mouth every 6 (six) hours as needed  for nausea or vomiting. 30 tablet 0   Spacer/Aero-Holding Chambers DEVI USE WITH ALBUTEROL INHALER. 1 each 0   triamcinolone cream (KENALOG) 0.1 % APPLY 1 APPLICATION TOPICALLY TWICE DAILY 80 g 10   No current facility-administered medications on file prior to visit.   Past Medical History:  Diagnosis Date   2019 novel coronavirus disease (COVID-19) 12/31/2018   Acute hypoxemic respiratory failure due to COVID-19 (HCC)    Chicken pox    Depression    Diabetes (HCC)    GERD (gastroesophageal reflux disease)    Hypertension    Idiopathic progressive neuropathy    Major depressive disorder    Mixed hyperlipidemia  Neuropathy    Osteoarthritis    Pneumonia    Primary insomnia    Primary osteoarthritis of left knee 05/31/2019   RLS (restless legs syndrome)    S/P total knee replacement 07/15/2019   Status post total knee replacement 07/25/2019   Tachycardia    Urge incontinence    UTI (lower urinary tract infection)    Weakness    Past Surgical History:  Procedure Laterality Date   ABDOMINAL HYSTERECTOMY     APPENDECTOMY     BREAST BIOPSY Right 04/26/2023   [place] RT BREAST BX W LOC DEV 1ST LESION IMG BX SPEC US GUIDE 04/26/2023 [facility]   CHOLECYSTECTOMY     HERNIA REPAIR     REPLACEMENT TOTAL KNEE Right    TONSILLECTOMY     TOTAL KNEE ARTHROPLASTY Left 07/15/2019   Procedure: TOTAL KNEE ARTHROPLASTY;  Surgeon: Dannielle Huh, MD;  Location: [facility];  Service: Orthopedics;  Laterality: Left;    Family History  Problem Relation Age of Onset   Thyroid disease Mother    Cancer Mother    Migraines Mother    Brain cancer Father    Heart disease Maternal Grandmother    Diabetes Daughter    Social History   Socioeconomic History   Marital status: Married    Spouse name: Not on file   Number of children: 3   Years of education: 12   Highest education level: Not on file  Occupational History   Occupation: Housewife  Tobacco Use   Smoking status: Former    Current  packs/day: 0.00    Average packs/day: 2.0 packs/day for 12.0 years (24.0 ttl pk-yrs)    Types: Cigarettes    Start date: 05/25/1998    Quit date: 05/25/2010    Years since quitting: 13.0   Smokeless tobacco: Never  Vaping Use   Vaping status: Never Used  Substance and Sexual Activity   Alcohol use: Yes    Comment: occasional   Drug use: No   Sexual activity: Not on file  Other Topics Concern   Not on file  Social History Narrative   Born and raised in [place], [place]. Currently lives in a house with her husband. 1 dog. Fun: Garden, feed birds, swimming   Denies any religious beliefs effecting health care.    Social Drivers of Corporate investment banker Strain: Low Risk  (03/08/2023)   Overall Financial Resource Strain (CARDIA)    Difficulty of Paying Living Expenses: Not hard at all  Food Insecurity: No Food Insecurity (06/19/2023)   Hunger Vital Sign    Worried About Running Out of Food in the Last Year: Never true    Ran Out of Food in the Last Year: Never true  Transportation Needs: No Transportation Needs (06/19/2023)   PRAPARE - Administrator, Civil Service (Medical): No    Lack of Transportation (Non-Medical): No  Physical Activity: Insufficiently Active (03/08/2023)   Exercise Vital Sign    Days of Exercise per Week: 7 days    Minutes of Exercise per Session: 20 min  Stress: No Stress Concern Present (03/08/2023)   [name] of Occupational Health - Occupational Stress Questionnaire    Feeling of Stress : Not at all  Social Connections: Moderately Integrated (03/08/2023)   Social Connection and Isolation Panel [NHANES]    Frequency of Communication with Friends and Family: More than three times a week    Frequency of Social Gatherings with Friends and Family: More than three times  a week    Attends Religious Services: More than 4 times per year    Active Member of Clubs or Organizations: No    Attends Banker Meetings: Never    Marital  Status: Married    Objective:  BP 118/64   Pulse 78   Temp 98 F (36.7 C)   Ht 5\' 3"  (1.6 m)   Wt 172 lb (78 kg)   SpO2 95%   BMI 30.47 kg/m      06/19/2023   10:10 AM 06/16/2023    8:30 PM 06/16/2023    7:45 PM  BP/Weight  Systolic BP 118 135 112  Diastolic BP 64 60 83  Wt. (Lbs) 172    BMI 30.47 kg/m2      Physical Exam Vitals reviewed.  Constitutional:      Appearance: She is ill-appearing.  HENT:     Mouth/Throat:     Mouth: Mucous membranes are dry.     Pharynx: Oropharynx is clear.  Cardiovascular:     Rate and Rhythm: Normal rate and regular rhythm.     Heart sounds: Normal heart sounds. No murmur heard. Pulmonary:     Effort: Pulmonary effort is normal. No respiratory distress.     Breath sounds: Normal breath sounds.  Neurological:     Mental Status: She is alert and oriented to person, place, and time.  Psychiatric:        Mood and Affect: Mood normal.        Behavior: Behavior normal.     Diabetic Foot Exam - Simple   No data filed      Lab Results  Component Value Date   WBC 15.3 (H) 06/16/2023   HGB 14.8 06/16/2023   HCT 44.0 06/16/2023   PLT 314 06/16/2023   GLUCOSE 113 (H) 06/16/2023   CHOL 158 03/08/2023   TRIG 234 (H) 03/08/2023   HDL 62 03/08/2023   LDLCALC 59 03/08/2023   ALT 24 03/08/2023   AST 14 03/08/2023   NA 131 (L) 06/16/2023   K 4.3 06/16/2023   CL 101 06/16/2023   CREATININE 0.84 06/16/2023   BUN 23 06/16/2023   CO2 19 (L) 06/16/2023   TSH 2.690 11/16/2022   HGBA1C 5.9 (H) 03/08/2023   MICROALBUR 30 09/25/2020      Assessment & Plan:   Bronchiectasis with acute exacerbation (HCC) Assessment & Plan: Bronchiectasis compromises lung function and may contribute to prolonged recovery from COVID-19. This condition may also exacerbate respiratory symptoms. Discussed potential benefit of Breztri inhaler for additional bronchodilation. - Change Breztri inhaler instead of Symbicort for additional bronchodilation. 2  puffs twice daily.  - Order to [facility] to obtain a flutter device to assist with secretion clearance.    Asthma, severe persistent, poorly-controlled, with acute exacerbation Assessment & Plan: Change symbicort to breztri 2 puffs twice daily.  Continue duoneb.     COVID-19 Assessment & Plan: Admitted to [facility] from March 17 to March 19 for COVID-19. Treated with IV Decadron, IV Remdesivir, DuoNeb treatments, and Pulmicort. Discharged with prednisone and Zithromax. Continues to experience shortness of breath, cough, and diarrhea. Oxygen saturation is 95%, indicating adequate oxygenation. Lungs are compromised, likely due to bronchiectasis, which may prolong recovery. Reports feeling achy and has a sore throat, attributed to dryness and dehydration. Recovery may take up to a week due to compromised lung function. - Change Symbicort inhaler to breztri two puffs in the morning and two puffs at night. - Use breathing treatments three  times a day. - Ensure adequate hydration and nutrition. - Use oxygen if experiencing difficulty breathing. - Order a flutter device to help clear secretions. - Rest and allow time for recovery. - Seek medical attention if condition worsens.   Diabetic polyneuropathy associated with type 2 diabetes mellitus (HCC) -     Amitriptyline HCl; Take 1 tablet (25 mg total) by mouth every evening.  Dispense: 90 tablet; Refill: 1 -     Pregabalin; Take 1 capsule (300 mg total) by mouth 2 (two) times daily.  Dispense: 60 capsule; Refill: 2 -     Rybelsus; Take 1 tablet (14 mg total) by mouth daily.  Dispense: 90 tablet; Refill: 0  Mixed hyperlipidemia Assessment & Plan: Rxs sent. Return next month for chronic visit.   Orders: -     Ezetimibe; Take 1 tablet (10 mg total) by mouth daily.  Dispense: 90 tablet; Refill: 0 -     Icosapent Ethyl; Take 2 capsules (2 g total) by mouth 2 (two) times daily.  Dispense: 120 capsule; Refill: 0 -     Rosuvastatin Calcium; Take  1 tablet (40 mg total) by mouth daily.  Dispense: 90 tablet; Refill: 0  Non-seasonal allergic rhinitis due to pollen Assessment & Plan: Rxs sent. Return next month for chronic visit.   Orders: -     Fluticasone Propionate; SPRAY 2 SPRAYS INTO EACH NOSTRIL EVERY DAY  Dispense: 48 g; Refill: 2  Gastroesophageal reflux disease, unspecified whether esophagitis present Assessment & Plan: Rxs sent. Return next month for chronic visit.   Orders: -     Omeprazole; Take 1 capsule (20 mg total) by mouth daily.  Dispense: 90 capsule; Refill: 0  Nausea Assessment & Plan: Rxs sent. Return next month for chronic visit.   Orders: -     Ondansetron HCl; TAKE 1 TABLET BY MOUTH EVERY 6 HOURS AS NEEDED FOR NAUSEA AND VOMITING  Dispense: 90 tablet; Refill: 0  Essential hypertension, benign -     Ramipril; Take 1 capsule (2.5 mg total) by mouth daily.  Dispense: 90 capsule; Refill: 0  Other orders -     Albuterol Sulfate HFA; Inhale 2 puffs into the lungs every 6 (six) hours as needed for wheezing or shortness of breath.  Dispense: 8 g; Refill: 2 -     Vitamin B-12; Place 1 tablet (2,500 mcg total) under the tongue daily. DISSOLVE 1 TABLET UNDER THE TONGUE DAILY  Dispense: 90 tablet; Refill: 3 -     Dicyclomine HCl; Take 1 tablet (20 mg total) by mouth 4 (four) times daily as needed (intestinal spasms).  Dispense: 120 tablet; Refill: 1 -     Eszopiclone; Take 1 tablet (3 mg total) by mouth at bedtime.  Dispense: 30 tablet; Refill: 2 -     hydrOXYzine Pamoate; Take 1 capsule (25 mg total) by mouth at bedtime as needed.  Dispense: 90 capsule; Refill: 0 -     Ipratropium-Albuterol; Take 3 mLs by nebulization every 6 (six) hours as needed (SOB. WHEEZING).  Dispense: 360 mL; Refill: 1 -     Metoclopramide HCl; Take 1 tablet (10 mg total) by mouth every 6 (six) hours as needed for nausea.  Dispense: 30 tablet; Refill: 0 -     Phentermine HCl; Take 1 tablet (37.5 mg total) by mouth every morning.  Dispense: 30  tablet; Refill: 0 -     rOPINIRole HCl; Take 1 tablet (3 mg total) by mouth daily 1-3 hours before bedtime.  Dispense: 90 tablet;  Refill: 0 -     Solifenacin Succinate; Take 1 tablet (5 mg total) by mouth daily.  Dispense: 90 tablet; Refill: 0 -     Topiramate; Take 1 tablet (50 mg total) by mouth daily.  Dispense: 90 tablet; Refill: 0 -     Vortioxetine HBr; Take 1 tablet (20 mg total) by mouth daily.  Dispense: 90 tablet; Refill: 0     Meds ordered this encounter  Medications   albuterol (VENTOLIN HFA) 108 (90 Base) MCG/ACT inhaler    Sig: Inhale 2 puffs into the lungs every 6 (six) hours as needed for wheezing or shortness of breath.    Dispense:  8 g    Refill:  2   amitriptyline (ELAVIL) 25 MG tablet    Sig: Take 1 tablet (25 mg total) by mouth every evening.    Dispense:  90 tablet    Refill:  1    This prescription was filled on 09/07/2022. Any refills authorized will be placed on file.   Cyanocobalamin (VITAMIN B-12) 2500 MCG SUBL    Sig: Place 1 tablet (2,500 mcg total) under the tongue daily. DISSOLVE 1 TABLET UNDER THE TONGUE DAILY    Dispense:  90 tablet    Refill:  3   dicyclomine (BENTYL) 20 MG tablet    Sig: Take 1 tablet (20 mg total) by mouth 4 (four) times daily as needed (intestinal spasms).    Dispense:  120 tablet    Refill:  1   Eszopiclone 3 MG TABS    Sig: Take 1 tablet (3 mg total) by mouth at bedtime.    Dispense:  30 tablet    Refill:  2   ezetimibe (ZETIA) 10 MG tablet    Sig: Take 1 tablet (10 mg total) by mouth daily.    Dispense:  90 tablet    Refill:  0   fluticasone (FLONASE) 50 MCG/ACT nasal spray    Sig: SPRAY 2 SPRAYS INTO EACH NOSTRIL EVERY DAY    Dispense:  48 g    Refill:  2   hydrOXYzine (VISTARIL) 25 MG capsule    Sig: Take 1 capsule (25 mg total) by mouth at bedtime as needed.    Dispense:  90 capsule    Refill:  0   icosapent Ethyl (VASCEPA) 1 g capsule    Sig: Take 2 capsules (2 g total) by mouth 2 (two) times daily.    Dispense:   120 capsule    Refill:  0   ipratropium-albuterol (DUONEB) 0.5-2.5 (3) MG/3ML SOLN    Sig: Take 3 mLs by nebulization every 6 (six) hours as needed (SOB. WHEEZING).    Dispense:  360 mL    Refill:  1   metoCLOPramide (REGLAN) 10 MG tablet    Sig: Take 1 tablet (10 mg total) by mouth every 6 (six) hours as needed for nausea.    Dispense:  30 tablet    Refill:  0   omeprazole (PRILOSEC) 20 MG capsule    Sig: Take 1 capsule (20 mg total) by mouth daily.    Dispense:  90 capsule    Refill:  0   ondansetron (ZOFRAN) 4 MG tablet    Sig: TAKE 1 TABLET BY MOUTH EVERY 6 HOURS AS NEEDED FOR NAUSEA AND VOMITING    Dispense:  90 tablet    Refill:  0   phentermine (ADIPEX-P) 37.5 MG tablet    Sig: Take 1 tablet (37.5 mg total) by mouth every morning.  Dispense:  30 tablet    Refill:  0   pregabalin (LYRICA) 300 MG capsule    Sig: Take 1 capsule (300 mg total) by mouth 2 (two) times daily.    Dispense:  60 capsule    Refill:  2    Dispense lyrica in place of gabapentin   ramipril (ALTACE) 2.5 MG capsule    Sig: Take 1 capsule (2.5 mg total) by mouth daily.    Dispense:  90 capsule    Refill:  0    Patient changing from [place] to [place]. Patient needs some meds this week and would like them delivered.   rOPINIRole (REQUIP) 3 MG tablet    Sig: Take 1 tablet (3 mg total) by mouth daily 1-3 hours before bedtime.    Dispense:  90 tablet    Refill:  0    This prescription was filled on 10/07/2022. Any refills authorized will be placed on file.   rosuvastatin (CRESTOR) 40 MG tablet    Sig: Take 1 tablet (40 mg total) by mouth daily.    Dispense:  90 tablet    Refill:  0   Semaglutide (RYBELSUS) 14 MG TABS    Sig: Take 1 tablet (14 mg total) by mouth daily.    Dispense:  90 tablet    Refill:  0   solifenacin (VESICARE) 5 MG tablet    Sig: Take 1 tablet (5 mg total) by mouth daily.    Dispense:  90 tablet    Refill:  0   topiramate (TOPAMAX) 50 MG tablet    Sig: Take 1 tablet (50  mg total) by mouth daily.    Dispense:  90 tablet    Refill:  0   vortioxetine HBr (TRINTELLIX) 20 MG TABS tablet    Sig: Take 1 tablet (20 mg total) by mouth daily.    Dispense:  90 tablet    Refill:  0    This prescription was filled on 10/07/2022. Any refills authorized will be placed on file.    No orders of the defined types were placed in this encounter.    Follow-up: Return in 6 weeks (on 07/31/2023) for chronic follow up.   I,Katherina A Bramblett,acting as a scribe for Blane Ohara, MD.,have documented all relevant documentation on the behalf of Blane Ohara, MD,as directed by  Blane Ohara, MD while in the presence of Blane Ohara, MD.   An After Visit Summary was printed and given to the patient.  Blane Ohara, MD [facility] [phone]

## 2023-06-19 NOTE — Telephone Encounter (Signed)
 Patient was never called. I spoke with Dr. Sedalia Muta, the appointment for today has been changed to a hospital f.up.

## 2023-06-19 NOTE — Assessment & Plan Note (Signed)
 Bronchiectasis compromises lung function and may contribute to prolonged recovery from COVID-19. This condition may also exacerbate respiratory symptoms. Discussed potential benefit of Breztri inhaler for additional bronchodilation. - Change Breztri inhaler instead of Symbicort for additional bronchodilation. 2 puffs twice daily.  - Order to [facility] to obtain a flutter device to assist with secretion clearance.

## 2023-06-19 NOTE — Assessment & Plan Note (Signed)
 Admitted to [facility] from March 17 to March 19 for COVID-19. Treated with IV Decadron, IV Remdesivir, DuoNeb treatments, and Pulmicort. Discharged with prednisone and Zithromax. Continues to experience shortness of breath, cough, and diarrhea. Oxygen saturation is 95%, indicating adequate oxygenation. Lungs are compromised, likely due to bronchiectasis, which may prolong recovery. Reports feeling achy and has a sore throat, attributed to dryness and dehydration. Recovery may take up to a week due to compromised lung function. - Change Symbicort inhaler to breztri two puffs in the morning and two puffs at night. - Use breathing treatments three times a day. - Ensure adequate hydration and nutrition. - Use oxygen if experiencing difficulty breathing. - Order a flutter device to help clear secretions. - Rest and allow time for recovery. - Seek medical attention if condition worsens.

## 2023-06-20 ENCOUNTER — Encounter

## 2023-06-23 NOTE — Transitions of Care (Post Inpatient/ED Visit) (Signed)
   06/23/2023  Name: Hailey Greene MRN: [id number] DOB: 09-18-1950  Today's TOC FU Call Status: Today's TOC FU Call Status:: Unsuccessful Call (1st Attempt) Unsuccessful Call (1st Attempt) Date: 06/15/23  Attempted to reach the patient regarding the most recent Inpatient/ED visit.  Follow Up Plan: No further outreach attempts will be made at this time. We have been unable to contact the patient. Already seen in office Signature Karena Addison, LPN [facility] Nurse Health Advisor Direct Dial [phone]

## 2023-06-30 ENCOUNTER — Encounter (HOSPITAL_COMMUNITY): Payer: Self-pay

## 2023-06-30 ENCOUNTER — Emergency Department (HOSPITAL_COMMUNITY)

## 2023-06-30 ENCOUNTER — Inpatient Hospital Stay (HOSPITAL_COMMUNITY)
Admission: EM | Admit: 2023-06-30 | Discharge: 2023-07-02 | DRG: 314 | Disposition: A | Discharge status: Home / Self Care | Attending: Internal Medicine | Admitting: Internal Medicine

## 2023-06-30 ENCOUNTER — Other Ambulatory Visit: Payer: Self-pay

## 2023-06-30 DIAGNOSIS — Z8616 Personal history of COVID-19: Secondary | ICD-10-CM

## 2023-06-30 DIAGNOSIS — E782 Mixed hyperlipidemia: Secondary | ICD-10-CM | POA: Diagnosis present

## 2023-06-30 DIAGNOSIS — E1142 Type 2 diabetes mellitus with diabetic polyneuropathy: Secondary | ICD-10-CM

## 2023-06-30 DIAGNOSIS — E119 Type 2 diabetes mellitus without complications: Secondary | ICD-10-CM | POA: Diagnosis present

## 2023-06-30 DIAGNOSIS — Z9049 Acquired absence of other specified parts of digestive tract: Secondary | ICD-10-CM

## 2023-06-30 DIAGNOSIS — I959 Hypotension, unspecified: Principal | ICD-10-CM | POA: Diagnosis present

## 2023-06-30 DIAGNOSIS — F329 Major depressive disorder, single episode, unspecified: Secondary | ICD-10-CM | POA: Diagnosis present

## 2023-06-30 DIAGNOSIS — R4781 Slurred speech: Secondary | ICD-10-CM | POA: Diagnosis present

## 2023-06-30 DIAGNOSIS — K219 Gastro-esophageal reflux disease without esophagitis: Secondary | ICD-10-CM | POA: Diagnosis present

## 2023-06-30 DIAGNOSIS — R0902 Hypoxemia: Secondary | ICD-10-CM | POA: Diagnosis present

## 2023-06-30 DIAGNOSIS — M1990 Unspecified osteoarthritis, unspecified site: Secondary | ICD-10-CM | POA: Diagnosis present

## 2023-06-30 DIAGNOSIS — Z8701 Personal history of pneumonia (recurrent): Secondary | ICD-10-CM

## 2023-06-30 DIAGNOSIS — Z6836 Body mass index (BMI) 36.0-36.9, adult: Secondary | ICD-10-CM | POA: Diagnosis not present

## 2023-06-30 DIAGNOSIS — E861 Hypovolemia: Secondary | ICD-10-CM

## 2023-06-30 DIAGNOSIS — Z7985 Long-term (current) use of injectable non-insulin antidiabetic drugs: Secondary | ICD-10-CM

## 2023-06-30 DIAGNOSIS — Z90710 Acquired absence of both cervix and uterus: Secondary | ICD-10-CM

## 2023-06-30 DIAGNOSIS — Z7982 Long term (current) use of aspirin: Secondary | ICD-10-CM

## 2023-06-30 DIAGNOSIS — Z1152 Encounter for screening for COVID-19: Secondary | ICD-10-CM | POA: Diagnosis not present

## 2023-06-30 DIAGNOSIS — Z96653 Presence of artificial knee joint, bilateral: Secondary | ICD-10-CM | POA: Diagnosis present

## 2023-06-30 DIAGNOSIS — G2581 Restless legs syndrome: Secondary | ICD-10-CM | POA: Diagnosis present

## 2023-06-30 DIAGNOSIS — R296 Repeated falls: Secondary | ICD-10-CM

## 2023-06-30 DIAGNOSIS — R55 Syncope and collapse: Secondary | ICD-10-CM | POA: Diagnosis not present

## 2023-06-30 DIAGNOSIS — Z79899 Other long term (current) drug therapy: Secondary | ICD-10-CM

## 2023-06-30 DIAGNOSIS — G603 Idiopathic progressive neuropathy: Secondary | ICD-10-CM | POA: Diagnosis present

## 2023-06-30 DIAGNOSIS — Z8249 Family history of ischemic heart disease and other diseases of the circulatory system: Secondary | ICD-10-CM | POA: Diagnosis not present

## 2023-06-30 DIAGNOSIS — Z87440 Personal history of urinary (tract) infections: Secondary | ICD-10-CM

## 2023-06-30 DIAGNOSIS — E8729 Other acidosis: Secondary | ICD-10-CM | POA: Diagnosis present

## 2023-06-30 DIAGNOSIS — R569 Unspecified convulsions: Secondary | ICD-10-CM | POA: Diagnosis present

## 2023-06-30 DIAGNOSIS — F418 Other specified anxiety disorders: Secondary | ICD-10-CM | POA: Diagnosis present

## 2023-06-30 DIAGNOSIS — E876 Hypokalemia: Secondary | ICD-10-CM | POA: Diagnosis present

## 2023-06-30 DIAGNOSIS — Z7951 Long term (current) use of inhaled steroids: Secondary | ICD-10-CM

## 2023-06-30 DIAGNOSIS — N179 Acute kidney failure, unspecified: Secondary | ICD-10-CM | POA: Diagnosis present

## 2023-06-30 DIAGNOSIS — F39 Unspecified mood [affective] disorder: Secondary | ICD-10-CM | POA: Diagnosis present

## 2023-06-30 DIAGNOSIS — G9341 Metabolic encephalopathy: Secondary | ICD-10-CM | POA: Diagnosis present

## 2023-06-30 DIAGNOSIS — I10 Essential (primary) hypertension: Secondary | ICD-10-CM | POA: Diagnosis present

## 2023-06-30 DIAGNOSIS — Z87891 Personal history of nicotine dependence: Secondary | ICD-10-CM | POA: Diagnosis not present

## 2023-06-30 DIAGNOSIS — F5101 Primary insomnia: Secondary | ICD-10-CM | POA: Diagnosis present

## 2023-06-30 LAB — BASIC METABOLIC PANEL WITH GFR
Anion gap: 12 (ref 5–15)
BUN: 15 mg/dL (ref 8–23)
CO2: 25 mmol/L (ref 22–32)
Calcium: 8.9 mg/dL (ref 8.9–10.3)
Chloride: 102 mmol/L (ref 98–111)
Creatinine, Ser: 1.17 mg/dL — ABNORMAL HIGH (ref 0.44–1.00)
GFR, Estimated: 50 mL/min — ABNORMAL LOW (ref >60)
Glucose, Bld: 197 mg/dL — ABNORMAL HIGH (ref 70–99)
Potassium: 3.3 mmol/L — ABNORMAL LOW (ref 3.5–5.1)
Sodium: 139 mmol/L (ref 135–145)

## 2023-06-30 LAB — CBC
HCT: 41.4 % (ref 36.0–46.0)
Hemoglobin: 13.2 g/dL (ref 12.0–15.0)
MCH: 29.6 pg (ref 26.0–34.0)
MCHC: 31.9 g/dL (ref 30.0–36.0)
MCV: 92.8 fL (ref 80.0–100.0)
Platelets: 266 10*3/uL (ref 150–400)
RBC: 4.46 MIL/uL (ref 3.87–5.11)
RDW: 14.3 % (ref 11.5–15.5)
WBC: 9.3 10*3/uL (ref 4.0–10.5)
nRBC: 0 % (ref 0.0–0.2)

## 2023-06-30 LAB — CBG MONITORING, ED: Glucose-Capillary: 221 mg/dL — ABNORMAL HIGH (ref 70–99)

## 2023-06-30 MED ORDER — ENOXAPARIN SODIUM 40 MG/0.4ML IJ SOSY
40.0000 mg | PREFILLED_SYRINGE | INTRAMUSCULAR | Status: DC
  Administered 2023-07-01 – 2023-07-02 (×2): 40 mg via SUBCUTANEOUS
  Filled 2023-06-30 (×2): qty 0.4

## 2023-06-30 MED ORDER — LORAZEPAM 2 MG/ML IJ SOLN: 2.0000 mg | Freq: Once | INTRAMUSCULAR | Status: DC | PRN

## 2023-06-30 MED ORDER — LORAZEPAM 2 MG/ML IJ SOLN: 1.0000 mg | Freq: Once | INTRAMUSCULAR | Status: DC | PRN

## 2023-06-30 MED ORDER — SENNOSIDES-DOCUSATE SODIUM 8.6-50 MG PO TABS: 1.0000 | ORAL_TABLET | Freq: Every evening | ORAL | Status: DC | PRN

## 2023-06-30 MED ORDER — SODIUM CHLORIDE 0.9 % IV SOLN: 3000.0000 mg | Freq: Once | INTRAVENOUS | Status: DC

## 2023-06-30 MED ORDER — POTASSIUM CHLORIDE IN NACL 20-0.9 MEQ/L-% IV SOLN
INTRAVENOUS | Status: DC
  Filled 2023-06-30: qty 1000

## 2023-06-30 MED ORDER — LORAZEPAM 2 MG/ML IJ SOLN
1.0000 mg | Freq: Once | INTRAMUSCULAR | Status: AC
  Administered 2023-06-30: 1 mg via INTRAVENOUS
  Filled 2023-06-30: qty 1

## 2023-06-30 MED ORDER — LEVETIRACETAM IN NACL 1500 MG/100ML IV SOLN
1500.0000 mg | Freq: Once | INTRAVENOUS | Status: AC
  Administered 2023-06-30: 1500 mg via INTRAVENOUS
  Filled 2023-06-30: qty 100

## 2023-06-30 MED ORDER — LEVETIRACETAM IN NACL 1500 MG/100ML IV SOLN
1500.0000 mg | Freq: Once | INTRAVENOUS | Status: DC
  Filled 2023-06-30: qty 100

## 2023-06-30 MED ORDER — ACETAMINOPHEN 325 MG PO TABS: 650.0000 mg | ORAL_TABLET | Freq: Four times a day (QID) | ORAL | Status: DC | PRN

## 2023-06-30 MED ORDER — ACETAMINOPHEN 650 MG RE SUPP: 650.0000 mg | Freq: Four times a day (QID) | RECTAL | Status: DC | PRN

## 2023-06-30 NOTE — H&P (Addendum)
 PCP:   Blane Ohara, MD   Chief Complaint:  Tonic-clonic seizures  HPI: This is a 73 year old female with past medical history of MDD, HTN, HLD, RLS, T2DM, GERD.  She is brought in by family as she has had several falls over the past 3 to 4 days.  They are also several episodes of tonic-clonic seizures.  She is also had some confusion and persistent lipsmacking which is persistent and present even in the ER.  In the ER patient with persistent lipsmacking.  CT head normal.  No evidence of stroke.  Patient with persistent lipsmacking in the ER.  Neurologist Dr. Jerrell Belfast contacted.  Recommended admission to [facility] with EEG.  IV Ativan recommended to decrease lipsmacking.  Recommendation also loaded with Keppra.  A few minutes prior to my arrival while patient receiving IV Keppra.  Patient noted to be responsive only to painful stimuli.  Patient became hypotensive 80/60.  1 L LR bolus ordered.  Thought to be either due to seizure or medication related.  Review of Systems:  Unable to obtain.  Past Medical History: Past Medical History:  Diagnosis Date   2019 novel coronavirus disease (COVID-19) 12/31/2018   Acute hypoxemic respiratory failure due to COVID-19 (HCC)    Chicken pox    Depression    Diabetes (HCC)    GERD (gastroesophageal reflux disease)    Hypertension    Idiopathic progressive neuropathy    Major depressive disorder    Mixed hyperlipidemia    Neuropathy    Osteoarthritis    Pneumonia    Primary insomnia    Primary osteoarthritis of left knee 05/31/2019   RLS (restless legs syndrome)    S/P total knee replacement 07/15/2019   Status post total knee replacement 07/25/2019   Tachycardia    Urge incontinence    UTI (lower urinary tract infection)    Weakness    Past Surgical History:  Procedure Laterality Date   ABDOMINAL HYSTERECTOMY     APPENDECTOMY     BREAST BIOPSY Right 04/26/2023   [place] RT BREAST BX W LOC DEV 1ST LESION IMG BX SPEC US GUIDE 04/26/2023 [facility]  MAMMOGRAPHY   CHOLECYSTECTOMY     HERNIA REPAIR     REPLACEMENT TOTAL KNEE Right    TONSILLECTOMY     TOTAL KNEE ARTHROPLASTY Left 07/15/2019   Procedure: TOTAL KNEE ARTHROPLASTY;  Surgeon: Dannielle Huh, MD;  Location: [facility];  Service: Orthopedics;  Laterality: Left;    Medications: Prior to Admission medications   Medication Sig Start Date End Date Taking? Authorizing Provider  albuterol (VENTOLIN HFA) 108 (90 Base) MCG/ACT inhaler Inhale 2 puffs into the lungs every 6 (six) hours as needed for wheezing or shortness of breath. 06/19/23   Cox, Kirsten, MD  amitriptyline (ELAVIL) 25 MG tablet Take 1 tablet (25 mg total) by mouth every evening. 06/19/23   Cox, Kirsten, MD  ASPIRIN 81 PO Take 81 mg by mouth daily.    [provider]  blood glucose meter kit and supplies Dispense based on patient and insurance preference. Use up to four times daily as directed. (FOR ICD-10 E10.9, E11.9). 08/10/21   Janie Morning, NP  Blood Glucose Monitoring Suppl (ONETOUCH VERIO) w/Device KIT Use daily to check FBS 06/24/20   Cox, Kirsten, MD  budeson-glycopyrrolate-formoterol (BREZTRI AEROSPHERE) 160-9-4.8 MCG/ACT AERO Inhale 2 puffs into the lungs in the morning and at bedtime. 06/19/23   Cox, Fritzi Mandes, MD  Cyanocobalamin (VITAMIN B-12) 2500 MCG SUBL Place 1 tablet (2,500 mcg total)  under the tongue daily. DISSOLVE 1 TABLET UNDER THE TONGUE DAILY 06/19/23   Cox, Fritzi Mandes, MD  dicyclomine (BENTYL) 20 MG tablet Take 1 tablet (20 mg total) by mouth 4 (four) times daily as needed (intestinal spasms). 06/19/23   Cox, Fritzi Mandes, MD  Eszopiclone 3 MG TABS Take 1 tablet (3 mg total) by mouth at bedtime. 06/19/23   Cox, Fritzi Mandes, MD  ezetimibe (ZETIA) 10 MG tablet Take 1 tablet (10 mg total) by mouth daily. 06/19/23   Cox, Fritzi Mandes, MD  fluticasone [facility]) 50 MCG/ACT nasal spray SPRAY 2 SPRAYS INTO EACH NOSTRIL EVERY DAY 06/19/23   Cox, Fritzi Mandes, MD  glucose blood [facility] VERIO) test strip E11.42 Use new test strip each  time when checking FBS 06/02/22   Cox, Fritzi Mandes, MD  glucose blood test strip Use to test blood sugar once daily as directed 06/02/22     hydrOXYzine (VISTARIL) 25 MG capsule Take 1 capsule (25 mg total) by mouth at bedtime as needed. 06/19/23 09/17/23  Blane Ohara, MD  icosapent Ethyl (VASCEPA) 1 g capsule Take 2 capsules (2 g total) by mouth 2 (two) times daily. 06/19/23   Cox, Fritzi Mandes, MD  ipratropium-albuterol (DUONEB) 0.5-2.5 (3) MG/3ML SOLN Take 3 mLs by nebulization every 6 (six) hours as needed (SOB. WHEEZING). 06/19/23   Cox, Fritzi Mandes, MD  Lancets [facility] DELICA PLUS [place]) MISC E 11.42 Use new lancet each time when checking FBS 06/02/22   Cox, Fritzi Mandes, MD  Lancets 33G MISC Use to check blood sugar once daily as directed 06/02/22     loperamide (IMODIUM) 2 MG capsule Take by mouth. 04/29/23   [provider]  loratadine (CLARITIN) 10 MG tablet Take 1 tablet (10 mg total) by mouth daily. 03/08/23   Cox, Fritzi Mandes, MD  metoCLOPramide (REGLAN) 10 MG tablet Take 1 tablet (10 mg total) by mouth every 6 (six) hours as needed for nausea. 06/19/23   CoxFritzi Mandes, MD  Multiple Vitamin (MULTIVITAMIN WITH MINERALS) TABS tablet Take 1 tablet by mouth daily.    [provider]  omeprazole (PRILOSEC) 20 MG capsule Take 1 capsule (20 mg total) by mouth daily. 06/19/23   Cox, Fritzi Mandes, MD  ondansetron (ZOFRAN) 4 MG tablet TAKE 1 TABLET BY MOUTH EVERY 6 HOURS AS NEEDED FOR NAUSEA AND VOMITING 06/19/23   Cox, Kirsten, MD  pantoprazole (PROTONIX) 40 MG tablet Take 40 mg by mouth daily. 04/29/23   [provider]  phentermine (ADIPEX-P) 37.5 MG tablet Take 1 tablet (37.5 mg total) by mouth every morning. 06/19/23   Cox, Kirsten, MD  pregabalin (LYRICA) 300 MG capsule Take 1 capsule (300 mg total) by mouth 2 (two) times daily. 06/19/23   Blane Ohara, MD  promethazine (PHENERGAN) 25 MG tablet Take 1 tablet (25 mg total) by mouth every 6 (six) hours as needed for nausea or vomiting. 03/08/23   Cox,  Fritzi Mandes, MD  ramipril (ALTACE) 2.5 MG capsule Take 1 capsule (2.5 mg total) by mouth daily. 06/19/23   Cox, Fritzi Mandes, MD  rOPINIRole (REQUIP) 3 MG tablet Take 1 tablet (3 mg total) by mouth daily 1-3 hours before bedtime. 06/19/23   Cox, Fritzi Mandes, MD  rosuvastatin (CRESTOR) 40 MG tablet Take 1 tablet (40 mg total) by mouth daily. 06/19/23   Cox, Kirsten, MD  Semaglutide (RYBELSUS) 14 MG TABS Take 1 tablet (14 mg total) by mouth daily. 06/19/23   Cox, Fritzi Mandes, MD  solifenacin (VESICARE) 5 MG tablet Take 1 tablet (5 mg total) by mouth daily. 06/19/23   Blane Ohara, MD  Spacer/Aero-Holding Chambers DEVI USE WITH ALBUTEROL INHALER. 03/09/23   Cox, Fritzi Mandes, MD  topiramate (TOPAMAX) 50 MG tablet Take 1 tablet (50 mg total) by mouth daily. 06/19/23   Cox, Fritzi Mandes, MD  triamcinolone cream (KENALOG) 0.1 % APPLY 1 APPLICATION TOPICALLY TWICE DAILY 12/19/22   Cox, Kirsten, MD  vortioxetine HBr (TRINTELLIX) 20 MG TABS tablet Take 1 tablet (20 mg total) by mouth daily. 06/19/23   CoxFritzi Mandes, MD    Allergies:  No Known Allergies  Social History:  reports that she quit smoking about 13 years ago. Her smoking use included cigarettes. She started smoking about 25 years ago. She has a 24 pack-year smoking history. She has never used smokeless tobacco. She reports current alcohol use. She reports that she does not use drugs.  Family History: Family History  Problem Relation Age of Onset   Thyroid disease Mother    Cancer Mother    Migraines Mother    Brain cancer Father    Heart disease Maternal Grandmother    Diabetes Daughter     Physical Exam: Vitals:   06/30/23 2038 06/30/23 2045  BP: 101/80   Pulse: (!) 117   Resp: 19   Temp: 98.2 F (36.8 C)   TempSrc: Oral   SpO2: 95%   Weight:  70.8 kg  Height:  5\' 3"  (1.6 m)    General: Arouses to painful stimuli, well developed.  On 3 L oxygen satting 99% Eyes: PERRLA, pink conjunctiva, no scleral icterus ENT: Moist oral mucosa, neck supple,  Lungs: CTA  B/L, no use of accessory muscles Cardiovascular: RRR, no murmurs. No carotid bruits, no JVD Abdomen: soft, positive BS, NTND,  GU: not examined Neuro: Unable to assess due to current status Musculoskeletal: Unable to accurately assess Skin: no rash, no subcutaneous crepitation, no decubitus Psych: Responsive to painful stimuli  Labs on Admission:  Recent Labs    06/30/23 2100  NA 139  K 3.3*  CL 102  CO2 25  GLUCOSE 197*  BUN 15  CREATININE 1.17*  CALCIUM 8.9    Recent Labs    06/30/23 2100  WBC 9.3  HGB 13.2  HCT 41.4  MCV 92.8  PLT 266    Radiological Exams on Admission: CT HEAD WO CONTRAST ( ) Result Date: 06/30/2023 CLINICAL DATA:  History of recent falls, initial encounter EXAM: CT HEAD WITHOUT CONTRAST TECHNIQUE: Contiguous axial images were obtained from the base of the skull through the vertex without intravenous contrast. RADIATION DOSE REDUCTION: This exam was performed according to the departmental dose-optimization program which includes automated exposure control, adjustment of the mA and/or kV according to patient size and/or use of iterative reconstruction technique. COMPARISON:  02/19/2022 FINDINGS: Brain: No evidence of acute infarction, hemorrhage, hydrocephalus, extra-axial collection or mass lesion/mass effect. Mild atrophic changes are noted. Vascular: No hyperdense vessel or unexpected calcification. Skull: Stable mucosal retention cyst in the left maxillary antrum. Sinuses/Orbits: No acute finding. Other: None. IMPRESSION: Mild atrophic changes without acute abnormality. Electronically Signed   By: Alcide Clever M.D.   On: 06/30/2023 22:32    Assessment/Plan Present on Admission:  Possible seizures // subsequent falls // ongoing lipsmacking behavior -ABG ordered pH 7.3, CO2 64. FSBS 221 Patient more easily arousable but goes back to sleep sleep now.  Follows instructions, not confused.  Speech is appears to be a bit slurred.  BP improved but borderline  92/65.  BiPAP ordered  -Admit to [facility].  EEG ordered -Neurologist Dr. Jerrell Belfast contacted by EDP.  Recontacted  on arrival -Differential diagnosis also includes seizures, tardive dyskinesia, polypharmacy.  Patient on multiple medications including Reglan, amitriptyline, pregabalin, hydroxyzine, phentermine, Requip, Vortioxetine. -MRI brain ordered -Additional 500 cc liter bolus.  Continue with IV fluid hydration.  Urine and blood cultures ordered.  No clear evidence of infection.  No antibiotics initiated. -CK added   Hypokalemia -Repleting IV.  BMP in a.m.   AKI -IV fluid hydration.  BMP in a.m.   Hypoxia, on 3L -CXR normal -Resp panel ordered   T2DM //  Idiopathic progressive neuropathy -Sliding scale insulin ordered -Patient on weekly semaglutide   Essential hypertension, benign -BP meds on hold with hypotension   Mixed hyperlipidemia  RLS (restless legs syndrome)  Situational anxiety -Medications on hold  Joliana Claflin 06/30/2023, 11:12 PM

## 2023-06-30 NOTE — ED Provider Notes (Signed)
 [place] EMERGENCY DEPARTMENT AT [facility] Provider Note   CSN: [id number] Arrival date & time: 06/30/23  2033     History Chief Complaint  Patient presents with   Fall   Seizures    HPI Hailey Greene is a 73 y.o. female presenting for altered mental status. She has had multiple falls over the past 72 hours as well as many AMS with intermittent syncopal events concerning for possible seizure-like episodes.  Family describes that she has had persistent jaw smacking activity for 3 days and approximately 5-10 episodes of sudden full body tonic-clonic seizures. They deny fevers chills nausea vomiting some shortness of breath.  She has been sick multiple times over the past few months requiring multiple ED visits over the past 3 months.  Patient's recorded medical, surgical, social, medication list and allergies were reviewed in the Snapshot window as part of the initial history.   Review of Systems   Review of Systems  Constitutional:  Negative for chills and fever.  HENT:  Negative for ear pain and sore throat.   Eyes:  Negative for pain and visual disturbance.  Respiratory:  Negative for cough and shortness of breath.   Cardiovascular:  Negative for chest pain and palpitations.  Gastrointestinal:  Negative for abdominal pain and vomiting.  Genitourinary:  Negative for dysuria and hematuria.  Musculoskeletal:  Negative for arthralgias and back pain.  Skin:  Negative for color change and rash.  Neurological:  Positive for seizures. Negative for syncope.  Psychiatric/Behavioral:  Positive for agitation and confusion.   All other systems reviewed and are negative.   Physical Exam Updated Vital Signs BP (!) 114/95 (BP Location: Left Arm)   Pulse 77   Temp 97.6 F (36.4 C) (Oral)   Resp 17   Ht 5\' 3"  (1.6 m)   Wt 70.8 kg   SpO2 96%   BMI 27.63 kg/m  Physical Exam Constitutional:      General: She is not in acute distress.    Appearance: She is not  ill-appearing or toxic-appearing.  HENT:     Head: Normocephalic and atraumatic.  Eyes:     Extraocular Movements: Extraocular movements intact.     Pupils: Pupils are equal, round, and reactive to light.  Cardiovascular:     Rate and Rhythm: Normal rate.  Pulmonary:     Effort: No respiratory distress.  Abdominal:     General: Abdomen is flat.  Musculoskeletal:        General: No swelling, deformity or signs of injury.     Cervical back: Normal range of motion. No rigidity.  Skin:    General: Skin is warm and dry.  Neurological:     General: No focal deficit present.     Mental Status: She is alert and oriented to person, place, and time.     Motor: Weakness present.     Coordination: Coordination abnormal.  Psychiatric:        Mood and Affect: Mood normal.      ED Course/ Medical Decision Making/ A&P    Procedures .Critical Care  Performed by: Glyn Ade, MD Authorized by: Glyn Ade, MD   Critical care provider statement:    Critical care time (minutes):  30   Critical care was time spent personally by me on the following activities:  Development of treatment plan with patient or surrogate, discussions with consultants, evaluation of patient's response to treatment, examination of patient, ordering and review of laboratory studies, ordering and review  of radiographic studies, ordering and performing treatments and interventions, pulse oximetry, re-evaluation of patient's condition and review of old charts   Care discussed with: admitting provider      Medications Ordered in ED Medications  enoxaparin (LOVENOX) injection 40 mg (40 mg Subcutaneous Given 07/01/23 0910)  acetaminophen (TYLENOL) tablet 650 mg (has no administration in time range)    Or  acetaminophen (TYLENOL) suppository 650 mg (has no administration in time range)  senna-docusate (Senokot-S) tablet 1 tablet (has no administration in time range)  LORazepam (ATIVAN) injection 1 mg (has no  administration in time range)  insulin aspart (novoLOG) injection 0-15 Units ( Subcutaneous Not Given 07/01/23 1751)  insulin aspart (novoLOG) injection 0-5 Units ( Subcutaneous Not Given 07/01/23 2119)  rosuvastatin (CRESTOR) tablet 40 mg (40 mg Oral Given 07/01/23 1053)  pantoprazole (PROTONIX) EC tablet 40 mg (40 mg Oral Given 07/01/23 1053)  pregabalin (LYRICA) capsule 300 mg (300 mg Oral Given 07/01/23 1052)  LORazepam (ATIVAN) injection 1 mg (1 mg Intravenous Given 06/30/23 2255)  levETIRAcetam (KEPPRA) IVPB 1500 mg/ 100 mL premix (0 mg Intravenous Stopped 07/01/23 0034)  lactated ringers bolus 1,000 mL (1,000 mLs Intravenous New Bag/Given 07/01/23 0250)  gadobutrol (GADAVIST) 1 MMOL/ML injection 7 mL (7 mLs Intravenous Contrast Given 07/01/23 1623)    Medical Decision Making:   This is 73 year old female presenting with recurrent falls, episodes of altered mental status. Her history of present illness and physical exam findings are concerning for possible focal seizures with intermittent grand mal seizures.  There are some parts of the history that are less consistent given that she has relatively fast return to baseline.  It could be cardiac syncope as she has no prodrome with the episodes. However she has no history of psychiatric disease causing her lipsmacking. Consulted neurology for further care and management. They recommended Ativan and Keppra administration and admission to [facility] for EEG.  Reassessment: After Ativan and Keppra administration, she had resolution of the lipsmacking.  However she also had decrease in her GCS down to approximately 11.  Blood pressure down trended but improved with IV fluids. Evaluated at bedside with the hospitalist.  Plan is to observe her nadir and mental status and get an ABG prior to transfer.   Clinical Impression:  1. Seizure [facility])      Admit   Final Clinical Impression(s) / ED Diagnoses Final diagnoses:  Seizure [facility])    Rx / DC Orders ED  Discharge Orders     None         Glyn Ade, MD 07/01/23 2244

## 2023-06-30 NOTE — ED Provider Notes (Incomplete)
 [place] EMERGENCY DEPARTMENT AT [facility] Provider Note   CSN: [id number] Arrival date & time: 06/30/23  2033     History Chief Complaint  Patient presents with  . Fall  . Seizures    HPI Hailey Greene is a 73 y.o. female presenting for altered mental status. She has had multiple falls over the past 72 hours as well as many AMS with intermittent syncopal events concerning for possible seizure-like episodes.  Family describes that she has had persistent jaw smacking activity for 3 days and approximately 5-10 episodes of sudden full body tonic-clonic seizures. They deny fevers chills nausea vomiting some shortness of breath.  She has been sick multiple times over the past few months requiring multiple ED visits over the past 3 months.  Patient's recorded medical, surgical, social, medication list and allergies were reviewed in the Snapshot window as part of the initial history.   Review of Systems   Review of Systems  Constitutional:  Negative for chills and fever.  HENT:  Negative for ear pain and sore throat.   Eyes:  Negative for pain and visual disturbance.  Respiratory:  Negative for cough and shortness of breath.   Cardiovascular:  Negative for chest pain and palpitations.  Gastrointestinal:  Negative for abdominal pain and vomiting.  Genitourinary:  Negative for dysuria and hematuria.  Musculoskeletal:  Negative for arthralgias and back pain.  Skin:  Negative for color change and rash.  Neurological:  Positive for seizures. Negative for syncope.  Psychiatric/Behavioral:  Positive for agitation and confusion.   All other systems reviewed and are negative.   Physical Exam Updated Vital Signs BP 101/80 (BP Location: Left Arm)   Pulse (!) 117   Temp 98.2 F (36.8 C) (Oral)   Resp 19   Ht 5\' 3"  (1.6 m)   Wt 70.8 kg   SpO2 95%   BMI 27.63 kg/m  Physical Exam   ED Course/ Medical Decision Making/ A&P    Procedures Procedures   Medications  Ordered in ED Medications - No data to display  Medical Decision Making:    Hailey Greene is a 73 y.o. female who presented to the ED today with *** detailed above.     {crccomplexity:27900}  Complete initial physical exam performed, notably the patient  was ***.      Reviewed and confirmed nursing documentation for past medical history, family history, social history.    Initial Assessment:   With the patient's presentation of ***, most likely diagnosis is ***. Other diagnoses were considered including (but not limited to) ***. These are considered less likely due to history of present illness and physical exam findings.   {crccopa:27899}  Initial Plan:  ***  ***Screening labs including CBC and Metabolic panel to evaluate for infectious or metabolic etiology of disease.  ***Urinalysis with reflex culture ordered to evaluate for UTI or relevant urologic/nephrologic pathology.  ***CXR to evaluate for structural/infectious intrathoracic pathology.  ***EKG to evaluate for cardiac pathology. Objective evaluation as below reviewed with plan for close reassessment  Initial Study Results:   Laboratory  All laboratory results reviewed without evidence of clinically relevant pathology.   ***Exceptions include: ***   ***EKG EKG was reviewed independently. Rate, rhythm, axis, intervals all examined and without medically relevant abnormality. ST segments without concerns for elevations.    Radiology  All images reviewed independently. ***Agree with radiology report at this time.   No results found.   Consults:  Case discussed with ***.  Reassessment and Plan:   ***   ***  Clinical Impression: No diagnosis found.   Data Unavailable   Final Clinical Impression(s) / ED Diagnoses Final diagnoses:  None    Rx / DC Orders ED Discharge Orders     None

## 2023-06-30 NOTE — ED Notes (Signed)
 Pt is not on blood thinners, did hit the back of her head on one of her falls, no LOC

## 2023-06-30 NOTE — ED Triage Notes (Signed)
 Pt coming from home with report of 3 falls in past 2 days. Husband reports that patient just collapses, jaw clenches, and patients muscles tense up. Pt has also been stuttering since Wednesday and more so after episodes. Pt denies any weakness. Pt reports being cognitively present throughout episodes and denies LOC. Hx of neuropathy. NIH-0.

## 2023-07-01 ENCOUNTER — Inpatient Hospital Stay (HOSPITAL_COMMUNITY)

## 2023-07-01 DIAGNOSIS — N179 Acute kidney failure, unspecified: Secondary | ICD-10-CM | POA: Diagnosis not present

## 2023-07-01 DIAGNOSIS — I10 Essential (primary) hypertension: Secondary | ICD-10-CM | POA: Diagnosis not present

## 2023-07-01 DIAGNOSIS — E876 Hypokalemia: Secondary | ICD-10-CM | POA: Diagnosis not present

## 2023-07-01 DIAGNOSIS — R569 Unspecified convulsions: Secondary | ICD-10-CM | POA: Diagnosis not present

## 2023-07-01 LAB — BLOOD GAS, ARTERIAL
Acid-Base Excess: 4 mmol/L — ABNORMAL HIGH (ref 0.0–2.0)
Bicarbonate: 32.2 mmol/L — ABNORMAL HIGH (ref 20.0–28.0)
O2 Saturation: 98.2 %
Patient temperature: 37
pCO2 arterial: 64 mmHg — ABNORMAL HIGH (ref 32–48)
pH, Arterial: 7.31 — ABNORMAL LOW (ref 7.35–7.45)
pO2, Arterial: 99 mmHg (ref 83–108)

## 2023-07-01 LAB — BLOOD GAS, VENOUS
Acid-Base Excess: 4.5 mmol/L — ABNORMAL HIGH (ref 0.0–2.0)
Bicarbonate: 31.1 mmol/L — ABNORMAL HIGH (ref 20.0–28.0)
O2 Saturation: 84.9 %
Patient temperature: 37
pCO2, Ven: 55 mmHg (ref 44–60)
pH, Ven: 7.36 (ref 7.25–7.43)
pO2, Ven: 51 mmHg — ABNORMAL HIGH (ref 32–45)

## 2023-07-01 LAB — BASIC METABOLIC PANEL WITH GFR
Anion gap: 8 (ref 5–15)
BUN: 11 mg/dL (ref 8–23)
CO2: 25 mmol/L (ref 22–32)
Calcium: 8.7 mg/dL — ABNORMAL LOW (ref 8.9–10.3)
Chloride: 106 mmol/L (ref 98–111)
Creatinine, Ser: 0.83 mg/dL (ref 0.44–1.00)
GFR, Estimated: >60 mL/min (ref >60)
Glucose, Bld: 50 mg/dL — ABNORMAL LOW (ref 70–99)
Potassium: 4.3 mmol/L (ref 3.5–5.1)
Sodium: 139 mmol/L (ref 135–145)

## 2023-07-01 LAB — RESP PANEL BY RT-PCR (RSV, FLU A&B, COVID)  RVPGX2
Influenza A by PCR: NEGATIVE
Influenza B by PCR: NEGATIVE
Resp Syncytial Virus by PCR: NEGATIVE
SARS Coronavirus 2 by RT PCR: NEGATIVE

## 2023-07-01 LAB — GLUCOSE, CAPILLARY
Glucose-Capillary: 129 mg/dL — ABNORMAL HIGH (ref 70–99)
Glucose-Capillary: 83 mg/dL (ref 70–99)
Glucose-Capillary: 114 mg/dL — ABNORMAL HIGH (ref 70–99)
Glucose-Capillary: 116 mg/dL — ABNORMAL HIGH (ref 70–99)

## 2023-07-01 LAB — CBC WITH DIFFERENTIAL/PLATELET
Abs Immature Granulocytes: 0.06 10*3/uL (ref 0.00–0.07)
Basophils Absolute: 0 10*3/uL (ref 0.0–0.1)
Basophils Relative: 0 %
Eosinophils Absolute: 0.1 10*3/uL (ref 0.0–0.5)
Eosinophils Relative: 1 %
HCT: 37 % (ref 36.0–46.0)
Hemoglobin: 12 g/dL (ref 12.0–15.0)
Immature Granulocytes: 1 %
Lymphocytes Relative: 25 %
Lymphs Abs: 2.1 10*3/uL (ref 0.7–4.0)
MCH: 30.2 pg (ref 26.0–34.0)
MCHC: 32.4 g/dL (ref 30.0–36.0)
MCV: 93 fL (ref 80.0–100.0)
Monocytes Absolute: 0.5 10*3/uL (ref 0.1–1.0)
Monocytes Relative: 6 %
Neutro Abs: 5.5 10*3/uL (ref 1.7–7.7)
Neutrophils Relative %: 67 %
Platelets: 246 10*3/uL (ref 150–400)
RBC: 3.98 MIL/uL (ref 3.87–5.11)
RDW: 14.6 % (ref 11.5–15.5)
WBC: 8.2 10*3/uL (ref 4.0–10.5)
nRBC: 0 % (ref 0.0–0.2)

## 2023-07-01 LAB — CK: Total CK: 40 U/L (ref 38–234)

## 2023-07-01 LAB — CBG MONITORING, ED: Glucose-Capillary: 122 mg/dL — ABNORMAL HIGH (ref 70–99)

## 2023-07-01 MED ORDER — GADOBUTROL 1 MMOL/ML IV SOLN
7.0000 mL | Freq: Once | INTRAVENOUS | Status: AC | PRN
  Administered 2023-07-01: 7 mL via INTRAVENOUS

## 2023-07-01 MED ORDER — PANTOPRAZOLE SODIUM 40 MG PO TBEC
40.0000 mg | DELAYED_RELEASE_TABLET | Freq: Every day | ORAL | Status: DC
  Administered 2023-07-01 – 2023-07-02 (×2): 40 mg via ORAL
  Filled 2023-07-01 (×2): qty 1

## 2023-07-01 MED ORDER — PREGABALIN 100 MG PO CAPS: 300.0000 mg | ORAL_CAPSULE | Freq: Two times a day (BID) | ORAL | Status: DC

## 2023-07-01 MED ORDER — PREGABALIN 100 MG PO CAPS
300.0000 mg | ORAL_CAPSULE | Freq: Every day | ORAL | Status: DC
  Administered 2023-07-01 – 2023-07-02 (×2): 300 mg via ORAL
  Filled 2023-07-01 (×2): qty 3

## 2023-07-01 MED ORDER — ROSUVASTATIN CALCIUM 20 MG PO TABS
40.0000 mg | ORAL_TABLET | Freq: Every day | ORAL | Status: DC
  Administered 2023-07-01 – 2023-07-02 (×2): 40 mg via ORAL
  Filled 2023-07-01 (×2): qty 2

## 2023-07-01 MED ORDER — LEVETIRACETAM 500 MG PO TABS: 1000.0000 mg | ORAL_TABLET | Freq: Two times a day (BID) | ORAL | Status: DC

## 2023-07-01 MED ORDER — INSULIN ASPART 100 UNIT/ML IJ SOLN
0.0000 [IU] | Freq: Three times a day (TID) | INTRAMUSCULAR | Status: DC
  Administered 2023-07-01: 2 [IU] via SUBCUTANEOUS
  Filled 2023-07-01: qty 0.15

## 2023-07-01 MED ORDER — INSULIN ASPART 100 UNIT/ML IJ SOLN
0.0000 [IU] | Freq: Every day | INTRAMUSCULAR | Status: DC
Start: 2023-07-01 — End: 2023-07-02
  Filled 2023-07-01: qty 0.05

## 2023-07-01 MED ORDER — LACTATED RINGERS IV BOLUS
1000.0000 mL | Freq: Once | INTRAVENOUS | Status: AC
  Administered 2023-07-01: 1000 mL via INTRAVENOUS

## 2023-07-01 MED ORDER — POTASSIUM CHLORIDE CRYS ER 20 MEQ PO TBCR: 40.0000 meq | EXTENDED_RELEASE_TABLET | Freq: Once | ORAL | Status: DC

## 2023-07-01 NOTE — Progress Notes (Signed)
 LTM EEG hooked up and running - no initial skin breakdown - push button tested - [facility] monitoring. MRI compatible leads.

## 2023-07-01 NOTE — Procedures (Signed)
 Patient Name: Hailey Greene  MRN: [id number]  Epilepsy Attending: Charlsie Quest  Referring Physician/Provider: Gery Pray, MD  Date: 07/01/2023 Duration: 22.14 mins  Patient history: 73yo f with seizure like activity. EEG to evaluate for seizure  Level of alertness: Awake  AEDs during EEG study: LEV, PGB  Technical aspects: This EEG study was done with scalp electrodes positioned according to the 10-20 International system of electrode placement. Electrical activity was reviewed with band pass filter of 1-70Hz , sensitivity of 7 uV/mm, display speed of 71mm/sec with a 60Hz  notched filter applied as appropriate. EEG data were recorded continuously and digitally stored.  Video monitoring was available and reviewed as appropriate.  Description: The posterior dominant rhythm consists of 9 Hz activity of moderate voltage (25-35 uV) seen predominantly in posterior head regions, symmetric and reactive to eye opening and eye closing. Photic driving ws not seen during photic stimulation. Hyperventilation was not performed.     IMPRESSION: This study is within normal limits. No seizures or epileptiform discharges were seen throughout the recording.  A normal interictal EEG does not exclude the diagnosis of epilepsy.  Hailey Greene

## 2023-07-01 NOTE — Plan of Care (Signed)
  Problem: Metabolic: Goal: Ability to maintain appropriate glucose levels will improve Outcome: Progressing   Problem: Nutritional: Goal: Maintenance of adequate nutrition will improve Outcome: Progressing   Problem: Skin Integrity: Goal: Risk for impaired skin integrity will decrease Outcome: Progressing   Problem: Tissue Perfusion: Goal: Adequacy of tissue perfusion will improve Outcome: Progressing   Problem: Clinical Measurements: Goal: Respiratory complications will improve Outcome: Progressing

## 2023-07-01 NOTE — Progress Notes (Signed)
   07/01/23 0146  Vent Select  Invasive or Noninvasive Noninvasive  Adult Vent Y  Adult Ventilator Settings  Vent Type Servo U  Vent Mode BIPAP;Spontaneous;PSV  FiO2 (%) 30 %  IPAP 10 cmH20  EPAP 5 cmH20  Pressure Support 5 cmH20 (above PEEP 5)  PEEP 5 cmH20  Adult Ventilator Alarms  Alarms On Y  Ve High Alarm 20 L/min  Ve Low Alarm 4 L/min  Resp Rate High Alarm 40 br/min  Resp Rate Low Alarm 5  PEEP Low Alarm 2 cmH2O  Press High Alarm 25 cmH2O  T Apnea 20 sec(s)  VAP Prevention  HOB> 30 Degrees Y  Breath Sounds  Bilateral Breath Sounds Clear;Diminished  R Upper  Breath Sounds Clear  L Upper Breath Sounds Clear  R Lower Breath Sounds Clear;Diminished  L Lower Breath Sounds Clear;Diminished

## 2023-07-01 NOTE — Progress Notes (Addendum)
 PROGRESS NOTE        PATIENT DETAILS Name: Hailey Greene Age: 73 y.o. Sex: female Date of Birth: 05-30-1950 Admit Date: 06/30/2023 Admitting Physician Gery Pray, MD PCP:Cox, Fritzi Mandes, MD  Brief Summary: Patient is a 73 y.o.  female with history of DM-2, HTN, depression, RLS-who presented with multiple falls/slurred speech/possible seizure-like activity.  Significant events: 4/4>> admit to [facility]  Significant studies: 4/4>> CT head: No acute abnormalities.  Significant microbiology data: 4/5>> COVID/influenza/RSV PCR: Negative 4/5>> blood cultures: Pending  Procedures: None  Consults: Neurology  Subjective: Awake/alert-answers all questions appropriately-some slurring to her speech.  Objective: Vitals: Blood pressure (!) 135/112, pulse 79, temperature 97.6 F (36.4 C), temperature source Oral, resp. rate 17, height 5\' 3"  (1.6 m), weight 70.8 kg, SpO2 99%.   Exam: Gen Exam:Alert awake-not in any distress HEENT:atraumatic, normocephalic Chest: B/L clear to auscultation anteriorly CVS:S1S2 regular Abdomen:soft non tender, non distended Extremities:no edema Neurology: Non focal Skin: no rash  Pertinent Labs/Radiology:    Latest Ref Rng & Units 06/30/2023    9:00 PM 06/16/2023    5:29 PM 03/08/2023    9:54 AM  CBC  WBC 4.0 - 10.5 K/uL 9.3  15.3  15.4   Hemoglobin 12.0 - 15.0 g/dL 16.1  09.6  04.5   Hematocrit 36.0 - 46.0 % 41.4  44.0  46.1   Platelets 150 - 400 K/uL 266  314  375     Lab Results  Component Value Date   NA 139 06/30/2023   K 3.3 (L) 06/30/2023   CL 102 06/30/2023   CO2 25 06/30/2023      Assessment/Plan: Possible seizure like episodes-some slurred speech Received 1 dose of IV Ativan and Keppra load in the ED-currently being monitored off all AEDs Awaiting MRI brain Being hooked up to LTM EEG this morning Await further recommendations from neurology Seizure precautions.  Hypokalemia Repleted-awaiting  repeat labs this morning.  DM-2 (A1c 5.9 on 05/08/2022) CBGs stable SSI  HTN BP stable All antihypertensives remain on hold-resume oral neck several days depending on how her blood pressure does.  HLD Resume statin  Peripheral neuropathy Resume Lyrica  RLS Requip  Mood disorder Holding Topamax/Trintellix for now-resume next several days based on EEG/neurostatus.  GERD PPI  BMI: Estimated body mass index is 27.63 kg/m as calculated from the following:   Height as of this encounter: 5\' 3"  (1.6 m).   Weight as of this encounter: 70.8 kg.   Code status:   Code Status: Full Code   DVT Prophylaxis: enoxaparin (LOVENOX) injection 40 mg Start: 07/01/23 1000   Family Communication: None at bedside   Disposition Plan: Status is: Inpatient Remains inpatient appropriate because: Severity of illness   Planned Discharge Destination:Home   Diet: Diet Order             Diet heart healthy/carb modified Room service appropriate? Yes; Fluid consistency: Thin  Diet effective now                     Antimicrobial agents: Anti-infectives (From admission, onward)    None        MEDICATIONS: Scheduled Meds:  enoxaparin (LOVENOX) injection  40 mg Subcutaneous Q24H   insulin aspart  0-15 Units Subcutaneous TID WC   insulin aspart  0-5 Units Subcutaneous QHS   Continuous Infusions:  0.9 % NaCl with  KCl 20 mEq / L 75 mL/hr at 07/01/23 0050   PRN Meds:.acetaminophen **OR** acetaminophen, LORazepam, senna-docusate   I have personally reviewed following labs and imaging studies  LABORATORY DATA: CBC: Recent Labs  Lab 06/30/23 2100  WBC 9.3  HGB 13.2  HCT 41.4  MCV 92.8  PLT 266    Basic Metabolic Panel: Recent Labs  Lab 06/30/23 2100  NA 139  K 3.3*  CL 102  CO2 25  GLUCOSE 197*  BUN 15  CREATININE 1.17*  CALCIUM 8.9    GFR: Estimated Creatinine Clearance: 41 mL/min (A) (by C-G formula based on SCr of 1.17 mg/dL (H)).  Liver Function  Tests: No results for input(s): "AST", "ALT", "ALKPHOS", "BILITOT", "PROT", "ALBUMIN" in the last 168 hours. No results for input(s): "LIPASE", "AMYLASE" in the last 168 hours. No results for input(s): "AMMONIA" in the last 168 hours.  Coagulation Profile: No results for input(s): "INR", "PROTIME" in the last 168 hours.  Cardiac Enzymes: No results for input(s): "CKTOTAL", "CKMB", "CKMBINDEX", "TROPONINI" in the last 168 hours.  BNP (last 3 results) No results for input(s): "PROBNP" in the last 8760 hours.  Lipid Profile: No results for input(s): "CHOL", "HDL", "LDLCALC", "TRIG", "CHOLHDL", "LDLDIRECT" in the last 72 hours.  Thyroid Function Tests: No results for input(s): "TSH", "T4TOTAL", "FREET4", "T3FREE", "THYROIDAB" in the last 72 hours.  Anemia Panel: No results for input(s): "VITAMINB12", "FOLATE", "FERRITIN", "TIBC", "IRON", "RETICCTPCT" in the last 72 hours.  Urine analysis:    Component Value Date/Time   COLORURINE YELLOW 06/16/2023 1938   APPEARANCEUR CLEAR 06/16/2023 1938   LABSPEC 1.021 06/16/2023 1938   PHURINE 5.0 06/16/2023 1938   GLUCOSEU NEGATIVE 06/16/2023 1938   HGBUR NEGATIVE 06/16/2023 1938   BILIRUBINUR NEGATIVE 06/16/2023 1938   BILIRUBINUR negative 09/25/2020 1041   BILIRUBINUR Negative 08/13/2019 1027   KETONESUR NEGATIVE 06/16/2023 1938   PROTEINUR NEGATIVE 06/16/2023 1938   UROBILINOGEN 0.2 09/25/2020 1041   NITRITE NEGATIVE 06/16/2023 1938   LEUKOCYTESUR NEGATIVE 06/16/2023 1938    Sepsis Labs: Lactic Acid, Venous    Component Value Date/Time   LATICACIDVEN 0.8 12/31/2018 1609    MICROBIOLOGY: Recent Results (from the past 240 hours)  Resp panel by RT-PCR (RSV, Flu A&B, Covid) Anterior Nasal Swab     Status: None   Collection Time: 07/01/23 12:39 AM   Specimen: Anterior Nasal Swab  Result Value Ref Range Status   SARS Coronavirus 2 by RT PCR NEGATIVE NEGATIVE Final    Comment: (NOTE) SARS-CoV-2 target nucleic acids are NOT  DETECTED.  The SARS-CoV-2 RNA is generally detectable in upper respiratory specimens during the acute phase of infection. The lowest concentration of SARS-CoV-2 viral copies this assay can detect is 138 copies/mL. A negative result does not preclude SARS-Cov-2 infection and should not be used as the sole basis for treatment or other patient management decisions. A negative result may occur with  improper specimen collection/handling, submission of specimen other than nasopharyngeal swab, presence of viral mutation(s) within the areas targeted by this assay, and inadequate number of viral copies(<138 copies/mL). A negative result must be combined with clinical observations, patient history, and epidemiological information. The expected result is Negative.  Fact Sheet for Patients:  BloggerCourse.com  Fact Sheet for Healthcare Providers:  SeriousBroker.it  This test is no t yet approved or cleared by the [place] FDA and  has been authorized for detection and/or diagnosis of SARS-CoV-2 by FDA under an Emergency Use Authorization (EUA). This EUA will remain  in effect (  meaning this test can be used) for the duration of the COVID-19 declaration under Section 564(b)(1) of the Act, 21 U.S.C.section 360bbb-3(b)(1), unless the authorization is terminated  or revoked sooner.       Influenza A by PCR NEGATIVE NEGATIVE Final   Influenza B by PCR NEGATIVE NEGATIVE Final    Comment: (NOTE) The Xpert Xpress SARS-CoV-2/FLU/RSV plus assay is intended as an aid in the diagnosis of influenza from Nasopharyngeal swab specimens and should not be used as a sole basis for treatment. Nasal washings and aspirates are unacceptable for Xpert Xpress SARS-CoV-2/FLU/RSV testing.  Fact Sheet for Patients: BloggerCourse.com  Fact Sheet for Healthcare Providers: SeriousBroker.it  This test is not yet  approved or cleared by the [place] FDA and has been authorized for detection and/or diagnosis of SARS-CoV-2 by FDA under an Emergency Use Authorization (EUA). This EUA will remain in effect (meaning this test can be used) for the duration of the COVID-19 declaration under Section 564(b)(1) of the Act, 21 U.S.C. section 360bbb-3(b)(1), unless the authorization is terminated or revoked.     Resp Syncytial Virus by PCR NEGATIVE NEGATIVE Final    Comment: (NOTE) Fact Sheet for Patients: BloggerCourse.com  Fact Sheet for Healthcare Providers: SeriousBroker.it  This test is not yet approved or cleared by the [place] FDA and has been authorized for detection and/or diagnosis of SARS-CoV-2 by FDA under an Emergency Use Authorization (EUA). This EUA will remain in effect (meaning this test can be used) for the duration of the COVID-19 declaration under Section 564(b)(1) of the Act, 21 U.S.C. section 360bbb-3(b)(1), unless the authorization is terminated or revoked.  Performed at [facility], [address] [place], [place] [address]     RADIOLOGY STUDIES/RESULTS: CT HEAD WO CONTRAST ( ) Result Date: 06/30/2023 CLINICAL DATA:  History of recent falls, initial encounter EXAM: CT HEAD WITHOUT CONTRAST TECHNIQUE: Contiguous axial images were obtained from the base of the skull through the vertex without intravenous contrast. RADIATION DOSE REDUCTION: This exam was performed according to the departmental dose-optimization program which includes automated exposure control, adjustment of the mA and/or kV according to patient size and/or use of iterative reconstruction technique. COMPARISON:  02/19/2022 FINDINGS: Brain: No evidence of acute infarction, hemorrhage, hydrocephalus, extra-axial collection or mass lesion/mass effect. Mild atrophic changes are noted. Vascular: No hyperdense vessel or unexpected calcification.  Skull: Stable mucosal retention cyst in the left maxillary antrum. Sinuses/Orbits: No acute finding. Other: None. IMPRESSION: Mild atrophic changes without acute abnormality. Electronically Signed   By: Alcide Clever M.D.   On: 06/30/2023 22:32     LOS: 1 day   Jeoffrey Massed, MD  [facility]    To contact the attending provider between 7A-7P or the covering provider during after hours 7P-7A, please log into the web site [email or web address] and access using universal  password for that web site. If you do not have the password, please call the hospital operator.  07/01/2023, 9:37 AM

## 2023-07-01 NOTE — Progress Notes (Signed)
 EEG complete - results pending

## 2023-07-01 NOTE — ED Provider Notes (Signed)
 1:23 AM Requested by hospitalist to reassess the patient.  Concern for ongoing somnolence.  ABG with mild respiratory acidosis.  In brief presented with lipsmacking episodes.  New for the patient.  She was loaded with Keppra and Ativan and got significantly somnolent after these medications.  She also had some low blood pressure.  Blood pressure improved with fluids.  Hospitalist requested I assess for possible need for intubation.  On my assessment, patient is sleeping.  Patient arousable to tactile and verbal stimulus.  Upon arousal, she is oriented to herself, place, and time.  She is able to tell me that she is sleepy and it is past her bedtime.  Hospitalist indicated that the respiratory therapist did not want to start BiPAP because of patient's somnolence.  Discussed with the hospitalist that I thought BiPAP was appropriate if she wanted to blow off pCO2 to help with the patient's mental status as the patient clearly is arousable; however also feel like the patient will likely improve gradually on her own with time.  Oxygen levels reassuring.  Physical Exam  BP 101/80 (BP Location: Left Arm)   Pulse (!) 117   Temp 98.2 F (36.8 C) (Oral)   Resp 19   Ht 1.6 m (5\' 3" )   Wt 70.8 kg   SpO2 95%   BMI 27.63 kg/m   Physical Exam Sleeping but arousable Oriented x 3 Procedures  Procedures  ED Course / MDM    Medical Decision Making Amount and/or Complexity of Data Reviewed Labs: ordered. Radiology: ordered.  Risk Prescription drug management. Decision regarding hospitalization.          Shon Baton, MD 07/01/23 [phone]

## 2023-07-01 NOTE — Consult Note (Signed)
 NEUROLOGY CONSULT NOTE   Date of service: July 01, 2023 Patient Name: Hailey Greene MRN:  [id number] DOB:  [birth year] Chief Complaint: "Concern for seizures" Requesting Provider: Gery Pray, MD Patient seen at [facility], ER bed 7  History of Present Illness  Hailey Greene is a 73 y.o. female with hx of hypertension, depression, RLS, diabetes, presented to the emergency department for evaluation of multiple falls over the past 72 hours.  No family at bedside patient unable to provide history. History obtained from chart review. Per the EDP, patient has had multiple spells concerning for syncope versus seizure activity with altered mental status.  Family described that she had lipsmacking followed by 5-10 episodes of sudden body tonic-clonic seizures.  No illness or sickness preceding this.  No prior history of similar episodes. Case discussed with me over the phone.  Recommended 1 dose of Ativan and loaded with Keppra. While awaiting transfer to [facility], she became less responsive and somewhat hypotensive.  Assessed by EDP again-arousable to tactile and verbal stimulation.  Started on BiPAP.   ROS  Unable to ascertain due to current mentation  Past History   Past Medical History:  Diagnosis Date   2019 novel coronavirus disease (COVID-19) 12/31/2018   Acute hypoxemic respiratory failure due to COVID-19 (HCC)    Chicken pox    Depression    Diabetes (HCC)    GERD (gastroesophageal reflux disease)    Hypertension    Idiopathic progressive neuropathy    Major depressive disorder    Mixed hyperlipidemia    Neuropathy    Osteoarthritis    Pneumonia    Primary insomnia    Primary osteoarthritis of left knee 05/31/2019   RLS (restless legs syndrome)    S/P total knee replacement 07/15/2019   Status post total knee replacement 07/25/2019   Tachycardia    Urge incontinence    UTI (lower urinary tract infection)    Weakness     Past Surgical History:   Procedure Laterality Date   ABDOMINAL HYSTERECTOMY     APPENDECTOMY     BREAST BIOPSY Right 04/26/2023   [place] RT BREAST BX W LOC DEV 1ST LESION IMG BX SPEC US GUIDE 04/26/2023 [facility]   CHOLECYSTECTOMY     HERNIA REPAIR     REPLACEMENT TOTAL KNEE Right    TONSILLECTOMY     TOTAL KNEE ARTHROPLASTY Left 07/15/2019   Procedure: TOTAL KNEE ARTHROPLASTY;  Surgeon: Dannielle Huh, MD;  Location: [facility];  Service: Orthopedics;  Laterality: Left;    Family History: Family History  Problem Relation Age of Onset   Thyroid disease Mother    Cancer Mother    Migraines Mother    Brain cancer Father    Heart disease Maternal Grandmother    Diabetes Daughter     Social History  reports that she quit smoking about 13 years ago. Her smoking use included cigarettes. She started smoking about 25 years ago. She has a 24 pack-year smoking history. She has never used smokeless tobacco. She reports current alcohol use. She reports that she does not use drugs.  No Known Allergies  Medications   Current Facility-Administered Medications:    0.9 % NaCl with KCl 20 mEq/ L  infusion, , Intravenous, Continuous, Crosley, Debby, MD, Last Rate: 75 mL/hr at 07/01/23 0050, New Bag at 07/01/23 0050   acetaminophen (TYLENOL) tablet 650 mg, 650 mg, Oral, Q6H PRN **OR** acetaminophen (TYLENOL) suppository 650 mg, 650 mg, Rectal, Q6H PRN, Crosley, Debby,  MD   enoxaparin (LOVENOX) injection 40 mg, 40 mg, Subcutaneous, Q24H, Crosley, Debby, MD   insulin aspart (novoLOG) injection 0-15 Units, 0-15 Units, Subcutaneous, TID WC, Crosley, Debby, MD   insulin aspart (novoLOG) injection 0-5 Units, 0-5 Units, Subcutaneous, QHS, Crosley, Debby, MD   levETIRAcetam (KEPPRA) tablet 1,000 mg, 1,000 mg, Oral, BID, Crosley, Debby, MD   LORazepam (ATIVAN) injection 1 mg, 1 mg, Intravenous, Once PRN, Crosley, Debby, MD   senna-docusate (Senokot-S) tablet 1 tablet, 1 tablet, Oral, QHS PRN, Crosley, Debby, MD  Current Outpatient  Medications:    SYMBICORT 160-4.5 MCG/ACT inhaler, Inhale 2 puffs into the lungs 2 (two) times daily., Disp: , Rfl:    albuterol (VENTOLIN HFA) 108 (90 Base) MCG/ACT inhaler, Inhale 2 puffs into the lungs every 6 (six) hours as needed for wheezing or shortness of breath., Disp: 8 g, Rfl: 2   amitriptyline (ELAVIL) 25 MG tablet, Take 1 tablet (25 mg total) by mouth every evening., Disp: 90 tablet, Rfl: 1   ASPIRIN 81 PO, Take 81 mg by mouth daily., Disp: , Rfl:    blood glucose meter kit and supplies, Dispense based on patient and insurance preference. Use up to four times daily as directed. (FOR ICD-10 E10.9, E11.9)., Disp: 1 each, Rfl: 0   Blood Glucose Monitoring Suppl (ONETOUCH VERIO) w/Device KIT, Use daily to check FBS, Disp: 1 kit, Rfl: 0   budeson-glycopyrrolate-formoterol (BREZTRI AEROSPHERE) 160-9-4.8 MCG/ACT AERO, Inhale 2 puffs into the lungs in the morning and at bedtime., Disp: , Rfl:    Cyanocobalamin (VITAMIN B-12) 2500 MCG SUBL, Place 1 tablet (2,500 mcg total) under the tongue daily. DISSOLVE 1 TABLET UNDER THE TONGUE DAILY, Disp: 90 tablet, Rfl: 3   dicyclomine (BENTYL) 20 MG tablet, Take 1 tablet (20 mg total) by mouth 4 (four) times daily as needed (intestinal spasms)., Disp: 120 tablet, Rfl: 1   Eszopiclone 3 MG TABS, Take 1 tablet (3 mg total) by mouth at bedtime., Disp: 30 tablet, Rfl: 2   ezetimibe (ZETIA) 10 MG tablet, Take 1 tablet (10 mg total) by mouth daily., Disp: 90 tablet, Rfl: 0   fluticasone (FLONASE) 50 MCG/ACT nasal spray, SPRAY 2 SPRAYS INTO EACH NOSTRIL EVERY DAY, Disp: 48 g, Rfl: 2   glucose blood (ONETOUCH VERIO) test strip, E11.42 Use new test strip each time when checking FBS, Disp: 100 each, Rfl: 2   glucose blood test strip, Use to test blood sugar once daily as directed, Disp: 100 each, Rfl: 0   hydrOXYzine (VISTARIL) 25 MG capsule, Take 1 capsule (25 mg total) by mouth at bedtime as needed., Disp: 90 capsule, Rfl: 0   icosapent Ethyl (VASCEPA) 1 g  capsule, Take 2 capsules (2 g total) by mouth 2 (two) times daily., Disp: 120 capsule, Rfl: 0   ipratropium-albuterol (DUONEB) 0.5-2.5 (3) MG/3ML SOLN, Take 3 mLs by nebulization every 6 (six) hours as needed (SOB. WHEEZING)., Disp: 360 mL, Rfl: 1   Lancets (ONETOUCH DELICA PLUS LANCET33G) MISC, E 11.42 Use new lancet each time when checking FBS, Disp: 100 each, Rfl: 2   Lancets 33G MISC, Use to check blood sugar once daily as directed, Disp: 100 each, Rfl: 0   loperamide (IMODIUM) 2 MG capsule, Take by mouth., Disp: , Rfl:    loratadine (CLARITIN) 10 MG tablet, Take 1 tablet (10 mg total) by mouth daily., Disp: 90 tablet, Rfl: 3   metoCLOPramide (REGLAN) 10 MG tablet, Take 1 tablet (10 mg total) by mouth every 6 (six) hours as needed  for nausea., Disp: 30 tablet, Rfl: 0   Multiple Vitamin (MULTIVITAMIN WITH MINERALS) TABS tablet, Take 1 tablet by mouth daily., Disp: , Rfl:    omeprazole (PRILOSEC) 20 MG capsule, Take 1 capsule (20 mg total) by mouth daily., Disp: 90 capsule, Rfl: 0   ondansetron (ZOFRAN) 4 MG tablet, TAKE 1 TABLET BY MOUTH EVERY 6 HOURS AS NEEDED FOR NAUSEA AND VOMITING, Disp: 90 tablet, Rfl: 0   pantoprazole (PROTONIX) 40 MG tablet, Take 40 mg by mouth daily., Disp: , Rfl:    phentermine (ADIPEX-P) 37.5 MG tablet, Take 1 tablet (37.5 mg total) by mouth every morning., Disp: 30 tablet, Rfl: 0   pregabalin (LYRICA) 300 MG capsule, Take 1 capsule (300 mg total) by mouth 2 (two) times daily., Disp: 60 capsule, Rfl: 2   promethazine (PHENERGAN) 25 MG tablet, Take 1 tablet (25 mg total) by mouth every 6 (six) hours as needed for nausea or vomiting., Disp: 30 tablet, Rfl: 0   ramipril (ALTACE) 2.5 MG capsule, Take 1 capsule (2.5 mg total) by mouth daily., Disp: 90 capsule, Rfl: 0   rOPINIRole (REQUIP) 3 MG tablet, Take 1 tablet (3 mg total) by mouth daily 1-3 hours before bedtime., Disp: 90 tablet, Rfl: 0   rosuvastatin (CRESTOR) 40 MG tablet, Take 1 tablet (40 mg total) by mouth daily.,  Disp: 90 tablet, Rfl: 0   Semaglutide (RYBELSUS) 14 MG TABS, Take 1 tablet (14 mg total) by mouth daily., Disp: 90 tablet, Rfl: 0   solifenacin (VESICARE) 5 MG tablet, Take 1 tablet (5 mg total) by mouth daily., Disp: 90 tablet, Rfl: 0   Spacer/Aero-Holding Chambers DEVI, USE WITH ALBUTEROL INHALER., Disp: 1 each, Rfl: 0   topiramate (TOPAMAX) 50 MG tablet, Take 1 tablet (50 mg total) by mouth daily., Disp: 90 tablet, Rfl: 0   triamcinolone cream (KENALOG) 0.1 %, APPLY 1 APPLICATION TOPICALLY TWICE DAILY, Disp: 80 g, Rfl: 10   vortioxetine HBr (TRINTELLIX) 20 MG TABS tablet, Take 1 tablet (20 mg total) by mouth daily., Disp: 90 tablet, Rfl: 0  Vitals   Vitals:   07/12/2023 2038 07-12-2023 2045 07/01/23 0051 07/01/23 0405  BP: 101/80     Pulse: (!) 117     Resp: 19   (!) 27  Temp: 98.2 F (36.8 C)  98.2 F (36.8 C)   TempSrc: Oral  Oral   SpO2: 95%     Weight:  70.8 kg    Height:  5\' 3"  (1.6 m)      Body mass index is 27.63 kg/m.  Physical Exam  General: Elderly woman with BiPAP comfortably sleeping HEENT: Normocephalic atraumatic Lungs: Clear Cardiovascular: Regular treatment Neurological exam Comfortable sleeping with BiPAP Somewhat startled to wake up when I woke her up Difficult to communicate due to BiPAP Was able to follow all simple commands Was able to verbalize her name Cranial nerves II to XII intact Motor examination with no drift in the upper extremities.  Both lower extremities equally drifting to bed-somewhat effort dependent. Sensation intact Coordination difficult to assess  Labs/Imaging/Neurodiagnostic studies   CBC:  Recent Labs  Lab 07-12-23 2100  WBC 9.3  HGB 13.2  HCT 41.4  MCV 92.8  PLT 266   Basic Metabolic Panel:  Lab Results  Component Value Date   NA 139 July 12, 2023   K 3.3 (L) 12-Jul-2023   CO2 25 07-12-23   GLUCOSE 197 (H) 07-12-23   BUN 15 2023-07-12   CREATININE 1.17 (H) 2023-07-12   CALCIUM 8.9 12-Jul-2023  GFRNONAA 50 (L)  06/30/2023   GFRAA 77 03/24/2020   Lipid Panel:  Lab Results  Component Value Date   LDLCALC 59 03/08/2023   HgbA1c:  Lab Results  Component Value Date   HGBA1C 5.9 (H) 03/08/2023   CT Head without contrast(Personally reviewed): No acute changes  ASSESSMENT   Hailey Greene is a 73 y.o. female past history of diabetes, hypertension, depression, RLS presenting for evaluation of multiple episodes over the past 72 hours of lipsmacking followed by unresponsiveness and generalized whole body tonic-clonic activity concerning for seizures. No prior history of seizures On my exam, was on BiPAP and somewhat mildly encephalopathic. No focality seen on my exam Concern for new onset seizures versus syncopal episode   RECOMMENDATIONS  Admit to hospitalist at [facility] I would recommend hooking her up to LTM EEG for spell capture. Continue telemetry She received 1 dose of Ativan and Keppra load-I would hold off on standing doses of Keppra for now Maintain seizure precautions MRI of the brain with and without contrast when able to I would recommend checking urinalysis, and chest x-ray to rule out any underlying infection lowering seizure threshold Syncope workup per primary team. Neurohospitalist service will follow with you. Primary plan discussed with Dr. Doran Durand ______________________________________________________________________    Signed, Milon Dikes, MD [facility]

## 2023-07-01 NOTE — Progress Notes (Signed)
   07/01/23 0116  BiPAP/CPAP/SIPAP  BiPAP/CPAP/SIPAP Pt Type Adult (On hold for decreased LOC. MD aware)

## 2023-07-01 NOTE — ED Notes (Signed)
[facility] transport setup for pt

## 2023-07-01 NOTE — Progress Notes (Signed)
   07/01/23 0146  BiPAP/CPAP/SIPAP  $ Non-Invasive Ventilator  Non-Invasive Vent Initial  $ Face Mask Medium Yes  BiPAP/CPAP/SIPAP Pt Type Adult  BiPAP/CPAP/SIPAP SERVO  Mask Type Full face mask  Dentures removed? Not applicable  Mask Size Medium  IPAP 10 cmH20  EPAP 5 cmH2O  Pressure Support 5 cmH20 (above PEEP 5)  PEEP 5 cmH20  FiO2 (%) 30 %  Minute Ventilation 10.9  Peak Inspiratory Pressure (PIP) 10  Tidal Volume (Vt) 778  Patient Home Machine No  Patient Home Mask No  Patient Home Tubing No  Auto Titrate No  Press High Alarm 25 cmH2O  Device Plugged into RED Power Outlet Yes  BiPAP/CPAP /SiPAP Vitals  Bilateral Breath Sounds Clear;Diminished

## 2023-07-02 ENCOUNTER — Inpatient Hospital Stay (HOSPITAL_COMMUNITY)

## 2023-07-02 DIAGNOSIS — R569 Unspecified convulsions: Secondary | ICD-10-CM | POA: Diagnosis not present

## 2023-07-02 DIAGNOSIS — R55 Syncope and collapse: Secondary | ICD-10-CM | POA: Diagnosis not present

## 2023-07-02 LAB — COMPREHENSIVE METABOLIC PANEL WITH GFR
ALT: 11 U/L (ref 0–44)
AST: 15 U/L (ref 15–41)
Albumin: 3 g/dL — ABNORMAL LOW (ref 3.5–5.0)
Alkaline Phosphatase: 48 U/L (ref 38–126)
Anion gap: 10 (ref 5–15)
BUN: 8 mg/dL (ref 8–23)
CO2: 24 mmol/L (ref 22–32)
Calcium: 8.9 mg/dL (ref 8.9–10.3)
Chloride: 105 mmol/L (ref 98–111)
Creatinine, Ser: 0.79 mg/dL (ref 0.44–1.00)
GFR, Estimated: >60 mL/min (ref >60)
Glucose, Bld: 98 mg/dL (ref 70–99)
Potassium: 4.4 mmol/L (ref 3.5–5.1)
Sodium: 139 mmol/L (ref 135–145)
Total Bilirubin: 0.5 mg/dL (ref 0.0–1.2)
Total Protein: 5.7 g/dL — ABNORMAL LOW (ref 6.5–8.1)

## 2023-07-02 LAB — CBC WITH DIFFERENTIAL/PLATELET
Abs Immature Granulocytes: 0.03 10*3/uL (ref 0.00–0.07)
Basophils Absolute: 0 10*3/uL (ref 0.0–0.1)
Basophils Relative: 0 %
Eosinophils Absolute: 0.1 10*3/uL (ref 0.0–0.5)
Eosinophils Relative: 2 %
HCT: 39 % (ref 36.0–46.0)
Hemoglobin: 12.6 g/dL (ref 12.0–15.0)
Immature Granulocytes: 0 %
Lymphocytes Relative: 16 %
Lymphs Abs: 1.2 10*3/uL (ref 0.7–4.0)
MCH: 29.8 pg (ref 26.0–34.0)
MCHC: 32.3 g/dL (ref 30.0–36.0)
MCV: 92.2 fL (ref 80.0–100.0)
Monocytes Absolute: 0.4 10*3/uL (ref 0.1–1.0)
Monocytes Relative: 5 %
Neutro Abs: 5.8 10*3/uL (ref 1.7–7.7)
Neutrophils Relative %: 77 %
Platelets: 229 10*3/uL (ref 150–400)
RBC: 4.23 MIL/uL (ref 3.87–5.11)
RDW: 14.5 % (ref 11.5–15.5)
WBC: 7.7 10*3/uL (ref 4.0–10.5)
nRBC: 0 % (ref 0.0–0.2)

## 2023-07-02 LAB — TSH: TSH: 0.694 u[IU]/mL (ref 0.350–4.500)

## 2023-07-02 LAB — BRAIN NATRIURETIC PEPTIDE: B Natriuretic Peptide: 89.6 pg/mL (ref 0.0–100.0)

## 2023-07-02 LAB — C-REACTIVE PROTEIN: CRP: 0.5 mg/dL (ref <1.0)

## 2023-07-02 LAB — GLUCOSE, CAPILLARY: Glucose-Capillary: 108 mg/dL — ABNORMAL HIGH (ref 70–99)

## 2023-07-02 LAB — T4, FREE: Free T4: 0.76 ng/dL (ref 0.61–1.12)

## 2023-07-02 LAB — AMMONIA: Ammonia: 30 umol/L (ref 9–35)

## 2023-07-02 LAB — MAGNESIUM: Magnesium: 1.9 mg/dL (ref 1.7–2.4)

## 2023-07-02 LAB — PHOSPHORUS: Phosphorus: 3.2 mg/dL (ref 2.5–4.6)

## 2023-07-02 NOTE — Procedures (Addendum)
 Patient Name: Hailey Greene  MRN: [id number]  Epilepsy Attending: Charlsie Quest  Referring Physician/Provider: Erick Blinks, MD  Duration: 07/01/2023 0853 to 07/02/2023 1004   Patient history: 73yo f with seizure like activity. EEG to evaluate for seizure   Level of alertness: Awake, asleep   AEDs during EEG study: LEV, PGB   Technical aspects: This EEG study was done with scalp electrodes positioned according to the 10-20 International system of electrode placement. Electrical activity was reviewed with band pass filter of 1-70Hz , sensitivity of 7 uV/mm, display speed of 29mm/sec with a 60Hz  notched filter applied as appropriate. EEG data were recorded continuously and digitally stored.  Video monitoring was available and reviewed as appropriate.   Description: The posterior dominant rhythm consists of 9 Hz activity of moderate voltage (25-35 uV) seen predominantly in posterior head regions, symmetric and reactive to eye opening and eye closing. Photic driving ws not seen during photic stimulation. Hyperventilation was not performed.     EEG was disconnected between 07/01/2023 1546 to 1722 for MRI brain.   IMPRESSION: This study is within normal limits. No seizures or epileptiform discharges were seen throughout the recording.   A normal interictal EEG does not exclude the diagnosis of epilepsy.   Hailey Greene

## 2023-07-02 NOTE — Progress Notes (Signed)
 PROGRESS NOTE        PATIENT DETAILS Name: Hailey Greene Age: 73 y.o. Sex: female Date of Birth: 09/26/1950 Admit Date: 06/30/2023 Admitting Physician Gery Pray, MD PCP:Cox, Fritzi Mandes, MD  Brief Summary: Patient is a 73 y.o.  female with history of DM-2, HTN, depression, RLS-who presented with multiple falls/slurred speech/possible seizure-like activity.  Significant events: 4/4>> admit to [facility]  Significant studies: 4/4>> CT head: No acute abnormalities.  Significant microbiology data: 4/5>> COVID/influenza/RSV PCR: Negative 4/5>> blood cultures: Pending  Procedures: None  Consults: Neurology  Subjective: Patient in bed, appears comfortable, denies any headache, no fever, no chest pain or pressure, no shortness of breath , no abdominal pain. No focal weakness.  Objective: Vitals: Blood pressure (!) 155/76, pulse 86, temperature 98.2 F (36.8 C), temperature source Oral, resp. rate 20, height 5\' 3"  (1.6 m), weight 70.8 kg, SpO2 91%.   Exam:  Awake Alert, No new F.N deficits, Normal affect .AT,PERRAL Supple Neck, No JVD,   Symmetrical Chest wall movement, Good air movement bilaterally, CTAB RRR,No Gallops, Rubs or new Murmurs,  +ve B.Sounds, Abd Soft, No tenderness,   No Cyanosis, Clubbing or edema    Assessment/Plan: Possible seizure like episodes-some slurred speech Received 1 dose of IV Ativan and Keppra load in the ED-currently being monitored off all AEDs Thus far negative CT head, MRI brain nonacute, EEG thus far negative Continue long-term EEG, EEG thus far unremarkable along with MRI, case discussed with neurology monitor another 24 hours on EEG.  Hypokalemia Repleted-awaiting repeat labs this morning.  DM-2 (A1c 5.9 on 05/08/2022) CBGs stable SSI  HTN BP stable All antihypertensives remain on hold-resume oral neck several days depending on how her blood pressure does.  HLD Resume statin  Peripheral  neuropathy Resume Lyrica  RLS Requip  Mood disorder Holding Topamax/Trintellix for now-resume next several days based on EEG/neurostatus.  GERD PPI  BMI: Estimated body mass index is 27.63 kg/m as calculated from the following:   Height as of this encounter: 5\' 3"  (1.6 m).   Weight as of this encounter: 70.8 kg.   Code status:   Code Status: Full Code   DVT Prophylaxis: enoxaparin (LOVENOX) injection 40 mg Start: 07/01/23 1000   Family Communication: Called husband Onalee Hua [phone] on 07/02/2023 at 8:45 AM and a detailed message left.   Disposition Plan: Status is: Inpatient Remains inpatient appropriate because: Severity of illness   Planned Discharge Destination:Home   Diet: Diet Order             Diet heart healthy/carb modified Room service appropriate? Yes; Fluid consistency: Thin  Diet effective now                     Antimicrobial agents: Anti-infectives (From admission, onward)    None        MEDICATIONS: Scheduled Meds:  enoxaparin (LOVENOX) injection  40 mg Subcutaneous Q24H   insulin aspart  0-15 Units Subcutaneous TID WC   insulin aspart  0-5 Units Subcutaneous QHS   pantoprazole  40 mg Oral Daily   pregabalin  300 mg Oral Daily   rosuvastatin  40 mg Oral Daily   Continuous Infusions:   PRN Meds:.acetaminophen **OR** acetaminophen, LORazepam, senna-docusate   I have personally reviewed following labs and imaging studies  LABORATORY DATA: CBC: Recent Labs  Lab 06/30/23 2100 07/01/23 1037 07/02/23 0631  WBC 9.3 8.2 7.7  NEUTROABS  --  5.5 5.8  HGB 13.2 12.0 12.6  HCT 41.4 37.0 39.0  MCV 92.8 93.0 92.2  PLT 266 246 229    Basic Metabolic Panel: Recent Labs  Lab 06/30/23 2100 07/01/23 1037  NA 139 139  K 3.3* 4.3  CL 102 106  CO2 25 25  GLUCOSE 197* 50*  BUN 15 11  CREATININE 1.17* 0.83  CALCIUM 8.9 8.7*    GFR: Estimated Creatinine Clearance: 57.8 mL/min (by C-G formula based on SCr of 0.83  mg/dL).  Liver Function Tests: No results for input(s): "AST", "ALT", "ALKPHOS", "BILITOT", "PROT", "ALBUMIN" in the last 168 hours. No results for input(s): "LIPASE", "AMYLASE" in the last 168 hours. Recent Labs  Lab 07/02/23 0631  AMMONIA 30    Coagulation Profile: No results for input(s): "INR", "PROTIME" in the last 168 hours.  Cardiac Enzymes: Recent Labs  Lab 07/01/23 1037  CKTOTAL 40    BNP (last 3 results) No results for input(s): "PROBNP" in the last 8760 hours.  Lipid Profile: No results for input(s): "CHOL", "HDL", "LDLCALC", "TRIG", "CHOLHDL", "LDLDIRECT" in the last 72 hours.  Thyroid Function Tests: No results for input(s): "TSH", "T4TOTAL", "FREET4", "T3FREE", "THYROIDAB" in the last 72 hours.  Anemia Panel: No results for input(s): "VITAMINB12", "FOLATE", "FERRITIN", "TIBC", "IRON", "RETICCTPCT" in the last 72 hours.  Urine analysis:    Component Value Date/Time   COLORURINE YELLOW 06/16/2023 1938   APPEARANCEUR CLEAR 06/16/2023 1938   LABSPEC 1.021 06/16/2023 1938   PHURINE 5.0 06/16/2023 1938   GLUCOSEU NEGATIVE 06/16/2023 1938   HGBUR NEGATIVE 06/16/2023 1938   BILIRUBINUR NEGATIVE 06/16/2023 1938   BILIRUBINUR negative 09/25/2020 1041   BILIRUBINUR Negative 08/13/2019 1027   KETONESUR NEGATIVE 06/16/2023 1938   PROTEINUR NEGATIVE 06/16/2023 1938   UROBILINOGEN 0.2 09/25/2020 1041   NITRITE NEGATIVE 06/16/2023 1938   LEUKOCYTESUR NEGATIVE 06/16/2023 1938    Sepsis Labs: Lactic Acid, Venous    Component Value Date/Time   LATICACIDVEN 0.8 12/31/2018 1609    MICROBIOLOGY: Recent Results (from the past 240 hours)  Resp panel by RT-PCR (RSV, Flu A&B, Covid) Anterior Nasal Swab     Status: None   Collection Time: 07/01/23 12:39 AM   Specimen: Anterior Nasal Swab  Result Value Ref Range Status   SARS Coronavirus 2 by RT PCR NEGATIVE NEGATIVE Final    Comment: (NOTE) SARS-CoV-2 target nucleic acids are NOT DETECTED.  The SARS-CoV-2 RNA  is generally detectable in upper respiratory specimens during the acute phase of infection. The lowest concentration of SARS-CoV-2 viral copies this assay can detect is 138 copies/mL. A negative result does not preclude SARS-Cov-2 infection and should not be used as the sole basis for treatment or other patient management decisions. A negative result may occur with  improper specimen collection/handling, submission of specimen other than nasopharyngeal swab, presence of viral mutation(s) within the areas targeted by this assay, and inadequate number of viral copies(<138 copies/mL). A negative result must be combined with clinical observations, patient history, and epidemiological information. The expected result is Negative.  Fact Sheet for Patients:  BloggerCourse.com  Fact Sheet for Healthcare Providers:  SeriousBroker.it  This test is no t yet approved or cleared by the [place] FDA and  has been authorized for detection and/or diagnosis of SARS-CoV-2 by FDA under an Emergency Use Authorization (EUA). This EUA will remain  in effect (meaning this test can be used) for the duration of the COVID-19 declaration under Section 564(b)(1)  of the Act, 21 U.S.C.section 360bbb-3(b)(1), unless the authorization is terminated  or revoked sooner.       Influenza A by PCR NEGATIVE NEGATIVE Final   Influenza B by PCR NEGATIVE NEGATIVE Final    Comment: (NOTE) The Xpert Xpress SARS-CoV-2/FLU/RSV plus assay is intended as an aid in the diagnosis of influenza from Nasopharyngeal swab specimens and should not be used as a sole basis for treatment. Nasal washings and aspirates are unacceptable for Xpert Xpress SARS-CoV-2/FLU/RSV testing.  Fact Sheet for Patients: BloggerCourse.com  Fact Sheet for Healthcare Providers: SeriousBroker.it  This test is not yet approved or cleared by the  [place] FDA and has been authorized for detection and/or diagnosis of SARS-CoV-2 by FDA under an Emergency Use Authorization (EUA). This EUA will remain in effect (meaning this test can be used) for the duration of the COVID-19 declaration under Section 564(b)(1) of the Act, 21 U.S.C. section 360bbb-3(b)(1), unless the authorization is terminated or revoked.     Resp Syncytial Virus by PCR NEGATIVE NEGATIVE Final    Comment: (NOTE) Fact Sheet for Patients: BloggerCourse.com  Fact Sheet for Healthcare Providers: SeriousBroker.it  This test is not yet approved or cleared by the [place] FDA and has been authorized for detection and/or diagnosis of SARS-CoV-2 by FDA under an Emergency Use Authorization (EUA). This EUA will remain in effect (meaning this test can be used) for the duration of the COVID-19 declaration under Section 564(b)(1) of the Act, 21 U.S.C. section 360bbb-3(b)(1), unless the authorization is terminated or revoked.  Performed at [facility], [address], [place], [place] [address]   Culture, blood (Routine X 2) w Reflex to ID Panel     Status: None (Preliminary result)   Collection Time: 07/01/23  2:50 AM   Specimen: BLOOD  Result Value Ref Range Status   Specimen Description   Final    BLOOD RIGHT ANTECUBITAL Performed at [facility], [address] [place], [place] [address]    Special Requests   Final    BOTTLES DRAWN AEROBIC AND ANAEROBIC Blood Culture results may not be optimal due to an inadequate volume of blood received in culture bottles Performed at [facility], [address] [place], [place] [address]    Culture   Final    NO GROWTH 1 DAY Performed at [facility], [address] [place], [place] [address]    Report Status PENDING  Incomplete  Culture, blood (Routine X 2) w Reflex to ID Panel     Status: None  (Preliminary result)   Collection Time: 07/01/23  2:52 AM   Specimen: BLOOD  Result Value Ref Range Status   Specimen Description   Final    BLOOD LEFT ANTECUBITAL Performed at [facility], [address], [place], [place] [address]    Special Requests   Final    BOTTLES DRAWN AEROBIC AND ANAEROBIC Blood Culture results may not be optimal due to an inadequate volume of blood received in culture bottles Performed at [facility], [address] [place], [place] [address]    Culture   Final    NO GROWTH 1 DAY Performed at [facility], [address] [place], [place] [address]    Report Status PENDING  Incomplete    RADIOLOGY STUDIES/RESULTS: Overnight EEG with video Result Date: 07/02/2023 Charlsie Quest, MD     07/02/2023  7:06 AM Patient Name: DARITZA BREES MRN: [id number]  Epilepsy Attending: Charlsie Quest Referring Physician/Provider: Erick Blinks, MD Duration: 07/01/2023 3244 to 07/02/2023 0700  Patient history: 73yo f with seizure like activity. EEG to evaluate for seizure  Level of alertness: Awake, asleep  AEDs during EEG study: LEV, PGB  Technical aspects: This EEG study was done with scalp electrodes positioned according to the 10-20 International system of electrode placement. Electrical activity was reviewed with band pass filter of 1-70Hz , sensitivity of 7 uV/mm, display speed of 70mm/sec with a 60Hz  notched filter applied as appropriate. EEG data were recorded continuously and digitally stored.  Video monitoring was available and reviewed as appropriate.  Description: The posterior dominant rhythm consists of 9 Hz activity of moderate voltage (25-35 uV) seen predominantly in posterior head regions, symmetric and reactive to eye opening and eye closing. Photic driving ws not seen during photic stimulation. Hyperventilation was not performed.   EEG was disconnected between 07/01/2023 1546 to 1722 for MRI brain.  IMPRESSION: This  study is within normal limits. No seizures or epileptiform discharges were seen throughout the recording.  A normal interictal EEG does not exclude the diagnosis of epilepsy.  Charlsie Quest   MR BRAIN W WO CONTRAST Result Date: 07/01/2023 CLINICAL DATA:  Seizures.  Slurred speech. EXAM: MRI HEAD WITHOUT AND WITH CONTRAST TECHNIQUE: Multiplanar, multiecho pulse sequences of the brain and surrounding structures were obtained without and with intravenous contrast. CONTRAST:  7mL GADAVIST GADOBUTROL 1 MMOL/ML IV SOLN COMPARISON:  MR head without contrast 09/02/2021. FINDINGS: Brain: No acute infarct, hemorrhage, or mass lesion is present. No significant white matter lesions are present. Deep brain nuclei are within normal limits. The ventricles are of normal size. Central pontine T2 hyperintensity is noted. No associated enhancement is present. The brainstem and cerebellum are otherwise within normal limits. The internal auditory canals are within normal limits. Midline structures are within normal limits. Dedicated imaging of the temporal lobes demonstrates symmetric size and signal of the hippocampal structures bilaterally. Postcontrast images demonstrate no pathologic enhancement. Vascular: Flow is present in the major intracranial arteries. Skull and upper cervical spine: The craniocervical junction is normal. Upper cervical spine is within normal limits. Marrow signal is unremarkable. Sinuses/Orbits: A polyp or mucous retention cyst is present posteriorly within the left maxillary sinus. Paranasal sinuses are otherwise clear. Small mastoid effusions are present bilaterally. No obstructing nasopharyngeal lesion is present. Bilateral lens replacements are noted. Globes and orbits are otherwise unremarkable. IMPRESSION: 1. No acute intracranial abnormality or significant interval change. 2. Central pontine T2 hyperintensity without associated enhancement. This may be related to chronic microvascular ischemia.  Benign venous lesion is also considered. No other significant white matter disease is present. 3. Small mastoid effusions bilaterally. No obstructing nasopharyngeal lesion is present. 4. Polyp or mucous retention cyst of the left maxillary sinus. Electronically Signed   By: Marin Roberts M.D.   On: 07/01/2023 16:55   EEG adult Result Date: 07/01/2023 Charlsie Quest, MD     07/01/2023 10:08 AM Patient Name: KEWANA SANON MRN: [id number] Epilepsy Attending: Charlsie Quest Referring Physician/Provider: Gery Pray, MD Date: 07/01/2023 Duration: 22.14 mins Patient history: 73yo f with seizure like activity. EEG to evaluate for seizure Level of alertness: Awake AEDs during EEG study: LEV, PGB Technical aspects: This EEG study was done with scalp electrodes positioned according to the 10-20 International system of electrode placement. Electrical activity was reviewed with band pass filter of 1-70Hz , sensitivity of 7 uV/mm, display speed of 98mm/sec with a 60Hz  notched filter applied  as appropriate. EEG data were recorded continuously and digitally stored.  Video monitoring was available and reviewed as appropriate. Description: The posterior dominant rhythm consists of 9 Hz activity of moderate voltage (25-35 uV) seen predominantly in posterior head regions, symmetric and reactive to eye opening and eye closing. Photic driving ws not seen during photic stimulation. Hyperventilation was not performed.   IMPRESSION: This study is within normal limits. No seizures or epileptiform discharges were seen throughout the recording. A normal interictal EEG does not exclude the diagnosis of epilepsy. Priyanka Annabelle Harman   CT HEAD WO CONTRAST ( ) Result Date: 06/30/2023 CLINICAL DATA:  History of recent falls, initial encounter EXAM: CT HEAD WITHOUT CONTRAST TECHNIQUE: Contiguous axial images were obtained from the base of the skull through the vertex without intravenous contrast. RADIATION DOSE REDUCTION: This exam  was performed according to the departmental dose-optimization program which includes automated exposure control, adjustment of the mA and/or kV according to patient size and/or use of iterative reconstruction technique. COMPARISON:  02/19/2022 FINDINGS: Brain: No evidence of acute infarction, hemorrhage, hydrocephalus, extra-axial collection or mass lesion/mass effect. Mild atrophic changes are noted. Vascular: No hyperdense vessel or unexpected calcification. Skull: Stable mucosal retention cyst in the left maxillary antrum. Sinuses/Orbits: No acute finding. Other: None. IMPRESSION: Mild atrophic changes without acute abnormality. Electronically Signed   By: Alcide Clever M.D.   On: 06/30/2023 22:32     LOS: 2 days   Signature  -    Susa Raring M.D on 07/02/2023 at 8:44 AM   -  To page go to [email or web address]

## 2023-07-02 NOTE — Plan of Care (Signed)
  Problem: Clinical Measurements: Goal: Ability to maintain clinical measurements within normal limits will improve Outcome: Progressing Goal: Will remain free from infection Outcome: Progressing   Problem: Activity: Goal: Risk for activity intolerance will decrease Outcome: Progressing   Problem: Safety: Goal: Ability to remain free from injury will improve Outcome: Progressing   

## 2023-07-02 NOTE — Progress Notes (Signed)
 LTM maint complete - no skin breakdown seen. [facility] monitored, Event button test confirmed by [facility].

## 2023-07-02 NOTE — Plan of Care (Signed)

## 2023-07-02 NOTE — TOC Transition Note (Signed)
 Transition of Care [facility]) - Discharge Note   Patient Details  Name: Hailey Greene MRN: [id number] Date of Birth: [birth year]  [facility] of [facility] CM/SW Contact:  Lawerance Sabal, RN Phone Number: 07/02/2023, 10:46 AM   Clinical Narrative:     Sherron Monday w patient over the phone. She asked her spouse and he states that they have a RW at home she can use, she declined [facility] services. I notified the nurse she wished to be discharged as soon as possible.     Barriers to Discharge: No Barriers Identified   Patient Goals and CMS Choice Patient states their goals for this hospitalization and ongoing recovery are:: to go home          Discharge Placement                       Discharge Plan and Services Additional resources added to the After Visit Summary for     Discharge Planning Services: CM Consult                      HH Arranged: Refused HH          Social Drivers of Health (SDOH) Interventions SDOH Screenings   Food Insecurity: No Food Insecurity (07/01/2023)  Housing: Low Risk  (07/01/2023)  Transportation Needs: No Transportation Needs (07/01/2023)  Utilities: Not At Risk (07/01/2023)  Alcohol Screen: Low Risk  (03/08/2023)  Depression (PHQ2-9): Low Risk  (04/27/2023)  Financial Resource Strain: Low Risk  (03/08/2023)  Physical Activity: Insufficiently Active (03/08/2023)  Social Connections: Moderately Integrated (03/08/2023)  Stress: No Stress Concern Present (03/08/2023)  Tobacco Use: Medium Risk (06/30/2023)  Health Literacy: Adequate Health Literacy (03/08/2023)     Readmission Risk Interventions     No data to display

## 2023-07-02 NOTE — Progress Notes (Signed)
 NEUROLOGY CONSULT FOLLOW UP NOTE   Date of service: July 02, 2023 Patient Name: Hailey Greene MRN:  [id number] DOB:  Jan 27, 1951  Interval Hx/subjective   No family at bedside. Patient sitting up in bed.  Denies any overnight spells, or acute events.  Wants to go home today.  Vitals   Vitals:   07/01/23 1957 07/01/23 2326 07/02/23 0326 07/02/23 0759  BP: (!) 114/95 (!) 117/59 132/70 (!) 155/76  Pulse: 77 72 72 86  Resp: 17 14 17 20   Temp: 97.6 F (36.4 C) 97.6 F (36.4 C) 98.3 F (36.8 C) 98.2 F (36.8 C)  TempSrc: Oral Axillary Oral Oral  SpO2: 96% 93% 92% 91%  Weight:      Height:         Body mass index is 27.63 kg/m.  Physical Exam   Constitutional: Appears well-developed and well-nourished.  Psych: Affect appropriate to situation.  Calm and cooperative Cardiovascular: Normal rate and regular rhythm.  Respiratory: Effort normal, non-labored breathing.  GI: Soft.  No distension. There is no tenderness.  Skin: WDI.   Neurologic Examination   Neuro: Mental Status: Patient is awake, alert, oriented to person, place, month, year, and situation. Patient is able to give a clear and coherent history. No signs of dysarthria, aphasia or neglect Cranial Nerves: II: Visual Fields are full. Pupils are equal, round, and reactive to light.   III,IV, VI: EOMI without ptosis or diploplia.  V: Facial sensation is symmetric to temperature VII: Facial movement is symmetric.  VIII: hearing is intact to voice X: Uvula elevates symmetrically XI: Shoulder shrug is symmetric. XII: tongue is midline without atrophy or fasciculations.  Motor: Tone is normal. Bulk is normal. 5/5 strength was present in all four extremities.  No drift present.  Moves all extremities spontaneously and to command. Sensory: Sensation is symmetric to light touch in the arms and legs. Cerebellar: FNF and HKS are intact bilaterally  Medications  Current Facility-Administered Medications:     acetaminophen (TYLENOL) tablet 650 mg, 650 mg, Oral, Q6H PRN **OR** acetaminophen (TYLENOL) suppository 650 mg, 650 mg, Rectal, Q6H PRN, Crosley, Debby, MD   enoxaparin (LOVENOX) injection 40 mg, 40 mg, Subcutaneous, Q24H, Crosley, Debby, MD, 40 mg at 07/02/23 0935   insulin aspart (novoLOG) injection 0-15 Units, 0-15 Units, Subcutaneous, TID WC, Crosley, Debby, MD, 2 Units at 07/01/23 0910   insulin aspart (novoLOG) injection 0-5 Units, 0-5 Units, Subcutaneous, QHS, Crosley, Debby, MD   LORazepam (ATIVAN) injection 1 mg, 1 mg, Intravenous, Once PRN, Crosley, Debby, MD   pantoprazole (PROTONIX) EC tablet 40 mg, 40 mg, Oral, Daily, Ghimire, Shanker M, MD, 40 mg at 07/02/23 0934   pregabalin (LYRICA) capsule 300 mg, 300 mg, Oral, Daily, Ghimire, Shanker M, MD, 300 mg at 07/02/23 0934   rosuvastatin (CRESTOR) tablet 40 mg, 40 mg, Oral, Daily, Ghimire, Shanker M, MD, 40 mg at 07/02/23 0934   senna-docusate (Senokot-S) tablet 1 tablet, 1 tablet, Oral, QHS PRN, Gery Pray, MD  Labs and Diagnostic Imaging   CBC:  Recent Labs  Lab 07/01/23 1037 07/02/23 0631  WBC 8.2 7.7  NEUTROABS 5.5 5.8  HGB 12.0 12.6  HCT 37.0 39.0  MCV 93.0 92.2  PLT 246 229    Basic Metabolic Panel:  Lab Results  Component Value Date   NA 139 07/02/2023   K 4.4 07/02/2023   CO2 24 07/02/2023   GLUCOSE 98 07/02/2023   BUN 8 07/02/2023   CREATININE 0.79 07/02/2023   CALCIUM 8.9 07/02/2023  GFRNONAA >60 07/02/2023   GFRAA 77 03/24/2020   Lipid Panel:  Lab Results  Component Value Date   LDLCALC 59 03/08/2023   HgbA1c:  Lab Results  Component Value Date   HGBA1C 5.9 (H) 03/08/2023   CT Head without contrast(Personally reviewed): No acute abnormality, mild atrophic changes  MRI Brain(Personally reviewed): No acute intracranial abnormality or significant interval change Central pontine T2 hyperintensity without associated enhancement, possibly related to chronic microvascular ischemia  LTM EEG  4/5: Study is within normal limits. No seizures or epileptiform discharges were seen throughout the recording.   LTM EEG 4/6: Study is within normal limits. No seizures or epileptiform discharges were seen throughout the recording   Assessment   Hailey Greene is a 73 y.o. female with PMH significant for DM2, HTN, Depression, RLS who presented 4/4 d/t multiple seizure-like episodes over the past 72 hours, including lip-smacking and unresponsiveness, generalized tonic-clonic activity. She states that she doesn't have these episodes very often. She does not have a diagnosis of epilepsy and is not on any maintenance AEDs.   She was placed on LTM EEG due to concern for new onset seizures. Over 48 hours, LTM EEG has been negative for any seizures and no spells have been captured.   Patient has been instructed on follow-up care.  Seizure precautions are not needed, spells likely syncopal episode as no seizure activity has been captured during her hospital visit.  No evidence for seizure precautions or AED medication.  Recommendations  - Discontinue LTM EEG - No AEDs or seizure precautions needed at this time - Outpatient neurology follow-up (ordered)  Patient is OK for discharge from neurology standpoint, with recommendations as above. Follow-up with outpatient neurology in 8 weeks.   ______________________________________________________________________   Pt seen by Neuro NP/APP and later by MD. Note/plan to be edited by MD as needed.    Lynnae January, DNP, AGACNP-BC [facility] Please use AMION for contact information & EPIC for messaging.  NEUROHOSPITALIST ADDENDUM Performed a face to face diagnostic evaluation.   I have reviewed the contents of history and physical exam as documented by PA/ARNP/Resident and agree with above documentation.  I have discussed and formulated the above plan as documented. Edits to the note have been made as needed.  Impression/Key exam  findings/Plan: I offered to keep her here for another day on LTM or get her out if she feels strongly about discharge. She wants to go home. Unable to captuer her spell over the last day. Will get her out with outpatient follow up with neurology. No AEDs at this time.  Erick Blinks, MD [facility] [phone]   If 7pm to 7am, please call on call as listed on AMION.

## 2023-07-02 NOTE — Progress Notes (Signed)
 LTM EEG disconnected - no skin breakdown at [facility].

## 2023-07-02 NOTE — Discharge Summary (Signed)
 Hailey Greene ZOX:[id number] DOB: Sep 18, 1950 DOA: 06/30/2023  PCP: Blane Ohara, MD  Admit date: 06/30/2023  Discharge date: 07/02/2023  Admitted From: Home   Disposition:  Home   Recommendations for Outpatient Follow-up:   Follow up with PCP in 1-2 weeks  PCP Please obtain BMP/CBC, 2 view CXR in 1week,  (see Discharge instructions)   PCP Please follow up on the following pending results:    Home Health: PT   Equipment/Devices: walker  Consultations: Neuro Discharge Condition: Stable    CODE STATUS: Full    Diet Recommendation: Heart Healthy Low Carb    Chief Complaint  Patient presents with   Fall   Seizures     Brief history of present illness from the day of admission and additional interim summary    73 y.o.  female with history of DM-2, HTN, depression, RLS-who presented with multiple falls/slurred speech/possible seizure-like activity.   Significant events: 4/4>> admit to [facility]   Significant studies: 4/4>> CT head: No acute abnormalities.   Significant microbiology data: 4/5>> COVID/influenza/RSV PCR: Negative                                                                 Hospital Course   Possible seizure like episodes-some slurred speech Received 1 dose of IV Ativan and Keppra load in the ED-currently being monitored off all AEDs Thus far negative CT head, MRI brain nonacute, EEG thus far negative Seen by neurology team this morning, case discussed with neurology on 07/02/2023 discharge home with home PT and walker.  No AEDs. Could have been transient hypotension causing her symptoms, no change in home medications per neurology.  Discharged with outpatient PCP and neurology follow-up.  Patient back to baseline symptom-free and eager to go home as soon as possible, does not want to work with  PT OT or speech at the hospital wants to see PT at home.   DM-2 (A1c 5.9 on 05/08/2022) CBGs stable New home regimen   HTN BP stable Continue home regimen.   HLD Resume statin   Peripheral neuropathy Resume Lyrica   RLS Requip   Mood disorder Holding Topamax/Trintellix for now-resume next several days based on EEG/neurostatus.   GERD PPI    Discharge diagnosis     Principal Problem:   Seizure (HCC) Active Problems:   Idiopathic progressive neuropathy   T2DM (type 2 diabetes mellitus) (HCC)   Essential hypertension, benign   Severe obesity with body mass index (BMI) of 36.0 to 36.9 with serious comorbidity (HCC)   Hypokalemia   Mixed hyperlipidemia   RLS (restless legs syndrome)   Situational anxiety   Falls   AKI (acute kidney injury) [facility])    Discharge instructions    Discharge Instructions     Ambulatory referral to Neurology   Complete by:  As directed    An appointment is requested in approximately: 8 weeks   Discharge instructions   Complete by: As directed    Do not drive, operate heavy machinery, perform activities at heights, swimming or participation in water activities or provide baby sitting services until you have seen by Primary MD or a Neurologist and advised to do so again.  Follow with Primary MD Cox, Kirsten, MD in 7 days   Get CBC, CMP, 2 view Chest X ray -  checked next visit with your primary MD    Activity: As tolerated with Full fall precautions use walker/cane & assistance as needed  Disposition Home    Diet: Heart Healthy Low Carb  Special Instructions: If you have smoked or chewed Tobacco  in the last 2 yrs please stop smoking, stop any regular Alcohol  and or any Recreational drug use.  On your next visit with your primary care physician please Get Medicines reviewed and adjusted.  Please request your Prim.MD to go over all Hospital Tests and Procedure/Radiological results at the follow up, please get all Hospital records  sent to your Prim MD by signing hospital release before you go home.  If you experience worsening of your admission symptoms, develop shortness of breath, life threatening emergency, suicidal or homicidal thoughts you must seek medical attention immediately by calling 911 or calling your MD immediately  if symptoms less severe.  You Must read complete instructions/literature along with all the possible adverse reactions/side effects for all the Medicines you take and that have been prescribed to you. Take any new Medicines after you have completely understood and accpet all the possible adverse reactions/side effects.   Do not drive when taking Pain medications.  Do not take more than prescribed Pain, Sleep and Anxiety Medications  Wear Seat belts while driving.   Increase activity slowly   Complete by: As directed        Discharge Medications   Allergies as of 07/02/2023   No Known Allergies      Medication List     TAKE these medications    albuterol 108 (90 Base) MCG/ACT inhaler Commonly known as: VENTOLIN HFA Inhale 2 puffs into the lungs every 6 (six) hours as needed for wheezing or shortness of breath.   amitriptyline 25 MG tablet Commonly known as: ELAVIL Take 1 tablet (25 mg total) by mouth every evening.   ASPIRIN 81 PO Take 81 mg by mouth daily.   Breztri Aerosphere 160-9-4.8 MCG/ACT Aero Generic drug: budeson-glycopyrrolate-formoterol Inhale 2 puffs into the lungs in the morning and at bedtime.   dicyclomine 20 MG tablet Commonly known as: BENTYL Take 1 tablet (20 mg total) by mouth 4 (four) times daily as needed (intestinal spasms).   Eszopiclone 3 MG Tabs Take 1 tablet (3 mg total) by mouth at bedtime.   ezetimibe 10 MG tablet Commonly known as: ZETIA Take 1 tablet (10 mg total) by mouth daily.   fluticasone 50 MCG/ACT nasal spray Commonly known as: FLONASE SPRAY 2 SPRAYS INTO EACH NOSTRIL EVERY DAY   hydrOXYzine 25 MG capsule Commonly known as:  VISTARIL Take 1 capsule (25 mg total) by mouth at bedtime as needed.   icosapent Ethyl 1 g capsule Commonly known as: Vascepa Take 2 capsules (2 g total) by mouth 2 (two) times daily.   ipratropium-albuterol 0.5-2.5 (3) MG/3ML Soln Commonly known as: DUONEB Take 3 mLs by nebulization every 6 (six) hours as needed (SOB. WHEEZING).   loperamide 2 MG capsule Commonly  known as: IMODIUM Take by mouth.   loratadine 10 MG tablet Commonly known as: CLARITIN Take 1 tablet (10 mg total) by mouth daily.   metoCLOPramide 10 MG tablet Commonly known as: REGLAN Take 1 tablet (10 mg total) by mouth every 6 (six) hours as needed for nausea.   multivitamin with minerals Tabs tablet Take 1 tablet by mouth daily.   omeprazole 20 MG capsule Commonly known as: PRILOSEC Take 1 capsule (20 mg total) by mouth daily.   ondansetron 4 MG tablet Commonly known as: ZOFRAN TAKE 1 TABLET BY MOUTH EVERY 6 HOURS AS NEEDED FOR NAUSEA AND VOMITING   OneTouch Delica Plus Lancet33G Misc E 11.42 Use new lancet each time when checking FBS   OneTouch Verio test strip Generic drug: glucose blood E11.42 Use new test strip each time when checking FBS   pantoprazole 40 MG tablet Commonly known as: PROTONIX Take 40 mg by mouth daily.   phentermine 37.5 MG tablet Commonly known as: ADIPEX-P Take 1 tablet (37.5 mg total) by mouth every morning.   pregabalin 300 MG capsule Commonly known as: LYRICA Take 1 capsule (300 mg total) by mouth 2 (two) times daily.   promethazine 25 MG tablet Commonly known as: PHENERGAN Take 1 tablet (25 mg total) by mouth every 6 (six) hours as needed for nausea or vomiting.   ramipril 2.5 MG capsule Commonly known as: ALTACE Take 1 capsule (2.5 mg total) by mouth daily.   rOPINIRole 3 MG tablet Commonly known as: REQUIP Take 1 tablet (3 mg total) by mouth daily 1-3 hours before bedtime.   rosuvastatin 40 MG tablet Commonly known as: CRESTOR Take 1 tablet (40 mg total)  by mouth daily.   Rybelsus 14 MG Tabs Generic drug: Semaglutide Take 1 tablet (14 mg total) by mouth daily.   solifenacin 5 MG tablet Commonly known as: VESICARE Take 1 tablet (5 mg total) by mouth daily.   Symbicort 160-4.5 MCG/ACT inhaler Generic drug: budesonide-formoterol Inhale 2 puffs into the lungs 2 (two) times daily.   topiramate 50 MG tablet Commonly known as: TOPAMAX Take 1 tablet (50 mg total) by mouth daily.   triamcinolone cream 0.1 % Commonly known as: KENALOG APPLY 1 APPLICATION TOPICALLY TWICE DAILY   Vitamin B-12 2500 MCG Subl Place 1 tablet (2,500 mcg total) under the tongue daily. DISSOLVE 1 TABLET UNDER THE TONGUE DAILY   vortioxetine HBr 20 MG Tabs tablet Commonly known as: Trintellix Take 1 tablet (20 mg total) by mouth daily.               Durable Medical Equipment  (From admission, onward)           Start     Ordered   07/02/23 1018  For home use only DME Walker rolling  Once       Comments: 5 wheel  Question Answer Comment  Walker: With 5 Inch Wheels   Patient needs a walker to treat with the following condition Weakness      07/02/23 1017             Follow-up Information     Cox, Kirsten, MD. Schedule an appointment as soon as possible for a visit in 1 week(s).   Specialty: Family Medicine Contact information: [address] [place] [address] [phone]         [facility]. Schedule an appointment as soon as possible for a visit in 1 week(s).   Contact information: [address]  [address] [place] [address] [phone]                Major procedures and Radiology Reports - PLEASE review detailed and final reports thoroughly  -       Overnight EEG with video Result Date: 07/02/2023 Charlsie Quest, MD     07/02/2023  7:06 AM Patient Name: BECKY COLAN MRN: [id number] Epilepsy Attending: Charlsie Quest Referring Physician/Provider:  Erick Blinks, MD Duration: 07/01/2023 0853 to 07/02/2023 0700  Patient history: 73yo f with seizure like activity. EEG to evaluate for seizure  Level of alertness: Awake, asleep  AEDs during EEG study: LEV, PGB  Technical aspects: This EEG study was done with scalp electrodes positioned according to the 10-20 International system of electrode placement. Electrical activity was reviewed with band pass filter of 1-70Hz , sensitivity of 7 uV/mm, display speed of 30mm/sec with a 60Hz  notched filter applied as appropriate. EEG data were recorded continuously and digitally stored.  Video monitoring was available and reviewed as appropriate.  Description: The posterior dominant rhythm consists of 9 Hz activity of moderate voltage (25-35 uV) seen predominantly in posterior head regions, symmetric and reactive to eye opening and eye closing. Photic driving ws not seen during photic stimulation. Hyperventilation was not performed.   EEG was disconnected between 07/01/2023 1546 to 1722 for MRI brain.  IMPRESSION: This study is within normal limits. No seizures or epileptiform discharges were seen throughout the recording.  A normal interictal EEG does not exclude the diagnosis of epilepsy.  Charlsie Quest   MR BRAIN W WO CONTRAST Result Date: 07/01/2023 CLINICAL DATA:  Seizures.  Slurred speech. EXAM: MRI HEAD WITHOUT AND WITH CONTRAST TECHNIQUE: Multiplanar, multiecho pulse sequences of the brain and surrounding structures were obtained without and with intravenous contrast. CONTRAST:  7mL GADAVIST GADOBUTROL 1 MMOL/ML IV SOLN COMPARISON:  MR head without contrast 09/02/2021. FINDINGS: Brain: No acute infarct, hemorrhage, or mass lesion is present. No significant white matter lesions are present. Deep brain nuclei are within normal limits. The ventricles are of normal size. Central pontine T2 hyperintensity is noted. No associated enhancement is present. The brainstem and cerebellum are otherwise within normal limits. The  internal auditory canals are within normal limits. Midline structures are within normal limits. Dedicated imaging of the temporal lobes demonstrates symmetric size and signal of the hippocampal structures bilaterally. Postcontrast images demonstrate no pathologic enhancement. Vascular: Flow is present in the major intracranial arteries. Skull and upper cervical spine: The craniocervical junction is normal. Upper cervical spine is within normal limits. Marrow signal is unremarkable. Sinuses/Orbits: A polyp or mucous retention cyst is present posteriorly within the left maxillary sinus. Paranasal sinuses are otherwise clear. Small mastoid effusions are present bilaterally. No obstructing nasopharyngeal lesion is present. Bilateral lens replacements are noted. Globes and orbits are otherwise unremarkable. IMPRESSION: 1. No acute intracranial abnormality or significant interval change. 2. Central pontine T2 hyperintensity without associated enhancement. This may be related to chronic microvascular ischemia. Benign venous lesion is also considered. No other significant white matter disease is present. 3. Small mastoid effusions bilaterally. No obstructing nasopharyngeal lesion is present. 4. Polyp or mucous retention cyst of the left maxillary sinus. Electronically Signed   By: Marin Roberts M.D.   On: 07/01/2023 16:55   EEG adult Result Date: 07/01/2023 Charlsie Quest, MD     07/01/2023 10:08 AM Patient Name: AMEKA KRIGBAUM MRN: [id number] Epilepsy Attending: Charlsie Quest Referring Physician/Provider: Gery Pray, MD Date: 07/01/2023 Duration:  22.14 mins Patient history: 73yo f with seizure like activity. EEG to evaluate for seizure Level of alertness: Awake AEDs during EEG study: LEV, PGB Technical aspects: This EEG study was done with scalp electrodes positioned according to the 10-20 International system of electrode placement. Electrical activity was reviewed with band pass filter of 1-70Hz ,  sensitivity of 7 uV/mm, display speed of 24mm/sec with a 60Hz  notched filter applied as appropriate. EEG data were recorded continuously and digitally stored.  Video monitoring was available and reviewed as appropriate. Description: The posterior dominant rhythm consists of 9 Hz activity of moderate voltage (25-35 uV) seen predominantly in posterior head regions, symmetric and reactive to eye opening and eye closing. Photic driving ws not seen during photic stimulation. Hyperventilation was not performed.   IMPRESSION: This study is within normal limits. No seizures or epileptiform discharges were seen throughout the recording. A normal interictal EEG does not exclude the diagnosis of epilepsy. Priyanka Annabelle Harman   CT HEAD WO CONTRAST ( ) Result Date: 06/30/2023 CLINICAL DATA:  History of recent falls, initial encounter EXAM: CT HEAD WITHOUT CONTRAST TECHNIQUE: Contiguous axial images were obtained from the base of the skull through the vertex without intravenous contrast. RADIATION DOSE REDUCTION: This exam was performed according to the departmental dose-optimization program which includes automated exposure control, adjustment of the mA and/or kV according to patient size and/or use of iterative reconstruction technique. COMPARISON:  02/19/2022 FINDINGS: Brain: No evidence of acute infarction, hemorrhage, hydrocephalus, extra-axial collection or mass lesion/mass effect. Mild atrophic changes are noted. Vascular: No hyperdense vessel or unexpected calcification. Skull: Stable mucosal retention cyst in the left maxillary antrum. Sinuses/Orbits: No acute finding. Other: None. IMPRESSION: Mild atrophic changes without acute abnormality. Electronically Signed   By: Alcide Clever M.D.   On: 06/30/2023 22:32   DG Chest 2 View Result Date: 06/16/2023 CLINICAL DATA:  Shortness of breath EXAM: CHEST - 2 VIEW COMPARISON:  06/12/2023 FINDINGS: Cardiac shadow is within normal limits. Lungs are well aerated bilaterally. No  focal infiltrate or sizable effusion is seen. No bony abnormality is noted. IMPRESSION: No active cardiopulmonary disease. Electronically Signed   By: Alcide Clever M.D.   On: 06/16/2023 19:40    Micro Results     Recent Results (from the past 240 hours)  Resp panel by RT-PCR (RSV, Flu A&B, Covid) Anterior Nasal Swab     Status: None   Collection Time: 07/01/23 12:39 AM   Specimen: Anterior Nasal Swab  Result Value Ref Range Status   SARS Coronavirus 2 by RT PCR NEGATIVE NEGATIVE Final    Comment: (NOTE) SARS-CoV-2 target nucleic acids are NOT DETECTED.  The SARS-CoV-2 RNA is generally detectable in upper respiratory specimens during the acute phase of infection. The lowest concentration of SARS-CoV-2 viral copies this assay can detect is 138 copies/mL. A negative result does not preclude SARS-Cov-2 infection and should not be used as the sole basis for treatment or other patient management decisions. A negative result may occur with  improper specimen collection/handling, submission of specimen other than nasopharyngeal swab, presence of viral mutation(s) within the areas targeted by this assay, and inadequate number of viral copies(<138 copies/mL). A negative result must be combined with clinical observations, patient history, and epidemiological information. The expected result is Negative.  Fact Sheet for Patients:  BloggerCourse.com  Fact Sheet for Healthcare Providers:  SeriousBroker.it  This test is no t yet approved or cleared by the [place] FDA and  has been authorized for detection and/or diagnosis of  SARS-CoV-2 by FDA under an Emergency Use Authorization (EUA). This EUA will remain  in effect (meaning this test can be used) for the duration of the COVID-19 declaration under Section 564(b)(1) of the Act, 21 U.S.C.section 360bbb-3(b)(1), unless the authorization is terminated  or revoked sooner.       Influenza  A by PCR NEGATIVE NEGATIVE Final   Influenza B by PCR NEGATIVE NEGATIVE Final    Comment: (NOTE) The Xpert Xpress SARS-CoV-2/FLU/RSV plus assay is intended as an aid in the diagnosis of influenza from Nasopharyngeal swab specimens and should not be used as a sole basis for treatment. Nasal washings and aspirates are unacceptable for Xpert Xpress SARS-CoV-2/FLU/RSV testing.  Fact Sheet for Patients: BloggerCourse.com  Fact Sheet for Healthcare Providers: SeriousBroker.it  This test is not yet approved or cleared by the [place] FDA and has been authorized for detection and/or diagnosis of SARS-CoV-2 by FDA under an Emergency Use Authorization (EUA). This EUA will remain in effect (meaning this test can be used) for the duration of the COVID-19 declaration under Section 564(b)(1) of the Act, 21 U.S.C. section 360bbb-3(b)(1), unless the authorization is terminated or revoked.     Resp Syncytial Virus by PCR NEGATIVE NEGATIVE Final    Comment: (NOTE) Fact Sheet for Patients: BloggerCourse.com  Fact Sheet for Healthcare Providers: SeriousBroker.it  This test is not yet approved or cleared by the [place] FDA and has been authorized for detection and/or diagnosis of SARS-CoV-2 by FDA under an Emergency Use Authorization (EUA). This EUA will remain in effect (meaning this test can be used) for the duration of the COVID-19 declaration under Section 564(b)(1) of the Act, 21 U.S.C. section 360bbb-3(b)(1), unless the authorization is terminated or revoked.  Performed at [facility], [address] [place], [place] [address]   Culture, blood (Routine X 2) w Reflex to ID Panel     Status: None (Preliminary result)   Collection Time: 07/01/23  2:50 AM   Specimen: BLOOD  Result Value Ref Range Status   Specimen Description   Final    BLOOD RIGHT  ANTECUBITAL Performed at [facility], [address], [place], [place] [address]    Special Requests   Final    BOTTLES DRAWN AEROBIC AND ANAEROBIC Blood Culture results may not be optimal due to an inadequate volume of blood received in culture bottles Performed at [facility], [address] [place], [place] [address]    Culture   Final    NO GROWTH 1 DAY Performed at [facility], [address], [place], [place] [address]    Report Status PENDING  Incomplete  Culture, blood (Routine X 2) w Reflex to ID Panel     Status: None (Preliminary result)   Collection Time: 07/01/23  2:52 AM   Specimen: BLOOD  Result Value Ref Range Status   Specimen Description   Final    BLOOD LEFT ANTECUBITAL Performed at [facility], [address] [place], [place] [address]    Special Requests   Final    BOTTLES DRAWN AEROBIC AND ANAEROBIC Blood Culture results may not be optimal due to an inadequate volume of blood received in culture bottles Performed at [facility], [address], [place], [place] [address]    Culture   Final    NO GROWTH 1 DAY Performed at [facility], [address] [place], [place] [address]    Report Status PENDING  Incomplete    Today   Subjective    Lolita Faulds today has no headache,no chest abdominal pain,no new weakness tingling or numbness, feels much better wants to go home today.     Objective   Blood pressure (!) 155/76, pulse 86, temperature 98.2 F (36.8 C), temperature source Oral, resp. rate 20, height 5\' 3"  (1.6 m), weight 70.8 kg, SpO2 91%.  No intake or output data in the 24 hours ending 07/02/23 1023  Exam  Awake Alert, No new F.N deficits,    [place].AT,PERRAL Supple Neck,   Symmetrical Chest wall movement, Good air movement bilaterally, CTAB RRR,No Gallops,   +ve B.Sounds, Abd Soft, Non tender,  No Cyanosis, Clubbing or edema    Data Review    Recent Labs  Lab 06/30/23 2100 07/01/23 1037 07/02/23 0631  WBC 9.3 8.2 7.7  HGB 13.2 12.0 12.6  HCT 41.4 37.0 39.0  PLT 266 246 229  MCV 92.8 93.0 92.2  MCH 29.6 30.2 29.8  MCHC 31.9 32.4 32.3  RDW 14.3 14.6 14.5  LYMPHSABS  --  2.1 1.2  MONOABS  --  0.5 0.4  EOSABS  --  0.1 0.1  BASOSABS  --  0.0 0.0    Recent Labs  Lab 06/30/23 2100 07/01/23 1037 07/02/23 0631  NA 139 139 139  K 3.3* 4.3 4.4  CL 102 106 105  CO2 25 25 24   ANIONGAP 12 8 10   GLUCOSE 197* 50* 98  BUN 15 11 8   CREATININE 1.17* 0.83 0.79  AST  --   --  15  ALT  --   --  11  ALKPHOS  --   --  48  BILITOT  --   --  0.5  ALBUMIN  --   --  3.0*  CRP  --   --  0.5  AMMONIA  --   --  30  BNP  --   --  89.6  MG  --   --  1.9  PHOS  --   --  3.2  CALCIUM 8.9 8.7* 8.9    Total Time in preparing paper work, data evaluation and todays exam - 35 minutes  Signature  -    Susa Raring M.D on 07/02/2023 at 10:23 AM   -  To page go to [email or web address]

## 2023-07-02 NOTE — Discharge Instructions (Signed)
 Do not drive, operate heavy machinery, perform activities at heights, swimming or participation in water activities or provide baby sitting services until you have seen by Primary MD or a Neurologist and advised to do so again.  Follow with Primary MD Cox, Kirsten, MD in 7 days   Get CBC, CMP, 2 view Chest X ray -  checked next visit with your primary MD    Activity: As tolerated with Full fall precautions use walker/cane & assistance as needed  Disposition Home    Diet: Heart Healthy Low Carb  Special Instructions: If you have smoked or chewed Tobacco  in the last 2 yrs please stop smoking, stop any regular Alcohol  and or any Recreational drug use.  On your next visit with your primary care physician please Get Medicines reviewed and adjusted.  Please request your Prim.MD to go over all Hospital Tests and Procedure/Radiological results at the follow up, please get all Hospital records sent to your Prim MD by signing hospital release before you go home.  If you experience worsening of your admission symptoms, develop shortness of breath, life threatening emergency, suicidal or homicidal thoughts you must seek medical attention immediately by calling 911 or calling your MD immediately  if symptoms less severe.  You Must read complete instructions/literature along with all the possible adverse reactions/side effects for all the Medicines you take and that have been prescribed to you. Take any new Medicines after you have completely understood and accpet all the possible adverse reactions/side effects.   Do not drive when taking Pain medications.  Do not take more than prescribed Pain, Sleep and Anxiety Medications  Wear Seat belts while driving.

## 2023-07-03 ENCOUNTER — Telehealth: Payer: Self-pay

## 2023-07-03 ENCOUNTER — Other Ambulatory Visit: Payer: Self-pay

## 2023-07-03 ENCOUNTER — Other Ambulatory Visit: Payer: Self-pay | Admitting: Family Medicine

## 2023-07-03 DIAGNOSIS — E1142 Type 2 diabetes mellitus with diabetic polyneuropathy: Secondary | ICD-10-CM

## 2023-07-03 MED ORDER — BLOOD GLUCOSE MONITORING SUPPL DEVI: 1.0000 | Freq: Every day | 0 refills | Status: DC

## 2023-07-03 MED ORDER — ONETOUCH DELICA PLUS LANCET33G MISC: 2 refills | Status: AC

## 2023-07-03 MED ORDER — LANCETS MISC. MISC: 1.0000 | Freq: Every day | 0 refills | Status: AC

## 2023-07-03 MED ORDER — BLOOD GLUCOSE TEST VI STRP: 1.0000 | ORAL_STRIP | Freq: Every day | 0 refills | Status: AC

## 2023-07-03 MED ORDER — BLOOD GLUCOSE MONITORING SUPPL DEVI: 1.0000 | Freq: Three times a day (TID) | 0 refills | Status: DC

## 2023-07-03 MED ORDER — LANCET DEVICE MISC: 1.0000 | Freq: Every day | 0 refills | Status: AC

## 2023-07-03 MED ORDER — LANCET DEVICE MISC: 1.0000 | Freq: Every day | 0 refills | Status: DC

## 2023-07-03 MED ORDER — ONETOUCH VERIO VI STRP
ORAL_STRIP | 2 refills | Status: AC
Start: 2023-07-03 — End: ?

## 2023-07-03 NOTE — Telephone Encounter (Unsigned)
 Copied from CRM [id number]. Topic: General - Other >> Jul 03, 2023 11:09 AM Fredrich Romans wrote: Reason for CRM: Patient would like to know if she could have a prescription to get a new meter to check her blood sugar. She would like a phone call if prescription is able to be sent.  [id number] - [place], [place] - [address]  Phone: [phone] Fax: [phone]

## 2023-07-03 NOTE — Transitions of Care (Post Inpatient/ED Visit) (Signed)
 07/03/2023  Name: Hailey Greene MRN: [id number] DOB: 1950-12-03  Today's TOC FU Call Status: Today's TOC FU Call Status:: Successful TOC FU Call Completed TOC FU Call Complete Date: 07/03/23 Patient's Name and Date of Birth confirmed.  Transition Care Management Follow-up Telephone Call Date of Discharge: 06/30/23 Discharge Facility: [facility]) Type of Discharge: Inpatient Admission Primary Inpatient Discharge Diagnosis:: Seizure How have you been since you were released from the hospital?: Same Any questions or concerns?: No  Items Reviewed: Did you receive and understand the discharge instructions provided?: Yes Medications obtained,verified, and reconciled?: Yes (Medications Reviewed) Any new allergies since your discharge?: No Dietary orders reviewed?: Yes Type of Diet Ordered:: Heart Health Low Carb Do you have support at home?: Yes People in Home [RPT]: spouse, child(ren), dependent Name of Support/Comfort Primary Source: home with husband, 86 year old son lives in home  Medications Reviewed Today: Medications Reviewed Today     Reviewed by [name], RN (Registered Nurse) on 07/03/23 at 1711  Med List Status: <None>   Medication Order Taking? Sig Documenting Provider Last Dose Status Informant  albuterol (VENTOLIN HFA) 108 (90 Base) MCG/ACT inhaler [id number] Yes Inhale 2 puffs into the lungs every 6 (six) hours as needed for wheezing or shortness of breath. Cox, Kirsten, MD Taking Active   amitriptyline (ELAVIL) 25 MG tablet [id number] Yes Take 1 tablet (25 mg total) by mouth every evening. Blane Ohara, MD Taking Active   ASPIRIN 81 PO [id number] Yes Take 81 mg by mouth daily. [provider] Taking Active   Blood Glucose Monitoring Suppl DEVI [id number] Yes 1 each by Does not apply route daily. E11.9 May substitute to any manufacturer covered by patient's insurance. Blane Ohara, MD Taking Active   Blood Glucose Monitoring Suppl DEVI [id number] Yes  1 each by Does not apply route in the morning, at noon, and at bedtime. May substitute to any manufacturer covered by patient's insurance. Cox, Kirsten, MD Taking Active   budeson-glycopyrrolate-formoterol (BREZTRI AEROSPHERE) 160-9-4.8 MCG/ACT Sandrea Matte [id number] Yes Inhale 2 puffs into the lungs in the morning and at bedtime. Cox, Kirsten, MD Taking Active   Cyanocobalamin (VITAMIN B-12) 2500 MCG SUBL [id number] Yes Place 1 tablet (2,500 mcg total) under the tongue daily. DISSOLVE 1 TABLET UNDER THE TONGUE DAILY Cox, Kirsten, MD Taking Active   dicyclomine (BENTYL) 20 MG tablet [id number] Yes Take 1 tablet (20 mg total) by mouth 4 (four) times daily as needed (intestinal spasms). Blane Ohara, MD Taking Active   Eszopiclone 3 MG TABS [id number] No Take 1 tablet (3 mg total) by mouth at bedtime.  Patient not taking: Reported on 07/03/2023   CoxFritzi Mandes, MD Not Taking Active   ezetimibe (ZETIA) 10 MG tablet [id number] Yes Take 1 tablet (10 mg total) by mouth daily. Cox, Kirsten, MD Taking Active   fluticasone Aleda Grana) 50 MCG/ACT nasal spray [id number] Yes SPRAY 2 SPRAYS INTO EACH NOSTRIL EVERY DAY Cox, Kirsten, MD Taking Active   Glucose Blood (BLOOD GLUCOSE TEST STRIPS) STRP [id number] Yes 1 each by In Vitro route daily. Use new strip when checking blood sugar 1-2 times daily. May substitute to any manufacturer covered by patient's insurance. Cox, Kirsten, MD Taking Active   glucose blood [place] VERIO) test strip [id number] Yes E11.42 Use new test strip each time when checking FBS Cox, Kirsten, MD Taking Active   hydrOXYzine (VISTARIL) 25 MG capsule [id number] Yes Take 1 capsule (25 mg total) by mouth at bedtime as needed. Blane Ohara, MD Taking Active  icosapent Ethyl (VASCEPA) 1 g capsule [id number] Yes Take 2 capsules (2 g total) by mouth 2 (two) times daily. Cox, Kirsten, MD Taking Active   ipratropium-albuterol (DUONEB) 0.5-2.5 (3) MG/3ML SOLN [id number] Yes Take 3 mLs by nebulization every 6 (six) hours as  needed (SOB. WHEEZING). Blane Ohara, MD Taking Active   Lancet Device MISC [id number] Yes 1 each by Does not apply route daily. May substitute to any manufacturer covered by patient's insurance. Blane Ohara, MD Taking Active   Lancets [facility] Larose Kells PLUS [place]) MISC [id number] Yes E 11.42 Use new lancet each time when checking FBS Cox, Kirsten, MD Taking Active   Lancets Misc. MISC [id number] Yes 1 each by Does not apply route daily. May substitute to any manufacturer covered by patient's insurance. Cox, Kirsten, MD Taking Active   loperamide (IMODIUM) 2 MG capsule [id number] No Take by mouth.  Patient not taking: Reported on 07/03/2023   [provider] Not Taking Active            Med Note [facility], [name]   Mon Jul 03, 2023  5:05 PM) Patient states she hasn't needed  loratadine (CLARITIN) 10 MG tablet [id number] Yes Take 1 tablet (10 mg total) by mouth daily. Cox, Kirsten, MD Taking Active   metoCLOPramide (REGLAN) 10 MG tablet [id number] No Take 1 tablet (10 mg total) by mouth every 6 (six) hours as needed for nausea.  Patient not taking: Reported on 07/03/2023   Blane Ohara, MD Not Taking Active            Med Note [facility], [facility] A   Mon Jul 03, 2023  5:05 PM) Patient states she hasn't needed  Multiple Vitamin (MULTIVITAMIN WITH MINERALS) TABS tablet [id number] Yes Take 1 tablet by mouth daily. [provider] Taking Active Spouse/Significant Other  omeprazole (PRILOSEC) 20 MG capsule [id number] No Take 1 capsule (20 mg total) by mouth daily.  Patient not taking: Reported on 07/03/2023   Blane Ohara, MD Not Taking Active            Med Note [facility], [name]   Mon Jul 03, 2023  5:08 PM) Patient states she just takes pantoprazole  ondansetron (ZOFRAN) 4 MG tablet [id number] No TAKE 1 TABLET BY MOUTH EVERY 6 HOURS AS NEEDED FOR NAUSEA AND VOMITING  Patient not taking: Reported on 07/03/2023   Blane Ohara, MD Not Taking Active            Med Note [facility], [name]   Mon Jul 03, 2023  5:06 PM) Patient states she hasn't needed  pantoprazole (PROTONIX) 40 MG tablet [id number] Yes Take 40 mg by mouth daily. [provider] Taking Active   phentermine (ADIPEX-P) 37.5 MG tablet [id number] Yes Take 1 tablet (37.5 mg total) by mouth every morning. Cox, Kirsten, MD Taking Active   pregabalin (LYRICA) 300 MG capsule [id number] Yes Take 1 capsule (300 mg total) by mouth 2 (two) times daily. Cox, Kirsten, MD Taking Active   promethazine (PHENERGAN) 25 MG tablet [id number] No Take 1 tablet (25 mg total) by mouth every 6 (six) hours as needed for nausea or vomiting.  Patient not taking: Reported on 07/03/2023   CoxFritzi Mandes, MD Not Taking Active            Med Note [facility], Zamyiah Tino A   Mon Jul 03, 2023  5:09 PM) Patient states she has not needed  ramipril (ALTACE) 2.5 MG capsule [id number] Yes Take 1 capsule (2.5 mg total) by mouth daily. Cox,  Kirsten, MD Taking Active   rOPINIRole (REQUIP) 3 MG tablet [id number] Yes Take 1 tablet (3 mg total) by mouth daily 1-3 hours before bedtime. Cox, Kirsten, MD Taking Active   rosuvastatin (CRESTOR) 40 MG tablet [id number] Yes Take 1 tablet (40 mg total) by mouth daily. Cox, Kirsten, MD Taking Active   Semaglutide [facility]) 14 MG TABS [id number] Yes Take 1 tablet (14 mg total) by mouth daily. Cox, Kirsten, MD Taking Active   solifenacin (VESICARE) 5 MG tablet [id number]  Take 1 tablet (5 mg total) by mouth daily. Blane Ohara, MD  Active   [facility] 160-4.5 MCG/ACT inhaler [id number] Yes Inhale 2 puffs into the lungs 2 (two) times daily. [provider] Taking Active   topiramate (TOPAMAX) 50 MG tablet [id number] Yes Take 1 tablet (50 mg total) by mouth daily. Cox, Kirsten, MD Taking Active   triamcinolone cream (KENALOG) 0.1 % [id number] No APPLY 1 APPLICATION TOPICALLY TWICE DAILY  Patient not taking: Reported on 07/03/2023   Blane Ohara, MD Not Taking Active            Med Note [facility], Yaasir Menken A   Mon Jul 03, 2023  5:11 PM) Patient  states she has not needed.   vortioxetine HBr (TRINTELLIX) 20 MG TABS tablet [id number] Yes Take 1 tablet (20 mg total) by mouth daily. Cox, Kirsten, MD Taking Active             Home Care and Equipment/Supplies: Were Home Health Services Ordered?: No (patient states she does not need) Any new equipment or medical supplies ordered?: No  Functional Questionnaire: Do you need assistance with bathing/showering or dressing?: No Do you need assistance with meal preparation?: Yes (husband prepares meals) Do you need assistance with eating?: No Do you have difficulty maintaining continence: Yes (Wears depends at night for urinary incontinence - occasional) Do you need assistance with getting out of bed/getting out of a chair/moving?: No Do you have difficulty managing or taking your medications?: No (husband helps if needed)  Follow up appointments reviewed: PCP Follow-up appointment confirmed?: Yes Date of PCP follow-up appointment?: 07/05/23 Follow-up Provider: PCP: Blane Ohara Specialist [facility] Follow-up appointment confirmed?: Yes Date of Specialist follow-up appointment?: 07/10/23 Follow-Up Specialty Provider:: Windell [place] Do you need transportation to your follow-up appointment?: No Do you understand care options if your condition(s) worsen?: Yes-patient verbalized understanding  SDOH Interventions Today    Flowsheet Row Most Recent Value  SDOH Interventions   Food Insecurity Interventions Intervention Not Indicated  Housing Interventions Intervention Not Indicated  Transportation Interventions Intervention Not Indicated  Utilities Interventions Intervention Not Indicated       Goals Addressed             This Visit's Progress    VBCI Transitions of Care (TOC) Care Plan       Problems:  Recent Hospitalization for treatment of presented with multiple falls/slurred speech/possible seizure-like activity  Patient reports glucometer is broken and is unable to check  sugar - reports PCP office is assisting  Goal:  Over the next 30 days, the patient will not experience hospital readmission  Interventions:  Transitions of Care:  New goal. Doctor Visits  - discussed the importance of doctor visits Educated patient on need for BMP/CBC, 2 view CXR in 1week Discussed planned Procedure planned 4/17 Manus Rudd excision Mass, breast - plans to reschedule and states is non-cancerous  Diabetes Interventions:  (Status:  New goal.) Short Term Goal Assessed patient's understanding of A1c and provided education as well as  suggesting getting high/low parameters at PCP appointment  Provided education to patient about basic DM disease process Reviewed medications with patient and discussed importance of medication adherence Discussed plans with patient for ongoing care management follow up and provided patient with direct contact information for care management team Lab Results  Component Value Date   HGBA1C 5.9 (H) 03/08/2023    Patient Self Care Activities:  Attend all scheduled provider appointments Call pharmacy for medication refills 3-7 days in advance of running out of medications Call provider office for new concerns or questions  Notify RN Care Manager of [facility] call rescheduling needs Participate in Transition of Care Program/Attend TOC scheduled calls Take medications as prescribed   Obtain new glucometer   Plan:  Next PCP appointment scheduled for: 07/05/23 Telephone follow up appointment with care management team member scheduled for:  07/11/23         Hilbert Odor RN, CCM [place]  VBCI-Population Health RN Care Manager [phone]

## 2023-07-03 NOTE — Telephone Encounter (Signed)
 Done. Dr. Sedalia Muta

## 2023-07-04 NOTE — Progress Notes (Unsigned)
 Subjective:  Patient ID: Hailey Greene, female    DOB: [birth year]  Age: 73 y.o. MRN: [id number]  Chief Complaint  Patient presents with   Hospitalization Follow-up    Discussed the use of AI scribe software for clinical note transcription with the patient, who gave verbal consent to proceed.  HPI: Patient is a 73 year old female with history of hypertension, hyperlipidemia, restless leg syndrome, type 2 diabetes, neuropathy, GERD, and major depression, who was brought by family after she had several falls some of which included tonic-clonic clonic activity.  Had some confusion and persistent lipsmacking.  Workup in the emergency department included a CT scan of the brain which was normal with no strokes.  The patient was admitted for workup of seizures.  She was loaded with Keppra on admission and given IV Ativan.  She did become hypotensive in the emergency department and was given 1 L of lactated Ringer's as a bolus.  Updated blood work showed some mild kidney dysfunction and her ABG was abnormal but otherwise was normal.  Patient presents today for hospital follow up.  Was admitted April 4 through April 6.  Her admission she had an MRI of her brain which showed no acute changes.  EEG was negative.  Patient was discharged off Keppra.  Home health care was ordered.  Patient was recommended to follow-up with neurology in approximately 8 weeks. Since coming home the patient has been feeling better.  She has had no falls.  She does have home physical therapy coming out.       07/11/2023    1:25 PM 04/27/2023    8:17 AM 03/08/2023    8:51 AM 11/16/2022    8:58 AM 07/29/2022    9:16 AM  Depression screen PHQ 2/9  Decreased Interest 0 0 0 0 0  Down, Depressed, Hopeless 0 0 0 1 1  PHQ - 2 Score 0 0 0 1 1  Altered sleeping  3 0 2 0  Tired, decreased energy  0 0 2 0  Change in appetite  0 0 2 0  Feeling bad or failure about yourself   0 0 0 0  Trouble concentrating  0 0 0 0  Moving slowly or  fidgety/restless  0 0 2 0  Suicidal thoughts  0 0 0 0  PHQ-9 Score  3 0 9 1  Difficult doing work/chores   Not difficult at all Somewhat difficult Not difficult at all        07/11/2023    1:21 PM  Fall Risk   Falls in the past year? 1  Number falls in past yr: 1  Injury with Fall? 0  Risk for fall due to : History of fall(s)  Follow up Falls prevention discussed;Education provided  Comment Patient confirmed chaning positions slowly    Patient Care Team: Mercy Stall, MD as PCP - General (Family Medicine) Christie Maleigha Colvard, MD as Consulting Physician (Orthopedic Surgery)   Review of Systems  Constitutional:  Negative for chills, fatigue and fever.  HENT:  Negative for congestion, ear pain, rhinorrhea and sore throat.   Respiratory:  Negative for cough and shortness of breath.   Cardiovascular:  Negative for chest pain.  Gastrointestinal:  Negative for abdominal pain, constipation, diarrhea, nausea and vomiting.  Genitourinary:  Negative for dysuria and urgency.  Musculoskeletal:  Negative for back pain and myalgias.  Neurological:  Negative for dizziness, weakness, light-headedness and headaches.  Psychiatric/Behavioral:  Negative for dysphoric mood. The patient is not nervous/anxious.  Current Outpatient Medications on File Prior to Visit  Medication Sig Dispense Refill   albuterol (VENTOLIN HFA) 108 (90 Base) MCG/ACT inhaler Inhale 2 puffs into the lungs every 6 (six) hours as needed for wheezing or shortness of breath. 8 g 2   amitriptyline (ELAVIL) 25 MG tablet Take 1 tablet (25 mg total) by mouth every evening. 90 tablet 1   ASPIRIN 81 PO Take 81 mg by mouth daily.     Blood Glucose Monitoring Suppl DEVI 1 each by Does not apply route daily. E11.9 May substitute to any manufacturer covered by patient's insurance. 1 each 0   Blood Glucose Monitoring Suppl DEVI 1 each by Does not apply route in the morning, at noon, and at bedtime. May substitute to any manufacturer covered by  patient's insurance. 1 each 0   budeson-glycopyrrolate-formoterol (BREZTRI AEROSPHERE) 160-9-4.8 MCG/ACT AERO Inhale 2 puffs into the lungs in the morning and at bedtime.     Cyanocobalamin (VITAMIN B-12) 2500 MCG SUBL Place 1 tablet (2,500 mcg total) under the tongue daily. DISSOLVE 1 TABLET UNDER THE TONGUE DAILY 90 tablet 3   dicyclomine (BENTYL) 20 MG tablet Take 1 tablet (20 mg total) by mouth 4 (four) times daily as needed (intestinal spasms). 120 tablet 1   Eszopiclone 3 MG TABS Take 1 tablet (3 mg total) by mouth at bedtime. 30 tablet 2   ezetimibe (ZETIA) 10 MG tablet Take 1 tablet (10 mg total) by mouth daily. 90 tablet 0   fluticasone (FLONASE) 50 MCG/ACT nasal spray SPRAY 2 SPRAYS INTO EACH NOSTRIL EVERY DAY 48 g 2   Glucose Blood (BLOOD GLUCOSE TEST STRIPS) STRP 1 each by In Vitro route daily. Use new strip when checking blood sugar 1-2 times daily. May substitute to any manufacturer covered by patient's insurance. 100 strip 0   glucose blood (ONETOUCH VERIO) test strip E11.42 Use new test strip each time when checking FBS 100 each 2   hydrOXYzine (VISTARIL) 25 MG capsule Take 1 capsule (25 mg total) by mouth at bedtime as needed. 90 capsule 0   ipratropium-albuterol (DUONEB) 0.5-2.5 (3) MG/3ML SOLN Take 3 mLs by nebulization every 6 (six) hours as needed (SOB. WHEEZING). 360 mL 1   Lancet Device MISC 1 each by Does not apply route daily. May substitute to any manufacturer covered by patient's insurance. 1 each 0   Lancets (ONETOUCH DELICA PLUS LANCET33G) MISC E 11.42 Use new lancet each time when checking FBS 100 each 2   Lancets Misc. MISC 1 each by Does not apply route daily. May substitute to any manufacturer covered by patient's insurance. 100 each 0   loratadine (CLARITIN) 10 MG tablet Take 1 tablet (10 mg total) by mouth daily. 90 tablet 3   Multiple Vitamin (MULTIVITAMIN WITH MINERALS) TABS tablet Take 1 tablet by mouth daily.     pantoprazole (PROTONIX) 40 MG tablet Take 40 mg  by mouth daily.     phentermine (ADIPEX-P) 37.5 MG tablet Take 1 tablet (37.5 mg total) by mouth every morning. 30 tablet 0   pregabalin (LYRICA) 300 MG capsule Take 1 capsule (300 mg total) by mouth 2 (two) times daily. 60 capsule 2   ramipril (ALTACE) 2.5 MG capsule Take 1 capsule (2.5 mg total) by mouth daily. 90 capsule 0   rOPINIRole (REQUIP) 3 MG tablet Take 1 tablet (3 mg total) by mouth daily 1-3 hours before bedtime. 90 tablet 0   rosuvastatin (CRESTOR) 40 MG tablet Take 1 tablet (40 mg total) by mouth  daily. 90 tablet 0   Semaglutide (RYBELSUS) 14 MG TABS Take 1 tablet (14 mg total) by mouth daily. 90 tablet 0   solifenacin (VESICARE) 5 MG tablet Take 1 tablet (5 mg total) by mouth daily. 90 tablet 0   SYMBICORT 160-4.5 MCG/ACT inhaler Inhale 2 puffs into the lungs 2 (two) times daily.     topiramate (TOPAMAX) 50 MG tablet Take 1 tablet (50 mg total) by mouth daily. 90 tablet 0   vortioxetine HBr (TRINTELLIX) 20 MG TABS tablet Take 1 tablet (20 mg total) by mouth daily. 90 tablet 0   No current facility-administered medications on file prior to visit.   Past Medical History:  Diagnosis Date   2019 novel coronavirus disease (COVID-19) 12/31/2018   Acute hypoxemic respiratory failure due to COVID-19 [facility])    Chicken pox    Depression    Diabetes (HCC)    GERD (gastroesophageal reflux disease)    Hypertension    Idiopathic progressive neuropathy    Major depressive disorder    Mixed hyperlipidemia    Neuropathy    Osteoarthritis    Pneumonia    Primary insomnia    Primary osteoarthritis of left knee 05/31/2019   RLS (restless legs syndrome)    S/P total knee replacement 07/15/2019   Status post total knee replacement 07/25/2019   Tachycardia    Urge incontinence    UTI (lower urinary tract infection)    Weakness    Past Surgical History:  Procedure Laterality Date   ABDOMINAL HYSTERECTOMY     APPENDECTOMY     BREAST BIOPSY Right 04/26/2023   US  RT BREAST BX W LOC DEV 1ST  LESION IMG BX SPEC US  GUIDE 04/26/2023 [facility]   CHOLECYSTECTOMY     HERNIA REPAIR     REPLACEMENT TOTAL KNEE Right    TONSILLECTOMY     TOTAL KNEE ARTHROPLASTY Left 07/15/2019   Procedure: TOTAL KNEE ARTHROPLASTY;  Surgeon: Christie Neelie Welshans, MD;  Location: [facility];  Service: Orthopedics;  Laterality: Left;    Family History  Problem Relation Age of Onset   Thyroid disease Mother    Cancer Mother    Migraines Mother    Brain cancer Father    Heart disease Maternal Grandmother    Diabetes Daughter    Social History   Socioeconomic History   Marital status: Married    Spouse name: Not on file   Number of children: 3   Years of education: 12   Highest education level: Not on file  Occupational History   Occupation: Housewife  Tobacco Use   Smoking status: Former    Current packs/day: 0.00    Average packs/day: 2.0 packs/day for 12.0 years (24.0 ttl pk-yrs)    Types: Cigarettes    Start date: 05/25/1998    Quit date: 05/25/2010    Years since quitting: 13.1   Smokeless tobacco: Never  Vaping Use   Vaping status: Never Used  Substance and Sexual Activity   Alcohol use: Yes    Comment: occasional   Drug use: No   Sexual activity: Not on file  Other Topics Concern   Not on file  Social History Narrative   Born and raised in [place], [place]. Currently lives in a house with her husband. 1 dog. Fun: Garden, feed birds, swimming   Denies any religious beliefs effecting health care.    Social Drivers of Health   Financial Resource Strain: Low Risk  (03/08/2023)   Overall Financial Resource Strain (CARDIA)  Difficulty of Paying Living Expenses: Not hard at all  Food Insecurity: No Food Insecurity (07/03/2023)   Hunger Vital Sign    Worried About Running Out of Food in the Last Year: Never true    Ran Out of Food in the Last Year: Never true  Transportation Needs: No Transportation Needs (07/03/2023)   PRAPARE - Administrator, Civil Service (Medical): No    Lack  of Transportation (Non-Medical): No  Physical Activity: Insufficiently Active (03/08/2023)   Exercise Vital Sign    Days of Exercise per Week: 7 days    Minutes of Exercise per Session: 20 min  Stress: No Stress Concern Present (03/08/2023)   [name] of Occupational Health - Occupational Stress Questionnaire    Feeling of Stress : Not at all  Social Connections: Moderately Integrated (07/05/2023)   Social Connection and Isolation Panel [NHANES]    Frequency of Communication with Friends and Family: More than three times a week    Frequency of Social Gatherings with Friends and Family: More than three times a week    Attends Religious Services: More than 4 times per year    Active Member of [employer] or Organizations: No    Attends Engineer, structural: Never    Marital Status: Married    Objective:  BP 124/80   Pulse 85   Temp 98.2 F (36.8 C)   Ht 5\' 3"  (1.6 m)   Wt 172 lb (78 kg)   SpO2 98%   BMI 30.47 kg/m      07/10/2023   10:15 AM 07/10/2023   10:02 AM 07/05/2023   10:45 AM  BP/Weight  Systolic BP 137 137 124  Diastolic BP 79 79 80  Wt. (Lbs) 175 175.5 172  BMI 31 kg/m2 31.09 kg/m2 30.47 kg/m2    Physical Exam Vitals reviewed.  Constitutional:      Appearance: Normal appearance. She is obese.  Neck:     Vascular: No carotid bruit.  Cardiovascular:     Rate and Rhythm: Normal rate and regular rhythm.     Heart sounds: Normal heart sounds.  Pulmonary:     Effort: Pulmonary effort is normal. No respiratory distress.     Breath sounds: Normal breath sounds.  Abdominal:     General: Abdomen is flat. Bowel sounds are normal.     Palpations: Abdomen is soft.     Tenderness: There is no abdominal tenderness.  Neurological:     Mental Status: She is alert and oriented to person, place, and time.  Psychiatric:        Mood and Affect: Mood normal.        Behavior: Behavior normal.     Diabetic Foot Exam - Simple   Simple Foot Form  07/04/2023  9:24  PM  Visual Inspection No deformities, no ulcerations, no other skin breakdown bilaterally: Yes Sensation Testing Intact to touch and monofilament testing bilaterally: Yes Pulse Check Posterior Tibialis and Dorsalis pulse intact bilaterally: Yes Comments      Lab Results  Component Value Date   WBC 9.6 07/05/2023   HGB 14.8 07/05/2023   HCT 45.7 07/05/2023   PLT 271 07/05/2023   GLUCOSE 92 07/05/2023   CHOL 158 03/08/2023   TRIG 234 (H) 03/08/2023   HDL 62 03/08/2023   LDLCALC 59 03/08/2023   ALT 20 07/05/2023   AST 26 07/05/2023   NA 138 07/05/2023   K 5.2 07/05/2023   CL 100 07/05/2023   CREATININE 0.69  07/05/2023   BUN 10 07/05/2023   CO2 21 07/05/2023   TSH 0.694 07/02/2023   HGBA1C 5.9 (H) 03/08/2023   MICROALBUR 30 09/25/2020      Assessment & Plan:   Syncope, unspecified syncope type Assessment & Plan: Order long-term heart monitor. Check labs  Orders: -     CBC with Differential/Platelet -     Comprehensive metabolic panel with GFR -     LONG TERM MONITOR (3-14 DAYS); Future  Diabetic polyneuropathy associated with type 2 diabetes mellitus (HCC) Assessment & Plan: Control: good Recommend check sugars fasting daily. Medicines: rybelsus.   Essential hypertension, benign Assessment & Plan: At goal. Not on meds. .  Healthy diet.   Idiopathic progressive neuropathy Assessment & Plan: The current medical regimen is effective;  continue present plan and medications. Lyrica 300 mg twice daily.   Class 1 obesity due to excess calories with serious comorbidity and body mass index (BMI) of 31.0 to 31.9 in adult Assessment & Plan: Recommend continue to work on eating healthy diet and exercise. Comorbidities: diabetes, hyperlipidemia.      No orders of the defined types were placed in this encounter.   Orders Placed This Encounter  Procedures   CBC with Differential/Platelet   Comprehensive metabolic panel with GFR   LONG TERM MONITOR (3-14  DAYS)     Follow-up: Return in about 6 weeks (around 08/16/2023) for chronic follow up.   I,Marla I Leal-Borjas,acting as a scribe for Mercy Stall, MD.,have documented all relevant documentation on the behalf of Mercy Stall, MD,as directed by  Mercy Stall, MD while in the presence of Mercy Stall, MD.   An After Visit Summary was printed and given to the patient. I attest that I have reviewed this visit and agree with the plan scribed by my staff.   Mercy Stall, MD [facility] [phone]

## 2023-07-05 ENCOUNTER — Ambulatory Visit (INDEPENDENT_AMBULATORY_CARE_PROVIDER_SITE_OTHER): Admitting: Family Medicine

## 2023-07-05 ENCOUNTER — Encounter: Payer: Self-pay | Admitting: Family Medicine

## 2023-07-05 ENCOUNTER — Ambulatory Visit: Attending: Family Medicine

## 2023-07-05 VITALS — BP 124/80 | HR 85 | Temp 98.2°F | Ht 63.0 in | Wt 172.0 lb

## 2023-07-05 DIAGNOSIS — E66811 Obesity, class 1: Secondary | ICD-10-CM

## 2023-07-05 DIAGNOSIS — E1142 Type 2 diabetes mellitus with diabetic polyneuropathy: Secondary | ICD-10-CM

## 2023-07-05 DIAGNOSIS — R55 Syncope and collapse: Secondary | ICD-10-CM

## 2023-07-05 DIAGNOSIS — Z6831 Body mass index (BMI) 31.0-31.9, adult: Secondary | ICD-10-CM

## 2023-07-05 DIAGNOSIS — I10 Essential (primary) hypertension: Secondary | ICD-10-CM

## 2023-07-05 DIAGNOSIS — G603 Idiopathic progressive neuropathy: Secondary | ICD-10-CM | POA: Diagnosis not present

## 2023-07-05 DIAGNOSIS — E6609 Other obesity due to excess calories: Secondary | ICD-10-CM

## 2023-07-05 LAB — CULTURE, BLOOD (ROUTINE X 2): Culture  Setup Time: NO GROWTH

## 2023-07-05 NOTE — Progress Notes (Unsigned)
 EP to read.

## 2023-07-06 ENCOUNTER — Encounter: Payer: Self-pay | Admitting: Family Medicine

## 2023-07-06 LAB — CBC WITH DIFFERENTIAL/PLATELET
Basophils Absolute: 0.1 10*3/uL (ref 0.0–0.2)
Basos: 1 %
EOS (ABSOLUTE): 0.1 10*3/uL (ref 0.0–0.4)
Eos: 2 %
Hematocrit: 45.7 % (ref 34.0–46.6)
Hemoglobin: 14.8 g/dL (ref 11.1–15.9)
Immature Grans (Abs): 0.1 10*3/uL (ref 0.0–0.1)
Immature Granulocytes: 1 %
Lymphocytes Absolute: 2.2 10*3/uL (ref 0.7–3.1)
Lymphs: 23 %
MCH: 29.4 pg (ref 26.6–33.0)
MCHC: 32.4 g/dL (ref 31.5–35.7)
MCV: 91 fL (ref 79–97)
Monocytes Absolute: 0.6 10*3/uL (ref 0.1–0.9)
Monocytes: 6 %
Neutrophils Absolute: 6.6 10*3/uL (ref 1.4–7.0)
Neutrophils: 67 %
Platelets: 271 10*3/uL (ref 150–450)
RBC: 5.03 x10E6/uL (ref 3.77–5.28)
RDW: 13.7 % (ref 11.7–15.4)
WBC: 9.6 10*3/uL (ref 3.4–10.8)

## 2023-07-06 LAB — COMPREHENSIVE METABOLIC PANEL WITH GFR
ALT: 20 IU/L (ref 0–32)
AST: 26 IU/L (ref 0–40)
Albumin: 4.2 g/dL (ref 3.8–4.8)
Alkaline Phosphatase: 84 IU/L (ref 44–121)
BUN/Creatinine Ratio: 14 (ref 12–28)
BUN: 10 mg/dL (ref 8–27)
Bilirubin Total: 0.4 mg/dL (ref 0.0–1.2)
CO2: 21 mmol/L (ref 20–29)
Calcium: 9.7 mg/dL (ref 8.7–10.3)
Chloride: 100 mmol/L (ref 96–106)
Creatinine, Ser: 0.69 mg/dL (ref 0.57–1.00)
Globulin, Total: 2.2 g/dL (ref 1.5–4.5)
Glucose: 92 mg/dL (ref 70–99)
Potassium: 5.2 mmol/L (ref 3.5–5.2)
Sodium: 138 mmol/L (ref 134–144)
Total Protein: 6.4 g/dL (ref 6.0–8.5)
eGFR: 92 mL/min/{1.73_m2} (ref >59)

## 2023-07-06 LAB — CULTURE, BLOOD (ROUTINE X 2): Culture: NO GROWTH

## 2023-07-08 DIAGNOSIS — R55 Syncope and collapse: Secondary | ICD-10-CM | POA: Insufficient documentation

## 2023-07-08 NOTE — Assessment & Plan Note (Signed)
 Order long-term heart monitor. Check labs

## 2023-07-10 ENCOUNTER — Ambulatory Visit (INDEPENDENT_AMBULATORY_CARE_PROVIDER_SITE_OTHER): Admitting: Neurology

## 2023-07-10 ENCOUNTER — Other Ambulatory Visit: Payer: Self-pay | Admitting: Family Medicine

## 2023-07-10 ENCOUNTER — Encounter

## 2023-07-10 ENCOUNTER — Telehealth: Payer: Self-pay | Admitting: Neurology

## 2023-07-10 ENCOUNTER — Encounter: Payer: Self-pay | Admitting: Neurology

## 2023-07-10 VITALS — BP 137/79 | HR 73 | Ht 63.0 in | Wt 175.5 lb

## 2023-07-10 DIAGNOSIS — R55 Syncope and collapse: Secondary | ICD-10-CM | POA: Diagnosis not present

## 2023-07-10 DIAGNOSIS — R296 Repeated falls: Secondary | ICD-10-CM | POA: Diagnosis not present

## 2023-07-10 DIAGNOSIS — E782 Mixed hyperlipidemia: Secondary | ICD-10-CM

## 2023-07-10 NOTE — Telephone Encounter (Signed)
[facility] is taking this patient. 

## 2023-07-10 NOTE — Patient Instructions (Addendum)
 Referral for Home Physical therapy due to recents falls  Increase water intake  Continue your other medications  Continue to follow up with PCP  Return as needed

## 2023-07-10 NOTE — Progress Notes (Signed)
 [facility]  PATIENT: Hailey Greene DOB: Sep 01, 1950  REQUESTING CLINICIAN: Lynnae January, NP HISTORY FROM: Patient and husband  REASON FOR VISIT: Seizure like activity    HISTORICAL  CHIEF COMPLAINT:  Chief Complaint  Patient presents with   New Patient (Initial Visit)    Rm12, alone, NP Internal referral for seizure like activity: 2 weeks ago, frequent falls, pt stated had 3 sz episodes in past month     HISTORY OF PRESENT ILLNESS:  This is 73 year old woman past medical history of hypertension, hyperlipidemia, diabetes mellitus, who is presenting for seizure versus syncope.  Patient presented to the hospital on April 4 after having a fall and shaking episode witnessed by husband.  Husband tells me that the day prior, patient had 3 falls, she denies any warning sign prior to this fall, and no injuries.  On the day of admission, she had a fall associated with some shaking around the mouth and upper extremities.  This was very brief, patient will did not have any postictal confusion.  She was aware and actually told her husband not to call EMS but to drive her to the hospital in his personal car.  They presented to the ED, initial workup including EEG and MRI was unrevealing.  Patient was not started on medication.  Review of the medication list indicate that she is already on Topamax 50 mg daily but does not know the indication.  She denies any previous history of migraine headaches.  She said that her current PCP is helping her optimizing her medications.    OTHER MEDICAL CONDITIONS: Hypertension, Hypertension, Diabetes, Diabetic neuropathy   REVIEW OF SYSTEMS: Full 14 system review of systems performed and negative with exception of: As noted in the HPI   ALLERGIES: No Known Allergies  HOME MEDICATIONS: Outpatient Medications Prior to Visit  Medication Sig Dispense Refill   albuterol (VENTOLIN HFA) 108 (90 Base) MCG/ACT inhaler Inhale 2 puffs into the  lungs every 6 (six) hours as needed for wheezing or shortness of breath. 8 g 2   amitriptyline (ELAVIL) 25 MG tablet Take 1 tablet (25 mg total) by mouth every evening. 90 tablet 1   ASPIRIN 81 PO Take 81 mg by mouth daily.     Blood Glucose Monitoring Suppl DEVI 1 each by Does not apply route daily. E11.9 May substitute to any manufacturer covered by patient's insurance. 1 each 0   Blood Glucose Monitoring Suppl DEVI 1 each by Does not apply route in the morning, at noon, and at bedtime. May substitute to any manufacturer covered by patient's insurance. 1 each 0   budeson-glycopyrrolate-formoterol (BREZTRI AEROSPHERE) 160-9-4.8 MCG/ACT AERO Inhale 2 puffs into the lungs in the morning and at bedtime.     Cyanocobalamin (VITAMIN B-12) 2500 MCG SUBL Place 1 tablet (2,500 mcg total) under the tongue daily. DISSOLVE 1 TABLET UNDER THE TONGUE DAILY 90 tablet 3   dicyclomine (BENTYL) 20 MG tablet Take 1 tablet (20 mg total) by mouth 4 (four) times daily as needed (intestinal spasms). 120 tablet 1   Eszopiclone 3 MG TABS Take 1 tablet (3 mg total) by mouth at bedtime. 30 tablet 2   ezetimibe (ZETIA) 10 MG tablet Take 1 tablet (10 mg total) by mouth daily. 90 tablet 0   fluticasone (FLONASE) 50 MCG/ACT nasal spray SPRAY 2 SPRAYS INTO EACH NOSTRIL EVERY DAY 48 g 2   Glucose Blood (BLOOD GLUCOSE TEST STRIPS) STRP 1 each by In Vitro route daily. Use new strip  when checking blood sugar 1-2 times daily. May substitute to any manufacturer covered by patient's insurance. 100 strip 0   glucose blood (ONETOUCH VERIO) test strip E11.42 Use new test strip each time when checking FBS 100 each 2   hydrOXYzine (VISTARIL) 25 MG capsule Take 1 capsule (25 mg total) by mouth at bedtime as needed. 90 capsule 0   icosapent Ethyl (VASCEPA) 1 g capsule TAKE 2 CAPSULES BY MOUTH TWICE  DAILY 120 capsule 11   ipratropium-albuterol (DUONEB) 0.5-2.5 (3) MG/3ML SOLN Take 3 mLs by nebulization every 6 (six) hours as needed (SOB.  WHEEZING). 360 mL 1   Lancet Device MISC 1 each by Does not apply route daily. May substitute to any manufacturer covered by patient's insurance. 1 each 0   Lancets (ONETOUCH DELICA PLUS LANCET33G) MISC E 11.42 Use new lancet each time when checking FBS 100 each 2   Lancets Misc. MISC 1 each by Does not apply route daily. May substitute to any manufacturer covered by patient's insurance. 100 each 0   loratadine (CLARITIN) 10 MG tablet Take 1 tablet (10 mg total) by mouth daily. 90 tablet 3   Multiple Vitamin (MULTIVITAMIN WITH MINERALS) TABS tablet Take 1 tablet by mouth daily.     pantoprazole (PROTONIX) 40 MG tablet Take 40 mg by mouth daily.     phentermine (ADIPEX-P) 37.5 MG tablet Take 1 tablet (37.5 mg total) by mouth every morning. 30 tablet 0   pregabalin (LYRICA) 300 MG capsule Take 1 capsule (300 mg total) by mouth 2 (two) times daily. 60 capsule 2   ramipril (ALTACE) 2.5 MG capsule Take 1 capsule (2.5 mg total) by mouth daily. 90 capsule 0   rOPINIRole (REQUIP) 3 MG tablet Take 1 tablet (3 mg total) by mouth daily 1-3 hours before bedtime. 90 tablet 0   rosuvastatin (CRESTOR) 40 MG tablet Take 1 tablet (40 mg total) by mouth daily. 90 tablet 0   Semaglutide (RYBELSUS) 14 MG TABS Take 1 tablet (14 mg total) by mouth daily. 90 tablet 0   solifenacin (VESICARE) 5 MG tablet Take 1 tablet (5 mg total) by mouth daily. 90 tablet 0   SYMBICORT 160-4.5 MCG/ACT inhaler Inhale 2 puffs into the lungs 2 (two) times daily.     topiramate (TOPAMAX) 50 MG tablet Take 1 tablet (50 mg total) by mouth daily. 90 tablet 0   vortioxetine HBr (TRINTELLIX) 20 MG TABS tablet Take 1 tablet (20 mg total) by mouth daily. 90 tablet 0   No facility-administered medications prior to visit.    PAST MEDICAL HISTORY: Past Medical History:  Diagnosis Date   2019 novel coronavirus disease (COVID-19) 12/31/2018   Acute hypoxemic respiratory failure due to COVID-19 (HCC)    Chicken pox    Depression    Diabetes  (HCC)    GERD (gastroesophageal reflux disease)    Hypertension    Idiopathic progressive neuropathy    Major depressive disorder    Mixed hyperlipidemia    Neuropathy    Osteoarthritis    Pneumonia    Primary insomnia    Primary osteoarthritis of left knee 05/31/2019   RLS (restless legs syndrome)    S/P total knee replacement 07/15/2019   Status post total knee replacement 07/25/2019   Tachycardia    Urge incontinence    UTI (lower urinary tract infection)    Weakness     PAST SURGICAL HISTORY: Past Surgical History:  Procedure Laterality Date   ABDOMINAL HYSTERECTOMY     APPENDECTOMY  BREAST BIOPSY Right 04/26/2023   [place] RT BREAST BX W LOC DEV 1ST LESION IMG BX SPEC US GUIDE 04/26/2023 [facility]   CHOLECYSTECTOMY     HERNIA REPAIR     REPLACEMENT TOTAL KNEE Right    TONSILLECTOMY     TOTAL KNEE ARTHROPLASTY Left 07/15/2019   Procedure: TOTAL KNEE ARTHROPLASTY;  Surgeon: Dannielle Huh, MD;  Location: [facility];  Service: Orthopedics;  Laterality: Left;    FAMILY HISTORY: Family History  Problem Relation Age of Onset   Thyroid disease Mother    Cancer Mother    Migraines Mother    Brain cancer Father    Heart disease Maternal Grandmother    Diabetes Daughter     SOCIAL HISTORY: Social History   Socioeconomic History   Marital status: Married    Spouse name: Not on file   Number of children: 3   Years of education: 12   Highest education level: Not on file  Occupational History   Occupation: Housewife  Tobacco Use   Smoking status: Former    Current packs/day: 0.00    Average packs/day: 2.0 packs/day for 12.0 years (24.0 ttl pk-yrs)    Types: Cigarettes    Start date: 05/25/1998    Quit date: 05/25/2010    Years since quitting: 13.1   Smokeless tobacco: Never  Vaping Use   Vaping status: Never Used  Substance and Sexual Activity   Alcohol use: Yes    Comment: occasional   Drug use: No   Sexual activity: Not on file  Other Topics Concern   Not on  file  Social History Narrative   Born and raised in [place], [place]. Currently lives in a house with her husband. 1 dog. Fun: Garden, feed birds, swimming   Denies any religious beliefs effecting health care.    Social Drivers of Corporate investment banker Strain: Low Risk  (03/08/2023)   Overall Financial Resource Strain (CARDIA)    Difficulty of Paying Living Expenses: Not hard at all  Food Insecurity: No Food Insecurity (07/03/2023)   Hunger Vital Sign    Worried About Running Out of Food in the Last Year: Never true    Ran Out of Food in the Last Year: Never true  Transportation Needs: No Transportation Needs (07/03/2023)   PRAPARE - Administrator, Civil Service (Medical): No    Lack of Transportation (Non-Medical): No  Physical Activity: Insufficiently Active (03/08/2023)   Exercise Vital Sign    Days of Exercise per Week: 7 days    Minutes of Exercise per Session: 20 min  Stress: No Stress Concern Present (03/08/2023)   [name] of Occupational Health - Occupational Stress Questionnaire    Feeling of Stress : Not at all  Social Connections: Moderately Integrated (07/05/2023)   Social Connection and Isolation Panel [NHANES]    Frequency of Communication with Friends and Family: More than three times a week    Frequency of Social Gatherings with Friends and Family: More than three times a week    Attends Religious Services: More than 4 times per year    Active Member of [employer] or Organizations: No    Attends Banker Meetings: Never    Marital Status: Married  Catering manager Violence: Not At Risk (07/03/2023)   Humiliation, Afraid, Rape, and Kick questionnaire    Fear of Current or Ex-Partner: No    Emotionally Abused: No    Physically Abused: No    Sexually Abused: No  PHYSICAL EXAM  GENERAL EXAM/CONSTITUTIONAL: Vitals:  Vitals:   07/10/23 1002  BP: 137/79  Pulse: 73  Weight: 175 lb 8 oz (79.6 kg)  Height: 5\' 3"  (1.6 m)   Body mass  index is 31.09 kg/m. Wt Readings from Last 3 Encounters:  07/10/23 175 lb 8 oz (79.6 kg)  07/05/23 172 lb (78 kg)  06/30/23 156 lb (70.8 kg)   Patient is in no distress; well developed, nourished and groomed; neck is supple  MUSCULOSKELETAL: Gait, strength, tone, movements noted in Neurologic exam below  NEUROLOGIC: MENTAL STATUS:      No data to display         awake, alert, oriented to person, place and time recent and remote memory intact normal attention and concentration language fluent, comprehension intact, naming intact fund of knowledge appropriate  CRANIAL NERVE:  2nd, 3rd, 4th, 6th - Visual fields full to confrontation, extraocular muscles intact, no nystagmus 5th - facial sensation symmetric 7th - facial strength symmetric 8th - hearing intact 9th - palate elevates symmetrically, uvula midline 11th - shoulder shrug symmetric 12th - tongue protrusion midline  MOTOR:  normal bulk and tone, full strength in the BUE, BLE  SENSORY:  normal and symmetric to light touch  COORDINATION:  finger-nose-finger, fine finger movements normal  GAIT/STATION:  normal   DIAGNOSTIC DATA (LABS, IMAGING, TESTING) - I reviewed patient records, labs, notes, testing and imaging myself where available.  Lab Results  Component Value Date   WBC 9.6 07/05/2023   HGB 14.8 07/05/2023   HCT 45.7 07/05/2023   MCV 91 07/05/2023   PLT 271 07/05/2023      Component Value Date/Time   NA 138 07/05/2023 1121   K 5.2 07/05/2023 1121   CL 100 07/05/2023 1121   CO2 21 07/05/2023 1121   GLUCOSE 92 07/05/2023 1121   GLUCOSE 98 07/02/2023 0631   BUN 10 07/05/2023 1121   CREATININE 0.69 07/05/2023 1121   CALCIUM 9.7 07/05/2023 1121   PROT 6.4 07/05/2023 1121   ALBUMIN 4.2 07/05/2023 1121   AST 26 07/05/2023 1121   ALT 20 07/05/2023 1121   ALKPHOS 84 07/05/2023 1121   BILITOT 0.4 07/05/2023 1121   GFRNONAA >60 07/02/2023 0631   GFRAA 77 03/24/2020 1001   Lab Results   Component Value Date   CHOL 158 03/08/2023   HDL 62 03/08/2023   LDLCALC 59 03/08/2023   TRIG 234 (H) 03/08/2023   Lab Results  Component Value Date   HGBA1C 5.9 (H) 03/08/2023   Lab Results  Component Value Date   VITAMINB12 906 11/16/2022   Lab Results  Component Value Date   TSH 0.694 07/02/2023   MRI Brain 07/01/2023 1. No acute intracranial abnormality or significant interval change. 2. Central pontine T2 hyperintensity without associated enhancement. This may be related to chronic microvascular ischemia. Benign venous lesion is also considered. No other significant white matter disease is present. 3. Small mastoid effusions bilaterally. No obstructing nasopharyngeal lesion is present. 4. Polyp or mucous retention cyst of the left maxillary sinus.   EEG 06/30/2023 This study is within normal limits. No seizures or epileptiform discharges were seen throughout the recording.    ASSESSMENT AND PLAN  73 y.o. year old female  with hypertension, hyperlipidemia, diabetes mellitus, who is presenting with falls, syncope versus seizures.  Based on description I suspect these events are syncopal episodes.  Her EEG negative for any epileptiform discharges and MRI brain with no acute abnormality.  Plan for patient will  be to increase her fluid intake, continue to follow-up with Dr. Reinhold Carbine.  I will also send her to home PT for multiple falls.  She voiced understanding.  Return as needed.   1. Syncope, unspecified syncope type   2. Falls     Patient Instructions  Referral for Home Physical therapy due to recents falls  Increase water intake  Continue your other medications  Continue to follow up with PCP  Return as needed    Per [place]  DMV statutes, patients with seizures are not allowed to drive until they have been seizure-free for six months.  Other recommendations include using caution when using heavy equipment or power tools. Avoid working on ladders or at heights. Take  showers instead of baths.  Do not swim alone.  Ensure the water temperature is not too high on the home water heater. Do not go swimming alone. Do not lock yourself in a room alone (i.e. bathroom). When caring for infants or small children, sit down when holding, feeding, or changing them to minimize risk of injury to the child in the event you have a seizure. Maintain good sleep hygiene. Avoid alcohol.  Also recommend adequate sleep, hydration, good diet and minimize stress.   During the Seizure  - First, ensure adequate ventilation and place patients on the floor on their left side  Loosen clothing around the neck and ensure the airway is patent. If the patient is clenching the teeth, do not force the mouth open with any object as this can cause severe damage - Remove all items from the surrounding that can be hazardous. The patient may be oblivious to what's happening and may not even know what he or she is doing. If the patient is confused and wandering, either gently guide him/her away and block access to outside areas - Reassure the individual and be comforting - Call 911. In most cases, the seizure ends before EMS arrives. However, there are cases when seizures may last over 3 to 5 minutes. Or the individual may have developed breathing difficulties or severe injuries. If a pregnant patient or a person with diabetes develops a seizure, it is prudent to call an ambulance. - Finally, if the patient does not regain full consciousness, then call EMS. Most patients will remain confused for about 45 to 90 minutes after a seizure, so you must use judgment in calling for help. - Avoid restraints but make sure the patient is in a bed with padded side rails - Place the individual in a lateral position with the neck slightly flexed; this will help the saliva drain from the mouth and prevent the tongue from falling backward - Remove all nearby furniture and other hazards from the area - Provide verbal  assurance as the individual is regaining consciousness - Provide the patient with privacy if possible - Call for help and start treatment as ordered by the caregiver   After the Seizure (Postictal Stage)  After a seizure, most patients experience confusion, fatigue, muscle pain and/or a headache. Thus, one should permit the individual to sleep. For the next few days, reassurance is essential. Being calm and helping reorient the person is also of importance.  Most seizures are painless and end spontaneously. Seizures are not harmful to others but can lead to complications such as stress on the lungs, brain and the heart. Individuals with prior lung problems may develop labored breathing and respiratory distress.    Discussed Patients with epilepsy have a small risk of sudden  unexpected death, a condition referred to as sudden unexpected death in epilepsy (SUDEP). SUDEP is defined specifically as the sudden, unexpected, witnessed or unwitnessed, nontraumatic and nondrowning death in patients with epilepsy with or without evidence for a seizure, and excluding documented status epilepticus, in which post mortem examination does not reveal a structural or toxicologic cause for death     Orders Placed This Encounter  Procedures   Ambulatory referral to Home Health    No orders of the defined types were placed in this encounter.   Return if symptoms worsen or fail to improve.    Cassandra Cleveland, MD 07/10/2023, 12:49 PM  [facility] [address], [address] [place], [place] [address] [phone]

## 2023-07-11 ENCOUNTER — Encounter

## 2023-07-11 ENCOUNTER — Other Ambulatory Visit: Payer: Self-pay

## 2023-07-11 NOTE — Patient Instructions (Signed)
 Visit Information  Thank you for taking time to visit with me today. Please don't hesitate to contact me if I can be of assistance to you before our next scheduled telephone appointment.  Our next appointment is by telephone on 07/18/23 at 11am  Following is a copy of your care plan:   Goals Addressed             This Visit's Progress    VBCI Transitions of Care (TOC) Care Plan       Problems:  Recent Hospitalization for treatment of presented with multiple falls/slurred speech/possible seizure-like activity  Patient reports glucometer is broken and is unable to check sugar - reports PCP office is assisting - 07/11/23 Patient reports PCP office helped and she has new glucometer - states [facility] nurse was present today for AWV and blood sugar was 113   Goal:  Over the next 30 days, the patient will not experience hospital readmission  Interventions:  Transitions of Care:  Goal on track:  Yes. Doctor Visits  - discussed the importance of doctor visits - patient seen by PCP and neurology Educated patient on need for BMP/CBC, 2 view CXR in 1week - 07/11/23 reviewed with patient who confirmed having blood work but no CXR - discussed with patient page 2 of discharge paperwork states CXR 1 week - patient states she will send a message to Dr Reinhold Carbine via portal  Discussed planned Procedure planned 4/17 Dareen Ebbing excision Mass, breast - plans to reschedule and states is non-cancerous - 07/11/23 Patient confirmed this has been rescheduled to 07/27/23 Educated on fall prevention/safety Discussed patient possibly switching from Rybelsus to Ozempic - states she will discuss with PCP at next appointment  Diabetes Interventions:  (Status:  Goal on track:  Yes.) Short Term Goal 07/03/23 Assessed patient's understanding of A1c and provided education as well as suggesting getting high/low parameters at PCP appointment  07/03/23 Provided education to patient about basic DM disease process 07/03/23  Reviewed medications with patient and discussed importance of medication adherence 07/03/23 Discussed plans with patient for ongoing care management follow up and provided patient with direct contact information for care management team 07/11/23 Assessed if patient received new glucometer (patient states she did) and states [facility] nurse checked sugar today and reports 113 Lab Results  Component Value Date   HGBA1C 5.9 (H) 03/08/2023    Patient Self Care Activities:  Attend all scheduled provider appointments Call pharmacy for medication refills 3-7 days in advance of running out of medications Call provider office for new concerns or questions  Notify RN Care Manager of TOC call rescheduling needs Participate in Transition of Care Program/Attend TOC scheduled calls Take medications as prescribed   Obtain new glucometer  - MET Send message via portal to PCP regarding CXR Notify Neurology office if she does not hear from Home Health that was ordered during 07/10/23 neurology appointment related to falls prior to admission  Plan:  Telephone follow up appointment with care management team member scheduled for:  07/18/23 11am Plan for excision Mass, breast -non-cancerous reported by patient         Patient verbalizes understanding of instructions and care plan provided today and agrees to view in MyChart. Active MyChart status and patient understanding of how to access instructions and care plan via MyChart confirmed with patient.     Telephone follow up appointment with care management team member scheduled for: 07/18/23 11am The patient has been provided with contact information for  the care management team and has been advised to call with any health related questions or concerns.   Please call the care guide team at [phone] if you need to cancel or reschedule your appointment.   Please call the Suicide and Crisis Lifeline: 988 call the [place]  National Suicide Prevention  Lifeline: 9895822514 or TTY: (215) 563-2813 TTY [phone]) to talk to a trained counselor call 911 if you are experiencing a Mental Health or Behavioral Health Crisis or need someone to talk to.  Tonia Frankel RN, CCM [place]  VBCI-Population Health RN Care Manager [phone]

## 2023-07-11 NOTE — Transitions of Care (Post Inpatient/ED Visit) (Signed)
 Transition of Care week 2  Visit Note  07/11/2023  Name: Hailey Greene MRN: [id number]          DOB: 10/23/1950  Situation: Patient enrolled in [facility] 30-day program. Visit completed with patient by telephone.   Background: TOC RN following with patient related recent hospitalization with discharged 07/02/23 for seizure like activity  Initial Transition Care Management Follow-up Telephone Call    Past Medical History:  Diagnosis Date   2019 novel coronavirus disease (COVID-19) 12/31/2018   Acute hypoxemic respiratory failure due to COVID-19 (HCC)    Chicken pox    Depression    Diabetes (HCC)    GERD (gastroesophageal reflux disease)    Hypertension    Idiopathic progressive neuropathy    Major depressive disorder    Mixed hyperlipidemia    Neuropathy    Osteoarthritis    Pneumonia    Primary insomnia    Primary osteoarthritis of left knee 05/31/2019   RLS (restless legs syndrome)    S/P total knee replacement 07/15/2019   Status post total knee replacement 07/25/2019   Tachycardia    Urge incontinence    UTI (lower urinary tract infection)    Weakness     Assessment: Patient Reported Symptoms: Cognitive        Neurological   Neurological Conditions:  (Patient states this event was the first time she'd had seizure like activity and has not had this again since hospital d/c - patient states neurology did not feel she needed a new medication) Neurological Management Strategies: Medication therapy Neurological Comment: Patient denies any any other neurological issues - unsure if she had a seizure  HEENT HEENT Symptoms Reported: No symptoms reported      Cardiovascular Cardiovascular Symptoms Reported: No symptoms reported Weight: 175 lb (79.4 kg)  Respiratory Respiratory Symptoms Reported: No symptoms reported Respiratory Conditions: Seasonal allergies Respiratory Self-Management Outcome: 4 (good) Respiratory Comment: patient reports she uses nebulizer and inhaler as  prescribed and denied current symptoms  Endocrine Patient reports the following symptoms related to hypoglycemia or hyperglycemia : No symptoms reported Endocrine Self-Management Outcome: 4 (good) Endocrine Comment: patient reports [facility] nurse was present today for AWV and sugar was 113  Gastrointestinal Gastrointestinal Symptoms Reported: No symptoms reported      Genitourinary      Integumentary Integumentary Symptoms Reported: No symptoms reported    Musculoskeletal Musculoskelatal Symptoms Reviewed: No symptoms reported   Falls in the past year?: Yes Number of falls in past year: 2 or more Was there an injury with Fall?: No Fall Risk Category Calculator: 2 Patient Fall Risk Level: Moderate Fall Risk Patient at Risk for Falls Due to: History of fall(s) Fall risk Follow up: Falls prevention discussed, Education provided (Patient confirmed chaning positions slowly)  Psychosocial Psychosocial Symptoms Reported: No symptoms reported         Vitals:   07/10/23 1015  BP: 137/79  Pulse: 73    Medications Reviewed Today     Reviewed by [name], RN (Registered Nurse) on 07/11/23 at 1305  Med List Status: <None>   Medication Order Taking? Sig Documenting Provider Last Dose Status Informant  albuterol (VENTOLIN HFA) 108 (90 Base) MCG/ACT inhaler [id number] Yes Inhale 2 puffs into the lungs every 6 (six) hours as needed for wheezing or shortness of breath. Cox, Kirsten, MD Taking Active   amitriptyline (ELAVIL) 25 MG tablet [id number] Yes Take 1 tablet (25 mg total) by mouth every evening. Blane Ohara, MD Taking Active  ASPIRIN 81 PO [id number] Yes Take 81 mg by mouth daily. [provider] Taking Active   Blood Glucose Monitoring Suppl DEVI [id number] Yes 1 each by Does not apply route daily. E11.9 May substitute to any manufacturer covered by patient's insurance. Mercy Stall, MD Taking Active   Blood Glucose Monitoring Suppl DEVI [id number] Yes 1 each by  Does not apply route in the morning, at noon, and at bedtime. May substitute to any manufacturer covered by patient's insurance. Cox, Kirsten, MD Taking Active   budeson-glycopyrrolate-formoterol (BREZTRI AEROSPHERE) 160-9-4.8 MCG/ACT Sudie Ely [id number] Yes Inhale 2 puffs into the lungs in the morning and at bedtime. Cox, Kirsten, MD Taking Active   Cyanocobalamin (VITAMIN B-12) 2500 MCG SUBL [id number] Yes Place 1 tablet (2,500 mcg total) under the tongue daily. DISSOLVE 1 TABLET UNDER THE TONGUE DAILY Cox, Kirsten, MD Taking Active   dicyclomine (BENTYL) 20 MG tablet [id number] Yes Take 1 tablet (20 mg total) by mouth 4 (four) times daily as needed (intestinal spasms). Mercy Stall, MD Taking Active   Eszopiclone 3 MG TABS [id number] Yes Take 1 tablet (3 mg total) by mouth at bedtime. Cox, Kirsten, MD Taking Active   ezetimibe (ZETIA) 10 MG tablet [id number] Yes Take 1 tablet (10 mg total) by mouth daily. Cox, Kirsten, MD Taking Active   fluticasone Odis Bennetts) 50 MCG/ACT nasal spray [id number] Yes SPRAY 2 SPRAYS INTO EACH NOSTRIL EVERY DAY Cox, Kirsten, MD Taking Active   Glucose Blood (BLOOD GLUCOSE TEST STRIPS) STRP [id number] Yes 1 each by In Vitro route daily. Use new strip when checking blood sugar 1-2 times daily. May substitute to any manufacturer covered by patient's insurance. Cox, Kirsten, MD Taking Active   glucose blood [facility] VERIO) test strip [id number] Yes E11.42 Use new test strip each time when checking FBS Cox, Kirsten, MD Taking Active   hydrOXYzine (VISTARIL) 25 MG capsule [id number] Yes Take 1 capsule (25 mg total) by mouth at bedtime as needed. Cox, Kirsten, MD Taking Active   icosapent Ethyl (VASCEPA) 1 g capsule [id number] Yes TAKE 2 CAPSULES BY MOUTH TWICE  DAILY Cox, Kirsten, MD Taking Active   ipratropium-albuterol (DUONEB) 0.5-2.5 (3) MG/3ML SOLN [id number] Yes Take 3 mLs by nebulization every 6 (six) hours as needed (SOB. WHEEZING). Mercy Stall, MD Taking Active   Lancet Device MISC  [id number] Yes 1 each by Does not apply route daily. May substitute to any manufacturer covered by patient's insurance. Mercy Stall, MD Taking Active   Lancets [facility] Jewelene Morton PLUS [place]) MISC [id number] Yes E 11.42 Use new lancet each time when checking FBS Cox, Kirsten, MD Taking Active   Lancets Misc. MISC [id number] Yes 1 each by Does not apply route daily. May substitute to any manufacturer covered by patient's insurance. Cox, Kirsten, MD Taking Active   loratadine (CLARITIN) 10 MG tablet [id number] Yes Take 1 tablet (10 mg total) by mouth daily. Cox, Kirsten, MD Taking Active   Multiple Vitamin (MULTIVITAMIN WITH MINERALS) TABS tablet [id number] Yes Take 1 tablet by mouth daily. [provider] Taking Active Spouse/Significant Other  pantoprazole (PROTONIX) 40 MG tablet [id number] Yes Take 40 mg by mouth daily. [provider] Taking Active   phentermine (ADIPEX-P) 37.5 MG tablet [id number] Yes Take 1 tablet (37.5 mg total) by mouth every morning. Cox, Kirsten, MD Taking Active   pregabalin (LYRICA) 300 MG capsule [id number] Yes Take 1 capsule (300 mg total) by mouth 2 (two) times daily. Cox, Kirsten, MD Taking Active   ramipril (ALTACE) 2.5 MG capsule [id number] Yes Take 1  capsule (2.5 mg total) by mouth daily. Cox, Kirsten, MD Taking Active   rOPINIRole (REQUIP) 3 MG tablet [id number] Yes Take 1 tablet (3 mg total) by mouth daily 1-3 hours before bedtime. Cox, Kirsten, MD Taking Active   rosuvastatin (CRESTOR) 40 MG tablet [id number] Yes Take 1 tablet (40 mg total) by mouth daily. Cox, Kirsten, MD Taking Active   Semaglutide [facility]) 14 MG TABS [id number] Yes Take 1 tablet (14 mg total) by mouth daily. Cox, Kirsten, MD Taking Active   solifenacin (VESICARE) 5 MG tablet [id number] Yes Take 1 tablet (5 mg total) by mouth daily. Mercy Stall, MD Taking Active   SYMBICORT 160-4.5 MCG/ACT inhaler [id number] Yes Inhale 2 puffs into the lungs 2 (two) times daily. [provider]  Taking Active   topiramate (TOPAMAX) 50 MG tablet [id number] Yes Take 1 tablet (50 mg total) by mouth daily. Cox, Kirsten, MD Taking Active   vortioxetine HBr (TRINTELLIX) 20 MG TABS tablet [id number] Yes Take 1 tablet (20 mg total) by mouth daily. Cox, Kirsten, MD Taking Active             Goals Addressed             This Visit's Progress    VBCI Transitions of Care (TOC) Care Plan       Problems:  Recent Hospitalization for treatment of presented with multiple falls/slurred speech/possible seizure-like activity  Patient reports glucometer is broken and is unable to check sugar - reports PCP office is assisting - 07/11/23 Patient reports PCP office helped and she has new glucometer - states [facility] nurse was present today for AWV and blood sugar was 113   Goal:  Over the next 30 days, the patient will not experience hospital readmission  Interventions:  Transitions of Care:  Goal on track:  Yes. Doctor Visits  - discussed the importance of doctor visits - patient seen by PCP and neurology Educated patient on need for BMP/CBC, 2 view CXR in 1week - 07/11/23 reviewed with patient who confirmed having blood work but no CXR - discussed with patient page 2 of discharge paperwork states CXR 1 week - patient states she will send a message to Dr Reinhold Carbine via portal  Discussed planned Procedure planned 4/17 Dareen Ebbing excision Mass, breast - plans to reschedule and states is non-cancerous - 07/11/23 Patient confirmed this has been rescheduled to 07/27/23 Educated on fall prevention/safety Discussed patient possibly switching from Rybelsus to Ozempic - states she will discuss with PCP at next appointment  Diabetes Interventions:  (Status:  Goal on track:  Yes.) Short Term Goal 07/03/23 Assessed patient's understanding of A1c and provided education as well as suggesting getting high/low parameters at PCP appointment  07/03/23 Provided education to patient about basic DM disease process 07/03/23  Reviewed medications with patient and discussed importance of medication adherence 07/03/23 Discussed plans with patient for ongoing care management follow up and provided patient with direct contact information for care management team 07/11/23 Assessed if patient received new glucometer (patient states she did) and states [facility] nurse checked sugar today and reports 113 Lab Results  Component Value Date   HGBA1C 5.9 (H) 03/08/2023    Patient Self Care Activities:  Attend all scheduled provider appointments Call pharmacy for medication refills 3-7 days in advance of running out of medications Call provider office for new concerns or questions  Notify RN Care Manager of [facility] call rescheduling needs Participate in Transition of Care Program/Attend TOC scheduled calls Take medications  as prescribed   Obtain new glucometer  - MET Send message via portal to PCP regarding CXR Notify Neurology office if she does not hear from Home Health that was ordered during 07/10/23 neurology appointment related to falls prior to admission  Plan:  Telephone follow up appointment with care management team member scheduled for:  07/18/23 11am Plan for excision Mass, breast -non-cancerous reported by patient         Recommendation:   Patient will continue with [facility] RN follow up  Follow Up Plan:   Telephone follow up appointment date/time:  4/222/25 11am  Tonia Frankel RN, CCM [place]  VBCI-Population Health RN Care Manager [phone]

## 2023-07-12 NOTE — Telephone Encounter (Signed)
 [facility] ([place] ) Have made several attempts to contact patient home health PT evaluation. There will be a delay for start of care and if can get a verbal approval to move to tomorrow 4/17. Contact info: [phone]

## 2023-07-12 NOTE — Telephone Encounter (Signed)
 Called and spoke to aira from [facility] and stated that Dr. Samara Crest is agreeable to verbal orders

## 2023-07-13 ENCOUNTER — Encounter

## 2023-07-13 NOTE — Assessment & Plan Note (Addendum)
 At goal. Not on meds. .  Healthy diet.

## 2023-07-13 NOTE — Assessment & Plan Note (Signed)
Recommend continue to work on eating healthy diet and exercise.  °Comorbidities: diabetes, hyperlipidemia °

## 2023-07-13 NOTE — Assessment & Plan Note (Signed)
 Control: good Recommend check sugars fasting daily. Medicines: rybelsus.

## 2023-07-13 NOTE — Assessment & Plan Note (Signed)
 The current medical regimen is effective;  continue present plan and medications. Lyrica 300 mg twice daily.

## 2023-07-18 ENCOUNTER — Telehealth: Payer: Self-pay

## 2023-07-18 NOTE — Transitions of Care (Post Inpatient/ED Visit) (Signed)
 Care Management  Transitions of Care Program Transitions of Care Post-discharge week 3  07/18/2023 Name: Hailey Greene MRN: [id number] DOB: 08-13-1950  Subjective: Hailey Greene is a 73 y.o. year old female who is a primary care patient of Cox, Kirsten, MD. The Care Management team was unable to reach the patient by phone to assess and address transitions of care needs.   Plan: Additional outreach attempts will be made to reach the patient enrolled in the [facility] Program (Post Inpatient/ED Visit).  Tonia Frankel RN, CCM [place]  VBCI-Population Health RN Care Manager [phone]

## 2023-07-18 NOTE — Transitions of Care (Post Inpatient/ED Visit) (Signed)
   07/18/2023  Name: ANAELLE DUNTON MRN: [id number] DOB: May 23, 1950  Today's TOC FU Call Status:    Attempted to reach the patient regarding the most recent Inpatient/ED visit.Patient requested call back this afternoon.   Follow Up Plan: Additional outreach attempts will be made to reach the patient to complete the Transitions of Care (Post Inpatient/ED visit) call.   Tonia Frankel RN, CCM [place]  VBCI-Population Health RN Care Manager [phone]

## 2023-07-19 ENCOUNTER — Telehealth: Payer: Self-pay

## 2023-07-19 NOTE — Transitions of Care (Post Inpatient/ED Visit) (Signed)
 Care Management  Transitions of Care Program Transitions of Care Post-discharge week 3  07/19/2023 Name: Hailey Greene MRN: [id number] DOB: 1950/05/13  Subjective: Hailey Greene is a 73 y.o. year old female who is a primary care patient of Cox, Kirsten, MD. The Care Management team was unable to reach the patient by phone to assess and address transitions of care needs.   Plan: No further outreach attempts will be made at this time.  We have been unable to reach the patient.  Tonia Frankel RN, CCM [place]  VBCI-Population Health RN Care Manager [phone]

## 2023-07-21 ENCOUNTER — Encounter (HOSPITAL_BASED_OUTPATIENT_CLINIC_OR_DEPARTMENT_OTHER): Payer: Self-pay | Admitting: Surgery

## 2023-07-21 ENCOUNTER — Other Ambulatory Visit: Payer: Self-pay | Admitting: Family Medicine

## 2023-07-21 ENCOUNTER — Other Ambulatory Visit: Payer: Self-pay

## 2023-07-21 DIAGNOSIS — J4520 Mild intermittent asthma, uncomplicated: Secondary | ICD-10-CM

## 2023-07-25 ENCOUNTER — Telehealth: Payer: Self-pay | Admitting: Family Medicine

## 2023-07-25 NOTE — Telephone Encounter (Signed)
 [facility] Letter of Medical Necessity

## 2023-07-26 ENCOUNTER — Encounter

## 2023-07-27 ENCOUNTER — Encounter

## 2023-07-27 DIAGNOSIS — Z01818 Encounter for other preprocedural examination: Secondary | ICD-10-CM

## 2023-08-03 ENCOUNTER — Ambulatory Visit: Admitting: Family Medicine

## 2023-08-08 ENCOUNTER — Ambulatory Visit: Payer: Self-pay | Admitting: Family Medicine

## 2023-08-08 DIAGNOSIS — R55 Syncope and collapse: Secondary | ICD-10-CM | POA: Diagnosis not present

## 2023-08-16 ENCOUNTER — Encounter: Payer: Self-pay | Admitting: Family Medicine

## 2023-08-16 ENCOUNTER — Ambulatory Visit: Payer: Self-pay | Admitting: *Deleted

## 2023-08-16 ENCOUNTER — Ambulatory Visit (INDEPENDENT_AMBULATORY_CARE_PROVIDER_SITE_OTHER): Admitting: Family Medicine

## 2023-08-16 VITALS — BP 130/62 | HR 87 | Temp 98.4°F | Resp 16 | Ht 63.0 in | Wt 178.2 lb

## 2023-08-16 DIAGNOSIS — H6122 Impacted cerumen, left ear: Secondary | ICD-10-CM | POA: Diagnosis not present

## 2023-08-16 DIAGNOSIS — J029 Acute pharyngitis, unspecified: Secondary | ICD-10-CM | POA: Insufficient documentation

## 2023-08-16 DIAGNOSIS — Z1152 Encounter for screening for COVID-19: Secondary | ICD-10-CM | POA: Insufficient documentation

## 2023-08-16 DIAGNOSIS — R6889 Other general symptoms and signs: Secondary | ICD-10-CM | POA: Diagnosis not present

## 2023-08-16 DIAGNOSIS — R21 Rash and other nonspecific skin eruption: Secondary | ICD-10-CM | POA: Insufficient documentation

## 2023-08-16 DIAGNOSIS — J020 Streptococcal pharyngitis: Secondary | ICD-10-CM | POA: Diagnosis not present

## 2023-08-16 LAB — POCT INFLUENZA A/B
Influenza A, POC: NEGATIVE
Influenza B, POC: NEGATIVE

## 2023-08-16 LAB — POC COVID19 BINAXNOW: SARS Coronavirus 2 Ag: NEGATIVE

## 2023-08-16 LAB — POCT RAPID STREP A (OFFICE): Rapid Strep A Screen: POSITIVE — AB

## 2023-08-16 MED ORDER — AMOXICILLIN 500 MG PO CAPS: 500.0000 mg | ORAL_CAPSULE | Freq: Two times a day (BID) | ORAL | 0 refills | Status: AC

## 2023-08-16 MED ORDER — DEBROX 6.5 % OT SOLN: 5.0000 [drp] | Freq: Two times a day (BID) | OTIC | 0 refills | Status: DC

## 2023-08-16 NOTE — Telephone Encounter (Signed)
  Chief Complaint: rash Symptoms: rash-large red area- cheeks, below the knee on legs Frequency: started yesterday Pertinent Negatives: Patient denies dizziness, headache, sore throat, joint pain Disposition: [] ED /[] Urgent Care (no appt availability in office) / [x] Appointment(In office/virtual)/ []  [facility]/ [] Home Care/ [] Refused Recommended Disposition /[] [facility]/ []  Follow-up with PCP Additional Notes: Appointment scheduled- over the counter medication os not helping   Copied from CRM #[id number]. Topic: Clinical - Red Word Triage >> Aug 16, 2023  8:51 AM [name] wrote: Face broken out and it's on her legs Started yesterday and it's itching Reason for Disposition  SEVERE itching (i.e., interferes with sleep, normal activities or school)  Answer Assessment - Initial Assessment Questions 1. APPEARANCE of RASH: "Describe the rash." (e.g., spots, blisters, raised areas, skin peeling, scaly)     Red bumps- hives- on both cheeks on face,legs- blotchy 2. SIZE: "How big are the spots?" (e.g., tip of pen, eraser, coin; inches, centimeters)     Large area 3. LOCATION: "Where is the rash located?"     Cheeks of face, legs- below knees 4. COLOR: "What color is the rash?" (Note: It is difficult to assess rash color in people with darker-colored skin. When this situation occurs, simply ask the caller to describe what they see.)     red 5. ONSET: "When did the rash begin?"     Started yetserday 6. FEVER: "Do you have a fever?" If Yes, ask: "What is your temperature, how was it measured, and when did it start?"     no 7. ITCHING: "Does the rash itch?" If Yes, ask: "How bad is the itch?" (Scale 1-10; or mild, moderate, severe)     Itching- moderate 8. CAUSE: "What do you think is causing the rash?"     unsure 9. MEDICINE FACTORS: "Have you started any new medicines within the last 2 weeks?" (e.g., antibiotics)      no 10. OTHER SYMPTOMS: "Do you have any other  symptoms?" (e.g., dizziness, headache, sore throat, joint pain)       no  Protocols used: Rash or Redness - [facility]

## 2023-08-16 NOTE — Assessment & Plan Note (Addendum)
 Symptoms of nausea and fatigue, R/O flu and Covid Flu and Covid - negative

## 2023-08-16 NOTE — Assessment & Plan Note (Signed)
 Strep test - positive - Try honey and lemon for sore throat - Gargle with warm salt water - Try hot tea to soothe the throat

## 2023-08-16 NOTE — Assessment & Plan Note (Signed)
 Found on exam - cerumen buildup. No symptoms of hearing loss or dizziness - Recommend DeBrox solution TWICE A DAY for 2 weeks

## 2023-08-16 NOTE — Assessment & Plan Note (Signed)
 Symptoms consistent with previous Covid 19 infection Covid - negative

## 2023-08-16 NOTE — Assessment & Plan Note (Signed)
 Confirmed strep throat with rash. Positive strep test. Differential diagnosis considered, strep confirmed. - Prescribed amoxicillin  500 mg BID for 10 days. - Monitor for allergic reaction to amoxicillin . - Advised to report worsening or new symptoms. - Discussed potential side effects of amoxicillin , including GI upset. - Monitor nausea, advised to report if worsens or new symptoms develop.

## 2023-08-16 NOTE — Progress Notes (Signed)
 Acute Office Visit  Subjective:    Patient ID: Hailey Greene, female    DOB: 1950/03/30, 73 y.o.   MRN: [id number]  Chief Complaint  Patient presents with   Rash   Discussed the use of AI scribe software for clinical note transcription with the patient, who gave verbal consent to proceed.  History of Present Illness   Hailey Greene is a 73 year old female who presents with a new rash times 2 days and nausea.  She has developed blotchy rashes on her arms and legs. She has been using a cream prescribed for a previous rash, but it has not been effective for the itching. There have been no changes in medications, diet, or use of new body lotions, body wash, or detergents.  She is experiencing nausea and has felt nauseated recently. No sore throat at present, but she mentioned having a sore throat a few days ago. She has had COVID several times in the past and was admitted to the hospital with similar symptoms in March.  No difficulty breathing, chest pain, vomiting, coughing, eye itching, or sneezing. No loss of smell or taste. She has not been taking her temperature but notes that she usually experiences fever in the afternoon if it occurs.  She is concerned about whether her current symptoms will affect an upcoming procedure to remove a spot found on her right breast during a mammogram.    Past Medical History:  Diagnosis Date   2019 novel coronavirus disease (COVID-19) 12/31/2018   Acute hypoxemic respiratory failure due to COVID-19 [facility])    Asthma    Chicken pox    Depression    Diabetes (HCC)    GERD (gastroesophageal reflux disease)    Hypertension    Idiopathic progressive neuropathy    Major depressive disorder    Mixed hyperlipidemia    Neuromuscular disorder (HCC)    peripheral neuropathy   Neuropathy    Osteoarthritis    Pneumonia    Primary insomnia    Primary osteoarthritis of left knee 05/31/2019   RLS (restless legs syndrome)    S/P total knee  replacement 07/15/2019   Sleep apnea    does not use CPAP   Status post total knee replacement 07/25/2019   Tachycardia    Urge incontinence    UTI (lower urinary tract infection)    Weakness     Past Surgical History:  Procedure Laterality Date   ABDOMINAL HYSTERECTOMY     APPENDECTOMY     BREAST BIOPSY Right 04/26/2023   US  RT BREAST BX W LOC DEV 1ST LESION IMG BX SPEC US  GUIDE 04/26/2023 [facility]   CHOLECYSTECTOMY     HERNIA REPAIR     REPLACEMENT TOTAL KNEE Right    TONSILLECTOMY     TOTAL KNEE ARTHROPLASTY Left 07/15/2019   Procedure: TOTAL KNEE ARTHROPLASTY;  Surgeon: Christie Cox, MD;  Location: [facility];  Service: Orthopedics;  Laterality: Left;    Family History  Problem Relation Age of Onset   Thyroid  disease Mother    Cancer Mother    Migraines Mother    Brain cancer Father    Heart disease Maternal Grandmother    Diabetes Daughter     Social History   Socioeconomic History   Marital status: Married    Spouse name: Not on file   Number of children: 3   Years of education: 12   Highest education level: Not on file  Occupational History   Occupation: Housewife  Tobacco Use   Smoking status: Former    Current packs/day: 0.00    Average packs/day: 2.0 packs/day for 12.0 years (24.0 ttl pk-yrs)    Types: Cigarettes    Start date: 05/25/1998    Quit date: 05/25/2010    Years since quitting: 13.2   Smokeless tobacco: Never  Vaping Use   Vaping status: Never Used  Substance and Sexual Activity   Alcohol use: Yes    Comment: occasional   Drug use: No   Sexual activity: Not Currently    Birth control/protection: Surgical    Comment: hyst  Other Topics Concern   Not on file  Social History Narrative   Born and raised in [place], [place]. Currently lives in a house with her husband. 1 dog. Fun: Garden, feed birds, swimming   Denies any religious beliefs effecting health care.    Social Drivers of Corporate investment banker Strain: Low Risk   (03/08/2023)   Overall Financial Resource Strain (CARDIA)    Difficulty of Paying Living Expenses: Not hard at all  Food Insecurity: No Food Insecurity (07/03/2023)   Hunger Vital Sign    Worried About Running Out of Food in the Last Year: Never true    Ran Out of Food in the Last Year: Never true  Transportation Needs: No Transportation Needs (07/03/2023)   PRAPARE - Administrator, Civil Service (Medical): No    Lack of Transportation (Non-Medical): No  Physical Activity: Insufficiently Active (03/08/2023)   Exercise Vital Sign    Days of Exercise per Week: 7 days    Minutes of Exercise per Session: 20 min  Stress: No Stress Concern Present (03/08/2023)   [name] of Occupational Health - Occupational Stress Questionnaire    Feeling of Stress : Not at all  Social Connections: Moderately Integrated (07/05/2023)   Social Connection and Isolation Panel [NHANES]    Frequency of Communication with Friends and Family: More than three times a week    Frequency of Social Gatherings with Friends and Family: More than three times a week    Attends Religious Services: More than 4 times per year    Active Member of [employer] or Organizations: No    Attends Banker Meetings: Never    Marital Status: Married  Catering manager Violence: Not At Risk (07/03/2023)   Humiliation, Afraid, Rape, and Kick questionnaire    Fear of Current or Ex-Partner: No    Emotionally Abused: No    Physically Abused: No    Sexually Abused: No    Outpatient Medications Prior to Visit  Medication Sig Dispense Refill   albuterol  (VENTOLIN  HFA) 108 (90 Base) MCG/ACT inhaler Inhale 2 puffs into the lungs every 6 (six) hours as needed for wheezing or shortness of breath. 8 g 2   amitriptyline  (ELAVIL ) 25 MG tablet Take 1 tablet (25 mg total) by mouth every evening. 90 tablet 1   ASPIRIN  81 PO Take 81 mg by mouth daily.     Blood Glucose Monitoring Suppl DEVI 1 each by Does not apply route daily.  E11.9 May substitute to any manufacturer covered by patient's insurance. 1 each 0   Blood Glucose Monitoring Suppl DEVI 1 each by Does not apply route in the morning, at noon, and at bedtime. May substitute to any manufacturer covered by patient's insurance. 1 each 0   budeson-glycopyrrolate-formoterol  (BREZTRI  AEROSPHERE) 160-9-4.8 MCG/ACT AERO Inhale 2 puffs into the lungs in the morning and at bedtime.  Cyanocobalamin  (VITAMIN B-12) 2500 MCG SUBL Place 1 tablet (2,500 mcg total) under the tongue daily. DISSOLVE 1 TABLET UNDER THE TONGUE DAILY 90 tablet 3   dicyclomine  (BENTYL ) 20 MG tablet Take 1 tablet (20 mg total) by mouth 4 (four) times daily as needed (intestinal spasms). 120 tablet 1   Eszopiclone  3 MG TABS Take 1 tablet (3 mg total) by mouth at bedtime. 30 tablet 2   ezetimibe  (ZETIA ) 10 MG tablet Take 1 tablet (10 mg total) by mouth daily. 90 tablet 0   fluticasone  (FLONASE ) 50 MCG/ACT nasal spray SPRAY 2 SPRAYS INTO EACH NOSTRIL EVERY DAY 48 g 2   glucose blood (ONETOUCH VERIO) test strip E11.42 Use new test strip each time when checking FBS 100 each 2   hydrOXYzine  (VISTARIL ) 25 MG capsule Take 1 capsule (25 mg total) by mouth at bedtime as needed. 90 capsule 0   icosapent  Ethyl (VASCEPA ) 1 g capsule TAKE 2 CAPSULES BY MOUTH TWICE  DAILY 120 capsule 11   ipratropium-albuterol  (DUONEB) 0.5-2.5 (3) MG/3ML SOLN Take 3 mLs by nebulization every 6 (six) hours as needed (SOB. WHEEZING). 360 mL 1   Lancets (ONETOUCH DELICA PLUS LANCET33G) MISC E 11.42 Use new lancet each time when checking FBS 100 each 2   loratadine  (CLARITIN ) 10 MG tablet Take 1 tablet (10 mg total) by mouth daily. 90 tablet 3   Multiple Vitamin (MULTIVITAMIN WITH MINERALS) TABS tablet Take 1 tablet by mouth daily.     omeprazole  (PRILOSEC) 20 MG capsule Take 20 mg by mouth daily.     pantoprazole  (PROTONIX ) 40 MG tablet Take 40 mg by mouth daily.     phentermine  (ADIPEX-P ) 37.5 MG tablet Take 1 tablet (37.5 mg total) by  mouth every morning. 30 tablet 0   pregabalin  (LYRICA ) 300 MG capsule Take 1 capsule (300 mg total) by mouth 2 (two) times daily. 60 capsule 2   ramipril  (ALTACE ) 2.5 MG capsule Take 1 capsule (2.5 mg total) by mouth daily. 90 capsule 0   rOPINIRole  (REQUIP ) 3 MG tablet Take 1 tablet (3 mg total) by mouth daily 1-3 hours before bedtime. 90 tablet 0   rosuvastatin  (CRESTOR ) 40 MG tablet Take 1 tablet (40 mg total) by mouth daily. 90 tablet 0   Semaglutide  (RYBELSUS ) 14 MG TABS Take 1 tablet (14 mg total) by mouth daily. 90 tablet 0   solifenacin  (VESICARE ) 5 MG tablet Take 1 tablet (5 mg total) by mouth daily. 90 tablet 0   SYMBICORT  160-4.5 MCG/ACT inhaler INHALE 2 PUFFS INTO THE LUNGS TWICE A DAY 10.2 each 3   topiramate  (TOPAMAX ) 50 MG tablet Take 1 tablet (50 mg total) by mouth daily. 90 tablet 0   vortioxetine  HBr (TRINTELLIX ) 20 MG TABS tablet Take 1 tablet (20 mg total) by mouth daily. 90 tablet 0   No facility-administered medications prior to visit.    No Known Allergies  Review of Systems  Constitutional:  Positive for fatigue. Negative for chills, diaphoresis and fever.  HENT:  Positive for sore throat. Negative for congestion, ear pain and sinus pain.   Eyes: Negative.   Respiratory:  Negative for cough and shortness of breath.   Cardiovascular:  Negative for chest pain.  Gastrointestinal:  Positive for nausea. Negative for abdominal pain, constipation and vomiting.  Endocrine: Negative.   Genitourinary:  Negative for dysuria.  Musculoskeletal:  Negative for arthralgias.  Skin:  Positive for rash.  Allergic/Immunologic: Negative.   Neurological:  Negative for weakness and headaches.  Hematological: Negative.  Psychiatric/Behavioral:  Negative for dysphoric mood. The patient is not nervous/anxious.        Objective:         08/16/2023    9:48 AM 07/21/2023   11:10 AM 07/10/2023   10:15 AM  Vitals with BMI  Height 5\' 3"  5\' 3"    Weight 178 lbs 3 oz 175 lbs 1 oz 175  lbs  BMI 31.57 31.02 31.01  Systolic 130  137  Diastolic 62  79  Pulse 87  73    No data found.   Physical Exam Vitals reviewed.  Constitutional:      General: She is not in acute distress.    Appearance: Normal appearance. She is ill-appearing.  HENT:     Right Ear: Tympanic membrane normal.     Left Ear: There is impacted cerumen.     Mouth/Throat:     Pharynx: Posterior oropharyngeal erythema present.  Eyes:     Conjunctiva/sclera: Conjunctivae normal.  Cardiovascular:     Rate and Rhythm: Normal rate and regular rhythm.     Heart sounds: Normal heart sounds. No murmur heard. Pulmonary:     Effort: Pulmonary effort is normal.     Breath sounds: Normal breath sounds. No wheezing.  Musculoskeletal:        General: Normal range of motion.  Skin:    General: Skin is warm.  Neurological:     Mental Status: She is alert. Mental status is at baseline.  Psychiatric:        Mood and Affect: Mood normal.        Behavior: Behavior normal.     Health Maintenance Due  Topic Date Due   Lung Cancer Screening  06/30/2020   Medicare Annual Wellness (AWV)  03/11/2022   OPHTHALMOLOGY EXAM  08/06/2022   COVID-19 Vaccine (7 - Pfizer risk 2024-25 season) 07/03/2023   Diabetic kidney evaluation - Urine ACR  07/29/2023    There are no preventive care reminders to display for this patient.   Lab Results  Component Value Date   TSH 0.694 07/02/2023   Lab Results  Component Value Date   WBC 9.6 07/05/2023   HGB 14.8 07/05/2023   HCT 45.7 07/05/2023   MCV 91 07/05/2023   PLT 271 07/05/2023   Lab Results  Component Value Date   NA 138 07/05/2023   K 5.2 07/05/2023   CO2 21 07/05/2023   GLUCOSE 92 07/05/2023   BUN 10 07/05/2023   CREATININE 0.69 07/05/2023   BILITOT 0.4 07/05/2023   ALKPHOS 84 07/05/2023   AST 26 07/05/2023   ALT 20 07/05/2023   PROT 6.4 07/05/2023   ALBUMIN 4.2 07/05/2023   CALCIUM  9.7 07/05/2023   ANIONGAP 10 07/02/2023   EGFR 92 07/05/2023    Lab Results  Component Value Date   CHOL 158 03/08/2023   Lab Results  Component Value Date   HDL 62 03/08/2023   Lab Results  Component Value Date   LDLCALC 59 03/08/2023   Lab Results  Component Value Date   TRIG 234 (H) 03/08/2023   Lab Results  Component Value Date   CHOLHDL 2.5 03/08/2023   Lab Results  Component Value Date   HGBA1C 5.9 (H) 03/08/2023       Assessment & Plan:  Streptococcal pharyngitis Assessment & Plan: Confirmed strep throat with rash. Positive strep test. Differential diagnosis considered, strep confirmed. - Prescribed amoxicillin  500 mg BID for 10 days. - Monitor for allergic reaction to amoxicillin . - Advised to  report worsening or new symptoms. - Discussed potential side effects of amoxicillin , including GI upset. - Monitor nausea, advised to report if worsens or new symptoms develop.   Orders: -     Amoxicillin ; Take 1 capsule (500 mg total) by mouth 2 (two) times daily for 10 days.  Dispense: 20 capsule; Refill: 0  Cerumen debris on tympanic membrane of left ear Assessment & Plan: Found on exam - cerumen buildup. No symptoms of hearing loss or dizziness - Recommend DeBrox solution TWICE A DAY for 2 weeks  Orders: -     Debrox; Place 5 drops into the left ear 2 (two) times daily.  Dispense: 15 mL; Refill: 0  Flu-like symptoms Assessment & Plan: Symptoms of nausea and fatigue, R/O flu and Covid Flu and Covid - negative  Orders: -     POC COVID-19 BinaxNow -     POCT Influenza A/B  Sore throat Assessment & Plan: Strep test - positive - Try honey and lemon for sore throat - Gargle with warm salt water  - Try hot tea to soothe the throat   Orders: -     POCT rapid strep A  Rash Assessment & Plan: New rash that developed 2 days ago. Steroid cream that was rx for another rash did not help the itching. No new detergents, body wash or lotions.  - Strep test - positive  Orders: -     POCT rapid strep A  Encounter for  screening for COVID-19 Assessment & Plan: Symptoms consistent with previous Covid 19 infection Covid - negative  Orders: -     POC COVID-19 BinaxNow    Meds ordered this encounter  Medications   carbamide peroxide (DEBROX) 6.5 % OTIC solution    Sig: Place 5 drops into the left ear 2 (two) times daily.    Dispense:  15 mL    Refill:  0   amoxicillin  (AMOXIL ) 500 MG capsule    Sig: Take 1 capsule (500 mg total) by mouth 2 (two) times daily for 10 days.    Dispense:  20 capsule    Refill:  0    Orders Placed This Encounter  Procedures   Rapid Strep A   POC COVID-19   POCT Influenza A/B     Follow-up: Return if symptoms worsen or fail to improve.  An After Visit Summary was printed and given to the patient.  Delford Felling, FNP [facility] [phone]

## 2023-08-16 NOTE — Assessment & Plan Note (Addendum)
 New rash that developed 2 days ago. Steroid cream that was rx for another rash did not help the itching. No new detergents, body wash or lotions.  - Strep test - positive

## 2023-08-17 ENCOUNTER — Other Ambulatory Visit: Payer: Self-pay | Admitting: Family Medicine

## 2023-08-17 DIAGNOSIS — R110 Nausea: Secondary | ICD-10-CM

## 2023-08-18 ENCOUNTER — Inpatient Hospital Stay: Admission: RE | Admit: 2023-08-18 | Source: Ambulatory Visit

## 2023-08-22 ENCOUNTER — Encounter

## 2023-08-22 DIAGNOSIS — Z01818 Encounter for other preprocedural examination: Secondary | ICD-10-CM

## 2023-08-23 ENCOUNTER — Other Ambulatory Visit: Payer: Self-pay | Admitting: Family Medicine

## 2023-08-23 ENCOUNTER — Ambulatory Visit (INDEPENDENT_AMBULATORY_CARE_PROVIDER_SITE_OTHER): Admitting: Family Medicine

## 2023-08-23 ENCOUNTER — Encounter: Payer: Self-pay | Admitting: Family Medicine

## 2023-08-23 VITALS — BP 106/60 | HR 73 | Temp 97.5°F | Ht 63.0 in | Wt 172.0 lb

## 2023-08-23 DIAGNOSIS — Z0000 Encounter for general adult medical examination without abnormal findings: Secondary | ICD-10-CM | POA: Diagnosis not present

## 2023-08-23 DIAGNOSIS — E782 Mixed hyperlipidemia: Secondary | ICD-10-CM

## 2023-08-23 DIAGNOSIS — I10 Essential (primary) hypertension: Secondary | ICD-10-CM

## 2023-08-23 DIAGNOSIS — E1142 Type 2 diabetes mellitus with diabetic polyneuropathy: Secondary | ICD-10-CM | POA: Diagnosis not present

## 2023-08-23 DIAGNOSIS — F330 Major depressive disorder, recurrent, mild: Secondary | ICD-10-CM

## 2023-08-23 DIAGNOSIS — G603 Idiopathic progressive neuropathy: Secondary | ICD-10-CM

## 2023-08-23 DIAGNOSIS — J020 Streptococcal pharyngitis: Secondary | ICD-10-CM

## 2023-08-23 DIAGNOSIS — J4551 Severe persistent asthma with (acute) exacerbation: Secondary | ICD-10-CM

## 2023-08-23 DIAGNOSIS — K219 Gastro-esophageal reflux disease without esophagitis: Secondary | ICD-10-CM

## 2023-08-23 MED ORDER — PREGABALIN 300 MG PO CAPS: 300.0000 mg | ORAL_CAPSULE | Freq: Two times a day (BID) | ORAL | 2 refills | Status: AC

## 2023-08-23 MED ORDER — DICYCLOMINE HCL 20 MG PO TABS: 20.0000 mg | ORAL_TABLET | Freq: Four times a day (QID) | ORAL | 1 refills | Status: DC | PRN

## 2023-08-23 MED ORDER — SEMAGLUTIDE (2 MG/DOSE) 8 MG/3ML ~~LOC~~ SOPN: 2.0000 mg | PEN_INJECTOR | SUBCUTANEOUS | 2 refills | Status: DC

## 2023-08-23 MED ORDER — AZITHROMYCIN 250 MG PO TABS: ORAL_TABLET | ORAL | 0 refills | Status: AC

## 2023-08-23 MED ORDER — AIRSUPRA 90-80 MCG/ACT IN AERO: 2.0000 | INHALATION_SPRAY | Freq: Four times a day (QID) | RESPIRATORY_TRACT | 3 refills | Status: DC | PRN

## 2023-08-23 NOTE — Patient Instructions (Addendum)
 Things to do to keep yourself healthy  - Exercise at least 30-45 minutes a day, 3-4 days a week.  - Eat a low-fat diet with lots of fruits and vegetables, up to 7-9 servings per day.  - Seatbelts can save your life. Wear them always.  - Smoke detectors on every level of your home, check batteries every year.  - Eye Doctor - have an eye exam every 1-2 years  - Safe sex - if you may be exposed to STDs, use a condom.  - Alcohol -  If you drink, do it moderately, less than 2 drinks per day.  - Health Care Power of Attorney. Choose someone to speak for you if you are not able.  - Depression is common in our stressful world.If you're feeling down or losing interest in things you normally enjoy, please come in for a visit.  - Violence - If anyone is threatening or hurting you, please call immediately.  Strep throat: zithromax  sent.   Diabetes: Stop rybelsus . Start ozempic  2 mg weekly.   Asthma/bronchitis: Use breztri  2 puffs twice daily. Use Airsupra  2 puffs four times a day as needed wheezing/shortness of breath.  Resources: [facility] [place] [phone] [address] (mailing address) [place], [place] [address] [email or web address]  [employer] [facility] [phone] [address] [place], [place] [address] [email or web address] offers short-term financial crisis intervention, food assistance, transitional housing, medical and medication assistance and medical equipment for those in need.

## 2023-08-23 NOTE — Progress Notes (Signed)
 Subjective:   [name] is a 73 y.o. female who presents for Medicare Annual (Subsequent) preventive examination.  Visit Complete: In person  Patient Medicare AWV questionnaire was completed by the patient; I have confirmed that all information answered by patient is correct and no changes since this date.  Seen last week for strep throat. Taking amoxicillin . May be a little better.   Diabetes:  Checking sugars 110-135. Check feet daily.  On rybelsus  14 mg daily.   COPD: Breztri  2 puffs twice daily. Airsupra 2 puffs four times a day as needed.     Objective:    Today's Vitals   08/23/23 1018 08/23/23 1025  BP: 106/60   Pulse: 73   Temp: (!) 97.5 F (36.4 C)   SpO2: 98%   Weight: 172 lb (78 kg)   Height: 5\' 3"  (1.6 m)   PainSc:  0-No pain   Body mass index is 30.47 kg/m.     08/23/2023   10:24 AM 07/21/2023   11:17 AM 07/01/2023    6:15 AM 02/13/2023    4:18 PM 03/10/2020    9:44 AM 08/09/2019    8:23 AM 08/08/2019    1:50 PM  Advanced Directives  Does Patient Have a Medical Advance Directive? Yes Yes No No Yes No No  Type of Sales promotion account executive of Attorney       Copy of Healthcare Power of Attorney in Chart? No - copy requested        Would patient like information on creating a medical advance directive?   Yes (Inpatient - patient requests chaplain consult to create a medical advance directive)   No - Patient declined     Current Medications (verified) Outpatient Encounter Medications as of 08/23/2023  Medication Sig   albuterol  (VENTOLIN  HFA) 108 (90 Base) MCG/ACT inhaler Inhale 2 puffs into the lungs every 6 (six) hours as needed for wheezing or shortness of breath.   amitriptyline  (ELAVIL ) 25 MG tablet Take 1 tablet (25 mg total) by mouth every evening.   amoxicillin  (AMOXIL ) 500 MG capsule Take 1 capsule (500 mg total) by mouth 2 (two) times daily for 10 days.   ASPIRIN  81 PO Take 81 mg by mouth daily.   Blood  Glucose Monitoring Suppl DEVI 1 each by Does not apply route daily. E11.9 May substitute to any manufacturer covered by patient's insurance.   Blood Glucose Monitoring Suppl DEVI 1 each by Does not apply route in the morning, at noon, and at bedtime. May substitute to any manufacturer covered by patient's insurance.   budeson-glycopyrrolate-formoterol  (BREZTRI  AEROSPHERE) 160-9-4.8 MCG/ACT AERO Inhale 2 puffs into the lungs in the morning and at bedtime.   carbamide peroxide (DEBROX) 6.5 % OTIC solution Place 5 drops into the left ear 2 (two) times daily.   Cyanocobalamin  (VITAMIN B-12) 2500 MCG SUBL Place 1 tablet (2,500 mcg total) under the tongue daily. DISSOLVE 1 TABLET UNDER THE TONGUE DAILY   dicyclomine  (BENTYL ) 20 MG tablet Take 1 tablet (20 mg total) by mouth 4 (four) times daily as needed (intestinal spasms).   Eszopiclone  3 MG TABS Take 1 tablet (3 mg total) by mouth at bedtime.   ezetimibe  (ZETIA ) 10 MG tablet Take 1 tablet (10 mg total) by mouth daily.   fluticasone  (FLONASE ) 50 MCG/ACT nasal spray SPRAY 2 SPRAYS INTO EACH NOSTRIL EVERY DAY   glucose blood (ONETOUCH VERIO) test strip E11.42 Use new test strip each time when checking FBS  hydrOXYzine  (VISTARIL ) 25 MG capsule Take 1 capsule (25 mg total) by mouth at bedtime as needed.   icosapent  Ethyl (VASCEPA ) 1 g capsule TAKE 2 CAPSULES BY MOUTH TWICE  DAILY   ipratropium-albuterol  (DUONEB) 0.5-2.5 (3) MG/3ML SOLN Take 3 mLs by nebulization every 6 (six) hours as needed (SOB. WHEEZING).   Lancets (ONETOUCH DELICA PLUS LANCET33G) MISC E 11.42 Use new lancet each time when checking FBS   loratadine  (CLARITIN ) 10 MG tablet Take 1 tablet (10 mg total) by mouth daily.   Multiple Vitamin (MULTIVITAMIN WITH MINERALS) TABS tablet Take 1 tablet by mouth daily.   omeprazole  (PRILOSEC) 20 MG capsule Take 20 mg by mouth daily.   pantoprazole  (PROTONIX ) 40 MG tablet Take 40 mg by mouth daily.   phentermine  (ADIPEX-P ) 37.5 MG tablet Take 1 tablet  (37.5 mg total) by mouth every morning.   pregabalin  (LYRICA ) 300 MG capsule Take 1 capsule (300 mg total) by mouth 2 (two) times daily.   ramipril  (ALTACE ) 2.5 MG capsule Take 1 capsule (2.5 mg total) by mouth daily.   rOPINIRole  (REQUIP ) 3 MG tablet Take 1 tablet (3 mg total) by mouth daily 1-3 hours before bedtime.   rosuvastatin  (CRESTOR ) 40 MG tablet Take 1 tablet (40 mg total) by mouth daily.   Semaglutide  (RYBELSUS ) 14 MG TABS Take 1 tablet (14 mg total) by mouth daily.   solifenacin  (VESICARE ) 5 MG tablet Take 1 tablet (5 mg total) by mouth daily.   SYMBICORT  160-4.5 MCG/ACT inhaler INHALE 2 PUFFS INTO THE LUNGS TWICE A DAY   topiramate  (TOPAMAX ) 50 MG tablet Take 1 tablet (50 mg total) by mouth daily.   vortioxetine  HBr (TRINTELLIX ) 20 MG TABS tablet Take 1 tablet (20 mg total) by mouth daily.   No facility-administered encounter medications on file as of 08/23/2023.    Allergies (verified) Patient has no known allergies.   History: Past Medical History:  Diagnosis Date   2019 novel coronavirus disease (COVID-19) 12/31/2018   Acute hypoxemic respiratory failure due to COVID-19 [facility])    Asthma    Chicken pox    Depression    Diabetes (HCC)    GERD (gastroesophageal reflux disease)    Hypertension    Idiopathic progressive neuropathy    Major depressive disorder    Mixed hyperlipidemia    Neuromuscular disorder (HCC)    peripheral neuropathy   Neuropathy    Osteoarthritis    Pneumonia    Primary insomnia    Primary osteoarthritis of left knee 05/31/2019   RLS (restless legs syndrome)    S/P total knee replacement 07/15/2019   Sleep apnea    does not use CPAP   Status post total knee replacement 07/25/2019   Tachycardia    Urge incontinence    UTI (lower urinary tract infection)    Weakness    Past Surgical History:  Procedure Laterality Date   ABDOMINAL HYSTERECTOMY     APPENDECTOMY     BREAST BIOPSY Right 04/26/2023   US  RT BREAST BX W LOC DEV 1ST LESION IMG  BX SPEC US  GUIDE 04/26/2023 [facility]MO[facility]   CHOLECYSTECTOMY     HERNIA REPAIR     REPLACEMENT TOTAL KNEE Right    TONSILLECTOMY     TOTAL KNEE ARTHROPLASTY Left 07/15/2019   Procedure: TOTAL KNEE ARTHROPLASTY;  Surgeon: Christie Cox, MD;  Location: [facility];  Service: Orthopedics;  Laterality: Left;   Family History  Problem Relation Age of Onset   Thyroid  disease Mother    Cancer Mother  Migraines Mother    Brain cancer Father    Heart disease Maternal Grandmother    Diabetes Daughter    Social History   Socioeconomic History   Marital status: Married    Spouse name: Not on file   Number of children: 3   Years of education: 12   Highest education level: Not on file  Occupational History   Occupation: Housewife  Tobacco Use   Smoking status: Former    Current packs/day: 0.00    Average packs/day: 2.0 packs/day for 12.0 years (24.0 ttl pk-yrs)    Types: Cigarettes    Start date: 05/25/1998    Quit date: 05/25/2010    Years since quitting: 13.2   Smokeless tobacco: Never  Vaping Use   Vaping status: Never Used  Substance and Sexual Activity   Alcohol use: Yes    Comment: occasional   Drug use: No   Sexual activity: Not Currently    Birth control/protection: Surgical    Comment: hyst  Other Topics Concern   Not on file  Social History Narrative   Born and raised in [place], [place]. Currently lives in a house with her husband. 1 dog. Fun: Garden, feed birds, swimming   Denies any religious beliefs effecting health care.    Social Drivers of Health   Financial Resource Strain: Medium Risk (08/19/2023)   Overall Financial Resource Strain (CARDIA)    Difficulty of Paying Living Expenses: Somewhat hard  Food Insecurity: Food Insecurity Present (08/19/2023)   Hunger Vital Sign    Worried About Running Out of Food in the Last Year: Sometimes true    Ran Out of Food in the Last Year: Sometimes true  Transportation Needs: No Transportation Needs (08/19/2023)   PRAPARE -  Administrator, Civil Service (Medical): No    Lack of Transportation (Non-Medical): No  Physical Activity: Insufficiently Active (08/19/2023)   Exercise Vital Sign    Days of Exercise per Week: 4 days    Minutes of Exercise per Session: 20 min  Stress: Stress Concern Present (08/19/2023)   [name] of Occupational Health - Occupational Stress Questionnaire    Feeling of Stress : Rather much  Social Connections: Socially Integrated (08/19/2023)   Social Connection and Isolation Panel [NHANES]    Frequency of Communication with Friends and Family: More than three times a week    Frequency of Social Gatherings with Friends and Family: Three times a week    Attends Religious Services: More than 4 times per year    Active Member of Clubs or Organizations: Yes    Attends Engineer, structural: More than 4 times per year    Marital Status: Married    Tobacco Counseling Counseling given: Not Answered   Clinical Intake:  Pre-visit preparation completed: No  Pain : No/denies pain Pain Score: 0-No pain     Nutritional Status: BMI > 30  Obese Diabetes: Yes CBG done?: No Did pt. bring in CBG monitor from home?: No  How often do you need to have someone help you when you read instructions, pamphlets, or other written materials from your doctor or pharmacy?: 1 - Never  Interpreter Needed?: No      Activities of Daily Living    07/01/2023    6:15 AM  In your present state of health, do you have any difficulty performing the following activities:  Hearing? 0  Vision? 0  Difficulty concentrating or making decisions? 0  Doing errands, shopping? 0  Patient Care Team: Mercy Stall, MD as PCP - General (Family Medicine) Christie Cox, MD as Consulting Physician (Orthopedic Surgery)  Indicate any recent Medical Services you may have received from other than [facility] providers in the past year (date may be approximate).     Assessment:   This is a routine  wellness examination for Hailey Greene.  Hearing/Vision screen No results found.   Goals Addressed             This Visit's Progress    Weight (lb) < 200 lb (90.7 kg)   172 lb (78 kg)     Depression Screen    08/23/2023   10:25 AM 07/11/2023    1:25 PM 04/27/2023    8:17 AM 03/08/2023    8:51 AM 11/16/2022    8:58 AM 07/29/2022    9:16 AM 03/02/2022    3:45 PM  PHQ 2/9 Scores  PHQ - 2 Score 0 0 0 0 1 1 0  PHQ- 9 Score   3 0 9 1 1     Fall Risk    08/23/2023   10:25 AM 07/11/2023    1:21 PM 04/27/2023    8:17 AM 11/16/2022    8:57 AM 07/29/2022    9:15 AM  Fall Risk   Falls in the past year? 1 1 1 1 1   Number falls in past yr: 1 1 1 1  0  Injury with Fall? 0 0 1 1 0  Risk for fall due to : History of fall(s) History of fall(s) History of fall(s) Impaired balance/gait Impaired mobility  Follow up Falls evaluation completed Falls prevention discussed;Education provided  Falls evaluation completed;Falls prevention discussed Falls evaluation completed;Falls prevention discussed  Comment  Patient confirmed chaning positions slowly       MEDICARE RISK AT HOME: Medicare Risk at Home Any stairs in or around the home?: Yes If so, are there any without handrails?: Yes Home free of loose throw rugs in walkways, pet beds, electrical cords, etc?: Yes Adequate lighting in your home to reduce risk of falls?: Yes Life alert?: Yes (Not activated) Use of a cane, walker or w/c?: Yes Grab bars in the bathroom?: Yes Shower chair or bench in shower?: Yes Elevated toilet seat or a handicapped toilet?: Yes  TIMED UP AND GO:  Was the test performed?  Yes  Length of time to ambulate 10 feet: 2 sec Gait steady and fast without use of assistive device    Cognitive Function:        08/23/2023   10:26 AM 03/22/2021    2:57 PM 03/10/2020    9:49 AM  6CIT Screen  What Year? 0 points 0 points 0 points  What month? 0 points 0 points 0 points  What time? 0 points 0 points 0 points  Count back from  20 0 points 0 points 0 points  Months in reverse 0 points 0 points 0 points  Repeat phrase 2 points 0 points 0 points  Total Score 2 points 0 points 0 points    Immunizations Immunization History  Administered Date(s) Administered   Fluad Quad(high Dose 65+) 12/19/2019, 12/17/2020, 12/20/2021   Fluad Trivalent(High Dose 65+) 01/02/2023   Influenza-Unspecified 01/17/2018, 12/14/2018   Moderna Covid-19 Fall Seasonal Vaccine 11yrs & older 01/28/2022   PFIZER(Purple Top)SARS-COV-2 Vaccination 05/27/2019, 06/17/2019, 12/19/2019   Pfizer Covid-19 Vaccine Bivalent Booster 47yrs & up 12/17/2020   Pfizer(Comirnaty)Fall Seasonal Vaccine 12 years and older 01/02/2023   Pneumococcal Conjugate-13 04/06/2017   Pneumococcal Polysaccharide-23 03/11/2021  Tdap 03/04/2019   Zoster Recombinant(Shingrix) 08/02/2017, 04/04/2018   Screening Tests Health Maintenance  Topic Date Due   Lung Cancer Screening  06/30/2020   OPHTHALMOLOGY EXAM  08/06/2022   COVID-19 Vaccine (7 - Pfizer risk 2024-25 season) 07/03/2023   Diabetic kidney evaluation - Urine ACR  07/29/2023   Colonoscopy  04/26/2024 (Originally 05/11/1996)   HEMOGLOBIN A1C  09/06/2023   INFLUENZA VACCINE  10/27/2023   FOOT EXAM  03/07/2024   Diabetic kidney evaluation - eGFR measurement  07/04/2024   Medicare Annual Wellness (AWV)  08/22/2024   MAMMOGRAM  01/31/2025   DTaP/Tdap/Td (2 - Td or Tdap) 03/03/2029   Pneumonia Vaccine 28+ Years old  Completed   DEXA SCAN  Completed   Hepatitis C Screening  Completed   Zoster Vaccines- Shingrix  Completed   HPV VACCINES  Aged Out   Meningococcal B Vaccine  Aged Out    Health Maintenance  Health Maintenance Due  Topic Date Due   Lung Cancer Screening  06/30/2020   OPHTHALMOLOGY EXAM  08/06/2022   COVID-19 Vaccine (7 - Pfizer risk 2024-25 season) 07/03/2023   Diabetic kidney evaluation - Urine ACR  07/29/2023   Additional Screening:  Vision Screening: Recommended annual ophthalmology  exams for early detection of glaucoma and other disorders of the eye. Is the patient up to date with their annual eye exam?  No , Has appt next month Who is the provider or what is the name of the office in which the patient attends annual eye exams? [facility]   Dental Screening: Recommended annual dental exams for proper oral hygiene  Diabetic Foot Exam: Completed  Community Resource Referral / Chronic Care Management: CRR required this visit?  No   CCM required this visit?  No     Plan:     I have personally reviewed and noted the following in the patient's chart:   Medical and social history Use of alcohol, tobacco or illicit drugs  Current medications and supplements including opioid prescriptions. Patient is not currently taking opioid prescriptions. Functional ability and status Nutritional status Physical activity Advanced directives List of other physicians Hospitalizations, surgeries, and ER visits in previous 12 months Vitals Screenings to include cognitive, depression, and falls Referrals and appointments  In addition, I have reviewed and discussed with patient certain preventive protocols, quality metrics, and best practice recommendations. A written personalized care plan for preventive services as well as general preventive health recommendations were provided to patient.

## 2023-08-24 ENCOUNTER — Ambulatory Visit: Payer: Self-pay | Admitting: Family Medicine

## 2023-08-24 LAB — CBC WITH DIFFERENTIAL/PLATELET
Basophils Absolute: 0 10*3/uL (ref 0.0–0.2)
Basos: 1 %
EOS (ABSOLUTE): 0 10*3/uL (ref 0.0–0.4)
Eos: 1 %
Hematocrit: 44.7 % (ref 34.0–46.6)
Hemoglobin: 13.7 g/dL (ref 11.1–15.9)
Immature Grans (Abs): 0 10*3/uL (ref 0.0–0.1)
Immature Granulocytes: 0 %
Lymphocytes Absolute: 1.8 10*3/uL (ref 0.7–3.1)
Lymphs: 24 %
MCH: 28.4 pg (ref 26.6–33.0)
MCHC: 30.6 g/dL — ABNORMAL LOW (ref 31.5–35.7)
MCV: 93 fL (ref 79–97)
Monocytes Absolute: 0.6 10*3/uL (ref 0.1–0.9)
Monocytes: 7 %
Neutrophils Absolute: 5.1 10*3/uL (ref 1.4–7.0)
Neutrophils: 67 %
Platelets: 233 10*3/uL (ref 150–450)
RBC: 4.82 x10E6/uL (ref 3.77–5.28)
RDW: 13.1 % (ref 11.7–15.4)
WBC: 7.6 10*3/uL (ref 3.4–10.8)

## 2023-08-24 LAB — COMPREHENSIVE METABOLIC PANEL WITH GFR
ALT: 10 IU/L (ref 0–32)
AST: 15 IU/L (ref 0–40)
Albumin: 4 g/dL (ref 3.8–4.8)
Alkaline Phosphatase: 90 IU/L (ref 44–121)
BUN/Creatinine Ratio: 22 (ref 12–28)
BUN: 17 mg/dL (ref 8–27)
Bilirubin Total: 0.4 mg/dL (ref 0.0–1.2)
CO2: 20 mmol/L (ref 20–29)
Calcium: 9 mg/dL (ref 8.7–10.3)
Chloride: 100 mmol/L (ref 96–106)
Creatinine, Ser: 0.79 mg/dL (ref 0.57–1.00)
Globulin, Total: 2 g/dL (ref 1.5–4.5)
Glucose: 98 mg/dL (ref 70–99)
Potassium: 4.9 mmol/L (ref 3.5–5.2)
Sodium: 134 mmol/L (ref 134–144)
Total Protein: 6 g/dL (ref 6.0–8.5)
eGFR: 79 mL/min/{1.73_m2} (ref >59)

## 2023-08-24 LAB — MICROALBUMIN / CREATININE URINE RATIO
Creatinine, Urine: 14.1 mg/dL
Microalb/Creat Ratio: <21 mg/g{creat} (ref 0–29)
Microalbumin, Urine: <3 ug/mL

## 2023-08-24 LAB — LIPID PANEL
Chol/HDL Ratio: 4 ratio (ref 0.0–4.4)
Cholesterol, Total: 241 mg/dL — ABNORMAL HIGH (ref 100–199)
HDL: 61 mg/dL (ref >39)
LDL Chol Calc (NIH): 158 mg/dL — ABNORMAL HIGH (ref 0–99)
Triglycerides: 122 mg/dL (ref 0–149)
VLDL Cholesterol Cal: 22 mg/dL (ref 5–40)

## 2023-08-24 LAB — HEMOGLOBIN A1C
Est. average glucose Bld gHb Est-mCnc: 114 mg/dL
Hgb A1c MFr Bld: 5.6 % (ref 4.8–5.6)

## 2023-08-24 LAB — TSH: TSH: 0.966 u[IU]/mL (ref 0.450–4.500)

## 2023-08-30 ENCOUNTER — Telehealth: Payer: Self-pay

## 2023-08-30 ENCOUNTER — Other Ambulatory Visit: Payer: Self-pay | Admitting: Family Medicine

## 2023-08-30 DIAGNOSIS — E782 Mixed hyperlipidemia: Secondary | ICD-10-CM

## 2023-08-30 NOTE — Progress Notes (Signed)
 Complex Care Management Note Care Guide Note  08/30/2023 Name: CHAE SHUSTER MRN: [id number] DOB: [birth year]   Complex Care Management Outreach Attempts: An unsuccessful telephone outreach was attempted today to offer the patient information about available complex care management services.  Follow Up Plan:  Additional outreach attempts will be made to offer the patient complex care management information and services.   Encounter Outcome:  No Answer  [facility]  [facility], [facility] Health Care Management Assistant Direct Dial: [phone]  Fax: [phone]

## 2023-08-30 NOTE — Progress Notes (Signed)
 Complex Care Management Note  Care Guide Note 08/30/2023 Name: Hailey Greene MRN: [id number] DOB: 1950/12/26  Hailey Greene is a 73 y.o. year old female who sees Cox, Kirsten, MD for primary care. I reached out to Phyliss Breen by phone today to offer complex care management services.  [name] was given information about Complex Care Management services today including:   The Complex Care Management services include support from the care team which includes your Nurse Care Manager, Clinical Social Worker, or Pharmacist.  The Complex Care Management team is here to help remove barriers to the health concerns and goals most important to you. Complex Care Management services are voluntary, and the patient may decline or stop services at any time by request to their care team member.   Complex Care Management Consent Status: Patient agreed to services and verbal consent obtained.   Follow up plan:  Telephone appointment with complex care management team member scheduled for:  08/31/23 & 09/08/23.   Encounter Outcome:  Patient Scheduled  [facility]  [facility], [facility] Health Care Management Assistant Direct Dial: [phone]  Fax: [phone]

## 2023-08-31 ENCOUNTER — Encounter: Payer: Self-pay | Admitting: Licensed Clinical Social Worker

## 2023-08-31 ENCOUNTER — Telehealth: Admitting: Licensed Clinical Social Worker

## 2023-09-05 ENCOUNTER — Telehealth: Payer: Self-pay

## 2023-09-05 NOTE — Telephone Encounter (Signed)
 Done. Dr. Sedalia Muta

## 2023-09-05 NOTE — Telephone Encounter (Addendum)
 Called [employer] and they required order for discontinued. I faxed to [phone].  I called patient and she states that she received her oxygen  from [employer]. I faxed a letterhead to discontinue oxygen  and pick them up. I faxed to [phone].  Copied from CRM [id number]. Topic: Clinical - Order For Equipment >> Sep 05, 2023  8:36 AM El Gravely T wrote: Reason for CRM: Reason for CRM: Patient calling to request oxygen  tank be picked up by dispensing company. As she states, this was previously discussed in last visit, that she no longer uses it and has not had to use it in a while.  Patient also requesting a call from company prior to picking up oxygen  tank to ensure at residence.  Patient can reached at [phone], or [phone].

## 2023-09-07 NOTE — Progress Notes (Addendum)
 Surgical Instructions   Your procedure is scheduled on Wednesday June 18. Report to [facility] Main Entrance A at 8:00 A.M., then check in with the Admitting office. Any questions or running late day of surgery: call [phone]  Questions prior to your surgery date: call [phone], Monday-Friday, 8am-4pm. If you experience any cold or flu symptoms such as cough, fever, chills, shortness of breath, etc. between now and your scheduled surgery, please notify us  at the above number.     Remember:  Do not eat after midnight the night before your surgery   You may drink clear liquids until 7:00am the morning of your surgery.   Clear liquids allowed are: Water , Non-Citrus Juices (without pulp), Carbonated Beverages, Clear Tea (no milk, honey, etc.), Black Coffee Only (NO MILK, CREAM OR POWDERED CREAMER of any kind), and Gatorade.    Take these medicines the morning of surgery with A SIP OF WATER   budeson-glycopyrrolate-formoterol  (BREZTRI  AEROSPHERE)  ezetimibe  (ZETIA )  fluticasone  (FLONASE )  loratadine  (CLARITIN )  omeprazole  (PRILOSEC)  pantoprazole  (PROTONIX )  pregabalin  (LYRICA )  rosuvastatin  (CRESTOR )  solifenacin  (VESICARE )  SYMBICORT   topiramate  (TOPAMAX )  vortioxetine  HBr (TRINTELLIX )   May take these medicines IF NEEDED: Albuterol -Budesonide  (AIRSUPRA )  dicyclomine  (BENTYL )   FOLLOW YOUR SURGEON'S INSTRUCTIONS FOR STOPPING ASPIRIN . IF NO INSTRUCTIONS WERE GIVEN, YOU MUST CALL THE OFFICE-[phone].  IF YOU HAVE NOT STOPPED TAKING YOUR PHENTERMINE , DO NOT TAKE ANY MORE DOSES PRIOR TO SURGERY.   One week prior to surgery, STOP taking any  Aleve, Naproxen, Ibuprofen, Motrin, Advil, Goody's, BC's, all herbal medications, fish oil, and non-prescription vitamins. This includes your icosapent  Ethyl (VASCEPA ) .          WHAT DO I DO ABOUT MY DIABETES MEDICATION?  HOLD Semaglutide  7 DAYS PRIOR TO SURGERY. LAST DOSE SHOULD BE ON OR BEFORE JUNE 10.  HOW TO MANAGE YOUR  DIABETES BEFORE AND AFTER SURGERY  Why is it important to control my blood sugar before and after surgery? Improving blood sugar levels before and after surgery helps healing and can limit problems. A way of improving blood sugar control is eating a healthy diet by:  Eating less sugar and carbohydrates  Increasing activity/exercise  Talking with your doctor about reaching your blood sugar goals High blood sugars (greater than 180 mg/dL) can raise your risk of infections and slow your recovery, so you will need to focus on controlling your diabetes during the weeks before surgery. Make sure that the doctor who takes care of your diabetes knows about your planned surgery including the date and location.  How do I manage my blood sugar before surgery? Check your blood sugar at least 4 times a day, starting 2 days before surgery, to make sure that the level is not too high or low.  Check your blood sugar the morning of your surgery when you wake up and every 2 hours until you get to the Short Stay unit.  If your blood sugar is less than 70 mg/dL, you will need to treat for low blood sugar: Do not take insulin . Treat a low blood sugar (less than 70 mg/dL) with  cup of clear juice (cranberry or apple), 4 glucose tablets, OR glucose gel. Recheck blood sugar in 15 minutes after treatment (to make sure it is greater than 70 mg/dL). If your blood sugar is not greater than 70 mg/dL on recheck, call [phone] for further instructions. Report your blood sugar to the short stay nurse when you get to Short Stay.  If you are  admitted to the hospital after surgery: Your blood sugar will be checked by the staff and you will probably be given insulin  after surgery (instead of oral diabetes medicines) to make sure you have good blood sugar levels. The goal for blood sugar control after surgery is 80-180 mg/dL.            Do NOT Smoke (Tobacco/Vaping) for 24 hours prior to your procedure.  If you use a  CPAP at night, you may bring your mask/headgear for your overnight stay.   You will be asked to remove any contacts, glasses, piercing's, hearing aid's, dentures/partials prior to surgery. Please bring cases for these items if needed.    Patients discharged the day of surgery will not be allowed to drive home, and someone needs to stay with them for 24 hours.  SURGICAL WAITING ROOM VISITATION Patients may have no more than 2 support people in the waiting area - these visitors may rotate.   Pre-op nurse will coordinate an appropriate time for 1 ADULT support person, who may not rotate, to accompany patient in pre-op.  Children under the age of 44 must have an adult with them who is not the patient and must remain in the main waiting area with an adult.  If the patient needs to stay at the hospital during part of their recovery, the visitor guidelines for inpatient rooms apply.  Please refer to the [facility] website for the visitor guidelines for any additional information.   If you received a COVID test during your pre-op visit  it is requested that you wear a mask when out in public, stay away from anyone that may not be feeling well and notify your surgeon if you develop symptoms. If you have been in contact with anyone that has tested positive in the last 10 days please notify you surgeon.      Pre-operative CHG Bathing Instructions   You can play a key role in reducing the risk of infection after surgery. Your skin needs to be as free of germs as possible. You can reduce the number of germs on your skin by washing with CHG (chlorhexidine  gluconate) soap before surgery. CHG is an antiseptic soap that kills germs and continues to kill germs even after washing.   DO NOT use if you have an allergy to chlorhexidine /CHG or antibacterial soaps. If your skin becomes reddened or irritated, stop using the CHG and notify one of our RNs at [phone].              TAKE A SHOWER THE NIGHT BEFORE  SURGERY AND THE DAY OF SURGERY    Please keep in mind the following:  DO NOT shave, including legs and underarms, 48 hours prior to surgery.   You may shave your face before/day of surgery.  Place clean sheets on your bed the night before surgery Use a clean washcloth (not used since being washed) for each shower. DO NOT sleep with pet's night before surgery.  CHG Shower Instructions:  Wash your face and private area with normal soap. If you choose to wash your hair, wash first with your normal shampoo.  After you use shampoo/soap, rinse your hair and body thoroughly to remove shampoo/soap residue.  Turn the water  OFF and apply half the bottle of CHG soap to a CLEAN washcloth.  Apply CHG soap ONLY FROM YOUR NECK DOWN TO YOUR TOES (washing for 3-5 minutes)  DO NOT use CHG soap on face, private areas, open wounds, or  sores.  Pay special attention to the area where your surgery is being performed.  If you are having back surgery, having someone wash your back for you may be helpful. Wait 2 minutes after CHG soap is applied, then you may rinse off the CHG soap.  Pat dry with a clean towel  Put on clean pajamas    Additional instructions for the day of surgery: DO NOT APPLY any lotions, deodorants, cologne, or perfumes.   Do not wear jewelry or makeup Do not wear nail polish, gel polish, artificial nails, or any other type of covering on natural nails (fingers and toes) Do not bring valuables to the hospital. [facility] is not responsible for valuables/personal belongings. Put on clean/comfortable clothes.  Please brush your teeth.  Ask your nurse before applying any prescription medications to the skin.

## 2023-09-08 ENCOUNTER — Inpatient Hospital Stay (HOSPITAL_COMMUNITY)
Admission: RE | Admit: 2023-09-08 | Discharge: 2023-09-08 | Disposition: A | Discharge status: Home / Self Care | Source: Ambulatory Visit

## 2023-09-08 ENCOUNTER — Telehealth: Payer: Self-pay

## 2023-09-08 NOTE — Progress Notes (Signed)
 Just spoke with patient she stated she forgot about appointment and she had been sick all night and husband was taking her to the ED

## 2023-09-12 ENCOUNTER — Other Ambulatory Visit: Payer: Self-pay | Admitting: Surgery

## 2023-09-12 ENCOUNTER — Encounter (HOSPITAL_COMMUNITY): Payer: Self-pay

## 2023-09-12 ENCOUNTER — Ambulatory Visit
Admission: RE | Admit: 2023-09-12 | Discharge: 2023-09-12 | Disposition: A | Discharge status: Home / Self Care | Source: Ambulatory Visit | Attending: Surgery | Admitting: Surgery

## 2023-09-12 ENCOUNTER — Other Ambulatory Visit: Payer: Self-pay

## 2023-09-12 ENCOUNTER — Encounter (HOSPITAL_COMMUNITY)
Admission: RE | Admit: 2023-09-12 | Discharge: 2023-09-12 | Disposition: A | Discharge status: Home / Self Care | Source: Ambulatory Visit | Attending: Surgery | Admitting: Surgery

## 2023-09-12 DIAGNOSIS — D241 Benign neoplasm of right breast: Secondary | ICD-10-CM

## 2023-09-12 DIAGNOSIS — Z01818 Encounter for other preprocedural examination: Secondary | ICD-10-CM | POA: Insufficient documentation

## 2023-09-12 LAB — GLUCOSE, CAPILLARY: Glucose-Capillary: 115 mg/dL — ABNORMAL HIGH (ref 70–99)

## 2023-09-12 NOTE — Progress Notes (Addendum)
 PCP - Cox, Kirsten, MD  Cardiologist -   PPM/ICD - denies Device Orders - n/a Rep Notified - n/a  Chest x-ray - 06-16-23 EKG - 06-16-23 Stress Test -  ECHO - 03-03-23 Cardiac Cath -   Sleep Study - years ago per patient CPAP - no  Fasting Blood Sugar - Per patient around 150. Blood sugar at PAT 151 Checks Blood Sugar daily LAST A1c 5.6 on 08-23-23  Last dose of GLP1 agonist-  Semaglutide   GLP1 instructions: Yes Last dose 09-05-23  Blood Thinner Instructions:denies Aspirin  Instructions:instructed to call surgeons office for further instructions  ERAS Protcol -clear liquids until 7:00   COVID TEST- n/a   Anesthesia review: yes breast seed. Per patient + for covid and strep recently. She reports no symptoms of covid for last 2 weeks.  Thursday 09-14-23 will be 3 weeks out from covid she reports she did know she had covid until going to doctor for GI symptoms.  She denies any GI symptoms. Has completed antibiotics for strep, still continues with flagyl  Patient denies shortness of breath, fever, cough and chest pain at PAT appointment   All instructions explained to the patient, with a verbal understanding of the material. Patient agrees to go over the instructions while at home for a better understanding. Patient also instructed to self quarantine after being tested for COVID-19. The opportunity to ask questions was provided.

## 2023-09-13 ENCOUNTER — Ambulatory Visit (HOSPITAL_COMMUNITY)

## 2023-09-13 ENCOUNTER — Ambulatory Visit (HOSPITAL_COMMUNITY)
Admission: RE | Admit: 2023-09-13 | Discharge: 2023-09-13 | Disposition: A | Discharge status: Home / Self Care | Attending: Surgery | Admitting: Surgery

## 2023-09-13 ENCOUNTER — Encounter (HOSPITAL_COMMUNITY)
Admission: RE | Disposition: A | Discharge status: Home / Self Care | Payer: Self-pay | Source: Home / Self Care | Attending: Surgery

## 2023-09-13 ENCOUNTER — Ambulatory Visit
Admission: RE | Admit: 2023-09-13 | Discharge: 2023-09-13 | Disposition: A | Discharge status: Home / Self Care | Source: Ambulatory Visit | Attending: Surgery | Admitting: Surgery

## 2023-09-13 ENCOUNTER — Encounter (HOSPITAL_COMMUNITY): Payer: Self-pay | Admitting: Surgery

## 2023-09-13 ENCOUNTER — Other Ambulatory Visit: Payer: Self-pay

## 2023-09-13 DIAGNOSIS — Z6831 Body mass index (BMI) 31.0-31.9, adult: Secondary | ICD-10-CM | POA: Insufficient documentation

## 2023-09-13 DIAGNOSIS — C50411 Malignant neoplasm of upper-outer quadrant of right female breast: Secondary | ICD-10-CM | POA: Insufficient documentation

## 2023-09-13 DIAGNOSIS — J45909 Unspecified asthma, uncomplicated: Secondary | ICD-10-CM

## 2023-09-13 DIAGNOSIS — G4730 Sleep apnea, unspecified: Secondary | ICD-10-CM | POA: Diagnosis not present

## 2023-09-13 DIAGNOSIS — E1142 Type 2 diabetes mellitus with diabetic polyneuropathy: Secondary | ICD-10-CM | POA: Diagnosis not present

## 2023-09-13 DIAGNOSIS — Z87891 Personal history of nicotine dependence: Secondary | ICD-10-CM | POA: Diagnosis not present

## 2023-09-13 DIAGNOSIS — D241 Benign neoplasm of right breast: Secondary | ICD-10-CM

## 2023-09-13 DIAGNOSIS — Z01818 Encounter for other preprocedural examination: Secondary | ICD-10-CM

## 2023-09-13 DIAGNOSIS — Z8616 Personal history of COVID-19: Secondary | ICD-10-CM | POA: Insufficient documentation

## 2023-09-13 DIAGNOSIS — E669 Obesity, unspecified: Secondary | ICD-10-CM | POA: Diagnosis not present

## 2023-09-13 DIAGNOSIS — I10 Essential (primary) hypertension: Secondary | ICD-10-CM

## 2023-09-13 HISTORY — DX: Myoneural disorder, unspecified: G70.9

## 2023-09-13 HISTORY — DX: Unspecified asthma, uncomplicated: J45.909

## 2023-09-13 HISTORY — DX: Sleep apnea, unspecified: G47.30

## 2023-09-13 LAB — GLUCOSE, CAPILLARY
Glucose-Capillary: 117 mg/dL — ABNORMAL HIGH (ref 70–99)
Glucose-Capillary: 100 mg/dL — ABNORMAL HIGH (ref 70–99)
Glucose-Capillary: 97 mg/dL (ref 70–99)

## 2023-09-13 SURGERY — EXCISION OF BREAST BIOPSY
Anesthesia: General | Site: Breast | Laterality: Right

## 2023-09-13 MED ORDER — CEFAZOLIN SODIUM-DEXTROSE 2-4 GM/100ML-% IV SOLN
2.0000 g | INTRAVENOUS | Status: AC
  Administered 2023-09-13: 2 g via INTRAVENOUS

## 2023-09-13 MED ORDER — OXYCODONE HCL 5 MG PO TABS
5.0000 mg | ORAL_TABLET | Freq: Once | ORAL | Status: AC | PRN
  Administered 2023-09-13: 5 mg via ORAL

## 2023-09-13 MED ORDER — FENTANYL CITRATE (PF) 250 MCG/5ML IJ SOLN
INTRAMUSCULAR | Status: AC
  Filled 2023-09-13: qty 5

## 2023-09-13 MED ORDER — CHLORHEXIDINE GLUCONATE CLOTH 2 % EX PADS: 6.0000 | MEDICATED_PAD | Freq: Once | CUTANEOUS | Status: DC

## 2023-09-13 MED ORDER — ONDANSETRON HCL 4 MG/2ML IJ SOLN
INTRAMUSCULAR | Status: DC | PRN
  Administered 2023-09-13: 4 mg via INTRAVENOUS

## 2023-09-13 MED ORDER — ACETAMINOPHEN 500 MG PO TABS
ORAL_TABLET | ORAL | Status: AC
  Administered 2023-09-13: 1000 mg via ORAL
  Filled 2023-09-13: qty 2

## 2023-09-13 MED ORDER — OXYCODONE HCL 5 MG PO TABS
ORAL_TABLET | ORAL | Status: AC
Start: 2023-09-13 — End: 2023-09-13
  Filled 2023-09-13: qty 1

## 2023-09-13 MED ORDER — PHENYLEPHRINE 80 MCG/ML (10ML) SYRINGE FOR IV PUSH (FOR BLOOD PRESSURE SUPPORT)
PREFILLED_SYRINGE | INTRAVENOUS | Status: DC | PRN
  Administered 2023-09-13 (×3): 80 ug via INTRAVENOUS

## 2023-09-13 MED ORDER — CEFAZOLIN SODIUM-DEXTROSE 2-4 GM/100ML-% IV SOLN
INTRAVENOUS | Status: AC
  Filled 2023-09-13: qty 100

## 2023-09-13 MED ORDER — INSULIN ASPART 100 UNIT/ML IJ SOLN: 0.0000 [IU] | INTRAMUSCULAR | Status: DC | PRN

## 2023-09-13 MED ORDER — OXYCODONE HCL 5 MG/5ML PO SOLN: 5.0000 mg | Freq: Once | ORAL | Status: AC | PRN

## 2023-09-13 MED ORDER — ROCURONIUM BROMIDE 10 MG/ML (PF) SYRINGE
PREFILLED_SYRINGE | INTRAVENOUS | Status: AC
  Filled 2023-09-13: qty 10

## 2023-09-13 MED ORDER — AMISULPRIDE (ANTIEMETIC) 5 MG/2ML IV SOLN: 10.0000 mg | Freq: Once | INTRAVENOUS | Status: DC | PRN

## 2023-09-13 MED ORDER — ONDANSETRON HCL 4 MG/2ML IJ SOLN
INTRAMUSCULAR | Status: AC
  Filled 2023-09-13: qty 2

## 2023-09-13 MED ORDER — CHLORHEXIDINE GLUCONATE 0.12 % MT SOLN
OROMUCOSAL | Status: AC
  Administered 2023-09-13: 15 mL via OROMUCOSAL
  Filled 2023-09-13: qty 15

## 2023-09-13 MED ORDER — HYDROMORPHONE HCL 1 MG/ML IJ SOLN
INTRAMUSCULAR | Status: AC
Start: 2023-09-13 — End: 2023-09-13
  Filled 2023-09-13: qty 1

## 2023-09-13 MED ORDER — LIDOCAINE 2% (20 MG/ML) 5 ML SYRINGE
INTRAMUSCULAR | Status: AC
  Filled 2023-09-13: qty 5

## 2023-09-13 MED ORDER — PROPOFOL 10 MG/ML IV BOLUS
INTRAVENOUS | Status: AC
  Filled 2023-09-13: qty 20

## 2023-09-13 MED ORDER — FENTANYL CITRATE (PF) 250 MCG/5ML IJ SOLN
INTRAMUSCULAR | Status: DC | PRN
  Administered 2023-09-13: 50 ug via INTRAVENOUS

## 2023-09-13 MED ORDER — DEXAMETHASONE SODIUM PHOSPHATE 10 MG/ML IJ SOLN
INTRAMUSCULAR | Status: AC
  Filled 2023-09-13: qty 1

## 2023-09-13 MED ORDER — HYDROMORPHONE HCL 1 MG/ML IJ SOLN
0.2500 mg | INTRAMUSCULAR | Status: DC | PRN
  Administered 2023-09-13 (×2): 0.5 mg via INTRAVENOUS

## 2023-09-13 MED ORDER — MIDAZOLAM HCL 2 MG/2ML IJ SOLN
INTRAMUSCULAR | Status: AC
  Filled 2023-09-13: qty 2

## 2023-09-13 MED ORDER — BUPIVACAINE-EPINEPHRINE 0.25% -1:200000 IJ SOLN
INTRAMUSCULAR | Status: DC | PRN
  Administered 2023-09-13: 4 mL

## 2023-09-13 MED ORDER — CHLORHEXIDINE GLUCONATE 0.12 % MT SOLN: 15.0000 mL | Freq: Once | OROMUCOSAL | Status: AC

## 2023-09-13 MED ORDER — PROPOFOL 10 MG/ML IV BOLUS
INTRAVENOUS | Status: DC | PRN
  Administered 2023-09-13: 200 mg via INTRAVENOUS

## 2023-09-13 MED ORDER — SODIUM CHLORIDE 0.9 % IV SOLN: 12.5000 mg | INTRAVENOUS | Status: DC | PRN

## 2023-09-13 MED ORDER — ACETAMINOPHEN 500 MG PO TABS: 1000.0000 mg | ORAL_TABLET | ORAL | Status: AC

## 2023-09-13 MED ORDER — LACTATED RINGERS IV SOLN: INTRAVENOUS | Status: DC

## 2023-09-13 MED ORDER — 0.9 % SODIUM CHLORIDE (POUR BTL) OPTIME
TOPICAL | Status: DC | PRN
Start: 2023-09-13 — End: 2023-09-13
  Administered 2023-09-13: 1000 mL

## 2023-09-13 MED ORDER — MIDAZOLAM HCL 2 MG/2ML IJ SOLN
INTRAMUSCULAR | Status: DC | PRN
  Administered 2023-09-13: 1 mg via INTRAVENOUS

## 2023-09-13 MED ORDER — ORAL CARE MOUTH RINSE: 15.0000 mL | Freq: Once | OROMUCOSAL | Status: AC

## 2023-09-13 MED ORDER — BUPIVACAINE-EPINEPHRINE (PF) 0.25% -1:200000 IJ SOLN
INTRAMUSCULAR | Status: AC
  Filled 2023-09-13: qty 30

## 2023-09-13 MED ORDER — LIDOCAINE 2% (20 MG/ML) 5 ML SYRINGE
INTRAMUSCULAR | Status: DC | PRN
  Administered 2023-09-13: 60 mg via INTRAVENOUS

## 2023-09-13 MED ORDER — DEXAMETHASONE SODIUM PHOSPHATE 10 MG/ML IJ SOLN
INTRAMUSCULAR | Status: DC | PRN
  Administered 2023-09-13: 5 mg via INTRAVENOUS

## 2023-09-13 SURGICAL SUPPLY — 33 items
BENZOIN TINCTURE PRP APPL 2/3 (GAUZE/BANDAGES/DRESSINGS) ×1 IMPLANT
CANISTER SUCTION 3000ML PPV (SUCTIONS) ×1 IMPLANT
CHLORAPREP W/TINT 26 (MISCELLANEOUS) ×1 IMPLANT
CLIP APPLIE 9.375 MED OPEN (MISCELLANEOUS) IMPLANT
COVER PROBE W GEL 5X96 (DRAPES) ×1 IMPLANT
COVER SURGICAL LIGHT HANDLE (MISCELLANEOUS) ×1 IMPLANT
DEVICE DUBIN SPECIMEN MAMMOGRA (MISCELLANEOUS) ×1 IMPLANT
DRAPE CHEST BREAST 15X10 FENES (DRAPES) ×1 IMPLANT
DRSG TEGADERM 4X4.5 CHG (GAUZE/BANDAGES/DRESSINGS) IMPLANT
DRSG TEGADERM 4X4.75 (GAUZE/BANDAGES/DRESSINGS) ×1 IMPLANT
ELECT CAUTERY BLADE 6.4 (BLADE) ×1 IMPLANT
ELECTRODE REM PT RTRN 9FT ADLT (ELECTROSURGICAL) ×1 IMPLANT
GAUZE SPONGE 2X2 8PLY STRL LF (GAUZE/BANDAGES/DRESSINGS) ×1 IMPLANT
GLOVE BIO SURGEON STRL SZ7 (GLOVE) ×1 IMPLANT
GLOVE BIOGEL PI IND STRL 7.5 (GLOVE) ×1 IMPLANT
GOWN STRL REUS W/ TWL LRG LVL3 (GOWN DISPOSABLE) ×2 IMPLANT
ILLUMINATOR WAVEGUIDE N/F (MISCELLANEOUS) IMPLANT
KIT BASIN OR (CUSTOM PROCEDURE TRAY) ×1 IMPLANT
KIT MARKER MARGIN INK (KITS) ×1 IMPLANT
LIGHT WAVEGUIDE WIDE FLAT (MISCELLANEOUS) IMPLANT
NDL HYPO 25GX1X1/2 BEV (NEEDLE) ×1 IMPLANT
NEEDLE HYPO 25GX1X1/2 BEV (NEEDLE) ×1 IMPLANT
NS IRRIG 1000ML POUR BTL (IV SOLUTION) ×1 IMPLANT
PACK GENERAL/GYN (CUSTOM PROCEDURE TRAY) ×1 IMPLANT
SPONGE T-LAP 4X18 ~~LOC~~+RFID (SPONGE) ×1 IMPLANT
STRIP CLOSURE SKIN 1/2X4 (GAUZE/BANDAGES/DRESSINGS) ×1 IMPLANT
STRIP CLOSURE SKIN 1/4X4 (GAUZE/BANDAGES/DRESSINGS) IMPLANT
SUT MNCRL AB 4-0 PS2 18 (SUTURE) ×1 IMPLANT
SUT SILK 2 0 SH (SUTURE) IMPLANT
SUT VIC AB 3-0 SH 27X BRD (SUTURE) ×1 IMPLANT
SYR CONTROL 10ML LL (SYRINGE) ×1 IMPLANT
TOWEL GREEN STERILE (TOWEL DISPOSABLE) ×1 IMPLANT
TOWEL GREEN STERILE FF (TOWEL DISPOSABLE) ×1 IMPLANT

## 2023-09-13 NOTE — Discharge Instructions (Signed)

## 2023-09-13 NOTE — Anesthesia Procedure Notes (Signed)
 Procedure Name: LMA Insertion Date/Time: 09/13/2023 10:24 AM  Performed by: Andera Cranmer J, CRNAPre-anesthesia Checklist: Patient identified, Emergency Drugs available, Suction available and Patient being monitored Patient Re-evaluated:Patient Re-evaluated prior to induction Oxygen  Delivery Method: Circle System Utilized Preoxygenation: Pre-oxygenation with 100% oxygen  Induction Type: IV induction LMA: LMA inserted LMA Size: 4.0 Number of attempts: 1 Placement Confirmation: positive ETCO2 and breath sounds checked- equal and bilateral Tube secured with: Tape Dental Injury: Teeth and Oropharynx as per pre-operative assessment

## 2023-09-13 NOTE — H&P (Signed)
 Chief Complaint: Breast Mass       History of Present Illness: Hailey Greene is a 73 y.o. female who is seen today as an office consultation at the request of Dr. Nathalie Baize for evaluation of Breast Mass .   This is a 73 year old female with no family history of breast cancer and no previous breast problems whoRecently underwent routine screening mammogram.  This detected a mass in the right breast.  She underwent further diagnostic mammogram and ultrasound that revealed a mass located at 10:00 in the right breast, 7 cm from the nipple measuring 7 mm in diameter.  She underwent biopsy of this area that revealed intraductal papilloma with no sign of atypia or malignancy.  The patient is referred to us  to discuss excisional biopsy of this area.  She has no breast complaints.   Interestingly, the patient's chart reveals a lengthy list of medical problems and prescribed medications.  However, the patient states that she stopped taking all of her prescribed medicines 6 months ago.  She has discussed this with her primary care physician.  The patient has no signs of any medical issues at this time.  Her vital signs are normal today.  She has no other complaints.   Review of Systems: A complete review of systems was obtained from the patient.  I have reviewed this information and discussed as appropriate with the patient.  See HPI as well for other ROS.   Review of Systems  Constitutional: Negative.   HENT: Negative.    Eyes: Negative.   Respiratory:  Positive for cough.   Cardiovascular: Negative.   Gastrointestinal: Negative.   Genitourinary: Negative.   Musculoskeletal: Negative.   Skin: Negative.   Neurological: Negative.   Endo/Heme/Allergies: Negative.   Psychiatric/Behavioral: Negative.          Medical History: History reviewed. No pertinent past medical history.      Patient Active Problem List  Diagnosis   Asthma, severe persistent, poorly-controlled, with acute exacerbation  (CMS/HHS-HCC)   Bronchiectasis with acute exacerbation (CMS/HHS-HCC)   Carpal tunnel syndrome   Diabetic polyneuropathy associated with type 2 diabetes mellitus (CMS/HHS-HCC)   Essential hypertension, benign   Gastroesophageal reflux disease   Idiopathic progressive neuropathy   Mild intermittent asthma without complication (HHS-HCC)   Mixed hyperlipidemia   Other irritable bowel syndrome   Ventral hernia without obstruction or gangrene      History reviewed. No pertinent surgical history.    No Known Allergies   No current outpatient medications on file prior to visit.    No current facility-administered medications on file prior to visit.      History reviewed. No pertinent family history.    Social History       Tobacco Use  Smoking Status Never  Smokeless Tobacco Never      Social History        Socioeconomic History   Marital status: Married  Tobacco Use   Smoking status: Never   Smokeless tobacco: Never  Vaping Use   Vaping status: Never Used  Substance and Sexual Activity   Alcohol use: Never   Drug use: Never    Social Drivers of Acupuncturist Strain: Low Risk  (03/08/2023)    Received from [facility]    Overall Financial Resource Strain (CARDIA)     Difficulty of Paying Living Expenses: Not hard at all  Food Insecurity: No Food Insecurity (03/08/2023)    Received from [facility]  Hunger Vital Sign     Worried About Running Out of Food in the Last Year: Never true     Ran Out of Food in the Last Year: Never true  Transportation Needs: No Transportation Needs (03/08/2023)    Received from [facility] - Transportation     Lack of Transportation (Medical): No     Lack of Transportation (Non-Medical): No  Physical Activity: Insufficiently Active (03/08/2023)    Received from [facility]    Exercise Vital Sign     Days of Exercise per Week: 7 days     Minutes of Exercise per Session: 20 min  Stress: No Stress  Concern Present (03/08/2023)    Received from [facility] - Occupational Stress Questionnaire     Feeling of Stress : Not at all  Social Connections: Moderately Integrated (03/08/2023)    Received from [facility]    Social Connection and Isolation Panel [NHANES]     Frequency of Communication with Friends and Family: More than three times a week     Frequency of Social Gatherings with Friends and Family: More than three times a week     Attends Religious Services: More than 4 times per year     Active Member of Clubs or Organizations: No     Attends Banker Meetings: Never     Marital Status: Married      Objective:          Vitals:    06/05/23 0940 06/05/23 0941  BP: 122/68    Pulse: 104    Temp: 36.4 C (97.6 F)    SpO2: 95%    Weight: 80.2 kg (176 lb 12.8 oz)    Height: 160 cm (5' 3)    PainSc:   0-No pain    Body mass index is 31.32 kg/m.   Physical Exam    Constitutional:  WDWN in NAD, conversant, no obvious deformities; lying in bed comfortably Eyes:  Pupils equal, round; sclera anicteric; moist conjunctiva; no lid lag HENT:  Oral mucosa moist; good dentition  Neck:  No masses palpated, trachea midline; no thyromegaly Lungs:  CTA bilaterally; normal respiratory effort Breasts:  symmetric, no nipple changes; no palpable masses or lymphadenopathy on either side CV:  Regular rate and rhythm; no murmurs; extremities well-perfused with no edema Abd:  +bowel sounds, soft, non-tender, no palpable organomegaly; no palpable hernias Musc: Normal gait; no apparent clubbing or cyanosis in extremities Lymphatic:  No palpable cervical or axillary lymphadenopathy Skin:  Warm, dry; no sign of jaundice Psychiatric - alert and oriented x 4; calm mood and affect     Labs, Imaging and Diagnostic Testing: FINAL DIAGNOSIS        1. Breast, right, needle core biopsy, mass, 10:00, 7 cmfn (coil clip) :       -  INTRADUCTAL  PAPILLOMA WITH COLUMNAR CELL CHANGE AND USUAL DUCTAL HYPERPLASIA.      CLINICAL DATA:  Screening recall for a possible right breast mass.   EXAM: DIGITAL DIAGNOSTIC UNILATERAL RIGHT MAMMOGRAM WITH TOMOSYNTHESIS AND CAD; ULTRASOUND RIGHT BREAST LIMITED   TECHNIQUE: Right digital diagnostic mammography and breast tomosynthesis was performed. The images were evaluated with computer-aided detection. ; Targeted ultrasound examination of the right breast was performed   COMPARISON:  Previous exam(s).   ACR Breast Density Category c: The breasts are heterogeneously dense, which may obscure small masses.   FINDINGS: Spot compression tomosynthesis images  of the upper outer right breast confirm a persistent subcentimeter oval mass with indistinct margins.   Ultrasound of the right breast at 10 o'clock, 7 cm from the nipple demonstrates a hypoechoic oval mass with indistinct margins measuring 7 x 4 x 6 mm. Ultrasound of the right axilla demonstrates multiple normal-appearing lymph nodes.   IMPRESSION: 1. There is an indeterminate 7 mm mass in the right breast at 10 o'clock.   2.  No evidence of right axillary lymphadenopathy.   RECOMMENDATION: Ultrasound guided biopsy is recommended for the right breast mass. We will schedule the patient for the procedure at her earliest convenience.   I have discussed the findings and recommendations with the patient. If applicable, a reminder letter will be sent to the patient regarding the next appointment.   BI-RADS CATEGORY  4: Suspicious.     Electronically Signed   By: Alinda Apley M.D.   On: 02/21/2023 13:31     Assessment and Plan:  Diagnoses and all orders for this visit:   Intraductal papilloma of breast, right     Recommend right breast radioactive seed localized excisional biopsy.The surgical procedure has been discussed with the patient.  Potential risks, benefits, alternative treatments, and expected outcomes have  been explained.  All of the patient's questions at this time have been answered.  The likelihood of reaching the patient's treatment goal is good.  The patient understands the proposed surgical procedure and wishes to proceed.  Hailey Otto. Eli Grizzle, MD, [facility] Surgery  General Surgery   09/13/2023 8:11 AM

## 2023-09-13 NOTE — Anesthesia Postprocedure Evaluation (Signed)
 Anesthesia Post Note  Patient: Hailey Greene  Procedure(s) Performed: RADIOACTIVE SEED GUIDED BREAST BIOPSY (Right: Breast)     Patient location during evaluation: PACU Anesthesia Type: General Level of consciousness: awake and alert Pain management: pain level controlled Vital Signs Assessment: post-procedure vital signs reviewed and stable Respiratory status: spontaneous breathing, nonlabored ventilation and respiratory function stable Cardiovascular status: blood pressure returned to baseline and stable Postop Assessment: no apparent nausea or vomiting Anesthetic complications: no   No notable events documented.  Last Vitals:  Vitals:   09/13/23 1130 09/13/23 1145  BP: (!) 118/57 115/79  Pulse: 73 69  Resp: 19 20  Temp:  36.7 C  SpO2: 94% 93%    Last Pain:  Vitals:   09/13/23 1145  PainSc: 5                  Earvin Goldberg

## 2023-09-13 NOTE — Op Note (Signed)
 Pre-op Diagnosis:  Right breast intraductal papilloma Post-op Diagnosis: same Procedure:  Right breast radioactive seed localized lumpectomy Surgeon:  Marquita Lias K. Resident:  Ardyth Kroner, MD I was personally present during the key and critical portions of this procedure and immediately available throughout the entire procedure, as documented in my operative note.  Anesthesia:  GEN - LMA Indications:  This is a 73 year old female with no family history of breast cancer and no previous breast problems whoRecently underwent routine screening mammogram. This detected a mass in the right breast. She underwent further diagnostic mammogram and ultrasound that revealed a mass located at 10:00 in the right breast, 7 cm from the nipple measuring 7 mm in diameter. She underwent biopsy of this area that revealed intraductal papilloma with no sign of atypia or malignancy. The patient is referred to us  to discuss excisional biopsy of this area. A radioactive seed was placed by Radiology yesterday.  Description of procedure: The patient is brought to the operating room placed in supine position on the operating room table. After an adequate level of general anesthesia was obtained, her right breast was prepped with ChloraPrep and draped in sterile fashion. A timeout was taken to ensure the proper patient and proper procedure. We interrogated the breast with the neoprobe. We made a transverse incision in the lateral right breastafter infiltrating with 0.25% Marcaine . Dissection was carried down in the breast tissue with cautery. We used the neoprobe to guide us  towards the radioactive seed. We excised an area of tissue around the radioactive seed 1.5 cm in diameter. The specimen was removed and was oriented with a paint kit. Specimen mammogram showed the radioactive seed as well as the biopsy clip within the specimen. This was sent for pathologic examination. There is no residual radioactivity within the biopsy  cavity. We inspected carefully for hemostasis. The wound was thoroughly irrigated. The wound was closed with a deep layer of 3-0 Vicryl and a subcuticular layer of 4-0 Monocryl. Benzoin Steri-Strips were applied. The patient was then extubated and brought to the recovery room in stable condition. All sponge, instrument, and needle counts are correct.  Kari Otto. Eli Grizzle, MD, [facility] Surgery  General/ Trauma Surgery  09/13/2023 10:57 AM

## 2023-09-13 NOTE — Transfer of Care (Signed)
 Immediate Anesthesia Transfer of Care Note  Patient: Hailey Greene  Procedure(s) Performed: RADIOACTIVE SEED GUIDED BREAST BIOPSY (Right: Breast)  Patient Location: PACU  Anesthesia Type:General  Level of Consciousness: awake, alert , and oriented  Airway & Oxygen  Therapy: Patient Spontanous Breathing and Patient connected to face mask oxygen   Post-op Assessment: Report given to RN and Post -op Vital signs reviewed and stable  Post vital signs: Reviewed and stable  Last Vitals:  Vitals Value Taken Time  BP 125/49 09/13/23 11:16  Temp 36.6 C 09/13/23 11:15  Pulse 74 09/13/23 11:23  Resp 17 09/13/23 11:23  SpO2 94 % 09/13/23 11:23  Vitals shown include unfiled device data.  Last Pain:  Vitals:   09/13/23 1115  PainSc: 0-No pain         Complications: No notable events documented.

## 2023-09-13 NOTE — Anesthesia Preprocedure Evaluation (Addendum)
 Anesthesia Evaluation  Patient identified by MRN, date of birth, ID band Patient awake    Reviewed: Allergy & Precautions, NPO status , Patient's Chart, lab work & pertinent test results  Airway Mallampati: III  TM Distance: >3 FB Neck ROM: Full    Dental no notable dental hx. (+) Teeth Intact   Pulmonary asthma , sleep apnea , pneumonia, resolved, former smoker Covid +, 12/31/2018   Pulmonary exam normal breath sounds clear to auscultation       Cardiovascular hypertension, Pt. on medications Normal cardiovascular exam Rhythm:Regular Rate:Normal     Neuro/Psych Seizures -,  PSYCHIATRIC DISORDERS Anxiety Depression    Diabetic neuropathy Restless legs syndrome  Neuromuscular disease    GI/Hepatic Neg liver ROS,GERD  Medicated and Controlled,,  Endo/Other  diabetes, Well Controlled, Type 2  Obesity Hyperlipidemia  Renal/GU negative Renal ROS  negative genitourinary   Musculoskeletal  (+) Arthritis , Osteoarthritis,  OA left knee   Abdominal  (+) + obese  Peds  Hematology   Anesthesia Other Findings   Reproductive/Obstetrics                             Anesthesia Physical Anesthesia Plan  ASA: 3  Anesthesia Plan: General   Post-op Pain Management: Tylenol  PO (pre-op)*   Induction: Intravenous  PONV Risk Score and Plan: 3 and Ondansetron , Treatment may vary due to age or medical condition, Dexamethasone  and Midazolam   Airway Management Planned: LMA  Additional Equipment:   Intra-op Plan:   Post-operative Plan: Extubation in OR  Informed Consent: I have reviewed the patients History and Physical, chart, labs and discussed the procedure including the risks, benefits and alternatives for the proposed anesthesia with the patient or authorized representative who has indicated his/her understanding and acceptance.     Dental advisory given  Plan Discussed with: CRNA and  Surgeon  Anesthesia Plan Comments:         Anesthesia Quick Evaluation

## 2023-09-14 ENCOUNTER — Ambulatory Visit: Payer: Self-pay | Admitting: Family Medicine

## 2023-09-15 ENCOUNTER — Other Ambulatory Visit: Payer: Self-pay | Admitting: Family Medicine

## 2023-09-19 LAB — SURGICAL PATHOLOGY

## 2023-09-20 NOTE — Progress Notes (Signed)
 Belinda Cough, MD  Martin Burnard HERO This patient has a new diagnosis of invasive breast cancer after a lumpectomy last week. She needs a postop appt with me in the next two weeks. Please put in referrals to oncology and rad onc. Thanks  Tsuei

## 2023-09-21 ENCOUNTER — Encounter (HOSPITAL_COMMUNITY): Payer: Self-pay | Admitting: Surgery

## 2023-09-21 NOTE — OR Nursing (Signed)
 Late entry:  Due to an error in procedure names involving the radioactive seeds, the procedure documented on the day of surgery was incorrect.  I reviewed the Operative note dictated by the surgeon and corrected the procedure to match what was completed.  Berwyn Eagles, RN

## 2023-09-22 ENCOUNTER — Telehealth: Payer: Self-pay | Admitting: Oncology

## 2023-09-22 ENCOUNTER — Telehealth: Payer: Self-pay | Admitting: Radiation Oncology

## 2023-09-22 NOTE — Telephone Encounter (Signed)
 Patient has been scheduled for follow-up visit per 09/20/23 LOS.  Pt aware of scheduled appt details.

## 2023-09-22 NOTE — Telephone Encounter (Signed)
 Called patient to schedule a consultation w. Dr. Izell. Patient stated she would like to be seen closer to home at [facility] location. Closing referral at this time, notified Dr. Worthy office.

## 2023-09-26 ENCOUNTER — Telehealth: Payer: Self-pay

## 2023-09-26 NOTE — Progress Notes (Unsigned)
 Complex Care Management Note Care Guide Note  09/26/2023 Name: Hailey Greene MRN: [id number] DOB: 1950/09/05   Complex Care Management Outreach Attempts: An unsuccessful telephone outreach was attempted today to offer the patient information about available complex care management services.  Follow Up Plan:  Additional outreach attempts will be made to offer the patient complex care management information and services.   Encounter Outcome:  No Answer  Dreama [facility]  [facility], [facility] Health Care Management Assistant Direct Dial: [phone]  Fax: [phone]

## 2023-09-28 ENCOUNTER — Encounter: Payer: Self-pay | Admitting: Oncology

## 2023-09-28 ENCOUNTER — Inpatient Hospital Stay: Attending: Oncology | Admitting: Oncology

## 2023-09-28 ENCOUNTER — Other Ambulatory Visit: Payer: Self-pay | Admitting: Oncology

## 2023-09-28 ENCOUNTER — Inpatient Hospital Stay

## 2023-09-28 ENCOUNTER — Telehealth: Payer: Self-pay | Admitting: Oncology

## 2023-09-28 VITALS — BP 127/60 | HR 76 | Temp 98.0°F | Resp 16 | Ht 63.0 in | Wt 174.3 lb

## 2023-09-28 DIAGNOSIS — Z170 Estrogen receptor positive status [ER+]: Secondary | ICD-10-CM

## 2023-09-28 DIAGNOSIS — C50411 Malignant neoplasm of upper-outer quadrant of right female breast: Secondary | ICD-10-CM | POA: Diagnosis not present

## 2023-09-28 LAB — CMP (CANCER CENTER ONLY)
ALT: 8 U/L (ref 0–44)
AST: 14 U/L — ABNORMAL LOW (ref 15–41)
Albumin: 3.8 g/dL (ref 3.5–5.0)
Alkaline Phosphatase: 92 U/L (ref 38–126)
Anion gap: 12 (ref 5–15)
BUN: 23 mg/dL (ref 8–23)
CO2: 22 mmol/L (ref 22–32)
Calcium: 9.1 mg/dL (ref 8.9–10.3)
Chloride: 102 mmol/L (ref 98–111)
Creatinine: 1.03 mg/dL — ABNORMAL HIGH (ref 0.44–1.00)
GFR, Estimated: 57 mL/min — ABNORMAL LOW (ref >60)
Glucose, Bld: 109 mg/dL — ABNORMAL HIGH (ref 70–99)
Potassium: 3.9 mmol/L (ref 3.5–5.1)
Sodium: 137 mmol/L (ref 135–145)
Total Bilirubin: 0.3 mg/dL (ref 0.0–1.2)
Total Protein: 6.5 g/dL (ref 6.5–8.1)

## 2023-09-28 LAB — CBC WITH DIFFERENTIAL (CANCER CENTER ONLY)
Abs Immature Granulocytes: 0.02 10*3/uL (ref 0.00–0.07)
Basophils Absolute: 0 10*3/uL (ref 0.0–0.1)
Basophils Relative: 0 %
Eosinophils Absolute: 0.1 10*3/uL (ref 0.0–0.5)
Eosinophils Relative: 1 %
HCT: 41.3 % (ref 36.0–46.0)
Hemoglobin: 13.4 g/dL (ref 12.0–15.0)
Immature Granulocytes: 0 %
Lymphocytes Relative: 23 %
Lymphs Abs: 1.9 10*3/uL (ref 0.7–4.0)
MCH: 29.6 pg (ref 26.0–34.0)
MCHC: 32.4 g/dL (ref 30.0–36.0)
MCV: 91.4 fL (ref 80.0–100.0)
Monocytes Absolute: 0.6 10*3/uL (ref 0.1–1.0)
Monocytes Relative: 7 %
Neutro Abs: 5.8 10*3/uL (ref 1.7–7.7)
Neutrophils Relative %: 69 %
Platelet Count: 226 10*3/uL (ref 150–400)
RBC: 4.52 MIL/uL (ref 3.87–5.11)
RDW: 12.6 % (ref 11.5–15.5)
WBC Count: 8.4 10*3/uL (ref 4.0–10.5)
nRBC: 0 % (ref 0.0–0.2)

## 2023-09-28 MED ORDER — ANASTROZOLE 1 MG PO TABS: 1.0000 mg | ORAL_TABLET | Freq: Every day | ORAL | 5 refills | Status: DC

## 2023-09-28 NOTE — Progress Notes (Signed)
 [facility]  [address] [place],  [place]  [address] [phone]  Clinic Day: 09/28/23  Referring physician: Sherre Clapper, MD   ASSESSMENT & PLAN:  Assessment: Invasive Papillary Carcinoma of the Right Breast This was not present on her biopsy but was seen on the lumpectomy specimen with a 9 mm grade 2 lesion for a T1b N0 M0, stage IA. Her prognosis is good but I do recommend she take hormonal therapy in the form of Anastrozole  1 mg daily. She will not need chemotherapy or radiation. She is in agreement with this plan.    Plan: [name] is seen in the clinic for follow up of her newly diagnosed right breast cancer. We reviewed her history in detail and I informed her that her prognosis is good as this was found early thankfully on routine mammograms. She really doesn't have any significant risk factors for breast cancer. After reviewing her pathology I explained this is a slow growing, highly estrogen and progesterone positive, stage IA grade 2 invasive papillary carcinoma. Since she has already had her lumpectomy and is healing well, I informed her that I don't think she will need chemotherapy or radiation but will need adjuvant therapy with hormonal therapy, one pill daily for 5 years. I reviewed different hormone blockers and their side effects; such as Tamoxifen which has the potential of developing blood clots and uterine cancer. I also mentioned Anastrozole  and Letrozole which have the potential side effects of developing hot flashes, aching pains, thinning of the bones, nausea, and menopausal symptoms. I explained the importance of taking a hormone blocker as this decreases the risk of recurrence in right breast and lowers the incidence of cancer in the left breast. She agreed and I will prescribe Anastrozole  1 mg daily. Patient states that she feels well and has no complaints of pain. She has a WBC of 8.4, hemoglobin of 13.4, and platelet count of 226,000. Her CMP is normal  other than a slightly elevated creatinine of 1.03. Her PCP does routine labs and I will see her back in 3 months for reexamination. I discussed the assessment and treatment plan with the patient.  The patient was provided an opportunity to ask questions and all were answered.  The patient agreed with the plan and demonstrated an understanding of the instructions.  The patient was advised to call back if the symptoms worsen or if the condition fails to improve as anticipated.  Thank you for the opportunity to care for your patients.   I provided 36 minutes of face-to-face time during this this encounter and > 50% was spent counseling as documented under my assessment and plan.   Hailey VEAR Cornish, MD [facility] - A DEPT OF [facility] [address] [place] [address] Dept: [phone] Dept Fax: [phone]   I, Jasmine Lassiter, am acting as scribe for Hailey HILARIO Cornish, MD  I have reviewed this report as typed by the medical scribe, and it is complete and accurate.  Hailey VEAR Cornish, MD   7/22/20257:43 PM  CHIEF COMPLAINT:  CC: Invasive Papillary Carcinoma of the right breast.   Current Treatment:  Anastrozole  1 mg daily  HISTORY OF PRESENT ILLNESS:  Hailey Greene is a 73 y.o. female with a history of diabetes and asthma who is referred in consultation by Dr. Belinda for assessment and management of newly diagnosed papillary carcinoma of the right breast. This was found on a routine  screening mammogram done on 02/01/2024, which showed a possible mass in the right breast. She then had a diagnostic mammogram and ultrasound of the right breast on 02/21/2023 which further confirmed an indeterminate 7 mm mass in the right breast at 10 o'clock, 7 cm from the nipple. This was biopsied and pathology revealed intraductal papilloma with columnar cell changes and usual ductal hyperplasia. She then had a lumpectomy and final  pathology revealed a 0.9 cm, invasive papillary carcinoma, grade 2 that had clear margins for a T1b N0 M0, stage IA. This was ER and PR positive at 100%, HER2 (0) negative, with a Ki67 of 2%. She had a MRI of the head done on 07/01/2023 which showed no acute intracranial abnormality or significant interval change, central pontine T2 hyperintensity without associated enhancement, small mastoid effusions bilaterally with no obstructing nasopharyngeal lesion is present, and polyp or mucous retention cyst of the left maxillary sinus. She had a bone density scan on January 26, 2023 which was normal.   I have reviewed her chart and materials related to her cancer extensively and collaborated history with the patient. Summary of oncologic history is as follows: Oncology History  Breast cancer of upper-outer quadrant of right female breast (HCC)  09/13/2023 Cancer Staging   Staging form: Breast, AJCC 8th Edition - Clinical stage from 09/13/2023: Stage IA (cT1b, cN0, cM0, G2, ER+, PR+, HER2-) - Signed by Cornelius Hailey DEL, MD on 09/28/2023 Histopathologic type: Intraductal papillary adenocarcinoma with invasion Stage prefix: Initial diagnosis Method of lymph node assessment: Clinical Multigene prognostic tests performed: None Histologic grading system: 3 grade system Laterality: Right Tumor size (mm): 9 Lymph-vascular invasion (LVI): LVI not present (absent)/not identified Diagnostic confirmation: Positive histology Specimen type: Excision Staged by: Managing physician Ki-67 (%): 2 Stage used in treatment planning: Yes National guidelines used in treatment planning: Yes Type of national guideline used in treatment planning: NCCN   09/28/2023 Initial Diagnosis   Breast cancer of upper-outer quadrant of right female breast (HCC)    INTERVAL HISTORY:  Hailey Greene is seen in the clinic for follow up of her newly diagnosed right breast cancer. We reviewed her history in detail and I informed her that her  prognosis is good as this was found early thankfully on routine mammograms. She really doesn't have any significant risk factors for breast cancer. After reviewing her pathology I explained this is a slow growing, highly estrogen and progesterone positive, stage IA grade 2 invasive papillary carcinoma. Since she has already had her lumpectomy and is healing well, I informed her that I don't think she will need chemotherapy or radiation but will need adjuvant therapy with hormonal therapy one pill daily for 5 years. I reviewed different hormone blockers and their side effects; such as Tamoxifen which has the potential of developing blood clots and uterine cancer. I also mentioned Anastrozole  and Letrozole which has the potential side effects of developing hot flashes, aching pains, thinning of the bones, nausea, and menopausal symptoms. I explained the importance of taking a hormone blocker as this decreases the risk of recurrence in right breast and lowers the incidence of cancer in the left breast. She agreed and I will prescribe Anastrozole  1 mg daily. Patient states that she feels well and has no complaints of pain. She has a WBC of 8.4, hemoglobin of 13.4, and platelet count of 226,000. Her CMP is normal other than a slightly elevated creatinine of 1.03. Her PCP does routine labs and I will see her back in 3 months  for reexamination. She had a bone density scan on January 26, 2023 which was normal. She denies fever, chills, night sweats, or other signs of infection. She denies cardiorespiratory and gastrointestinal issues. She  denies pain. Her appetite is good. Her weight 174lbs. This patient is accompanied in the office by her husband Alm.   HISTORY:   Past Medical History:  Diagnosis Date   2019 novel coronavirus disease (COVID-19) 12/31/2018   Acute hypoxemic respiratory failure due to COVID-19 (HCC)    Asthma    Bronchiectasis (HCC)    Chicken pox    Diabetes (HCC)    GERD (gastroesophageal  reflux disease)    Hypertension    IBS (irritable bowel syndrome)    Major depressive disorder    Mixed hyperlipidemia    Neuromuscular disorder (HCC)    peripheral neuropathy   Neuropathy    Osteoarthritis    Pneumonia    x2   Primary insomnia    Primary osteoarthritis of left knee 05/31/2019   RLS (restless legs syndrome)    S/P total knee replacement 07/15/2019   Sleep apnea    does not use CPAP   Status post total knee replacement 07/25/2019   Tachycardia    Urge incontinence    UTI (lower urinary tract infection)    Weakness     Past Surgical History:  Procedure Laterality Date   ABDOMINAL HYSTERECTOMY     APPENDECTOMY     BREAST BIOPSY Right 04/26/2023   US  RT BREAST BX W LOC DEV 1ST LESION IMG BX SPEC US  GUIDE 04/26/2023 [facility]   BREAST BIOPSY  09/12/2023   US  RT RADIOACTIVE SEED LOC 09/12/2023 [facility]   BREAST LUMPECTOMY     CHOLECYSTECTOMY     EXCISION OF BREAST BIOPSY Right 09/13/2023   Procedure: EXCISION OF BREAST BIOPSY WITH RADIOACTIVE SEED GUIDED LOCALIZATION;  Surgeon: Belinda Cough, MD;  Location: [facility] OR;  Service: General;  Laterality: Right;  Right breast rsl excisional biopsy   HERNIA REPAIR     REPLACEMENT TOTAL KNEE Right    TONSILLECTOMY     TOTAL KNEE ARTHROPLASTY Left 07/15/2019   Procedure: TOTAL KNEE ARTHROPLASTY;  Surgeon: Rubie Kemps, MD;  Location: [facility];  Service: Orthopedics;  Laterality: Left;   TUBAL LIGATION      Family History  Problem Relation Age of Onset   Thyroid  disease Mother    Cancer Mother    Migraines Mother    Brain cancer Father    Throat cancer Father    Cancer - Other Father    Heart disease Maternal Grandmother    Diabetes Daughter     Social History:  reports that she quit smoking about 13 years ago. Her smoking use included cigarettes. She started smoking about 25 years ago. She has a 24 pack-year smoking history. She has never used smokeless tobacco. She reports that she does not  currently use alcohol. She reports that she does not use drugs.The patient is accompanied by her husband Alm today.  Allergies: No Known Allergies  Current Medications: Current Outpatient Medications  Medication Sig Dispense Refill   amitriptyline  (ELAVIL ) 25 MG tablet Take 1 tablet (25 mg total) by mouth every evening. 90 tablet 1   ASPIRIN  81 PO Take 81 mg by mouth daily.     Blood Glucose Monitoring Suppl DEVI 1 each by Does not apply route daily. E11.9 May substitute to any manufacturer covered by patient's insurance. 1 each 0   Blood Glucose Monitoring Suppl DEVI 1  each by Does not apply route in the morning, at noon, and at bedtime. May substitute to any manufacturer covered by patient's insurance. 1 each 0   budeson-glycopyrrolate-formoterol  (BREZTRI  AEROSPHERE) 160-9-4.8 MCG/ACT AERO Inhale 2 puffs into the lungs in the morning and at bedtime.     carbamide peroxide (DEBROX) 6.5 % OTIC solution Place 5 drops into the left ear 2 (two) times daily. 15 mL 0   Cyanocobalamin  (VITAMIN B-12) 2500 MCG SUBL Place 1 tablet (2,500 mcg total) under the tongue daily. DISSOLVE 1 TABLET UNDER THE TONGUE DAILY 90 tablet 3   fluticasone  (FLONASE ) 50 MCG/ACT nasal spray SPRAY 2 SPRAYS INTO EACH NOSTRIL EVERY DAY (Patient taking differently: Place 2 sprays into both nostrils daily as needed for allergies or rhinitis.) 48 g 2   glucose blood (ONETOUCH VERIO) test strip E11.42 Use new test strip each time when checking FBS 100 each 2   hydrOXYzine  (VISTARIL ) 25 MG capsule TAKE 1 CAPSULE (25 MG TOTAL) BY MOUTH AT BEDTIME AS NEEDED. 90 capsule 0   icosapent  Ethyl (VASCEPA ) 1 g capsule TAKE 2 CAPSULES BY MOUTH TWICE  DAILY 120 capsule 11   ipratropium-albuterol  (DUONEB) 0.5-2.5 (3) MG/3ML SOLN Take 3 mLs by nebulization every 6 (six) hours as needed (SOB. WHEEZING). 360 mL 1   Lancets (ONETOUCH DELICA PLUS LANCET33G) MISC E 11.42 Use new lancet each time when checking FBS 100 each 2   loratadine  (CLARITIN ) 10  MG tablet Take 1 tablet (10 mg total) by mouth daily. (Patient taking differently: Take 10 mg by mouth daily as needed for allergies.) 90 tablet 3   Multiple Vitamin (MULTIVITAMIN WITH MINERALS) TABS tablet Take 1 tablet by mouth daily.     ondansetron  (ZOFRAN -ODT) 4 MG disintegrating tablet Take 4 mg by mouth every 6 (six) hours as needed for nausea or vomiting.     pantoprazole  (PROTONIX ) 40 MG tablet Take 40 mg by mouth daily as needed (Indigestion).     phentermine  (ADIPEX-P ) 37.5 MG tablet Take 1 tablet (37.5 mg total) by mouth every morning. 30 tablet 0   pregabalin  (LYRICA ) 300 MG capsule Take 1 capsule (300 mg total) by mouth 2 (two) times daily. 60 capsule 2   rosuvastatin  (CRESTOR ) 40 MG tablet TAKE 1 TABLET BY MOUTH EVERY DAY 90 tablet 0   Semaglutide , 2 MG/DOSE, 8 MG/3ML SOPN Inject 2 mg as directed once a week. 3 mL 2   solifenacin  (VESICARE ) 5 MG tablet TAKE 1 TABLET (5 MG TOTAL) BY MOUTH DAILY. 90 tablet 0   SYMBICORT  160-4.5 MCG/ACT inhaler INHALE 2 PUFFS INTO THE LUNGS TWICE A DAY 10.2 each 3   topiramate  (TOPAMAX ) 50 MG tablet Take 1 tablet (50 mg total) by mouth daily. 90 tablet 0   vortioxetine  HBr (TRINTELLIX ) 20 MG TABS tablet Take 1 tablet (20 mg total) by mouth daily. 90 tablet 0   Albuterol -Budesonide  (AIRSUPRA ) 90-80 MCG/ACT AERO Inhale 2 puffs into the lungs 4 (four) times daily as needed. 10.7 g 3   anastrozole  (ARIMIDEX ) 1 MG tablet Take 1 tablet (1 mg total) by mouth daily. 30 tablet 5   dicyclomine  (BENTYL ) 20 MG tablet TAKE 1 TABLET (20 MG TOTAL) BY MOUTH 4 (FOUR) TIMES DAILY AS NEEDED (INTESTINAL SPASMS). 360 tablet 1   Eszopiclone  3 MG TABS Take 1 tablet (3 mg total) by mouth at bedtime. 30 tablet 2   ezetimibe  (ZETIA ) 10 MG tablet TAKE 1 TABLET BY MOUTH EVERY DAY 90 tablet 1   omeprazole  (PRILOSEC) 20 MG capsule TAKE  1 CAPSULE BY MOUTH EVERY DAY 90 capsule 1   ramipril  (ALTACE ) 2.5 MG capsule TAKE 1 CAPSULE BY MOUTH EVERY DAY 90 capsule 1   rOPINIRole  (REQUIP ) 3  MG tablet TAKE 1 TABLET (3 MG TOTAL) BY MOUTH DAILY 1-3 HOURS BEFORE BEDTIME. 90 tablet 0   No current facility-administered medications for this visit.    REVIEW OF SYSTEMS:  Review of Systems  Constitutional: Negative.  Negative for appetite change, chills, fever and unexpected weight change.  HENT:  Negative.  Negative for lump/mass, mouth sores and sore throat.   Eyes: Negative.   Respiratory: Negative.  Negative for chest tightness, cough, hemoptysis, shortness of breath and wheezing.   Cardiovascular: Negative.  Negative for chest pain, leg swelling and palpitations.  Gastrointestinal: Negative.  Negative for abdominal distention, abdominal pain, blood in stool, constipation, diarrhea, nausea and vomiting.  Endocrine: Negative.   Genitourinary: Negative.  Negative for difficulty urinating, dysuria, frequency and hematuria.   Musculoskeletal: Negative.  Negative for arthralgias, back pain, flank pain and gait problem.  Skin: Negative.  Negative for itching, rash and wound.  Neurological:  Negative for dizziness, extremity weakness, gait problem, headaches, light-headedness, numbness, seizures and speech difficulty.  Hematological: Negative.  Negative for adenopathy. Does not bruise/bleed easily.  Psychiatric/Behavioral: Negative.  Negative for depression and sleep disturbance. The patient is not nervous/anxious.    VITALS:  Blood pressure 127/60, pulse 76, temperature 98 F (36.7 C), temperature source Oral, resp. rate 16, height 5' 3 (1.6 m), weight 174 lb 4.8 oz (79.1 kg), SpO2 99%.  Wt Readings from Last 3 Encounters:  10/09/23 172 lb (78 kg)  09/28/23 174 lb 4.8 oz (79.1 kg)  09/13/23 172 lb (78 kg)    Body mass index is 30.88 kg/m.  Performance status (ECOG): 0 - Asymptomatic  PHYSICAL EXAM:  Physical Exam Vitals and nursing note reviewed. Exam conducted with a chaperone present.  Constitutional:      General: She is not in acute distress.    Appearance: Normal  appearance. She is normal weight.  HENT:     Head: Normocephalic and atraumatic.     Right Ear: Tympanic membrane, ear canal and external ear normal. There is no impacted cerumen.     Left Ear: Tympanic membrane, ear canal and external ear normal. There is no impacted cerumen.     Nose: Nose normal. No congestion or rhinorrhea.     Mouth/Throat:     Mouth: Mucous membranes are moist.     Pharynx: Oropharynx is clear. No oropharyngeal exudate or posterior oropharyngeal erythema.  Eyes:     General: No scleral icterus.       Right eye: No discharge.        Left eye: No discharge.     Extraocular Movements: Extraocular movements intact.     Conjunctiva/sclera: Conjunctivae normal.     Pupils: Pupils are equal, round, and reactive to light.  Cardiovascular:     Rate and Rhythm: Normal rate and regular rhythm.     Pulses: Normal pulses.     Heart sounds: Normal heart sounds. No murmur heard.    No friction rub. No gallop.  Pulmonary:     Effort: Pulmonary effort is normal. No respiratory distress.     Breath sounds: Normal breath sounds.  Chest:     Comments: Well healed scar in the lateral right breast at about 9 o'clock with some mild swelling around it.  Prominent venous pattern in the upper left chest wall Abdominal:  General: Bowel sounds are normal. There is no distension.     Palpations: Abdomen is soft. There is no hepatomegaly, splenomegaly or mass.     Tenderness: There is no abdominal tenderness. There is no right CVA tenderness, left CVA tenderness, guarding or rebound.     Hernia: No hernia is present.  Musculoskeletal:        General: Normal range of motion.     Cervical back: Normal range of motion and neck supple.     Right lower leg: No edema.     Left lower leg: No edema.  Lymphadenopathy:     Cervical: No cervical adenopathy.     Right cervical: No superficial, deep or posterior cervical adenopathy.    Left cervical: No superficial, deep or posterior cervical  adenopathy.     Upper Body:     Right upper body: No supraclavicular, axillary or pectoral adenopathy.     Left upper body: No supraclavicular, axillary or pectoral adenopathy.  Skin:    General: Skin is warm and dry.  Neurological:     General: No focal deficit present.     Mental Status: She is alert and oriented to person, place, and time. Mental status is at baseline.  Psychiatric:        Mood and Affect: Mood normal.        Behavior: Behavior normal.        Thought Content: Thought content normal.        Judgment: Judgment normal.      LABS:      Latest Ref Rng & Units 09/28/2023    2:26 PM 08/23/2023   11:24 AM 07/05/2023   11:21 AM  CBC  WBC 4.0 - 10.5 K/uL 8.4  7.6  9.6   Hemoglobin 12.0 - 15.0 g/dL 86.5  86.2  85.1   Hematocrit 36.0 - 46.0 % 41.3  44.7  45.7   Platelets 150 - 400 K/uL 226  233  271       Latest Ref Rng & Units 09/28/2023    2:26 PM 08/23/2023   11:24 AM 07/05/2023   11:21 AM  CMP  Glucose 70 - 99 mg/dL 890  98  92   BUN 8 - 23 mg/dL 23  17  10    Creatinine 0.44 - 1.00 mg/dL 8.96  9.20  9.30   Sodium 135 - 145 mmol/L 137  134  138   Potassium 3.5 - 5.1 mmol/L 3.9  4.9  5.2   Chloride 98 - 111 mmol/L 102  100  100   CO2 22 - 32 mmol/L 22  20  21    Calcium  8.9 - 10.3 mg/dL 9.1  9.0  9.7   Total Protein 6.5 - 8.1 g/dL 6.5  6.0  6.4   Total Bilirubin 0.0 - 1.2 mg/dL 0.3  0.4  0.4   Alkaline Phos 38 - 126 U/L 92  90  84   AST 15 - 41 U/L 14  15  26    ALT 0 - 44 U/L 8  10  20       No results found for: CEA1, CEA / No results found for: CEA1, CEA No results found for: PSA1 No results found for: CAN199 No results found for: RJW874  Lab Results  Component Value Date   TOTALPROTELP 6.8 03/11/2014   ALBUMINELP 56.1 03/11/2014   A1GS 5.6 (H) 03/11/2014   A2GS 13.4 (H) 03/11/2014   BETS 7.1 03/11/2014   BETA2SER 6.0 03/11/2014   GAMS  11.8 03/11/2014   MSPIKE NOT DET 03/11/2014   SPEI SEE NOTE 03/11/2014   Lab Results  Component  Value Date   FERRITIN 321 (H) 01/04/2019   FERRITIN 326 (H) 01/03/2019   FERRITIN 353 (H) 01/02/2019   Lab Results  Component Value Date   LDH 152 12/31/2018    STUDIES:  No results found.   Pathology: 09/13/2023 A. BREAST, RIGHT, LUMPECTOMY:       Invasive papillary carcinoma, 0.9 cm, grade 2       Margins: All margins negative for invasive carcinoma  Closest, invasive:  0.5 mm from posterior margin  Lymphovascular invasion:  Not identified  Estrogen Receptor:  100% positive Progesterone Receptor:  100% positive Proliferation Marker Ki-67:  2%  HER2 (0) negative  EXAM: 07/01/2023 MRI HEAD WITHOUT AND WITH CONTRAST IMPRESSION: 1. No acute intracranial abnormality or significant interval change. 2. Central pontine T2 hyperintensity without associated enhancement. This may be related to chronic microvascular ischemia. Benign venous lesion is also considered. No other significant white matter disease is present. 3. Small mastoid effusions bilaterally. No obstructing nasopharyngeal lesion is present. 4. Polyp or mucous retention cyst of the left maxillary sinus.  Exam: 02/21/2023 Digital Diagnostic Unilateral Right Mammogram with Tomosynthesis and CAD; Ultrasound of the Right Breast Impression: There is an indeterminate 7mm in the right breast at 10 o'clock. No evidence of right axillary lymphadenopathy  EXAM: 02/01/2024 DIGITAL SCREENING BILATERAL MAMMOGRAM WITH TOMOSYNTHESIS AND CAD IMPRESSION: Further evaluation is suggested for a possible mass in the right breast. RECOMMENDATION: Diagnostic mammogram and possibly ultrasound of the right breast. (Code:FI-R-101M)   I,Jasmine M Lassiter,acting as a scribe for Hailey VEAR Cornish, MD.,have documented all relevant documentation on the behalf of Hailey VEAR Cornish, MD,as directed by  Hailey VEAR Cornish, MD while in the presence of Hailey VEAR Cornish, MD.

## 2023-09-28 NOTE — Progress Notes (Signed)
 Face to face contact with pt here for Med Onc consult with Dr. Cornelius. Pt has had surgery and is here to discuss the next steps. The Breast Cancer Treatment Handbook given. Navigation contact info given. Encouraged pt to call with questions or concerns.

## 2023-09-28 NOTE — Progress Notes (Signed)
 Complex Care Management Care Guide Note  09/28/2023 Name: NERIA PROCTER MRN: [id number] DOB: 12/18/1950  GENNIFER POTENZA is a 73 y.o. year old female who is a primary care patient of Cox, Kirsten, MD and is actively engaged with the care management team. I reached out to Rollo DELENA Public by phone today to assist with re-scheduling  with the RN Case Manager.  Follow up plan: Telephone appointment with complex care management team member scheduled for:  7/17/5 at 3:00 p.m.   Dreama [facility]  [facility], [facility] Health Care Management Assistant Direct Dial: [phone]  Fax: [phone]

## 2023-09-28 NOTE — Telephone Encounter (Signed)
 Patient has been scheduled for follow-up visit per 09/28/23 LOS.  Pt given an appt calendar with date and time.

## 2023-10-02 ENCOUNTER — Other Ambulatory Visit: Payer: Self-pay | Admitting: Licensed Clinical Social Worker

## 2023-10-02 NOTE — Patient Instructions (Signed)
 Visit Information  Thank you for taking time to visit with me today. Please don't hesitate to contact me if I can be of assistance to you before our next scheduled appointment.  Our next appointment is by telephone on 10/23/2023 at 9:30 am Please call the care guide team at [phone] if you need to cancel or reschedule your appointment.   Following is a copy of your care plan:   Goals Addressed             This Visit's Progress    BSW VBCI Social Work Care Plan       Problems:   Air traffic controller Insecurity   CSW Clinical Goal(s):   Over the next 3 weeks the Patient will will follow up with the resources for the food pantry and rental assistance as directed by Social Work.  Interventions:  SW will mail resources   Patient Goals/Self-Care Activities:  Review resources mailed  Plan:   Telephone follow up appointment with care management team member scheduled for:  09/27/2023 at 9:30 am        Please call the Suicide and Crisis Lifeline: 988 go to [facility] Urgent [facility] [address] [place] [phone]) call 911 if you are experiencing a Mental Health or Behavioral Health Crisis or need someone to talk to.  Patient verbalizes understanding of instructions and care plan provided today and agrees to view in MyChart. Active MyChart status and patient understanding of how to access instructions and care plan via MyChart confirmed with patient.     Tobias CHARM [name] [name], PhD [facility], [facility] Social Worker Direct Dial: [phone]  Fax: [phone]

## 2023-10-02 NOTE — Patient Outreach (Signed)
 Complex Care Management   Visit Note  10/02/2023  Name:  Hailey Greene MRN: [id number] DOB: Mar 11, 1951  Situation: Referral received for Complex Care Management related to SDOH Barriers:  Food insecurity Financial Resource Strain I obtained verbal consent from Patient.  Visit completed with patient  on the phone  Background:   Past Medical History:  Diagnosis Date   2019 novel coronavirus disease (COVID-19) 12/31/2018   Acute hypoxemic respiratory failure due to COVID-19 (HCC)    Asthma    Bronchiectasis (HCC)    Chicken pox    Diabetes (HCC)    GERD (gastroesophageal reflux disease)    Hypertension    IBS (irritable bowel syndrome)    Major depressive disorder    Mixed hyperlipidemia    Neuromuscular disorder (HCC)    peripheral neuropathy   Neuropathy    Osteoarthritis    Pneumonia    x2   Primary insomnia    Primary osteoarthritis of left knee 05/31/2019   RLS (restless legs syndrome)    S/P total knee replacement 07/15/2019   Sleep apnea    does not use CPAP   Status post total knee replacement 07/25/2019   Tachycardia    Urge incontinence    UTI (lower urinary tract infection)    Weakness     Assessment: Patient is getting SS between she and her husband of $1700 per month an has rent that is $910 but is going up to $990, adult son lives there and does assist with about $500 each month. Patient are a half a month behind on rent. Patient does receive $200 in food stamps and get [facility] [employer] ($326 that she uses for utilities)  SDOH Interventions    Flowsheet Row Patient Outreach Telephone from 10/02/2023 in [place] [facility] Office Visit from 09/28/2023 in [facility] [place] Office Visit from 07/05/2023 in [facility] Telephone from 07/03/2023 in [place] [facility] DEPARTMENT Office Visit from 06/19/2023 in [facility] Office Visit from 03/08/2023 in  [facility]  SDOH Interventions        Food Insecurity Interventions Intervention Not Indicated  [Will send food pantry list] Intervention Not Indicated -- Intervention Not Indicated Intervention Not Indicated Intervention Not Indicated  Housing Interventions Intervention Not Indicated Intervention Not Indicated -- Intervention Not Indicated Intervention Not Indicated Intervention Not Indicated  Transportation Interventions Intervention Not Indicated Intervention Not Indicated -- Intervention Not Indicated Intervention Not Indicated Intervention Not Indicated  Utilities Interventions Intervention Not Indicated Intervention Not Indicated -- Intervention Not Indicated Intervention Not Indicated Intervention Not Indicated  Alcohol Usage Interventions -- Intervention Not Indicated (Score <7) Intervention Not Indicated (Score <7) -- -- Intervention Not Indicated (Score <7)  Financial Strain Interventions Intervention Not Indicated -- -- -- -- Intervention Not Indicated  Physical Activity Interventions -- -- -- -- -- Intervention Not Indicated  Stress Interventions -- Intervention Not Indicated -- -- -- Intervention Not Indicated  Social Connections Interventions -- -- Intervention Not Indicated -- -- Intervention Not Indicated  Health Literacy Interventions -- -- Intervention Not Indicated -- -- Intervention Not Indicated       Recommendation:   none  Follow Up Plan:   Telephone follow up appointment date/time:  10/23/2023 at 9:30 am  Tobias CHARM [name] [name], PhD [facility], [facility] Social Worker Direct Dial: [phone]  Fax: [phone]

## 2023-10-05 ENCOUNTER — Other Ambulatory Visit: Payer: Self-pay

## 2023-10-05 NOTE — Progress Notes (Signed)
 This information is for the sake of discussion at tumor board and not an official part of the treatment plan.

## 2023-10-06 ENCOUNTER — Other Ambulatory Visit: Payer: Self-pay | Admitting: Family Medicine

## 2023-10-09 ENCOUNTER — Ambulatory Visit (INDEPENDENT_AMBULATORY_CARE_PROVIDER_SITE_OTHER): Admitting: Family Medicine

## 2023-10-09 ENCOUNTER — Encounter: Payer: Self-pay | Admitting: Family Medicine

## 2023-10-09 ENCOUNTER — Ambulatory Visit: Payer: Self-pay | Admitting: Family Medicine

## 2023-10-09 VITALS — BP 122/78 | HR 78 | Temp 98.3°F | Ht 63.0 in | Wt 172.0 lb

## 2023-10-09 DIAGNOSIS — R0603 Acute respiratory distress: Secondary | ICD-10-CM | POA: Diagnosis not present

## 2023-10-09 DIAGNOSIS — R0602 Shortness of breath: Secondary | ICD-10-CM | POA: Insufficient documentation

## 2023-10-09 DIAGNOSIS — R7989 Other specified abnormal findings of blood chemistry: Secondary | ICD-10-CM | POA: Diagnosis not present

## 2023-10-09 DIAGNOSIS — R051 Acute cough: Secondary | ICD-10-CM | POA: Diagnosis not present

## 2023-10-09 LAB — POC INFLUENZA A&B (BINAX/QUICKVUE)
Influenza A, POC: NEGATIVE
Influenza B, POC: NEGATIVE

## 2023-10-09 LAB — POCT RAPID STREP A (OFFICE): Rapid Strep A Screen: NEGATIVE

## 2023-10-09 LAB — LAB REPORT - SCANNED: EGFR: 78

## 2023-10-09 LAB — POC COVID19 BINAXNOW: SARS Coronavirus 2 Ag: NEGATIVE

## 2023-10-09 LAB — POCT RESPIRATORY SYNCYTIAL VIRUS: RSV Rapid Ag: NEGATIVE

## 2023-10-09 MED ORDER — ESZOPICLONE 3 MG PO TABS: 3.0000 mg | ORAL_TABLET | Freq: Every day | ORAL | 2 refills | Status: DC

## 2023-10-09 MED ORDER — AIRSUPRA 90-80 MCG/ACT IN AERO: 2.0000 | INHALATION_SPRAY | Freq: Four times a day (QID) | RESPIRATORY_TRACT | 3 refills | Status: AC | PRN

## 2023-10-09 NOTE — Assessment & Plan Note (Signed)
 Check xray.  Neg covid, rsv.

## 2023-10-09 NOTE — Assessment & Plan Note (Signed)
 Patient is maintaining oxygenation through increased RR and Tachcyardia.

## 2023-10-09 NOTE — Assessment & Plan Note (Signed)
 Improved with oxygen .  Lung sounds normal, but clearly in distress.  Ordered blood count, chemistry panel (liver and kidney function), d dimer.

## 2023-10-09 NOTE — Progress Notes (Deleted)
 Subjective:  Patient ID: Hailey Greene, female    DOB: Apr 04, 1950  Age: 73 y.o. MRN: [id number]  No chief complaint on file.   HPI:     09/28/2023    4:07 PM 09/28/2023    4:01 PM 08/23/2023   10:25 AM 07/11/2023    1:25 PM 04/27/2023    8:17 AM  Depression screen PHQ 2/9  Decreased Interest 0 0 0 0 0  Down, Depressed, Hopeless 0 0 0 0 0  PHQ - 2 Score 0 0 0 0 0  Altered sleeping     3  Tired, decreased energy     0  Change in appetite     0  Feeling bad or failure about yourself      0  Trouble concentrating     0  Moving slowly or fidgety/restless     0  Suicidal thoughts     0  PHQ-9 Score     3        08/23/2023   10:25 AM  Fall Risk   Falls in the past year? 1  Number falls in past yr: 1  Injury with Fall? 0  Risk for fall due to : History of fall(s)  Follow up Falls evaluation completed    Patient Care Team: Sherre Clapper, MD as PCP - General (Family Medicine) Rubie Kemps, MD as Consulting Physician (Orthopedic Surgery) Corlis Joen BIRCH, RN as Oncology Nurse Navigator [name] Lister D as Social Worker   Review of Systems  Current Outpatient Medications on File Prior to Visit  Medication Sig Dispense Refill   Albuterol -Budesonide  (AIRSUPRA ) 90-80 MCG/ACT AERO Inhale 2 puffs into the lungs 4 (four) times daily as needed. 10.7 g 3   amitriptyline  (ELAVIL ) 25 MG tablet Take 1 tablet (25 mg total) by mouth every evening. 90 tablet 1   anastrozole  (ARIMIDEX ) 1 MG tablet Take 1 tablet (1 mg total) by mouth daily. 30 tablet 5   ASPIRIN  81 PO Take 81 mg by mouth daily.     Blood Glucose Monitoring Suppl DEVI 1 each by Does not apply route daily. E11.9 May substitute to any manufacturer covered by patient's insurance. 1 each 0   Blood Glucose Monitoring Suppl DEVI 1 each by Does not apply route in the morning, at noon, and at bedtime. May substitute to any manufacturer covered by patient's insurance. 1 each 0   budeson-glycopyrrolate-formoterol  (BREZTRI  AEROSPHERE)  160-9-4.8 MCG/ACT AERO Inhale 2 puffs into the lungs in the morning and at bedtime.     carbamide peroxide (DEBROX) 6.5 % OTIC solution Place 5 drops into the left ear 2 (two) times daily. 15 mL 0   Cyanocobalamin  (VITAMIN B-12) 2500 MCG SUBL Place 1 tablet (2,500 mcg total) under the tongue daily. DISSOLVE 1 TABLET UNDER THE TONGUE DAILY 90 tablet 3   dicyclomine  (BENTYL ) 20 MG tablet Take 1 tablet (20 mg total) by mouth 4 (four) times daily as needed (intestinal spasms). 120 tablet 1   Eszopiclone  3 MG TABS TAKE 1 TABLET BY MOUTH AT BEDTIME. 30 tablet 2   ezetimibe  (ZETIA ) 10 MG tablet Take 1 tablet (10 mg total) by mouth daily. 90 tablet 0   fluticasone  (FLONASE ) 50 MCG/ACT nasal spray SPRAY 2 SPRAYS INTO EACH NOSTRIL EVERY DAY (Patient taking differently: Place 2 sprays into both nostrils daily as needed for allergies or rhinitis.) 48 g 2   glucose blood (ONETOUCH VERIO) test strip E11.42 Use new test strip each time when checking FBS 100 each  2   hydrOXYzine  (VISTARIL ) 25 MG capsule TAKE 1 CAPSULE (25 MG TOTAL) BY MOUTH AT BEDTIME AS NEEDED. 90 capsule 0   icosapent  Ethyl (VASCEPA ) 1 g capsule TAKE 2 CAPSULES BY MOUTH TWICE  DAILY 120 capsule 11   ipratropium-albuterol  (DUONEB) 0.5-2.5 (3) MG/3ML SOLN Take 3 mLs by nebulization every 6 (six) hours as needed (SOB. WHEEZING). 360 mL 1   Lancets (ONETOUCH DELICA PLUS LANCET33G) MISC E 11.42 Use new lancet each time when checking FBS 100 each 2   loratadine  (CLARITIN ) 10 MG tablet Take 1 tablet (10 mg total) by mouth daily. (Patient taking differently: Take 10 mg by mouth daily as needed for allergies.) 90 tablet 3   Multiple Vitamin (MULTIVITAMIN WITH MINERALS) TABS tablet Take 1 tablet by mouth daily.     omeprazole  (PRILOSEC) 20 MG capsule Take 20 mg by mouth daily as needed (Indigestion).     ondansetron  (ZOFRAN -ODT) 4 MG disintegrating tablet Take 4 mg by mouth every 6 (six) hours as needed for nausea or vomiting.     pantoprazole  (PROTONIX ) 40  MG tablet Take 40 mg by mouth daily as needed (Indigestion).     phentermine  (ADIPEX-P ) 37.5 MG tablet Take 1 tablet (37.5 mg total) by mouth every morning. 30 tablet 0   pregabalin  (LYRICA ) 300 MG capsule Take 1 capsule (300 mg total) by mouth 2 (two) times daily. 60 capsule 2   ramipril  (ALTACE ) 2.5 MG capsule Take 1 capsule (2.5 mg total) by mouth daily. 90 capsule 0   rOPINIRole  (REQUIP ) 3 MG tablet Take 1 tablet (3 mg total) by mouth daily 1-3 hours before bedtime. 90 tablet 0   rosuvastatin  (CRESTOR ) 40 MG tablet TAKE 1 TABLET BY MOUTH EVERY DAY 90 tablet 0   Semaglutide , 2 MG/DOSE, 8 MG/3ML SOPN Inject 2 mg as directed once a week. 3 mL 2   solifenacin  (VESICARE ) 5 MG tablet TAKE 1 TABLET (5 MG TOTAL) BY MOUTH DAILY. 90 tablet 0   SYMBICORT  160-4.5 MCG/ACT inhaler INHALE 2 PUFFS INTO THE LUNGS TWICE A DAY 10.2 each 3   topiramate  (TOPAMAX ) 50 MG tablet Take 1 tablet (50 mg total) by mouth daily. 90 tablet 0   vortioxetine  HBr (TRINTELLIX ) 20 MG TABS tablet Take 1 tablet (20 mg total) by mouth daily. 90 tablet 0   No current facility-administered medications on file prior to visit.   Past Medical History:  Diagnosis Date   2019 novel coronavirus disease (COVID-19) 12/31/2018   Acute hypoxemic respiratory failure due to COVID-19 (HCC)    Asthma    Bronchiectasis (HCC)    Chicken pox    Diabetes (HCC)    GERD (gastroesophageal reflux disease)    Hypertension    IBS (irritable bowel syndrome)    Major depressive disorder    Mixed hyperlipidemia    Neuromuscular disorder (HCC)    peripheral neuropathy   Neuropathy    Osteoarthritis    Pneumonia    x2   Primary insomnia    Primary osteoarthritis of left knee 05/31/2019   RLS (restless legs syndrome)    S/P total knee replacement 07/15/2019   Sleep apnea    does not use CPAP   Status post total knee replacement 07/25/2019   Tachycardia    Urge incontinence    UTI (lower urinary tract infection)    Weakness    Past  Surgical History:  Procedure Laterality Date   ABDOMINAL HYSTERECTOMY     APPENDECTOMY     BREAST BIOPSY Right 04/26/2023  US  RT BREAST BX W LOC DEV 1ST LESION IMG BX SPEC US  GUIDE 04/26/2023 [facility]MO[facility]   BREAST BIOPSY  09/12/2023   US  RT RADIOACTIVE SEED LOC 09/12/2023 [facility]MO[facility]   BREAST LUMPECTOMY     CHOLECYSTECTOMY     EXCISION OF BREAST BIOPSY Right 09/13/2023   Procedure: EXCISION OF BREAST BIOPSY WITH RADIOACTIVE SEED GUIDED LOCALIZATION;  Surgeon: Belinda Cough, MD;  Location: [facility] OR;  Service: General;  Laterality: Right;  Right breast rsl excisional biopsy   HERNIA REPAIR     REPLACEMENT TOTAL KNEE Right    TONSILLECTOMY     TOTAL KNEE ARTHROPLASTY Left 07/15/2019   Procedure: TOTAL KNEE ARTHROPLASTY;  Surgeon: Rubie Kemps, MD;  Location: [facility];  Service: Orthopedics;  Laterality: Left;   TUBAL LIGATION      Family History  Problem Relation Age of Onset   Thyroid  disease Mother    Cancer Mother    Migraines Mother    Brain cancer Father    Throat cancer Father    Cancer - Other Father    Heart disease Maternal Grandmother    Diabetes Daughter    Social History   Socioeconomic History   Marital status: Married    Spouse name: Alm   Number of children: 3   Years of education: 12   Highest education level: Not on file  Occupational History   Occupation: Housewife  Tobacco Use   Smoking status: Former    Current packs/day: 0.00    Average packs/day: 2.0 packs/day for 12.0 years (24.0 ttl pk-yrs)    Types: Cigarettes    Start date: 05/25/1998    Quit date: 05/25/2010    Years since quitting: 13.3   Smokeless tobacco: Never   Tobacco comments:    Used to smoke 1-2 packs a days  Vaping Use   Vaping status: Never Used  Substance and Sexual Activity   Alcohol use: Not Currently    Comment: past occasionally   Drug use: Never   Sexual activity: Not Currently    Birth control/protection: Surgical    Comment: hyst  Other Topics Concern    Not on file  Social History Narrative   Born and raised in [place], [place]. Currently lives in a house with her husband. 1 dog. Fun: Garden, feed birds, swimming   Denies any religious beliefs effecting health care.    Social Drivers of Health   Financial Resource Strain: Medium Risk (10/02/2023)   Overall Financial Resource Strain (CARDIA)    Difficulty of Paying Living Expenses: Somewhat hard  Food Insecurity: No Food Insecurity (10/02/2023)   Hunger Vital Sign    Worried About Running Out of Food in the Last Year: Never true    Ran Out of Food in the Last Year: Never true  Recent Concern: Food Insecurity - Food Insecurity Present (08/19/2023)   Hunger Vital Sign    Worried About Running Out of Food in the Last Year: Sometimes true    Ran Out of Food in the Last Year: Sometimes true  Transportation Needs: No Transportation Needs (10/02/2023)   PRAPARE - Administrator, Civil Service (Medical): No    Lack of Transportation (Non-Medical): No  Physical Activity: Insufficiently Active (08/19/2023)   Exercise Vital Sign    Days of Exercise per Week: 4 days    Minutes of Exercise per Session: 20 min  Stress: No Stress Concern Present (09/28/2023)   [name] of Occupational Health - Occupational Stress Questionnaire  Feeling of Stress: Not at all  Recent Concern: Stress - Stress Concern Present (08/19/2023)   [name] of Occupational Health - Occupational Stress Questionnaire    Feeling of Stress : Rather much  Social Connections: Socially Integrated (08/19/2023)   Social Connection and Isolation Panel    Frequency of Communication with Friends and Family: More than three times a week    Frequency of Social Gatherings with Friends and Family: Three times a week    Attends Religious Services: More than 4 times per year    Active Member of Clubs or Organizations: Yes    Attends Banker Meetings: More than 4 times per year    Marital Status: Married     Objective:  There were no vitals taken for this visit.     09/28/2023    3:55 PM 09/13/2023   11:45 AM 09/13/2023   11:30 AM  BP/Weight  Systolic BP 127 115 118  Diastolic BP 60 79 57  Wt. (Lbs) 174.3    BMI 30.88 kg/m2      Physical Exam  {Perform Simple Foot Exam  Perform Detailed exam:1} {Insert foot Exam (Optional):30965}   Lab Results  Component Value Date   WBC 8.4 09/28/2023   HGB 13.4 09/28/2023   HCT 41.3 09/28/2023   PLT 226 09/28/2023   GLUCOSE 109 (H) 09/28/2023   CHOL 241 (H) 08/23/2023   TRIG 122 08/23/2023   HDL 61 08/23/2023   LDLCALC 158 (H) 08/23/2023   ALT 8 09/28/2023   AST 14 (L) 09/28/2023   NA 137 09/28/2023   K 3.9 09/28/2023   CL 102 09/28/2023   CREATININE 1.03 (H) 09/28/2023   BUN 23 09/28/2023   CO2 22 09/28/2023   TSH 0.966 08/23/2023   HGBA1C 5.6 08/23/2023   MICROALBUR 30 09/25/2020      Assessment & Plan:  There are no diagnoses linked to this encounter.   No orders of the defined types were placed in this encounter.   No orders of the defined types were placed in this encounter.    Follow-up: No follow-ups on file.   I,Mikenzi Raysor A Sheryle Vice,acting as a scribe for Abigail Free, MD.,have documented all relevant documentation on the behalf of Abigail Free, MD,as directed by  Abigail Free, MD while in the presence of Abigail Free, MD.   An After Visit Summary was printed and given to the patient.  Abigail Free, MD [facility] [phone]

## 2023-10-09 NOTE — Progress Notes (Signed)
 Acute Office Visit  Subjective:    Patient ID: Hailey Greene, female    DOB: Hailey Greene, 73 y.o.   MRN: [id number]  Chief Complaint  Patient presents with   URI    HPI: Patient is in today for Greene, sore throat, shortness of breath, and chills that started this past Saturday afternoon. Denies body aches/headache. Covid negative  Patient has a history of numerous hospitalizations in the last 8 months for pneumonia, asthma exacerbations, and bronchitis.  Strep, covid and rsv negative.   Past Medical History:  Diagnosis Date   2019 novel coronavirus disease (COVID-19) 12/31/2018   Acute hypoxemic respiratory failure due to COVID-19 (HCC)    Asthma    Bronchiectasis (HCC)    Chicken pox    Diabetes (HCC)    GERD (gastroesophageal reflux disease)    Hypertension    IBS (irritable bowel syndrome)    Major depressive disorder    Mixed hyperlipidemia    Neuromuscular disorder (HCC)    peripheral neuropathy   Neuropathy    Osteoarthritis    Pneumonia    x2   Primary insomnia    Primary osteoarthritis of left knee 05/31/2019   RLS (restless legs syndrome)    S/P total knee replacement 07/15/2019   Sleep apnea    does not use CPAP   Status post total knee replacement 07/25/2019   Tachycardia    Urge incontinence    UTI (lower urinary tract infection)    Weakness     Past Surgical History:  Procedure Laterality Date   ABDOMINAL HYSTERECTOMY     APPENDECTOMY     BREAST BIOPSY Right 04/26/2023   US  RT BREAST BX W LOC DEV 1ST LESION IMG BX SPEC US  GUIDE 04/26/2023 [facility]   BREAST BIOPSY  09/12/2023   US  RT RADIOACTIVE SEED LOC 09/12/2023 [facility]   BREAST LUMPECTOMY     CHOLECYSTECTOMY     EXCISION OF BREAST BIOPSY Right 09/13/2023   Procedure: EXCISION OF BREAST BIOPSY WITH RADIOACTIVE SEED GUIDED LOCALIZATION;  Surgeon: Hailey Cough, MD;  Location: [facility];  Service: General;  Laterality: Right;  Right breast rsl excisional biopsy   HERNIA  REPAIR     REPLACEMENT TOTAL KNEE Right    TONSILLECTOMY     TOTAL KNEE ARTHROPLASTY Left 07/15/2019   Procedure: TOTAL KNEE ARTHROPLASTY;  Surgeon: Hailey Kemps, MD;  Location: [facility];  Service: Orthopedics;  Laterality: Left;   TUBAL LIGATION      Family History  Problem Relation Age of Onset   Thyroid  disease Mother    Cancer Mother    Migraines Mother    Brain cancer Father    Throat cancer Father    Cancer - Other Father    Heart disease Maternal Grandmother    Diabetes Daughter     Social History   Socioeconomic History   Marital status: Married    Spouse name: Hailey Greene   Number of children: 3   Years of education: 12   Highest education level: Not on file  Occupational History   Occupation: Housewife  Tobacco Use   Smoking status: Former    Current packs/day: 0.00    Average packs/day: 2.0 packs/day for 12.0 years (24.0 ttl pk-yrs)    Types: Cigarettes    Start date: 05/25/1998    Quit date: 05/25/2010    Years since quitting: 13.3   Smokeless tobacco: Never   Tobacco comments:    Used to smoke 1-2 packs a days  Vaping  Use   Vaping status: Never Used  Substance and Sexual Activity   Alcohol use: Not Currently    Comment: past occasionally   Drug use: Never   Sexual activity: Not Currently    Birth control/protection: Surgical    Comment: hyst  Other Topics Concern   Not on file  Social History Narrative   Born and raised in [place], [place]. Currently lives in a house with her husband. 1 dog. Fun: Garden, feed birds, swimming   Denies any religious beliefs effecting health care.    Social Drivers of Health   Financial Resource Strain: Medium Risk (10/02/2023)   Overall Financial Resource Strain (CARDIA)    Difficulty of Paying Living Expenses: Somewhat hard  Food Insecurity: No Food Insecurity (10/02/2023)   Hunger Vital Sign    Worried About Running Out of Food in the Last Year: Never true    Ran Out of Food in the Last Year: Never true  Recent Concern: Food  Insecurity - Food Insecurity Present (08/19/2023)   Hunger Vital Sign    Worried About Running Out of Food in the Last Year: Sometimes true    Ran Out of Food in the Last Year: Sometimes true  Transportation Needs: No Transportation Needs (10/02/2023)   PRAPARE - Administrator, Civil Service (Medical): No    Lack of Transportation (Non-Medical): No  Physical Activity: Insufficiently Active (08/19/2023)   Exercise Vital Sign    Days of Exercise per Week: 4 days    Minutes of Exercise per Session: 20 min  Stress: No Stress Concern Present (09/28/2023)   [name] of Occupational Health - Occupational Stress Questionnaire    Feeling of Stress: Not at all  Recent Concern: Stress - Stress Concern Present (08/19/2023)   [name] of Occupational Health - Occupational Stress Questionnaire    Feeling of Stress : Rather much  Social Connections: Socially Integrated (08/19/2023)   Social Connection and Isolation Panel    Frequency of Communication with Friends and Family: More than three times a week    Frequency of Social Gatherings with Friends and Family: Three times a week    Attends Religious Services: More than 4 times per year    Active Member of Clubs or Organizations: Yes    Attends Banker Meetings: More than 4 times per year    Marital Status: Married  Catering manager Violence: Not At Risk (10/02/2023)   Humiliation, Afraid, Rape, and Kick questionnaire    Fear of Current or Ex-Partner: No    Emotionally Abused: No    Physically Abused: No    Sexually Abused: No    Outpatient Medications Prior to Visit  Medication Sig Dispense Refill   amitriptyline  (ELAVIL ) 25 MG tablet Take 1 tablet (25 mg total) by mouth every evening. 90 tablet 1   anastrozole  (ARIMIDEX ) 1 MG tablet Take 1 tablet (1 mg total) by mouth daily. 30 tablet 5   ASPIRIN  81 PO Take 81 mg by mouth daily.     Blood Glucose Monitoring Suppl DEVI 1 each by Does not apply route daily.  E11.9 May substitute to any manufacturer covered by patient's insurance. 1 each 0   Blood Glucose Monitoring Suppl DEVI 1 each by Does not apply route in the morning, at noon, and at bedtime. May substitute to any manufacturer covered by patient's insurance. 1 each 0   budeson-glycopyrrolate-formoterol  (BREZTRI  AEROSPHERE) 160-9-4.8 MCG/ACT AERO Inhale 2 puffs into the lungs in the morning and at bedtime.  carbamide peroxide (DEBROX) 6.5 % OTIC solution Place 5 drops into the left ear 2 (two) times daily. 15 mL 0   Cyanocobalamin  (VITAMIN B-12) 2500 MCG SUBL Place 1 tablet (2,500 mcg total) under the tongue daily. DISSOLVE 1 TABLET UNDER THE TONGUE DAILY 90 tablet 3   dicyclomine  (BENTYL ) 20 MG tablet Take 1 tablet (20 mg total) by mouth 4 (four) times daily as needed (intestinal spasms). 120 tablet 1   ezetimibe  (ZETIA ) 10 MG tablet Take 1 tablet (10 mg total) by mouth daily. 90 tablet 0   fluticasone  (FLONASE ) 50 MCG/ACT nasal spray SPRAY 2 SPRAYS INTO EACH NOSTRIL EVERY DAY (Patient taking differently: Place 2 sprays into both nostrils daily as needed for allergies or rhinitis.) 48 g 2   glucose blood (ONETOUCH VERIO) test strip E11.42 Use new test strip each time when checking FBS 100 each 2   hydrOXYzine  (VISTARIL ) 25 MG capsule TAKE 1 CAPSULE (25 MG TOTAL) BY MOUTH AT BEDTIME AS NEEDED. 90 capsule 0   icosapent  Ethyl (VASCEPA ) 1 g capsule TAKE 2 CAPSULES BY MOUTH TWICE  DAILY 120 capsule 11   ipratropium-albuterol  (DUONEB) 0.5-2.5 (3) MG/3ML SOLN Take 3 mLs by nebulization every 6 (six) hours as needed (SOB. WHEEZING). 360 mL 1   Lancets (ONETOUCH DELICA PLUS LANCET33G) MISC E 11.42 Use new lancet each time when checking FBS 100 each 2   loratadine  (CLARITIN ) 10 MG tablet Take 1 tablet (10 mg total) by mouth daily. (Patient taking differently: Take 10 mg by mouth daily as needed for allergies.) 90 tablet 3   Multiple Vitamin (MULTIVITAMIN WITH MINERALS) TABS tablet Take 1 tablet by mouth  daily.     omeprazole  (PRILOSEC) 20 MG capsule Take 20 mg by mouth daily as needed (Indigestion).     ondansetron  (ZOFRAN -ODT) 4 MG disintegrating tablet Take 4 mg by mouth every 6 (six) hours as needed for nausea or vomiting.     pantoprazole  (PROTONIX ) 40 MG tablet Take 40 mg by mouth daily as needed (Indigestion).     phentermine  (ADIPEX-P ) 37.5 MG tablet Take 1 tablet (37.5 mg total) by mouth every morning. 30 tablet 0   pregabalin  (LYRICA ) 300 MG capsule Take 1 capsule (300 mg total) by mouth 2 (two) times daily. 60 capsule 2   ramipril  (ALTACE ) 2.5 MG capsule Take 1 capsule (2.5 mg total) by mouth daily. 90 capsule 0   rOPINIRole  (REQUIP ) 3 MG tablet Take 1 tablet (3 mg total) by mouth daily 1-3 hours before bedtime. 90 tablet 0   rosuvastatin  (CRESTOR ) 40 MG tablet TAKE 1 TABLET BY MOUTH EVERY DAY 90 tablet 0   Semaglutide , 2 MG/DOSE, 8 MG/3ML SOPN Inject 2 mg as directed once a week. 3 mL 2   solifenacin  (VESICARE ) 5 MG tablet TAKE 1 TABLET (5 MG TOTAL) BY MOUTH DAILY. 90 tablet 0   SYMBICORT  160-4.5 MCG/ACT inhaler INHALE 2 PUFFS INTO THE LUNGS TWICE A DAY 10.2 each 3   topiramate  (TOPAMAX ) 50 MG tablet Take 1 tablet (50 mg total) by mouth daily. 90 tablet 0   vortioxetine  HBr (TRINTELLIX ) 20 MG TABS tablet Take 1 tablet (20 mg total) by mouth daily. 90 tablet 0   Albuterol -Budesonide  (AIRSUPRA ) 90-80 MCG/ACT AERO Inhale 2 puffs into the lungs 4 (four) times daily as needed. 10.7 g 3   Eszopiclone  3 MG TABS TAKE 1 TABLET BY MOUTH AT BEDTIME. 30 tablet 2   No facility-administered medications prior to visit.    No Known Allergies  Review of Systems  Constitutional:  Positive for chills and fatigue. Negative for fever.  HENT:  Positive for sore throat. Negative for congestion, ear pain, postnasal drip, rhinorrhea, sinus pressure and sinus pain.   Respiratory:  Positive for Greene. Negative for shortness of breath.   Cardiovascular:  Negative for chest pain.  Gastrointestinal:   Negative for diarrhea and nausea.  Neurological:  Negative for dizziness and headaches.       Objective:        10/09/2023   10:55 AM 09/28/2023    3:55 PM 09/13/2023   11:45 AM  Vitals with BMI  Height 5' 3 5' 3   Weight 172 lbs 174 lbs 5 oz   BMI 30.48 30.88   Systolic 122 127 884  Diastolic 78 60 79  Pulse 78 76 69    No data found.   Physical Exam Vitals reviewed.  Constitutional:      General: She is in acute distress (dyspneic with speaking.).  HENT:     Right Ear: Tympanic membrane, ear canal and external ear normal.     Left Ear: Tympanic membrane, ear canal and external ear normal.     Nose: Nose normal.     Mouth/Throat:     Pharynx: Oropharynx is clear.  Cardiovascular:     Rate and Rhythm: Regular rhythm. Tachycardia present.     Heart sounds: Normal heart sounds. No murmur heard. Pulmonary:     Effort: Tachypnea present. No respiratory distress.     Breath sounds: Normal breath sounds.     Comments: Pulse oximetry 99%.  Put her on 2 Liters of oxygen  to see if this would ease her tachypnea which it did improve some.   Lymphadenopathy:     Cervical: No cervical adenopathy.  Neurological:     Mental Status: She is alert and oriented to person, place, and time.  Psychiatric:        Mood and Affect: Mood normal.        Behavior: Behavior normal.     Health Maintenance Due  Topic Date Due   OPHTHALMOLOGY EXAM  08/06/2022   COVID-19 Vaccine (7 - Pfizer risk 2024-25 season) 07/03/2023    There are no preventive care reminders to display for this patient.   Lab Results  Component Value Date   TSH 0.966 08/23/2023   Lab Results  Component Value Date   WBC 8.4 09/28/2023   HGB 13.4 09/28/2023   HCT 41.3 09/28/2023   MCV 91.4 09/28/2023   PLT 226 09/28/2023   Lab Results  Component Value Date   NA 137 09/28/2023   K 3.9 09/28/2023   CO2 22 09/28/2023   GLUCOSE 109 (H) 09/28/2023   BUN 23 09/28/2023   CREATININE 1.03 (H) 09/28/2023    BILITOT 0.3 09/28/2023   ALKPHOS 92 09/28/2023   AST 14 (L) 09/28/2023   ALT 8 09/28/2023   PROT 6.5 09/28/2023   ALBUMIN 3.8 09/28/2023   CALCIUM  9.1 09/28/2023   ANIONGAP 12 09/28/2023   EGFR 78.0 10/09/2023   Lab Results  Component Value Date   CHOL 241 (H) 08/23/2023   Lab Results  Component Value Date   HDL 61 08/23/2023   Lab Results  Component Value Date   LDLCALC 158 (H) 08/23/2023   Lab Results  Component Value Date   TRIG 122 08/23/2023   Lab Results  Component Value Date   CHOLHDL 4.0 08/23/2023   Lab Results  Component Value Date   HGBA1C 5.6 08/23/2023  Assessment & Plan:  Positive D dimer Assessment & Plan: CALLED PATIENT AND HER HUSBAND UNSUCCESSFULLY. LEFT MESSAGES THAT SHE SHOULD GO DIRECTLY TO THE ED TO RULE OUT A BLOOD CLOT IN HER LUNGS WITH A CTA CHEST.    Acute Greene Assessment & Plan: Check xray.  Neg covid, rsv.   Orders: -     POCT respiratory syncytial virus -     POCT rapid strep A -     POC COVID-19 BinaxNow -     POC Influenza A&B(BINAX/QUICKVUE)  Shortness of breath Assessment & Plan: Improved with oxygen .  Lung sounds normal, but clearly in distress.  Ordered blood count, chemistry panel (liver and kidney function), d dimer.     Respiratory distress Assessment & Plan: Patient is maintaining oxygenation through increased RR and Tachcyardia.    Other orders -     Eszopiclone ; Take 1 tablet (3 mg total) by mouth at bedtime.  Dispense: 30 tablet; Refill: 2 -     Airsupra ; Inhale 2 puffs into the lungs 4 (four) times daily as needed.  Dispense: 10.7 g; Refill: 3     Meds ordered this encounter  Medications   Eszopiclone  3 MG TABS    Sig: Take 1 tablet (3 mg total) by mouth at bedtime.    Dispense:  30 tablet    Refill:  2    Not to exceed 3 additional fills before 12/16/2023   Albuterol -Budesonide  (AIRSUPRA ) 90-80 MCG/ACT AERO    Sig: Inhale 2 puffs into the lungs 4 (four) times daily as needed.     Dispense:  10.7 g    Refill:  3    Orders Placed This Encounter  Procedures   POCT respiratory syncytial virus   Rapid Strep A   POC COVID-19 BinaxNow   POC Influenza A&B (Binax test)     Follow-up: No follow-ups on file.  An After Visit Summary was printed and given to the patient.  Abigail Free, MD [facility] [phone]

## 2023-10-09 NOTE — Assessment & Plan Note (Signed)
 CALLED PATIENT AND HER HUSBAND UNSUCCESSFULLY. LEFT MESSAGES THAT SHE SHOULD GO DIRECTLY TO THE ED TO RULE OUT A BLOOD CLOT IN HER LUNGS WITH A CTA CHEST.

## 2023-10-12 ENCOUNTER — Telehealth: Payer: Self-pay

## 2023-10-15 ENCOUNTER — Other Ambulatory Visit: Payer: Self-pay | Admitting: Family Medicine

## 2023-10-15 DIAGNOSIS — E782 Mixed hyperlipidemia: Secondary | ICD-10-CM

## 2023-10-15 DIAGNOSIS — I10 Essential (primary) hypertension: Secondary | ICD-10-CM

## 2023-10-15 DIAGNOSIS — K219 Gastro-esophageal reflux disease without esophagitis: Secondary | ICD-10-CM

## 2023-10-17 ENCOUNTER — Other Ambulatory Visit: Payer: Self-pay | Admitting: Family Medicine

## 2023-10-23 ENCOUNTER — Other Ambulatory Visit: Payer: Self-pay | Admitting: *Deleted

## 2023-10-23 ENCOUNTER — Other Ambulatory Visit: Payer: Self-pay | Admitting: Licensed Clinical Social Worker

## 2023-10-23 ENCOUNTER — Telehealth: Payer: Self-pay

## 2023-10-23 ENCOUNTER — Encounter: Payer: Self-pay | Admitting: *Deleted

## 2023-10-23 ENCOUNTER — Encounter: Payer: Self-pay | Admitting: Licensed Clinical Social Worker

## 2023-10-23 NOTE — Patient Instructions (Signed)
 Rollo DELENA Public - I am sorry I was unable to reach you today for our scheduled appointment. I work with Cox, Abigail, MD and am calling to support your healthcare needs. Please contact me at [phone] at your earliest convenience. I look forward to speaking with you soon.   Thank you,  Suzen L. Ramonita, RN, BSN, CCM [facility]  [facility], [facility] RN Care Manager Direct Dial: [phone]  Fax: [phone]

## 2023-10-23 NOTE — Transitions of Care (Post Inpatient/ED Visit) (Signed)
 10/23/2023 Spoke with husband who states patient discharged 10/20/23 but states he had to take her back to the hospital and she is still admitted - [facility] outreach is not indicated   Patient ID: Hailey Greene, female   DOB: 29-Dec-1950, 73 y.o.   MRN: [id number]  Shona Prow RN, CCM [place]  [facility] RN Care Manager [phone]

## 2023-10-23 NOTE — Patient Outreach (Signed)
 Error with opening duplicate note  Janeane Cozart L. Ramonita, RN, BSN, CCM [place]  [facility], [facility] RN Care Manager Direct Dial: [phone]  Fax: [phone]

## 2023-10-24 ENCOUNTER — Telehealth: Payer: Self-pay

## 2023-10-24 NOTE — Transitions of Care (Post Inpatient/ED Visit) (Unsigned)
   10/24/2023  Name: Hailey Greene MRN: [id number] DOB: 03-26-1951  Today's TOC FU Call Status: Today's TOC FU Call Status:: Unsuccessful Call (1st Attempt) Unsuccessful Call (1st Attempt) Date: 10/24/23  Attempted to reach the patient regarding the most recent Inpatient/ED visit.  Follow Up Plan: Additional outreach attempts will be made to reach the patient to complete the Transitions of Care (Post Inpatient/ED visit) call.   Signature Julian Lemmings, LPN [facility] Nurse Health Advisor Direct Dial [phone]

## 2023-10-24 NOTE — Telephone Encounter (Signed)
 Copied from CRM [id number]. Topic: General - Call Back - No Documentation >> Oct 24, 2023 10:54 AM Cleave MATSU wrote: Reason for CRM: pt returned lynette call

## 2023-10-25 NOTE — Transitions of Care (Post Inpatient/ED Visit) (Unsigned)
   10/25/2023  Name: Hailey Greene MRN: [id number] DOB: 09-24-1950  Today's TOC FU Call Status: Today's TOC FU Call Status:: Unsuccessful Call (2nd Attempt) Unsuccessful Call (1st Attempt) Date: 10/24/23 Unsuccessful Call (2nd Attempt) Date: 10/25/23  Attempted to reach the patient regarding the most recent Inpatient/ED visit.  Follow Up Plan: Additional outreach attempts will be made to reach the patient to complete the Transitions of Care (Post Inpatient/ED visit) call.   Signature Julian Lemmings, LPN [facility] Nurse Health Advisor Direct Dial [phone]

## 2023-10-26 NOTE — Transitions of Care (Post Inpatient/ED Visit) (Signed)
 10/26/2023  Name: Hailey Greene MRN: [id number] DOB: 08-Aug-1950  Today's TOC FU Call Status: Today's TOC FU Call Status:: Successful TOC FU Call Completed Unsuccessful Call (1st Attempt) Date: 10/24/23 Unsuccessful Call (2nd Attempt) Date: 10/25/23 [facility] FU Call Complete Date: 10/26/23 Patient's Name and Date of Birth confirmed.  Transition Care Management Follow-up Telephone Call Date of Discharge: 10/23/23 Discharge Facility: Other [facility]) Name of Other ([facility]) Discharge Facility: [facility] Type of Discharge: Inpatient Admission Primary Inpatient Discharge Diagnosis:: COPD How have you been since you were released from the hospital?: Better Any questions or concerns?: No  Items Reviewed: Did you receive and understand the discharge instructions provided?: Yes Medications obtained,verified, and reconciled?: Yes (Medications Reviewed) Any new allergies since your discharge?: No Dietary orders reviewed?: Yes Do you have support at home?: Yes People in Home [RPT]: spouse  Medications Reviewed Today: Medications Reviewed Today     Reviewed by Emmitt Pan, LPN (Licensed Practical Nurse) on 10/26/23 at 1220  Med List Status: <None>   Medication Order Taking? Sig Documenting Provider Last Dose Status Informant  Albuterol -Budesonide  (AIRSUPRA ) 90-80 MCG/ACT AERO [id number] Yes Inhale 2 puffs into the lungs 4 (four) times daily as needed. Cox, Kirsten, MD  Active   amitriptyline  (ELAVIL ) 25 MG tablet [id number] Yes Take 1 tablet (25 mg total) by mouth every evening. Cox, Kirsten, MD  Active Self, Pharmacy Records, Spouse/Significant Other  anastrozole  (ARIMIDEX ) 1 MG tablet [id number] Yes Take 1 tablet (1 mg total) by mouth daily. Cornelius Wanda DEL, MD  Active   ASPIRIN  81 PO [id number] Yes Take 81 mg by mouth daily. [provider]  Active Self, Pharmacy Records, Spouse/Significant Other  Blood Glucose Monitoring Suppl DEVI [id number] Yes 1 each by Does  not apply route daily. E11.9 May substitute to any manufacturer covered by patient's insurance. Sherre Clapper, MD  Active Self, Pharmacy Records, Spouse/Significant Other  Blood Glucose Monitoring Suppl DEVI [id number] Yes 1 each by Does not apply route in the morning, at noon, and at bedtime. May substitute to any manufacturer covered by patient's insurance. CoxClapper, MD  Active Self, Pharmacy Records, Spouse/Significant Other  budeson-glycopyrrolate-formoterol  (BREZTRI  AEROSPHERE) 160-9-4.8 MCG/ACT TERESE [id number] Yes Inhale 2 puffs into the lungs in the morning and at bedtime. Cox, Kirsten, MD  Active Self, Pharmacy Records, Spouse/Significant Other  carbamide peroxide (DEBROX) 6.5 % OTIC solution [id number] Yes Place 5 drops into the left ear 2 (two) times daily. Teressa Harrie HERO, FNP  Active Self, Pharmacy Records, Spouse/Significant Other  Cyanocobalamin  (VITAMIN B-12) 2500 MCG SUBL [id number] Yes Place 1 tablet (2,500 mcg total) under the tongue daily. DISSOLVE 1 TABLET UNDER THE TONGUE DAILY Cox, Kirsten, MD  Active Self, Pharmacy Records, Spouse/Significant Other  dicyclomine  (BENTYL ) 20 MG tablet [id number] Yes TAKE 1 TABLET (20 MG TOTAL) BY MOUTH 4 (FOUR) TIMES DAILY AS NEEDED (INTESTINAL SPASMS). Sherre Clapper, MD  Active   Eszopiclone  3 MG TABS [id number] Yes Take 1 tablet (3 mg total) by mouth at bedtime. Cox, Kirsten, MD  Active   ezetimibe  (ZETIA ) 10 MG tablet [id number] Yes TAKE 1 TABLET BY MOUTH EVERY DAY Cox, Kirsten, MD  Active   fluticasone  (FLONASE ) 50 MCG/ACT nasal spray [id number] Yes SPRAY 2 SPRAYS INTO EACH NOSTRIL EVERY DAY  Patient taking differently: Place 2 sprays into both nostrils daily as needed for allergies or rhinitis.   Sherre Clapper, MD  Active Self, Pharmacy Records, Spouse/Significant Other  glucose blood [facility] VERIO) test strip [id number] Yes E11.42 Use new test strip each time when  checking FBS Cox, Kirsten, MD  Active Self, Pharmacy Records, Spouse/Significant Other   hydrOXYzine  (VISTARIL ) 25 MG capsule [id number] Yes TAKE 1 CAPSULE (25 MG TOTAL) BY MOUTH AT BEDTIME AS NEEDED. Cox, Kirsten, MD  Active   icosapent  Ethyl (VASCEPA ) 1 g capsule [id number] Yes TAKE 2 CAPSULES BY MOUTH TWICE  DAILY Cox, Kirsten, MD  Active Self, Pharmacy Records, Spouse/Significant Other  ipratropium-albuterol  (DUONEB) 0.5-2.5 (3) MG/3ML SOLN [id number] Yes Take 3 mLs by nebulization every 6 (six) hours as needed (SOB. WHEEZING). CoxAbigail, MD  Active Self, Pharmacy Records, Spouse/Significant Other  Lancets [facility] CATHRY[place] PLUS [place]) [place] [id number] Yes E 11.42 Use new lancet each time when checking FBS Cox, Kirsten, MD  Active Self, Pharmacy Records, Spouse/Significant Other  loratadine  (CLARITIN ) 10 MG tablet [id number] Yes Take 1 tablet (10 mg total) by mouth daily.  Patient taking differently: Take 10 mg by mouth daily as needed for allergies.   CoxAbigail, MD  Active Self, Pharmacy Records, Spouse/Significant Other  mirtazapine  (REMERON ) 15 MG tablet [id number] Yes Take 15 mg by mouth at bedtime. [provider]  Active   Multiple Vitamin (MULTIVITAMIN WITH MINERALS) TABS tablet [id number] Yes Take 1 tablet by mouth daily. [provider]  Active Spouse/Significant Other, Self, Pharmacy Records  omeprazole  (PRILOSEC) 20 MG capsule [id number] Yes TAKE 1 CAPSULE BY MOUTH EVERY DAY Cox, Kirsten, MD  Active   ondansetron  (ZOFRAN -ODT) 4 MG disintegrating tablet [id number] Yes Take 4 mg by mouth every 6 (six) hours as needed for nausea or vomiting. [provider]  Active Self, Pharmacy Records, Spouse/Significant Other  pantoprazole  (PROTONIX ) 40 MG tablet [id number] Yes Take 40 mg by mouth daily as needed (Indigestion). [provider]  Active Self, Pharmacy Records, Spouse/Significant Other  phentermine  (ADIPEX-P ) 37.5 MG tablet [id number] Yes Take 1 tablet (37.5 mg total) by mouth every morning. Cox, Kirsten, MD  Active Self, Pharmacy Records,  Spouse/Significant Other  pregabalin  (LYRICA ) 300 MG capsule [id number] Yes Take 1 capsule (300 mg total) by mouth 2 (two) times daily. CoxAbigail, MD  Active Self, Pharmacy Records, Spouse/Significant Other  ramipril  (ALTACE ) 2.5 MG capsule [id number] Yes TAKE 1 CAPSULE BY MOUTH EVERY DAY Cox, Kirsten, MD  Active   rOPINIRole  (REQUIP ) 3 MG tablet [id number] Yes TAKE 1 TABLET (3 MG TOTAL) BY MOUTH DAILY 1-3 HOURS BEFORE BEDTIME. Cox, Kirsten, MD  Active   rosuvastatin  (CRESTOR ) 40 MG tablet [id number] Yes TAKE 1 TABLET BY MOUTH EVERY DAY Cox, Kirsten, MD  Active Self, Pharmacy Records, Spouse/Significant Other  Semaglutide , 2 MG/DOSE, 8 MG/3ML SOPN [id number] Yes Inject 2 mg as directed once a week. Cox, Kirsten, MD  Active Self, Pharmacy Records, Spouse/Significant Other           Med Note JACKOLYN WADDELL DEL   Tue Sep 12, 2023  8:13 AM) Tuesday's  solifenacin  (VESICARE ) 5 MG tablet [id number] Yes TAKE 1 TABLET (5 MG TOTAL) BY MOUTH DAILY. Sherre Abigail, MD  Active   SYMBICORT  160-4.5 MCG/ACT inhaler [id number] Yes INHALE 2 PUFFS INTO THE LUNGS TWICE A DAY Cox, Kirsten, MD  Active Self, Pharmacy Records, Spouse/Significant Other  topiramate  (TOPAMAX ) 50 MG tablet [id number] Yes Take 1 tablet (50 mg total) by mouth daily. Cox, Kirsten, MD  Active Self, Pharmacy Records, Spouse/Significant Other  vortioxetine  HBr (TRINTELLIX ) 20 MG TABS tablet [id number] Yes Take 1 tablet (20 mg total) by mouth daily. Cox, Kirsten, MD  Active Self, Pharmacy Records, Spouse/Significant Other            Home  Care and Equipment/Supplies: Were Home Health Services Ordered?: NA Any new equipment or medical supplies ordered?: Yes Name of Medical supply agency?: unknown Were you able to get the equipment/medical supplies?: Yes Do you have any questions related to the use of the equipment/supplies?: No  Functional Questionnaire: Do you need assistance with bathing/showering or dressing?: No Do you need assistance with  meal preparation?: No Do you need assistance with eating?: No Do you have difficulty maintaining continence: No Do you need assistance with getting out of bed/getting out of a chair/moving?: No Do you have difficulty managing or taking your medications?: No  Follow up appointments reviewed: PCP Follow-up appointment confirmed?: Yes Date of PCP follow-up appointment?: 11/01/23 Follow-up Provider: [facility] Follow-up appointment confirmed?: NA Do you need transportation to your follow-up appointment?: No Do you understand care options if your condition(s) worsen?: Yes-patient verbalized understanding    SIGNATURE Julian Lemmings, LPN [facility] Nurse Health Advisor Direct Dial [phone]

## 2023-11-01 ENCOUNTER — Encounter: Payer: Self-pay | Admitting: Family Medicine

## 2023-11-01 ENCOUNTER — Ambulatory Visit (INDEPENDENT_AMBULATORY_CARE_PROVIDER_SITE_OTHER): Admitting: Family Medicine

## 2023-11-01 VITALS — BP 110/60 | HR 96 | Temp 97.5°F | Resp 16 | Ht 63.0 in | Wt 165.0 lb

## 2023-11-01 DIAGNOSIS — J410 Simple chronic bronchitis: Secondary | ICD-10-CM

## 2023-11-01 DIAGNOSIS — J9611 Chronic respiratory failure with hypoxia: Secondary | ICD-10-CM

## 2023-11-01 DIAGNOSIS — J441 Chronic obstructive pulmonary disease with (acute) exacerbation: Secondary | ICD-10-CM

## 2023-11-01 DIAGNOSIS — J479 Bronchiectasis, uncomplicated: Secondary | ICD-10-CM

## 2023-11-01 NOTE — Progress Notes (Unsigned)
 Subjective:  Patient ID: Hailey Greene, female    DOB: [birth year]  Age: 73 y.o. MRN: [id number]  Chief Complaint  Patient presents with   Shortness of Breath    HPI:  Discussed the use of AI scribe software for clinical note transcription with the patient, who gave verbal consent to proceed.  History of Present Illness   Hailey Greene is a 73 year old female with COPD exacerbation and bronchiectasis who presents with persistent shortness of breath.  Dyspnea and respiratory symptoms - Persistent shortness of breath ongoing for the past eight months - Dyspnea worsens with exertion, such as walking to the bathroom - Nocturnal oxygen  not routinely used, but oxygen  is kept by the bed - No full recovery following recent hospitalizations and treatments  Recent hospitalizations and exacerbations - Admitted to [facility] from July 21st to July 25th for COPD exacerbation and bronchiectasis - Treated with prednisone  and doxycycline during initial admission - Readmitted from July 27th to July 28th for persistent symptoms - Visited the hospital again yesterday and diagnosed with acute COPD exacerbation - Echocardiogram performed during recent hospitalization - Frequent hospital visits over the past eight months for respiratory issues  Home oxygen  and inhaled therapies - Uses home oxygen  as needed, especially with exertion - Portable oxygen  tank used when necessary - Does not use oxygen  while sleeping, but keeps it by the bed - Current inhaler regimen includes Breztri , two puffs twice daily, and Airsupra , two puffs four times daily - Finds nebulizer treatments more effective and uses them three times daily  - She has been using the nebulizer treatments and the airsupra  several times a day.         10/09/2023   10:54 AM 09/28/2023    4:07 PM 09/28/2023    4:01 PM 08/23/2023   10:25 AM 07/11/2023    1:25 PM  Depression screen PHQ 2/9  Decreased Interest 0 0 0 0 0  Down,  Depressed, Hopeless 0 0 0 0 0  PHQ - 2 Score 0 0 0 0 0  Altered sleeping 3      Tired, decreased energy 0      Change in appetite 0      Feeling bad or failure about yourself  0      Trouble concentrating 0      Moving slowly or fidgety/restless 0      Suicidal thoughts 0      PHQ-9 Score 3      Difficult doing work/chores Not difficult at all            08/23/2023   10:25 AM  Fall Risk   Falls in the past year? 1  Number falls in past yr: 1  Injury with Fall? 0  Risk for fall due to : History of fall(s)  Follow up Falls evaluation completed    Patient Care Team: Sherre Clapper, MD as PCP - General (Family Medicine) Rubie Kemps, MD as Consulting Physician (Orthopedic Surgery) Corlis Joen BIRCH, RN as Oncology Nurse Navigator [name] Lister D as Social Worker   Review of Systems  Constitutional:  Negative for chills, fatigue and fever.  HENT:  Negative for congestion, ear pain and sore throat.   Respiratory:  Positive for shortness of breath. Negative for cough.   Cardiovascular:  Negative for chest pain and palpitations.  Gastrointestinal:  Negative for abdominal pain, constipation, diarrhea, nausea and vomiting.  Endocrine: Negative for polydipsia, polyphagia and polyuria.  Genitourinary:  Negative for difficulty  urinating and dysuria.  Musculoskeletal:  Negative for arthralgias, back pain and myalgias.  Skin:  Negative for rash.  Neurological:  Negative for headaches.  Psychiatric/Behavioral:  Negative for dysphoric mood. The patient is not nervous/anxious.     Current Outpatient Medications on File Prior to Visit  Medication Sig Dispense Refill   Albuterol -Budesonide  (AIRSUPRA ) 90-80 MCG/ACT AERO Inhale 2 puffs into the lungs 4 (four) times daily as needed. 10.7 g 3   amitriptyline  (ELAVIL ) 25 MG tablet Take 1 tablet (25 mg total) by mouth every evening. 90 tablet 1   anastrozole  (ARIMIDEX ) 1 MG tablet Take 1 tablet (1 mg total) by mouth daily. 30 tablet 5   ASPIRIN  81 PO  Take 81 mg by mouth daily.     Blood Glucose Monitoring Suppl DEVI 1 each by Does not apply route daily. E11.9 May substitute to any manufacturer covered by patient's insurance. 1 each 0   Blood Glucose Monitoring Suppl DEVI 1 each by Does not apply route in the morning, at noon, and at bedtime. May substitute to any manufacturer covered by patient's insurance. 1 each 0   budeson-glycopyrrolate-formoterol  (BREZTRI  AEROSPHERE) 160-9-4.8 MCG/ACT AERO Inhale 2 puffs into the lungs in the morning and at bedtime.     carbamide peroxide (DEBROX) 6.5 % OTIC solution Place 5 drops into the left ear 2 (two) times daily. 15 mL 0   Cyanocobalamin  (VITAMIN B-12) 2500 MCG SUBL Place 1 tablet (2,500 mcg total) under the tongue daily. DISSOLVE 1 TABLET UNDER THE TONGUE DAILY 90 tablet 3   dicyclomine  (BENTYL ) 20 MG tablet TAKE 1 TABLET (20 MG TOTAL) BY MOUTH 4 (FOUR) TIMES DAILY AS NEEDED (INTESTINAL SPASMS). 360 tablet 1   Eszopiclone  3 MG TABS Take 1 tablet (3 mg total) by mouth at bedtime. 30 tablet 2   ezetimibe  (ZETIA ) 10 MG tablet TAKE 1 TABLET BY MOUTH EVERY DAY 90 tablet 1   fluticasone  (FLONASE ) 50 MCG/ACT nasal spray SPRAY 2 SPRAYS INTO EACH NOSTRIL EVERY DAY 48 g 2   glucose blood (ONETOUCH VERIO) test strip E11.42 Use new test strip each time when checking FBS 100 each 2   hydrOXYzine  (VISTARIL ) 25 MG capsule TAKE 1 CAPSULE (25 MG TOTAL) BY MOUTH AT BEDTIME AS NEEDED. 90 capsule 0   icosapent  Ethyl (VASCEPA ) 1 g capsule TAKE 2 CAPSULES BY MOUTH TWICE  DAILY 120 capsule 11   ipratropium-albuterol  (DUONEB) 0.5-2.5 (3) MG/3ML SOLN Take 3 mLs by nebulization every 6 (six) hours as needed (SOB. WHEEZING). 360 mL 1   Lancets (ONETOUCH DELICA PLUS LANCET33G) MISC E 11.42 Use new lancet each time when checking FBS 100 each 2   loratadine  (CLARITIN ) 10 MG tablet Take 1 tablet (10 mg total) by mouth daily. 90 tablet 3   mirtazapine  (REMERON ) 15 MG tablet Take 15 mg by mouth at bedtime.     Multiple Vitamin  (MULTIVITAMIN WITH MINERALS) TABS tablet Take 1 tablet by mouth daily.     omeprazole  (PRILOSEC) 20 MG capsule TAKE 1 CAPSULE BY MOUTH EVERY DAY 90 capsule 1   ondansetron  (ZOFRAN -ODT) 4 MG disintegrating tablet Take 4 mg by mouth every 6 (six) hours as needed for nausea or vomiting.     phentermine  (ADIPEX-P ) 37.5 MG tablet Take 1 tablet (37.5 mg total) by mouth every morning. 30 tablet 0   pregabalin  (LYRICA ) 300 MG capsule Take 1 capsule (300 mg total) by mouth 2 (two) times daily. 60 capsule 2   ramipril  (ALTACE ) 2.5 MG capsule TAKE 1 CAPSULE BY  MOUTH EVERY DAY 90 capsule 1   rOPINIRole  (REQUIP ) 3 MG tablet TAKE 1 TABLET (3 MG TOTAL) BY MOUTH DAILY 1-3 HOURS BEFORE BEDTIME. 90 tablet 0   rosuvastatin  (CRESTOR ) 40 MG tablet TAKE 1 TABLET BY MOUTH EVERY DAY 90 tablet 0   Semaglutide , 2 MG/DOSE, 8 MG/3ML SOPN Inject 2 mg as directed once a week. 3 mL 2   solifenacin  (VESICARE ) 5 MG tablet TAKE 1 TABLET (5 MG TOTAL) BY MOUTH DAILY. 90 tablet 0   SYMBICORT  160-4.5 MCG/ACT inhaler INHALE 2 PUFFS INTO THE LUNGS TWICE A DAY 10.2 each 3   topiramate  (TOPAMAX ) 50 MG tablet Take 1 tablet (50 mg total) by mouth daily. 90 tablet 0   vortioxetine  HBr (TRINTELLIX ) 20 MG TABS tablet Take 1 tablet (20 mg total) by mouth daily. 90 tablet 0   No current facility-administered medications on file prior to visit.   Past Medical History:  Diagnosis Date   2019 novel coronavirus disease (COVID-19) 12/31/2018   Acute hypoxemic respiratory failure due to COVID-19 (HCC)    Asthma    Bronchiectasis (HCC)    Chicken pox    Diabetes (HCC)    GERD (gastroesophageal reflux disease)    Hypertension    IBS (irritable bowel syndrome)    Major depressive disorder    Mixed hyperlipidemia    Neuromuscular disorder (HCC)    peripheral neuropathy   Neuropathy    Osteoarthritis    Pneumonia    x2   Primary insomnia    Primary osteoarthritis of left knee 05/31/2019   RLS (restless legs syndrome)    S/P total knee  replacement 07/15/2019   Sleep apnea    does not use CPAP   Status post total knee replacement 07/25/2019   Tachycardia    Urge incontinence    UTI (lower urinary tract infection)    Weakness    Past Surgical History:  Procedure Laterality Date   ABDOMINAL HYSTERECTOMY     APPENDECTOMY     BREAST BIOPSY Right 04/26/2023   US  RT BREAST BX W LOC DEV 1ST LESION IMG BX SPEC US  GUIDE 04/26/2023 [facility]   BREAST BIOPSY  09/12/2023   US  RT RADIOACTIVE SEED LOC 09/12/2023 [facility]   BREAST LUMPECTOMY     CHOLECYSTECTOMY     EXCISION OF BREAST BIOPSY Right 09/13/2023   Procedure: EXCISION OF BREAST BIOPSY WITH RADIOACTIVE SEED GUIDED LOCALIZATION;  Surgeon: Belinda Cough, MD;  Location: [facility] OR;  Service: General;  Laterality: Right;  Right breast rsl excisional biopsy   HERNIA REPAIR     REPLACEMENT TOTAL KNEE Right    TONSILLECTOMY     TOTAL KNEE ARTHROPLASTY Left 07/15/2019   Procedure: TOTAL KNEE ARTHROPLASTY;  Surgeon: Rubie Kemps, MD;  Location: [facility];  Service: Orthopedics;  Laterality: Left;   TUBAL LIGATION      Family History  Problem Relation Age of Onset   Thyroid  disease Mother    Cancer Mother    Migraines Mother    Brain cancer Father    Throat cancer Father    Cancer - Other Father    Heart disease Maternal Grandmother    Diabetes Daughter    Social History   Socioeconomic History   Marital status: Married    Spouse name: Alm   Number of children: 3   Years of education: 12   Highest education level: Not on file  Occupational History   Occupation: Housewife  Tobacco Use   Smoking status: Former  Current packs/day: 0.00    Average packs/day: 2.0 packs/day for 12.0 years (24.0 ttl pk-yrs)    Types: Cigarettes    Start date: 05/25/1998    Quit date: 05/25/2010    Years since quitting: 13.4   Smokeless tobacco: Never   Tobacco comments:    Used to smoke 1-2 packs a days  Vaping Use   Vaping status: Never Used  Substance and Sexual  Activity   Alcohol use: Not Currently    Comment: past occasionally   Drug use: Never   Sexual activity: Not Currently    Birth control/protection: Surgical    Comment: hyst  Other Topics Concern   Not on file  Social History Narrative   Born and raised in [place], [place]. Currently lives in a house with her husband. 1 dog. Fun: Garden, feed birds, swimming   Denies any religious beliefs effecting health care.    Social Drivers of Health   Financial Resource Strain: Medium Risk (10/02/2023)   Overall Financial Resource Strain (CARDIA)    Difficulty of Paying Living Expenses: Somewhat hard  Food Insecurity: No Food Insecurity (10/02/2023)   Hunger Vital Sign    Worried About Running Out of Food in the Last Year: Never true    Ran Out of Food in the Last Year: Never true  Recent Concern: Food Insecurity - Food Insecurity Present (08/19/2023)   Hunger Vital Sign    Worried About Running Out of Food in the Last Year: Sometimes true    Ran Out of Food in the Last Year: Sometimes true  Transportation Needs: No Transportation Needs (10/02/2023)   PRAPARE - Administrator, Civil Service (Medical): No    Lack of Transportation (Non-Medical): No  Physical Activity: Insufficiently Active (08/19/2023)   Exercise Vital Sign    Days of Exercise per Week: 4 days    Minutes of Exercise per Session: 20 min  Stress: No Stress Concern Present (09/28/2023)   [name] of Occupational Health - Occupational Stress Questionnaire    Feeling of Stress: Not at all  Recent Concern: Stress - Stress Concern Present (08/19/2023)   [name] of Occupational Health - Occupational Stress Questionnaire    Feeling of Stress : Rather much  Social Connections: Socially Integrated (08/19/2023)   Social Connection and Isolation Panel    Frequency of Communication with Friends and Family: More than three times a week    Frequency of Social Gatherings with Friends and Family: Three times a week     Attends Religious Services: More than 4 times per year    Active Member of Clubs or Organizations: Yes    Attends Engineer, structural: More than 4 times per year    Marital Status: Married    Objective:  BP 110/60   Pulse 96   Temp (!) 97.5 F (36.4 C)   Resp 16   Ht 5' 3 (1.6 m)   Wt 165 lb (74.8 kg)   SpO2 97%   BMI 29.23 kg/m      11/01/2023   10:48 AM 10/09/2023   10:55 AM 09/28/2023    3:55 PM  BP/Weight  Systolic BP 110 122 127  Diastolic BP 60 78 60  Wt. (Lbs) 165 172 174.3  BMI 29.23 kg/m2 30.47 kg/m2 30.88 kg/m2    Physical Exam Vitals reviewed.  Constitutional:      Appearance: Normal appearance. She is well-developed.  Neck:     Vascular: No carotid bruit.  Cardiovascular:  Rate and Rhythm: Normal rate and regular rhythm.     Heart sounds: Normal heart sounds.  Pulmonary:     Effort: No respiratory distress.     Breath sounds: Normal breath sounds.     Comments: Dyspnea with talking. Neurological:     Mental Status: She is alert and oriented to person, place, and time.  Psychiatric:        Mood and Affect: Mood normal.        Behavior: Behavior normal.         Lab Results  Component Value Date   WBC 8.4 09/28/2023   HGB 13.4 09/28/2023   HCT 41.3 09/28/2023   PLT 226 09/28/2023   GLUCOSE 109 (H) 09/28/2023   CHOL 241 (H) 08/23/2023   TRIG 122 08/23/2023   HDL 61 08/23/2023   LDLCALC 158 (H) 08/23/2023   ALT 8 09/28/2023   AST 14 (L) 09/28/2023   NA 137 09/28/2023   K 3.9 09/28/2023   CL 102 09/28/2023   CREATININE 1.03 (H) 09/28/2023   BUN 23 09/28/2023   CO2 22 09/28/2023   TSH 0.966 08/23/2023   HGBA1C 5.6 08/23/2023   MICROALBUR 30 09/25/2020      Assessment & Plan:  Simple chronic bronchitis (HCC) Assessment & Plan: COPD with recent exacerbations and bronchiectasis. Multiple hospitalizations for acute exacerbations. Persistent dyspnea. Pneumonia and cardiac issues ruled out. Current treatment includes Breztri   and nebulizer treatments. Airsupra  inhaler ineffective. Cardiologist recommends continued breathing treatments. Pulmonology referral pending. - Discontinue Airsupra  inhaler. - Continue Breztri  two puffs twice daily. - Continue nebulizer treatments three times daily. - Refer to pulmonologist urgently. Appointment within ten days recommended. - Instruct to use portable oxygen  tank as needed. - Advise continuous oxygen  use, including during sleep, for a few days to assess symptom improvement.  Orders: -     Pulmonary Visit  Bronchiectasis without complication (HCC) Assessment & Plan: Continue Breztri  two puffs twice daily. - Continue nebulizer treatments three times daily. - Refer to pulmonology   Chronic respiratory failure with hypoxia [facility]) Assessment & Plan: Chronic respiratory failure with intermittent oxygen  use and persistent dyspnea. Oxygen  saturation may be maintained at rest but requires increased effort. Continuous oxygen  use may reduce exertional dyspnea and improve respiratory function. - Advise continuous use of supplemental oxygen , including during sleep, to assess symptom improvement.      No orders of the defined types were placed in this encounter.   Orders Placed This Encounter  Procedures   Ambulatory referral to Pulmonology     Follow-up: Return if symptoms worsen or fail to improve.   An After Visit Summary was printed and given to the patient.  Abigail Free, MD [facility] [phone]

## 2023-11-03 ENCOUNTER — Encounter: Payer: Self-pay | Admitting: Family Medicine

## 2023-11-03 DIAGNOSIS — J9611 Chronic respiratory failure with hypoxia: Secondary | ICD-10-CM | POA: Insufficient documentation

## 2023-11-03 DIAGNOSIS — J449 Chronic obstructive pulmonary disease, unspecified: Secondary | ICD-10-CM | POA: Insufficient documentation

## 2023-11-03 NOTE — Assessment & Plan Note (Signed)
 Chronic respiratory failure with intermittent oxygen  use and persistent dyspnea. Oxygen  saturation may be maintained at rest but requires increased effort. Continuous oxygen  use may reduce exertional dyspnea and improve respiratory function. - Advise continuous use of supplemental oxygen , including during sleep, to assess symptom improvement.

## 2023-11-03 NOTE — Assessment & Plan Note (Signed)
 COPD with recent exacerbations and bronchiectasis. Multiple hospitalizations for acute exacerbations. Persistent dyspnea. Pneumonia and cardiac issues ruled out. Current treatment includes Breztri  and nebulizer treatments. Airsupra  inhaler ineffective. Cardiologist recommends continued breathing treatments. Pulmonology referral pending. - Discontinue Airsupra  inhaler. - Continue Breztri  two puffs twice daily. - Continue nebulizer treatments three times daily. - Refer to pulmonologist urgently. Appointment within ten days recommended. - Instruct to use portable oxygen  tank as needed. - Advise continuous oxygen  use, including during sleep, for a few days to assess symptom improvement.

## 2023-11-03 NOTE — Assessment & Plan Note (Signed)
 Continue Breztri  two puffs twice daily. - Continue nebulizer treatments three times daily. - Refer to pulmonology

## 2023-11-05 ENCOUNTER — Other Ambulatory Visit: Payer: Self-pay | Admitting: Family Medicine

## 2023-11-05 DIAGNOSIS — J4520 Mild intermittent asthma, uncomplicated: Secondary | ICD-10-CM

## 2023-11-06 ENCOUNTER — Other Ambulatory Visit: Payer: Self-pay | Admitting: Licensed Clinical Social Worker

## 2023-11-06 NOTE — Patient Outreach (Signed)
 Complex Care Management   Visit Note  11/06/2023  Name:  Hailey Greene MRN: [id number] DOB: [birth year]  Situation: Referral received for Complex Care Management related to SDOH Barriers:  resources mailed I obtained verbal consent from Patient.  Visit completed with patient  on the phone  Background:   Past Medical History:  Diagnosis Date   2019 novel coronavirus disease (COVID-19) 12/31/2018   Acute hypoxemic respiratory failure due to COVID-19 (HCC)    Asthma    Bronchiectasis (HCC)    Chicken pox    Diabetes (HCC)    GERD (gastroesophageal reflux disease)    Hypertension    IBS (irritable bowel syndrome)    Major depressive disorder    Mixed hyperlipidemia    Neuromuscular disorder (HCC)    peripheral neuropathy   Neuropathy    Osteoarthritis    Pneumonia    x2   Primary insomnia    Primary osteoarthritis of left knee 05/31/2019   RLS (restless legs syndrome)    S/P total knee replacement 07/15/2019   Sleep apnea    does not use CPAP   Status post total knee replacement 07/25/2019   Tachycardia    Urge incontinence    UTI (lower urinary tract infection)    Weakness     Assessment:Patient is still awaiting recourses for food pantry that have been mailed     Recommendation:   none  Follow Up Plan:   Telephone follow up appointment date/time:  11/20/2023 at 930 am  Hailey Greene Hailey [name] [name], PhD [facility], [facility] Social Worker Direct Dial: [phone]  Fax: [phone]

## 2023-11-06 NOTE — Patient Instructions (Signed)
 Visit Information  Thank you for taking time to visit with me today. Please don't hesitate to contact me if I can be of assistance to you before our next scheduled appointment.  Your next care management appointment is by telephone on 11/20/2023 at 9:30 am    Please call the care guide team at [phone] if you need to cancel, schedule, or reschedule an appointment.   Please call the Suicide and Crisis Lifeline: 988 go to [facility]gent [facility] [address], [place] [phone]) call 911 if you are experiencing a Mental Health or Behavioral Health Crisis or need someone to talk to.  Tobias CHARM [name] [name], PhD [facility], [facility] Social Worker Direct Dial: [phone]  Fax: [phone]

## 2023-11-09 ENCOUNTER — Encounter: Payer: Self-pay | Admitting: *Deleted

## 2023-11-09 ENCOUNTER — Telehealth: Payer: Self-pay | Admitting: *Deleted

## 2023-11-09 NOTE — Patient Instructions (Signed)
 Rollo DELENA Public - I am sorry I was unable to reach you today for our scheduled appointment. I work with Cox, Abigail, MD and am calling to support your healthcare needs. Please contact me at [phone] at your earliest convenience. I look forward to speaking with you soon.   Thank you,  Suzen L. Ramonita, RN, BSN, CCM [facility]  [facility], [facility] RN Care Manager Direct Dial: [phone]  Fax: [phone]

## 2023-11-10 ENCOUNTER — Other Ambulatory Visit: Payer: Self-pay | Admitting: Family Medicine

## 2023-11-13 ENCOUNTER — Telehealth: Payer: Self-pay

## 2023-11-13 NOTE — Progress Notes (Signed)
 Complex Care Management Care Guide Note  11/13/2023 Name: Hailey Greene MRN: [id number] DOB: [birth year]  Hailey Greene is a 73 y.o. year old female who is a primary care patient of Cox, Kirsten, MD and is actively engaged with the care management team. I reached out to Rollo DELE[employer] by phone today to assist with re-scheduling  with the RN Case Manager.  Follow up plan: Unsuccessful telephone outreach attempt made. A HIPAA compliant phone message was left for the patient providing contact information and requesting a return call.  Leotis Rase [facility] Guide  Direct Dial: [phone]  Fax [phone]

## 2023-11-13 NOTE — Progress Notes (Signed)
 Complex Care Management Care Guide Note  11/13/2023 Name: Hailey Greene MRN: [id number] DOB: 10/20/1950  Hailey Greene is a 73 y.o. year old female who is a primary care patient of Cox, Kirsten, MD and is actively engaged with the care management team. I reached out to Hailey Greene Public by phone today to assist with re-scheduling  with the RN Case Manager.  Follow up plan: Telephone appointment with complex care management team member scheduled for:  11/15/23 at 3:00 p.m.   Hailey Greene [facility]  [facility], [facility] VBCI Assistant Direct Dial: [phone]  Fax: [phone]

## 2023-11-14 ENCOUNTER — Other Ambulatory Visit: Payer: Self-pay | Admitting: *Deleted

## 2023-11-14 NOTE — Patient Outreach (Signed)
 Complex Care Management   Visit Note  11/14/2023  Name:  Hailey Greene MRN: [id number] DOB: 14-Dec-1950  Situation: Referral received for Complex Care Management related to Diabetes with Complications I obtained verbal consent from Patient.  Visit completed with Patient  on the phone  neurological conditions, muscle issues, or even anxiety and dehydration.   Neuropathy  Cbg 148 highest  Need scooter lift numotion  Eye in September [facility] in [place] new glasses Dr Malvina  Check on dental coverage $3,000 allowance for covered dental services like cleanings, fillings, crowns, root canals, extractions and denture Routine Eye Exam: The plan offers a $0 copay for a routine eye exam, according to [employer].  Eyewear Allowance: Members receive an allowance of up to $350 per year for frames or contact lenses.  Covered Lenses: Standard single vision, bifocal, trifocal, and progressive lenses are covered in full.   [place]  Background:   Past Medical History:  Diagnosis Date   2019 novel coronavirus disease (COVID-19) 12/31/2018   Acute hypoxemic respiratory failure due to COVID-19 (HCC)    Asthma    Bronchiectasis (HCC)    Chicken pox    Diabetes (HCC)    GERD (gastroesophageal reflux disease)    Hypertension    IBS (irritable bowel syndrome)    Major depressive disorder    Mixed hyperlipidemia    Neuromuscular disorder (HCC)    peripheral neuropathy   Neuropathy    Osteoarthritis    Pneumonia    x2   Primary insomnia    Primary osteoarthritis of left knee 05/31/2019   RLS (restless legs syndrome)    S/P total knee replacement 07/15/2019   Sleep apnea    does not use CPAP   Status post total knee replacement 07/25/2019   Tachycardia    Urge incontinence    UTI (lower urinary tract infection)    Weakness     Assessment: Patient Reported Symptoms:  Cognitive Cognitive Status: Able to follow simple commands, Alert and oriented to  person, place, and time, Normal speech and language skills, Insightful and able to interpret abstract concepts Cognitive/Intellectual Conditions Management [RPT]: None reported or documented in medical history or problem list   Health Maintenance Behaviors: Sleep adequate, Healthy diet Healing Pattern: Average Health Facilitated by: Healthy diet, Rest  Neurological Neurological Review of Symptoms: Vision changes, Numbness, Dizziness Neurological Management Strategies: Adequate rest, Routine screening, Medical device Neurological Self-Management Outcome: 3 (uncertain)  HEENT HEENT Symptoms Reported: Sudden change or loss of vision HEENT Management Strategies: Medical device, Routine screening HEENT Self-Management Outcome: 3 (uncertain)    Cardiovascular Cardiovascular Symptoms Reported: Lightheadness Does patient have uncontrolled Hypertension?: No Cardiovascular Management Strategies: Adequate rest, Medical device, Medication therapy, Routine screening Cardiovascular Self-Management Outcome: 3 (uncertain)  Respiratory Respiratory Symptoms Reported: No symptoms reported Respiratory Management Strategies: Adequate rest, Routine screening Respiratory Self-Management Outcome: 3 (uncertain)  Endocrine Endocrine Symptoms Reported: No symptoms reported Is patient diabetic?: Yes Is patient checking blood sugars at home?: Yes List most recent blood sugar readings, include date and time of day: hgA1c 5.6 Endocrine Self-Management Outcome: 4 (good)  Gastrointestinal Gastrointestinal Symptoms Reported: No symptoms reported      Genitourinary Genitourinary Symptoms Reported: No symptoms reported    Integumentary Integumentary Symptoms Reported: No symptoms reported Skin Self-Management Outcome: 4 (good)  Musculoskeletal Musculoskelatal Symptoms Reviewed: Difficulty walking, Limited mobility Additional Musculoskeletal Details: use a scooter Musculoskeletal Self-Management Outcome: 3  (uncertain)      Psychosocial Psychosocial Symptoms Reported: No symptoms reported  Behavioral Management Strategies: Support system, Adequate rest Behavioral Health Self-Management Outcome: 4 (good) Major Change/Loss/Stressor/Fears (CP): Medical condition, self, Resources Quality of Family Relationships: helpful, involved, supportive Do you feel physically threatened by others?: No    11/14/2023    PHQ2-9 Depression Screening   Little interest or pleasure in doing things Not at all  Feeling down, depressed, or hopeless Not at all  PHQ-2 - Total Score 0  Trouble falling or staying asleep, or sleeping too much    Feeling tired or having little energy    Poor appetite or overeating     Feeling bad about yourself - or that you are a failure or have let yourself or your family down    Trouble concentrating on things, such as reading the newspaper or watching television    Moving or speaking so slowly that other people could have noticed.  Or the opposite - being so fidgety or restless that you have been moving around a lot more than usual    Thoughts that you would be better off dead, or hurting yourself in some way    PHQ2-9 Total Score    If you checked off any problems, how difficult have these problems made it for you to do your work, take care of things at home, or get along with other people    Depression Interventions/Treatment      Vitals:   11/14/23 1532  BP: 121/66    Medications Reviewed Today   Medications were not reviewed in this encounter     Recommendation:   PCP Follow-up Specialty provider follow-up *** Continue Current Plan of Care ***  Follow Up Plan:   Telephone follow up appointment date/time:  11/23/23 3 pm   Meila Berke L. Ramonita, RN, BSN, CCM [place]  [facility], [facility] RN Care Manager Direct Dial: [phone]  Fax: [phone]

## 2023-11-16 ENCOUNTER — Encounter: Payer: Self-pay | Admitting: *Deleted

## 2023-11-16 ENCOUNTER — Ambulatory Visit (INDEPENDENT_AMBULATORY_CARE_PROVIDER_SITE_OTHER): Admitting: Physician Assistant

## 2023-11-16 ENCOUNTER — Ambulatory Visit: Admitting: Family Medicine

## 2023-11-16 ENCOUNTER — Encounter: Payer: Self-pay | Admitting: Physician Assistant

## 2023-11-16 VITALS — BP 128/78 | HR 94 | Temp 97.1°F | Ht 63.0 in | Wt 174.0 lb

## 2023-11-16 DIAGNOSIS — E1142 Type 2 diabetes mellitus with diabetic polyneuropathy: Secondary | ICD-10-CM

## 2023-11-16 DIAGNOSIS — F330 Major depressive disorder, recurrent, mild: Secondary | ICD-10-CM

## 2023-11-16 DIAGNOSIS — J4551 Severe persistent asthma with (acute) exacerbation: Secondary | ICD-10-CM | POA: Diagnosis not present

## 2023-11-16 DIAGNOSIS — G603 Idiopathic progressive neuropathy: Secondary | ICD-10-CM | POA: Diagnosis not present

## 2023-11-16 DIAGNOSIS — K219 Gastro-esophageal reflux disease without esophagitis: Secondary | ICD-10-CM

## 2023-11-16 DIAGNOSIS — E782 Mixed hyperlipidemia: Secondary | ICD-10-CM

## 2023-11-16 DIAGNOSIS — I10 Essential (primary) hypertension: Secondary | ICD-10-CM | POA: Diagnosis not present

## 2023-11-16 DIAGNOSIS — F331 Major depressive disorder, recurrent, moderate: Secondary | ICD-10-CM | POA: Insufficient documentation

## 2023-11-16 MED ORDER — VORTIOXETINE HBR 20 MG PO TABS: 20.0000 mg | ORAL_TABLET | Freq: Every day | ORAL | 0 refills | Status: AC

## 2023-11-16 MED ORDER — TOPIRAMATE 50 MG PO TABS: 50.0000 mg | ORAL_TABLET | Freq: Every day | ORAL | 0 refills | Status: AC

## 2023-11-16 MED ORDER — SOLIFENACIN SUCCINATE 5 MG PO TABS: 5.0000 mg | ORAL_TABLET | Freq: Every day | ORAL | 0 refills | Status: AC

## 2023-11-16 MED ORDER — ROSUVASTATIN CALCIUM 40 MG PO TABS: 40.0000 mg | ORAL_TABLET | Freq: Every day | ORAL | 0 refills | Status: AC

## 2023-11-16 MED ORDER — HYDROXYZINE PAMOATE 25 MG PO CAPS: 25.0000 mg | ORAL_CAPSULE | Freq: Every evening | ORAL | 0 refills | Status: DC | PRN

## 2023-11-16 MED ORDER — ROPINIROLE HCL 3 MG PO TABS: 3.0000 mg | ORAL_TABLET | Freq: Every day | ORAL | 0 refills | Status: AC

## 2023-11-16 NOTE — Progress Notes (Unsigned)
 Subjective:  Patient ID: Hailey Greene, female    DOB: 08-Mar-1951  Age: 73 y.o. MRN: [id number]  Chief Complaint  Patient presents with   12 WEEK FOLLOW UP    HPI:  Discussed the use of AI scribe software for clinical note transcription with the patient, who gave verbal consent to proceed.  History of Present Illness  Hailey Greene is a 73 year old female with a history of respiratory issues who presents with a worsening cough.  She has been experiencing a worsening cough over the past few days, accompanied by increased phlegm production. No recent fevers or chest pain. She uses oxygen  at home, which helps her when sitting and moving around. A long tube facilitates her mobility. She performs breathing treatments twice a day with a nebulizer, which she finds helpful.  She has been hospitalized fifteen times since November for various conditions including norovirus, regular flu, double pneumonia, and COVID-19.  She is taking medication prescribed by a pulmonologist and is scheduled for a follow-up appointment in September. No issues with her current medications, including Ozempic  and phentermine , and none of her medications have changed recently.  She has a history of breast cancer and recently underwent a lumpectomy. No issues since the procedure and was informed by her oncologist that radiation is not currently necessary. She is under regular surveillance at the cancer center.         11/14/2023    4:08 PM 10/09/2023   10:54 AM 09/28/2023    4:07 PM 09/28/2023    4:01 PM 08/23/2023   10:25 AM  Depression screen PHQ 2/9  Decreased Interest 0 0 0 0 0  Down, Depressed, Hopeless 0 0 0 0 0  PHQ - 2 Score 0 0 0 0 0  Altered sleeping  3     Tired, decreased energy  0     Change in appetite  0     Feeling bad or failure about yourself   0     Trouble concentrating  0     Moving slowly or fidgety/restless  0     Suicidal thoughts  0     PHQ-9 Score  3     Difficult doing  work/chores  Not difficult at all           08/23/2023   10:25 AM  Fall Risk   Falls in the past year? 1  Number falls in past yr: 1  Injury with Fall? 0  Risk for fall due to : History of fall(s)  Follow up Falls evaluation completed    Patient Care Team: Sherre Clapper, MD as PCP - General (Family Medicine) Rubie Kemps, MD as Consulting Physician (Orthopedic Surgery) Corlis Joen BIRCH, RN as Oncology Nurse Navigator [name] Lister D as Social Worker Ramonita Suzen CROME, RN as [facility] Care Management   Review of Systems  Constitutional:  Negative for appetite change, fatigue and fever.  HENT:  Negative for congestion, ear pain, sinus pressure and sore throat.   Respiratory:  Positive for cough and wheezing. Negative for chest tightness and shortness of breath.   Cardiovascular:  Negative for chest pain and palpitations.  Gastrointestinal:  Negative for abdominal pain, constipation, diarrhea, nausea and vomiting.  Genitourinary:  Negative for dysuria and hematuria.  Musculoskeletal:  Negative for arthralgias, back pain, joint swelling and myalgias.  Skin:  Negative for rash.  Neurological:  Negative for dizziness, weakness and headaches.  Psychiatric/Behavioral:  Negative for dysphoric mood. The patient is not  nervous/anxious.     Current Outpatient Medications on File Prior to Visit  Medication Sig Dispense Refill   Albuterol -Budesonide  (AIRSUPRA ) 90-80 MCG/ACT AERO Inhale 2 puffs into the lungs 4 (four) times daily as needed. 10.7 g 3   amitriptyline  (ELAVIL ) 25 MG tablet Take 1 tablet (25 mg total) by mouth every evening. 90 tablet 1   anastrozole  (ARIMIDEX ) 1 MG tablet Take 1 tablet (1 mg total) by mouth daily. 30 tablet 5   ASPIRIN  81 PO Take 81 mg by mouth daily.     Blood Glucose Monitoring Suppl DEVI 1 each by Does not apply route daily. E11.9 May substitute to any manufacturer covered by patient's insurance. 1 each 0   Blood Glucose Monitoring Suppl DEVI 1 each by Does not apply  route in the morning, at noon, and at bedtime. May substitute to any manufacturer covered by patient's insurance. 1 each 0   budeson-glycopyrrolate-formoterol  (BREZTRI  AEROSPHERE) 160-9-4.8 MCG/ACT AERO Inhale 2 puffs into the lungs in the morning and at bedtime.     carbamide peroxide (DEBROX) 6.5 % OTIC solution Place 5 drops into the left ear 2 (two) times daily. 15 mL 0   Cyanocobalamin  (VITAMIN B-12) 2500 MCG SUBL Place 1 tablet (2,500 mcg total) under the tongue daily. DISSOLVE 1 TABLET UNDER THE TONGUE DAILY 90 tablet 3   dicyclomine  (BENTYL ) 20 MG tablet TAKE 1 TABLET (20 MG TOTAL) BY MOUTH 4 (FOUR) TIMES DAILY AS NEEDED (INTESTINAL SPASMS). 360 tablet 1   Eszopiclone  3 MG TABS Take 1 tablet (3 mg total) by mouth at bedtime. 30 tablet 2   ezetimibe  (ZETIA ) 10 MG tablet TAKE 1 TABLET BY MOUTH EVERY DAY 90 tablet 1   fluticasone  (FLONASE ) 50 MCG/ACT nasal spray SPRAY 2 SPRAYS INTO EACH NOSTRIL EVERY DAY 48 g 2   glucose blood (ONETOUCH VERIO) test strip E11.42 Use new test strip each time when checking FBS 100 each 2   icosapent  Ethyl (VASCEPA ) 1 g capsule TAKE 2 CAPSULES BY MOUTH TWICE  DAILY 120 capsule 11   ipratropium-albuterol  (DUONEB) 0.5-2.5 (3) MG/3ML SOLN Take 3 mLs by nebulization every 6 (six) hours as needed (SOB. WHEEZING). 360 mL 1   Lancets (ONETOUCH DELICA PLUS LANCET33G) MISC E 11.42 Use new lancet each time when checking FBS 100 each 2   loratadine  (CLARITIN ) 10 MG tablet Take 1 tablet (10 mg total) by mouth daily. 90 tablet 3   mirtazapine  (REMERON ) 15 MG tablet Take 15 mg by mouth at bedtime.     Multiple Vitamin (MULTIVITAMIN WITH MINERALS) TABS tablet Take 1 tablet by mouth daily.     omeprazole  (PRILOSEC) 20 MG capsule TAKE 1 CAPSULE BY MOUTH EVERY DAY 90 capsule 1   ondansetron  (ZOFRAN -ODT) 4 MG disintegrating tablet Take 4 mg by mouth every 6 (six) hours as needed for nausea or vomiting.     phentermine  (ADIPEX-P ) 37.5 MG tablet Take 1 tablet (37.5 mg total) by mouth  every morning. 30 tablet 0   pregabalin  (LYRICA ) 300 MG capsule Take 1 capsule (300 mg total) by mouth 2 (two) times daily. 60 capsule 2   ramipril  (ALTACE ) 2.5 MG capsule TAKE 1 CAPSULE BY MOUTH EVERY DAY 90 capsule 1   Semaglutide , 2 MG/DOSE, (OZEMPIC , 2 MG/DOSE,) 8 MG/3ML SOPN INJECT 2 MG AS DIRECTED ONCE A WEEK. 3 mL 2   SYMBICORT  160-4.5 MCG/ACT inhaler INHALE 2 PUFFS INTO THE LUNGS TWICE A DAY 10.2 each 3   No current facility-administered medications on file prior to visit.  Past Medical History:  Diagnosis Date   2019 novel coronavirus disease (COVID-19) 12/31/2018   Acute hypoxemic respiratory failure due to COVID-19 (HCC)    Asthma    Bronchiectasis (HCC)    Chicken pox    Diabetes (HCC)    GERD (gastroesophageal reflux disease)    Hypertension    IBS (irritable bowel syndrome)    Major depressive disorder    Mixed hyperlipidemia    Neuromuscular disorder (HCC)    peripheral neuropathy   Neuropathy    Osteoarthritis    Pneumonia    x2   Primary insomnia    Primary osteoarthritis of left knee 05/31/2019   RLS (restless legs syndrome)    S/P total knee replacement 07/15/2019   Sleep apnea    does not use CPAP   Status post total knee replacement 07/25/2019   Tachycardia    Urge incontinence    UTI (lower urinary tract infection)    Weakness    Past Surgical History:  Procedure Laterality Date   ABDOMINAL HYSTERECTOMY     APPENDECTOMY     BREAST BIOPSY Right 04/26/2023   US  RT BREAST BX W LOC DEV 1ST LESION IMG BX SPEC US  GUIDE 04/26/2023 [facility]   BREAST BIOPSY  09/12/2023   US  RT RADIOACTIVE SEED LOC 09/12/2023 [facility]   BREAST LUMPECTOMY     CHOLECYSTECTOMY     EXCISION OF BREAST BIOPSY Right 09/13/2023   Procedure: EXCISION OF BREAST BIOPSY WITH RADIOACTIVE SEED GUIDED LOCALIZATION;  Surgeon: Belinda Cough, MD;  Location: [facility];  Service: General;  Laterality: Right;  Right breast rsl excisional biopsy   HERNIA REPAIR      REPLACEMENT TOTAL KNEE Right    TONSILLECTOMY     TOTAL KNEE ARTHROPLASTY Left 07/15/2019   Procedure: TOTAL KNEE ARTHROPLASTY;  Surgeon: Rubie Kemps, MD;  Location: [facility];  Service: Orthopedics;  Laterality: Left;   TUBAL LIGATION      Family History  Problem Relation Age of Onset   Thyroid  disease Mother    Cancer Mother    Migraines Mother    Brain cancer Father    Throat cancer Father    Cancer - Other Father    Heart disease Maternal Grandmother    Diabetes Daughter    Social History   Socioeconomic History   Marital status: Married    Spouse name: Alm   Number of children: 3   Years of education: 12   Highest education level: Not on file  Occupational History   Occupation: Housewife  Tobacco Use   Smoking status: Former    Current packs/day: 0.00    Average packs/day: 2.0 packs/day for 12.0 years (24.0 ttl pk-yrs)    Types: Cigarettes    Start date: 05/25/1998    Quit date: 05/25/2010    Years since quitting: 13.4   Smokeless tobacco: Never   Tobacco comments:    Used to smoke 1-2 packs a days  Vaping Use   Vaping status: Never Used  Substance and Sexual Activity   Alcohol use: Not Currently    Comment: past occasionally   Drug use: Never   Sexual activity: Not Currently    Birth control/protection: Surgical    Comment: hyst  Other Topics Concern   Not on file  Social History Narrative   Born and raised in [place], [place]. Currently lives in a house with her husband. 1 dog. Fun: Garden, feed birds, swimming   Denies any religious beliefs effecting health care.  Social Drivers of Health   Financial Resource Strain: Medium Risk (10/02/2023)   Overall Financial Resource Strain (CARDIA)    Difficulty of Paying Living Expenses: Somewhat hard  Food Insecurity: No Food Insecurity (10/02/2023)   Hunger Vital Sign    Worried About Running Out of Food in the Last Year: Never true    Ran Out of Food in the Last Year: Never true  Recent Concern: Food Insecurity -  Food Insecurity Present (08/19/2023)   Hunger Vital Sign    Worried About Running Out of Food in the Last Year: Sometimes true    Ran Out of Food in the Last Year: Sometimes true  Transportation Needs: No Transportation Needs (10/02/2023)   PRAPARE - Administrator, Civil Service (Medical): No    Lack of Transportation (Non-Medical): No  Physical Activity: Insufficiently Active (08/19/2023)   Exercise Vital Sign    Days of Exercise per Week: 4 days    Minutes of Exercise per Session: 20 min  Stress: No Stress Concern Present (09/28/2023)   [name] of Occupational Health - Occupational Stress Questionnaire    Feeling of Stress: Not at all  Recent Concern: Stress - Stress Concern Present (08/19/2023)   [name] of Occupational Health - Occupational Stress Questionnaire    Feeling of Stress : Rather much  Social Connections: Socially Integrated (08/19/2023)   Social Connection and Isolation Panel    Frequency of Communication with Friends and Family: More than three times a week    Frequency of Social Gatherings with Friends and Family: Three times a week    Attends Religious Services: More than 4 times per year    Active Member of Clubs or Organizations: Yes    Attends Engineer, structural: More than 4 times per year    Marital Status: Married    Objective:  BP 128/78 (BP Location: Left Arm, Patient Position: Sitting)   Pulse 94   Temp (!) 97.1 F (36.2 C) (Temporal)   Ht 5' 3 (1.6 m)   Wt 174 lb (78.9 kg)   SpO2 97%   BMI 30.82 kg/m      11/16/2023   11:27 AM 11/14/2023    3:32 PM 11/01/2023   10:48 AM  BP/Weight  Systolic BP 128 121 110  Diastolic BP 78 66 60  Wt. (Lbs) 174  165  BMI 30.82 kg/m2  29.23 kg/m2    Physical Exam Vitals reviewed.  Constitutional:      Appearance: Normal appearance.  Neck:     Vascular: No carotid bruit.  Cardiovascular:     Rate and Rhythm: Normal rate and regular rhythm.     Heart sounds: Normal heart  sounds.  Pulmonary:     Effort: Pulmonary effort is normal.     Breath sounds: Normal breath sounds.  Abdominal:     General: Bowel sounds are normal.     Palpations: Abdomen is soft.     Tenderness: There is no abdominal tenderness.  Neurological:     Mental Status: She is alert and oriented to person, place, and time.  Psychiatric:        Mood and Affect: Mood normal.        Behavior: Behavior normal.      Lab Results  Component Value Date   WBC 8.4 09/28/2023   HGB 13.4 09/28/2023   HCT 41.3 09/28/2023   PLT 226 09/28/2023   GLUCOSE 109 (H) 09/28/2023   CHOL 241 (H) 08/23/2023   TRIG  122 08/23/2023   HDL 61 08/23/2023   LDLCALC 158 (H) 08/23/2023   ALT 8 09/28/2023   AST 14 (L) 09/28/2023   NA 137 09/28/2023   K 3.9 09/28/2023   CL 102 09/28/2023   CREATININE 1.03 (H) 09/28/2023   BUN 23 09/28/2023   CO2 22 09/28/2023   TSH 0.966 08/23/2023   HGBA1C 5.6 08/23/2023   MICROALBUR 30 09/25/2020      Assessment & Plan:  Diabetic polyneuropathy associated with type 2 diabetes mellitus (HCC) Assessment & Plan: Control: Well controlled Recommend check sugars fasting daily. Recommend check feet daily. Recommend annual eye exams. Medicines: Ozempic  2 mg weekly Continue to work on eating a healthy diet and exercise.     Essential hypertension, benign Assessment & Plan: Well controlled.  No medicines.  Continue to work on eating a healthy diet and exercise.     Idiopathic progressive neuropathy Assessment & Plan: The current medical regimen is effective;  continue present plan and medications. Lyrica  300 mg twice daily.   Asthma, severe persistent, poorly-controlled, with acute exacerbation Assessment & Plan: Continue on breztri  2 puffs twice daily and airsupra . Continue duoneb.     Mixed hyperlipidemia Assessment & Plan: Well controlled.  No changes to medicines. Continue on crestor , zetia  and vascepa . Continue to work on eating a healthy diet  and exercise.    Orders: -     Rosuvastatin  Calcium ; Take 1 tablet (40 mg total) by mouth daily.  Dispense: 90 tablet; Refill: 0  Gastro-esophageal reflux disease without esophagitis Assessment & Plan: The current medical regimen is effective;  continue present plan and medications.    Mild recurrent major depression (HCC) Assessment & Plan: Continue Trintellix     Other orders -     Topiramate ; Take 1 tablet (50 mg total) by mouth daily.  Dispense: 90 tablet; Refill: 0 -     Vortioxetine  HBr; Take 1 tablet (20 mg total) by mouth daily.  Dispense: 90 tablet; Refill: 0 -     Solifenacin  Succinate; Take 1 tablet (5 mg total) by mouth daily.  Dispense: 90 tablet; Refill: 0 -     rOPINIRole  HCl; Take 1 tablet (3 mg total) by mouth daily.  Dispense: 90 tablet; Refill: 0 -     hydrOXYzine  Pamoate; Take 1 capsule (25 mg total) by mouth at bedtime as needed.  Dispense: 90 capsule; Refill: 0     Meds ordered this encounter  Medications   topiramate  (TOPAMAX ) 50 MG tablet    Sig: Take 1 tablet (50 mg total) by mouth daily.    Dispense:  90 tablet    Refill:  0   vortioxetine  HBr (TRINTELLIX ) 20 MG TABS tablet    Sig: Take 1 tablet (20 mg total) by mouth daily.    Dispense:  90 tablet    Refill:  0    This prescription was filled on 10/07/2022. Any refills authorized will be placed on file.   solifenacin  (VESICARE ) 5 MG tablet    Sig: Take 1 tablet (5 mg total) by mouth daily.    Dispense:  90 tablet    Refill:  0   rOPINIRole  (REQUIP ) 3 MG tablet    Sig: Take 1 tablet (3 mg total) by mouth daily.    Dispense:  90 tablet    Refill:  0   rosuvastatin  (CRESTOR ) 40 MG tablet    Sig: Take 1 tablet (40 mg total) by mouth daily.    Dispense:  90 tablet    Refill:  0   hydrOXYzine  (VISTARIL ) 25 MG capsule    Sig: Take 1 capsule (25 mg total) by mouth at bedtime as needed.    Dispense:  90 capsule    Refill:  0    No orders of the defined types were placed in this encounter.     Follow-up: Return in about 2 months (around 01/16/2024) for Dr. Sherre.   I,Lauren M Auman,acting as a Neurosurgeon for [employer], PA.,have documented all relevant documentation on the behalf of Nola Angles, PA,as directed by  Nola Angles, PA while in the presence of Nola Angles, [place].   LILLETTE Kato I Leal-Borjas,acting as a scribe for Nola Angles, PA.,have documented all relevant documentation on the behalf of Nola Angles, PA,as directed by  Nola Angles, PA while in the presence of Nola Angles, [place].    An After Visit Summary was printed and given to the patient.  Nola Angles, [place] [facility] [phone]

## 2023-11-19 NOTE — Assessment & Plan Note (Signed)
 Continue Trintellix

## 2023-11-19 NOTE — Assessment & Plan Note (Signed)
 Control: Well controlled Recommend check sugars fasting daily. Recommend check feet daily. Recommend annual eye exams. Medicines: Ozempic  2 mg weekly Continue to work on eating a healthy diet and exercise.

## 2023-11-19 NOTE — Assessment & Plan Note (Signed)
 The current medical regimen is effective;  continue present plan and medications.

## 2023-11-19 NOTE — Assessment & Plan Note (Signed)
 The current medical regimen is effective;  continue present plan and medications. Lyrica 300 mg twice daily.

## 2023-11-19 NOTE — Assessment & Plan Note (Addendum)
 Well controlled.  No changes to medicines. Continue on crestor , zetia  and vascepa . Continue to work on eating a healthy diet and exercise.

## 2023-11-19 NOTE — Assessment & Plan Note (Signed)
 Well controlled.  No medicines.  Continue to work on eating a healthy diet and exercise.

## 2023-11-19 NOTE — Assessment & Plan Note (Signed)
 Continue on breztri  2 puffs twice daily and airsupra . Continue duoneb.

## 2023-11-20 ENCOUNTER — Other Ambulatory Visit: Payer: Self-pay | Admitting: Licensed Clinical Social Worker

## 2023-11-20 ENCOUNTER — Encounter: Payer: Self-pay | Admitting: Licensed Clinical Social Worker

## 2023-11-23 ENCOUNTER — Other Ambulatory Visit: Payer: Self-pay

## 2023-11-23 ENCOUNTER — Other Ambulatory Visit: Payer: Self-pay | Admitting: *Deleted

## 2023-11-23 ENCOUNTER — Telehealth: Payer: Self-pay | Admitting: Licensed Clinical Social Worker

## 2023-11-23 NOTE — Patient Instructions (Signed)
 Visit Information  Thank you for taking time to visit with me today. Please don't hesitate to contact me if I can be of assistance to you before our next scheduled appointment.  Your next care management appointment is by telephone on 12/26/23 at 3 pm  The [place] assistive technology program will not be of any assistance for a lift + they charge various fees for services not covered by medicare nor medicaid. They provide  device demonstrations, loans and training, technical assistance only. Contact the Intake Coordinator if you have any additional questions or concerns. [phone] (Office) [phone] (Cell)   Please call the care guide team at [phone] if you need to cancel, schedule, or reschedule an appointment.   Please call the Suicide and Crisis Lifeline: 988 call the [place]  National Suicide Prevention Lifeline: 901-359-5872 or TTY: 727-460-4796 TTY 403-732-2019) to talk to a trained counselor call 1-800-273-TALK (toll free, 24 hour hotline) call 911 if you are experiencing a Mental Health or Behavioral Health Crisis or need someone to talk to.   Dartagnan Beavers L. Ramonita, RN, BSN, CCM [place]  [facility], [facility] RN Care Manager Direct Dial: [phone]  Fax: [phone]

## 2023-11-23 NOTE — Patient Outreach (Signed)
 Complex Care Management   Visit Note  11/23/2023  Name:  Hailey Greene MRN: [id number] DOB: 07-04-1950  Situation: Referral received for Complex Care Management related to Diabetes with Complications I obtained verbal consent from Patient.  Visit completed with Patient  on the phone  Background:   Past Medical History:  Diagnosis Date   2019 novel coronavirus disease (COVID-19) 12/31/2018   Acute hypoxemic respiratory failure due to COVID-19 (HCC)    Asthma    Bronchiectasis (HCC)    Chicken pox    Diabetes (HCC)    GERD (gastroesophageal reflux disease)    Hypertension    IBS (irritable bowel syndrome)    Major depressive disorder    Mixed hyperlipidemia    Neuromuscular disorder (HCC)    peripheral neuropathy   Neuropathy    Osteoarthritis    Pneumonia    x2   Primary insomnia    Primary osteoarthritis of left knee 05/31/2019   RLS (restless legs syndrome)    S/P total knee replacement 07/15/2019   Sleep apnea    does not use CPAP   Status post total knee replacement 07/25/2019   Tachycardia    Urge incontinence    UTI (lower urinary tract infection)    Weakness    Assessment: Patient Reported Symptoms:  Cognitive Cognitive Status: No symptoms reported      Neurological Neurological Review of Symptoms: Vision changes, Numbness Oher Neurological Symptoms/Conditions [RPT]: She reports at times she experiences shaking all over her body with numbness that makes her knee buckles She fall if not holding on to somethin Hx of neuropathy Neurological Management Strategies: Medication therapy, Routine screening, Medical device Neurological Self-Management Outcome: 3 (uncertain)  HEENT HEENT Symptoms Reported: Sudden change or loss of vision HEENT Management Strategies: Medical device, Routine screening HEENT Self-Management Outcome: 3 (uncertain)    Cardiovascular Cardiovascular Symptoms Reported: No symptoms reported Does patient have uncontrolled Hypertension?:  No Cardiovascular Self-Management Outcome: 4 (good)  Respiratory Respiratory Symptoms Reported: No symptoms reported Respiratory Self-Management Outcome: 4 (good)  Endocrine Endocrine Symptoms Reported: No symptoms reported Is patient diabetic?: Yes Is patient checking blood sugars at home?: Yes List most recent blood sugar readings, include date and time of day: hga1c 5.6 checks feet daily takes ozempic  Endocrine Self-Management Outcome: 4 (good)  Gastrointestinal Gastrointestinal Symptoms Reported: No symptoms reported Gastrointestinal Management Strategies: Fluid modification Gastrointestinal Self-Management Outcome: 4 (good)    Genitourinary Genitourinary Symptoms Reported: No symptoms reported Genitourinary Management Strategies: Fluid modification Genitourinary Self-Management Outcome: 4 (good)  Integumentary Integumentary Symptoms Reported: No symptoms reported Skin Self-Management Outcome: 4 (good)  Musculoskeletal Musculoskelatal Symptoms Reviewed: Difficulty walking, Limited mobility Musculoskeletal Management Strategies: Medical device, Routine screening Musculoskeletal Self-Management Outcome: 3 (uncertain) Musculoskeletal Comment: s/p TKR osteoarthritis of left knee, RLS, peripheral neuropathty   Patient at Risk for Falls Due to: Impaired balance/gait, Impaired mobility Fall risk Follow up: Falls evaluation completed, Falls prevention discussed  Psychosocial Psychosocial Symptoms Reported: No symptoms reported Behavioral Health Self-Management Outcome: 4 (good) Techniques to Cope with Loss/Stress/Change: Diversional activities Quality of Family Relationships: helpful, involved, supportive Do you feel physically threatened by others?: No    11/23/2023    PHQ2-9 Depression Screening   Little interest or pleasure in doing things Not at all  Feeling down, depressed, or hopeless Not at all  PHQ-2 - Total Score 0  Trouble falling or staying asleep, or sleeping too much     Feeling tired or having little energy    Poor appetite or overeating  Feeling bad about yourself - or that you are a failure or have let yourself or your family down    Trouble concentrating on things, such as reading the newspaper or watching television    Moving or speaking so slowly that other people could have noticed.  Or the opposite - being so fidgety or restless that you have been moving around a lot more than usual    Thoughts that you would be better off dead, or hurting yourself in some way    PHQ2-9 Total Score    If you checked off any problems, how difficult have these problems made it for you to do your work, take care of things at home, or get along with other people    Depression Interventions/Treatment      There were no vitals filed for this visit.  Medications Reviewed Today     Reviewed by Ramonita Suzen CROME, RN (Registered Nurse) on 11/23/23 at 1913  Med List Status: <None>   Medication Order Taking? Sig Documenting Provider Last Dose Status Informant  Albuterol -Budesonide  (AIRSUPRA ) 90-80 MCG/ACT AERO [id number] Yes Inhale 2 puffs into the lungs 4 (four) times daily as needed. Cox, Kirsten, MD  Active   amitriptyline  (ELAVIL ) 25 MG tablet [id number] Yes Take 1 tablet (25 mg total) by mouth every evening. Cox, Kirsten, MD  Active Self, Pharmacy Records, Spouse/Significant Other  anastrozole  (ARIMIDEX ) 1 MG tablet [id number] Yes Take 1 tablet (1 mg total) by mouth daily. Cornelius Wanda DEL, MD  Active   ASPIRIN  81 PO [id number] Yes Take 81 mg by mouth daily. [provider]  Active Self, Pharmacy Records, Spouse/Significant Other  Blood Glucose Monitoring Suppl DEVI [id number] Yes 1 each by Does not apply route daily. E11.9 May substitute to any manufacturer covered by patient's insurance. Sherre Clapper, MD  Active Self, Pharmacy Records, Spouse/Significant Other  Blood Glucose Monitoring Suppl DEVI [id number] Yes 1 each by Does not apply route in the morning, at  noon, and at bedtime. May substitute to any manufacturer covered by patient's insurance. CoxClapper, MD  Active Self, Pharmacy Records, Spouse/Significant Other  budeson-glycopyrrolate-formoterol  (BREZTRI  AEROSPHERE) 160-9-4.8 MCG/ACT TERESE [id number] Yes Inhale 2 puffs into the lungs in the morning and at bedtime. Cox, Kirsten, MD  Active Self, Pharmacy Records, Spouse/Significant Other  carbamide peroxide (DEBROX) 6.5 % OTIC solution [id number] Yes Place 5 drops into the left ear 2 (two) times daily. Teressa Harrie HERO, FNP  Active Self, Pharmacy Records, Spouse/Significant Other  Cyanocobalamin  (VITAMIN B-12) 2500 MCG SUBL [id number] Yes Place 1 tablet (2,500 mcg total) under the tongue daily. DISSOLVE 1 TABLET UNDER THE TONGUE DAILY Cox, Kirsten, MD  Active Self, Pharmacy Records, Spouse/Significant Other  dicyclomine  (BENTYL ) 20 MG tablet [id number] Yes TAKE 1 TABLET (20 MG TOTAL) BY MOUTH 4 (FOUR) TIMES DAILY AS NEEDED (INTESTINAL SPASMS). Sherre Clapper, MD  Active   Eszopiclone  3 MG TABS [id number] Yes Take 1 tablet (3 mg total) by mouth at bedtime. Cox, Kirsten, MD  Active   ezetimibe  (ZETIA ) 10 MG tablet [id number] Yes TAKE 1 TABLET BY MOUTH EVERY DAY Cox, Kirsten, MD  Active   fluticasone  (FLONASE ) 50 MCG/ACT nasal spray [id number] Yes SPRAY 2 SPRAYS INTO EACH NOSTRIL EVERY DAY Cox, Kirsten, MD  Active Self, Pharmacy Records, Spouse/Significant Other  glucose blood (ONETOUCH VERIO) test strip [id number] Yes E11.42 Use new test strip each time when checking FBS Cox, Kirsten, MD  Active Self, Pharmacy Records, Spouse/Significant Other  hydrOXYzine  (VISTARIL ) 25 MG capsule [id number] Yes Take 1 capsule (  25 mg total) by mouth at bedtime as needed. Milon Cleaves, PA  Active   icosapent  Ethyl (VASCEPA ) 1 g capsule [id number] Yes TAKE 2 CAPSULES BY MOUTH TWICE  DAILY Cox, Kirsten, MD  Active Self, Pharmacy Records, Spouse/Significant Other  ipratropium-albuterol  (DUONEB) 0.5-2.5 (3) MG/3ML SOLN [id number] Yes Take 3  mLs by nebulization every 6 (six) hours as needed (SOB. WHEEZING). CoxAbigail, MD  Active Self, Pharmacy Records, Spouse/Significant Other  Lancets [facility] CATHRYNE PLUS Minor) [place] [id number] Yes E 11.42 Use new lancet each time when checking FBS Cox, Kirsten, MD  Active Self, Pharmacy Records, Spouse/Significant Other  loratadine  (CLARITIN ) 10 MG tablet [id number] Yes Take 1 tablet (10 mg total) by mouth daily. CoxAbigail, MD  Active Self, Pharmacy Records, Spouse/Significant Other  mirtazapine  (REMERON ) 15 MG tablet [id number] Yes Take 15 mg by mouth at bedtime. [provider]  Active   Multiple Vitamin (MULTIVITAMIN WITH MINERALS) TABS tablet [id number] Yes Take 1 tablet by mouth daily. [provider]  Active Spouse/Significant Other, Self, Pharmacy Records  omeprazole  (PRILOSEC) 20 MG capsule [id number] Yes TAKE 1 CAPSULE BY MOUTH EVERY DAY Cox, Kirsten, MD  Active   ondansetron  (ZOFRAN -ODT) 4 MG disintegrating tablet [id number] Yes Take 4 mg by mouth every 6 (six) hours as needed for nausea or vomiting. [provider]  Active Self, Pharmacy Records, Spouse/Significant Other  phentermine  (ADIPEX-P ) 37.5 MG tablet [id number] Yes Take 1 tablet (37.5 mg total) by mouth every morning. Cox, Kirsten, MD  Active Self, Pharmacy Records, Spouse/Significant Other  pregabalin  (LYRICA ) 300 MG capsule [id number] Yes Take 1 capsule (300 mg total) by mouth 2 (two) times daily. CoxAbigail, MD  Active Self, Pharmacy Records, Spouse/Significant Other  ramipril  (ALTACE ) 2.5 MG capsule [id number] Yes TAKE 1 CAPSULE BY MOUTH EVERY DAY Cox, Kirsten, MD  Active   rOPINIRole  (REQUIP ) 3 MG tablet [id number] Yes Take 1 tablet (3 mg total) by mouth daily. Milon Cleaves, PA  Active   rosuvastatin  (CRESTOR ) 40 MG tablet [id number] Yes Take 1 tablet (40 mg total) by mouth daily. Milon Cleaves, PA  Active   Semaglutide , 2 MG/DOSE, (OZEMPIC , 2 MG/DOSE,) 8 MG/3ML SOPN [id number] Yes INJECT 2 MG AS DIRECTED  ONCE A WEEK. Cox, Kirsten, MD  Active   solifenacin  (VESICARE ) 5 MG tablet [id number] Yes Take 1 tablet (5 mg total) by mouth daily. Milon Cleaves, [place]  Active   SYMBICORT  160-4.5 MCG/ACT inhaler [id number] Yes INHALE 2 PUFFS INTO THE LUNGS TWICE A DAY Cox, Kirsten, MD  Active   topiramate  (TOPAMAX ) 50 MG tablet [id number] Yes Take 1 tablet (50 mg total) by mouth daily. Milon Cleaves, PA  Active   vortioxetine  HBr (TRINTELLIX ) 20 MG TABS tablet [id number] Yes Take 1 tablet (20 mg total) by mouth daily. Milon Cleaves, [place]  Active             Recommendation:   PCP Follow-up Specialty provider follow-up oncology 01/11/24 Continue Current Plan of Care Follow up with [facility]  Follow Up Plan:   Telephone follow up appointment date/time:  12/26/23  Suzen L. Ramonita, RN, BSN, CCM [place]  [facility], [facility] RN Care Manager Direct Dial: [phone]  Fax: [phone]

## 2023-11-24 ENCOUNTER — Other Ambulatory Visit: Admitting: *Deleted

## 2023-11-24 NOTE — Patient Outreach (Signed)
 Received a call from patient Unsuccessful return call  Left voice message  [place] 12/26/23  Kasem Mozer L. Ramonita, RN, BSN, CCM [place]  [facility], [facility] RN Care Manager Direct Dial: [phone]  Fax: [phone]

## 2023-11-24 NOTE — Patient Instructions (Signed)
 Rollo DELENA Public - I am sorry I was unable to reach you today for our scheduled appointment. I work with Cox, Abigail, MD and am calling to support your healthcare needs. Please contact me at [phone] at your earliest convenience. I look forward to speaking with you soon.   Thank you,  Suzen L. Ramonita, RN, BSN, CCM [facility]  [facility], [facility] RN Care Manager Direct Dial: [phone]  Fax: [phone]

## 2023-11-28 ENCOUNTER — Other Ambulatory Visit: Admitting: Licensed Clinical Social Worker

## 2023-11-28 ENCOUNTER — Encounter: Payer: Self-pay | Admitting: Licensed Clinical Social Worker

## 2023-11-30 ENCOUNTER — Other Ambulatory Visit: Payer: Self-pay

## 2023-11-30 ENCOUNTER — Telehealth: Payer: Self-pay

## 2023-11-30 IMAGING — MR MR HEAD W/O CM
10 series · 48 of 48 positions shown · non-contrast
Comparison: 09/23/2013

CLINICAL DATA: Acute neurologic deficit

EXAM:
MRI HEAD WITHOUT CONTRAST
TECHNIQUE: Multiplanar, multiecho pulse sequences of the brain and surrounding
structures were obtained without intravenous contrast.

[Series 2: T1 · sagittal · 5.0mm · 0.45mm/px · 3 of 25 slices shown (1 of 2)]
[im 1/25]
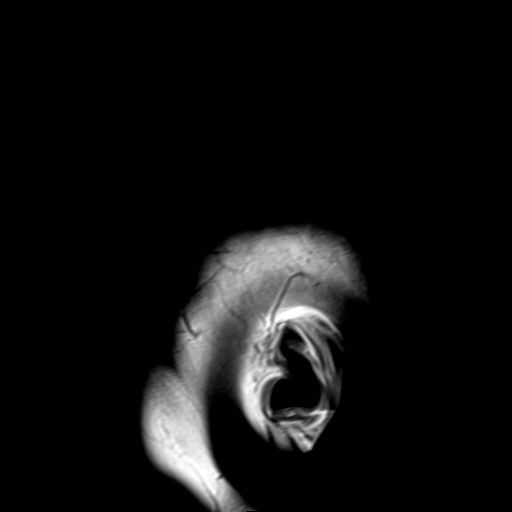
[im 13/25]
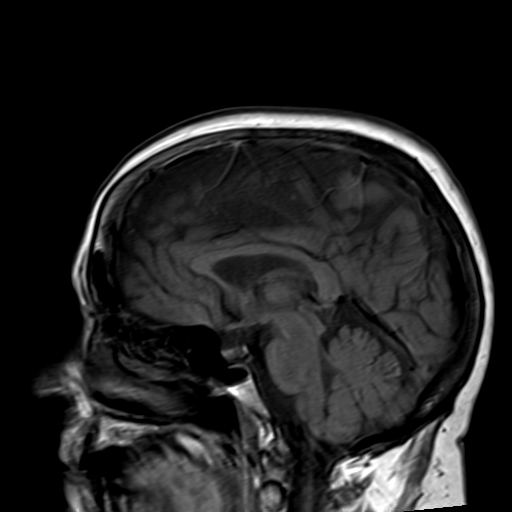
[im 25/25]
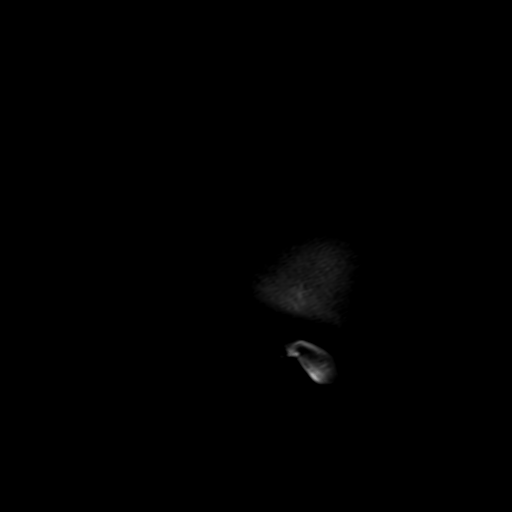

[Series 3: ax ep2d_diff_3 · axial · 3.0mm · 1.80mm/px · z∈[-81,+69]mm · 9 of 102 slices shown]
[im 1/102]
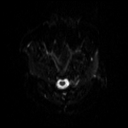
[im 13/102]
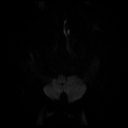
[im 26/102]
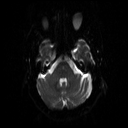
[im 38/102]
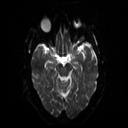
[im 51/102]
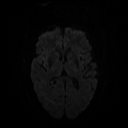
[im 64/102]
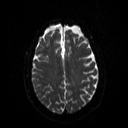
[im 76/102]
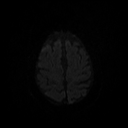
[im 89/102]
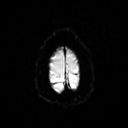
[im 102/102]
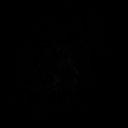

[Series 4: ax ep2d_diff_3_adc · axial · 3.0mm · 1.80mm/px · z∈[-81,+69]mm · 4 of 48 slices shown]
[im 1/48]
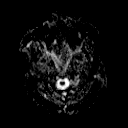
[im 16/48]
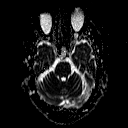
[im 32/48]
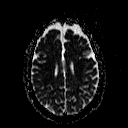
[im 48/48]
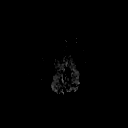

[Series 5: cor ep2d_diffusion · coronal · 5.0mm · 1.77mm/px · 5 of 60 slices shown]
[im 1/60]
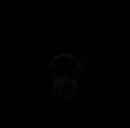
[im 15/60]
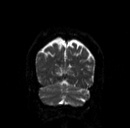
[im 30/60]
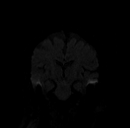
[im 45/60]
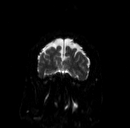
[im 60/60]
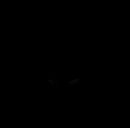

[Series 6: cor ep2d_diffusion_adc · coronal · 5.0mm · 1.77mm/px · 2 of 30 slices shown]
[im 1/30]
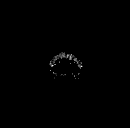
[im 30/30]
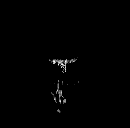

[Series 7: T2 · axial · 5.0mm · 0.51mm/px · z∈[-83,+70]mm · 2 of 27 slices shown (1 of 2)]
[im 1/27]
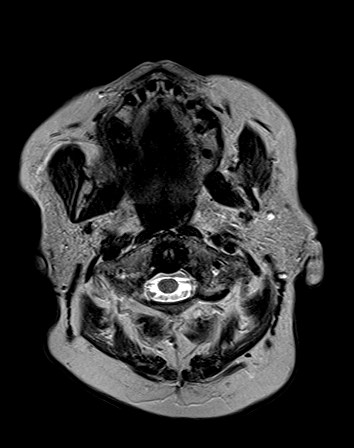
[im 27/27]
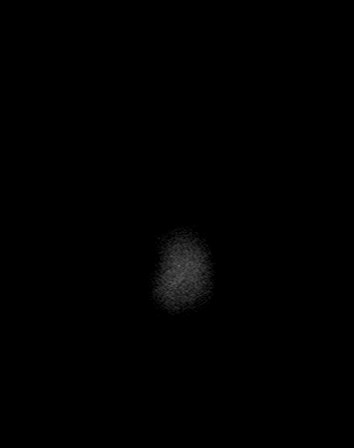

[Series 9: swi_images · axial · 3.0mm · 0.90mm/px · z∈[-82,+69]mm · 4 of 52 slices shown]
[im 1/52]
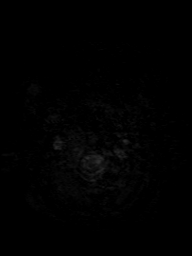
[im 18/52]
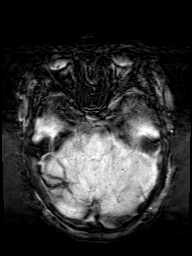
[im 35/52]
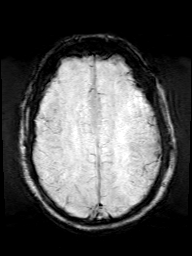
[im 52/52]
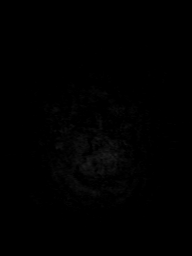

[Series 10: FLAIR · axial · 3.0mm · 0.45mm/px · z∈[-84,+68]mm · 3 of 40 slices shown]
[im 1/40]
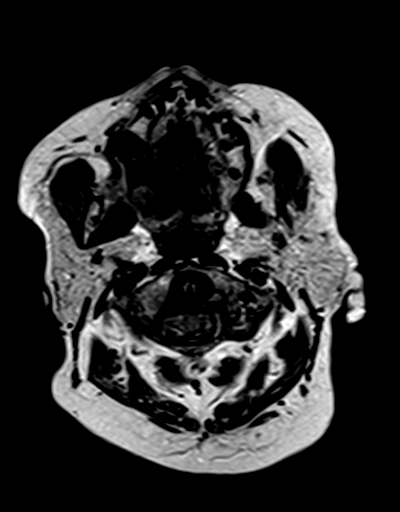
[im 20/40]
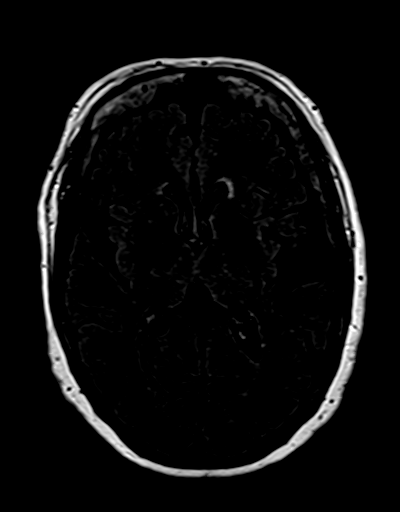
[im 40/40]
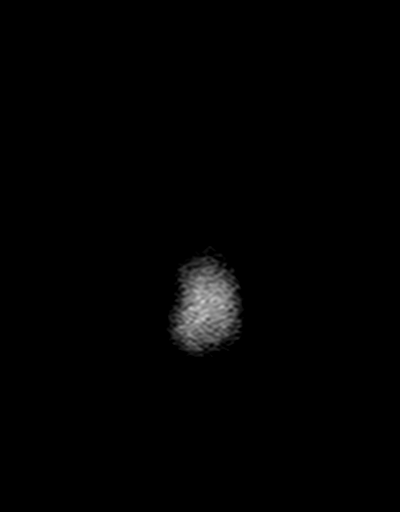

[Series 11: T1 · axial · 1.0mm · 0.78mm/px · z∈[-83,+74]mm · 13 of 160 slices shown (2 of 2)]
[im 1/160]
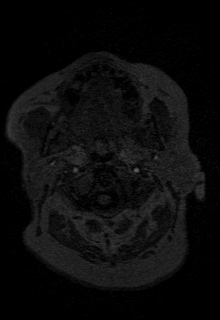
[im 14/160]
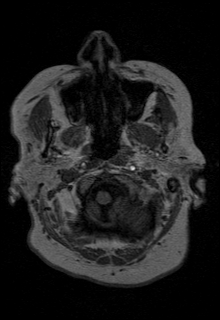
[im 27/160]
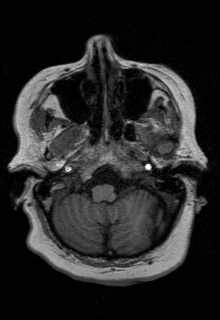
[im 40/160]
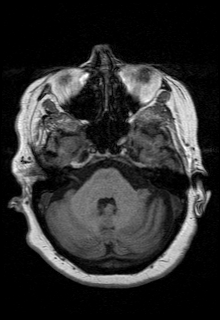
[im 54/160]
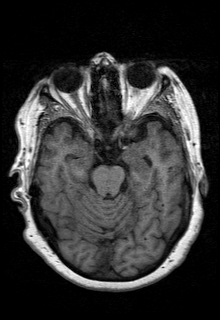
[im 67/160]
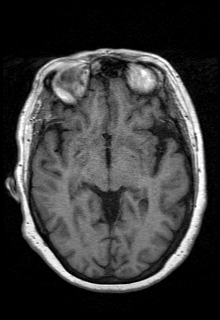
[im 80/160]
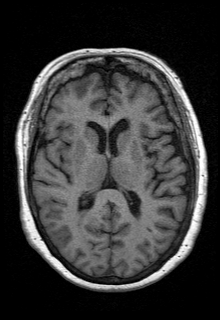
[im 93/160]
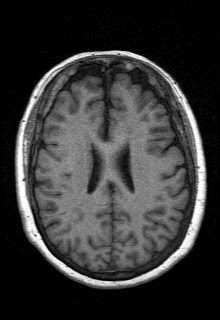
[im 107/160]
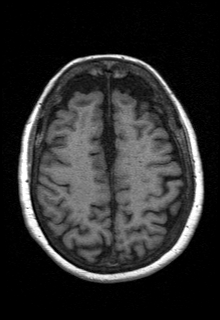
[im 120/160]
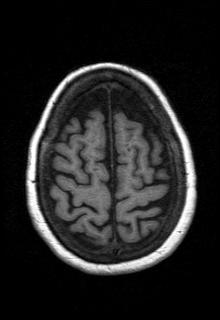
[im 133/160]
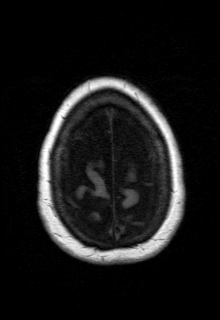
[im 146/160]
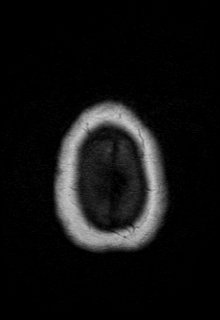
[im 160/160]
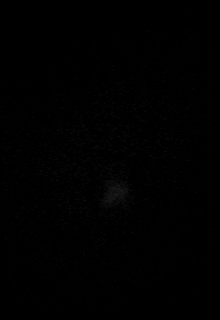

[Series 12: T2 · coronal · 5.0mm · 0.43mm/px · 3 of 32 slices shown (2 of 2)]
[im 1/32]
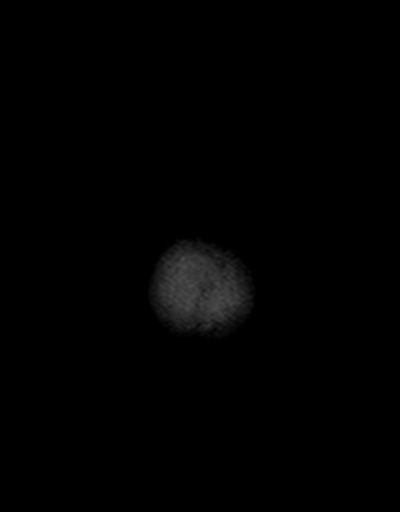
[im 16/32]
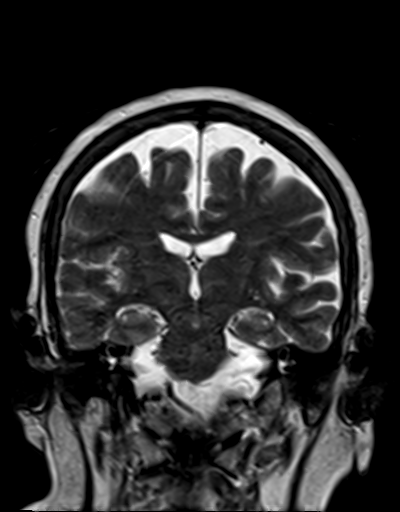
[im 32/32]
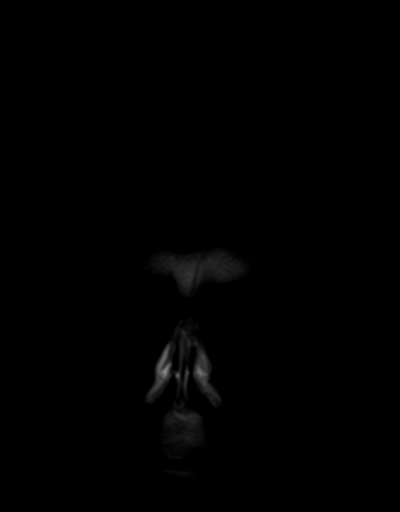

[48 of 48 positions shown; findings below may reference images not displayed]

FINDINGS: Brain: No acute infarct, mass effect or extra-axial collection. No
acute or chronic hemorrhage. Normal white matter signal, parenchymal
volume and CSF spaces. The midline structures are normal.

Vascular: Major flow voids are preserved.

Skull and upper cervical spine: Normal calvarium and skull base.
Visualized upper cervical spine and soft tissues are normal.

Sinuses/Orbits:No paranasal sinus fluid levels or advanced mucosal
thickening. No mastoid or middle ear effusion. Normal orbits.
IMPRESSION: Normal brain MRI.

## 2023-11-30 NOTE — Patient Instructions (Signed)
 Visit Information  Ms. Kareem was given information about Medicaid Managed Care team care coordination services as a part of their [facility] Community Plan Medicaid benefit.   If you would like to schedule transportation through your [facility], please call the following number at least 2 days in advance of your appointment: [phone]   Rides for urgent appointments can also be made after hours by calling Member Services.  Call the Behavioral Health Crisis Line at [phone], at any time, 24 hours a day, 7 days a week. If you are in danger or need immediate medical attention call 911.   Ms. Muriel - following are the goals we discussed in your visit today:   Goals Addressed             This Visit's Progress    VBCI RN Care Plan       Problems:  Chronic Disease Management support and education needs related to DMII  Goal: Over the next 90 days the Patient will attend all scheduled medical appointments: with providers as evidenced by encounter notes in Epic for patient appointment         continue to work with RN Care Manager and/or Social Worker to address care management and care coordination needs related to DMII as evidenced by adherence to care management team scheduled appointments     not experience hospital admission as evidenced by review of electronic medical record. Hospital Admissions in last 6 months = 1 take all medications exactly as prescribed and will call provider for medication related questions as evidenced by communication with Provider for medication questions and verbalizing compliance    verbalize understanding of plan for management of Diabetes II  as evidenced by confirming daily FSBS, medication adherence and diet modifications   Interventions:   Diabetes Interventions: Assessed patient's understanding of A1c goal: <6.5% Provided education to patient about basic DM disease process Reviewed medications with patient and  discussed importance of medication adherence Discussed plans with patient for ongoing care management follow up and provided patient with direct contact information for care management team Provided patient with written educational materials related to hypo and hyperglycemia and importance of correct treatment Reviewed scheduled/upcoming provider appointments including: 12-26-2023 telephonic appointment with Dr Sherre  Screening for signs and symptoms of depression related to chronic disease state  Assessed social determinant of health barriers    Patient Self-Care Activities:  Attend all scheduled provider appointments Call pharmacy for medication refills 3-7 days in advance of running out of medications Call provider office for new concerns or questions  Notify RN Care Manager of TOC call rescheduling needs Perform all self care activities independently  Take medications as prescribed   schedule appointment with eye doctor check blood sugar at prescribed times: once daily check feet daily for cuts, sores or redness enter blood sugar readings and medication or insulin  into daily log take the blood sugar log to all doctor visits set goal weight drink 6 to 8 glasses of water  each day eat fish at least once per week fill half of plate with vegetables limit fast food meals to no more than 1 per week set a realistic goal wash and dry feet carefully every day wear comfortable, cotton socks wear comfortable, well-fitting shoes  Plan:  Telephone follow up appointment with care management team member scheduled for:  12-27-2023 at 1:45  pm              Please see education materials related to Diabetes  provided by MyChart link.  Patient verbalizes understanding of instructions and care plan provided today and agrees to view in MyChart. Active MyChart status and patient understanding of how to access instructions and care plan via MyChart confirmed with patient.     Telephone follow up  appointment with Managed Medicaid care management team member scheduled for:  Hendricks Her RN, BSN  [place] [facility] RN Case Manager   Direct [phone]   Following is a copy of your plan of care:  There are no care plans that you recently modified to display for this patient.

## 2023-11-30 NOTE — Progress Notes (Signed)
 Complex Care Management Note Care Guide Note  11/30/2023 Name: Hailey Greene MRN: [id number] DOB: 1951/02/05   Complex Care Management Outreach Attempts: An unsuccessful telephone outreach was attempted today to offer the patient information about available complex care management services.  Follow Up Plan:  Additional outreach attempts will be made to offer the patient complex care management information and services.   Encounter Outcome:  No Answer-Left voicemail  Leotis Rase [facility], [facility] Guide  Direct Dial: [phone]  Fax [phone]

## 2023-12-03 ENCOUNTER — Other Ambulatory Visit: Payer: Self-pay | Admitting: Family Medicine

## 2023-12-03 DIAGNOSIS — E1142 Type 2 diabetes mellitus with diabetic polyneuropathy: Secondary | ICD-10-CM

## 2023-12-04 ENCOUNTER — Ambulatory Visit (INDEPENDENT_AMBULATORY_CARE_PROVIDER_SITE_OTHER): Admitting: Family Medicine

## 2023-12-04 ENCOUNTER — Encounter: Payer: Self-pay | Admitting: Family Medicine

## 2023-12-04 VITALS — BP 110/64 | HR 82 | Temp 98.8°F | Resp 18 | Ht 63.0 in | Wt 169.0 lb

## 2023-12-04 DIAGNOSIS — R350 Frequency of micturition: Secondary | ICD-10-CM

## 2023-12-04 DIAGNOSIS — F331 Major depressive disorder, recurrent, moderate: Secondary | ICD-10-CM

## 2023-12-04 DIAGNOSIS — F419 Anxiety disorder, unspecified: Secondary | ICD-10-CM | POA: Insufficient documentation

## 2023-12-04 LAB — POCT URINALYSIS DIP (CLINITEK)
Bilirubin, UA: NEGATIVE
Blood, UA: NEGATIVE
Glucose, UA: NEGATIVE mg/dL
Ketones, POC UA: NEGATIVE mg/dL
Nitrite, UA: NEGATIVE
POC PROTEIN,UA: NEGATIVE
Spec Grav, UA: 1.015 (ref 1.010–1.025)
Urobilinogen, UA: 0.2 U/dL
pH, UA: 6 (ref 5.0–8.0)

## 2023-12-04 MED ORDER — BUSPIRONE HCL 15 MG PO TABS: 15.0000 mg | ORAL_TABLET | Freq: Every day | ORAL | 1 refills | Status: DC

## 2023-12-04 MED ORDER — HYDROXYZINE PAMOATE 25 MG PO CAPS: 25.0000 mg | ORAL_CAPSULE | Freq: Every evening | ORAL | 0 refills | Status: AC | PRN

## 2023-12-04 NOTE — Assessment & Plan Note (Signed)
 PHQ-9 - Mild Depression - Continue taking Trintellix  20 mg once daily    11/30/2023    2:21 PM 11/23/2023    5:52 PM 11/14/2023    4:08 PM  Depression screen PHQ 2/9  Decreased Interest 1 0 0  Down, Depressed, Hopeless 1 0 0  PHQ - 2 Score 2 0 0  Altered sleeping 1    Tired, decreased energy 2    Change in appetite 2    Feeling bad or failure about yourself  1    Trouble concentrating 0    Moving slowly or fidgety/restless 0    Suicidal thoughts 0    PHQ-9 Score 8    Difficult doing work/chores Somewhat difficult

## 2023-12-04 NOTE — Assessment & Plan Note (Signed)
 Anxiety disorder    10/09/2023   10:55 AM 07/11/2023    1:25 PM 03/08/2023    8:51 AM 11/16/2022    8:58 AM  GAD 7 : Generalized Anxiety Score  Nervous, Anxious, on Edge 0 0 0 3  Control/stop worrying 0 0 0 2  Worry too much - different things 0 0 0 2  Trouble relaxing 0 0 0 1  Restless 0 0 0 1  Easily annoyed or irritable 0 0 0 0  Afraid - awful might happen 0 0 0 0  Total GAD 7 Score 0 0 0 9  Anxiety Difficulty Not difficult at all  Not difficult at all Very difficult     Chronic anxiety with recent exacerbation. Current medication ineffective. - Refilled hydroxyzine . - Prescribed additional mild anxiolytic for daily use.

## 2023-12-04 NOTE — Progress Notes (Unsigned)
 Subjective:  Patient ID: Hailey Greene, female    DOB: March 10, 1951  Age: 73 y.o. MRN: [id number]  Chief Complaint  Patient presents with  . Hospitalization Follow-up    Discussed the use of AI scribe software for clinical note transcription with the patient, who gave verbal consent to proceed.  History of Present Illness   Hailey Greene is a 73 year old female who presents for a hospital follow-up after a recent fall and viral infection.  She was hospitalized at [facility] following a fall that occurred two weeks ago on a Tuesday. During her hospital stay, she underwent a hip x-ray, chest x-ray, CT of the abdomen, CT of the head, and an EKG. She was kept overnight due to a viral infection that was incidentally found during her evaluation.  She has a history of frequent hospitalizations, approximately nineteen times since November, for various issues including COVID-19 twice, norovirus, the regular flu twice, and a recent viral infection. She also had double pneumonia twice last winter. Concerned about her health, she has been wearing a mask to prevent further infections.  She is currently experiencing anxiety and is on hydroxyzine , which she feels is not effective. She requests a refill and an additional mild medication to help manage her anxiety.  She has received the flu and RSV vaccines and plans to get the COVID-19 vaccine by the end of the month. No other symptoms such as shortness of breath since her last hospital discharge.  She lives in [place] with her husband and son.         11/30/2023    2:21 PM 11/23/2023    5:52 PM 11/14/2023    4:08 PM 10/09/2023   10:54 AM 09/28/2023    4:07 PM  Depression screen PHQ 2/9  Decreased Interest 1 0 0 0 0  Down, Depressed, Hopeless 1 0 0 0 0  PHQ - 2 Score 2 0 0 0 0  Altered sleeping 1   3   Tired, decreased energy 2   0   Change in appetite 2   0   Feeling bad or failure about yourself  1   0   Trouble concentrating 0   0    Moving slowly or fidgety/restless 0   0   Suicidal thoughts 0   0   PHQ-9 Score 8   3   Difficult doing work/chores Somewhat difficult   Not difficult at all         11/30/2023    2:17 PM  Fall Risk   Falls in the past year? 1  Number falls in past yr: 1  Injury with Fall? 1  Comment hospitalized  x rays were negative ending up keeping patient for viral infection  Risk for fall due to : History of fall(s);Impaired balance/gait;Impaired mobility  Follow up Falls evaluation completed;Education provided;Falls prevention discussed    Patient Care Team: Sherre Clapper, MD as PCP - General (Family Medicine) Rubie Kemps, MD as Consulting Physician (Orthopedic Surgery) Corlis Joen BIRCH, RN as Oncology Nurse Navigator Ramonita Suzen CROME, RN as [facility] Care Management   Review of Systems  Constitutional:  Negative for chills, diaphoresis, fatigue and fever.  HENT:  Negative for congestion, ear pain and sinus pain.   Eyes: Negative.   Respiratory:  Negative for cough and shortness of breath.   Cardiovascular:  Negative for chest pain.  Gastrointestinal:  Negative for abdominal pain, constipation, diarrhea, nausea and vomiting.  Endocrine: Negative.   Genitourinary:  Negative for dysuria, frequency and urgency.  Musculoskeletal:  Negative for arthralgias.  Allergic/Immunologic: Negative.   Neurological:  Negative for dizziness, weakness, light-headedness and headaches.  Hematological: Negative.   Psychiatric/Behavioral:  Negative for dysphoric mood. The patient is not nervous/anxious.     Current Outpatient Medications on File Prior to Visit  Medication Sig Dispense Refill  . acetaminophen  (TYLENOL ) 500 MG tablet Take 500 mg by mouth.    . Albuterol -Budesonide  (AIRSUPRA ) 90-80 MCG/ACT AERO Inhale 2 puffs into the lungs 4 (four) times daily as needed. 10.7 g 3  . amitriptyline  (ELAVIL ) 25 MG tablet TAKE 1 TABLET BY MOUTH EVERY EVENING 30 tablet 5  . amoxicillin -clavulanate (AUGMENTIN ) 875-125  MG tablet Take 1 tablet by mouth 2 (two) times daily.    . anastrozole  (ARIMIDEX ) 1 MG tablet Take 1 tablet (1 mg total) by mouth daily. 30 tablet 5  . ASPIRIN  81 PO Take 81 mg by mouth daily.    . Blood Glucose Monitoring Suppl DEVI 1 each by Does not apply route daily. E11.9 May substitute to any manufacturer covered by patient's insurance. 1 each 0  . Blood Glucose Monitoring Suppl DEVI 1 each by Does not apply route in the morning, at noon, and at bedtime. May substitute to any manufacturer covered by patient's insurance. 1 each 0  . budeson-glycopyrrolate-formoterol  (BREZTRI  AEROSPHERE) 160-9-4.8 MCG/ACT AERO Inhale 2 puffs into the lungs in the morning and at bedtime.    . carbamide peroxide (DEBROX) 6.5 % OTIC solution Place 5 drops into the left ear 2 (two) times daily. 15 mL 0  . Cyanocobalamin  (VITAMIN B-12) 2500 MCG SUBL Place 1 tablet (2,500 mcg total) under the tongue daily. DISSOLVE 1 TABLET UNDER THE TONGUE DAILY 90 tablet 3  . dicyclomine  (BENTYL ) 20 MG tablet TAKE 1 TABLET (20 MG TOTAL) BY MOUTH 4 (FOUR) TIMES DAILY AS NEEDED (INTESTINAL SPASMS). 360 tablet 1  . Eszopiclone  3 MG TABS Take 1 tablet (3 mg total) by mouth at bedtime. 30 tablet 2  . ezetimibe  (ZETIA ) 10 MG tablet TAKE 1 TABLET BY MOUTH EVERY DAY 90 tablet 1  . fluticasone  (FLONASE ) 50 MCG/ACT nasal spray SPRAY 2 SPRAYS INTO EACH NOSTRIL EVERY DAY 48 g 2  . glucose blood (ONETOUCH VERIO) test strip E11.42 Use new test strip each time when checking FBS 100 each 2  . icosapent  Ethyl (VASCEPA ) 1 g capsule TAKE 2 CAPSULES BY MOUTH TWICE  DAILY 120 capsule 11  . ipratropium-albuterol  (DUONEB) 0.5-2.5 (3) MG/3ML SOLN Take 3 mLs by nebulization every 6 (six) hours as needed (SOB. WHEEZING). 360 mL 1  . Lancets (ONETOUCH DELICA PLUS LANCET33G) MISC E 11.42 Use new lancet each time when checking FBS 100 each 2  . loratadine  (CLARITIN ) 10 MG tablet Take 1 tablet (10 mg total) by mouth daily. 90 tablet 3  . mirtazapine  (REMERON ) 15  MG tablet Take 15 mg by mouth at bedtime.    . Multiple Vitamin (MULTIVITAMIN WITH MINERALS) TABS tablet Take 1 tablet by mouth daily.    . omeprazole  (PRILOSEC) 20 MG capsule TAKE 1 CAPSULE BY MOUTH EVERY DAY 90 capsule 1  . ondansetron  (ZOFRAN -ODT) 4 MG disintegrating tablet Take 4 mg by mouth every 6 (six) hours as needed for nausea or vomiting.    . phentermine  (ADIPEX-P ) 37.5 MG tablet Take 1 tablet (37.5 mg total) by mouth every morning. 30 tablet 0  . pregabalin  (LYRICA ) 300 MG capsule Take 1 capsule (300 mg total) by mouth 2 (two) times daily. 60 capsule  2  . ramipril  (ALTACE ) 2.5 MG capsule TAKE 1 CAPSULE BY MOUTH EVERY DAY 90 capsule 1  . rOPINIRole  (REQUIP ) 3 MG tablet Take 1 tablet (3 mg total) by mouth daily. 90 tablet 0  . rosuvastatin  (CRESTOR ) 40 MG tablet Take 1 tablet (40 mg total) by mouth daily. 90 tablet 0  . Semaglutide , 2 MG/DOSE, (OZEMPIC , 2 MG/DOSE,) 8 MG/3ML SOPN INJECT 2 MG AS DIRECTED ONCE A WEEK. 3 mL 2  . solifenacin  (VESICARE ) 5 MG tablet Take 1 tablet (5 mg total) by mouth daily. 90 tablet 0  . SYMBICORT  160-4.5 MCG/ACT inhaler INHALE 2 PUFFS INTO THE LUNGS TWICE A DAY 10.2 each 3  . topiramate  (TOPAMAX ) 50 MG tablet Take 1 tablet (50 mg total) by mouth daily. 90 tablet 0  . vortioxetine  HBr (TRINTELLIX ) 20 MG TABS tablet Take 1 tablet (20 mg total) by mouth daily. 90 tablet 0   No current facility-administered medications on file prior to visit.   Past Medical History:  Diagnosis Date  . 2019 novel coronavirus disease (COVID-19) 12/31/2018  . Acute hypoxemic respiratory failure due to COVID-19 (HCC)   . Asthma   . Bronchiectasis (HCC)   . Chicken pox   . Diabetes (HCC)   . GERD (gastroesophageal reflux disease)   . Hypertension   . IBS (irritable bowel syndrome)   . Major depressive disorder   . Mixed hyperlipidemia   . Neuromuscular disorder (HCC)    peripheral neuropathy  . Neuropathy   . Osteoarthritis   . Pneumonia    x2  . Primary insomnia    . Primary osteoarthritis of left knee 05/31/2019  . RLS (restless legs syndrome)   . S/P total knee replacement 07/15/2019  . Sleep apnea    does not use CPAP  . Status post total knee replacement 07/25/2019  . Tachycardia   . Urge incontinence   . UTI (lower urinary tract infection)   . Weakness    Past Surgical History:  Procedure Laterality Date  . ABDOMINAL HYSTERECTOMY    . APPENDECTOMY    . BREAST BIOPSY Right 04/26/2023   US  RT BREAST BX W LOC DEV 1ST LESION IMG BX SPEC US  GUIDE 04/26/2023 [facility]  . BREAST BIOPSY  09/12/2023   US  RT RADIOACTIVE SEED LOC 09/12/2023 [facility]  . BREAST LUMPECTOMY    . CHOLECYSTECTOMY    . EXCISION OF BREAST BIOPSY Right 09/13/2023   Procedure: EXCISION OF BREAST BIOPSY WITH RADIOACTIVE SEED GUIDED LOCALIZATION;  Surgeon: Belinda Cough, MD;  Location: [facility] OR;  Service: General;  Laterality: Right;  Right breast rsl excisional biopsy  . HERNIA REPAIR    . REPLACEMENT TOTAL KNEE Right   . TONSILLECTOMY    . TOTAL KNEE ARTHROPLASTY Left 07/15/2019   Procedure: TOTAL KNEE ARTHROPLASTY;  Surgeon: Rubie Kemps, MD;  Location: [facility];  Service: Orthopedics;  Laterality: Left;  . TUBAL LIGATION      Family History  Problem Relation Age of Onset  . Thyroid  disease Mother   . Cancer Mother   . Migraines Mother   . Brain cancer Father   . Throat cancer Father   . Cancer - Other Father   . Heart disease Maternal Grandmother   . Diabetes Daughter    Social History   Socioeconomic History  . Marital status: Married    Spouse name: Alm  . Number of children: 3  . Years of education: 72  . Highest education level: Not on file  Occupational History  .  Occupation: Housewife  Tobacco Use  . Smoking status: Former    Current packs/day: 0.00    Average packs/day: 2.0 packs/day for 12.0 years (24.0 ttl pk-yrs)    Types: Cigarettes    Start date: 05/25/1998    Quit date: 05/25/2010    Years since quitting: 13.5  .  Smokeless tobacco: Never  . Tobacco comments:    Used to smoke 1-2 packs a days  Vaping Use  . Vaping status: Never Used  Substance and Sexual Activity  . Alcohol use: Not Currently    Comment: past occasionally  . Drug use: Never  . Sexual activity: Not Currently    Birth control/protection: Surgical    Comment: hyst  Other Topics Concern  . Not on file  Social History Narrative   Born and raised in [place], [place]. Currently lives in a house with her husband. 1 dog. Fun: Garden, feed birds, swimming   Denies any religious beliefs effecting health care.    Social Drivers of Health   Financial Resource Strain: Medium Risk (10/02/2023)   Overall Financial Resource Strain (CARDIA)   . Difficulty of Paying Living Expenses: Somewhat hard  Food Insecurity: No Food Insecurity (11/30/2023)   Hunger Vital Sign   . Worried About Programme researcher, broadcasting/film/video in the Last Year: Never true   . Ran Out of Food in the Last Year: Never true  Transportation Needs: No Transportation Needs (11/30/2023)   PRAPARE - Transportation   . Lack of Transportation (Medical): No   . Lack of Transportation (Non-Medical): No  Physical Activity: Insufficiently Active (08/19/2023)   Exercise Vital Sign   . Days of Exercise per Week: 4 days   . Minutes of Exercise per Session: 20 min  Stress: No Stress Concern Present (09/28/2023)   [name] of Occupational Health - Occupational Stress Questionnaire   . Feeling of Stress: Not at all  Recent Concern: Stress - Stress Concern Present (08/19/2023)   [name] of Occupational Health - Occupational Stress Questionnaire   . Feeling of Stress : Rather much  Social Connections: Socially Integrated (11/23/2023)   Social Connection and Isolation Panel   . Frequency of Communication with Friends and Family: More than three times a week   . Frequency of Social Gatherings with Friends and Family: Three times a week   . Attends Religious Services: More than 4 times per year    . Active Member of Clubs or Organizations: Yes   . Attends Banker Meetings: More than 4 times per year   . Marital Status: Married    Objective:  BP 110/64   Pulse 82   Temp 98.8 F (37.1 C) (Temporal)   Resp 18   Ht 5' 3 (1.6 m)   Wt 169 lb (76.7 kg)   SpO2 98%   BMI 29.94 kg/m      12/04/2023    9:28 AM 11/30/2023    2:12 PM 11/16/2023   11:27 AM  BP/Weight  Systolic BP 110  871  Diastolic BP 64  78  Wt. (Lbs) 169 160 174  BMI 29.94 kg/m2 28.34 kg/m2 30.82 kg/m2    Physical Exam Vitals reviewed.  Constitutional:      General: She is not in acute distress.    Appearance: Normal appearance. She is not ill-appearing.  Eyes:     Conjunctiva/sclera: Conjunctivae normal.  Cardiovascular:     Rate and Rhythm: Normal rate and regular rhythm.     Heart sounds: Normal heart sounds. No  murmur heard. Pulmonary:     Effort: Pulmonary effort is normal.     Breath sounds: Normal breath sounds. No wheezing.  Abdominal:     Palpations: Abdomen is soft.  Musculoskeletal:        General: Normal range of motion.  Neurological:     Mental Status: She is alert. Mental status is at baseline.  Psychiatric:        Mood and Affect: Mood normal.        Behavior: Behavior normal.     Lab Results  Component Value Date   WBC 8.4 09/28/2023   HGB 13.4 09/28/2023   HCT 41.3 09/28/2023   PLT 226 09/28/2023   GLUCOSE 109 (H) 09/28/2023   CHOL 241 (H) 08/23/2023   TRIG 122 08/23/2023   HDL 61 08/23/2023   LDLCALC 158 (H) 08/23/2023   ALT 8 09/28/2023   AST 14 (L) 09/28/2023   NA 137 09/28/2023   K 3.9 09/28/2023   CL 102 09/28/2023   CREATININE 1.03 (H) 09/28/2023   BUN 23 09/28/2023   CO2 22 09/28/2023   TSH 0.966 08/23/2023   HGBA1C 5.6 08/23/2023      Assessment & Plan:  Anxiety Assessment & Plan: Anxiety disorder    10/09/2023   10:55 AM 07/11/2023    1:25 PM 03/08/2023    8:51 AM 11/16/2022    8:58 AM  GAD 7 : Generalized Anxiety Score  Nervous,  Anxious, on Edge 0 0 0 3  Control/stop worrying 0 0 0 2  Worry too much - different things 0 0 0 2  Trouble relaxing 0 0 0 1  Restless 0 0 0 1  Easily annoyed or irritable 0 0 0 0  Afraid - awful might happen 0 0 0 0  Total GAD 7 Score 0 0 0 9  Anxiety Difficulty Not difficult at all  Not difficult at all Very difficult     Chronic anxiety with recent exacerbation. Current medication ineffective. - Refilled hydroxyzine . - Prescribed additional mild anxiolytic for daily use.  Orders: -     hydrOXYzine  Pamoate; Take 1 capsule (25 mg total) by mouth at bedtime as needed.  Dispense: 90 capsule; Refill: 0 -     busPIRone  HCl; Take 1 tablet (15 mg total) by mouth daily.  Dispense: 30 tablet; Refill: 1  Frequent urination -     POCT URINALYSIS DIP (CLINITEK)  Moderate recurrent major depression (HCC) Assessment & Plan: PHQ-9 - Mild Depression - Continue taking Trintellix  20 mg once daily    11/30/2023    2:21 PM 11/23/2023    5:52 PM 11/14/2023    4:08 PM  Depression screen PHQ 2/9  Decreased Interest 1 0 0  Down, Depressed, Hopeless 1 0 0  PHQ - 2 Score 2 0 0  Altered sleeping 1    Tired, decreased energy 2    Change in appetite 2    Feeling bad or failure about yourself  1    Trouble concentrating 0    Moving slowly or fidgety/restless 0    Suicidal thoughts 0    PHQ-9 Score 8    Difficult doing work/chores Somewhat difficult         Meds ordered this encounter  Medications  . hydrOXYzine  (VISTARIL ) 25 MG capsule    Sig: Take 1 capsule (25 mg total) by mouth at bedtime as needed.    Dispense:  90 capsule    Refill:  0  . busPIRone  (BUSPAR ) 15 MG tablet  Sig: Take 1 tablet (15 mg total) by mouth daily.    Dispense:  30 tablet    Refill:  1    Orders Placed This Encounter  Procedures  . POCT URINALYSIS DIP (CLINITEK)     Follow-up: Return if symptoms worsen or fail to improve.   I,Angela Taylor,acting as a Neurosurgeon for Harrie CHRISTELLA Cedar, FNP.,have documented all  relevant documentation on the behalf of Harrie CHRISTELLA Cedar, FNP,as directed by  Harrie CHRISTELLA Cedar, FNP while in the presence of Harrie CHRISTELLA Cedar, FNP.   An After Visit Summary was printed and given to the patient.  Harrie CHRISTELLA Cedar, FNP [facility] [phone]

## 2023-12-04 NOTE — Patient Instructions (Signed)
  VISIT SUMMARY: You came in today for a follow-up after your recent hospital stay due to a fall and a viral infection. During your hospital visit, you had several tests done, including x-rays, CT scans, and an EKG. You were kept overnight because of a viral infection that was found during your evaluation. You have a history of frequent hospitalizations for various infections and have been taking precautions like wearing a mask to prevent further illness. You also mentioned that you are experiencing anxiety and that your current medication is not effective.  YOUR PLAN: -ANXIETY DISORDER: Anxiety disorder is a condition characterized by excessive worry or fear. Your current medication, hydroxyzine , has been refilled, and an additional mild medication has been prescribed to help manage your anxiety on a daily basis.  -GENERAL HEALTH MAINTENANCE: You have received the flu and RSV vaccines and plan to get the COVID-19 vaccine by the end of the month. These vaccinations are important to help protect you from serious infections.  INSTRUCTIONS: Please follow up as needed if your anxiety does not improve with the new medication or if you experience any new symptoms. Make sure to get your COVID-19 vaccine by the end of the month as planned.                      Contains text generated by Abridge.                                 Contains text generated by Abridge.

## 2023-12-26 ENCOUNTER — Other Ambulatory Visit: Payer: Self-pay | Admitting: *Deleted

## 2023-12-26 ENCOUNTER — Encounter: Payer: Self-pay | Admitting: *Deleted

## 2023-12-26 NOTE — Patient Outreach (Signed)
 Complex Care Management   Visit Note  12/26/2023  Name:  Hailey Greene MRN: [id number] DOB: [birth year]  Situation: Referral received for Complex Care Management related to {Criteria:32550} I obtained verbal consent from {CHL AMB Patient/Caregiver:28184}.  Visit completed with {CHL AMB Patient/Caregiver:28184}  on the phone   Has not received the pantry list Agreed for RN CM to review and refer her to resources listed in Find Help:  She was made aware of further outreach with Hendricks Her on 12/27/23 and later  New pantries  Background:   Past Medical History:  Diagnosis Date   2019 novel coronavirus disease (COVID-19) 12/31/2018   Acute hypoxemic respiratory failure due to COVID-19 (HCC)    Asthma    Bronchiectasis (HCC)    Chicken pox    Diabetes (HCC)    GERD (gastroesophageal reflux disease)    Hypertension    IBS (irritable bowel syndrome)    Major depressive disorder    Mixed hyperlipidemia    Neuromuscular disorder (HCC)    peripheral neuropathy   Neuropathy    Osteoarthritis    Pneumonia    x2   Primary insomnia    Primary osteoarthritis of left knee 05/31/2019   RLS (restless legs syndrome)    S/P total knee replacement 07/15/2019   Sleep apnea    does not use CPAP   Status post total knee replacement 07/25/2019   Tachycardia    Urge incontinence    UTI (lower urinary tract infection)    Weakness     Assessment: Patient Reported Symptoms:  Cognitive        Neurological      HEENT        Cardiovascular      Respiratory      Endocrine      Gastrointestinal        Genitourinary      Integumentary      Musculoskeletal          Psychosocial            12/26/2023    PHQ2-9 Depression Screening   Little interest or pleasure in doing things    Feeling down, depressed, or hopeless    PHQ-2 - Total Score    Trouble falling or staying asleep, or sleeping too much    Feeling tired or having little energy    Poor appetite or  overeating     Feeling bad about yourself - or that you are a failure or have let yourself or your family down    Trouble concentrating on things, such as reading the newspaper or watching television    Moving or speaking so slowly that other people could have noticed.  Or the opposite - being so fidgety or restless that you have been moving around a lot more than usual    Thoughts that you would be better off dead, or hurting yourself in some way    PHQ2-9 Total Score    If you checked off any problems, how difficult have these problems made it for you to do your work, take care of things at home, or get along with other people    Depression Interventions/Treatment      There were no vitals filed for this visit.  Medications Reviewed Today   Medications were not reviewed in this encounter     Recommendation:   PCP Follow-up Continue Current Plan of Care Follow up with local pantries   Follow Up Plan:   Telephone follow up appointment  date/time:  12/27/23 with Macario Kay Suzen LITTIE. Ramonita, RN, BSN, CCM [place]  [facility], [facility] RN Care Manager Direct Dial: [phone]  Fax: [phone]

## 2023-12-26 NOTE — Patient Instructions (Signed)
 Rollo DELENA Public - I am sorry I was unable to reach you today for our scheduled appointment. I work with [facility], Abigail, MD and am calling to support your healthcare needs. Please contact me at [phone] at your earliest convenience. I look forward to speaking with you soon.   Thank you,   Suzen L. Ramonita, RN, BSN, CCM [place]  [facility], [facility] RN Care Manager

## 2023-12-27 ENCOUNTER — Other Ambulatory Visit: Payer: Self-pay

## 2023-12-27 NOTE — Patient Instructions (Signed)
 Visit Information  Ms. Bertz was given information about Medicaid Managed Care team care coordination services as a part of their [facility] Community Plan Medicaid benefit.   If you would like to schedule transportation through your [facility], please call the following number at least 2 days in advance of your appointment: [phone]   Rides for urgent appointments can also be made after hours by calling Member Services.  Call the Behavioral Health Crisis Line at [phone], at any time, 24 hours a day, 7 days a week. If you are in danger or need immediate medical attention call 911.  Please see education materials related to food safety eating plan & myplate form provided by MyChart link.  AND LIST OF [place] PANTRIES - PRINTED AND MAILED  Patient verbalizes understanding of instructions and care plan provided today and agrees to view in MyChart. Active MyChart status and patient understanding of how to access instructions and care plan via MyChart confirmed with patient.     Hendricks Her, RN Care Manager will outreach to patient on 12/27/23   Suzen L. Ramonita, RN, BSN, CCM [place]  [facility], [facility] RN Care Manager Direct Dial: [phone]  Fax: [phone]   Following is a copy of your plan of care:  There are no care plans that you recently modified to display for this patient.

## 2023-12-27 NOTE — Patient Instructions (Signed)
 Visit Information  Thank you for taking time to visit with me today. Please don't hesitate to contact me if I can be of assistance to you before our next scheduled appointment.  Our next appointment is by telephone on 01-24-2024 11:00 AM  at  Please call the care guide team at [phone] if you need to cancel or reschedule your appointment.   Following is a copy of your care plan:   Goals Addressed             This Visit's Progress    VBCI RN Care Plan       Problems:  Chronic Disease Management support and education needs related to DMII  Goal: Over the next 90 days the Patient will attend all scheduled medical appointments: with providers as evidenced by encounter notes in Epic for patient appointment         continue to work with RN Care Manager and/or Social Worker to address care management and care coordination needs related to DMII as evidenced by adherence to care management team scheduled appointments     not experience hospital admission as evidenced by review of electronic medical record. Hospital Admissions in last 6 months = 1 take all medications exactly as prescribed and will call provider for medication related questions as evidenced by communication with Provider for medication questions and verbalizing compliance    verbalize understanding of plan for management of Diabetes II  as evidenced by confirming daily FSBS, medication adherence and diet modifications   Interventions:   Diabetes Interventions: Assessed patient's understanding of A1c goal: <6.5% Provided education to patient about basic DM disease process Reviewed medications with patient and discussed importance of medication adherence Discussed plans with patient for ongoing care management follow up and provided patient with direct contact information for care management team Provided patient with written educational materials related to hypo and hyperglycemia and importance of correct  treatment Reviewed scheduled/upcoming provider appointments including: 01-16-2024 2:20 PM appointment with Dr Sherre  Screening for signs and symptoms of depression related to chronic disease state  Assessed social determinant of health barriers Provided  SDOH food insecurity resources     Patient Self-Care Activities:  Attend all scheduled provider appointments Call pharmacy for medication refills 3-7 days in advance of running out of medications Call provider office for new concerns or questions  Notify RN Care Manager of TOC call rescheduling needs Perform all self care activities independently  Take medications as prescribed   schedule appointment with eye doctor check blood sugar at prescribed times: once daily  Update 10-1 had not checked FSBS today  check feet daily for cuts, sores or redness enter blood sugar readings and medication or insulin  into daily log take the blood sugar log to all doctor visits set goal weight drink 6 to 8 glasses of water  each day eat fish at least once per week fill half of plate with vegetables limit fast food meals to no more than 1 per week set a realistic goal wash and dry feet carefully every day wear comfortable, cotton socks wear comfortable, well-fitting shoes  Plan:  Telephone follow up appointment with care management team member scheduled for:  01-24-2024 at 11:00 AM               Please call the Suicide and Crisis Lifeline: 988 call the [place]  National Suicide Prevention Lifeline: 5625043429 or TTY: 651 794 8305 TTY 234-255-4211) to talk to a trained counselor call 1-800-273-TALK (toll free, 24 hour hotline) call 911 if you  are experiencing a Mental Health or Behavioral Health Crisis or need someone to talk to.  Patient verbalizes understanding of instructions and care plan provided today and agrees to view in MyChart. Active MyChart status and patient understanding of how to access instructions and care plan via MyChart  confirmed with patient.    Hendricks Her RN, BSN  [place] [facility] RN Case Information systems manager [phone]

## 2023-12-29 ENCOUNTER — Other Ambulatory Visit: Payer: Self-pay | Admitting: Family Medicine

## 2023-12-29 DIAGNOSIS — F419 Anxiety disorder, unspecified: Secondary | ICD-10-CM

## 2024-01-01 ENCOUNTER — Ambulatory Visit (INDEPENDENT_AMBULATORY_CARE_PROVIDER_SITE_OTHER): Admitting: Family Medicine

## 2024-01-01 ENCOUNTER — Encounter: Payer: Self-pay | Admitting: Family Medicine

## 2024-01-01 VITALS — BP 122/68 | HR 102 | Temp 97.9°F | Ht 63.0 in | Wt 166.0 lb

## 2024-01-01 DIAGNOSIS — J4550 Severe persistent asthma, uncomplicated: Secondary | ICD-10-CM

## 2024-01-01 DIAGNOSIS — R051 Acute cough: Secondary | ICD-10-CM | POA: Diagnosis not present

## 2024-01-01 DIAGNOSIS — J301 Allergic rhinitis due to pollen: Secondary | ICD-10-CM | POA: Diagnosis not present

## 2024-01-01 LAB — POCT INFLUENZA A/B
Influenza A, POC: NEGATIVE
Influenza B, POC: NEGATIVE

## 2024-01-01 LAB — POC COVID19 BINAXNOW: SARS Coronavirus 2 Ag: NEGATIVE

## 2024-01-01 NOTE — Patient Instructions (Addendum)
  VISIT SUMMARY: During your visit, we discussed your fatigue and upper respiratory symptoms, including nasal congestion, sore throat, and shortness of breath. We also reviewed your current medications and made adjustments to your inhaler regimen.  YOUR PLAN: ACUTE UPPER RESPIRATORY INFECTION: You have symptoms of an upper respiratory infection, which may also be related to allergies. Tests for influenza and COVID-19 were negative. -I believe this is more allergies . -stop allegra. Start zyrtec  -Recommend vaseline for sore nose. -Nasal saline for nasal dryness  CHRONIC OBSTRUCTIVE PULMONARY DISEASE (COPD)/ASTHMA: CONTINUE BREZTRI  2 PUFFS TWICE A DAY  CONTINUE AIR SUPRA 2 PUFFS FOUR TIMES A DAY AS NEEDED.                      Contains text generated by Abridge.                                 Contains text generated by Abridge.

## 2024-01-01 NOTE — Progress Notes (Unsigned)
 Acute Office Visit  Subjective:    Patient ID: Hailey Greene, female    DOB: March 28, 1951, 73 y.o.   MRN: [id number]  Chief Complaint  Patient presents with   Fatigued/cough   Discussed the use of AI scribe software for clinical note transcription with the patient, who gave verbal consent to proceed.  History of Present Illness Hailey Greene is a 73 year old female who presents with fatigue and upper respiratory symptoms.  Upper respiratory symptoms - Fatigue and upper respiratory symptoms for the past three days - Alternating nasal congestion and rhinorrhea with a raw sensation - Sore throat - No diarrhea, joint pain, back pain, muscle pain, rashes, or urinary symptoms such as burning or hematuria - No bladder issues  Allergic symptoms and antihistamine use - Taking Allegra once daily for allergies - Allegra not effective for current symptoms  Dyspnea and inhaler use - Shortness of breath, especially with movement - Uses breathing treatments twice daily, increasing to three times if significantly short of breath - Has run out of airsupra  - Uses breztri  inhaler 2 puffs twice daily and airsupra  mid day.   Fatigue and vitamin supplementation - Taking a one-a-day women's vitamin - Feels vitamin is not providing enough energy     Past Medical History:  Diagnosis Date   2019 novel coronavirus disease (COVID-19) 12/31/2018   Acute hypoxemic respiratory failure due to COVID-19 (HCC)    Asthma    Bronchiectasis (HCC)    Chicken pox    Diabetes (HCC)    GERD (gastroesophageal reflux disease)    Hypertension    IBS (irritable bowel syndrome)    Major depressive disorder    Mixed hyperlipidemia    Neuromuscular disorder (HCC)    peripheral neuropathy   Neuropathy    Osteoarthritis    Pneumonia    x2   Primary insomnia    Primary osteoarthritis of left knee 05/31/2019   RLS (restless legs syndrome)    S/P total knee replacement 07/15/2019   Sleep apnea     does not use CPAP   Status post total knee replacement 07/25/2019   Tachycardia    Urge incontinence    UTI (lower urinary tract infection)    Weakness     Past Surgical History:  Procedure Laterality Date   ABDOMINAL HYSTERECTOMY     APPENDECTOMY     BREAST BIOPSY Right 04/26/2023   US  RT BREAST BX W LOC DEV 1ST LESION IMG BX SPEC US  GUIDE 04/26/2023 [facility]   BREAST BIOPSY  09/12/2023   US  RT RADIOACTIVE SEED LOC 09/12/2023 [facility]   BREAST LUMPECTOMY     CHOLECYSTECTOMY     EXCISION OF BREAST BIOPSY Right 09/13/2023   Procedure: EXCISION OF BREAST BIOPSY WITH RADIOACTIVE SEED GUIDED LOCALIZATION;  Surgeon: Belinda Cough, MD;  Location: [facility];  Service: General;  Laterality: Right;  Right breast rsl excisional biopsy   HERNIA REPAIR     REPLACEMENT TOTAL KNEE Right    TONSILLECTOMY     TOTAL KNEE ARTHROPLASTY Left 07/15/2019   Procedure: TOTAL KNEE ARTHROPLASTY;  Surgeon: Rubie Kemps, MD;  Location: [facility];  Service: Orthopedics;  Laterality: Left;   TUBAL LIGATION      Family History  Problem Relation Age of Onset   Thyroid  disease Mother    Cancer Mother    Migraines Mother    Brain cancer Father    Throat cancer Father    Cancer - Other Father    Heart  disease Maternal Grandmother    Diabetes Daughter     Social History   Socioeconomic History   Marital status: Married    Spouse name: Alm   Number of children: 3   Years of education: 12   Highest education level: Not on file  Occupational History   Occupation: Housewife  Tobacco Use   Smoking status: Former    Current packs/day: 0.00    Average packs/day: 2.0 packs/day for 12.0 years (24.0 ttl pk-yrs)    Types: Cigarettes    Start date: 05/25/1998    Quit date: 05/25/2010    Years since quitting: 13.6   Smokeless tobacco: Never   Tobacco comments:    Used to smoke 1-2 packs a days  Vaping Use   Vaping status: Never Used  Substance and Sexual Activity   Alcohol use: Not  Currently    Comment: past occasionally   Drug use: Never   Sexual activity: Not Currently    Birth control/protection: Surgical    Comment: hyst  Other Topics Concern   Not on file  Social History Narrative   Born and raised in [place], [place]. Currently lives in a house with her husband. 1 dog. Fun: Garden, feed birds, swimming   Denies any religious beliefs effecting health care.    Social Drivers of Health   Financial Resource Strain: Medium Risk (10/02/2023)   Overall Financial Resource Strain (CARDIA)    Difficulty of Paying Living Expenses: Somewhat hard  Food Insecurity: Food Insecurity Present (12/27/2023)   Hunger Vital Sign    Worried About Running Out of Food in the Last Year: Sometimes true    Ran Out of Food in the Last Year: Sometimes true  Transportation Needs: No Transportation Needs (12/27/2023)   PRAPARE - Administrator, Civil Service (Medical): No    Lack of Transportation (Non-Medical): No  Physical Activity: Insufficiently Active (08/19/2023)   Exercise Vital Sign    Days of Exercise per Week: 4 days    Minutes of Exercise per Session: 20 min  Stress: No Stress Concern Present (09/28/2023)   [name] of Occupational Health - Occupational Stress Questionnaire    Feeling of Stress: Not at all  Recent Concern: Stress - Stress Concern Present (08/19/2023)   [name] of Occupational Health - Occupational Stress Questionnaire    Feeling of Stress : Rather much  Social Connections: Socially Integrated (11/23/2023)   Social Connection and Isolation Panel    Frequency of Communication with Friends and Family: More than three times a week    Frequency of Social Gatherings with Friends and Family: Three times a week    Attends Religious Services: More than 4 times per year    Active Member of Clubs or Organizations: Yes    Attends Banker Meetings: More than 4 times per year    Marital Status: Married  Catering manager Violence: Not  At Risk (12/27/2023)   Humiliation, Afraid, Rape, and Kick questionnaire    Fear of Current or Ex-Partner: No    Emotionally Abused: No    Physically Abused: No    Sexually Abused: No    Outpatient Medications Prior to Visit  Medication Sig Dispense Refill   acetaminophen  (TYLENOL ) 500 MG tablet Take 500 mg by mouth.     Albuterol -Budesonide  (AIRSUPRA ) 90-80 MCG/ACT AERO Inhale 2 puffs into the lungs 4 (four) times daily as needed. 10.7 g 3   amitriptyline  (ELAVIL ) 25 MG tablet TAKE 1 TABLET BY MOUTH EVERY EVENING  30 tablet 5   amoxicillin -clavulanate (AUGMENTIN ) 875-125 MG tablet Take 1 tablet by mouth 2 (two) times daily.     anastrozole  (ARIMIDEX ) 1 MG tablet Take 1 tablet (1 mg total) by mouth daily. 30 tablet 5   ASPIRIN  81 PO Take 81 mg by mouth daily.     Blood Glucose Monitoring Suppl DEVI 1 each by Does not apply route daily. E11.9 May substitute to any manufacturer covered by patient's insurance. 1 each 0   Blood Glucose Monitoring Suppl DEVI 1 each by Does not apply route in the morning, at noon, and at bedtime. May substitute to any manufacturer covered by patient's insurance. 1 each 0   budeson-glycopyrrolate-formoterol  (BREZTRI  AEROSPHERE) 160-9-4.8 MCG/ACT AERO Inhale 2 puffs into the lungs in the morning and at bedtime.     busPIRone  (BUSPAR ) 15 MG tablet TAKE 1 TABLET (15 MG TOTAL) BY MOUTH DAILY. 90 tablet 1   carbamide peroxide (DEBROX) 6.5 % OTIC solution Place 5 drops into the left ear 2 (two) times daily. 15 mL 0   Cyanocobalamin  (VITAMIN B-12) 2500 MCG SUBL Place 1 tablet (2,500 mcg total) under the tongue daily. DISSOLVE 1 TABLET UNDER THE TONGUE DAILY 90 tablet 3   dicyclomine  (BENTYL ) 20 MG tablet TAKE 1 TABLET (20 MG TOTAL) BY MOUTH 4 (FOUR) TIMES DAILY AS NEEDED (INTESTINAL SPASMS). 360 tablet 1   Eszopiclone  3 MG TABS Take 1 tablet (3 mg total) by mouth at bedtime. 30 tablet 2   ezetimibe  (ZETIA ) 10 MG tablet TAKE 1 TABLET BY MOUTH EVERY DAY 90 tablet 1    fluticasone  (FLONASE ) 50 MCG/ACT nasal spray SPRAY 2 SPRAYS INTO EACH NOSTRIL EVERY DAY 48 g 2   glucose blood (ONETOUCH VERIO) test strip E11.42 Use new test strip each time when checking FBS 100 each 2   hydrOXYzine  (VISTARIL ) 25 MG capsule Take 1 capsule (25 mg total) by mouth at bedtime as needed. 90 capsule 0   icosapent  Ethyl (VASCEPA ) 1 g capsule TAKE 2 CAPSULES BY MOUTH TWICE  DAILY 120 capsule 11   ipratropium-albuterol  (DUONEB) 0.5-2.5 (3) MG/3ML SOLN Take 3 mLs by nebulization every 6 (six) hours as needed (SOB. WHEEZING). 360 mL 1   Lancets (ONETOUCH DELICA PLUS LANCET33G) MISC E 11.42 Use new lancet each time when checking FBS 100 each 2   loratadine  (CLARITIN ) 10 MG tablet Take 1 tablet (10 mg total) by mouth daily. 90 tablet 3   mirtazapine  (REMERON ) 15 MG tablet Take 15 mg by mouth at bedtime.     Multiple Vitamin (MULTIVITAMIN WITH MINERALS) TABS tablet Take 1 tablet by mouth daily.     omeprazole  (PRILOSEC) 20 MG capsule TAKE 1 CAPSULE BY MOUTH EVERY DAY 90 capsule 1   ondansetron  (ZOFRAN -ODT) 4 MG disintegrating tablet Take 4 mg by mouth every 6 (six) hours as needed for nausea or vomiting.     phentermine  (ADIPEX-P ) 37.5 MG tablet Take 1 tablet (37.5 mg total) by mouth every morning. 30 tablet 0   pregabalin  (LYRICA ) 300 MG capsule Take 1 capsule (300 mg total) by mouth 2 (two) times daily. 60 capsule 2   ramipril  (ALTACE ) 2.5 MG capsule TAKE 1 CAPSULE BY MOUTH EVERY DAY 90 capsule 1   rOPINIRole  (REQUIP ) 3 MG tablet Take 1 tablet (3 mg total) by mouth daily. 90 tablet 0   rosuvastatin  (CRESTOR ) 40 MG tablet Take 1 tablet (40 mg total) by mouth daily. 90 tablet 0   Semaglutide , 2 MG/DOSE, (OZEMPIC , 2 MG/DOSE,) 8 MG/3ML SOPN INJECT 2  MG AS DIRECTED ONCE A WEEK. 3 mL 2   solifenacin  (VESICARE ) 5 MG tablet Take 1 tablet (5 mg total) by mouth daily. 90 tablet 0   SYMBICORT  160-4.5 MCG/ACT inhaler INHALE 2 PUFFS INTO THE LUNGS TWICE A DAY 10.2 each 3   topiramate  (TOPAMAX ) 50 MG  tablet Take 1 tablet (50 mg total) by mouth daily. 90 tablet 0   vortioxetine  HBr (TRINTELLIX ) 20 MG TABS tablet Take 1 tablet (20 mg total) by mouth daily. 90 tablet 0   No facility-administered medications prior to visit.    Not on File  Review of Systems  Constitutional:  Positive for fatigue. Negative for appetite change and fever.  HENT:  Positive for congestion, rhinorrhea and sore throat. Negative for ear pain and sinus pressure.   Respiratory:  Positive for cough, shortness of breath and wheezing. Negative for chest tightness.   Cardiovascular:  Negative for chest pain and palpitations.  Gastrointestinal:  Negative for abdominal pain, constipation, diarrhea, nausea and vomiting.  Genitourinary:  Negative for dysuria, frequency and hematuria.  Musculoskeletal:  Negative for arthralgias, back pain, joint swelling and myalgias.  Skin:  Negative for rash.  Neurological:  Positive for weakness. Negative for dizziness and headaches.  Psychiatric/Behavioral:  Negative for dysphoric mood. The patient is not nervous/anxious.        Objective:        01/01/2024    9:51 AM 12/27/2023    2:09 PM 12/04/2023    9:28 AM  Vitals with BMI  Height 5' 3  5' 3  Weight 166 lbs 160 lbs 169 lbs  BMI 29.41  29.94  Systolic 122  110  Diastolic 68  64  Pulse 102  82    Orthostatic VS for the past 72 hrs (Last 3 readings):  Patient Position BP Location  01/01/24 0951 Sitting Left Arm     Physical Exam Vitals reviewed.  Constitutional:      Appearance: Normal appearance. She is normal weight.  HENT:     Right Ear: Tympanic membrane normal.     Left Ear: Tympanic membrane normal.     Nose: Congestion and rhinorrhea present.     Comments: Sore scaling nose.     Mouth/Throat:     Pharynx: No posterior oropharyngeal erythema.  Neck:     Vascular: No carotid bruit.  Cardiovascular:     Rate and Rhythm: Normal rate and regular rhythm.     Heart sounds: Normal heart sounds.  Pulmonary:      Effort: Pulmonary effort is normal. No respiratory distress.     Breath sounds: Normal breath sounds.  Neurological:     Mental Status: She is alert and oriented to person, place, and time.  Psychiatric:        Mood and Affect: Mood normal.        Behavior: Behavior normal.     Health Maintenance Due  Topic Date Due   OPHTHALMOLOGY EXAM  08/06/2022   COVID-19 Vaccine (7 - 2025-26 season) 11/27/2023   Mammogram  02/01/2024    There are no preventive care reminders to display for this patient.   Lab Results  Component Value Date   TSH 0.966 08/23/2023   Lab Results  Component Value Date   WBC 8.4 09/28/2023   HGB 13.4 09/28/2023   HCT 41.3 09/28/2023   MCV 91.4 09/28/2023   PLT 226 09/28/2023   Lab Results  Component Value Date   NA 137 09/28/2023   K 3.9 09/28/2023  CO2 22 09/28/2023   GLUCOSE 109 (H) 09/28/2023   BUN 23 09/28/2023   CREATININE 1.03 (H) 09/28/2023   BILITOT 0.3 09/28/2023   ALKPHOS 92 09/28/2023   AST 14 (L) 09/28/2023   ALT 8 09/28/2023   PROT 6.5 09/28/2023   ALBUMIN 3.8 09/28/2023   CALCIUM  9.1 09/28/2023   ANIONGAP 12 09/28/2023   EGFR 78.0 10/09/2023   Lab Results  Component Value Date   CHOL 241 (H) 08/23/2023   Lab Results  Component Value Date   HDL 61 08/23/2023   Lab Results  Component Value Date   LDLCALC 158 (H) 08/23/2023   Lab Results  Component Value Date   TRIG 122 08/23/2023   Lab Results  Component Value Date   CHOLHDL 4.0 08/23/2023   Lab Results  Component Value Date   HGBA1C 5.6 08/23/2023        Results for orders placed or performed in visit on 01/01/24  POC COVID-19 BinaxNow   Collection Time: 01/01/24 10:19 AM  Result Value Ref Range   SARS Coronavirus 2 Ag Negative Negative  POCT Influenza A/B   Collection Time: 01/01/24 10:19 AM  Result Value Ref Range   Influenza A, POC Negative Negative   Influenza B, POC Negative Negative     Assessment & Plan:   Assessment & Plan Acute  cough Covid and flu negative.  Orders:   POC COVID-19 BinaxNow   POCT Influenza A/B  Seasonal allergic rhinitis due to pollen Symptoms consistent with upper respiratory infection. Differential includes allergies. Negative for influenza and COVID-19. You have symptoms of an upper respiratory infection, which may also be related to allergies. Tests for influenza and COVID-19 were negative. -I believe this is more allergies . -stop allegra. Start zyrtec  -Recommend vaseline for sore nose. -Nasal saline for nasal dryness     Severe persistent asthma without complication (HCC) Continue breztri  and airsupra        Body mass index is 29.41 kg/m.SABRA         No orders of the defined types were placed in this encounter.   Orders Placed This Encounter  Procedures   POC COVID-19 BinaxNow   POCT Influenza A/B     Follow-up: Return if symptoms worsen or fail to improve.  An After Visit Summary was printed and given to the patient.   Abigail Free, MD [facility] [phone]

## 2024-01-08 ENCOUNTER — Telehealth: Payer: Self-pay

## 2024-01-08 ENCOUNTER — Other Ambulatory Visit: Payer: Self-pay | Admitting: Family Medicine

## 2024-01-08 MED ORDER — AMOXICILLIN-POT CLAVULANATE 875-125 MG PO TABS: 1.0000 | ORAL_TABLET | Freq: Two times a day (BID) | ORAL | 0 refills | Status: DC

## 2024-01-08 NOTE — Telephone Encounter (Signed)
 Are you going to send any medication for the patient. In your note said start zyrtec. Please advice  Copied from CRM [id number]. Topic: Clinical - Prescription Issue >> Jan 08, 2024 11:30 AM Hailey Greene wrote: Reason for CRM: pt came in last week for appt for upper respiratory inf. The dr forgot/did not call in medication that she prescribed. She needs that to be called in asap. Please advise.

## 2024-01-08 NOTE — Telephone Encounter (Signed)
 I left detailed message on voicemail that Dr Cox sent Augmentin  to the pharmacy.

## 2024-01-11 ENCOUNTER — Inpatient Hospital Stay: Attending: Oncology | Admitting: Oncology

## 2024-01-11 NOTE — Progress Notes (Incomplete)
 [facility]  [address] [place],  [place]  [address] [phone]  Clinic Day: 01/11/24  Referring physician: Sherre Clapper, MD   ASSESSMENT & PLAN:  Assessment: Invasive Papillary Carcinoma of the Right Breast This was not present on her biopsy but was seen on the lumpectomy specimen with a 9 mm grade 2 lesion for a T1b N0 M0, stage IA. Her prognosis is good but I do recommend she take hormonal therapy in the form of Anastrozole  1 mg daily. She will not need chemotherapy or radiation. She is in agreement with this plan.    Plan:   Hailey Greene is seen in the clinic for follow up of her newly diagnosed right breast cancer. We reviewed her history in detail and I informed her that her prognosis is good as this was found early thankfully on routine mammograms. She really doesn't have any significant risk factors for breast cancer. After reviewing her pathology I explained this is a slow growing, highly estrogen and progesterone positive, stage IA grade 2 invasive papillary carcinoma. Since she has already had her lumpectomy and is healing well, I informed her that I don't think she will need chemotherapy or radiation but will need adjuvant therapy with hormonal therapy, one pill daily for 5 years. I reviewed different hormone blockers and their side effects; such as Tamoxifen which has the potential of developing blood clots and uterine cancer. I also mentioned Anastrozole  and Letrozole which have the potential side effects of developing hot flashes, aching pains, thinning of the bones, nausea, and menopausal symptoms. I explained the importance of taking a hormone blocker as this decreases the risk of recurrence in right breast and lowers the incidence of cancer in the left breast. She agreed and I will prescribe Anastrozole  1 mg daily. Patient states that she feels well and has no complaints of pain. She has a WBC of 8.4, hemoglobin of 13.4, and platelet count of 226,000. Her CMP is  normal other than a slightly elevated creatinine of 1.03. Her PCP does routine labs and I will see her back in 3 months for reexamination.   I discussed the assessment and treatment plan with the patient.  The patient was provided an opportunity to ask questions and all were answered.  The patient agreed with the plan and demonstrated an understanding of the instructions.  The patient was advised to call back if the symptoms worsen or if the condition fails to improve as anticipated.  I provided *** minutes of face-to-face time during this this encounter and > 50% was spent counseling as documented under my assessment and plan.   Wanda VEAR Cornish, MD [facility] [address] [place] [address] Dept: [phone] Dept Fax: [phone]    CHIEF COMPLAINT:  CC: Invasive Papillary Carcinoma of the right breast.   Current Treatment:  Anastrozole  1 mg daily  HISTORY OF PRESENT ILLNESS:  Hailey Greene is a 73 y.o. female with a history of diabetes and asthma who is referred in consultation by Dr. Belinda for assessment and management of newly diagnosed papillary carcinoma of the right breast. This was found on a routine screening mammogram done on 02/01/2024, which showed a possible mass in the right breast. She then had a diagnostic mammogram and ultrasound of the right breast on 02/21/2023 which further confirmed an indeterminate 7 mm mass in the right breast at 10 o'clock, 7 cm from the  nipple. This was biopsied and pathology revealed intraductal papilloma with columnar cell changes and usual ductal hyperplasia. She then had a lumpectomy and final pathology revealed a 0.9 cm, invasive papillary carcinoma, grade 2 that had clear margins for a T1b N0 M0, stage IA. This was ER and PR positive at 100%, HER2 (0) negative, with a Ki67 of 2%. She had a MRI of the head done on 07/01/2023 which showed no acute  intracranial abnormality or significant interval change, central pontine T2 hyperintensity without associated enhancement, small mastoid effusions bilaterally with no obstructing nasopharyngeal lesion is present, and polyp or mucous retention cyst of the left maxillary sinus. She had a bone density scan on January 26, 2023 which was normal.   I have reviewed her chart and materials related to her cancer extensively and collaborated history with the patient. Summary of oncologic history is as follows: Oncology History  Breast cancer of upper-outer quadrant of right female breast (HCC)  09/13/2023 Cancer Staging   Staging form: Breast, AJCC 8th Edition - Clinical stage from 09/13/2023: Stage IA (cT1b, cN0, cM0, G2, ER+, PR+, HER2-) - Signed by Cornelius Wanda DEL, MD on 09/28/2023 Histopathologic type: Intraductal papillary adenocarcinoma with invasion Stage prefix: Initial diagnosis Method of lymph node assessment: Clinical Multigene prognostic tests performed: None Histologic grading system: 3 grade system Laterality: Right Tumor size (mm): 9 Lymph-vascular invasion (LVI): LVI not present (absent)/not identified Diagnostic confirmation: Positive histology Specimen type: Excision Staged by: Managing physician Ki-67 (%): 2 Stage used in treatment planning: Yes National guidelines used in treatment planning: Yes Type of national guideline used in treatment planning: NCCN   09/28/2023 Initial Diagnosis   Breast cancer of upper-outer quadrant of right female breast (HCC)    INTERVAL HISTORY:  Hailey Greene is seen in the clinic for follow up of her newly diagnosed highly estrogen and progesterone positive, stage IA grade 2 invasive papillary carcinoma in the right breast. Patient states that she feels *** and ***.   She visited [facility] ED on 12/18/2023 for shortness of breath, cough, diarrhea, nausea, with no etiology reported. A CT Angio Chest Pulmonary Embolism was performed which revealed no  pulmonary embolus, no acute findings in the chest, and moderate bibasilar platelike and dependent atelectasis.    She has a WBC of ***, hemoglobin of ***, and platelet count of ***.     I will see her back in *** with ***.   She denies fever, chills, night sweats, or other signs of infection. She denies cardiorespiratory and gastrointestinal issues. She  denies pain. Her appetite is *** and Her weight {Weight change:10426}. Wt Readings from Last 3 Encounters:  01/01/24 166 lb (75.3 kg)  12/27/23 160 lb (72.6 kg)  12/04/23 169 lb (76.7 kg)   We reviewed her history in detail and I informed her that her prognosis is good as this was found early thankfully on routine mammograms. She really doesn't have any significant risk factors for breast cancer. After reviewing her pathology I explained this is a slow growing, highly estrogen and progesterone positive, stage IA grade 2 invasive papillary carcinoma. Since she has already had her lumpectomy and is healing well, I informed her that I don't think she will need chemotherapy or radiation but will need adjuvant therapy with hormonal therapy one pill daily for 5 years. I reviewed different hormone blockers and their side effects; such as Tamoxifen which has the potential of developing blood clots and uterine cancer. I also mentioned Anastrozole  and Letrozole which has the  potential side effects of developing hot flashes, aching pains, thinning of the bones, nausea, and menopausal symptoms. I explained the importance of taking a hormone blocker as this decreases the risk of recurrence in right breast and lowers the incidence of cancer in the left breast. She agreed and I will prescribe Anastrozole  1 mg daily. Patient states that she feels well and has no complaints of pain. She has a WBC of 8.4, hemoglobin of 13.4, and platelet count of 226,000. Her CMP is normal other than a slightly elevated creatinine of 1.03. Her PCP does routine labs and I will see her  back in 3 months for reexamination. She had a bone density scan on January 26, 2023 which was normal. She denies fever, chills, night sweats, or other signs of infection. She denies cardiorespiratory and gastrointestinal issues. She  denies pain. Her appetite is good. Her weight 174lbs. This patient is accompanied in the office by her husband Alm.   HISTORY:   Past Medical History:  Diagnosis Date   2019 novel coronavirus disease (COVID-19) 12/31/2018   Acute hypoxemic respiratory failure due to COVID-19 (HCC)    Asthma    Bronchiectasis (HCC)    Chicken pox    Diabetes (HCC)    GERD (gastroesophageal reflux disease)    Hypertension    IBS (irritable bowel syndrome)    Major depressive disorder    Mixed hyperlipidemia    Neuromuscular disorder (HCC)    peripheral neuropathy   Neuropathy    Osteoarthritis    Pneumonia    x2   Primary insomnia    Primary osteoarthritis of left knee 05/31/2019   RLS (restless legs syndrome)    S/P total knee replacement 07/15/2019   Sleep apnea    does not use CPAP   Status post total knee replacement 07/25/2019   Tachycardia    Urge incontinence    UTI (lower urinary tract infection)    Weakness     Past Surgical History:  Procedure Laterality Date   ABDOMINAL HYSTERECTOMY     APPENDECTOMY     BREAST BIOPSY Right 04/26/2023   US  RT BREAST BX W LOC DEV 1ST LESION IMG BX SPEC US  GUIDE 04/26/2023 [facility]   BREAST BIOPSY  09/12/2023   US  RT RADIOACTIVE SEED LOC 09/12/2023 [facility]   BREAST LUMPECTOMY     CHOLECYSTECTOMY     EXCISION OF BREAST BIOPSY Right 09/13/2023   Procedure: EXCISION OF BREAST BIOPSY WITH RADIOACTIVE SEED GUIDED LOCALIZATION;  Surgeon: Belinda Cough, MD;  Location: [facility] OR;  Service: General;  Laterality: Right;  Right breast rsl excisional biopsy   HERNIA REPAIR     REPLACEMENT TOTAL KNEE Right    TONSILLECTOMY     TOTAL KNEE ARTHROPLASTY Left 07/15/2019   Procedure: TOTAL KNEE ARTHROPLASTY;   Surgeon: Rubie Kemps, MD;  Location: [facility];  Service: Orthopedics;  Laterality: Left;   TUBAL LIGATION      Family History  Problem Relation Age of Onset   Thyroid  disease Mother    Cancer Mother    Migraines Mother    Brain cancer Father    Throat cancer Father    Cancer - Other Father    Heart disease Maternal Grandmother    Diabetes Daughter     Social History:  reports that she quit smoking about 13 years ago. Her smoking use included cigarettes. She started smoking about 25 years ago. She has a 24 pack-year smoking history. She has never used smokeless tobacco. She reports  that she does not currently use alcohol. She reports that she does not use drugs.The patient is accompanied by her husband Alm today.  Allergies: Not on File  Current Medications: Current Outpatient Medications  Medication Sig Dispense Refill   acetaminophen  (TYLENOL ) 500 MG tablet Take 500 mg by mouth.     Albuterol -Budesonide  (AIRSUPRA ) 90-80 MCG/ACT AERO Inhale 2 puffs into the lungs 4 (four) times daily as needed. 10.7 g 3   amitriptyline  (ELAVIL ) 25 MG tablet TAKE 1 TABLET BY MOUTH EVERY EVENING 30 tablet 5   amoxicillin -clavulanate (AUGMENTIN ) 875-125 MG tablet Take 1 tablet by mouth 2 (two) times daily. 20 tablet 0   anastrozole  (ARIMIDEX ) 1 MG tablet Take 1 tablet (1 mg total) by mouth daily. 30 tablet 5   ASPIRIN  81 PO Take 81 mg by mouth daily.     Blood Glucose Monitoring Suppl DEVI 1 each by Does not apply route daily. E11.9 May substitute to any manufacturer covered by patient's insurance. 1 each 0   Blood Glucose Monitoring Suppl DEVI 1 each by Does not apply route in the morning, at noon, and at bedtime. May substitute to any manufacturer covered by patient's insurance. 1 each 0   budeson-glycopyrrolate-formoterol  (BREZTRI  AEROSPHERE) 160-9-4.8 MCG/ACT AERO Inhale 2 puffs into the lungs in the morning and at bedtime.     busPIRone  (BUSPAR ) 15 MG tablet TAKE 1 TABLET (15 MG TOTAL) BY MOUTH  DAILY. 90 tablet 1   carbamide peroxide (DEBROX) 6.5 % OTIC solution Place 5 drops into the left ear 2 (two) times daily. 15 mL 0   Cyanocobalamin  (VITAMIN B-12) 2500 MCG SUBL Place 1 tablet (2,500 mcg total) under the tongue daily. DISSOLVE 1 TABLET UNDER THE TONGUE DAILY 90 tablet 3   dicyclomine  (BENTYL ) 20 MG tablet TAKE 1 TABLET (20 MG TOTAL) BY MOUTH 4 (FOUR) TIMES DAILY AS NEEDED (INTESTINAL SPASMS). 360 tablet 1   Eszopiclone  3 MG TABS Take 1 tablet (3 mg total) by mouth at bedtime. 30 tablet 2   ezetimibe  (ZETIA ) 10 MG tablet TAKE 1 TABLET BY MOUTH EVERY DAY 90 tablet 1   fluticasone  (FLONASE ) 50 MCG/ACT nasal spray SPRAY 2 SPRAYS INTO EACH NOSTRIL EVERY DAY 48 g 2   glucose blood (ONETOUCH VERIO) test strip E11.42 Use new test strip each time when checking FBS 100 each 2   hydrOXYzine  (VISTARIL ) 25 MG capsule Take 1 capsule (25 mg total) by mouth at bedtime as needed. 90 capsule 0   icosapent  Ethyl (VASCEPA ) 1 g capsule TAKE 2 CAPSULES BY MOUTH TWICE  DAILY 120 capsule 11   ipratropium-albuterol  (DUONEB) 0.5-2.5 (3) MG/3ML SOLN Take 3 mLs by nebulization every 6 (six) hours as needed (SOB. WHEEZING). 360 mL 1   Lancets (ONETOUCH DELICA PLUS LANCET33G) MISC E 11.42 Use new lancet each time when checking FBS 100 each 2   loratadine  (CLARITIN ) 10 MG tablet Take 1 tablet (10 mg total) by mouth daily. 90 tablet 3   mirtazapine  (REMERON ) 15 MG tablet Take 15 mg by mouth at bedtime.     Multiple Vitamin (MULTIVITAMIN WITH MINERALS) TABS tablet Take 1 tablet by mouth daily.     omeprazole  (PRILOSEC) 20 MG capsule TAKE 1 CAPSULE BY MOUTH EVERY DAY 90 capsule 1   ondansetron  (ZOFRAN -ODT) 4 MG disintegrating tablet Take 4 mg by mouth every 6 (six) hours as needed for nausea or vomiting.     phentermine  (ADIPEX-P ) 37.5 MG tablet Take 1 tablet (37.5 mg total) by mouth every morning. 30 tablet 0  pregabalin  (LYRICA ) 300 MG capsule Take 1 capsule (300 mg total) by mouth 2 (two) times daily. 60 capsule  2   ramipril  (ALTACE ) 2.5 MG capsule TAKE 1 CAPSULE BY MOUTH EVERY DAY 90 capsule 1   rOPINIRole  (REQUIP ) 3 MG tablet Take 1 tablet (3 mg total) by mouth daily. 90 tablet 0   rosuvastatin  (CRESTOR ) 40 MG tablet Take 1 tablet (40 mg total) by mouth daily. 90 tablet 0   Semaglutide , 2 MG/DOSE, (OZEMPIC , 2 MG/DOSE,) 8 MG/3ML SOPN INJECT 2 MG AS DIRECTED ONCE A WEEK. 3 mL 2   solifenacin  (VESICARE ) 5 MG tablet Take 1 tablet (5 mg total) by mouth daily. 90 tablet 0   SYMBICORT  160-4.5 MCG/ACT inhaler INHALE 2 PUFFS INTO THE LUNGS TWICE A DAY 10.2 each 3   topiramate  (TOPAMAX ) 50 MG tablet Take 1 tablet (50 mg total) by mouth daily. 90 tablet 0   vortioxetine  HBr (TRINTELLIX ) 20 MG TABS tablet Take 1 tablet (20 mg total) by mouth daily. 90 tablet 0   No current facility-administered medications for this visit.    REVIEW OF SYSTEMS:  Review of Systems  Constitutional: Negative.  Negative for appetite change, chills, fever and unexpected weight change.  HENT:  Negative.  Negative for lump/mass, mouth sores and sore throat.   Eyes: Negative.   Respiratory: Negative.  Negative for chest tightness, cough, hemoptysis, shortness of breath and wheezing.   Cardiovascular: Negative.  Negative for chest pain, leg swelling and palpitations.  Gastrointestinal: Negative.  Negative for abdominal distention, abdominal pain, blood in stool, constipation, diarrhea, nausea and vomiting.  Endocrine: Negative.   Genitourinary: Negative.  Negative for difficulty urinating, dysuria, frequency and hematuria.   Musculoskeletal: Negative.  Negative for arthralgias, back pain, flank pain and gait problem.  Skin: Negative.  Negative for itching, rash and wound.  Neurological:  Negative for dizziness, extremity weakness, gait problem, headaches, light-headedness, numbness, seizures and speech difficulty.  Hematological: Negative.  Negative for adenopathy. Does not bruise/bleed easily.  Psychiatric/Behavioral: Negative.   Negative for depression and sleep disturbance. The patient is not nervous/anxious.    VITALS:  There were no vitals taken for this visit.  Wt Readings from Last 3 Encounters:  01/01/24 166 lb (75.3 kg)  12/27/23 160 lb (72.6 kg)  12/04/23 169 lb (76.7 kg)    There is no height or weight on file to calculate BMI.  Performance status (ECOG): 0 - Asymptomatic  PHYSICAL EXAM:  Physical Exam Vitals and nursing note reviewed. Exam conducted with a chaperone present.  Constitutional:      General: She is not in acute distress.    Appearance: Normal appearance. She is normal weight.  HENT:     Head: Normocephalic and atraumatic.     Right Ear: Tympanic membrane, ear canal and external ear normal. There is no impacted cerumen.     Left Ear: Tympanic membrane, ear canal and external ear normal. There is no impacted cerumen.     Nose: Nose normal. No congestion or rhinorrhea.     Mouth/Throat:     Mouth: Mucous membranes are moist.     Pharynx: Oropharynx is clear. No oropharyngeal exudate or posterior oropharyngeal erythema.  Eyes:     General: No scleral icterus.       Right eye: No discharge.        Left eye: No discharge.     Extraocular Movements: Extraocular movements intact.     Conjunctiva/sclera: Conjunctivae normal.     Pupils: Pupils are equal,  round, and reactive to light.  Cardiovascular:     Rate and Rhythm: Normal rate and regular rhythm.     Pulses: Normal pulses.     Heart sounds: Normal heart sounds. No murmur heard.    No friction rub. No gallop.  Pulmonary:     Effort: Pulmonary effort is normal. No respiratory distress.     Breath sounds: Normal breath sounds.  Chest:     Comments: Well healed scar in the lateral right breast at about 9 o'clock with some mild swelling around it.  Prominent venous pattern in the upper left chest wall Abdominal:     General: Bowel sounds are normal. There is no distension.     Palpations: Abdomen is soft. There is no  hepatomegaly, splenomegaly or mass.     Tenderness: There is no abdominal tenderness. There is no right CVA tenderness, left CVA tenderness, guarding or rebound.     Hernia: No hernia is present.  Musculoskeletal:        General: Normal range of motion.     Cervical back: Normal range of motion and neck supple.     Right lower leg: No edema.     Left lower leg: No edema.  Lymphadenopathy:     Cervical: No cervical adenopathy.     Right cervical: No superficial, deep or posterior cervical adenopathy.    Left cervical: No superficial, deep or posterior cervical adenopathy.     Upper Body:     Right upper body: No supraclavicular, axillary or pectoral adenopathy.     Left upper body: No supraclavicular, axillary or pectoral adenopathy.  Skin:    General: Skin is warm and dry.  Neurological:     General: No focal deficit present.     Mental Status: She is alert and oriented to person, place, and time. Mental status is at baseline.  Psychiatric:        Mood and Affect: Mood normal.        Behavior: Behavior normal.        Thought Content: Thought content normal.        Judgment: Judgment normal.      LABS:      Latest Ref Rng & Units 09/28/2023    2:26 PM 08/23/2023   11:24 AM 07/05/2023   11:21 AM  CBC  WBC 4.0 - 10.5 K/uL 8.4  7.6  9.6   Hemoglobin 12.0 - 15.0 g/dL 86.5  86.2  85.1   Hematocrit 36.0 - 46.0 % 41.3  44.7  45.7   Platelets 150 - 400 K/uL 226  233  271       Latest Ref Rng & Units 09/28/2023    2:26 PM 08/23/2023   11:24 AM 07/05/2023   11:21 AM  CMP  Glucose 70 - 99 mg/dL 890  98  92   BUN 8 - 23 mg/dL 23  17  10    Creatinine 0.44 - 1.00 mg/dL 8.96  9.20  9.30   Sodium 135 - 145 mmol/L 137  134  138   Potassium 3.5 - 5.1 mmol/L 3.9  4.9  5.2   Chloride 98 - 111 mmol/L 102  100  100   CO2 22 - 32 mmol/L 22  20  21    Calcium  8.9 - 10.3 mg/dL 9.1  9.0  9.7   Total Protein 6.5 - 8.1 g/dL 6.5  6.0  6.4   Total Bilirubin 0.0 - 1.2 mg/dL 0.3  0.4  0.4   Alkaline  Phos 38 - 126 U/L 92  90  84   AST 15 - 41 U/L 14  15  26    ALT 0 - 44 U/L 8  10  20       No results found for: CEA1, CEA / No results found for: CEA1, CEA No results found for: PSA1 No results found for: CAN199 No results found for: RJW874  Lab Results  Component Value Date   TOTALPROTELP 6.8 03/11/2014   ALBUMINELP 56.1 03/11/2014   A1GS 5.6 (H) 03/11/2014   A2GS 13.4 (H) 03/11/2014   BETS 7.1 03/11/2014   BETA2SER 6.0 03/11/2014   GAMS 11.8 03/11/2014   MSPIKE NOT DET 03/11/2014   SPEI SEE NOTE 03/11/2014   Lab Results  Component Value Date   FERRITIN 321 (H) 01/04/2019   FERRITIN 326 (H) 01/03/2019   FERRITIN 353 (H) 01/02/2019   Lab Results  Component Value Date   LDH 152 12/31/2018    STUDIES:  EXAM: 12/18/2023 CT ANGIO CHEST PULMONARY EMBOLISM IMPRESSION 1.  No pulmonary embolus.  2.  No acute findings in the chest.  3.  Moderate bibasilar platelike and dependent atelectasis.   EXAM: 12/06/2023 DG CHEST PORTABLE IMPRESSION: Negative for acute cardiopulmonary disease  Pathology: 09/13/2023 A. BREAST, RIGHT, LUMPECTOMY:       Invasive papillary carcinoma, 0.9 cm, grade 2       Margins: All margins negative for invasive carcinoma  Closest, invasive:  0.5 mm from posterior margin  Lymphovascular invasion:  Not identified  Estrogen Receptor:  100% positive Progesterone Receptor:  100% positive Proliferation Marker Ki-67:  2%  HER2 (0) negative  EXAM: 07/01/2023 MRI HEAD WITHOUT AND WITH CONTRAST IMPRESSION: 1. No acute intracranial abnormality or significant interval change. 2. Central pontine T2 hyperintensity without associated enhancement. This may be related to chronic microvascular ischemia. Benign venous lesion is also considered. No other significant white matter disease is present. 3. Small mastoid effusions bilaterally. No obstructing nasopharyngeal lesion is present. 4. Polyp or mucous retention cyst of the left maxillary  sinus.  Exam: 02/21/2023 Digital Diagnostic Unilateral Right Mammogram with Tomosynthesis and CAD; Ultrasound of the Right Breast Impression: There is an indeterminate 7mm in the right breast at 10 o'clock. No evidence of right axillary lymphadenopathy  EXAM: 02/01/2024 DIGITAL SCREENING BILATERAL MAMMOGRAM WITH TOMOSYNTHESIS AND CAD IMPRESSION: Further evaluation is suggested for a possible mass in the right breast. RECOMMENDATION: Diagnostic mammogram and possibly ultrasound of the right breast. (Code:[id number]-R-1M)  LILLETTE Aretta HERO Lowe,acting as a scribe for Wanda VEAR Cornish, MD.,have documented all relevant documentation on the behalf of Wanda VEAR Cornish, MD,as directed by  Wanda VEAR Cornish, MD while in the presence of Wanda VEAR Cornish, MD.  I have reviewed this report as typed by the medical scribe, and it is complete and accurate.

## 2024-01-15 ENCOUNTER — Telehealth: Payer: Self-pay | Admitting: Oncology

## 2024-01-15 NOTE — Progress Notes (Unsigned)
 Subjective:  Patient ID: Hailey Greene, female    DOB: 1950/09/05  Age: 73 y.o. MRN: [id number]  Chief Complaint  Patient presents with   Medical Management of Chronic Issues    HPI: Discussed the use of AI scribe software for clinical note transcription with the patient, who gave verbal consent to proceed.  History of Present Illness Hailey Greene is a 73 year old female who presents for follow-up after recent hospitalization for bowel obstruction.  Bowel obstruction and gastrointestinal symptoms - Recent hospitalization for bowel obstruction with acute blockage - Concern for recurrence and inquires about home management options - Bowel movements occur every other day - No regular constipation or vomiting since discharge  Respiratory and infectious symptoms - Intermittent dyspnea - Recent fever of 100.61F after hospital discharge - Sore throat and facial rash, similar to prior episodes of streptococcal pharyngitis - No earaches or severe vomiting  Sleep disturbance and anxiety - Difficulty sleeping despite use of multiple medications (mirtazapine , Lunesta , amitriptyline ) - Amitriptyline  provides some benefit; mirtazapine  and Lunesta  are ineffective - Hydroxyzine  used as needed for anxiety with minimal effect - Significant anxiety, describes nerves as 'shot'  Antihypertensive medication management - Ramipril  was held during hospitalization due to hypotension - Ramipril  has been resumed since discharge  Diabetes mellitus and medication tolerance - On Ozempic  since May without prior constipation issues - Blood glucose stable, with morning readings around 110 mg/dL - No use of Dexcom device - Uncertain of current hemoglobin A1c  Other chronic medication use - Lyrica  for neuropathy - Topamax  for headaches (no current headaches) - Claritin  for allergies - Vascepa  for cholesterol - Breztri  for breathing issues       12/27/2023    2:02 PM 12/27/2023   12:29 PM  11/30/2023    2:21 PM 11/23/2023    5:52 PM 11/14/2023    4:08 PM  Depression screen PHQ 2/9  Decreased Interest 0 1 1 0 0  Down, Depressed, Hopeless 0 1 1 0 0  PHQ - 2 Score 0 2 2 0 0  Altered sleeping  1 1    Tired, decreased energy  2 2    Change in appetite  2 2    Feeling bad or failure about yourself    1    Trouble concentrating  0 0    Moving slowly or fidgety/restless  0 0    Suicidal thoughts  0 0    PHQ-9 Score  7 8    Difficult doing work/chores  Somewhat difficult Somewhat difficult          12/27/2023    2:15 PM  Fall Risk   Falls in the past year? 1  Number falls in past yr: 0  Injury with Fall? 1  Risk for fall due to : History of fall(s);Impaired balance/gait;Impaired mobility  Follow up Falls evaluation completed    Patient Care Team: Sherre Clapper, MD as PCP - General (Family Medicine) Rubie Kemps, MD as Consulting Physician (Orthopedic Surgery) Corlis Joen BIRCH, RN as Oncology Nurse Navigator Kay Hendricks MATSU, RN as [facility] Care Management   Review of Systems  Current Outpatient Medications on File Prior to Visit  Medication Sig Dispense Refill   acetaminophen  (TYLENOL ) 500 MG tablet Take 500 mg by mouth.     Albuterol -Budesonide  (AIRSUPRA ) 90-80 MCG/ACT AERO Inhale 2 puffs into the lungs 4 (four) times daily as needed. 10.7 g 3   amitriptyline  (ELAVIL ) 25 MG tablet TAKE 1 TABLET BY MOUTH EVERY EVENING  30 tablet 5   anastrozole  (ARIMIDEX ) 1 MG tablet Take 1 tablet (1 mg total) by mouth daily. 30 tablet 5   ASPIRIN  81 PO Take 81 mg by mouth daily.     Blood Glucose Monitoring Suppl DEVI 1 each by Does not apply route daily. E11.9 May substitute to any manufacturer covered by patient's insurance. 1 each 0   Blood Glucose Monitoring Suppl DEVI 1 each by Does not apply route in the morning, at noon, and at bedtime. May substitute to any manufacturer covered by patient's insurance. 1 each 0   budeson-glycopyrrolate-formoterol  (BREZTRI  AEROSPHERE) 160-9-4.8 MCG/ACT  AERO Inhale 2 puffs into the lungs in the morning and at bedtime.     busPIRone  (BUSPAR ) 15 MG tablet TAKE 1 TABLET (15 MG TOTAL) BY MOUTH DAILY. 90 tablet 1   carbamide peroxide (DEBROX) 6.5 % OTIC solution Place 5 drops into the left ear 2 (two) times daily. (Patient taking differently: Place 5 drops into the left ear as needed.) 15 mL 0   Cyanocobalamin  (VITAMIN B-12) 2500 MCG SUBL Place 1 tablet (2,500 mcg total) under the tongue daily. DISSOLVE 1 TABLET UNDER THE TONGUE DAILY 90 tablet 3   dicyclomine  (BENTYL ) 20 MG tablet TAKE 1 TABLET (20 MG TOTAL) BY MOUTH 4 (FOUR) TIMES DAILY AS NEEDED (INTESTINAL SPASMS). 360 tablet 1   ezetimibe  (ZETIA ) 10 MG tablet TAKE 1 TABLET BY MOUTH EVERY DAY 90 tablet 1   fluticasone  (FLONASE ) 50 MCG/ACT nasal spray SPRAY 2 SPRAYS INTO EACH NOSTRIL EVERY DAY 48 g 2   glucose blood (ONETOUCH VERIO) test strip E11.42 Use new test strip each time when checking FBS 100 each 2   hydrOXYzine  (VISTARIL ) 25 MG capsule Take 1 capsule (25 mg total) by mouth at bedtime as needed. 90 capsule 0   icosapent  Ethyl (VASCEPA ) 1 g capsule TAKE 2 CAPSULES BY MOUTH TWICE  DAILY 120 capsule 11   ipratropium-albuterol  (DUONEB) 0.5-2.5 (3) MG/3ML SOLN Take 3 mLs by nebulization every 6 (six) hours as needed (SOB. WHEEZING). 360 mL 1   Lancets (ONETOUCH DELICA PLUS LANCET33G) MISC E 11.42 Use new lancet each time when checking FBS 100 each 2   loratadine  (CLARITIN ) 10 MG tablet Take 1 tablet (10 mg total) by mouth daily. 90 tablet 3   Multiple Vitamin (MULTIVITAMIN WITH MINERALS) TABS tablet Take 1 tablet by mouth daily.     omeprazole  (PRILOSEC) 20 MG capsule TAKE 1 CAPSULE BY MOUTH EVERY DAY 90 capsule 1   ondansetron  (ZOFRAN -ODT) 4 MG disintegrating tablet Take 4 mg by mouth every 6 (six) hours as needed for nausea or vomiting.     phentermine  (ADIPEX-P ) 37.5 MG tablet Take 1 tablet (37.5 mg total) by mouth every morning. 30 tablet 0   pregabalin  (LYRICA ) 300 MG capsule Take 1 capsule  (300 mg total) by mouth 2 (two) times daily. 60 capsule 2   ramipril  (ALTACE ) 2.5 MG capsule TAKE 1 CAPSULE BY MOUTH EVERY DAY 90 capsule 1   rOPINIRole  (REQUIP ) 3 MG tablet Take 1 tablet (3 mg total) by mouth daily. 90 tablet 0   rosuvastatin  (CRESTOR ) 40 MG tablet Take 1 tablet (40 mg total) by mouth daily. 90 tablet 0   Semaglutide , 2 MG/DOSE, (OZEMPIC , 2 MG/DOSE,) 8 MG/3ML SOPN INJECT 2 MG AS DIRECTED ONCE A WEEK. 3 mL 2   solifenacin  (VESICARE ) 5 MG tablet Take 1 tablet (5 mg total) by mouth daily. 90 tablet 0   topiramate  (TOPAMAX ) 50 MG tablet Take 1 tablet (50 mg  total) by mouth daily. 90 tablet 0   vortioxetine  HBr (TRINTELLIX ) 20 MG TABS tablet Take 1 tablet (20 mg total) by mouth daily. 90 tablet 0   No current facility-administered medications on file prior to visit.   Past Medical History:  Diagnosis Date   2019 novel coronavirus disease (COVID-19) 12/31/2018   Acute hypoxemic respiratory failure due to COVID-19 (HCC)    Asthma    Bronchiectasis (HCC)    Chicken pox    Diabetes (HCC)    GERD (gastroesophageal reflux disease)    Hypertension    IBS (irritable bowel syndrome)    Major depressive disorder    Mixed hyperlipidemia    Neuromuscular disorder (HCC)    peripheral neuropathy   Neuropathy    Osteoarthritis    Pneumonia    x2   Primary insomnia    Primary osteoarthritis of left knee 05/31/2019   RLS (restless legs syndrome)    S/P total knee replacement 07/15/2019   Sleep apnea    does not use CPAP   Status post total knee replacement 07/25/2019   Tachycardia    Urge incontinence    UTI (lower urinary tract infection)    Weakness    Past Surgical History:  Procedure Laterality Date   ABDOMINAL HYSTERECTOMY     APPENDECTOMY     BREAST BIOPSY Right 04/26/2023   US  RT BREAST BX W LOC DEV 1ST LESION IMG BX SPEC US  GUIDE 04/26/2023 [facility]   BREAST BIOPSY  09/12/2023   US  RT RADIOACTIVE SEED LOC 09/12/2023 [facility]   BREAST LUMPECTOMY      CHOLECYSTECTOMY     EXCISION OF BREAST BIOPSY Right 09/13/2023   Procedure: EXCISION OF BREAST BIOPSY WITH RADIOACTIVE SEED GUIDED LOCALIZATION;  Surgeon: Belinda Cough, MD;  Location: [facility];  Service: General;  Laterality: Right;  Right breast rsl excisional biopsy   HERNIA REPAIR     REPLACEMENT TOTAL KNEE Right    TONSILLECTOMY     TOTAL KNEE ARTHROPLASTY Left 07/15/2019   Procedure: TOTAL KNEE ARTHROPLASTY;  Surgeon: Rubie Kemps, MD;  Location: [facility];  Service: Orthopedics;  Laterality: Left;   TUBAL LIGATION      Family History  Problem Relation Age of Onset   Thyroid  disease Mother    Cancer Mother    Migraines Mother    Brain cancer Father    Throat cancer Father    Cancer - Other Father    Heart disease Maternal Grandmother    Diabetes Daughter    Social History   Socioeconomic History   Marital status: Married    Spouse name: Alm   Number of children: 3   Years of education: 12   Highest education level: Not on file  Occupational History   Occupation: Housewife  Tobacco Use   Smoking status: Former    Current packs/day: 0.00    Average packs/day: 2.0 packs/day for 12.0 years (24.0 ttl pk-yrs)    Types: Cigarettes    Start date: 05/25/1998    Quit date: 05/25/2010    Years since quitting: 13.6   Smokeless tobacco: Never   Tobacco comments:    Used to smoke 1-2 packs a days  Vaping Use   Vaping status: Never Used  Substance and Sexual Activity   Alcohol use: Not Currently    Comment: past occasionally   Drug use: Never   Sexual activity: Not Currently    Birth control/protection: Surgical    Comment: hyst  Other Topics Concern  Not on file  Social History Narrative   Born and raised in [place], [place]. Currently lives in a house with her husband. 1 dog. Fun: Garden, feed birds, swimming   Denies any religious beliefs effecting health care.    Social Drivers of Health   Financial Resource Strain: Medium Risk (10/02/2023)   Overall Financial Resource  Strain (CARDIA)    Difficulty of Paying Living Expenses: Somewhat hard  Food Insecurity: Low Risk  (01/18/2024)   Received from [facility]   Hunger Vital Sign    Within the past 12 months, you worried that your food would run out before you got money to buy more: Never true    Within the past 12 months, the food you bought just didn't last and you didn't have money to get more. : Never true  Recent Concern: Food Insecurity - Food Insecurity Present (12/27/2023)   Hunger Vital Sign    Worried About Running Out of Food in the Last Year: Sometimes true    Ran Out of Food in the Last Year: Sometimes true  Transportation Needs: No Transportation Needs (01/18/2024)   Received from [facility]    In the past 12 months, has lack of reliable transportation kept you from medical appointments, meetings, work or from getting things needed for daily living? : No  Physical Activity: Insufficiently Active (08/19/2023)   Exercise Vital Sign    Days of Exercise per Week: 4 days    Minutes of Exercise per Session: 20 min  Stress: No Stress Concern Present (09/28/2023)   [name] of Occupational [employer] - Occupational Stress Questionnaire    Feeling of Stress: Not at all  Recent Concern: Stress - Stress Concern Present (08/19/2023)   [name]ational [employer] - Occupational Stress Questionnaire    Feeling of Stress : Rather much  Social Connections: Socially Integrated (11/23/2023)   Social Connection and Isolation Panel    Frequency of Communication with Friends and Family: More than three times a week    Frequency of Social Gatherings with Friends and Family: Three times a week    Attends Religious Services: More than 4 times per year    Active Member of Clubs or Organizations: Yes    Attends Banker Meetings: More than 4 times per year    Marital Status: Married    Objective:  BP 118/60   Pulse (!) 102   Temp (!) 97.3 F (36.3 C)   Resp 18    Ht 5' 3 (1.6 m)   Wt 164 lb (74.4 kg)   SpO2 98%   BMI 29.05 kg/m      01/16/2024    2:14 PM 01/01/2024    9:51 AM 12/27/2023    2:09 PM  BP/Weight  Systolic BP 118 122   Diastolic BP 60 68   Wt. (Lbs) 164 166 160  BMI 29.05 kg/m2 29.41 kg/m2 28.34 kg/m2    Physical Exam Vitals reviewed.  Constitutional:      Appearance: Normal appearance.  HENT:     Right Ear: Tympanic membrane, ear canal and external ear normal.     Left Ear: Tympanic membrane, ear canal and external ear normal.     Nose: Nose normal.     Mouth/Throat:     Pharynx: Oropharynx is clear.  Neck:     Vascular: No carotid bruit.  Cardiovascular:     Rate and Rhythm: Normal rate and regular rhythm.     Pulses: Normal pulses.  Heart sounds: Normal heart sounds. No murmur heard. Pulmonary:     Effort: Pulmonary effort is normal. No respiratory distress.     Breath sounds: Normal breath sounds.  Abdominal:     General: Bowel sounds are normal.     Palpations: Abdomen is soft.     Tenderness: There is no abdominal tenderness.  Lymphadenopathy:     Cervical: No cervical adenopathy.  Skin:    Findings: Rash (macular rash on cheeks.) present.  Neurological:     Mental Status: She is alert and oriented to person, place, and time.  Psychiatric:        Mood and Affect: Mood normal.        Behavior: Behavior normal.     {Perform Simple Foot Exam  Perform Detailed exam:1} Diabetic foot exam was performed with the following findings:   No deformities, ulcerations, or other skin breakdown Normal sensation of 10g monofilament Intact posterior tibialis and dorsalis pedis pulses      Lab Results  Component Value Date   WBC 8.4 09/28/2023   HGB 13.4 09/28/2023   HCT 41.3 09/28/2023   PLT 226 09/28/2023   GLUCOSE 109 (H) 09/28/2023   CHOL 241 (H) 08/23/2023   TRIG 122 08/23/2023   HDL 61 08/23/2023   LDLCALC 158 (H) 08/23/2023   ALT 8 09/28/2023   AST 14 (L) 09/28/2023   NA 137 09/28/2023   K  3.9 09/28/2023   CL 102 09/28/2023   CREATININE 1.03 (H) 09/28/2023   BUN 23 09/28/2023   CO2 22 09/28/2023   TSH 0.966 08/23/2023   HGBA1C 5.1 01/16/2024    Results for orders placed or performed in visit on 01/16/24  POCT glycosylated hemoglobin (Hb A1C)   Collection Time: 01/16/24  3:02 PM  Result Value Ref Range   Hemoglobin A1C     HbA1c POC (<> result, manual entry) 5.1 4.0 - 5.6 %   HbA1c, POC (prediabetic range)     HbA1c, POC (controlled diabetic range)    POCT Lipid Panel   Collection Time: 01/16/24  3:05 PM  Result Value Ref Range   TC 127    HDL 39    TRG 135    LDL 61    Non-HDL 88    TC/HDL 1.6   .  Assessment & Plan:   Assessment & Plan Diabetic polyneuropathy associated with type 2 diabetes mellitus (HCC) Blood sugar levels around 110. A1c not recently checked. - Order A1c test. Orders:   POCT glycosylated hemoglobin (Hb A1C)  Benign essential hypertension Ramipril  resumed after being held during hospitalization.    Malignant neoplasm of upper-outer quadrant of right breast in female, estrogen receptor positive (HCC) Managed with anastrozole .    Mild recurrent major depression Anxiety and depression present. Anxiety may contribute to insomnia. - Increase buspirone  to 15 mg twice daily. - Increase hydroxyzine  to 25 mg three times a day as needed.    Mixed hyperlipidemia Managed with Vascepa  and Crestor . - Order cholesterol panel. Orders:   POCT Lipid Panel  Other constipation Constipation risk and prevention after resolved bowel obstruction Recent bowel obstruction resolved. Risk of constipation remains due to medication regimen. - Recommend Miralax 17 grams daily to maintain soft bowel movements at least three times a week. - Increase Miralax dose if no bowel movement for two days.    Primary insomnia Difficulty falling asleep. Mirtazapine  and Lunesta  ineffective. Amitriptyline  provides some benefit. - Discontinue mirtazapine  due to  ineffectiveness and potential for weight gain. -  Discontinue Lunesta  due to ineffectiveness.    GAD (generalized anxiety disorder) Anxiety and depression present. Anxiety may contribute to insomnia. - Increase buspirone  to 15 mg twice daily. - Increase hydroxyzine  to 25 mg three times a day as needed.    Idiopathic progressive neuropathy Managed with Lyrica  300 mg twice daily.    Simple chronic bronchitis (HCC) Managed with Breztri . Symbicort  unnecessary. - Discontinue Symbicort . - Continue Breztri . - Follow up with pulmonary specialist next month.    Urge incontinence Managed with Vesticare.    Seasonal allergic rhinitis due to pollen Claritin  prescribed.    Rash Rash on face and sore throat reported. Rash possibly related to recent vomiting or strep throat history.    Other migraine without status migrainosus, not intractable Topamax  prescribed.    Gastro-esophageal reflux disease without esophagitis The current medical regimen is effective;  continue present plan and medications.  Omeprazole  prescribed.     Overweight with body mass index (BMI) of 29 to 29.9 in adult Ozempic  prescribed. Constipation noted as a common side effect.     Body mass index is 29.05 kg/m.   No orders of the defined types were placed in this encounter.   Orders Placed This Encounter  Procedures   POCT Lipid Panel   POCT glycosylated hemoglobin (Hb A1C)    I,Marla I Leal-Borjas,acting as a scribe for Abigail Free, MD.,have documented all relevant documentation on the behalf of Abigail Free, MD,as directed by  Abigail Free, MD while in the presence of Abigail Free, MD.  Follow-up: Return in about 3 months (around 04/17/2024) for chronic follow up.  An After Visit Summary was printed and given to the patient.  Abigail Free, MD [facility] [phone]

## 2024-01-15 NOTE — Telephone Encounter (Signed)
 Patient has been scheduled for follow-up visit per 01/12/24 LOS.  Pt aware of scheduled appt details.

## 2024-01-16 ENCOUNTER — Ambulatory Visit (INDEPENDENT_AMBULATORY_CARE_PROVIDER_SITE_OTHER): Admitting: Family Medicine

## 2024-01-16 ENCOUNTER — Encounter: Payer: Self-pay | Admitting: Family Medicine

## 2024-01-16 VITALS — BP 118/60 | HR 102 | Temp 97.3°F | Resp 18 | Ht 63.0 in | Wt 164.0 lb

## 2024-01-16 DIAGNOSIS — F330 Major depressive disorder, recurrent, mild: Secondary | ICD-10-CM | POA: Diagnosis not present

## 2024-01-16 DIAGNOSIS — G43809 Other migraine, not intractable, without status migrainosus: Secondary | ICD-10-CM

## 2024-01-16 DIAGNOSIS — E782 Mixed hyperlipidemia: Secondary | ICD-10-CM

## 2024-01-16 DIAGNOSIS — G603 Idiopathic progressive neuropathy: Secondary | ICD-10-CM

## 2024-01-16 DIAGNOSIS — R21 Rash and other nonspecific skin eruption: Secondary | ICD-10-CM

## 2024-01-16 DIAGNOSIS — C50411 Malignant neoplasm of upper-outer quadrant of right female breast: Secondary | ICD-10-CM | POA: Diagnosis not present

## 2024-01-16 DIAGNOSIS — N3941 Urge incontinence: Secondary | ICD-10-CM

## 2024-01-16 DIAGNOSIS — K5909 Other constipation: Secondary | ICD-10-CM

## 2024-01-16 DIAGNOSIS — J410 Simple chronic bronchitis: Secondary | ICD-10-CM

## 2024-01-16 DIAGNOSIS — E1142 Type 2 diabetes mellitus with diabetic polyneuropathy: Secondary | ICD-10-CM | POA: Diagnosis not present

## 2024-01-16 DIAGNOSIS — I10 Essential (primary) hypertension: Secondary | ICD-10-CM | POA: Diagnosis not present

## 2024-01-16 DIAGNOSIS — E663 Overweight: Secondary | ICD-10-CM

## 2024-01-16 DIAGNOSIS — F411 Generalized anxiety disorder: Secondary | ICD-10-CM

## 2024-01-16 DIAGNOSIS — F5101 Primary insomnia: Secondary | ICD-10-CM

## 2024-01-16 DIAGNOSIS — K219 Gastro-esophageal reflux disease without esophagitis: Secondary | ICD-10-CM

## 2024-01-16 DIAGNOSIS — J301 Allergic rhinitis due to pollen: Secondary | ICD-10-CM

## 2024-01-16 LAB — POCT GLYCOSYLATED HEMOGLOBIN (HGB A1C): HbA1c POC (<> result, manual entry): 5.1 % (ref 4.0–5.6)

## 2024-01-16 LAB — POCT LIPID PANEL
HDL: 39
LDL: 61
Non-HDL: 88
TC/HDL: 1.6
TC: 127
TRG: 135

## 2024-01-16 NOTE — Assessment & Plan Note (Addendum)
 Managed with anastrozole .

## 2024-01-16 NOTE — Assessment & Plan Note (Addendum)
 Anxiety and depression present. Anxiety may contribute to insomnia. - Increase buspirone  to 15 mg twice daily. - Increase hydroxyzine  to 25 mg three times a day as needed.

## 2024-01-16 NOTE — Assessment & Plan Note (Addendum)
 Managed with Vascepa  and Crestor . - Order cholesterol panel. Orders:   POCT Lipid Panel

## 2024-01-16 NOTE — Assessment & Plan Note (Addendum)
 Ramipril  resumed after being held during hospitalization.

## 2024-01-16 NOTE — Patient Instructions (Addendum)
 Insomnia: Recommend discontinue mirtazapine  and Lunesta  as they are ineffective.  Continue amitriptyline  at current dose.  Constipation: Take MiraLAX 1 tablespoon daily.  Increase if not effective.  May take up to 1 dose twice daily.  Cholesterol is at goal.  Recommend continue current cholesterol medications.  Diabetes is excellent A1c is 5.1.  Anxiety and depression uncontrolled  - Increase buspirone  to 15 mg twice daily - Increase hydroxyzine  to 25 mg 3 times daily as needed  Respiratory issues: Discontinue Symbicort .  Continue Breztri  2 puffs twice daily.  Be sure to make a follow-up with Dr. Mardee.                     Contains text generated by Abridge.                                 Contains text generated by Abridge.                          Contains text generated by Abridge.                                 Contains text generated by [employer]bridge.

## 2024-01-16 NOTE — Assessment & Plan Note (Addendum)
 Blood sugar levels around 110. A1c not recently checked. - Order A1c test. Orders:   POCT glycosylated hemoglobin (Hb A1C)

## 2024-01-17 ENCOUNTER — Other Ambulatory Visit: Payer: Self-pay

## 2024-01-17 ENCOUNTER — Ambulatory Visit: Payer: Self-pay

## 2024-01-17 MED ORDER — TRAZODONE HCL 50 MG PO TABS: 50.0000 mg | ORAL_TABLET | Freq: Every day | ORAL | 0 refills | Status: DC

## 2024-01-17 NOTE — Telephone Encounter (Signed)
 FYI Only or Action Required?: FYI only for provider.  Patient was last seen in primary care on 01/16/2024 by Sherre Clapper, MD.  Called Nurse Triage reporting Insomnia.  Symptoms began several months ago.  Interventions attempted: Prescription medications: Amitryptilline.  Symptoms are: unchanged.  Triage Disposition: Call PCP Within 24 Hours  Patient/caregiver understands and will follow disposition?: Yes Patient seen by PCP on 10/21 and discussed insomnia during that visit. Pt called to report that she took her amitryptilline last night around 6pm and tried laying down around 7pm, but was unable to fall asleep.  She then took her husbands Trazadone 100mg  around midnight and fell asleep shortly after. Pt reports that she sleep 4.5/5hours uniterrupted and is now asking if Dr. Sherre can order her her own prescription.   Message from Bonduel C sent at 01/17/2024  8:39 AM EDT  Summary: sleep concern / rx req   Reason for Triage: The patient shares that they've been experiencing sleep difficulties for a significant time and has recently tried 100 MG trazodone  and would like to discuss the option of receiving a prescription.         Reason for Disposition  [1] Caller has NON-URGENT question (includes prescribed medication questions) AND [2] triager unable to answer  Answer Assessment - Initial Assessment Questions 1. MAIN CONCERN OR SYMPTOM:  What is your main concern right now? What question do you have? What's the main symptom you're worried about? (e.g., breathing difficulty, cough, fever, pain)     Insomnia  2. ONSET: When did the  insomnia  start?     Several months  3. BETTER-SAME-WORSE: Are you getting better, staying the same, or getting worse compared to how you felt at your last visit to the doctor (most recent medical visit)?     Stayed the same  4. VISIT DATE: When were you seen? (e.g., date)     10/21  5. VISIT DOCTOR: What is the name of the doctor taking  care of you now?     Dr. Sherre  6. VISIT DIAGNOSIS:  What was the main symptom or problem that you were seen for? Were you given a diagnosis?      Seen for medical management of chronic issue  7. VISIT MEDICINES: Did the doctor order any new medicines for you to use? If Yes, ask: Have you filled the prescription and started taking the medicine?      Amitryptilline and Buspirone   8. NEXT APPOINTMENT: Have you scheduled a follow-up appointment with your doctor?     04/24/24  9. PAIN: Is there any pain? If Yes, ask: How bad is it?  (Scale 0-10; or none, mild, moderate, severe)     No  10. FEVER: Do you have a fever? If Yes, ask: What is it, how was it measured  and when did it start?       No  11. OTHER SYMPTOMS: Do you have any other symptoms?       No  12. PREGNANCY: Is there any chance you are pregnant? When was your last menstrual period?       no  Protocols used: Recent Medical Visit for Illness Follow-up Call-[facility]

## 2024-01-20 DIAGNOSIS — K5909 Other constipation: Secondary | ICD-10-CM | POA: Insufficient documentation

## 2024-01-20 DIAGNOSIS — J301 Allergic rhinitis due to pollen: Secondary | ICD-10-CM | POA: Insufficient documentation

## 2024-01-20 DIAGNOSIS — G43909 Migraine, unspecified, not intractable, without status migrainosus: Secondary | ICD-10-CM | POA: Insufficient documentation

## 2024-01-20 DIAGNOSIS — E663 Overweight: Secondary | ICD-10-CM | POA: Insufficient documentation

## 2024-01-20 NOTE — Assessment & Plan Note (Signed)
 Difficulty falling asleep. Mirtazapine  and Lunesta  ineffective. Amitriptyline  provides some benefit. - Discontinue mirtazapine  due to ineffectiveness and potential for weight gain. - Discontinue Lunesta  due to ineffectiveness.

## 2024-01-20 NOTE — Assessment & Plan Note (Signed)
 Constipation risk and prevention after resolved bowel obstruction Recent bowel obstruction resolved. Risk of constipation remains due to medication regimen. - Recommend Miralax 17 grams daily to maintain soft bowel movements at least three times a week. - Increase Miralax dose if no bowel movement for two days.

## 2024-01-20 NOTE — Assessment & Plan Note (Signed)
Claritin prescribed.

## 2024-01-20 NOTE — Assessment & Plan Note (Signed)
 Managed with Breztri . Symbicort  unnecessary. - Discontinue Symbicort . - Continue Breztri . - Follow up with pulmonary specialist next month.

## 2024-01-20 NOTE — Assessment & Plan Note (Signed)
 The current medical regimen is effective;  continue present plan and medications.  Omeprazole  prescribed.

## 2024-01-20 NOTE — Assessment & Plan Note (Signed)
 Managed with Lyrica  300 mg twice daily.

## 2024-01-20 NOTE — Assessment & Plan Note (Signed)
Topamax prescribed

## 2024-01-20 NOTE — Assessment & Plan Note (Signed)
 Managed with Vesticare.

## 2024-01-20 NOTE — Assessment & Plan Note (Signed)
 Rash on face and sore throat reported. Rash possibly related to recent vomiting or strep throat history.

## 2024-01-20 NOTE — Assessment & Plan Note (Signed)
 Ozempic  prescribed. Constipation noted as a common side effect.

## 2024-01-22 ENCOUNTER — Telehealth: Payer: Self-pay

## 2024-01-22 ENCOUNTER — Other Ambulatory Visit: Payer: Self-pay | Admitting: Family Medicine

## 2024-01-22 DIAGNOSIS — R112 Nausea with vomiting, unspecified: Secondary | ICD-10-CM

## 2024-01-22 DIAGNOSIS — R935 Abnormal findings on diagnostic imaging of other abdominal regions, including retroperitoneum: Secondary | ICD-10-CM

## 2024-01-22 NOTE — Patient Instructions (Signed)
 Visit Information  Thank you for taking time to visit with me today. Please don't hesitate to contact me if I can be of assistance to you before our next scheduled telephone appointment.  Our next appointment is by telephone on 01/29/24 in the afternoon  Following is a copy of your care plan:   Goals Addressed             This Visit's Progress    VBCI Transitions of Care (TOC) Care Plan       Problems:  Recent Hospitalization for treatment of Nausea & vomiting - Lesion of colon  SDOH barrier: patient requesting assistance with food insecurity - referral made and scheduled appt with Thersia Hoar for 01/23/24 3pm  Goal:  Over the next 30 days, the patient will not experience hospital readmission  Interventions:  Transitions of Care: Doctor Visits  - discussed the importance of doctor visits Contacted provider for patient needs via secure chat with PCP to request GI referral  Arranged PCP follow-up within 7 days (Care Guide Scheduled)  Patient Self Care Activities:  Attend all scheduled provider appointments Call pharmacy for medication refills 3-7 days in advance of running out of medications Call provider office for new concerns or questions  Notify RN Care Manager of [facility] call rescheduling needs Participate in Transition of Care Program/Attend [facility] scheduled calls Take medications as prescribed    Plan:  Telephone follow up appointment with care management team member scheduled for:  01/28/22 pm The patient has been provided with contact information for the care management team and has been advised to call with any health related questions or concerns.         Patient verbalizes understanding of instructions and care plan provided today and agrees to view in MyChart. Active MyChart status and patient understanding of how to access instructions and care plan via MyChart confirmed with patient.     Telephone follow up appointment with care management team member scheduled  for:01/29/24 The patient has been provided with contact information for the care management team and has been advised to call with any health related questions or concerns.   Please call the care guide team at [phone] if you need to cancel or reschedule your appointment.   Please call the Suicide and Crisis Lifeline: 988 call 1-800-273-TALK (toll free, 24 hour hotline) call 911 if you are experiencing a Mental Health or Behavioral Health Crisis or need someone to talk to.  Shona Prow RN, CCM [place]  VBCI-Population Health RN Care Manager [phone]

## 2024-01-22 NOTE — Transitions of Care (Post Inpatient/ED Visit) (Signed)
 01/22/2024  Name: Hailey Greene MRN: [id number] DOB: 27-Dec-1950  Today's TOC FU Call Status: Today's TOC FU Call Status:: Successful TOC FU Call Completed TOC FU Call Complete Date: 01/18/24 Patient's Name and Date of Birth confirmed.  Transition Care Management Follow-up Telephone Call Date of Discharge: 01/19/24 Discharge Facility: Other ([facility]) Name of Other ([facility]) Discharge Facility: [facility] Type of Discharge: Inpatient Admission Primary Inpatient Discharge Diagnosis:: Nausea & vomiting - Lesion of colon How have you been since you were released from the hospital?: Better Any questions or concerns?: Yes Patient Questions/Concerns:: Patient needed referral to GI - messaged PCP via secure chat  Items Reviewed: Did you receive and understand the discharge instructions provided?: No Medications obtained,verified, and reconciled?: Yes (Medications Reviewed) Any new allergies since your discharge?: No Dietary orders reviewed?: NA Do you have support at home?: Yes People in Home [RPT]: child(ren), adult, spouse Name of Support/Comfort Primary Source: son and husband  Medications Reviewed Today: Medications Reviewed Today     Reviewed by Lauro Shona DELENA, RN (Registered Nurse) on 01/22/24 at 1439  Med List Status: <None>   Medication Order Taking? Sig Documenting Provider Last Dose Status Informant  acetaminophen  (TYLENOL ) 500 MG tablet [id number] Yes Take 500 mg by mouth.  Patient taking differently: Take 500 mg by mouth every 6 (six) hours as needed for mild pain (pain score 1-3).   [provider]  Active   Albuterol -Budesonide  (AIRSUPRA ) 90-80 MCG/ACT AERO [id number] Yes Inhale 2 puffs into the lungs 4 (four) times daily as needed.  Patient taking differently: Inhale 2 puffs into the lungs every 4 (four) hours as needed (shortness/wheezing).   Cox, Kirsten, MD  Active   amitriptyline  (ELAVIL ) 25 MG tablet [id number] Yes TAKE 1 TABLET BY MOUTH EVERY  EVENING Cox, Kirsten, MD  Active   anastrozole  (ARIMIDEX ) 1 MG tablet [id number] Yes Take 1 tablet (1 mg total) by mouth daily. Cornelius Wanda DEL, MD  Active   ASPIRIN  81 PO [id number] Yes Take 81 mg by mouth daily. [provider]  Active Self, Pharmacy Records, Spouse/Significant Other  Blood Glucose Monitoring Suppl DEVI [id number] Yes 1 each by Does not apply route daily. E11.9 May substitute to any manufacturer covered by patient's insurance. Sherre Clapper, MD  Active Self, Pharmacy Records, Spouse/Significant Other  Blood Glucose Monitoring Suppl DEVI [id number] Yes 1 each by Does not apply route in the morning, at noon, and at bedtime. May substitute to any manufacturer covered by patient's insurance. CoxClapper, MD  Active Self, Pharmacy Records, Spouse/Significant Other  budeson-glycopyrrolate-formoterol  (BREZTRI  AEROSPHERE) 160-9-4.8 MCG/ACT TERESE [id number]  Inhale 2 puffs into the lungs in the morning and at bedtime.  Patient not taking: Reported on 01/22/2024   CoxClapper, MD  Consider Medication Status and Discontinue (Discontinued by provider) Self, Pharmacy Records, Spouse/Significant Other  budesonide -formoterol  (SYMBICORT ) 160-4.5 MCG/ACT inhaler [id number] Yes Inhale 2 puffs into the lungs 2 (two) times daily. [provider]  Active   busPIRone  (BUSPAR ) 15 MG tablet [id number] Yes TAKE 1 TABLET (15 MG TOTAL) BY MOUTH DAILY. Teressa Harrie HERO, FNP  Active   carbamide peroxide (DEBROX) 6.5 % OTIC solution [id number] Yes Place 5 drops into the left ear 2 (two) times daily. Teressa Harrie HERO, FNP  Active Self, Pharmacy Records, Spouse/Significant Other  Cyanocobalamin  (VITAMIN B-12) 2500 MCG SUBL [id number] Yes Place 1 tablet (2,500 mcg total) under the tongue daily. DISSOLVE 1 TABLET UNDER THE TONGUE DAILY Cox, Kirsten, MD  Active Self, Pharmacy Records, Spouse/Significant Other  dicyclomine  (  BENTYL ) 20 MG tablet [id number] Yes TAKE 1 TABLET (20 MG TOTAL) BY MOUTH 4 (FOUR) TIMES  DAILY AS NEEDED (INTESTINAL SPASMS). Sherre Clapper, MD  Active   Eszopiclone  3 MG TABS [id number] Yes Take 3 mg by mouth at bedtime. Take immediately before bedtime [provider]  Active   ezetimibe  (ZETIA ) 10 MG tablet [id number] Yes TAKE 1 TABLET BY MOUTH EVERY DAY Cox, Kirsten, MD  Active   fluticasone  (FLONASE ) 50 MCG/ACT nasal spray [id number] Yes SPRAY 2 SPRAYS INTO EACH NOSTRIL EVERY DAY Cox, Kirsten, MD  Active Self, Pharmacy Records, Spouse/Significant Other  glucose blood (ONETOUCH VERIO) test strip [id number] Yes E11.42 Use new test strip each time when checking FBS Cox, Kirsten, MD  Active Self, Pharmacy Records, Spouse/Significant Other  hydrOXYzine  (VISTARIL ) 25 MG capsule [id number] Yes Take 1 capsule (25 mg total) by mouth at bedtime as needed. Teressa Harrie HERO, FNP  Active   icosapent  Ethyl (VASCEPA ) 1 g capsule [id number]  TAKE 2 CAPSULES BY MOUTH TWICE  DAILY  Patient not taking: Reported on 01/22/2024   CoxClapper, MD  Active Self, Pharmacy Records, Spouse/Significant Other  ipratropium-albuterol  (DUONEB) 0.5-2.5 (3) MG/3ML SOLN [id number]  Take 3 mLs by nebulization every 6 (six) hours as needed (SOB. WHEEZING).  Patient not taking: Reported on 01/22/2024   Sherre Clapper, MD  Active Self, Pharmacy Records, Spouse/Significant Other  Lancets [facility] [facility] [place]) [place] [id number] Yes E 11.42 Use new lancet each time when checking FBS Cox, Kirsten, MD  Active Self, Pharmacy Records, Spouse/Significant Other  loratadine  (CLARITIN ) 10 MG tablet [id number] Yes Take 1 tablet (10 mg total) by mouth daily. Cox, Kirsten, MD  Active Self, Pharmacy Records, Spouse/Significant Other  Multiple Vitamin (MULTIVITAMIN WITH MINERALS) TABS tablet [id number] Yes Take 1 tablet by mouth daily. [provider]  Active Spouse/Significant Other, Self, Pharmacy Records  omeprazole  (PRILOSEC) 20 MG capsule [id number] Yes TAKE 1 CAPSULE BY MOUTH EVERY DAY Cox, Kirsten, MD  Active    ondansetron  (ZOFRAN -ODT) 4 MG disintegrating tablet [id number] Yes Take 4 mg by mouth every 6 (six) hours as needed for nausea or vomiting. [provider]  Active Self, Pharmacy Records, Spouse/Significant Other  phentermine  (ADIPEX-P ) 37.5 MG tablet [id number]  Take 1 tablet (37.5 mg total) by mouth every morning.  Patient not taking: Reported on 01/22/2024   CoxClapper, MD  Active Self, Pharmacy Records, Spouse/Significant Other  polyethylene glycol (MIRALAX / GLYCOLAX) 17 g packet [id number] Yes Take 17 g by mouth daily as needed for mild constipation. [provider]  Active   pregabalin  (LYRICA ) 300 MG capsule [id number] Yes Take 1 capsule (300 mg total) by mouth 2 (two) times daily. CoxClapper, MD  Active Self, Pharmacy Records, Spouse/Significant Other  ramipril  (ALTACE ) 2.5 MG capsule [id number] Yes TAKE 1 CAPSULE BY MOUTH EVERY DAY Cox, Kirsten, MD  Active   rOPINIRole  (REQUIP ) 3 MG tablet [id number] Yes Take 1 tablet (3 mg total) by mouth daily. Milon Cleaves, PA  Active   rosuvastatin  (CRESTOR ) 40 MG tablet [id number] Yes Take 1 tablet (40 mg total) by mouth daily. Milon Cleaves, PA  Active   Semaglutide , 2 MG/DOSE, (OZEMPIC , 2 MG/DOSE,) 8 MG/3ML SOPN [id number] Yes INJECT 2 MG AS DIRECTED ONCE A WEEK. Cox, Kirsten, MD  Active   solifenacin  (VESICARE ) 5 MG tablet [id number] Yes Take 1 tablet (5 mg total) by mouth daily. Milon Cleaves, PA  Active   topiramate  (TOPAMAX ) 50 MG tablet [id number] Yes Take 1 tablet (50 mg total) by mouth daily.  Milon Cleaves, PA  Active   traZODone  (DESYREL ) 50 MG tablet [id number]  Take 1 tablet (50 mg total) by mouth at bedtime.  Patient not taking: Reported on 01/22/2024   CoxAbigail, MD  Active   vortioxetine  HBr (TRINTELLIX ) 20 MG TABS tablet [id number] Yes Take 1 tablet (20 mg total) by mouth daily. Milon Cleaves, PA  Active             Home Care and Equipment/Supplies: Were Home Health Services Ordered?: No Any new equipment or medical  supplies ordered?: No  Functional Questionnaire: Do you need assistance with bathing/showering or dressing?: No Do you need assistance with meal preparation?: No Do you need assistance with eating?: No Do you have difficulty maintaining continence: No Do you need assistance with getting out of bed/getting out of a chair/moving?: No Do you have difficulty managing or taking your medications?: No  Follow up appointments reviewed: PCP Follow-up appointment confirmed?: Yes Date of PCP follow-up appointment?: 01/26/24 Follow-up Provider: [facility] Follow-up appointment confirmed?:  (sent message to PCP about GI referral) Do you need transportation to your follow-up appointment?: No Do you understand care options if your condition(s) worsen?: Yes-patient verbalized understanding  SDOH Interventions Today    Flowsheet Row Most Recent Value  SDOH Interventions   Food Insecurity Interventions Intervention Not Indicated  Housing Interventions Intervention Not Indicated  Transportation Interventions Intervention Not Indicated  Utilities Interventions Intervention Not Indicated    Goals Addressed             This Visit's Progress    VBCI Transitions of Care (TOC) Care Plan       Problems:  Recent Hospitalization for treatment of Nausea & vomiting - Lesion of colon  SDOH barrier: patient requesting assistance with food insecurity - referral made and scheduled appt with Thersia Hoar for 01/23/24 3pm  Goal:  Over the next 30 days, the patient will not experience hospital readmission  Interventions:  Transitions of Care: Doctor Visits  - discussed the importance of doctor visits Contacted provider for patient needs via secure chat with PCP to request GI referral  Arranged PCP follow-up within 7 days (Care Guide Scheduled)  Patient Self Care Activities:  Attend all scheduled provider appointments Call pharmacy for medication refills 3-7 days in advance of running  out of medications Call provider office for new concerns or questions  Notify RN Care Manager of [facility] call rescheduling needs Participate in Transition of Care Program/Attend [facility] scheduled calls Take medications as prescribed    Plan:  Telephone follow up appointment with care management team member scheduled for:  01/28/22 pm The patient has been provided with contact information for the care management team and has been advised to call with any health related questions or concerns.         Shona Prow RN, CCM [place]  VBCI-Population Health RN Care Manager [phone]

## 2024-01-23 ENCOUNTER — Other Ambulatory Visit

## 2024-01-23 NOTE — Patient Outreach (Signed)
 Social Drivers of Health  Community Resource and Care Coordination Visit Note   01/23/2024  Name: Hailey Greene MRN: [id number] DOB:01-31-1951  Situation: Referral received for [facility] needs assessment and assistance related to Food Insecurity . I obtained verbal consent from Patient.  Visit completed with Patient on the phone.   Background:   SDOH Interventions Today    Flowsheet Row Most Recent Value  SDOH Interventions   Food Insecurity Interventions Community Resources Provided     Assessment:   Goals Addressed             This Visit's Progress    BSW VBCI Social Work Care Plan       Problems:   Food Insecurity   CSW Clinical Goal(s):   Over the next 30 days the Patient will work with Child Psychotherapist to address concerns related to food.  Interventions:  SW will mail food resources to address on file.  Patient Goals/Self-Care Activities:  Follow up with resources for community food options.  Plan:   The care management team will reach out to the patient again over the next 15 days.        Recommendation:   call and/or follow up with resources for food assistance  Follow Up Plan:   Telephone follow-up 15 days  Thersia Hoar, BSW, [place] [facility]  [facility] Social Worker, Population Health [phone]

## 2024-01-23 NOTE — Patient Instructions (Signed)
 Visit Information  Thank you for taking time to visit with me today. Please don't hesitate to contact me if I can be of assistance to you before our next scheduled appointment.  Our next appointment is by telephone on 11/6 at 1:00pm Please call the care guide team at [phone] if you need to cancel or reschedule your appointment.   Following is a copy of your care plan:   Goals Addressed             This Visit's Progress    BSW VBCI Social Work Care Plan       Problems:   Food Insecurity   CSW Clinical Goal(s):   Over the next 30 days the Patient will work with Child Psychotherapist to address concerns related to food.  Interventions:  SW will mail food resources to address on file.  Patient Goals/Self-Care Activities:  Follow up with resources for community food options.  Plan:   The care management team will reach out to the patient again over the next 15 days.        Please call the Suicide and Crisis Lifeline: 988 call the [place]  National Suicide Prevention Lifeline: 330-103-0756 or TTY: 650-199-4996 TTY 506-739-5237) to talk to a trained counselor call 1-800-273-TALK (toll free, 24 hour hotline) go to [facility] [address] [place] [phone]) call 911 if you are experiencing a Mental Health or Behavioral Health Crisis or need someone to talk to.  Patient verbalized understanding of Care plan and visit instructions communicated this visit Thersia Hoar, BSW, [facility] [place]  Value Based Care Institute Social Worker, Population Health [phone]

## 2024-01-24 ENCOUNTER — Telehealth: Payer: Self-pay

## 2024-01-26 ENCOUNTER — Encounter: Payer: Self-pay | Admitting: Family Medicine

## 2024-01-26 ENCOUNTER — Telehealth: Payer: Self-pay | Admitting: Internal Medicine

## 2024-01-26 ENCOUNTER — Telehealth: Payer: Self-pay | Admitting: Family Medicine

## 2024-01-26 ENCOUNTER — Ambulatory Visit (INDEPENDENT_AMBULATORY_CARE_PROVIDER_SITE_OTHER): Admitting: Family Medicine

## 2024-01-26 VITALS — BP 132/74 | HR 95 | Temp 98.2°F | Ht 63.0 in | Wt 169.0 lb

## 2024-01-26 DIAGNOSIS — R935 Abnormal findings on diagnostic imaging of other abdominal regions, including retroperitoneum: Secondary | ICD-10-CM

## 2024-01-26 DIAGNOSIS — K529 Noninfective gastroenteritis and colitis, unspecified: Secondary | ICD-10-CM | POA: Diagnosis not present

## 2024-01-26 DIAGNOSIS — J181 Lobar pneumonia, unspecified organism: Secondary | ICD-10-CM | POA: Diagnosis not present

## 2024-01-26 NOTE — Telephone Encounter (Signed)
 Copied from CRM #[id number]. Topic: Clinical - Prescription Issue >> Jan 26, 2024 12:30 PM Delon HERO wrote: Reason for CRM: Patient is calling to request a refill for busPIRone  (BUSPAR ) 15 MG tablet [[id number]] Sig states Sig: TAKE 1 TABLET (15 MG TOTAL) BY MOUTH DAILY. Per Dr. Sherre increased to 2 per day. Patient is requesting a refill and sig to be changed. Please advise [id number] - [place], [place] - [address] Phone: [phone]  Fax: [phone]

## 2024-01-26 NOTE — Telephone Encounter (Signed)
 do not have any availability for a couple of weeks based upon my review of the scheduling.  I have never seen this patient so I do not think she has to see me but I am willing to see her on November 13 overbook spot or we could find an appointment with an app.  She has a thickened transverse colon on a CT [facility] and she was just discharged from the hospital.  I think being seen within a few weeks is reasonable.     CT chest abdomen pelvis with contrast 01/18/2024-[facility] 1. Early changes of right upper lobe pneumonia.  2. Focal thickening noted in the transverse colon which is very atypical. A colon cancer is possible with this appearance and follow-up colonoscopy recommended. Of note, this is not clearly visible on the recent prior study but there was a large amount of stool on that examination which may obscure this finding.

## 2024-01-26 NOTE — Progress Notes (Signed)
 Subjective:  Patient ID: Hailey Greene, female    DOB: 1950-11-08  Age: 73 y.o. MRN: [id number]  Chief Complaint  Patient presents with   Hospitalization Follow-up    HPI: Discussed the use of AI scribe software for clinical note transcription with the patient, who gave verbal consent to proceed.  History of Present Illness Hailey Greene is a 73 year old female who presents for follow-up after recent hospitalization for colitis and pneumonia.  Gastrointestinal symptoms and colitis - Hospitalized from October 23rd to October 24th for colitis. - Experienced nausea, vomiting, and inability to tolerate food during hospitalization. - Focal thickening of the transverse colon noted on imaging. - No current abdominal pain or discomfort. - No recent fevers, chills, or sweats. - Blood count has remained stable with no drop in hemoglobin. - Appetite has improved since discharge.  Respiratory symptoms and pneumonia - Hospitalized for early pneumonia in the right upper lobe. - Treated with ceftriaxone  and azithromycin . - Experienced shortness of breath and tachycardia during hospitalization. - No cough or fever present. - Tests for influenza, COVID-19, and RSV were negative. - Breathing has improved since discharge.  Cardiac evaluation - Cardiac proteins were negative during hospitalization. - EKG was normal.       12/27/2023    2:02 PM 12/27/2023   12:29 PM 11/30/2023    2:21 PM 11/23/2023    5:52 PM 11/14/2023    4:08 PM  Depression screen PHQ 2/9  Decreased Interest 0 1 1 0 0  Down, Depressed, Hopeless 0 1 1 0 0  PHQ - 2 Score 0 2 2 0 0  Altered sleeping  1 1    Tired, decreased energy  2 2    Change in appetite  2 2    Feeling bad or failure about yourself    1    Trouble concentrating  0 0    Moving slowly or fidgety/restless  0 0    Suicidal thoughts  0 0    PHQ-9 Score  7 8    Difficult doing work/chores  Somewhat difficult Somewhat difficult          12/27/2023     2:15 PM  Fall Risk   Falls in the past year? 1  Number falls in past yr: 0  Injury with Fall? 1  Risk for fall due to : History of fall(s);Impaired balance/gait;Impaired mobility  Follow up Falls evaluation completed    Patient Care Team: Sherre Clapper, MD as PCP - General (Family Medicine) Rubie Kemps, MD as Consulting Physician (Orthopedic Surgery) Corlis Joen BIRCH, RN as Oncology Nurse Navigator Kay Hendricks MATSU, RN as [facility] Management Lauro Shona DELENA, RN as Registered Nurse Delene Thersia JINNY georgann DESIREE Care Management   Review of Systems  Constitutional:  Negative for chills, fatigue and fever.  HENT:  Negative for congestion, ear pain and sore throat.   Respiratory:  Negative for cough and shortness of breath.   Cardiovascular:  Negative for chest pain.  Gastrointestinal:  Negative for abdominal pain, constipation, diarrhea, nausea and vomiting.  Genitourinary:  Negative for dysuria and urgency.  Musculoskeletal:  Negative for arthralgias and myalgias.  Skin:  Negative for rash.  Neurological:  Negative for dizziness and headaches.  Psychiatric/Behavioral:  Negative for dysphoric mood. The patient is not nervous/anxious.     Current Outpatient Medications on File Prior to Visit  Medication Sig Dispense Refill   traZODone  (DESYREL ) 50 MG tablet Take 1 tablet (50 mg total) by  mouth at bedtime. 90 tablet 0   acetaminophen  (TYLENOL ) 500 MG tablet Take 500 mg by mouth. (Patient taking differently: Take 500 mg by mouth every 6 (six) hours as needed for mild pain (pain score 1-3).)     Albuterol -Budesonide  (AIRSUPRA ) 90-80 MCG/ACT AERO Inhale 2 puffs into the lungs 4 (four) times daily as needed. (Patient taking differently: Inhale 2 puffs into the lungs every 4 (four) hours as needed (shortness/wheezing).) 10.7 g 3   amitriptyline  (ELAVIL ) 25 MG tablet TAKE 1 TABLET BY MOUTH EVERY EVENING 30 tablet 5   anastrozole  (ARIMIDEX ) 1 MG tablet Take 1 tablet (1 mg total) by mouth  daily. 30 tablet 5   ASPIRIN  81 PO Take 81 mg by mouth daily.     Blood Glucose Monitoring Suppl DEVI 1 each by Does not apply route daily. E11.9 May substitute to any manufacturer covered by patient's insurance. 1 each 0   Blood Glucose Monitoring Suppl DEVI 1 each by Does not apply route in the morning, at noon, and at bedtime. May substitute to any manufacturer covered by patient's insurance. 1 each 0   budesonide -formoterol  (SYMBICORT ) 160-4.5 MCG/ACT inhaler Inhale 2 puffs into the lungs 2 (two) times daily.     busPIRone  (BUSPAR ) 15 MG tablet TAKE 1 TABLET (15 MG TOTAL) BY MOUTH DAILY. 90 tablet 1   carbamide peroxide (DEBROX) 6.5 % OTIC solution Place 5 drops into the left ear 2 (two) times daily. 15 mL 0   Cyanocobalamin  (VITAMIN B-12) 2500 MCG SUBL Place 1 tablet (2,500 mcg total) under the tongue daily. DISSOLVE 1 TABLET UNDER THE TONGUE DAILY 90 tablet 3   dicyclomine  (BENTYL ) 20 MG tablet TAKE 1 TABLET (20 MG TOTAL) BY MOUTH 4 (FOUR) TIMES DAILY AS NEEDED (INTESTINAL SPASMS). 360 tablet 1   Eszopiclone  3 MG TABS Take 3 mg by mouth at bedtime. Take immediately before bedtime     ezetimibe  (ZETIA ) 10 MG tablet TAKE 1 TABLET BY MOUTH EVERY DAY 90 tablet 1   fluticasone  (FLONASE ) 50 MCG/ACT nasal spray SPRAY 2 SPRAYS INTO EACH NOSTRIL EVERY DAY 48 g 2   glucose blood (ONETOUCH VERIO) test strip E11.42 Use new test strip each time when checking FBS 100 each 2   hydrOXYzine  (VISTARIL ) 25 MG capsule Take 1 capsule (25 mg total) by mouth at bedtime as needed. 90 capsule 0   Lancets (ONETOUCH DELICA PLUS LANCET33G) MISC E 11.42 Use new lancet each time when checking FBS 100 each 2   loratadine  (CLARITIN ) 10 MG tablet Take 1 tablet (10 mg total) by mouth daily. 90 tablet 3   Multiple Vitamin (MULTIVITAMIN WITH MINERALS) TABS tablet Take 1 tablet by mouth daily.     omeprazole  (PRILOSEC) 20 MG capsule TAKE 1 CAPSULE BY MOUTH EVERY DAY 90 capsule 1   ondansetron  (ZOFRAN -ODT) 4 MG disintegrating  tablet Take 4 mg by mouth every 6 (six) hours as needed for nausea or vomiting.     polyethylene glycol (MIRALAX / GLYCOLAX) 17 g packet Take 17 g by mouth daily as needed for mild constipation.     pregabalin  (LYRICA ) 300 MG capsule Take 1 capsule (300 mg total) by mouth 2 (two) times daily. 60 capsule 2   ramipril  (ALTACE ) 2.5 MG capsule TAKE 1 CAPSULE BY MOUTH EVERY DAY 90 capsule 1   rOPINIRole  (REQUIP ) 3 MG tablet Take 1 tablet (3 mg total) by mouth daily. 90 tablet 0   rosuvastatin  (CRESTOR ) 40 MG tablet Take 1 tablet (40 mg total) by mouth daily.  90 tablet 0   Semaglutide , 2 MG/DOSE, (OZEMPIC , 2 MG/DOSE,) 8 MG/3ML SOPN INJECT 2 MG AS DIRECTED ONCE A WEEK. 3 mL 2   solifenacin  (VESICARE ) 5 MG tablet Take 1 tablet (5 mg total) by mouth daily. 90 tablet 0   topiramate  (TOPAMAX ) 50 MG tablet Take 1 tablet (50 mg total) by mouth daily. 90 tablet 0   vortioxetine  HBr (TRINTELLIX ) 20 MG TABS tablet Take 1 tablet (20 mg total) by mouth daily. 90 tablet 0   No current facility-administered medications on file prior to visit.   Past Medical History:  Diagnosis Date   2019 novel coronavirus disease (COVID-19) 12/31/2018   Acute hypoxemic respiratory failure due to COVID-19 (HCC)    Asthma    Bronchiectasis (HCC)    Chicken pox    Diabetes (HCC)    GERD (gastroesophageal reflux disease)    Hypertension    IBS (irritable bowel syndrome)    Major depressive disorder    Mixed hyperlipidemia    Neuromuscular disorder (HCC)    peripheral neuropathy   Neuropathy    Osteoarthritis    Pneumonia    x2   Primary insomnia    Primary osteoarthritis of left knee 05/31/2019   RLS (restless legs syndrome)    S/P total knee replacement 07/15/2019   Sleep apnea    does not use CPAP   Status post total knee replacement 07/25/2019   Tachycardia    Urge incontinence    UTI (lower urinary tract infection)    Weakness    Past Surgical History:  Procedure Laterality Date   ABDOMINAL HYSTERECTOMY      APPENDECTOMY     BREAST BIOPSY Right 04/26/2023   US  RT BREAST BX W LOC DEV 1ST LESION IMG BX SPEC US  GUIDE 04/26/2023 [facility]   BREAST BIOPSY  09/12/2023   US  RT RADIOACTIVE SEED LOC 09/12/2023 [facility]   BREAST LUMPECTOMY     CHOLECYSTECTOMY     EXCISION OF BREAST BIOPSY Right 09/13/2023   Procedure: EXCISION OF BREAST BIOPSY WITH RADIOACTIVE SEED GUIDED LOCALIZATION;  Surgeon: Belinda Cough, MD;  Location: [facility] OR;  Service: General;  Laterality: Right;  Right breast rsl excisional biopsy   HERNIA REPAIR     REPLACEMENT TOTAL KNEE Right    TONSILLECTOMY     TOTAL KNEE ARTHROPLASTY Left 07/15/2019   Procedure: TOTAL KNEE ARTHROPLASTY;  Surgeon: Rubie Kemps, MD;  Location: [facility];  Service: Orthopedics;  Laterality: Left;   TUBAL LIGATION      Family History  Problem Relation Age of Onset   Thyroid  disease Mother    Cancer Mother    Migraines Mother    Brain cancer Father    Throat cancer Father    Cancer - Other Father    Heart disease Maternal Grandmother    Diabetes Daughter    Social History   Socioeconomic History   Marital status: Married    Spouse name: Alm   Number of children: 3   Years of education: 12   Highest education level: Not on file  Occupational History   Occupation: Housewife  Tobacco Use   Smoking status: Former    Current packs/day: 0.00    Average packs/day: 2.0 packs/day for 12.0 years (24.0 ttl pk-yrs)    Types: Cigarettes    Start date: 05/25/1998    Quit date: 05/25/2010    Years since quitting: 13.6   Smokeless tobacco: Never   Tobacco comments:    Used to smoke 1-2 packs  a days  Vaping Use   Vaping status: Never Used  Substance and Sexual Activity   Alcohol use: Not Currently    Comment: past occasionally   Drug use: Never   Sexual activity: Not Currently    Birth control/protection: Surgical    Comment: hyst  Other Topics Concern   Not on file  Social History Narrative   Born and raised in [place], [place].  Currently lives in a house with her husband. 1 dog. Fun: Garden, feed birds, swimming   Denies any religious beliefs effecting health care.    Social Drivers of Health   Financial Resource Strain: Medium Risk (10/02/2023)   Overall Financial Resource Strain (CARDIA)    Difficulty of Paying Living Expenses: Somewhat hard  Food Insecurity: Food Insecurity Present (01/22/2024)   Hunger Vital Sign    Worried About Running Out of Food in the Last Year: Sometimes true    Ran Out of Food in the Last Year: Sometimes true  Transportation Needs: No Transportation Needs (01/22/2024)   PRAPARE - Administrator, Civil Service (Medical): No    Lack of Transportation (Non-Medical): No  Physical Activity: Insufficiently Active (08/19/2023)   Exercise Vital Sign    Days of Exercise per Week: 4 days    Minutes of Exercise per Session: 20 min  Stress: No Stress Concern Present (09/28/2023)   [name] of Occupational Health - Occupational Stress Questionnaire    Feeling of Stress: Not at all  Recent Concern: Stress - Stress Concern Present (08/19/2023)   [name] of Occupational Health - Occupational Stress Questionnaire    Feeling of Stress : Rather much  Social Connections: Socially Integrated (11/23/2023)   Social Connection and Isolation Panel    Frequency of Communication with Friends and Family: More than three times a week    Frequency of Social Gatherings with Friends and Family: Three times a week    Attends Religious Services: More than 4 times per year    Active Member of Clubs or Organizations: Yes    Attends Banker Meetings: More than 4 times per year    Marital Status: Married    Objective:  BP 132/74   Pulse 95   Temp 98.2 F (36.8 C)   Ht 5' 3 (1.6 m)   Wt 169 lb (76.7 kg)   SpO2 97%   BMI 29.94 kg/m      01/26/2024   10:57 AM 01/16/2024    2:14 PM 01/01/2024    9:51 AM  BP/Weight  Systolic BP 132 118 122  Diastolic BP 74 60 68   Wt. (Lbs) 169 164 166  BMI 29.94 kg/m2 29.05 kg/m2 29.41 kg/m2    Physical Exam Vitals reviewed.  Constitutional:      Appearance: Normal appearance. She is normal weight.  Cardiovascular:     Rate and Rhythm: Normal rate and regular rhythm.     Heart sounds: Normal heart sounds.  Pulmonary:     Effort: Pulmonary effort is normal. No respiratory distress.     Breath sounds: Normal breath sounds.  Abdominal:     General: Abdomen is flat. Bowel sounds are normal.     Palpations: Abdomen is soft.     Tenderness: There is no abdominal tenderness.  Neurological:     Mental Status: She is alert and oriented to person, place, and time.     Deep Tendon Reflexes: Abnormal reflex: litis.  Psychiatric:        Mood and Affect:  Mood normal.        Behavior: Behavior normal.         Lab Results  Component Value Date   WBC 8.4 09/28/2023   HGB 13.4 09/28/2023   HCT 41.3 09/28/2023   PLT 226 09/28/2023   GLUCOSE 109 (H) 09/28/2023   CHOL 241 (H) 08/23/2023   TRIG 122 08/23/2023   HDL 61 08/23/2023   LDLCALC 158 (H) 08/23/2023   ALT 8 09/28/2023   AST 14 (L) 09/28/2023   NA 137 09/28/2023   K 3.9 09/28/2023   CL 102 09/28/2023   CREATININE 1.03 (H) 09/28/2023   BUN 23 09/28/2023   CO2 22 09/28/2023   TSH 0.966 08/23/2023   HGBA1C 5.1 01/16/2024    Results for orders placed or performed in visit on 01/16/24  POCT glycosylated hemoglobin (Hb A1C)   Collection Time: 01/16/24  3:02 PM  Result Value Ref Range   Hemoglobin A1C     HbA1c POC (<> result, manual entry) 5.1 4.0 - 5.6 %   HbA1c, POC (prediabetic range)     HbA1c, POC (controlled diabetic range)    POCT Lipid Panel   Collection Time: 01/16/24  3:05 PM  Result Value Ref Range   TC 127    HDL 39    TRG 135    LDL 61    Non-HDL 88    TC/HDL 1.6   .  Assessment & Plan:   Assessment & Plan Lobar pneumonia Resolved. Exam is excellent.  Completed antibiotics.     Colitis resolved    Abnormal  abdominal CT scan Thickened area of colon on ct scan concerning for possible cancer.  Referred to GI few days ago.  Change to urgent and our referral coordinator called and asked if Dr. Avram could review her chart and get her in any sooner.       Body mass index is 29.94 kg/m.  Assessment and Plan Assessment & Plan Colonic wall thickening, possible colitis or neoplasm Colonic wall thickening with differential diagnosis of colitis and neoplasm. Recent hospitalization for colitis. No significant hemoglobin drop, reducing colon cancer likelihood, but still possible. - Refer to gastroenterology for evaluation and colonoscopy.  Recording duration: 41 minutes     No orders of the defined types were placed in this encounter.   No orders of the defined types were placed in this encounter.      Follow-up: Return in about 3 months (around 04/27/2024) for chronic follow up.  An After Visit Summary was printed and given to the patient.  Abigail Free, MD [facility] [phone]

## 2024-01-26 NOTE — Telephone Encounter (Signed)
 Good afternoon Dr. Avram,   A call was received from Dr. Sherre office stating a urgent referral was sent over specifically over to you for patient to be seen for abnormal CT results that showed a colon lesion. Please review and advise further on scheduling.   Thank you

## 2024-01-27 DIAGNOSIS — J181 Lobar pneumonia, unspecified organism: Secondary | ICD-10-CM | POA: Insufficient documentation

## 2024-01-27 DIAGNOSIS — R935 Abnormal findings on diagnostic imaging of other abdominal regions, including retroperitoneum: Secondary | ICD-10-CM | POA: Insufficient documentation

## 2024-01-27 DIAGNOSIS — K529 Noninfective gastroenteritis and colitis, unspecified: Secondary | ICD-10-CM | POA: Insufficient documentation

## 2024-01-27 NOTE — Assessment & Plan Note (Signed)
 Thickened area of colon on ct scan concerning for possible cancer.  Referred to GI few days ago.  Change to urgent and our referral coordinator called and asked if Dr. Avram could review her chart and get her in any sooner.

## 2024-01-27 NOTE — Assessment & Plan Note (Signed)
 resolved

## 2024-01-27 NOTE — Assessment & Plan Note (Signed)
 Resolved. Exam is excellent.  Completed antibiotics.

## 2024-01-29 ENCOUNTER — Other Ambulatory Visit: Payer: Self-pay | Admitting: Family Medicine

## 2024-01-29 DIAGNOSIS — F419 Anxiety disorder, unspecified: Secondary | ICD-10-CM

## 2024-01-29 MED ORDER — BUSPIRONE HCL 15 MG PO TABS: 15.0000 mg | ORAL_TABLET | Freq: Two times a day (BID) | ORAL | 1 refills | Status: AC

## 2024-01-29 NOTE — Telephone Encounter (Signed)
 Sent. Dr. Sherre

## 2024-01-30 ENCOUNTER — Other Ambulatory Visit: Payer: Self-pay | Admitting: Family Medicine

## 2024-01-30 ENCOUNTER — Inpatient Hospital Stay: Admitting: Oncology

## 2024-01-31 NOTE — Discharge Summary (Signed)
 Hospital Medicine Discharge Summary   Demographics: Hailey Greene  73 y.o. 09/16/1950 MRN: [id number]    Extended Emergency Contact Information Primary Emergency Contact: Dubin,David Mobile Phone: [phone] Relation: Spouse  Full Code  Admit Date: 01/27/2024                            Attending Physician: Zoaib Safdar Rasool, DO Discharge Date: 01/31/2024  Primary Care Provider: Abigail Justino Free, MD   [phone]  Consults during this admission: Consult Orders             IP CONSULT TO GASTROENTEROLOGY       Specialty:  Gastroenterology  Provider:  (Not yet assigned)      IP CONSULT TO HOSPITALIST       Provider:  (Not yet assigned)              Active & Resolved Diagnosis: Principal Problem:   Colitis Active Problems:   Primary hypertension   COPD (chronic obstructive pulmonary disease)   Non-insulin  dependent type 2 diabetes mellitus    (CMD)   Anxiety and depression Resolved Problems:   Hypokalemia   Disposition: Patient discharged to Home in stable condition.   Discharge follow-up recommendations : See Hospital Course    Hospital Course: HPI from admission Hailey Greene is a 73 y.o. female with a past medical history including, but not limited to, hypertension, COPD, diabetes, hyperlipidemia, anxiety/depression, breast cancer, abnormal CT scan of the abdomen with thickened colon.  Patient was discharged from the hospital approximately 1 week ago following an admission for shortness of breath with nausea, vomiting and diarrhea.  Imaging at that time showed atypical focal thickening of the transverse colon with follow-up colonoscopy recommended.  Review of medical records indicates patient has overbook appointment with GI on November 13.  Patient comes to the emergency department today with report of new onset of diarrhea.  Symptoms began 2 days prior and she is having 3-4 episodes of bowel movements per day.  She reports no blood nor melena.   Patient took Pepto-Bismol and Imodium prior to coming to the hospital with some improvement in her symptoms.  Evaluation in emergency department demonstrated leukocytosis.  Repeat CT scan demonstrated thickening of the colon which could reflect colitis given patient's symptomatology although underlying neoplasm cannot be excluded.  Patient reports no tobacco nor alcohol use.   Patient was admitted to the hospital and had a colonoscopy with multiple polypectomy.  GI recommended continuing workup for chronic diarrhea in the outpatient setting.  Continue Augmentin  to complete 7-day therapy.  Continue Imodium.  Pathology results discussed with patient.  Microscopic colitis ruled out based on pathology reports.  This time we will recommend following up with PCP in 1 week.  C. difficile GI panel negative.  At this time we will hold Ozempic  at discharge.  This could be causing her diarrhea symptoms.  Continue to monitor patient's symptoms.  Patient's other chronic comorbid medical conditions remained stable during the course of hospitalization.     Wound / Incision Assessment: Refer to Chart Review and Media Tab for images if available.      Temp:  [35.6 F (2 C)-98.2 F (36.8 C)] 98.2 F (36.8 C) Heart Rate:  [73-92] 82 Resp:  [15-22] 18 BP: (105-127)/(59-94) 127/81 Physical Exam GEN: NAD, pleasant white female lying in bed EYES: EOMI ENT: MMM CV: RRR PULM: CTA B ABD: soft, NT/ND, +BS EXT: No edema. No erythema  Psych:-Normal Mood, Normal affect, intact judement Neuro: AO x3 MSK: Normal ROM      Discharge Medications     PAUSE taking these medications      Sig Disp Refill Start End  Ozempic  2 mg/dose (8 mg/3 mL) subcutaneous pen injector Wait to take this until: February 13, 2024 Generic drug: semaglutide   Inject 2 mg under the skin every Saturday.   0         New Medications      Sig Disp Refill Start End  amoxicillin -pot clavulanate 875-125 mg per tablet Commonly known  as: AUGMENTIN   Take 1 tablet by mouth 2 (two) times a day for 3 days.  6 tablet  0     loperamide 2 mg capsule Commonly known as: IMODIUM  Take 1 capsule (2 mg total) by mouth 4 (four) times a day as needed for diarrhea.  30 capsule  0         Medications To Continue      Sig Disp Refill Start End  acetaminophen  500 mg tablet Commonly known as: TYLENOL   Take 500 mg by mouth every 6 (six) hours as needed.   0     Airsupra  90-80 mcg/actuation [facility] Generic drug: albuterol -budesonide   Inhale 2 puffs every 4 (four) hours as needed.   0     amitriptyline  25 mg tablet Commonly known as: ELAVIL   Take 25 mg by mouth at bedtime.   0     anastrozole  1 mg tablet Commonly known as: ARIMIDEX   Take 1 mg by mouth daily.   0     budesonide -formoteroL  160-4.5 mcg/actuation inhaler Commonly known as: SYMBICORT ;BREYNA   Inhale 2 puffs 2 (two) times a day.   0     busPIRone  15 mg tablet Commonly known as: BUSPAR   Take 15 mg by mouth daily.   0     dicyclomine  20 mg Tab Commonly known as: Bentyl   Take 20 mg by mouth 3 (three) times a day as needed.   0     eszopiclone  3 mg tablet Commonly known as: LUNESTA   Take 3 mg by mouth nightly as needed.   0     ezetimibe  10 mg tablet Commonly known as: ZETIA   Take 10 mg by mouth daily.   0     fluticasone  propionate 50 mcg/spray nasal spray Commonly known as: FLONASE   Administer 2 sprays into each nostril daily as needed.   0     hydrOXYzine  pamoate 25 mg capsule Commonly known as: VISTARIL   Take 25 mg by mouth nightly as needed.   0     loratadine  10 mg tablet Commonly known as: CLARITIN   Take 10 mg by mouth daily.   0     mirtazapine  15 mg tablet Commonly known as: REMERON   Take 15 mg by mouth at bedtime.   0     montelukast 10 mg tablet Commonly known as: SINGULAIR  Take 10 mg by mouth at bedtime.   0     multivitamin Tab tablet Commonly known as: THERAGRAN  Take 1 tablet by mouth daily.   0      omeprazole  20 mg Tbec  Take 20 mg by mouth in the morning.   0     ondansetron  4 mg disintegrating tablet Commonly known as: ZOFRAN -ODT  Dissolve 4 mg on tongue every 6 (six) hours as needed for nausea or vomiting.   0     OneTouch Delica Plus Lancet 33 gauge Misc Generic drug: lancets  0     OneTouch Verio test strips test strip Generic drug: glucose blood    0     polyethylene glycol 17 gram Powd powder Commonly known as: MIRALAX  Mix 1 capful (17 g) in 4-8 ounces of liquid and take by mouth daily as needed for constipation. (Take 17 g by mouth daily as needed for constipation.)  238 g  0     pregabalin  300 mg capsule Commonly known as: LYRICA   Take 300 mg by mouth 2 (two) times a day.   0     ramipriL  2.5 mg capsule Commonly known as: ALTACE   Take 2.5 mg by mouth daily.   0     rOPINIRole  3 mg tablet Commonly known as: REQUIP   Take 3 mg by mouth at bedtime.   0     rosuvastatin  40 mg tablet Commonly known as: CRESTOR   Take 40 mg by mouth daily.   0     solifenacin  5 mg tablet Commonly known as: VESICARE   Take 5 mg by mouth daily.   0     topiramate  50 mg tablet Commonly known as: TOPAMAX   Take 50 mg by mouth daily.   0     traZODone  50 mg tablet Commonly known as: DESYREL   Take 50 mg by mouth at bedtime.   0     vortioxetine  20 mg Tab tablet Commonly known as: TRINTELLIX   Take 20 mg by mouth daily.   0             Lab Results  Component Value Date/Time   HGB 11.4 (L) 01/30/2024 09:19 AM   HCT 33.5 (L) 01/30/2024 09:19 AM   WBC 6.56 01/30/2024 09:19 AM   PLT 188 01/30/2024 09:19 AM   Lab Results  Component Value Date/Time   NA 142 01/30/2024 09:19 AM   K 3.8 01/30/2024 09:19 AM   CREATININE 0.87 01/30/2024 09:19 AM   BUN 9 01/30/2024 09:19 AM   GLUCOSE 105 (H) 01/30/2024 09:19 AM    Pertinent Imaging: CT Abdomen Pelvis W Contrast  Final Result by Ozell Ray Essex, MD [phone]/03 0936)  CT ABDOMEN PELVIS W CONTRAST,  01/27/2024 4:39 PM    INDICATION: abdominal pain, diarrhea   COMPARISON: CT 01/18/2024.    TECHNIQUE: CT images of the abdomen and pelvis were obtained after   intravenous administration of iodinated contrast. Conventional axial   reconstructions and multiplanar reformatted images were submitted for   review.     FINDINGS:    . Lower Chest: See dedicated CT PE.    SABRA Liver: Scattered calcified granulomata. Subcentimeter hypoattenuating   lesions that are too small to characterize.  . Gallbladder/Biliary: Cholecystectomy.  SABRA Spleen: Calcified granulomata.  . Pancreas: Unremarkable.  . Adrenals: Unremarkable.  . Kidneys: Right renal cyst and additional subcentimeter hypoattenuating   lesions that are too small to characterize.    SABRA Peritoneum/Mesenteries/Extraperitoneum: No free air. No free fluid or   loculated drainable collection. No pathologically enlarged lymph nodes.  . Gastrointestinal tract: No evidence of obstruction. Periampullary   duodenal diverticulum. Colonic diverticulosis without evidence of   diverticulitis. Thickening of the colon, most pronounced at the hepatic   flexure, as well as the ascending and transverse colon.    . Ureters: Unremarkable.  . Bladder: Thickening of the urinary bladder wall.  . Reproductive System: Unremarkable.    . Vascular: Aortobiiliac atherosclerosis without evidence of aneurysm.  . Musculoskeletal: Polyarticular degenerative changes including multilevel   degenerative changes of the imaged  spine. No aggressive focal bony   lesions. Similar ventral abdominal wall fat-containing hernias.    IMPRESSION:  1.  Thickening of the colon, as described. Findings could reflect colitis   given the patient's symptomatology. Again, underlying neoplasm is not   excluded. Attention on follow-up and future consideration for colonoscopy   after resolution of acute symptoms.  2.  Thickening of the urinary bladder wall. Correlate with urinalysis for    cystitis.  3.  Additional chronic/ancillary findings as detailed in the body of the   report.    CT Angio Chest Pulmonary Embolism  Final Result by Mikal Ephriam Formica, MD (11/02 9096)  CT ANGIO CHEST PULMONARY EMBOLISM, 01/27/2024 4:39 PM    INDICATION: shortness of breath   COMPARISON: CT 01/18/2024    TECHNIQUE: Iodinated contrast was administered intravenously by rapid   injection, with further multislice axial sections acquired in the   pulmonary arterial phase from the thoracic inlet to the upper abdomen.   Coronal maximal intensity projection images were created for comprehensive   analysis and diagnosis of the regional circulation.    All CT scans at [facility] and [facility]   Imaging are performed using radiation dose optimization techniques as   appropriate to a performed exam, including but not limited to one or more   of the following: automatic exposure control, adjustment of the mA and/or   kV according to patient size, use of iterative reconstruction technique.   In addition, our institution participates in a radiation dose monitoring   program to optimize patient radiation exposure.    FINDINGS:     Pulmonary arteries: No pulmonary emboli identified.    Aorta/great vessels: No aneurysm.    Thoracic inlet/central airways: Thyroid  normal. Airway patent.    Mediastinum/hila/axilla: No adenopathy. Likely reactive however enlarged   subcarinal lymph nodes.    Heart: Normal heart size. No pericardial effusion.    Lungs/pleura: No pulmonary parenchymal disease, pleural effusion, or   pneumothorax. Dependent bilateral pulmonary opacities, favored   subsegmental atelectasis/scarring. No substantial progression of/residual   infectious/inflammatory changes in the right upper lobe.    Upper abdomen: See contemporaneous CT abdomen pelvis.    Chest wall/MSK: Within normal limits.    IMPRESSION:  1.  No evidence of pulmonary  thromboembolic disease.  2.  Otherwise, no definite acute cardiopulmonary abnormality.  3.  Bibasilar pulmonary opacities, favored subsegmental atelectasis.    XR Chest 2 Views  Final Result by Eulas Carlota Dauer, MD (11/01 1703)  XR CHEST 2 VIEWS, 01/27/2024 2:31 PM    INDICATION: Shortness of Breath   COMPARISON: A scout image of 01/18/2024 CT CAP    FINDINGS:     Cardiovascular: Cardiac silhouette and pulmonary vasculature are within   normal limits.  Mediastinum: Within normal limits.  Lungs/pleura: No focal consolidation, pleural effusion, pneumothorax.  Upper abdomen: Visualized portions are unremarkable.   Chest wall/osseous structures: Polyarticular degenerative changes.      IMPRESSION:  There is no evidence of acute cardiac or pulmonary abnormality.        Electronically signed by: Zoaib Safdar Rasool, DO 01/31/2024 10:03 AM   Time spent on discharge: 48 minutes

## 2024-02-01 ENCOUNTER — Other Ambulatory Visit

## 2024-02-01 ENCOUNTER — Telehealth: Payer: Self-pay

## 2024-02-01 NOTE — Patient Instructions (Signed)
 Visit Information  Thank you for taking time to visit with me today. Please don't hesitate to contact me if I can be of assistance to you before our next scheduled telephone appointment.  Our next appointment is by telephone on 01/28/24 in the morning  Following is a copy of your care plan:   Goals Addressed             This Visit's Progress    VBCI Transitions of Care (TOC) Care Plan       Problems:  Recent Hospitalization for treatment of  Colitis SDOH barrier: Patient reports food insecurity and states her rent is going up the first of the year 2026 - referral is already in place and BSW is scheduled to call patient today at 1pm Patient is going to mention the rent concern to see if there are any resources available in addition to addressing the food issue - The following message was sent to Thersia Hoar: MRN: [id number] You have a call scheduled for today re: food insecurity - she just discharged yesterday from another admission and told me her rent is going up the first of the year and plans to ask you if there might be any resources for this as well.  Goal:  Over the next 30 days, the patient will not experience hospital readmission  Interventions:  Transitions of Care: Doctor Visits  - discussed the importance of doctor visits Communication with Dr Sherre re: Medications including clinical review needed with Trazodone , Amitriptyline , Mirtazapine  - given okay - and Aspirin  is not on d/c summary - patient to hold - as well as GI referral and SDOH with BSW follow up Arranged PCP follow-up within 12-14 days (Care Guide Scheduled)  Diabetes Interventions: Assessed patient's understanding of A1c goal: <6.5% Provided education to patient about basic DM disease process Reviewed medications with patient and discussed importance of medication adherence Discussed plans with patient for ongoing care management follow up and provided patient with direct contact information for care management  team Lab Results  Component Value Date   HGBA1C 5.1 01/16/2024    Falls Interventions: Advised patient of importance of notifying provider of falls Assessed for falls since last encounter Assessed patients knowledge of fall risk prevention secondary to previously provided education Assessed social determinant of health barriers   SDOH Barriers Patient interviewed and SDOH assessment performed        SDOH Interventions    Flowsheet Row Telephone from 02/01/2024 in [place] [facility] DEPARTMENT Patient Outreach Telephone from 01/23/2024 in [place] POP[facility] DEPARTMENT Telephone from 01/22/2024 in [place] POPULATION HEALTH DEPARTMENT Patient Outreach Telephone from 12/27/2023 in [place] POP[facility]ATION HEALTH DEPARTMENT Patient Outreach Telephone from 12/26/2023 in [place] POP[facility]ATION HEALTH DEPARTMENT Patient Outreach Telephone from 11/30/2023 in [place] POPULATION HEALTH DEPARTMENT  SDOH Interventions        Food Insecurity Interventions Other (Comment)  [Referral already in place and BSW is scheduled to talk with patient today 1pm] Community Resources Provided Intervention Not Indicated AMB Referral, Community Resources Provided, [employer] Referral AMB Referral, [employer] Resources Provided, [place] Resources Referral Intervention Not Indicated  Housing Interventions Other (Comment)  [Patient has referral in place with BSW to reach out today re: food insecurity - patient voiced today that her rent is going up and she will discuss with BSW today] -- Intervention Not Indicated Intervention Not Indicated -- Intervention Not Indicated  Transportation Interventions Intervention Not Indicated -- Intervention Not Indicated Intervention Not Indicated -- --  Utilities Interventions Intervention Not Indicated -- Intervention Not Indicated Intervention Not Indicated -- Intervention Not Indicated  Depression Interventions/Treatment  -- -- -- --  Currently on Treatment  [medication and connected with LCSW] --    Patient Self Care Activities:  Attend all scheduled provider appointments Call pharmacy for medication refills 3-7 days in advance of running out of medications Call provider office for new concerns or questions  Notify RN Care Manager of TOC call rescheduling needs Participate in Transition of Care Program/Attend TOC scheduled calls Take medications as prescribed   check feet daily for cuts, sores or redness take the blood sugar log to all doctor visits wash and dry feet carefully every day wear comfortable, cotton socks wear comfortable, well-fitting shoes Notify provider with return of diarrhea   Plan:  Telephone follow up appointment with care management team member scheduled for:  02/07/24 in the morning The patient has been provided with contact information for the care management team and has been advised to call with any health related questions or concerns.         Patient verbalizes understanding of instructions and care plan provided today and agrees to view in MyChart. Active MyChart status and patient understanding of how to access instructions and care plan via MyChart confirmed with patient.     Telephone follow up appointment with care management team member scheduled for: 01/28/24 The patient has been provided with contact information for the care management team and has been advised to call with any health related questions or concerns.   Please call the care guide team at [phone] if you need to cancel or reschedule your appointment.   Please call the Suicide and Crisis Lifeline: 988 call 1-800-273-TALK (toll free, 24 hour hotline) call 911 if you are experiencing a Mental Health or Behavioral Health Crisis or need someone to talk to.  Shona Prow RN, CCM [place]  VBCI-Population Health RN Care Manager [phone]

## 2024-02-01 NOTE — Transitions of Care (Post Inpatient/ED Visit) (Signed)
 02/01/2024  Name: Hailey Greene MRN: [id number] DOB: 19-Oct-1950  Today's TOC FU Call Status: Today's TOC FU Call Status:: Successful TOC FU Call Completed TOC FU Call Complete Date: 02/01/24 Patient's Name and Date of Birth confirmed.  Transition Care Management Follow-up Telephone Call Date of Discharge: 01/31/24 Discharge Facility: Other (Non-[facility]) Type of Discharge: Inpatient Admission How have you been since you were released from the hospital?: Better (patient states I'm feeling better than I have in a year) Any questions or concerns?: No  Items Reviewed: Did you receive and understand the discharge instructions provided?: Yes Medications obtained,verified, and reconciled?: Yes (Medications Reviewed) Any new allergies since your discharge?: No Dietary orders reviewed?: NA Do you have support at home?: Yes People in Home [RPT]: spouse, child(ren), adult Name of Support/Comfort Primary Source: husband, Alm and son  Medications Reviewed Today: Medications Reviewed Today     Reviewed by Lauro Shona DELENA, RN (Registered Nurse) on 02/01/24 at [phone]  Med List Status: <None>   Medication Order Taking? Sig Documenting Provider Last Dose Status Informant  acetaminophen  (TYLENOL ) 500 MG tablet [id number] Yes Take 500 mg by mouth.  Patient taking differently: Take 500 mg by mouth every 6 (six) hours as needed for mild pain (pain score 1-3).   [provider]  Active   Albuterol -Budesonide  (AIRSUPRA ) 90-80 MCG/ACT AERO [id number] Yes Inhale 2 puffs into the lungs 4 (four) times daily as needed.  Patient taking differently: Inhale 2 puffs into the lungs every 4 (four) hours as needed (shortness/wheezing).   Cox, Kirsten, MD  Active   amitriptyline  (ELAVIL ) 25 MG tablet [id number] Yes TAKE 1 TABLET BY MOUTH EVERY EVENING Cox, Kirsten, MD  Active   amoxicillin -clavulanate (AUGMENTIN ) 875-125 MG tablet [id number] Yes Take 1 tablet by mouth 2 (two) times daily.  [provider]  Active   anastrozole  (ARIMIDEX ) 1 MG tablet [id number] Yes Take 1 tablet (1 mg total) by mouth daily. Cornelius Wanda DEL, MD  Active   ASPIRIN  81 PO [id number]  Take 81 mg by mouth daily.  Patient not taking: Reported on 02/01/2024   [provider]  Active Self, Pharmacy Records, Spouse/Significant Other  Blood Glucose Monitoring Suppl DEVI [id number] Yes 1 each by Does not apply route daily. E11.9 May substitute to any manufacturer covered by patient's insurance. Sherre Clapper, MD  Active Self, Pharmacy Records, Spouse/Significant Other  Blood Glucose Monitoring Suppl DEVI [id number] Yes 1 each by Does not apply route in the morning, at noon, and at bedtime. May substitute to any manufacturer covered by patient's insurance. CoxClapper, MD  Active Self, Pharmacy Records, Spouse/Significant Other  budesonide -formoterol  (SYMBICORT ) 160-4.5 MCG/ACT inhaler [id number] Yes Inhale 2 puffs into the lungs 2 (two) times daily. [provider]  Active   busPIRone  (BUSPAR ) 15 MG tablet [id number] Yes Take 1 tablet (15 mg total) by mouth 2 (two) times daily. Cox, Kirsten, MD  Active   carbamide peroxide (DEBROX) 6.5 % OTIC solution [id number]  Place 5 drops into the left ear 2 (two) times daily.  Patient not taking: Reported on 02/01/2024   Teressa Harrie HERO, FNP  Active Self, Pharmacy Records, Spouse/Significant Other  Cyanocobalamin  (VITAMIN B-12) 2500 MCG SUBL [id number]  Place 1 tablet (2,500 mcg total) under the tongue daily. DISSOLVE 1 TABLET UNDER THE TONGUE DAILY  Patient not taking: Reported on 02/01/2024   Sherre Clapper, MD  Active Self, Pharmacy Records, Spouse/Significant Other  dicyclomine  (BENTYL ) 20 MG tablet [id number] Yes TAKE 1 TABLET (20 MG TOTAL) BY MOUTH  4 (FOUR) TIMES DAILY AS NEEDED (INTESTINAL SPASMS). Sherre Clapper, MD  Active   Eszopiclone  3 MG TABS [id number] Yes Take 3 mg by mouth at bedtime. Take immediately before bedtime [provider]   Active   ezetimibe  (ZETIA ) 10 MG tablet [id number] Yes TAKE 1 TABLET BY MOUTH EVERY DAY Cox, Kirsten, MD  Active   fluticasone  (FLONASE ) 50 MCG/ACT nasal spray [id number] Yes SPRAY 2 SPRAYS INTO EACH NOSTRIL EVERY DAY Cox, Kirsten, MD  Active Self, Pharmacy Records, Spouse/Significant Other  glucose blood (ONETOUCH VERIO) test strip [id number] Yes E11.42 Use new test strip each time when checking FBS Cox, Kirsten, MD  Active Self, Pharmacy Records, Spouse/Significant Other  hydrOXYzine  (VISTARIL ) 25 MG capsule [id number] Yes Take 1 capsule (25 mg total) by mouth at bedtime as needed. Teressa Harrie HERO, FNP  Active   Lancets [facility] DELICA PLUS [place]) MISC [id number] Yes E 11.42 Use new lancet each time when checking FBS Cox, Kirsten, MD  Active Self, Pharmacy Records, Spouse/Significant Other  loperamide (IMODIUM) 2 MG capsule [id number] Yes Take 2 mg by mouth as needed for diarrhea or loose stools (4 times a day as needed for diarrhea). [provider]  Active   loratadine  (CLARITIN ) 10 MG tablet [id number] Yes Take 1 tablet (10 mg total) by mouth daily. CoxClapper, MD  Active Self, Pharmacy Records, Spouse/Significant Other  mirtazapine  (REMERON ) 15 MG tablet [id number] Yes Take 15 mg by mouth at bedtime. [provider]  Active   montelukast (SINGULAIR) 10 MG tablet [id number] Yes Take 10 mg by mouth at bedtime. [provider]  Active   Multiple Vitamin (MULTIVITAMIN WITH MINERALS) TABS tablet [id number] Yes Take 1 tablet by mouth daily. [provider]  Active Spouse/Significant Other, Self, Pharmacy Records  omeprazole  (PRILOSEC) 20 MG capsule [id number] Yes TAKE 1 CAPSULE BY MOUTH EVERY DAY Cox, Kirsten, MD  Active   ondansetron  (ZOFRAN -ODT) 4 MG disintegrating tablet [id number] Yes Take 4 mg by mouth every 6 (six) hours as needed for nausea or vomiting. [provider]  Active Self, Pharmacy Records, Spouse/Significant Other  polyethylene glycol (MIRALAX /  GLYCOLAX) 17 g packet [id number] Yes Take 17 g by mouth daily as needed for mild constipation. [provider]  Active   pregabalin  (LYRICA ) 300 MG capsule [id number] Yes Take 1 capsule (300 mg total) by mouth 2 (two) times daily. CoxClapper, MD  Active Self, Pharmacy Records, Spouse/Significant Other  ramipril  (ALTACE ) 2.5 MG capsule [id number] Yes TAKE 1 CAPSULE BY MOUTH EVERY DAY Cox, Kirsten, MD  Active   rOPINIRole  (REQUIP ) 3 MG tablet [id number] Yes Take 1 tablet (3 mg total) by mouth daily. Milon Cleaves, PA  Active   rosuvastatin  (CRESTOR ) 40 MG tablet [id number] Yes Take 1 tablet (40 mg total) by mouth daily. Milon Cleaves, PA  Active   Semaglutide , 2 MG/DOSE, (OZEMPIC , 2 MG/DOSE,) 8 MG/3ML SOPN [id number]  INJECT 2 MG AS DIRECTED ONCE A WEEK.  Patient not taking: Reported on 02/01/2024   CoxClapper, MD  Active   solifenacin  (VESICARE ) 5 MG tablet [id number] Yes Take 1 tablet (5 mg total) by mouth daily. Milon Cleaves, PA  Active   topiramate  (TOPAMAX ) 50 MG tablet [id number] Yes Take 1 tablet (50 mg total) by mouth daily. Milon Cleaves, PA  Active   traZODone  (DESYREL ) 50 MG tablet [id number] Yes Take 1 tablet (50 mg total) by mouth at bedtime. Cox, Kirsten, MD  Active   vortioxetine  HBr (TRINTELLIX ) 20 MG TABS tablet [id number] Yes Take 1 tablet (  20 mg total) by mouth daily. Milon Cleaves, PA  Active             Home Care and Equipment/Supplies: Were Home Health Services Ordered?: No Any new equipment or medical supplies ordered?: No  Functional Questionnaire: Do you need assistance with bathing/showering or dressing?: No Do you need assistance with meal preparation?: No Do you need assistance with eating?: No Do you have difficulty maintaining continence: No Do you need assistance with getting out of bed/getting out of a chair/moving?: No Do you have difficulty managing or taking your medications?: No  Follow up appointments reviewed: PCP Follow-up appointment confirmed?:  Yes Date of PCP follow-up appointment?: 02/12/24 Follow-up Provider: Dr [facility] Follow-up appointment confirmed?: NA (Secure chat message sent to Dr Sherre at [facility] request to ask for GI referral to Dr Avram) Do you need transportation to your follow-up appointment?: No Do you understand care options if your condition(s) worsen?: Yes-patient verbalized understanding  SDOH Interventions Today    Flowsheet Row Most Recent Value  SDOH Interventions   Food Insecurity Interventions Other (Comment)  [Referral already in place and BSW is scheduled to talk with patient today 1pm]  Housing Interventions Other (Comment)  [Patient has referral in place with BSW to reach out today re: food insecurity - patient voiced today that her rent is going up and she will discuss with BSW today]  Transportation Interventions Intervention Not Indicated  Utilities Interventions Intervention Not Indicated    Goals Addressed             This Visit's Progress    VBCI Transitions of Care (TOC) Care Plan       Problems:  Recent Hospitalization for treatment of  Colitis SDOH barrier: Patient reports food insecurity and states her rent is going up the first of the year 2026 - referral is already in place and BSW is scheduled to call patient today at 1pm Patient is going to mention the rent concern to see if there are any resources available in addition to addressing the food issue - The following message was sent to Thersia Hoar: MRN: [id number] You have a call scheduled for today re: food insecurity - she just discharged yesterday from another admission and told me her rent is going up the first of the year and plans to ask you if there might be any resources for this as well.  Goal:  Over the next 30 days, the patient will not experience hospital readmission  Interventions:  Transitions of Care: Doctor Visits  - discussed the importance of doctor visits Communication with Dr Sherre re:  Medications including clinical review needed with Trazodone , Amitriptyline , Mirtazapine  - given okay - and Aspirin  is not on d/c summary - patient to hold - as well as GI referral and SDOH with BSW follow up Arranged PCP follow-up within 12-14 days (Care Guide Scheduled)  Diabetes Interventions: Assessed patient's understanding of A1c goal: <6.5% Provided education to patient about basic DM disease process Reviewed medications with patient and discussed importance of medication adherence Discussed plans with patient for ongoing care management follow up and provided patient with direct contact information for care management team Lab Results  Component Value Date   HGBA1C 5.1 01/16/2024    Falls Interventions: Advised patient of importance of notifying provider of falls Assessed for falls since last encounter Assessed patients knowledge of fall risk prevention secondary to previously provided education Assessed social determinant of health barriers   SDOH Barriers Patient interviewed and SDOH  assessment performed        SDOH Interventions    Flowsheet Row Telephone from 02/01/2024 in [place] POPULATION HEALTH DEPARTMENT Patient Outreach Telephone from 01/23/2024 in [place] POPULATION HEALTH DEPARTMENT Telephone from 01/22/2024 in [place] POPULATION HEALTH DEPARTMENT Patient Outreach Telephone from 12/27/2023 in [place] POPULATION HEALTH DEPARTMENT Patient Outreach Telephone from 12/26/2023 in [place] POPULATION HEALTH DEPARTMENT Patient Outreach Telephone from 11/30/2023 in [place] POPULATION HEALTH DEPARTMENT  SDOH Interventions        Food Insecurity Interventions Other (Comment)  [Referral already in place and BSW is scheduled to talk with patient today 1pm] Community Resources Provided Intervention Not Indicated AMB Referral, Community Resources Provided, [employer] Referral AMB Referral, [employer] Provided, [employer]  Referral Intervention Not Indicated  Housing Interventions Other (Comment)  [Patient has referral in place with BSW to reach out today re: food insecurity - patient voiced today that her rent is going up and she will discuss with BSW today] -- Intervention Not Indicated Intervention Not Indicated -- Intervention Not Indicated  Transportation Interventions Intervention Not Indicated -- Intervention Not Indicated Intervention Not Indicated -- --  Utilities Interventions Intervention Not Indicated -- Intervention Not Indicated Intervention Not Indicated -- Intervention Not Indicated  Depression Interventions/Treatment  -- -- -- -- Currently on Treatment  [medication and connected with LCSW] --    Patient Self Care Activities:  Attend all scheduled provider appointments Call pharmacy for medication refills 3-7 days in advance of running out of medications Call provider office for new concerns or questions  Notify RN Care Manager of TOC call rescheduling needs Participate in Transition of Care Program/Attend TOC scheduled calls Take medications as prescribed   check feet daily for cuts, sores or redness take the blood sugar log to all doctor visits wash and dry feet carefully every day wear comfortable, cotton socks wear comfortable, well-fitting shoes Notify provider with return of diarrhea   Plan:  Telephone follow up appointment with care management team member scheduled for:  02/07/24 in the morning The patient has been provided with contact information for the care management team and has been advised to call with any health related questions or concerns.         Shona Prow RN, CCM [place]  VBCI-Population Health RN Care Manager [phone]

## 2024-02-01 NOTE — Patient Instructions (Signed)
 Visit Information  Thank you for taking time to visit with me today. Please don't hesitate to contact me if I can be of assistance to you before our next scheduled appointment.  Your next care management appointment is by telephone on 02/16/24 at 1130   Please call the care guide team at [phone] if you need to cancel, schedule, or reschedule an appointment.   Please call the Suicide and Crisis Lifeline: 988 call the [place]  National Suicide Prevention Lifeline: 925-019-2465 or TTY: 765-614-0641 TTY (865)422-2441) to talk to a trained counselor call 1-800-273-TALK (toll free, 24 hour hotline) go to [facility] [address] [place] [phone]) call 911 if you are experiencing a Mental Health or Behavioral Health Crisis or need someone to talk to.  Thersia Hoar, [name], MHA [place]  [facility] Social Worker, Population Health [phone]

## 2024-02-01 NOTE — Patient Outreach (Signed)
 Social Drivers of Health  Community Resource and Care Coordination Visit Note   02/01/2024  Name: Hailey Greene MRN: [id number] DOB:27-Mar-1951  Situation: Referral received for [facility] needs assessment and assistance related to Food Insecurity . I obtained verbal consent from Patient.  Visit completed with Patient on the phone.   Background:      Assessment:   Goals Addressed             This Visit's Progress    BSW VBCI Social Work Care Plan       Problems:   [employer] Insecurity   CSW Clinical Goal(s):   Over the next 3 weeks the Patient will will follow up with the resources for the food pantry and rental assistance as directed by Social Work.  Interventions:  SW will resend food and rental resources  Patient Goals/Self-Care Activities:  Review resources mailed  Plan:   The care management team will reach out to the patient again over the next 15 days.        Recommendation:   call and/or follow up with food resources for food assistance  Follow Up Plan:   Telephone follow-up 02/16/24 at 1130  Thersia Hoar, BSW, [place]  [facility] Social Worker, Population Health [phone]

## 2024-02-06 ENCOUNTER — Encounter: Payer: Self-pay | Admitting: Oncology

## 2024-02-06 ENCOUNTER — Telehealth: Payer: Self-pay | Admitting: Oncology

## 2024-02-06 ENCOUNTER — Other Ambulatory Visit: Payer: Self-pay | Admitting: Oncology

## 2024-02-06 ENCOUNTER — Inpatient Hospital Stay: Attending: Oncology | Admitting: Oncology

## 2024-02-06 VITALS — BP 146/82 | HR 87 | Temp 98.4°F | Ht 63.0 in | Wt 168.4 lb

## 2024-02-06 DIAGNOSIS — C50411 Malignant neoplasm of upper-outer quadrant of right female breast: Secondary | ICD-10-CM

## 2024-02-06 DIAGNOSIS — Z79811 Long term (current) use of aromatase inhibitors: Secondary | ICD-10-CM | POA: Diagnosis not present

## 2024-02-06 DIAGNOSIS — Z170 Estrogen receptor positive status [ER+]: Secondary | ICD-10-CM | POA: Diagnosis not present

## 2024-02-06 DIAGNOSIS — Z87891 Personal history of nicotine dependence: Secondary | ICD-10-CM | POA: Insufficient documentation

## 2024-02-06 DIAGNOSIS — Z1731 Human epidermal growth factor receptor 2 positive status: Secondary | ICD-10-CM | POA: Insufficient documentation

## 2024-02-06 DIAGNOSIS — Z1721 Progesterone receptor positive status: Secondary | ICD-10-CM | POA: Diagnosis not present

## 2024-02-06 MED ORDER — ANASTROZOLE 1 MG PO TABS: 1.0000 mg | ORAL_TABLET | Freq: Every day | ORAL | 5 refills | Status: AC

## 2024-02-06 NOTE — Telephone Encounter (Signed)
 Patient has been scheduled for follow-up visit per 02/05/24 LOS.  Pt aware of scheduled appt details.

## 2024-02-06 NOTE — Progress Notes (Signed)
 [facility]  [address] [place],  [place]  [address] [phone]  Clinic Day: 02/06/24  Referring physician: Sherre Clapper, MD  ASSESSMENT & PLAN:  Assessment: Invasive Papillary Carcinoma of the Right Breast This was not present on her biopsy but was seen on the lumpectomy specimen with a 9 mm grade 2 lesion for a T1b N0 M0, stage IA. Her prognosis is good but I do recommend she take hormonal therapy in the form of Anastrozole  1 mg daily. She will not need chemotherapy or radiation. She is in agreement with this plan.   Colon Polyps At least one was a tubulovillous adenoma so she will probably have a repeat in 5 years.   Plan:  She informed that she was admitted into the ED and presented with shortness of breath with nausea, vomiting and diarrhea. She was found to have a bowel obstruction and was discharged but then readmitted as she presented with new onset of diarrhea. Repeat CT scan showed atypical focal thickening of the transverse colon and had a colonoscopy with multiple polypectomy. Pathology from her colonoscopy done on 01/29/2024 revealed an tubulovillous adenoma in the sigmoid colon and fragments of colonic mucosa and sessile polyps in the colon. All were negative for dysplasia. Her Ozempic  was stopped as this could be the cause of her stomach irritation. Patient informed me that she has an appointment with her PCP on 11/17. She continues Anastrozole  1 mg without difficulty and I will refill that for her today. Her annual bilateral diagnostic mammogram is scheduled on 02/13/2024 at the [facility] in [place]. She had labs done on 01/30/2024 and had a WBC of 6.56, low hemoglobin of 11.4, and platelet count of 188,000. Her CMP was also normal but had a low magnesium  of 1.8. I will see her back in 4 months for reevaluation. I discussed the assessment and treatment plan with the patient.  The patient was provided an opportunity to ask questions and all were answered.  The  patient agreed with the plan and demonstrated an understanding of the instructions.  The patient was advised to call back if the symptoms worsen or if the condition fails to improve as anticipated.  I provided 16 minutes of face-to-face time during this this encounter and > 50% was spent counseling as documented under my assessment and plan.   Hailey VEAR Cornish, MD [facility] A DEPT OF [facility] [address] [place] [address] Dept: [phone] Dept Fax: [phone]   I, Jasmine Lassiter, am acting as scribe for Hailey HILARIO Cornish, MD  I have reviewed this report as typed by the medical scribe, and it is complete and accurate.  Hailey VEAR Cornish, MD   11/21/20257:27 PM  CHIEF COMPLAINT:  CC: Invasive Papillary Carcinoma of the right breast.   Current Treatment:  Anastrozole  1 mg daily  HISTORY OF PRESENT ILLNESS:  Hailey Greene is a 73 y.o. female with a history of diabetes and asthma who is referred in consultation by Dr. Belinda for assessment and management of newly diagnosed papillary carcinoma of the right breast. This was found on a routine screening mammogram done on 02/01/2024, which showed a possible mass in the right breast. She then had a diagnostic mammogram and ultrasound of the right breast on 02/21/2023 which further confirmed an indeterminate 7 mm mass in the right breast at 10 o'clock, 7 cm from the nipple. This was biopsied and pathology revealed intraductal  papilloma with columnar cell changes and usual ductal hyperplasia. She then had a lumpectomy and final pathology revealed a 0.9 cm, invasive papillary carcinoma, grade 2 that had clear margins for a T1b N0 M0, stage IA. This was ER and PR positive at 100%, HER2 (0) negative, with a Ki67 of 2%. She had a MRI of the head done on 07/01/2023 which showed no acute intracranial abnormality or significant interval change, central pontine T2  hyperintensity without associated enhancement, small mastoid effusions bilaterally with no obstructing nasopharyngeal lesion is present, and polyp or mucous retention cyst of the left maxillary sinus. She had a bone density scan on January 26, 2023 which was normal.   I have reviewed her chart and materials related to her cancer extensively and collaborated history with the patient. Summary of oncologic history is as follows: Oncology History  Breast cancer of upper-outer quadrant of right female breast (HCC)  09/13/2023 Cancer Staging   Staging form: Breast, AJCC 8th Edition - Clinical stage from 09/13/2023: Stage IA (cT1b, cN0, cM0, G2, ER+, PR+, HER2-) - Signed by Cornelius Hailey DEL, MD on 09/28/2023 Histopathologic type: Intraductal papillary adenocarcinoma with invasion Stage prefix: Initial diagnosis Method of lymph node assessment: Clinical Multigene prognostic tests performed: None Histologic grading system: 3 grade system Laterality: Right Tumor size (mm): 9 Lymph-vascular invasion (LVI): LVI not present (absent)/not identified Diagnostic confirmation: Positive histology Specimen type: Excision Staged by: Managing physician Ki-67 (%): 2 Stage used in treatment planning: Yes National guidelines used in treatment planning: Yes Type of national guideline used in treatment planning: NCCN   09/28/2023 Initial Diagnosis   Breast cancer of upper-outer quadrant of right female breast (HCC)    INTERVAL HISTORY:  Hailey Greene is seen in the clinic for follow up of her stage IA grade 2 invasive Papillary Carcinoma of the right breast. Patient states that she feels well and has no complaints of pain. She informed that she was admitted into the ED and presented with shortness of breath with nausea, vomiting and diarrhea. She was found to have an bowel obstruction and was discharged but then readmitted as she presented with new onset of diarrhea. Repeat CT scan showed atypical focal thickening of the  transverse colon and had a colonoscopy with multiple polypectomy. Pathology from her colonoscopy done on 01/29/2024 revealed an tubulovillous adenoma in the sigmoid colon and fragments of colonic mucosa and sessile polyps in the colon. All were negative for dysplasia. Her Ozempic  was also stopped as this could be the cause of her stomach irritation. Patient informed me that she has an appointment with her PCP on 11/17. She continues Anastrozole  1 mg without difficulty and I will refill that for her today. Her annual bilateral diagnostic mammogram is scheduled on 02/13/2024 at the [facility] in [place]. She had labs done on 01/30/2024 and had a WBC of 6.56, low hemoglobin of 11.4, and platelet count of 188,000. Her CMP was also normal but had a low magnesium  of 1.8. I will see her back in 4 months for reevaluation.   She denies fever, chills, night sweats, or other signs of infection. She denies cardiorespiratory issues. She  denies pain. Her appetite is improved and Her weight has decreased 4 pounds over last 4 months. This patient is accompanied in the office by her husband Alm.   HISTORY:   Past Medical History:  Diagnosis Date   2019 novel coronavirus disease (COVID-19) 12/31/2018   Acute hypoxemic respiratory failure due to COVID-19 [facility])    Asthma  Bronchiectasis (HCC)    Chicken pox    Diabetes (HCC)    GERD (gastroesophageal reflux disease)    Hypertension    IBS (irritable bowel syndrome)    Major depressive disorder    Mixed hyperlipidemia    Neuromuscular disorder (HCC)    peripheral neuropathy   Neuropathy    Osteoarthritis    Pneumonia    x2   Primary insomnia    Primary osteoarthritis of left knee 05/31/2019   RLS (restless legs syndrome)    S/P total knee replacement 07/15/2019   Sleep apnea    does not use CPAP   Status post total knee replacement 07/25/2019   Tachycardia    Urge incontinence    UTI (lower urinary tract infection)    Weakness     Past  Surgical History:  Procedure Laterality Date   ABDOMINAL HYSTERECTOMY     APPENDECTOMY     BREAST BIOPSY Right 04/26/2023   US  RT BREAST BX W LOC DEV 1ST LESION IMG BX SPEC US  GUIDE 04/26/2023 [facility]   BREAST BIOPSY  09/12/2023   US  RT RADIOACTIVE SEED LOC 09/12/2023 [facility]   BREAST LUMPECTOMY     CHOLECYSTECTOMY     EXCISION OF BREAST BIOPSY Right 09/13/2023   Procedure: EXCISION OF BREAST BIOPSY WITH RADIOACTIVE SEED GUIDED LOCALIZATION;  Surgeon: Belinda Cough, MD;  Location: [facility] OR;  Service: General;  Laterality: Right;  Right breast rsl excisional biopsy   HERNIA REPAIR     REPLACEMENT TOTAL KNEE Right    TONSILLECTOMY     TOTAL KNEE ARTHROPLASTY Left 07/15/2019   Procedure: TOTAL KNEE ARTHROPLASTY;  Surgeon: Rubie Kemps, MD;  Location: [facility] ORS;  Service: Orthopedics;  Laterality: Left;   TUBAL LIGATION      Family History  Problem Relation Age of Onset   Thyroid  disease Mother    Cancer Mother    Migraines Mother    Brain cancer Father    Throat cancer Father    Cancer - Other Father    Heart disease Maternal Grandmother    Diabetes Daughter     Social History:  reports that she quit smoking about 13 years ago. Her smoking use included cigarettes. She started smoking about 25 years ago. She has a 24 pack-year smoking history. She has never used smokeless tobacco. She reports that she does not currently use alcohol. She reports that she does not use drugs.The patient is accompanied by her husband Alm today.  Allergies: No Known Allergies  Current Medications: Current Outpatient Medications  Medication Sig Dispense Refill   acetaminophen  (TYLENOL ) 500 MG tablet Take 500 mg by mouth. (Patient taking differently: Take 500 mg by mouth every 6 (six) hours as needed for mild pain (pain score 1-3).)     Albuterol -Budesonide  (AIRSUPRA ) 90-80 MCG/ACT AERO Inhale 2 puffs into the lungs 4 (four) times daily as needed. (Patient taking differently: Inhale 2  puffs into the lungs every 4 (four) hours as needed (shortness/wheezing).) 10.7 g 3   amitriptyline  (ELAVIL ) 25 MG tablet TAKE 1 TABLET BY MOUTH EVERY EVENING 30 tablet 5   anastrozole  (ARIMIDEX ) 1 MG tablet Take 1 tablet (1 mg total) by mouth daily. 30 tablet 5   ASPIRIN  81 PO Take 81 mg by mouth daily. (Patient not taking: Reported on 02/01/2024)     Blood Glucose Monitoring Suppl DEVI 1 each by Does not apply route daily. E11.9 May substitute to any manufacturer covered by patient's insurance. 1 each 0   Blood Glucose  Monitoring Suppl DEVI 1 each by Does not apply route in the morning, at noon, and at bedtime. May substitute to any manufacturer covered by patient's insurance. 1 each 0   budesonide -formoterol  (SYMBICORT ) 160-4.5 MCG/ACT inhaler Inhale 2 puffs into the lungs 2 (two) times daily.     busPIRone  (BUSPAR ) 15 MG tablet Take 1 tablet (15 mg total) by mouth 2 (two) times daily. 180 tablet 1   carbamide peroxide (DEBROX) 6.5 % OTIC solution Place 5 drops into the left ear 2 (two) times daily. (Patient not taking: Reported on 02/01/2024) 15 mL 0   Cyanocobalamin  (VITAMIN B-12) 2500 MCG SUBL Place 1 tablet (2,500 mcg total) under the tongue daily. DISSOLVE 1 TABLET UNDER THE TONGUE DAILY (Patient not taking: Reported on 02/01/2024) 90 tablet 3   dicyclomine  (BENTYL ) 20 MG tablet TAKE 1 TABLET (20 MG TOTAL) BY MOUTH 4 (FOUR) TIMES DAILY AS NEEDED (INTESTINAL SPASMS). 360 tablet 1   Eszopiclone  3 MG TABS Take 3 mg by mouth at bedtime. Take immediately before bedtime     ezetimibe  (ZETIA ) 10 MG tablet TAKE 1 TABLET BY MOUTH EVERY DAY 90 tablet 1   fluticasone  (FLONASE ) 50 MCG/ACT nasal spray SPRAY 2 SPRAYS INTO EACH NOSTRIL EVERY DAY 48 g 2   glucose blood (ONETOUCH VERIO) test strip E11.42 Use new test strip each time when checking FBS 100 each 2   hydrOXYzine  (VISTARIL ) 25 MG capsule Take 1 capsule (25 mg total) by mouth at bedtime as needed. 90 capsule 0   Lancets (ONETOUCH DELICA PLUS  LANCET33G) MISC E 11.42 Use new lancet each time when checking FBS 100 each 2   loperamide  (IMODIUM ) 2 MG capsule Take 2 mg by mouth as needed for diarrhea or loose stools (4 times a day as needed for diarrhea).     loratadine  (CLARITIN ) 10 MG tablet Take 1 tablet (10 mg total) by mouth daily. 90 tablet 3   mirtazapine  (REMERON ) 15 MG tablet Take 15 mg by mouth at bedtime.     montelukast (SINGULAIR) 10 MG tablet Take 10 mg by mouth at bedtime.     Multiple Vitamin (MULTIVITAMIN WITH MINERALS) TABS tablet Take 1 tablet by mouth daily.     omeprazole  (PRILOSEC) 20 MG capsule TAKE 1 CAPSULE BY MOUTH EVERY DAY 90 capsule 1   ondansetron  (ZOFRAN -ODT) 4 MG disintegrating tablet Take 4 mg by mouth every 6 (six) hours as needed for nausea or vomiting.     polyethylene glycol (MIRALAX / GLYCOLAX) 17 g packet Take 17 g by mouth daily as needed for mild constipation.     pregabalin  (LYRICA ) 300 MG capsule Take 1 capsule (300 mg total) by mouth 2 (two) times daily. 60 capsule 2   ramipril  (ALTACE ) 2.5 MG capsule TAKE 1 CAPSULE BY MOUTH EVERY DAY 90 capsule 1   rOPINIRole  (REQUIP ) 3 MG tablet Take 1 tablet (3 mg total) by mouth daily. 90 tablet 0   rosuvastatin  (CRESTOR ) 40 MG tablet Take 1 tablet (40 mg total) by mouth daily. 90 tablet 0   Semaglutide , 2 MG/DOSE, (OZEMPIC , 2 MG/DOSE,) 8 MG/3ML SOPN INJECT 2 MG AS DIRECTED ONCE A WEEK. (Patient not taking: Reported on 02/06/2024) 6 mL 2   solifenacin  (VESICARE ) 5 MG tablet Take 1 tablet (5 mg total) by mouth daily. 90 tablet 0   topiramate  (TOPAMAX ) 50 MG tablet Take 1 tablet (50 mg total) by mouth daily. 90 tablet 0   traZODone  (DESYREL ) 50 MG tablet Take 1 tablet (50 mg total) by mouth  at bedtime. 90 tablet 0   vortioxetine  HBr (TRINTELLIX ) 20 MG TABS tablet Take 1 tablet (20 mg total) by mouth daily. 90 tablet 0   No current facility-administered medications for this visit.    REVIEW OF SYSTEMS:  Review of Systems  Constitutional: Negative.  Negative  for appetite change, chills, fever and unexpected weight change.  HENT:  Negative.  Negative for lump/mass, mouth sores and sore throat.   Eyes: Negative.   Respiratory: Negative.  Negative for chest tightness, cough, hemoptysis, shortness of breath and wheezing.   Cardiovascular: Negative.  Negative for chest pain, leg swelling and palpitations.  Gastrointestinal: Negative.  Negative for abdominal distention, abdominal pain, blood in stool, constipation, diarrhea, nausea and vomiting.  Endocrine: Negative.   Genitourinary: Negative.  Negative for difficulty urinating, dysuria, frequency and hematuria.   Musculoskeletal: Negative.  Negative for arthralgias, back pain, flank pain and gait problem.  Skin: Negative.  Negative for itching, rash and wound.  Neurological:  Negative for dizziness, extremity weakness, gait problem, headaches, light-headedness, numbness, seizures and speech difficulty.  Hematological: Negative.  Negative for adenopathy. Does not bruise/bleed easily.  Psychiatric/Behavioral: Negative.  Negative for depression and sleep disturbance. The patient is not nervous/anxious.    VITALS:  Blood pressure (!) 146/82, pulse 87, temperature 98.4 F (36.9 C), temperature source Oral, height 5' 3 (1.6 m), weight 168 lb 6.4 oz (76.4 kg), SpO2 99%.  Wt Readings from Last 3 Encounters:  02/06/24 168 lb 6.4 oz (76.4 kg)  01/26/24 169 lb (76.7 kg)  01/16/24 164 lb (74.4 kg)    Body mass index is 29.83 kg/m.  Performance status (ECOG): 1 - Symptomatic but completely ambulatory  PHYSICAL EXAM:  Physical Exam Vitals and nursing note reviewed. Exam conducted with a chaperone present.  Constitutional:      General: She is not in acute distress.    Appearance: Normal appearance. She is normal weight.  HENT:     Head: Normocephalic and atraumatic.     Right Ear: Tympanic membrane, ear canal and external ear normal. There is no impacted cerumen.     Left Ear: Tympanic membrane, ear  canal and external ear normal. There is no impacted cerumen.     Nose: Nose normal. No congestion or rhinorrhea.     Mouth/Throat:     Mouth: Mucous membranes are moist.     Pharynx: Oropharynx is clear. No oropharyngeal exudate or posterior oropharyngeal erythema.  Eyes:     General: No scleral icterus.       Right eye: No discharge.        Left eye: No discharge.     Extraocular Movements: Extraocular movements intact.     Conjunctiva/sclera: Conjunctivae normal.     Pupils: Pupils are equal, round, and reactive to light.  Cardiovascular:     Rate and Rhythm: Normal rate and regular rhythm.     Pulses: Normal pulses.     Heart sounds: Normal heart sounds. No murmur heard.    No friction rub. No gallop.  Pulmonary:     Effort: Pulmonary effort is normal. No respiratory distress.     Breath sounds: Normal breath sounds.  Chest:     Comments: Well healed scar in the lateral right breast at about 9 o'clock near the axillary line.  No masses in either breasts Abdominal:     General: Bowel sounds are normal. There is no distension.     Palpations: Abdomen is soft. There is no hepatomegaly, splenomegaly or mass.  Tenderness: There is no abdominal tenderness. There is no right CVA tenderness, left CVA tenderness, guarding or rebound.     Hernia: No hernia is present.  Musculoskeletal:        General: Normal range of motion.     Cervical back: Normal range of motion and neck supple.     Right lower leg: No edema.     Left lower leg: No edema.  Lymphadenopathy:     Cervical: No cervical adenopathy.     Right cervical: No superficial, deep or posterior cervical adenopathy.    Left cervical: No superficial, deep or posterior cervical adenopathy.     Upper Body:     Right upper body: No supraclavicular, axillary or pectoral adenopathy.     Left upper body: No supraclavicular, axillary or pectoral adenopathy.  Skin:    General: Skin is warm and dry.  Neurological:     General: No  focal deficit present.     Mental Status: She is alert and oriented to person, place, and time. Mental status is at baseline.  Psychiatric:        Mood and Affect: Mood normal.        Behavior: Behavior normal.        Thought Content: Thought content normal.        Judgment: Judgment normal.    LABS:      Latest Ref Rng & Units 09/28/2023    2:26 PM 08/23/2023   11:24 AM 07/05/2023   11:21 AM  CBC  WBC 4.0 - 10.5 K/uL 8.4  7.6  9.6   Hemoglobin 12.0 - 15.0 g/dL 86.5  86.2  85.1   Hematocrit 36.0 - 46.0 % 41.3  44.7  45.7   Platelets 150 - 400 K/uL 226  233  271       Latest Ref Rng & Units 09/28/2023    2:26 PM 08/23/2023   11:24 AM 07/05/2023   11:21 AM  CMP  Glucose 70 - 99 mg/dL 890  98  92   BUN 8 - 23 mg/dL 23  17  10    Creatinine 0.44 - 1.00 mg/dL 8.96  9.20  9.30   Sodium 135 - 145 mmol/L 137  134  138   Potassium 3.5 - 5.1 mmol/L 3.9  4.9  5.2   Chloride 98 - 111 mmol/L 102  100  100   CO2 22 - 32 mmol/L 22  20  21    Calcium  8.9 - 10.3 mg/dL 9.1  9.0  9.7   Total Protein 6.5 - 8.1 g/dL 6.5  6.0  6.4   Total Bilirubin 0.0 - 1.2 mg/dL 0.3  0.4  0.4   Alkaline Phos 38 - 126 U/L 92  90  84   AST 15 - 41 U/L 14  15  26    ALT 0 - 44 U/L 8  10  20       No results found for: CEA1, CEA / No results found for: CEA1, CEA No results found for: PSA1 No results found for: CAN199 No results found for: RJW874  Lab Results  Component Value Date   TOTALPROTELP 6.8 03/11/2014   ALBUMINELP 56.1 03/11/2014   A1GS 5.6 (H) 03/11/2014   A2GS 13.4 (H) 03/11/2014   BETS 7.1 03/11/2014   BETA2SER 6.0 03/11/2014   GAMS 11.8 03/11/2014   MSPIKE NOT DET 03/11/2014   SPEI SEE NOTE 03/11/2014   Lab Results  Component Value Date   FERRITIN 321 (H) 01/04/2019  FERRITIN 326 (H) 01/03/2019   FERRITIN 353 (H) 01/02/2019   Lab Results  Component Value Date   LDH 152 12/31/2018    STUDIES:  Pathology: 09/13/2023 A. BREAST, RIGHT, LUMPECTOMY:       Invasive papillary  carcinoma, 0.9 cm, grade 2       Margins: All margins negative for invasive carcinoma  Closest, invasive:  0.5 mm from posterior margin  Lymphovascular invasion:  Not identified  Estrogen Receptor:  100% positive Progesterone Receptor:  100% positive Proliferation Marker Ki-67:  2%  HER2 (0) negative  EXAM: 07/01/2023 MRI HEAD WITHOUT AND WITH CONTRAST IMPRESSION: 1. No acute intracranial abnormality or significant interval change. 2. Central pontine T2 hyperintensity without associated enhancement. This may be related to chronic microvascular ischemia. Benign venous lesion is also considered. No other significant white matter disease is present. 3. Small mastoid effusions bilaterally. No obstructing nasopharyngeal lesion is present. 4. Polyp or mucous retention cyst of the left maxillary sinus.    I,Jasmine M Lassiter,acting as a scribe for Hailey VEAR Cornish, MD.,have documented all relevant documentation on the behalf of Hailey VEAR Cornish, MD,as directed by  Hailey VEAR Cornish, MD while in the presence of Hailey VEAR Cornish, MD.

## 2024-02-12 ENCOUNTER — Inpatient Hospital Stay: Admitting: Family Medicine

## 2024-02-16 ENCOUNTER — Telehealth: Payer: Self-pay

## 2024-02-16 ENCOUNTER — Other Ambulatory Visit: Payer: Self-pay | Admitting: Family Medicine

## 2024-02-16 NOTE — Patient Instructions (Signed)
 Rollo DELENA Public - I am sorry I was unable to reach you today for our scheduled appointment. I work with Cox, Abigail, MD and am calling to support your healthcare needs. Please contact me at [phone] at your earliest convenience. I look forward to speaking with you soon.   Thank you,  Thersia Hoar, BSW, MHA [place]  [facility] Social Worker, Population Health [phone]

## 2024-02-20 NOTE — Progress Notes (Unsigned)
 Subjective:  Patient ID: Hailey Greene, female    DOB: Nov 05, 1950  Age: 73 y.o. MRN: [id number]  Chief Complaint  Patient presents with   Ulcerative Colitis   Hospitalization Follow-up    HPI: Discussed the use of AI scribe software for clinical note transcription with the patient, who gave verbal consent to proceed.  History of Present Illness Hailey Greene is a 73 year old female who presents for follow-up after recent hospitalization and surgery for colitis and polyp removal.  Gastrointestinal symptoms and postoperative status - Hospitalized from November 1st to November 5th for colitis and bowel obstruction. - Underwent colonoscopy with multiple polypectomy and mass removal. - Pathology negative for microscopic colitis; C. diff and GI panel negative. - No current diarrhea, nausea, or vomiting. - Normal bowel movements. - No longer requires dicyclomine  or Reglan . - Not currently taking Miralax; used during hospitalization for bowel obstruction. - Requests refills for nausea and diarrhea medications for home use. - Feels well post-surgery.  Respiratory symptoms - Shortness of breath with activity. - Uses nebulizer treatment two to three times daily as needed. - Uses Symbicort , two puffs twice daily, as maintenance therapy. - Has not scheduled an appointment with a pulmonologist due to recent medical events.  Glycemic control - Ozempic  held during hospitalization due to gastrointestinal side effect concerns. - Has been off Ozempic  for two weeks and feels well. - Blood glucose during hospitalization ranged from 101 to 126. - Has not checked blood glucose in the past week due to malfunctioning meter. - Last hemoglobin A1c in October was 5.1.       02/06/2024   11:10 AM 12/27/2023    2:02 PM 12/27/2023   12:29 PM 11/30/2023    2:21 PM 11/23/2023    5:52 PM  Depression screen PHQ 2/9  Decreased Interest 0 0 1 1 0  Down, Depressed, Hopeless 0 0 1 1 0  PHQ - 2 Score  0 0 2 2 0  Altered sleeping   1 1   Tired, decreased energy   2 2   Change in appetite   2 2   Feeling bad or failure about yourself     1   Trouble concentrating   0 0   Moving slowly or fidgety/restless   0 0   Suicidal thoughts   0 0   PHQ-9 Score   7  8    Difficult doing work/chores   Somewhat difficult Somewhat difficult      Data saved with a previous flowsheet row definition        02/01/2024    9:51 AM  Fall Risk   Falls in the past year? 1  Number falls in past yr: 1  Injury with Fall? 0  Risk for fall due to : History of fall(s)  Follow up Falls evaluation completed;Education provided;Falls prevention discussed    Patient Care Team: Sherre Clapper, MD as PCP - General (Family Medicine) Rubie Kemps, MD as Consulting Physician (Orthopedic Surgery) Corlis Joen BIRCH, RN (Inactive) as Oncology Nurse Navigator Kay Hendricks MATSU, RN as [facility] Care Management Delene Thersia JINNY georgann DESIREE Care Management   Review of Systems  Constitutional:  Negative for chills, fatigue and fever.  HENT:  Negative for congestion, ear pain and sore throat.   Respiratory:  Negative for cough and shortness of breath.   Cardiovascular:  Negative for chest pain and palpitations.  Gastrointestinal:  Negative for abdominal pain, constipation, diarrhea, nausea and vomiting.  Endocrine: Negative  for polydipsia, polyphagia and polyuria.  Genitourinary:  Negative for difficulty urinating and dysuria.  Musculoskeletal:  Negative for arthralgias, back pain and myalgias.  Skin:  Negative for rash.  Neurological:  Negative for headaches.  Psychiatric/Behavioral:  Negative for dysphoric mood. The patient is not nervous/anxious.     Current Outpatient Medications on File Prior to Visit  Medication Sig Dispense Refill   acetaminophen  (TYLENOL ) 500 MG tablet Take 500 mg by mouth.     Albuterol -Budesonide  (AIRSUPRA ) 90-80 MCG/ACT AERO Inhale 2 puffs into the lungs 4 (four) times daily as needed. (Patient taking  differently: Inhale 2 puffs into the lungs every 4 (four) hours as needed (shortness/wheezing).) 10.7 g 3   amitriptyline  (ELAVIL ) 25 MG tablet TAKE 1 TABLET BY MOUTH EVERY EVENING 30 tablet 5   anastrozole  (ARIMIDEX ) 1 MG tablet Take 1 tablet (1 mg total) by mouth daily. 30 tablet 5   ASPIRIN  81 PO Take 81 mg by mouth daily.     budesonide -formoterol  (SYMBICORT ) 160-4.5 MCG/ACT inhaler Inhale 2 puffs into the lungs 2 (two) times daily.     busPIRone  (BUSPAR ) 15 MG tablet Take 1 tablet (15 mg total) by mouth 2 (two) times daily. 180 tablet 1   dicyclomine  (BENTYL ) 20 MG tablet TAKE 1 TABLET (20 MG TOTAL) BY MOUTH 4 (FOUR) TIMES DAILY AS NEEDED (INTESTINAL SPASMS). 360 tablet 1   Eszopiclone  3 MG TABS Take 3 mg by mouth at bedtime. Take immediately before bedtime     ezetimibe  (ZETIA ) 10 MG tablet TAKE 1 TABLET BY MOUTH EVERY DAY 90 tablet 1   fluticasone  (FLONASE ) 50 MCG/ACT nasal spray SPRAY 2 SPRAYS INTO EACH NOSTRIL EVERY DAY 48 g 2   glucose blood (ONETOUCH VERIO) test strip E11.42 Use new test strip each time when checking FBS 100 each 2   hydrOXYzine  (VISTARIL ) 25 MG capsule Take 1 capsule (25 mg total) by mouth at bedtime as needed. 90 capsule 0   Lancets (ONETOUCH DELICA PLUS LANCET33G) MISC E 11.42 Use new lancet each time when checking FBS 100 each 2   loratadine  (CLARITIN ) 10 MG tablet Take 1 tablet (10 mg total) by mouth daily. 90 tablet 3   metoCLOPramide  (REGLAN ) 10 MG tablet Take 10 mg by mouth 4 (four) times daily.     mirtazapine  (REMERON ) 15 MG tablet Take 15 mg by mouth at bedtime.     montelukast (SINGULAIR) 10 MG tablet Take 10 mg by mouth at bedtime.     Multiple Vitamin (MULTIVITAMIN WITH MINERALS) TABS tablet Take 1 tablet by mouth daily.     omeprazole  (PRILOSEC) 20 MG capsule TAKE 1 CAPSULE BY MOUTH EVERY DAY 90 capsule 1   pregabalin  (LYRICA ) 300 MG capsule Take 1 capsule (300 mg total) by mouth 2 (two) times daily. 60 capsule 2   ramipril  (ALTACE ) 2.5 MG capsule TAKE 1  CAPSULE BY MOUTH EVERY DAY 90 capsule 1   rOPINIRole  (REQUIP ) 3 MG tablet Take 1 tablet (3 mg total) by mouth daily. 90 tablet 0   rosuvastatin  (CRESTOR ) 40 MG tablet Take 1 tablet (40 mg total) by mouth daily. 90 tablet 0   solifenacin  (VESICARE ) 5 MG tablet Take 1 tablet (5 mg total) by mouth daily. 90 tablet 0   topiramate  (TOPAMAX ) 50 MG tablet Take 1 tablet (50 mg total) by mouth daily. 90 tablet 0   traZODone  (DESYREL ) 50 MG tablet Take 1 tablet (50 mg total) by mouth at bedtime. 90 tablet 0   vortioxetine  HBr (TRINTELLIX ) 20 MG TABS  tablet Take 1 tablet (20 mg total) by mouth daily. 90 tablet 0   No current facility-administered medications on file prior to visit.   Past Medical History:  Diagnosis Date   2019 novel coronavirus disease (COVID-19) 12/31/2018   Acute hypoxemic respiratory failure due to COVID-19 (HCC)    Asthma    Bronchiectasis (HCC)    Chicken pox    Diabetes (HCC)    GERD (gastroesophageal reflux disease)    Hypertension    IBS (irritable bowel syndrome)    Major depressive disorder    Mixed hyperlipidemia    Neuromuscular disorder (HCC)    peripheral neuropathy   Neuropathy    Osteoarthritis    Pneumonia    x2   Primary insomnia    Primary osteoarthritis of left knee 05/31/2019   RLS (restless legs syndrome)    S/P total knee replacement 07/15/2019   Sleep apnea    does not use CPAP   Status post total knee replacement 07/25/2019   Tachycardia    Urge incontinence    UTI (lower urinary tract infection)    Weakness    Past Surgical History:  Procedure Laterality Date   ABDOMINAL HYSTERECTOMY     APPENDECTOMY     BREAST BIOPSY Right 04/26/2023   US  RT BREAST BX W LOC DEV 1ST LESION IMG BX SPEC US  GUIDE 04/26/2023 [facility]   BREAST BIOPSY  09/12/2023   US  RT RADIOACTIVE SEED LOC 09/12/2023 [facility]   BREAST LUMPECTOMY     CHOLECYSTECTOMY     EXCISION OF BREAST BIOPSY Right 09/13/2023   Procedure: EXCISION OF BREAST BIOPSY  WITH RADIOACTIVE SEED GUIDED LOCALIZATION;  Surgeon: Belinda Cough, MD;  Location: [facility];  Service: General;  Laterality: Right;  Right breast rsl excisional biopsy   HERNIA REPAIR     REPLACEMENT TOTAL KNEE Right    TONSILLECTOMY     TOTAL KNEE ARTHROPLASTY Left 07/15/2019   Procedure: TOTAL KNEE ARTHROPLASTY;  Surgeon: Rubie Kemps, MD;  Location: [facility];  Service: Orthopedics;  Laterality: Left;   TUBAL LIGATION      Family History  Problem Relation Age of Onset   Thyroid  disease Mother    Cancer Mother    Migraines Mother    Brain cancer Father    Throat cancer Father    Cancer - Other Father    Heart disease Maternal Grandmother    Diabetes Daughter    Social History   Socioeconomic History   Marital status: Married    Spouse name: Alm   Number of children: 3   Years of education: 12   Highest education level: Not on file  Occupational History   Occupation: Housewife  Tobacco Use   Smoking status: Former    Current packs/day: 0.00    Average packs/day: 2.0 packs/day for 12.0 years (24.0 ttl pk-yrs)    Types: Cigarettes    Start date: 05/25/1998    Quit date: 05/25/2010    Years since quitting: 13.7   Smokeless tobacco: Never   Tobacco comments:    Used to smoke 1-2 packs a days  Vaping Use   Vaping status: Never Used  Substance and Sexual Activity   Alcohol use: Not Currently    Comment: past occasionally   Drug use: Never   Sexual activity: Not Currently    Birth control/protection: Surgical    Comment: hyst  Other Topics Concern   Not on file  Social History Narrative   Born and raised in [place], [place].  Currently lives in a house with her husband. 1 dog. Fun: Garden, feed birds, swimming   Denies any religious beliefs effecting health care.    Social Drivers of Health   Financial Resource Strain: Medium Risk (10/02/2023)   Overall Financial Resource Strain (CARDIA)    Difficulty of Paying Living Expenses: Somewhat hard  Food Insecurity: Food Insecurity  Present (02/01/2024)   Hunger Vital Sign    Worried About Running Out of Food in the Last Year: Sometimes true    Ran Out of Food in the Last Year: Sometimes true  Transportation Needs: No Transportation Needs (02/01/2024)   PRAPARE - Administrator, Civil Service (Medical): No    Lack of Transportation (Non-Medical): No  Physical Activity: Insufficiently Active (08/19/2023)   Exercise Vital Sign    Days of Exercise per Week: 4 days    Minutes of Exercise per Session: 20 min  Stress: No Stress Concern Present (09/28/2023)   [name] of Occupational Health - Occupational Stress Questionnaire    Feeling of Stress: Not at all  Recent Concern: Stress - Stress Concern Present (08/19/2023)   [name] of Occupational Health - Occupational Stress Questionnaire    Feeling of Stress : Rather much  Social Connections: Socially Integrated (11/23/2023)   Social Connection and Isolation Panel    Frequency of Communication with Friends and Family: More than three times a week    Frequency of Social Gatherings with Friends and Family: Three times a week    Attends Religious Services: More than 4 times per year    Active Member of Clubs or Organizations: Yes    Attends Banker Meetings: More than 4 times per year    Marital Status: Married    Objective:  BP 100/70   Pulse 90   Temp (!) 97.4 F (36.3 C)   Resp 16   Ht 5' 3 (1.6 m)   Wt 169 lb (76.7 kg)   SpO2 97%   BMI 29.94 kg/m      02/21/2024   10:53 AM 02/06/2024   11:08 AM 01/26/2024   10:57 AM  BP/Weight  Systolic BP 100 146 132  Diastolic BP 70 82 74  Wt. (Lbs) 169 168.4 169  BMI 29.94 kg/m2 29.83 kg/m2 29.94 kg/m2    Physical Exam  {Perform Simple Foot Exam  Perform Detailed exam:1} {Insert foot Exam (Optional):30965}   Lab Results  Component Value Date   WBC 8.2 02/21/2024   HGB 14.2 02/21/2024   HCT 44.7 02/21/2024   PLT 246 02/21/2024   GLUCOSE 93 02/21/2024   CHOL 241 (H)  08/23/2023   TRIG 122 08/23/2023   HDL 61 08/23/2023   LDLCALC 158 (H) 08/23/2023   ALT 20 02/21/2024   AST 20 02/21/2024   NA 138 02/21/2024   K 5.2 02/21/2024   CL 101 02/21/2024   CREATININE 0.91 02/21/2024   BUN 15 02/21/2024   CO2 25 02/21/2024   TSH 0.966 08/23/2023   HGBA1C 5.1 01/16/2024    Results for orders placed or performed in visit on 02/21/24  CBC with Differential/Platelet   Collection Time: 02/21/24 11:45 AM  Result Value Ref Range   WBC 8.2 3.4 - 10.8 x10E3/uL   RBC 4.72 3.77 - 5.28 x10E6/uL   Hemoglobin 14.2 11.1 - 15.9 g/dL   Hematocrit 55.2 65.9 - 46.6 %   MCV 95 79 - 97 fL   MCH 30.1 26.6 - 33.0 pg   MCHC 31.8 31.5 - 35.7  g/dL   RDW 87.1 88.2 - 84.5 %   Platelets 246 150 - 450 x10E3/uL   Neutrophils 71 Not Estab. %   Lymphs 19 Not Estab. %   Monocytes 7 Not Estab. %   Eos 2 Not Estab. %   Basos 1 Not Estab. %   Neutrophils Absolute 5.9 1.4 - 7.0 x10E3/uL   Lymphocytes Absolute 1.6 0.7 - 3.1 x10E3/uL   Monocytes Absolute 0.6 0.1 - 0.9 x10E3/uL   EOS (ABSOLUTE) 0.1 0.0 - 0.4 x10E3/uL   Basophils Absolute 0.0 0.0 - 0.2 x10E3/uL   Immature Granulocytes 0 Not Estab. %   Immature Grans (Abs) 0.0 0.0 - 0.1 x10E3/uL  Comprehensive metabolic panel with GFR   Collection Time: 02/21/24 11:45 AM  Result Value Ref Range   Glucose 93 70 - 99 mg/dL   BUN 15 8 - 27 mg/dL   Creatinine, Ser 9.08 0.57 - 1.00 mg/dL   eGFR 67 >40 fO/fpw/8.26   BUN/Creatinine Ratio 16 12 - 28   Sodium 138 134 - 144 mmol/L   Potassium 5.2 3.5 - 5.2 mmol/L   Chloride 101 96 - 106 mmol/L   CO2 25 20 - 29 mmol/L   Calcium  10.8 (H) 8.7 - 10.3 mg/dL   Total Protein 6.5 6.0 - 8.5 g/dL   Albumin 4.1 3.8 - 4.8 g/dL   Globulin, Total 2.4 1.5 - 4.5 g/dL   Bilirubin Total 0.3 0.0 - 1.2 mg/dL   Alkaline Phosphatase 77 49 - 135 IU/L   AST 20 0 - 40 IU/L   ALT 20 0 - 32 IU/L  .  Assessment & Plan:   Assessment & Plan Diabetic polyneuropathy associated with type 2 diabetes mellitus  (HCC) Type 2 diabetes mellitus previously controlled with Ozempic , held due to gastrointestinal side effects. Last A1c was 5.1. Blood sugars stable without Ozempic . Monitoring inconsistent due to malfunctioning meter. - Sent a new blood glucose meter. - Check blood sugars once a week. - Ordered blood count and chemistry panel to monitor kidney and liver function.    Simple chronic bronchitis (HCC) COPD with dyspnea on exertion. Using nebulizer as needed. Not using Symbicort  regularly. - Continue Symbicort , two puffs twice a day. - Use nebulizer as needed for dyspnea. - Schedule an appointment with a pulmonologist.    Colitis Colonic polyps and colonic mass, status post polypectomy and removal, with history of colonic obstruction Status post colonoscopy with multiple polypectomy and removal of a colonic mass. Microscopic colitis ruled out. C. diff and GI panel negative. No current symptoms. Previous obstruction resolved post-surgery. - Continue to avoid Ozempic  due to potential gastrointestinal side effects. - Prescribed medications for diarrhea and nausea as a precaution. Orders:   loperamide  (IMODIUM ) 2 MG capsule; Take 1 capsule (2 mg total) by mouth as needed for diarrhea or loose stools (4 times a day as needed for diarrhea).   ondansetron  (ZOFRAN -ODT) 4 MG disintegrating tablet; Take 1 tablet (4 mg total) by mouth every 6 (six) hours as needed for nausea or vomiting.   CBC with Differential/Platelet   Comprehensive metabolic panel with GFR  Body mass index is 29.94 kg/m.   Meds ordered this encounter  Medications   loperamide  (IMODIUM ) 2 MG capsule    Sig: Take 1 capsule (2 mg total) by mouth as needed for diarrhea or loose stools (4 times a day as needed for diarrhea).    Dispense:  30 capsule    Refill:  1   ondansetron  (ZOFRAN -ODT) 4 MG disintegrating tablet  Sig: Take 1 tablet (4 mg total) by mouth every 6 (six) hours as needed for nausea or vomiting.    Dispense:  30  tablet    Refill:  1   Blood Glucose Monitoring Suppl DEVI    Sig: 1 each by Does not apply route as directed. Dispense based on patient and insurance preference. Use up to four times daily as directed. (FOR ICD-10 E10.9, E11.9).    Dispense:  1 each    Refill:  0   Glucose Blood (BLOOD GLUCOSE TEST STRIPS) STRP    Sig: 1 each by Does not apply route as directed. Dispense based on patient and insurance preference. Use up to four times daily as directed. (FOR ICD-10 E10.9, E11.9).    Dispense:  100 strip    Refill:  0   Lancet Device MISC    Sig: 1 each by Does not apply route as directed. Dispense based on patient and insurance preference. Use up to four times daily as directed. (FOR ICD-10 E10.9, E11.9).    Dispense:  1 each    Refill:  0   Lancets MISC    Sig: 1 each by Does not apply route as directed. Dispense based on patient and insurance preference. Use up to four times daily as directed. (FOR ICD-10 E10.9, E11.9).    Dispense:  100 each    Refill:  0    Orders Placed This Encounter  Procedures   CBC with Differential/Platelet   Comprehensive metabolic panel with GFR     I,Marla I Leal-Borjas,acting as a scribe for Abigail Free, MD.,have documented all relevant documentation on the behalf of Abigail Free, MD,as directed by  Abigail Free, MD while in the presence of Abigail Free, MD.   Follow-up: Return in about 3 months (around 05/23/2024) for chronic follow up.  An After Visit Summary was printed and given to the patient.  Abigail Free, MD [facility] [phone]

## 2024-02-21 ENCOUNTER — Ambulatory Visit (INDEPENDENT_AMBULATORY_CARE_PROVIDER_SITE_OTHER): Admitting: Family Medicine

## 2024-02-21 VITALS — BP 100/70 | HR 90 | Temp 97.4°F | Resp 16 | Ht 63.0 in | Wt 169.0 lb

## 2024-02-21 DIAGNOSIS — K529 Noninfective gastroenteritis and colitis, unspecified: Secondary | ICD-10-CM | POA: Diagnosis not present

## 2024-02-21 DIAGNOSIS — J410 Simple chronic bronchitis: Secondary | ICD-10-CM | POA: Diagnosis not present

## 2024-02-21 DIAGNOSIS — E1142 Type 2 diabetes mellitus with diabetic polyneuropathy: Secondary | ICD-10-CM | POA: Diagnosis not present

## 2024-02-21 MED ORDER — BLOOD GLUCOSE MONITORING SUPPL DEVI: 1.0000 | 0 refills | Status: AC

## 2024-02-21 MED ORDER — BLOOD GLUCOSE TEST VI STRP: 1.0000 | ORAL_STRIP | 0 refills | Status: AC

## 2024-02-21 MED ORDER — LANCET DEVICE MISC: 1.0000 | 0 refills | Status: AC

## 2024-02-21 MED ORDER — LOPERAMIDE HCL 2 MG PO CAPS: 2.0000 mg | ORAL_CAPSULE | ORAL | 1 refills | Status: DC | PRN

## 2024-02-21 MED ORDER — ONDANSETRON 4 MG PO TBDP: 4.0000 mg | ORAL_TABLET | Freq: Four times a day (QID) | ORAL | 1 refills | Status: AC | PRN

## 2024-02-21 MED ORDER — LANCETS MISC: 1.0000 | 0 refills | Status: AC

## 2024-02-21 NOTE — Assessment & Plan Note (Addendum)
 Colonic polyps and colonic mass, status post polypectomy and removal, with history of colonic obstruction Status post colonoscopy with multiple polypectomy and removal of a colonic mass. Microscopic colitis ruled out. C. diff and GI panel negative. No current symptoms. Previous obstruction resolved post-surgery. - Continue to avoid Ozempic  due to potential gastrointestinal side effects. - Prescribed medications for diarrhea and nausea as a precaution. Orders:   loperamide  (IMODIUM ) 2 MG capsule; Take 1 capsule (2 mg total) by mouth as needed for diarrhea or loose stools (4 times a day as needed for diarrhea).   ondansetron  (ZOFRAN -ODT) 4 MG disintegrating tablet; Take 1 tablet (4 mg total) by mouth every 6 (six) hours as needed for nausea or vomiting.   CBC with Differential/Platelet   Comprehensive metabolic panel with GFR

## 2024-02-21 NOTE — Patient Instructions (Signed)
  VISIT SUMMARY: You had a follow-up visit after your recent hospitalization and surgery for colitis and polyp removal. You are feeling well post-surgery with no current gastrointestinal symptoms. We discussed your respiratory symptoms and diabetes management.  YOUR PLAN: COLONIC POLYPS: You had multiple polyps and a mass removed during your recent colonoscopy. There are no current symptoms, and previous issues have resolved post-surgery. -Continue to avoid Ozempic  due to potential gastrointestinal side effects. -You have been prescribed medications for diarrhea and nausea as a precaution.  TYPE 2 DIABETES MELLITUS: Your diabetes was previously controlled with Ozempic , which was stopped due to gastrointestinal side effects. Your blood sugars have been stable without it. -A new blood glucose meter has been sent to you. -Check your blood sugars once a week. -We have ordered blood count and chemistry panel tests to monitor your kidney and liver function.  CHRONIC OBSTRUCTIVE PULMONARY DISEASE (COPD): You have COPD with shortness of breath during activity. You are using a nebulizer as needed and Symbicort  for maintenance. -Continue using Symbicort , two puffs twice a day. -Use your nebulizer as needed for shortness of breath. -Schedule an appointment with a pulmonologist.                      Contains text generated by [employer]bridge.                                 Contains text generated by Abridge.

## 2024-02-21 NOTE — Assessment & Plan Note (Addendum)
 Type 2 diabetes mellitus previously controlled with Ozempic , held due to gastrointestinal side effects. Last A1c was 5.1. Blood sugars stable without Ozempic . Monitoring inconsistent due to malfunctioning meter. - Sent a new blood glucose meter. - Check blood sugars once a week. - Ordered blood count and chemistry panel to monitor kidney and liver function.

## 2024-02-22 LAB — COMPREHENSIVE METABOLIC PANEL WITH GFR
ALT: 20 IU/L (ref 0–32)
AST: 20 IU/L (ref 0–40)
Albumin: 4.1 g/dL (ref 3.8–4.8)
Alkaline Phosphatase: 77 IU/L (ref 49–135)
BUN/Creatinine Ratio: 16 (ref 12–28)
BUN: 15 mg/dL (ref 8–27)
Bilirubin Total: 0.3 mg/dL (ref 0.0–1.2)
CO2: 25 mmol/L (ref 20–29)
Calcium: 10.8 mg/dL — ABNORMAL HIGH (ref 8.7–10.3)
Chloride: 101 mmol/L (ref 96–106)
Creatinine, Ser: 0.91 mg/dL (ref 0.57–1.00)
Globulin, Total: 2.4 g/dL (ref 1.5–4.5)
Glucose: 93 mg/dL (ref 70–99)
Potassium: 5.2 mmol/L (ref 3.5–5.2)
Sodium: 138 mmol/L (ref 134–144)
Total Protein: 6.5 g/dL (ref 6.0–8.5)
eGFR: 67 mL/min/1.73 (ref >59)

## 2024-02-22 LAB — CBC WITH DIFFERENTIAL/PLATELET
Basophils Absolute: 0 x10E3/uL (ref 0.0–0.2)
Basos: 1 %
EOS (ABSOLUTE): 0.1 x10E3/uL (ref 0.0–0.4)
Eos: 2 %
Hematocrit: 44.7 % (ref 34.0–46.6)
Hemoglobin: 14.2 g/dL (ref 11.1–15.9)
Immature Grans (Abs): 0 x10E3/uL (ref 0.0–0.1)
Immature Granulocytes: 0 %
Lymphocytes Absolute: 1.6 x10E3/uL (ref 0.7–3.1)
Lymphs: 19 %
MCH: 30.1 pg (ref 26.6–33.0)
MCHC: 31.8 g/dL (ref 31.5–35.7)
MCV: 95 fL (ref 79–97)
Monocytes Absolute: 0.6 x10E3/uL (ref 0.1–0.9)
Monocytes: 7 %
Neutrophils Absolute: 5.9 x10E3/uL (ref 1.4–7.0)
Neutrophils: 71 %
Platelets: 246 x10E3/uL (ref 150–450)
RBC: 4.72 x10E6/uL (ref 3.77–5.28)
RDW: 12.8 % (ref 11.7–15.4)
WBC: 8.2 x10E3/uL (ref 3.4–10.8)

## 2024-02-23 NOTE — Assessment & Plan Note (Signed)
 COPD with dyspnea on exertion. Using nebulizer as needed. Not using Symbicort  regularly. - Continue Symbicort , two puffs twice a day. - Use nebulizer as needed for dyspnea. - Schedule an appointment with a pulmonologist.

## 2024-02-25 ENCOUNTER — Ambulatory Visit: Payer: Self-pay | Admitting: Family Medicine

## 2024-02-26 ENCOUNTER — Encounter: Payer: Self-pay | Admitting: Family Medicine

## 2024-02-26 ENCOUNTER — Other Ambulatory Visit: Payer: Self-pay | Admitting: Family Medicine

## 2024-02-26 DIAGNOSIS — J4520 Mild intermittent asthma, uncomplicated: Secondary | ICD-10-CM

## 2024-02-28 ENCOUNTER — Ambulatory Visit
Admission: RE | Admit: 2024-02-28 | Discharge: 2024-02-28 | Disposition: A | Source: Ambulatory Visit | Attending: Oncology | Admitting: Oncology

## 2024-02-28 DIAGNOSIS — C50411 Malignant neoplasm of upper-outer quadrant of right female breast: Secondary | ICD-10-CM

## 2024-03-05 ENCOUNTER — Other Ambulatory Visit: Payer: Self-pay

## 2024-03-05 NOTE — Patient Instructions (Signed)
 Visit Information  Thank you for taking time to visit with me today. Please don't hesitate to contact me if I can be of assistance to you before our next scheduled appointment.  Your next care management appointment is by telephone on  03/19/24 at 11am    Please call the care guide team at [phone] if you need to cancel, schedule, or reschedule an appointment.   Please call the Suicide and Crisis Lifeline: 988 call the [place]  National Suicide Prevention Lifeline: 985-676-2938 or TTY: 609-105-8201 TTY 3203981052) to talk to a trained counselor call 1-800-273-TALK (toll free, 24 hour hotline) call 911 if you are experiencing a Mental Health or Behavioral Health Crisis or need someone to talk to.  Thersia Hoar, [name], MHA [place]  [facility] Social Worker, Population Health [phone]

## 2024-03-05 NOTE — Patient Outreach (Signed)
 Social Drivers of Health  Community Resource and Care Coordination Visit Note   03/05/2024  Name: Hailey Greene MRN: [id number] DOB:[birth year]  Situation: Referral received for [facility] needs assessment and assistance related to Housing  Food Insecurity  rent assistance. I obtained verbal consent from Patient.  Visit completed with Patient on the phone.   Background:      Assessment:   Goals Addressed             This Visit's Progress    BSW VBCI Social Work Care Plan       Problems:   [employer] Insecurity   CSW Clinical Goal(s):   Over the next 3 weeks the Patient will will follow up with the resources for the food pantry and rental assistance as directed by Social Work.  Interventions:  SW will resend food and rental resources Resources resent to patient  Patient Goals/Self-Care Activities:  Review resources mailed  Plan:   The care management team will reach out to the patient again over the next 15 days.        Recommendation:   follow up with resources regarding housing needs call and/or follow up with resources for food assistance  Follow Up Plan:   Follow up on 03/19/24 at 11am  Thersia Hoar, BSW, [facility] [place]  [facility] Social Worker, Population Health [phone]

## 2024-03-19 ENCOUNTER — Telehealth: Payer: Self-pay

## 2024-03-19 NOTE — Patient Instructions (Signed)
 Rollo DELENA [facility] - I am sorry I was unable to reach you today for our scheduled appointment. I work with Cox, Abigail, MD and am calling to support your healthcare needs. Please contact me at [phone] at your earliest convenience. I look forward to speaking with you soon.   Thank you,  Thersia Hoar, BSW, MHA [place]  [facility] Social Worker, Population Health [phone] e)

## 2024-03-25 ENCOUNTER — Telehealth: Payer: Self-pay

## 2024-03-25 NOTE — Transitions of Care (Post Inpatient/ED Visit) (Signed)
" ° °  03/25/2024  Name: Hailey Greene MRN: [id number] DOB: 01/29/1951  Today's TOC FU Call Status: Today's TOC FU Call Status:: Unsuccessful Call (1st Attempt) Unsuccessful Call (1st Attempt) Date: 03/25/24  Attempted to reach the patient regarding the most recent Inpatient/ED visit.  Follow Up Plan: Additional outreach attempts will be made to reach the patient to complete the Transitions of Care (Post Inpatient/ED visit) call.   Shona Prow RN, CCM [place]  VBCI-Population Health RN Care Manager [phone]  "

## 2024-03-26 ENCOUNTER — Telehealth: Payer: Self-pay

## 2024-03-26 NOTE — Transitions of Care (Post Inpatient/ED Visit) (Signed)
" ° °  03/26/2024  Name: Hailey Greene MRN: [id number] DOB: 1950/09/27  Today's TOC FU Call Status: Today's TOC FU Call Status:: Unsuccessful Call (2nd Attempt) Unsuccessful Call (2nd Attempt) Date: 03/26/24  Attempted to reach the patient regarding the most recent Inpatient/ED visit.  Follow Up Plan: Additional outreach attempts will be made to reach the patient to complete the Transitions of Care (Post Inpatient/ED visit) call.   Shona Prow RN, CCM [place]  VBCI-Population Health RN Care Manager [phone]  "

## 2024-03-27 ENCOUNTER — Telehealth: Payer: Self-pay

## 2024-03-27 NOTE — Transitions of Care (Post Inpatient/ED Visit) (Signed)
" ° °  03/27/2024  Name: Hailey Greene MRN: [id number] DOB: 02/28/1951  Today's TOC FU Call Status: Today's TOC FU Call Status:: Unsuccessful Call (3rd Attempt) Unsuccessful Call (3rd Attempt) Date: 03/27/24  Attempted to reach the patient regarding the most recent Inpatient/ED visit.  Follow Up Plan: No further outreach attempts will be made at this time. We have been unable to contact the patient.  Shona Prow RN, CCM [place]  [facility]Population Health RN Care Manager [phone]  "

## 2024-04-02 ENCOUNTER — Telehealth: Payer: Self-pay

## 2024-04-02 NOTE — Patient Instructions (Signed)
 Rollo DELENA Public - I am sorry I was unable to reach you today for our scheduled appointment. I work with Cox, Abigail, MD and am calling to support your healthcare needs. Please contact me at [phone] at your earliest convenience. I look forward to speaking with you soon.   Thank you,  Thersia Hoar, BSW, MHA [place]  [facility] Social Worker, Population Health [phone]

## 2024-04-11 ENCOUNTER — Other Ambulatory Visit: Payer: Self-pay | Admitting: Family Medicine

## 2024-04-11 DIAGNOSIS — I10 Essential (primary) hypertension: Secondary | ICD-10-CM

## 2024-04-11 DIAGNOSIS — E782 Mixed hyperlipidemia: Secondary | ICD-10-CM

## 2024-04-11 DIAGNOSIS — K219 Gastro-esophageal reflux disease without esophagitis: Secondary | ICD-10-CM

## 2024-04-12 ENCOUNTER — Other Ambulatory Visit: Payer: Self-pay | Admitting: Family Medicine

## 2024-04-16 ENCOUNTER — Other Ambulatory Visit: Payer: Self-pay | Admitting: Family Medicine

## 2024-04-16 DIAGNOSIS — J301 Allergic rhinitis due to pollen: Secondary | ICD-10-CM

## 2024-04-19 ENCOUNTER — Telehealth: Payer: Self-pay

## 2024-04-19 NOTE — Patient Instructions (Signed)
 Rollo DELENA Public - I am sorry I was unable to reach you today for our scheduled appointment. I work with Cox, Abigail, MD and am calling to support your healthcare needs. Please contact me at [phone] at your earliest convenience. I look forward to speaking with you soon.   Thank you,  Thersia Hoar, BSW, MHA [place]  [facility] Social Worker, Population Health [phone]

## 2024-04-24 ENCOUNTER — Ambulatory Visit: Admitting: Family Medicine

## 2024-04-26 ENCOUNTER — Ambulatory Visit: Admitting: Family Medicine

## 2024-04-26 ENCOUNTER — Ambulatory Visit: Payer: Self-pay | Admitting: Family Medicine

## 2024-04-26 VITALS — BP 128/72 | HR 82 | Temp 98.0°F | Ht 63.0 in | Wt 138.0 lb

## 2024-04-26 DIAGNOSIS — C50411 Malignant neoplasm of upper-outer quadrant of right female breast: Secondary | ICD-10-CM

## 2024-04-26 DIAGNOSIS — Z170 Estrogen receptor positive status [ER+]: Secondary | ICD-10-CM | POA: Diagnosis not present

## 2024-04-26 DIAGNOSIS — J4550 Severe persistent asthma, uncomplicated: Secondary | ICD-10-CM

## 2024-04-26 DIAGNOSIS — E782 Mixed hyperlipidemia: Secondary | ICD-10-CM

## 2024-04-26 DIAGNOSIS — F330 Major depressive disorder, recurrent, mild: Secondary | ICD-10-CM | POA: Diagnosis not present

## 2024-04-26 DIAGNOSIS — F5101 Primary insomnia: Secondary | ICD-10-CM

## 2024-04-26 DIAGNOSIS — I10 Essential (primary) hypertension: Secondary | ICD-10-CM

## 2024-04-26 DIAGNOSIS — K219 Gastro-esophageal reflux disease without esophagitis: Secondary | ICD-10-CM | POA: Diagnosis not present

## 2024-04-26 DIAGNOSIS — E1142 Type 2 diabetes mellitus with diabetic polyneuropathy: Secondary | ICD-10-CM

## 2024-04-26 LAB — POCT LIPID PANEL
HDL: 59
LDL: 32
Non-HDL: 48
TC: 107
TRG: 83

## 2024-04-26 LAB — POCT GLYCOSYLATED HEMOGLOBIN (HGB A1C): HbA1c POC (<> result, manual entry): 5.6 %

## 2024-04-26 NOTE — Patient Instructions (Signed)
 STOP MIRTAZIPINE AND OMEPRAZOLE .  Recommend continue to work on eating healthy diet and exercise.

## 2024-04-26 NOTE — Progress Notes (Unsigned)
 "  Subjective:  Patient ID: [name], female    DOB: 27-Mar-1951  Age: 74 y.o. MRN: [id number]  Chief Complaint  Patient presents with   Medical Management of Chronic Issues    HPI: Discussed the use of AI scribe software for clinical note transcription with the patient, who gave verbal consent to proceed.  History of Present Illness Hailey Greene is a 74 year old female who presents for medication management and follow-up for insomnia and respiratory issues.  Insomnia - Difficulty initiating sleep, but able to remain asleep once asleep unless awakening to use the bathroom - Able to return to sleep easily after bathroom trips - Sleep onset latency sometimes up to 30 minutes, often due to thinking about tasks for the next day - Maintains a consistent bedtime routine, going to bed around 7 PM - Sleep duration ranges from 8 to 12 hours - Current medications for sleep: amitriptyline , trazodone , Lunesta , mirtazapine   Respiratory symptoms - Breathing stable overall - Uses nebulizer once daily, sometimes more if experiencing increased work of breathing - Uses Symbicort  (two puffs twice daily) - Development Worker, Community as a rescue inhaler, not needed daily - Uses nebulizer machine in the morning, uncertain if daily use is necessary - No current cough; recent bad cough has resolved - No fevers, chills, sweats, earaches, sore throat, or nasal congestion  Anxiety - On BoostGround 15 mg twice daily and Trintellix  20 mg daily for anxiety  Gastrointestinal symptoms and history - No abdominal pain, bowel problems, or gastric symptoms currently - History of bowel surgery in November at [facility] with no postoperative complications - On Reglan  10 mg before meals and at bedtime for gastric emptying issues - On omeprazole  for acid reflux; infrequent need for Tums - Does not take Imodium  or Bentyl  regularly  Bladder symptoms - No current bladder issues - On Vesicare  for bladder  symptoms  Restless legs syndrome and neuropathy - On ropinirole  3 mg daily for restless legs; effective for movement but not for pain - On Lyrica  300 mg twice daily for neuropathy; not fully effective  Metabolic and cardiovascular health - On Topamax  50 mg daily for weight loss - On rosuvastatin  40 mg daily for hyperlipidemia - On ramipril  2.5 mg daily for blood pressure control - Blood sugars have been stable - Takes aspirin  81 mg daily  Allergies - Uses loratadine  seasonally for allergies  Oncologic history - On anastrozole  for breast cancer  Pain management - Uses Tylenol  as needed       04/26/2024    9:10 AM 02/06/2024   11:10 AM 12/27/2023    2:02 PM 12/27/2023   12:29 PM 11/30/2023    2:21 PM  Depression screen PHQ 2/9  Decreased Interest 0 0 0 1 1  Down, Depressed, Hopeless 1 0 0 1 1  PHQ - 2 Score 1 0 0 2 2  Altered sleeping 3   1 1   Tired, decreased energy 0   2 2  Change in appetite 0   2 2  Feeling bad or failure about yourself  0    1  Trouble concentrating 0   0 0  Moving slowly or fidgety/restless 0   0 0  Suicidal thoughts 0   0 0  PHQ-9 Score 4   7  8    Difficult doing work/chores Somewhat difficult   Somewhat difficult Somewhat difficult     Data saved with a previous flowsheet row definition  02/01/2024    9:51 AM  Fall Risk   Falls in the past year? 1  Number falls in past yr: 1  Injury with Fall? 0   Risk for fall due to : History of fall(s)  Follow up Falls evaluation completed;Education provided;Falls prevention discussed     Data saved with a previous flowsheet row definition    Patient Care Team: Sherre Clapper, MD as PCP - General (Family Medicine) Rubie Kemps, MD as Consulting Physician (Orthopedic Surgery) Corlis Joen BIRCH, RN (Inactive) as Oncology Nurse Navigator Kay Hendricks MATSU, RN as VBCI Care Management Delene Thersia JINNY georgann DESIREE Care Management   Review of Systems  Constitutional:  Negative for chills, fatigue and fever.   HENT:  Negative for congestion, ear pain and sore throat.   Respiratory:  Negative for cough and shortness of breath.   Cardiovascular:  Negative for chest pain.  Gastrointestinal:  Negative for abdominal pain, constipation, diarrhea, nausea and vomiting.  Genitourinary:  Negative for dysuria and urgency.  Musculoskeletal:  Negative for arthralgias and myalgias.  Skin:  Negative for rash.  Neurological:  Negative for dizziness and headaches.  Psychiatric/Behavioral:  Negative for dysphoric mood. The patient is not nervous/anxious.     Medications Ordered Prior to Encounter[1] Past Medical History:  Diagnosis Date   2019 novel coronavirus disease (COVID-19) 12/31/2018   Acute hypoxemic respiratory failure due to COVID-19 (HCC)    Asthma    Bronchiectasis (HCC)    Chicken pox    Diabetes (HCC)    GERD (gastroesophageal reflux disease)    Hypertension    IBS (irritable bowel syndrome)    Major depressive disorder    Mixed hyperlipidemia    Neuromuscular disorder (HCC)    peripheral neuropathy   Neuropathy    Osteoarthritis    Pneumonia    x2   Primary insomnia    Primary osteoarthritis of left knee 05/31/2019   RLS (restless legs syndrome)    S/P total knee replacement 07/15/2019   Sleep apnea    does not use CPAP   Status post total knee replacement 07/25/2019   Tachycardia    Urge incontinence    UTI (lower urinary tract infection)    Weakness    Past Surgical History:  Procedure Laterality Date   ABDOMINAL HYSTERECTOMY     APPENDECTOMY     BREAST BIOPSY Right 04/26/2023   US  RT BREAST BX W LOC DEV 1ST LESION IMG BX SPEC US  GUIDE 04/26/2023 [facility]   BREAST BIOPSY  09/12/2023   US  RT RADIOACTIVE SEED LOC 09/12/2023 [facility]   BREAST LUMPECTOMY     CHOLECYSTECTOMY     EXCISION OF BREAST BIOPSY Right 09/13/2023   Procedure: EXCISION OF BREAST BIOPSY WITH RADIOACTIVE SEED GUIDED LOCALIZATION;  Surgeon: Belinda Cough, MD;  Location: [facility];   Service: General;  Laterality: Right;  Right breast rsl excisional biopsy   HERNIA REPAIR     REPLACEMENT TOTAL KNEE Right    TONSILLECTOMY     TOTAL KNEE ARTHROPLASTY Left 07/15/2019   Procedure: TOTAL KNEE ARTHROPLASTY;  Surgeon: Rubie Kemps, MD;  Location: [facility];  Service: Orthopedics;  Laterality: Left;   TUBAL LIGATION      Family History  Problem Relation Age of Onset   Thyroid  disease Mother    Cancer Mother    Migraines Mother    Brain cancer Father    Throat cancer Father    Cancer - Other Father    Heart disease Maternal Grandmother  Diabetes Daughter    Social History   Socioeconomic History   Marital status: Married    Spouse name: Alm   Number of children: 3   Years of education: 12   Highest education level: Not on file  Occupational History   Occupation: Housewife  Tobacco Use   Smoking status: Former    Current packs/day: 0.00    Average packs/day: 2.0 packs/day for 12.0 years (24.0 ttl pk-yrs)    Types: Cigarettes    Start date: 05/25/1998    Quit date: 05/25/2010    Years since quitting: 13.9   Smokeless tobacco: Never   Tobacco comments:    Used to smoke 1-2 packs a days  Vaping Use   Vaping status: Never Used  Substance and Sexual Activity   Alcohol use: Not Currently    Comment: past occasionally   Drug use: Never   Sexual activity: Not Currently    Birth control/protection: Surgical    Comment: hyst  Other Topics Concern   Not on file  Social History Narrative   Born and raised in [place], [place]. Currently lives in a house with her husband. 1 dog. Fun: Garden, feed birds, swimming   Denies any religious beliefs effecting health care.    Social Drivers of Health   Tobacco Use: Medium Risk (04/27/2024)   Patient History    Smoking Tobacco Use: Former    Smokeless Tobacco Use: Never    Passive Exposure: Not on file  Financial Resource Strain: Low Risk (03/21/2024)   Received from [facility]   Overall Financial Resource Strain  (CARDIA)    How hard is it for you to pay for the very basics like food, housing, medical care, and heating?: Not hard at all  Food Insecurity: Low Risk (03/21/2024)   Received from [facility]   Epic    Within the past 12 months, you worried that your food would run out before you got money to buy more: Never true    Within the past 12 months, the food you bought just didn't last and you didn't have money to get more. : Never true  Recent Concern: Food Insecurity - Food Insecurity Present (02/01/2024)   Epic    Worried About Programme Researcher, Broadcasting/film/video in the Last Year: Sometimes true    Ran Out of Food in the Last Year: Sometimes true  Transportation Needs: No Transportation Needs (03/21/2024)   Received from [facility]    In the past 12 months, has lack of reliable transportation kept you from medical appointments, meetings, work or from getting things needed for daily living? : No  Physical Activity: Insufficiently Active (08/19/2023)   Exercise Vital Sign    Days of Exercise per Week: 4 days    Minutes of Exercise per Session: 20 min  Stress: No Stress Concern Present (09/28/2023)   [name] of Occupational Health - Occupational Stress Questionnaire    Feeling of Stress: Not at all  Recent Concern: Stress - Stress Concern Present (08/19/2023)   [name] of Occupational Health - Occupational Stress Questionnaire    Feeling of Stress : Rather much  Social Connections: Unknown (03/21/2024)   Received from [facility]   Social Connection and Isolation Panel    In a typical week, how many times do you talk on the phone with family, friends, or neighbors?: Three times a week    Frequency of Social Gatherings with Friends and Family: Not on file    Attends  Religious Services: Not on file    Active Member of Clubs or Organizations: Not on file    Attends Club or Organization Meetings: Not on file    Marital Status: Not on file  Depression (PHQ2-9): Low Risk  (04/26/2024)   Depression (PHQ2-9)    PHQ-2 Score: 4  Alcohol Screen: Low Risk (09/28/2023)   Alcohol Screen    Last Alcohol Screening Score (AUDIT): 0  Housing: Low Risk (03/21/2024)   Received from [facility]   Epic    What is your living situation today?: I have a steady place to live    Think about the place you live. Do you have problems with any of the following? Choose all that apply:: None/None on this list  Utilities: Low Risk (03/21/2024)   Received from [facility]   Utilities    In the past 12 months has the electric, gas, oil, or water  company threatened to shut off services in your home? : No  Health Literacy: Adequate Health Literacy (11/23/2023)   B1300 Health Literacy    Frequency of need for help with medical instructions: Never    Objective:  BP 128/72   Pulse 82   Temp 98 F (36.7 C)   Ht 5' 3 (1.6 m)   Wt 138 lb (62.6 kg)   SpO2 96%   BMI 24.45 kg/m      04/26/2024    8:08 AM 02/21/2024   10:53 AM 02/06/2024   11:08 AM  BP/Weight  Systolic BP 128 100 146  Diastolic BP 72 70 82  Wt. (Lbs) 138 169 168.4  BMI 24.45 kg/m2 29.94 kg/m2 29.83 kg/m2    Physical Exam Vitals reviewed.  Constitutional:      Appearance: Normal appearance. She is normal weight.  Neck:     Vascular: No carotid bruit.  Cardiovascular:     Rate and Rhythm: Normal rate and regular rhythm.     Pulses: Normal pulses.     Heart sounds: Normal heart sounds.  Pulmonary:     Effort: Pulmonary effort is normal. No respiratory distress.     Breath sounds: Normal breath sounds.  Abdominal:     General: Abdomen is flat. Bowel sounds are normal.     Palpations: Abdomen is soft.     Tenderness: There is no abdominal tenderness.  Neurological:     Mental Status: She is alert and oriented to person, place, and time.  Psychiatric:        Mood and Affect: Mood normal.        Behavior: Behavior normal.      Diabetic foot exam was performed with the following findings:   No  deformities, ulcerations, or other skin breakdown Normal sensation of 10g monofilament Intact posterior tibialis and dorsalis pedis pulses      Lab Results  Component Value Date   WBC 8.2 02/21/2024   HGB 14.2 02/21/2024   HCT 44.7 02/21/2024   PLT 246 02/21/2024   GLUCOSE 93 02/21/2024   CHOL 241 (H) 08/23/2023   TRIG 122 08/23/2023   HDL 61 08/23/2023   LDLCALC 158 (H) 08/23/2023   ALT 20 02/21/2024   AST 20 02/21/2024   NA 138 02/21/2024   K 5.2 02/21/2024   CL 101 02/21/2024   CREATININE 0.91 02/21/2024   BUN 15 02/21/2024   CO2 25 02/21/2024   TSH 0.966 08/23/2023   HGBA1C 5.6 04/26/2024    Results for orders placed or performed in visit on 04/26/24  POCT Lipid  Panel   Collection Time: 04/26/24  9:16 AM  Result Value Ref Range   TC 107    HDL 59    TRG 83    LDL 32    Non-HDL 48    TC/HDL    POCT glycosylated hemoglobin (Hb A1C)   Collection Time: 04/26/24  9:16 AM  Result Value Ref Range   Hemoglobin A1C     HbA1c POC (<> result, manual entry) 5.6 4.0 - 5.6 %   HbA1c, POC (prediabetic range)     HbA1c, POC (controlled diabetic range)    Microalbumin / creatinine urine ratio   Collection Time: 04/26/24  9:23 AM  Result Value Ref Range   Creatinine, Urine 21.3 Not Estab. mg/dL   Microalbumin, Urine <6.9 Not Estab. ug/mL   Microalb/Creat Ratio <14 0 - 29 mg/g creat  .  Assessment & Plan:   Assessment & Plan Essential hypertension, benign The current medical regimen is effective;  continue present plan and medications. Continue ramipril  2.5 mg daily.     Gastro-esophageal reflux disease without esophagitis - stop omeprazole  20 mg daily. - Monitor for recurrence of symptoms     Mixed hyperlipidemia Lipids at goal. Managed with Vascepa , zetia , and  and Crestor . - Order cholesterol panel.  Orders:   POCT Lipid Panel   Diabetic polyneuropathy associated with type 2 diabetes mellitus (HCC) Diabetes management stable with good blood sugar  control. Neuropathy symptoms persist despite pregabalin . - Continue pregabalin  300 mg twice daily for neuropathy. - Monitor blood sugar levels regularly. Orders:   POCT glycosylated hemoglobin (Hb A1C)   Microalbumin / creatinine urine ratio  Severe persistent asthma without complication (HCC) Breathing well-managed with Symbicort  and Airsupra . Nebulizer use questioned due to overlap with Airsupra . - Continue Symbicort  160-4.5 MCG/ACT inhalation twice daily. - Use Airsupra  as needed. - Consider reducing nebulizer use if symptoms remain controlled.    Mild recurrent major depression The current medical regimen is effective;  continue present plan and medications. Continue trintellix  and buspirone .       Malignant neoplasm of upper-outer quadrant of right breast in female, estrogen receptor positive (HCC) Under treatment with arimedex    Primary insomnia Fair sleep.  - continue trazodone , amitriptyline , and lunesta .  - Discontinue mirtazapine  due to ineffectiveness and potential for weight gain.         Body mass index is 24.45 kg/m.     No orders of the defined types were placed in this encounter.   Orders Placed This Encounter  Procedures   Microalbumin / creatinine urine ratio   POCT Lipid Panel   POCT glycosylated hemoglobin (Hb A1C)       Follow-up: Return in about 3 months (around 07/25/2024).  An After Visit Summary was printed and given to the patient.  Abigail Free, MD [facility] [phone]     [1]  Current Outpatient Medications on File Prior to Visit  Medication Sig Dispense Refill   acetaminophen  (TYLENOL ) 500 MG tablet Take 500 mg by mouth.     Albuterol -Budesonide  (AIRSUPRA ) 90-80 MCG/ACT AERO Inhale 2 puffs into the lungs 4 (four) times daily as needed. (Patient taking differently: Inhale 2 puffs into the lungs every 4 (four) hours as needed (shortness/wheezing).) 10.7 g 3   amitriptyline  (ELAVIL ) 25 MG tablet TAKE 1 TABLET BY  MOUTH EVERY EVENING 30 tablet 5   anastrozole  (ARIMIDEX ) 1 MG tablet Take 1 tablet (1 mg total) by mouth daily. 30 tablet 5   ASPIRIN  81 PO Take  81 mg by mouth daily.     Blood Glucose Monitoring Suppl DEVI 1 each by Does not apply route as directed. Dispense based on patient and insurance preference. Use up to four times daily as directed. (FOR ICD-10 E10.9, E11.9). 1 each 0   busPIRone  (BUSPAR ) 15 MG tablet Take 1 tablet (15 mg total) by mouth 2 (two) times daily. 180 tablet 1   Eszopiclone  3 MG TABS Take 3 mg by mouth at bedtime. Take immediately before bedtime     ezetimibe  (ZETIA ) 10 MG tablet TAKE 1 TABLET BY MOUTH EVERY DAY 90 tablet 1   fluticasone  (FLONASE ) 50 MCG/ACT nasal spray SPRAY 2 SPRAYS INTO EACH NOSTRIL EVERY DAY 48 mL 2   Glucose Blood (BLOOD GLUCOSE TEST STRIPS) STRP 1 each by Does not apply route as directed. Dispense based on patient and insurance preference. Use up to four times daily as directed. (FOR ICD-10 E10.9, E11.9). 100 strip 0   glucose blood (ONETOUCH VERIO) test strip E11.42 Use new test strip each time when checking FBS 100 each 2   Lancet Device MISC 1 each by Does not apply route as directed. Dispense based on patient and insurance preference. Use up to four times daily as directed. (FOR ICD-10 E10.9, E11.9). 1 each 0   Lancets (ONETOUCH DELICA PLUS LANCET33G) MISC E 11.42 Use new lancet each time when checking FBS 100 each 2   Lancets MISC 1 each by Does not apply route as directed. Dispense based on patient and insurance preference. Use up to four times daily as directed. (FOR ICD-10 E10.9, E11.9). 100 each 0   metoCLOPramide  (REGLAN ) 10 MG tablet Take 10 mg by mouth 4 (four) times daily.     montelukast (SINGULAIR) 10 MG tablet Take 10 mg by mouth at bedtime.     Multiple Vitamin (MULTIVITAMIN WITH MINERALS) TABS tablet Take 1 tablet by mouth daily.     ondansetron  (ZOFRAN -ODT) 4 MG disintegrating tablet Take 1 tablet (4 mg total) by mouth every 6 (six) hours  as needed for nausea or vomiting. 30 tablet 1   pregabalin  (LYRICA ) 300 MG capsule Take 1 capsule (300 mg total) by mouth 2 (two) times daily. 60 capsule 2   ramipril  (ALTACE ) 2.5 MG capsule TAKE 1 CAPSULE BY MOUTH EVERY DAY 90 capsule 1   rOPINIRole  (REQUIP ) 3 MG tablet Take 1 tablet (3 mg total) by mouth daily. 90 tablet 0   rosuvastatin  (CRESTOR ) 40 MG tablet Take 1 tablet (40 mg total) by mouth daily. 90 tablet 0   solifenacin  (VESICARE ) 5 MG tablet Take 1 tablet (5 mg total) by mouth daily. 90 tablet 0   SYMBICORT  160-4.5 MCG/ACT inhaler INHALE 2 PUFFS INTO THE LUNGS TWICE A DAY 10.2 each 3   topiramate  (TOPAMAX ) 50 MG tablet Take 1 tablet (50 mg total) by mouth daily. 90 tablet 0   traZODone  (DESYREL ) 50 MG tablet TAKE 1 TABLET BY MOUTH EVERYDAY AT BEDTIME 90 tablet 0   vortioxetine  HBr (TRINTELLIX ) 20 MG TABS tablet Take 1 tablet (20 mg total) by mouth daily. 90 tablet 0   No current facility-administered medications on file prior to visit.   "

## 2024-04-26 NOTE — Assessment & Plan Note (Signed)
 SABRA

## 2024-04-26 NOTE — Assessment & Plan Note (Signed)
 Lipids at goal. Managed with Vascepa , zetia , and  and Crestor . - Order cholesterol panel.  Orders:   POCT Lipid Panel

## 2024-04-26 NOTE — Assessment & Plan Note (Signed)
 Orders:    POCT glycosylated hemoglobin (Hb A1C)

## 2024-04-27 ENCOUNTER — Encounter: Payer: Self-pay | Admitting: Family Medicine

## 2024-04-27 DIAGNOSIS — J4550 Severe persistent asthma, uncomplicated: Secondary | ICD-10-CM | POA: Insufficient documentation

## 2024-04-27 LAB — MICROALBUMIN / CREATININE URINE RATIO
Creatinine, Urine: 21.3 mg/dL
Microalb/Creat Ratio: <14 mg/g{creat} (ref 0–29)
Microalbumin, Urine: <3 ug/mL

## 2024-04-27 NOTE — Assessment & Plan Note (Signed)
 Breathing well-managed with Symbicort  and Airsupra . Nebulizer use questioned due to overlap with Airsupra . - Continue Symbicort  160-4.5 MCG/ACT inhalation twice daily. - Use Airsupra  as needed. - Consider reducing nebulizer use if symptoms remain controlled.

## 2024-04-27 NOTE — Assessment & Plan Note (Signed)
 Fair sleep.  - continue trazodone , amitriptyline , and lunesta .  - Discontinue mirtazapine  due to ineffectiveness and potential for weight gain.

## 2024-04-27 NOTE — Assessment & Plan Note (Signed)
 The current medical regimen is effective;  continue present plan and medications. Continue trintellix  and buspirone .

## 2024-04-27 NOTE — Assessment & Plan Note (Signed)
 Under treatment with arimedex

## 2024-06-05 ENCOUNTER — Inpatient Hospital Stay: Admitting: Oncology

## 2024-08-02 ENCOUNTER — Ambulatory Visit: Admitting: Family Medicine
